# Patient Record
Sex: Female | Born: 1940
Health system: Southern US, Community
[De-identification: ages and names within clinical notes are randomized; demographics above are authoritative.]

## PROBLEM LIST (undated history)

## (undated) DIAGNOSIS — K219 Gastro-esophageal reflux disease without esophagitis: Secondary | ICD-10-CM

## (undated) DIAGNOSIS — E785 Hyperlipidemia, unspecified: Secondary | ICD-10-CM

## (undated) DIAGNOSIS — K223 Perforation of esophagus: Secondary | ICD-10-CM

## (undated) DIAGNOSIS — K760 Fatty (change of) liver, not elsewhere classified: Secondary | ICD-10-CM

## (undated) DIAGNOSIS — I10 Essential (primary) hypertension: Secondary | ICD-10-CM

## (undated) DIAGNOSIS — U071 COVID-19: Secondary | ICD-10-CM

## (undated) DIAGNOSIS — T753XXA Motion sickness, initial encounter: Secondary | ICD-10-CM

## (undated) DIAGNOSIS — K635 Polyp of colon: Secondary | ICD-10-CM

## (undated) DIAGNOSIS — T7840XA Allergy, unspecified, initial encounter: Secondary | ICD-10-CM

## (undated) DIAGNOSIS — J45909 Unspecified asthma, uncomplicated: Secondary | ICD-10-CM

## (undated) DIAGNOSIS — M1990 Unspecified osteoarthritis, unspecified site: Secondary | ICD-10-CM

## (undated) HISTORY — PX: TONSILLECTOMY AND ADENOIDECTOMY: SHX28

## (undated) HISTORY — DX: Polyp of colon: K63.5

## (undated) HISTORY — DX: Hyperlipidemia, unspecified: E78.5

## (undated) HISTORY — DX: COVID-19: U07.1

## (undated) HISTORY — DX: Allergy, unspecified, initial encounter: T78.40XA

## (undated) HISTORY — DX: Unspecified asthma, uncomplicated: J45.909

## (undated) HISTORY — DX: Essential (primary) hypertension: I10

## (undated) HISTORY — DX: Gastro-esophageal reflux disease without esophagitis: K21.9

---

## 1976-05-08 HISTORY — PX: ABDOMINAL HYSTERECTOMY: SHX81

## 2002-05-28 LAB — HM PAP SMEAR

## 2004-02-21 ENCOUNTER — Emergency Department: Payer: Self-pay | Admitting: Emergency Medicine

## 2005-01-07 ENCOUNTER — Emergency Department: Payer: Self-pay | Admitting: Emergency Medicine

## 2005-04-03 ENCOUNTER — Ambulatory Visit: Payer: Self-pay | Admitting: General Surgery

## 2005-04-03 LAB — HM COLONOSCOPY

## 2005-07-08 ENCOUNTER — Emergency Department: Payer: Self-pay | Admitting: Emergency Medicine

## 2005-07-08 ENCOUNTER — Other Ambulatory Visit: Payer: Self-pay

## 2005-12-14 ENCOUNTER — Emergency Department: Payer: Self-pay | Admitting: Emergency Medicine

## 2007-06-09 ENCOUNTER — Emergency Department: Payer: Self-pay | Admitting: Emergency Medicine

## 2007-06-22 ENCOUNTER — Emergency Department: Payer: Self-pay | Admitting: Internal Medicine

## 2007-12-10 ENCOUNTER — Ambulatory Visit: Payer: Self-pay | Admitting: Family Medicine

## 2008-08-28 ENCOUNTER — Emergency Department: Payer: Self-pay | Admitting: Emergency Medicine

## 2009-03-25 ENCOUNTER — Ambulatory Visit: Payer: Self-pay | Admitting: Family Medicine

## 2009-04-13 ENCOUNTER — Ambulatory Visit: Payer: Self-pay | Admitting: Family Medicine

## 2010-02-18 ENCOUNTER — Emergency Department: Payer: Self-pay | Admitting: Emergency Medicine

## 2010-03-01 LAB — HM DEXA SCAN: HM Dexa Scan: NORMAL

## 2010-03-27 ENCOUNTER — Emergency Department: Payer: Self-pay | Admitting: Emergency Medicine

## 2010-04-06 ENCOUNTER — Ambulatory Visit: Payer: Self-pay | Admitting: Unknown Physician Specialty

## 2010-05-05 ENCOUNTER — Ambulatory Visit: Payer: Self-pay

## 2010-07-04 ENCOUNTER — Emergency Department: Payer: Self-pay | Admitting: Emergency Medicine

## 2011-02-10 ENCOUNTER — Emergency Department: Payer: Self-pay | Admitting: Emergency Medicine

## 2011-02-25 ENCOUNTER — Emergency Department: Payer: Self-pay | Admitting: Emergency Medicine

## 2011-05-31 ENCOUNTER — Ambulatory Visit: Payer: Self-pay | Admitting: Family Medicine

## 2011-06-07 ENCOUNTER — Ambulatory Visit: Payer: Self-pay | Admitting: Family Medicine

## 2011-07-20 ENCOUNTER — Ambulatory Visit: Payer: Self-pay | Admitting: Unknown Physician Specialty

## 2012-08-06 ENCOUNTER — Ambulatory Visit: Payer: Self-pay | Admitting: Family Medicine

## 2013-12-17 ENCOUNTER — Ambulatory Visit: Payer: Self-pay | Admitting: Family Medicine

## 2013-12-17 LAB — HM MAMMOGRAPHY

## 2014-01-02 LAB — CBC AND DIFFERENTIAL
HEMATOCRIT: 39 % (ref 36–46)
Hemoglobin: 12.8 g/dL (ref 12.0–16.0)
PLATELETS: 101 10*3/uL — AB (ref 150–399)
WBC: 3.8 10*3/mL

## 2014-01-02 LAB — BASIC METABOLIC PANEL
BUN: 11 mg/dL (ref 4–21)
Creatinine: 0.7 mg/dL (ref 0.5–1.1)
Glucose: 158 mg/dL
POTASSIUM: 3.9 mmol/L (ref 3.4–5.3)
Sodium: 141 mmol/L (ref 137–147)

## 2014-01-02 LAB — HEMOGLOBIN A1C: Hgb A1c MFr Bld: 7.1 % — AB (ref 4.0–6.0)

## 2014-01-08 ENCOUNTER — Ambulatory Visit: Payer: Self-pay | Admitting: Family Medicine

## 2014-07-13 DIAGNOSIS — E785 Hyperlipidemia, unspecified: Secondary | ICD-10-CM | POA: Diagnosis not present

## 2014-07-13 DIAGNOSIS — D696 Thrombocytopenia, unspecified: Secondary | ICD-10-CM | POA: Diagnosis not present

## 2014-07-13 DIAGNOSIS — Z23 Encounter for immunization: Secondary | ICD-10-CM | POA: Diagnosis not present

## 2014-07-13 DIAGNOSIS — E119 Type 2 diabetes mellitus without complications: Secondary | ICD-10-CM | POA: Diagnosis not present

## 2014-07-13 DIAGNOSIS — K5781 Diverticulitis of intestine, part unspecified, with perforation and abscess with bleeding: Secondary | ICD-10-CM | POA: Diagnosis not present

## 2014-07-13 DIAGNOSIS — I10 Essential (primary) hypertension: Secondary | ICD-10-CM | POA: Diagnosis not present

## 2014-07-13 LAB — LIPID PANEL
CHOLESTEROL: 188 mg/dL (ref 0–200)
HDL: 38 mg/dL (ref 35–70)
LDL CALC: 127 mg/dL
TRIGLYCERIDES: 115 mg/dL (ref 40–160)

## 2014-07-13 LAB — HM DIABETES FOOT EXAM: HM DIABETIC FOOT EXAM: NORMAL

## 2014-07-13 LAB — BASIC METABOLIC PANEL
BUN: 12 mg/dL (ref 4–21)
Creatinine: 0.6 mg/dL (ref 0.5–1.1)
Glucose: 143 mg/dL
Potassium: 4.3 mmol/L (ref 3.4–5.3)
SODIUM: 140 mmol/L (ref 137–147)

## 2014-07-13 LAB — HEPATIC FUNCTION PANEL
ALT: 26 U/L (ref 7–35)
AST: 27 U/L (ref 13–35)

## 2014-07-13 LAB — CBC AND DIFFERENTIAL
HEMATOCRIT: 40 % (ref 36–46)
HEMOGLOBIN: 13.1 g/dL (ref 12.0–16.0)
PLATELETS: 111 10*3/uL — AB (ref 150–399)
WBC: 4 10*3/mL

## 2014-07-13 LAB — HEMOGLOBIN A1C: Hgb A1c MFr Bld: 7 % — AB (ref 4.0–6.0)

## 2014-09-22 DIAGNOSIS — N390 Urinary tract infection, site not specified: Secondary | ICD-10-CM | POA: Diagnosis not present

## 2014-09-22 DIAGNOSIS — K5781 Diverticulitis of intestine, part unspecified, with perforation and abscess with bleeding: Secondary | ICD-10-CM | POA: Diagnosis not present

## 2014-09-22 DIAGNOSIS — Z23 Encounter for immunization: Secondary | ICD-10-CM | POA: Diagnosis not present

## 2014-09-22 DIAGNOSIS — I10 Essential (primary) hypertension: Secondary | ICD-10-CM | POA: Diagnosis not present

## 2014-10-14 ENCOUNTER — Ambulatory Visit (INDEPENDENT_AMBULATORY_CARE_PROVIDER_SITE_OTHER): Payer: Commercial Managed Care - HMO | Admitting: Family Medicine

## 2014-10-14 ENCOUNTER — Ambulatory Visit
Admission: RE | Admit: 2014-10-14 | Discharge: 2014-10-14 | Disposition: A | Discharge status: Home / Self Care | Payer: Commercial Managed Care - HMO | Source: Ambulatory Visit | Attending: Family Medicine | Admitting: Family Medicine

## 2014-10-14 ENCOUNTER — Encounter: Payer: Self-pay | Admitting: Family Medicine

## 2014-10-14 ENCOUNTER — Telehealth: Payer: Self-pay

## 2014-10-14 VITALS — BP 118/72 | HR 80 | Temp 97.8°F | Resp 20 | Ht 61.75 in | Wt 182.0 lb

## 2014-10-14 DIAGNOSIS — E669 Obesity, unspecified: Secondary | ICD-10-CM | POA: Insufficient documentation

## 2014-10-14 DIAGNOSIS — M549 Dorsalgia, unspecified: Secondary | ICD-10-CM | POA: Insufficient documentation

## 2014-10-14 DIAGNOSIS — M25552 Pain in left hip: Secondary | ICD-10-CM | POA: Insufficient documentation

## 2014-10-14 DIAGNOSIS — J45909 Unspecified asthma, uncomplicated: Secondary | ICD-10-CM | POA: Insufficient documentation

## 2014-10-14 DIAGNOSIS — R740 Nonspecific elevation of levels of transaminase and lactic acid dehydrogenase [LDH]: Secondary | ICD-10-CM

## 2014-10-14 DIAGNOSIS — M25569 Pain in unspecified knee: Secondary | ICD-10-CM | POA: Insufficient documentation

## 2014-10-14 DIAGNOSIS — M5442 Lumbago with sciatica, left side: Secondary | ICD-10-CM

## 2014-10-14 DIAGNOSIS — IMO0002 Reserved for concepts with insufficient information to code with codable children: Secondary | ICD-10-CM | POA: Insufficient documentation

## 2014-10-14 DIAGNOSIS — D219 Benign neoplasm of connective and other soft tissue, unspecified: Secondary | ICD-10-CM | POA: Insufficient documentation

## 2014-10-14 DIAGNOSIS — K7581 Nonalcoholic steatohepatitis (NASH): Secondary | ICD-10-CM | POA: Insufficient documentation

## 2014-10-14 DIAGNOSIS — E785 Hyperlipidemia, unspecified: Secondary | ICD-10-CM | POA: Insufficient documentation

## 2014-10-14 DIAGNOSIS — K2100 Gastro-esophageal reflux disease with esophagitis, without bleeding: Secondary | ICD-10-CM

## 2014-10-14 DIAGNOSIS — G510 Bell's palsy: Secondary | ICD-10-CM | POA: Insufficient documentation

## 2014-10-14 DIAGNOSIS — K8020 Calculus of gallbladder without cholecystitis without obstruction: Secondary | ICD-10-CM | POA: Insufficient documentation

## 2014-10-14 DIAGNOSIS — E118 Type 2 diabetes mellitus with unspecified complications: Secondary | ICD-10-CM | POA: Insufficient documentation

## 2014-10-14 DIAGNOSIS — E559 Vitamin D deficiency, unspecified: Secondary | ICD-10-CM | POA: Insufficient documentation

## 2014-10-14 DIAGNOSIS — E119 Type 2 diabetes mellitus without complications: Secondary | ICD-10-CM

## 2014-10-14 DIAGNOSIS — R161 Splenomegaly, not elsewhere classified: Secondary | ICD-10-CM | POA: Insufficient documentation

## 2014-10-14 DIAGNOSIS — I10 Essential (primary) hypertension: Secondary | ICD-10-CM | POA: Insufficient documentation

## 2014-10-14 DIAGNOSIS — J309 Allergic rhinitis, unspecified: Secondary | ICD-10-CM | POA: Insufficient documentation

## 2014-10-14 MED ORDER — TRAMADOL HCL 50 MG PO TABS: 50.0000 mg | ORAL_TABLET | Freq: Four times a day (QID) | ORAL | Status: DC | PRN

## 2014-10-14 MED ORDER — PREDNISONE 10 MG PO TABS: 10.0000 mg | ORAL_TABLET | Freq: Every day | ORAL | Status: DC

## 2014-10-14 MED ORDER — OMEPRAZOLE 20 MG PO CPDR: 20.0000 mg | DELAYED_RELEASE_CAPSULE | Freq: Two times a day (BID) | ORAL | Status: DC

## 2014-10-14 MED ORDER — VITAMIN D (ERGOCALCIFEROL) 1.25 MG (50000 UNIT) PO CAPS: 50000.0000 [IU] | ORAL_CAPSULE | ORAL | Status: DC

## 2014-10-14 NOTE — Telephone Encounter (Signed)
-----   Message from Margarita Rana, MD sent at 10/14/2014 12:10 PM EDT ----- Normal hip Xray. Proceed with plan as discussed. Call if worsens or does not improve. Thanks.

## 2014-10-14 NOTE — Progress Notes (Deleted)
   Subjective:    Patient ID: Jennifer [name], female    DOB: July 30, 1940, 74 y.o.   MRN: [id number]  HPI    Review of Systems     Objective:   Physical Exam        Assessment & Plan:

## 2014-10-14 NOTE — Progress Notes (Signed)
Subjective:    Patient ID: Jennifer [name], female    DOB: 01-06-1941, 74 y.o.   MRN: [id number]  Hip Pain  There was no injury mechanism. The pain is present in the left hip. The quality of the pain is described as aching. The pain is at a severity of 10/10. The pain is moderate. The pain has been constant since onset. Associated symptoms include an inability to bear weight. Pertinent negatives include no loss of motion, loss of sensation, muscle weakness, numbness or tingling. Nothing aggravates the symptoms. She has tried heat and rest for the symptoms. The treatment provided no relief.   Really thinks the head made it worse.  Just hurts so bad.  Did try one of son's Tramadol. Did help.  Vitamin D Deficiency Patient is requesting an increase of her prescription of Vit D 50000 iu, pt reports that she is currently taking 2 capsules every month. Patient reports that she a test done and was told to increase her intake of Vitamin D.    Review of Systems  Constitutional: Negative.   Neurological: Negative for tingling and numbness.  Psychiatric/Behavioral: Negative.    Past Medical History  Diagnosis Date  . Allergy   . Asthma   . Diabetes mellitus without complication   . Hypertension   . Hyperlipidemia   . GERD (gastroesophageal reflux disease)    Past Surgical History  Procedure Laterality Date  . Tonsillectomy and adenoidectomy    . Abdominal hysterectomy  1978    reports that she quit smoking about 27 years ago. Her smoking use included Cigarettes. She has a 3 pack-year smoking history. She has never used smokeless tobacco. She reports that she does not drink alcohol or use illicit drugs. family history includes Alcohol abuse in her father; Cancer in her sister; Dementia in her mother; Lymphoma in her sister and sister. Allergies  Allergen Reactions  . Celecoxib   . Codeine   . Hydrocodone-Acetaminophen   . Nitrofurantoin Monohyd Macro   . Penicillins   . Sulfa Antibiotics    . Ciprofloxacin Rash   Blood pressure 118/72, pulse 80, temperature 97.8 F (36.6 C), resp. rate 20, height 5' 1.75" (1.568 m), weight 182 lb (82.555 kg).    Objective:   Physical Exam  Constitutional: She appears well-developed and well-nourished.  Cardiovascular: Normal rate and regular rhythm.   Pulmonary/Chest: Effort normal and breath sounds normal.  Musculoskeletal: Normal range of motion. She exhibits no tenderness (straight leg raise is negative).       Left hip: She exhibits normal range of motion, normal strength and no tenderness.          Assessment & Plan:   1. Esophagitis, reflux    Worsening. Patient advised to increase Omeprazole 20 mg as below. - omeprazole (PRILOSEC) 20 MG capsule; Take 1 capsule (20 mg total) by mouth 2 (two) times daily.  Dispense: 60 capsule; Refill: 5  2. Avitaminosis D    Not to goal. Patient advised to increase Vitamin D 50000 units as below. - Vitamin D, Ergocalciferol, (DRISDOL) 50000 UNITS CAPS capsule; Take 1 capsule (50,000 Units total) by mouth once a week.  Dispense: 12 capsule; Refill: 3  3. Left-sided low back pain with left-sided sciatica    New problem. Patient advised to start Prednisone 10 mg as below and Tramadol 50 mg as needed. Patient advised to call in pain or symptoms are worsening or not improving.  - DG Left hip with pelvis x-ray - traMADol (ULTRAM)  50 MG tablet; Take 1 tablet (50 mg total) by mouth every 6 (six) hours as needed.  Dispense: 30 tablet; Refill: 1 - prednisone (DELTASONE) 10 MG tablet; Take 1 tablet (10 mg total) by mouth daily with breakfast. 6 tablets for 2 days; 5 tablets for 2 days; 4 tablets for 2 days; 3 tablets for 2 days; 2 tablets for 2 days; 1 tablet for 2 days  Dispense: 42 tablet; Refill: 0  Patient seen and examined by Dr. Jerrell Belfast, and note scribed by Philbert Riser. Dimas, CMA.  I have reviewed the document for accuracy and completeness and I agree with above. Jerrell Belfast, MD

## 2014-10-14 NOTE — Telephone Encounter (Signed)
Pt advised as directed below.    Thanks,   -Mickel Baas

## 2014-10-21 ENCOUNTER — Telehealth: Payer: Self-pay | Admitting: Family Medicine

## 2014-10-21 DIAGNOSIS — M1990 Unspecified osteoarthritis, unspecified site: Secondary | ICD-10-CM

## 2014-10-21 NOTE — Telephone Encounter (Signed)
Pt is requesting referral to see Dr Precious Reel for leg and knee pain.She states she spoke with you about this at last office visit

## 2014-10-23 NOTE — Telephone Encounter (Signed)
Information faxed to Dr Precious Reel.His office will contact pt

## 2014-12-02 DIAGNOSIS — R7989 Other specified abnormal findings of blood chemistry: Secondary | ICD-10-CM | POA: Diagnosis not present

## 2014-12-02 DIAGNOSIS — G8929 Other chronic pain: Secondary | ICD-10-CM | POA: Diagnosis not present

## 2014-12-02 DIAGNOSIS — M25562 Pain in left knee: Secondary | ICD-10-CM | POA: Diagnosis not present

## 2014-12-02 DIAGNOSIS — M1990 Unspecified osteoarthritis, unspecified site: Secondary | ICD-10-CM | POA: Diagnosis not present

## 2014-12-25 ENCOUNTER — Other Ambulatory Visit: Payer: Self-pay | Admitting: Family Medicine

## 2014-12-25 ENCOUNTER — Other Ambulatory Visit: Payer: Self-pay

## 2014-12-25 DIAGNOSIS — E119 Type 2 diabetes mellitus without complications: Secondary | ICD-10-CM

## 2014-12-25 NOTE — Telephone Encounter (Signed)
Pt contacted office for refill request on the following medications:  glucose blood (ACCU-CHEK SMARTVIEW) test strip.  Pt states she is testing 2 times a day and is requesting additional test strips.  Sorrel.  CB#[phone]/MW

## 2014-12-25 NOTE — Telephone Encounter (Signed)
This is a pt of Dr. Sharyon Medicus. Next ov appointment is on 01/18/2015.  Thanks,

## 2014-12-28 MED ORDER — GLUCOSE BLOOD VI STRP: ORAL_STRIP | Status: DC

## 2014-12-28 NOTE — Telephone Encounter (Signed)
Please let patient know that insurance will not pay for two times daily unless on insulin. She will have to pay out of pocket for it. Does she still want it sent that way. Thanks.

## 2014-12-28 NOTE — Telephone Encounter (Signed)
Pt advised she would like to go ahead with the refill.  Thanks,   -Mickel Baas

## 2014-12-28 NOTE — Telephone Encounter (Signed)
Pt called to make sure this was sent to the pharmacy/MW

## 2014-12-29 MED ORDER — ACCU-CHEK NANO SMARTVIEW W/DEVICE KIT: 1.0000 | PACK | Freq: Every day | Status: DC

## 2014-12-31 ENCOUNTER — Ambulatory Visit (INDEPENDENT_AMBULATORY_CARE_PROVIDER_SITE_OTHER): Payer: Commercial Managed Care - HMO | Admitting: Family Medicine

## 2014-12-31 ENCOUNTER — Other Ambulatory Visit: Payer: Self-pay | Admitting: Family Medicine

## 2014-12-31 ENCOUNTER — Encounter: Payer: Self-pay | Admitting: Family Medicine

## 2014-12-31 VITALS — BP 120/66 | HR 80 | Temp 98.3°F | Resp 16 | Ht 62.0 in | Wt 183.0 lb

## 2014-12-31 DIAGNOSIS — N3001 Acute cystitis with hematuria: Secondary | ICD-10-CM | POA: Diagnosis not present

## 2014-12-31 DIAGNOSIS — N3000 Acute cystitis without hematuria: Secondary | ICD-10-CM | POA: Insufficient documentation

## 2014-12-31 DIAGNOSIS — M5442 Lumbago with sciatica, left side: Secondary | ICD-10-CM | POA: Diagnosis not present

## 2014-12-31 DIAGNOSIS — M1990 Unspecified osteoarthritis, unspecified site: Secondary | ICD-10-CM | POA: Diagnosis not present

## 2014-12-31 LAB — POCT URINALYSIS DIPSTICK
BILIRUBIN UA: NEGATIVE
GLUCOSE UA: NEGATIVE
KETONES UA: NEGATIVE
Nitrite, UA: NEGATIVE
Protein, UA: NEGATIVE
Spec Grav, UA: 1.01
Urobilinogen, UA: 0.2
pH, UA: 6

## 2014-12-31 MED ORDER — CIPROFLOXACIN HCL 250 MG PO TABS: 250.0000 mg | ORAL_TABLET | Freq: Two times a day (BID) | ORAL | Status: DC

## 2014-12-31 MED ORDER — TRAMADOL HCL 50 MG PO TABS: 50.0000 mg | ORAL_TABLET | Freq: Four times a day (QID) | ORAL | Status: DC | PRN

## 2014-12-31 MED ORDER — CIPROFLOXACIN HCL 500 MG PO TABS: 500.0000 mg | ORAL_TABLET | Freq: Two times a day (BID) | ORAL | Status: DC

## 2014-12-31 NOTE — Progress Notes (Signed)
Patient ID: Jennifer [name], female   DOB: 1940/12/31, 74 y.o.   MRN: [id number]        Patient: Jennifer [name] Female    DOB: 1940/06/13   74 y.o.   MRN: [id number] Visit Date: 12/31/2014  Today's Provider: Margarita Rana, MD   Chief Complaint  Patient presents with  . Urinary Tract Infection   Subjective:    Urinary Tract Infection  This is a new problem. The current episode started in the past 7 days. The problem occurs every urination. The problem has been unchanged. There has been no fever. Associated symptoms include chills, flank pain (yesterday), frequency and urgency. Pertinent negatives include no discharge, hematuria, nausea or vomiting. She has tried nothing for the symptoms.   Results for orders placed or performed in visit on 12/31/14  POCT urinalysis dipstick  Result Value Ref Range   Color, UA straw    Clarity, UA clear    Glucose, UA neg    Bilirubin, UA neg    Ketones, UA neg    Spec Grav, UA 1.010    Blood, UA non hemo trace    pH, UA 6.0    Protein, UA neg    Urobilinogen, UA 0.2    Nitrite, UA neg    Leukocytes, UA moderate (2+) (A) Negative        Allergies  Allergen Reactions  . Celecoxib   . Codeine   . Hydrocodone-Acetaminophen   . Nitrofurantoin Monohyd Macro   . Penicillins   . Sulfa Antibiotics   . Ciprofloxacin Rash   Previous Medications   ALCOHOL SWABS (B-D SINGLE USE SWABS REGULAR) PADS       BLOOD GLUCOSE CALIBRATION (ACCU-CHEK SMARTVIEW CONTROL) LIQD       BLOOD GLUCOSE MONITORING SUPPL (ACCU-CHEK NANO SMARTVIEW) W/DEVICE KIT    1 strip by Does not apply route daily.   GLUCOSE BLOOD (ACCU-CHEK SMARTVIEW) TEST STRIP    To check blood sugar twice a day.   LANCETS MISC. (ACCU-CHEK FASTCLIX LANCET) KIT       METFORMIN (GLUCOPHAGE) 500 MG TABLET    Take 500 mg by mouth 2 (two) times daily with a meal.    OMEPRAZOLE (PRILOSEC) 20 MG CAPSULE    Take 1 capsule (20 mg total) by mouth 2 (two) times daily.   QUINAPRIL (ACCUPRIL) 10 MG TABLET     Take 10 mg by mouth daily.    TRAMADOL (ULTRAM) 50 MG TABLET    Take 1 tablet (50 mg total) by mouth every 6 (six) hours as needed.   VITAMIN D, ERGOCALCIFEROL, (DRISDOL) 50000 UNITS CAPS CAPSULE    Take 1 capsule (50,000 Units total) by mouth once a week.    Review of Systems  Constitutional: Positive for chills.  Gastrointestinal: Negative for nausea and vomiting.  Genitourinary: Positive for urgency, frequency and flank pain (yesterday). Negative for hematuria.    Social History  Substance Use Topics  . Smoking status: Former Smoker -- 0.50 packs/day for 6 years    Types: Cigarettes    Quit date: 05/08/1987  . Smokeless tobacco: Never Used  . Alcohol Use: No   Objective:   BP 120/66 mmHg  Pulse 80  Temp(Src) 98.3 F (36.8 C) (Oral)  Resp 16  Ht 5' 2"  (1.575 m)  Wt 183 lb (83.008 kg)  BMI 33.46 kg/m2  Physical Exam  Constitutional: She appears well-developed and well-nourished. No distress.  Cardiovascular: Normal rate.   Pulmonary/Chest: Breath sounds normal.  Abdominal: Soft. Bowel sounds  are normal. She exhibits no distension and no mass. There is no rebound, no guarding and no CVA tenderness.      Assessment & Plan:     1. Acute cystitis with hematuria Worsening Will treat. Request Cipro. Says she is no longer allergic to medication. Would like to retry it.  Patient instructed to call back if condition worsens or does not improve.    - POCT urinalysis dipstick - Urine culture  2. Arthritis Condition is worsening. Will start medication for better control.   - traMADol (ULTRAM) 50 MG tablet; Take 1 tablet (50 mg total) by mouth every 6 (six) hours as needed.  Dispense: 30 tablet; Refill: [place], MD  [facility]

## 2015-01-01 LAB — PLEASE NOTE

## 2015-01-01 LAB — URINE CULTURE

## 2015-01-05 ENCOUNTER — Telehealth: Payer: Self-pay

## 2015-01-05 NOTE — Telephone Encounter (Signed)
-----   Message from Margarita Rana, MD sent at 01/01/2015  4:55 PM EDT ----- Berderline for infection. Please see how patient is doing. Thanks.

## 2015-01-05 NOTE — Telephone Encounter (Signed)
Pt advised, she reports that she is feeling better.     Thanks,   -Mickel Baas

## 2015-01-18 ENCOUNTER — Ambulatory Visit (INDEPENDENT_AMBULATORY_CARE_PROVIDER_SITE_OTHER): Payer: Commercial Managed Care - HMO | Admitting: Family Medicine

## 2015-01-18 ENCOUNTER — Other Ambulatory Visit: Payer: Self-pay

## 2015-01-18 ENCOUNTER — Telehealth: Payer: Self-pay | Admitting: Gastroenterology

## 2015-01-18 ENCOUNTER — Encounter: Payer: Self-pay | Admitting: Family Medicine

## 2015-01-18 VITALS — BP 120/60 | HR 72 | Temp 97.8°F | Resp 16 | Ht 62.0 in | Wt 182.0 lb

## 2015-01-18 DIAGNOSIS — I10 Essential (primary) hypertension: Secondary | ICD-10-CM

## 2015-01-18 DIAGNOSIS — E785 Hyperlipidemia, unspecified: Secondary | ICD-10-CM | POA: Diagnosis not present

## 2015-01-18 DIAGNOSIS — Z13820 Encounter for screening for osteoporosis: Secondary | ICD-10-CM | POA: Diagnosis not present

## 2015-01-18 DIAGNOSIS — Z23 Encounter for immunization: Secondary | ICD-10-CM | POA: Diagnosis not present

## 2015-01-18 DIAGNOSIS — E669 Obesity, unspecified: Secondary | ICD-10-CM

## 2015-01-18 DIAGNOSIS — E559 Vitamin D deficiency, unspecified: Secondary | ICD-10-CM | POA: Diagnosis not present

## 2015-01-18 DIAGNOSIS — E119 Type 2 diabetes mellitus without complications: Secondary | ICD-10-CM | POA: Diagnosis not present

## 2015-01-18 DIAGNOSIS — Z0000 Encounter for general adult medical examination without abnormal findings: Secondary | ICD-10-CM

## 2015-01-18 DIAGNOSIS — Z1239 Encounter for other screening for malignant neoplasm of breast: Secondary | ICD-10-CM | POA: Diagnosis not present

## 2015-01-18 DIAGNOSIS — Z1211 Encounter for screening for malignant neoplasm of colon: Secondary | ICD-10-CM | POA: Diagnosis not present

## 2015-01-18 NOTE — Progress Notes (Signed)
Patient ID: Jennifer [name], female   DOB: Feb 07, 1941, 74 y.o.   MRN: [id number]        Patient: Jennifer [name], Female    DOB: 12-29-1940, 74 y.o.   MRN: [id number] Visit Date: 01/18/2015  Today's Provider: Margarita Rana, MD   Chief Complaint  Patient presents with  . Medicare Wellness   Subjective:    Annual wellness visit Jennifer [name] is a 74 y.o. female. She feels well. She reports not exercising but stays very active with daily activities. She reports she is sleeping well.   02/24/10 CPE 05/28/02 Pap-WNL, total hysterectomy 12/17/13 Mammogram-normal 04/03/05 Colonoscopy-WNL 03/01/10 BMD-Normal 03/25/09 EKG  Lab Results  Component Value Date   WBC 4.0 07/13/2014   HGB 13.1 07/13/2014   HCT 40 07/13/2014   PLT 111* 07/13/2014   CHOL 188 07/13/2014   TRIG 115 07/13/2014   HDL 38 07/13/2014   LDLCALC 127 07/13/2014   ALT 26 07/13/2014   AST 27 07/13/2014   NA 140 07/13/2014   K 4.3 07/13/2014   CREATININE 0.6 07/13/2014   BUN 12 07/13/2014   HGBA1C 7.0* 07/13/2014       Review of Systems  Constitutional: Negative.   HENT: Negative.   Eyes: Negative.   Respiratory: Negative.   Cardiovascular: Negative.   Gastrointestinal: Negative.   Endocrine: Negative.   Genitourinary: Negative.   Musculoskeletal: Positive for arthralgias.  Skin: Negative.   Allergic/Immunologic: Negative.   Neurological: Negative.   Hematological: Negative.   Psychiatric/Behavioral: Negative.     Social History   Social History  . Marital Status: Widowed    Spouse Name: N/A  . Number of Children: N/A  . Years of Education: N/A   Occupational History  . Not on file.   Social History Main Topics  . Smoking status: Former Smoker -- 0.50 packs/day for 6 years    Types: Cigarettes    Quit date: 05/08/1987  . Smokeless tobacco: Never Used  . Alcohol Use: No  . Drug Use: No  . Sexual Activity: Not on file   Other Topics Concern  . Not on file   Social History  Narrative    Patient Active Problem List   Diagnosis Date Noted  . Acute cystitis 12/31/2014  . Arthritis 10/21/2014  . Allergic rhinitis 10/14/2014  . Airway hyperreactivity 10/14/2014  . Back ache 10/14/2014  . Bell palsy 10/14/2014  . Controlled diabetes mellitus type II without complication 31/51/7616  . Elevation of level of transaminase or lactic acid dehydrogenase (LDH) 10/14/2014  . Calculus of gallbladder 10/14/2014  . HLD (hyperlipidemia) 10/14/2014  . BP (high blood pressure) 10/14/2014  . Gonalgia 10/14/2014  . Benign neoplasm of soft tissues 10/14/2014  . NASH (nonalcoholic steatohepatitis) 10/14/2014  . Adiposity 10/14/2014  . Esophagitis, reflux 10/14/2014  . Enlargement of spleen 10/14/2014  . Change in blood platelet count 10/14/2014  . Avitaminosis D 10/14/2014    Past Surgical History  Procedure Laterality Date  . Tonsillectomy and adenoidectomy    . Abdominal hysterectomy  1978    Her family history includes Alcohol abuse in her father; Cancer in her sister; Dementia in her mother; Lymphoma in her sister and sister.    Previous Medications   ALCOHOL SWABS (B-D SINGLE USE SWABS REGULAR) PADS       BLOOD GLUCOSE CALIBRATION (ACCU-CHEK SMARTVIEW CONTROL) LIQD       BLOOD GLUCOSE MONITORING SUPPL (ACCU-CHEK NANO SMARTVIEW) W/DEVICE KIT    1 strip by Does not apply route  daily.   GLUCOSE BLOOD (ACCU-CHEK SMARTVIEW) TEST STRIP    To check blood sugar twice a day.   LANCETS MISC. (ACCU-CHEK FASTCLIX LANCET) KIT       METFORMIN (GLUCOPHAGE) 500 MG TABLET    Take 500 mg by mouth 2 (two) times daily with a meal.    OMEPRAZOLE (PRILOSEC) 20 MG CAPSULE    Take 1 capsule (20 mg total) by mouth 2 (two) times daily.   QUINAPRIL (ACCUPRIL) 10 MG TABLET    Take 10 mg by mouth daily.    TRAMADOL (ULTRAM) 50 MG TABLET    Take 1 tablet (50 mg total) by mouth every 6 (six) hours as needed.   VITAMIN D, ERGOCALCIFEROL, (DRISDOL) 50000 UNITS CAPS CAPSULE    Take 1 capsule  (50,000 Units total) by mouth once a week.    Patient Care Team: Margarita Rana, MD as PCP - General (Family Medicine)     Objective:   Vitals: BP 120/60 mmHg  Pulse 72  Temp(Src) 97.8 F (36.6 C) (Oral)  Resp 16  Ht _0  (1.575 m)  Wt 182 lb (82.555 kg)  BMI 33.28 kg/m2  SpO2 99%  Physical Exam  Constitutional: She is oriented to person, place, and time. She appears well-developed and well-nourished.  HENT:  Head: Normocephalic and atraumatic.  Right Ear: Tympanic membrane, external ear and ear canal normal.  Left Ear: Tympanic membrane, external ear and ear canal normal.  Nose: Nose normal.  Mouth/Throat: Uvula is midline, oropharynx is clear and moist and mucous membranes are normal.  Eyes: Conjunctivae, EOM and lids are normal. Pupils are equal, round, and reactive to light.  Neck: Trachea normal and normal range of motion. Neck supple. Carotid bruit is not present. No thyroid mass and no thyromegaly present.  Cardiovascular: Normal rate, regular rhythm and normal heart sounds.   Pulmonary/Chest: Effort normal and breath sounds normal.  Abdominal: Soft. Normal appearance and bowel sounds are normal. There is no hepatosplenomegaly. There is no tenderness.  Genitourinary: No breast swelling, tenderness or discharge.  Musculoskeletal: Normal range of motion.  Lymphadenopathy:    She has no cervical adenopathy.    She has no axillary adenopathy.  Neurological: She is alert and oriented to person, place, and time. She has normal strength. No cranial nerve deficit.  Skin: Skin is warm, dry and intact.  Psychiatric: She has a normal mood and affect. Her speech is normal and behavior is normal. Judgment and thought content normal. Cognition and memory are normal.   Diabetic Foot Exam - Simple   Simple Foot Form  Diabetic Foot exam was performed with the following findings:  Yes 01/18/2015  9:54 AM  Visual Inspection  No deformities, no ulcerations, no other skin breakdown  bilaterally:  Yes  Sensation Testing  Intact to touch and monofilament testing bilaterally:  Yes  Pulse Check  Posterior Tibialis and Dorsalis pulse intact bilaterally:  Yes  Comments      Activities of Daily Living In your present state of health, do you have any difficulty performing the following activities: 01/18/2015  Hearing? N  Vision? N  Difficulty concentrating or making decisions? N  Walking or climbing stairs? N  Dressing or bathing? N  Doing errands, shopping? N    Fall Risk Assessment Fall Risk  01/18/2015 10/14/2014  Falls in the past year? No No     Depression Screen PHQ 2/9 Scores 01/18/2015 10/14/2014  PHQ - 2 Score 0 0    Cognitive Testing - 6-CIT  Correct? Score   What year is it? yes 0 0 or 4  What month is it? yes 0 0 or 3  Memorize:    Pia Mau,  41,  [address]  [place],      What time is it? (within 1 hour) yes 0 0 or 3  Count backwards from 20 yes 0 0, 2, or 4  Name the months of the year yes 0 0, 2, or 4  Repeat name & address above yes 0 0, 2, 4, 6, 8, or 10       TOTAL SCORE  0/28   Interpretation:  Normal  Normal (0-7) Abnormal (8-28)   History  Alcohol Use No        Assessment & Plan:     Annual Wellness Visit  Reviewed patient's Family Medical History Reviewed and updated list of patient's medical providers Assessment of cognitive impairment was done Assessed patient's functional ability Established a written schedule for health screening [place] Completed and Reviewed  Exercise Activities and Dietary recommendations Goals    . Exercise 150 minutes per week (moderate activity)       Immunization History  Administered Date(s) Administered  . Hepatitis A 07/10/2011, 12/25/2011  . Hepatitis B 07/10/2011, 08/11/2011, 12/25/2011  . Influenza, High Dose Seasonal PF 01/18/2015  . Pneumococcal Conjugate-13 07/13/2014  . Pneumococcal Polysaccharide-23 01/27/2009  . Td 04/08/1996  . Tdap 02/24/2010     Health Maintenance  Topic Date Due  . FOOT EXAM  12/12/1949  . OPHTHALMOLOGY EXAM  12/12/1949  . URINE MICROALBUMIN  12/12/1949  . ZOSTAVAX  12/13/1999  . DEXA SCAN  12/12/2004  . HEMOGLOBIN A1C  07/05/2014  . INFLUENZA VACCINE  12/07/2014  . COLONOSCOPY  04/04/2015  . TETANUS/TDAP  02/25/2020  . PNA vac Low Risk Adult  Completed    1. Annual physical exam Stable. Patient advised to continue eating healthy and exercise daily.  2. Controlled diabetes mellitus type II without complication F/U pending lab report. - Hemoglobin A1c - Microalbumin, urine  3. Essential hypertension F/U pending lab report. - Comprehensive metabolic panel  4. Breast cancer screening - MM DIGITAL SCREENING BILATERAL; Future  5. Colon cancer screening - Ambulatory referral to Gastroenterology  6. Obesity Continue to eat healthy and exercise.   7. Need for influenza vaccination - Flu vaccine HIGH DOSE PF  8. Encounter for screening for osteoporosis - DG Bone Density; Future       Patient seen and examined by Dr. Jerrell Belfast, and note scribed by Philbert Riser. Dimas, CMA. I have reviewed the document for accuracy and completeness and I agree with above. Jerrell Belfast, MD   Margarita Rana, MD        ------------------------------------------------------------------------------------------------------------

## 2015-01-18 NOTE — Telephone Encounter (Signed)
Gastroenterology Pre-Procedure Review  Request Date: 02-01-2015 Requesting Physician: Dr.Maloney  PATIENT REVIEW QUESTIONS: The patient responded to the following health history questions as indicated:    1. Are you having any GI issues? no 2. Do you have a personal history of Polyps? no 3. Do you have a family history of Colon Cancer or Polyps? yes (Sister Polyps) 4. Diabetes Mellitus? yes ( ) 5. Joint replacements in the past 12 months?no 6. Major health problems in the past 3 months?no 7. Any artificial heart valves, MVP, or defibrillator?no    MEDICATIONS & ALLERGIES:    Patient reports the following regarding taking any anticoagulation/antiplatelet therapy:   Plavix, Coumadin, Eliquis, Xarelto, Lovenox, Pradaxa, Brilinta, or Effient? no Aspirin? no  Patient confirms/reports the following medications:  Current Outpatient Prescriptions  Medication Sig Dispense Refill   Alcohol Swabs (B-D SINGLE USE SWABS REGULAR) PADS      Blood Glucose Calibration (ACCU-CHEK SMARTVIEW CONTROL) LIQD      Blood Glucose Monitoring Suppl (ACCU-CHEK NANO SMARTVIEW) W/DEVICE KIT 1 strip by Does not apply route daily. 1 kit 6   glucose blood (ACCU-CHEK SMARTVIEW) test strip To check blood sugar twice a day. 200 each 3   Lancets Misc. (ACCU-CHEK FASTCLIX LANCET) KIT      metFORMIN (GLUCOPHAGE) 500 MG tablet Take 500 mg by mouth 2 (two) times daily with a meal.      omeprazole (PRILOSEC) 20 MG capsule Take 1 capsule (20 mg total) by mouth 2 (two) times daily. 60 capsule 5   quinapril (ACCUPRIL) 10 MG tablet Take 10 mg by mouth daily.      traMADol (ULTRAM) 50 MG tablet Take 1 tablet (50 mg total) by mouth every 6 (six) hours as needed. 30 tablet 1   Vitamin D, Ergocalciferol, (DRISDOL) 50000 UNITS CAPS capsule Take 1 capsule (50,000 Units total) by mouth once a week. 12 capsule 3   No current facility-administered medications for this visit.    Patient confirms/reports the following  allergies:  Allergies  Allergen Reactions   Celecoxib    Codeine    Hydrocodone-Acetaminophen    Nitrofurantoin Monohyd Macro    Penicillins    Sulfa Antibiotics     No orders of the defined types were placed in this encounter.    AUTHORIZATION INFORMATION Primary Insurance: 1D#: Group #:  Secondary Insurance: 1D#: Group #:  SCHEDULE INFORMATION: Date:02-01-2015  Time: [id number]

## 2015-01-19 ENCOUNTER — Telehealth: Payer: Self-pay

## 2015-01-19 DIAGNOSIS — M25562 Pain in left knee: Secondary | ICD-10-CM | POA: Diagnosis not present

## 2015-01-19 DIAGNOSIS — G8929 Other chronic pain: Secondary | ICD-10-CM | POA: Diagnosis not present

## 2015-01-19 DIAGNOSIS — M150 Primary generalized (osteo)arthritis: Secondary | ICD-10-CM | POA: Diagnosis not present

## 2015-01-19 LAB — COMPREHENSIVE METABOLIC PANEL
A/G RATIO: 1.8 (ref 1.1–2.5)
ALBUMIN: 4.2 g/dL (ref 3.5–4.8)
ALK PHOS: 63 IU/L (ref 39–117)
ALT: 24 IU/L (ref 0–32)
AST: 27 IU/L (ref 0–40)
BILIRUBIN TOTAL: 0.5 mg/dL (ref 0.0–1.2)
BUN / CREAT RATIO: 18 (ref 11–26)
BUN: 11 mg/dL (ref 8–27)
CHLORIDE: 98 mmol/L (ref 97–108)
CO2: 30 mmol/L — ABNORMAL HIGH (ref 18–29)
CREATININE: 0.62 mg/dL (ref 0.57–1.00)
Calcium: 9.6 mg/dL (ref 8.7–10.3)
GFR calc Af Amer: 102 mL/min/{1.73_m2} (ref >59)
GFR calc non Af Amer: 88 mL/min/{1.73_m2} (ref >59)
GLOBULIN, TOTAL: 2.4 g/dL (ref 1.5–4.5)
Glucose: 130 mg/dL — ABNORMAL HIGH (ref 65–99)
POTASSIUM: 5.2 mmol/L (ref 3.5–5.2)
SODIUM: 142 mmol/L (ref 134–144)
Total Protein: 6.6 g/dL (ref 6.0–8.5)

## 2015-01-19 LAB — HEMOGLOBIN A1C
Est. average glucose Bld gHb Est-mCnc: 151 mg/dL
HEMOGLOBIN A1C: 6.9 % — AB (ref 4.8–5.6)

## 2015-01-19 LAB — MICROALBUMIN, URINE: Microalbumin, Urine: 13.5 ug/mL

## 2015-01-19 NOTE — Telephone Encounter (Signed)
-----   Message from Margarita Rana, MD sent at 01/19/2015  2:00 PM EDT ----- Labs stable. Blood sugar is 6.9. Recommend recheck in 3 months. Thanks.

## 2015-01-19 NOTE — Telephone Encounter (Signed)
Advised pt of lab results. Pt verbally acknowledges understanding. Emily Drozdowski, CMA   

## 2015-01-21 ENCOUNTER — Telehealth: Payer: Self-pay | Admitting: Family Medicine

## 2015-01-21 NOTE — Telephone Encounter (Signed)
Pt wanted to know if we had received her liver test results. Thanks TNP

## 2015-01-22 NOTE — Telephone Encounter (Signed)
Liver enzymes where normal last check. Sorry for not mentioning. Thanks.

## 2015-01-22 NOTE — Telephone Encounter (Signed)
Advised patient as below.  

## 2015-01-25 ENCOUNTER — Encounter: Payer: Self-pay | Admitting: *Deleted

## 2015-01-27 ENCOUNTER — Ambulatory Visit
Admission: RE | Admit: 2015-01-27 | Discharge: 2015-01-27 | Disposition: A | Discharge status: Home / Self Care | Payer: Commercial Managed Care - HMO | Source: Ambulatory Visit | Attending: Family Medicine | Admitting: Family Medicine

## 2015-01-27 ENCOUNTER — Other Ambulatory Visit: Payer: Self-pay | Admitting: Family Medicine

## 2015-01-27 DIAGNOSIS — Z1239 Encounter for other screening for malignant neoplasm of breast: Secondary | ICD-10-CM

## 2015-01-27 DIAGNOSIS — Z780 Asymptomatic menopausal state: Secondary | ICD-10-CM | POA: Diagnosis not present

## 2015-01-27 DIAGNOSIS — Z1231 Encounter for screening mammogram for malignant neoplasm of breast: Secondary | ICD-10-CM | POA: Insufficient documentation

## 2015-01-27 DIAGNOSIS — Z13820 Encounter for screening for osteoporosis: Secondary | ICD-10-CM | POA: Diagnosis not present

## 2015-01-27 DIAGNOSIS — M85862 Other specified disorders of bone density and structure, left lower leg: Secondary | ICD-10-CM | POA: Insufficient documentation

## 2015-01-27 DIAGNOSIS — M85852 Other specified disorders of bone density and structure, left thigh: Secondary | ICD-10-CM | POA: Diagnosis not present

## 2015-01-28 ENCOUNTER — Telehealth: Payer: Self-pay

## 2015-01-28 NOTE — Telephone Encounter (Signed)
-----   Message from Margarita Rana, MD sent at 01/28/2015  1:38 PM EDT ----- Bone thinning, but not osteoporosis. Recheck in 2 years. Thanks.

## 2015-01-28 NOTE — Telephone Encounter (Signed)
Pt advised.   Thanks,   -Laura  

## 2015-01-29 NOTE — Discharge Instructions (Signed)
General Anesthesia, Care After Refer to this sheet in the next few weeks. These instructions provide you with information on caring for yourself after your procedure. Your health care provider may also give you more specific instructions. Your treatment has been planned according to current medical practices, but problems sometimes occur. Call your health care provider if you have any problems or questions after your procedure. WHAT TO EXPECT AFTER THE PROCEDURE After the procedure, it is typical to experience:  Sleepiness.  Nausea and vomiting. HOME CARE INSTRUCTIONS  For the first 24 hours after general anesthesia:  Have a responsible person with you.  Do not drive a car. If you are alone, do not take public transportation.  Do not drink alcohol.  Do not take medicine that has not been prescribed by your health care provider.  Do not sign important papers or make important decisions.  You may resume a normal diet and activities as directed by your health care provider.  Change bandages (dressings) as directed.  If you have questions or problems that seem related to general anesthesia, call the hospital and ask for the anesthetist or anesthesiologist on call. SEEK MEDICAL CARE IF:  You have nausea and vomiting that continue the day after anesthesia.  You develop a rash. SEEK IMMEDIATE MEDICAL CARE IF:   You have difficulty breathing.  You have chest pain.  You have any allergic problems. Document Released: 07/31/2000 Document Revised: 04/29/2013 Document Reviewed: 11/07/2012 [facility] Patient Information 2015 [place], [place]. This information is not intended to replace advice given to you by your health care provider. Make sure you discuss any questions you have with your health care provider.

## 2015-02-01 ENCOUNTER — Encounter
Admission: RE | Disposition: A | Discharge status: Home / Self Care | Payer: Self-pay | Source: Ambulatory Visit | Attending: Gastroenterology

## 2015-02-01 ENCOUNTER — Ambulatory Visit: Payer: Commercial Managed Care - HMO | Admitting: Anesthesiology

## 2015-02-01 ENCOUNTER — Other Ambulatory Visit: Payer: Self-pay | Admitting: Gastroenterology

## 2015-02-01 ENCOUNTER — Ambulatory Visit
Admission: RE | Admit: 2015-02-01 | Discharge: 2015-02-01 | Disposition: A | Discharge status: Home / Self Care | Payer: Commercial Managed Care - HMO | Source: Ambulatory Visit | Attending: Gastroenterology | Admitting: Gastroenterology

## 2015-02-01 DIAGNOSIS — E785 Hyperlipidemia, unspecified: Secondary | ICD-10-CM | POA: Diagnosis not present

## 2015-02-01 DIAGNOSIS — K649 Unspecified hemorrhoids: Secondary | ICD-10-CM | POA: Diagnosis not present

## 2015-02-01 DIAGNOSIS — Z90710 Acquired absence of both cervix and uterus: Secondary | ICD-10-CM | POA: Diagnosis not present

## 2015-02-01 DIAGNOSIS — K760 Fatty (change of) liver, not elsewhere classified: Secondary | ICD-10-CM | POA: Diagnosis not present

## 2015-02-01 DIAGNOSIS — M1990 Unspecified osteoarthritis, unspecified site: Secondary | ICD-10-CM | POA: Insufficient documentation

## 2015-02-01 DIAGNOSIS — Z1211 Encounter for screening for malignant neoplasm of colon: Secondary | ICD-10-CM | POA: Insufficient documentation

## 2015-02-01 DIAGNOSIS — D123 Benign neoplasm of transverse colon: Secondary | ICD-10-CM | POA: Insufficient documentation

## 2015-02-01 DIAGNOSIS — D125 Benign neoplasm of sigmoid colon: Secondary | ICD-10-CM | POA: Diagnosis not present

## 2015-02-01 DIAGNOSIS — Z882 Allergy status to sulfonamides status: Secondary | ICD-10-CM | POA: Insufficient documentation

## 2015-02-01 DIAGNOSIS — I10 Essential (primary) hypertension: Secondary | ICD-10-CM | POA: Diagnosis not present

## 2015-02-01 DIAGNOSIS — K219 Gastro-esophageal reflux disease without esophagitis: Secondary | ICD-10-CM | POA: Insufficient documentation

## 2015-02-01 DIAGNOSIS — Z87891 Personal history of nicotine dependence: Secondary | ICD-10-CM | POA: Diagnosis not present

## 2015-02-01 DIAGNOSIS — Z885 Allergy status to narcotic agent status: Secondary | ICD-10-CM | POA: Insufficient documentation

## 2015-02-01 DIAGNOSIS — E119 Type 2 diabetes mellitus without complications: Secondary | ICD-10-CM | POA: Insufficient documentation

## 2015-02-01 DIAGNOSIS — D122 Benign neoplasm of ascending colon: Secondary | ICD-10-CM | POA: Diagnosis not present

## 2015-02-01 DIAGNOSIS — J45909 Unspecified asthma, uncomplicated: Secondary | ICD-10-CM | POA: Diagnosis not present

## 2015-02-01 DIAGNOSIS — K641 Second degree hemorrhoids: Secondary | ICD-10-CM | POA: Insufficient documentation

## 2015-02-01 DIAGNOSIS — Z888 Allergy status to other drugs, medicaments and biological substances status: Secondary | ICD-10-CM | POA: Insufficient documentation

## 2015-02-01 DIAGNOSIS — Z880 Allergy status to penicillin: Secondary | ICD-10-CM | POA: Insufficient documentation

## 2015-02-01 DIAGNOSIS — K635 Polyp of colon: Secondary | ICD-10-CM | POA: Diagnosis not present

## 2015-02-01 HISTORY — DX: Unspecified osteoarthritis, unspecified site: M19.90

## 2015-02-01 HISTORY — PX: COLONOSCOPY WITH PROPOFOL: SHX5780

## 2015-02-01 HISTORY — DX: Fatty (change of) liver, not elsewhere classified: K76.0

## 2015-02-01 HISTORY — DX: Motion sickness, initial encounter: T75.3XXA

## 2015-02-01 HISTORY — PX: POLYPECTOMY: SHX5525

## 2015-02-01 LAB — GLUCOSE, CAPILLARY
GLUCOSE-CAPILLARY: 146 mg/dL — AB (ref 65–99)
Glucose-Capillary: 156 mg/dL — ABNORMAL HIGH (ref 65–99)

## 2015-02-01 SURGERY — COLONOSCOPY WITH PROPOFOL
Anesthesia: Monitor Anesthesia Care | Wound class: Contaminated

## 2015-02-01 MED ORDER — PROPOFOL 10 MG/ML IV BOLUS
INTRAVENOUS | Status: DC | PRN
  Administered 2015-02-01 (×2): 20 mg via INTRAVENOUS
  Administered 2015-02-01: 10 mg via INTRAVENOUS
  Administered 2015-02-01 (×4): 20 mg via INTRAVENOUS
  Administered 2015-02-01: 40 mg via INTRAVENOUS
  Administered 2015-02-01: 10 mg via INTRAVENOUS
  Administered 2015-02-01: 20 mg via INTRAVENOUS

## 2015-02-01 MED ORDER — LIDOCAINE HCL (CARDIAC) 20 MG/ML IV SOLN
INTRAVENOUS | Status: DC | PRN
  Administered 2015-02-01: 50 mg via INTRAVENOUS

## 2015-02-01 MED ORDER — LACTATED RINGERS IV SOLN
INTRAVENOUS | Status: DC
  Administered 2015-02-01: 08:00:00 via INTRAVENOUS

## 2015-02-01 MED ORDER — SIMETHICONE 40 MG/0.6ML PO SUSP
ORAL | Status: DC | PRN
  Administered 2015-02-01: 08:00:00

## 2015-02-01 SURGICAL SUPPLY — 28 items
CANISTER SUCT 1200ML W/VALVE (MISCELLANEOUS) ×4 IMPLANT
FCP ESCP3.2XJMB 240X2.8X (MISCELLANEOUS)
FORCEPS BIOP RAD 4 LRG CAP 4 (CUTTING FORCEPS) IMPLANT
FORCEPS BIOP RJ4 240 W/NDL (MISCELLANEOUS)
FORCEPS ESCP3.2XJMB 240X2.8X (MISCELLANEOUS) IMPLANT
GOWN CVR UNV OPN BCK APRN NK (MISCELLANEOUS) ×4 IMPLANT
GOWN ISOL THUMB LOOP REG UNIV (MISCELLANEOUS) ×4
HEMOCLIP INSTINCT (CLIP) ×12 IMPLANT
INJECTOR VARIJECT VIN23 (MISCELLANEOUS) IMPLANT
KIT CO2 TUBING (TUBING) IMPLANT
KIT DEFENDO VALVE AND CONN (KITS) IMPLANT
KIT ENDO PROCEDURE OLY (KITS) ×4 IMPLANT
LIGATOR MULTIBAND 6SHOOTER MBL (MISCELLANEOUS) IMPLANT
MARKER SPOT ENDO TATTOO 5ML (MISCELLANEOUS) IMPLANT
PAD GROUND ADULT SPLIT (MISCELLANEOUS) IMPLANT
SNARE SHORT THROW 13M SML OVAL (MISCELLANEOUS) ×4 IMPLANT
SNARE SHORT THROW 30M LRG OVAL (MISCELLANEOUS) IMPLANT
SPOT EX ENDOSCOPIC TATTOO (MISCELLANEOUS)
SUCTION POLY TRAP 4CHAMBER (MISCELLANEOUS) IMPLANT
TRAP SUCTION POLY (MISCELLANEOUS) ×4 IMPLANT
TUBING CONN 6MMX3.1M (TUBING)
TUBING SUCTION CONN 0.25 STRL (TUBING) IMPLANT
UNDERPAD 30X60 958B10 (PK) (MISCELLANEOUS) IMPLANT
VALVE BIOPSY ENDO (VALVE) IMPLANT
VARIJECT INJECTOR VIN23 (MISCELLANEOUS)
WATER AUXILLARY (MISCELLANEOUS) IMPLANT
WATER STERILE IRR 250ML POUR (IV SOLUTION) ×4 IMPLANT
WATER STERILE IRR 500ML POUR (IV SOLUTION) IMPLANT

## 2015-02-01 NOTE — Anesthesia Procedure Notes (Signed)
Procedure Name: MAC Performed by: AMYOT, MICHAEL Pre-anesthesia Checklist: Patient identified, Emergency Drugs available, Suction available, Timeout performed and Patient being monitored Patient Re-evaluated:Patient Re-evaluated prior to inductionOxygen Delivery Method: Nasal cannula Placement Confirmation: positive ETCO2       

## 2015-02-01 NOTE — Anesthesia Postprocedure Evaluation (Signed)
  Anesthesia Post-op Note  Patient: Jennifer [name]  Procedure(s) Performed: Procedure(s) with comments: COLONOSCOPY WITH PROPOFOL (N/A) - Diabetic - oral meds POLYPECTOMY  Anesthesia type:MAC  Patient location: PACU  Post pain: Pain level controlled  Post assessment: Post-op Vital signs reviewed, Patient's Cardiovascular Status Stable, Respiratory Function Stable, Patent Airway and No signs of Nausea or vomiting  Post vital signs: Reviewed and stable  Last Vitals:  Filed Vitals:   02/01/15 0826  BP: 144/77  Pulse:   Temp: 36.5 C  Resp: 21    Level of consciousness: awake, alert  and patient cooperative  Complications: No apparent anesthesia complications

## 2015-02-01 NOTE — Op Note (Signed)
[facility] Patient Name: Jennifer [name] Procedure Date: 02/01/2015 7:54 AM MRN: [id number] Account #: [id number] Date of Birth: 1940-10-05 Admit Type: Outpatient Age: 74 Room: [facility] OR ROOM 01 Gender: Female Note Status: Finalized Procedure:         Colonoscopy Indications:       Screening for colorectal malignant neoplasm Providers:         Lucilla Lame, MD Referring MD:      Jerrell Belfast, MD (Referring MD) Medicines:         Propofol per Anesthesia Complications:     No immediate complications. Procedure:         Pre-Anesthesia Assessment:                    - Prior to the procedure, a History and Physical was                     performed, and patient medications and allergies were                     reviewed. The patient's tolerance of previous anesthesia                     was also reviewed. The risks and benefits of the procedure                     and the sedation options and risks were discussed with the                     patient. All questions were answered, and informed consent                     was obtained. Prior Anticoagulants: The patient has taken                     no previous anticoagulant or antiplatelet agents. ASA                     Grade Assessment: III - A patient with severe systemic                     disease. After reviewing the risks and benefits, the                     patient was deemed in satisfactory condition to undergo                     the procedure.                    After obtaining informed consent, the colonoscope was                     passed under direct vision. Throughout the procedure, the                     patient's blood pressure, pulse, and oxygen saturations                     were monitored continuously. The was introduced through                     the anus and advanced to the the cecum, identified by  appendiceal orifice and ileocecal valve. The colonoscopy                  was performed without difficulty. The patient tolerated                     the procedure well. The quality of the bowel preparation                     was excellent. Findings:      The perianal and digital rectal examinations were normal.      A 8 mm polyp was found in the ascending colon. The polyp was       semi-sessile. The polyp was removed with a cold snare. Resection and       retrieval were complete. Two hemostatic clips were successfully placed       (MRI compatible). Bleeding had stopped at the end of the procedure.      A 4 mm polyp was found in the transverse colon. The polyp was sessile.       The polyp was removed with a cold biopsy forceps. Resection and       retrieval were complete.      A 7 mm polyp was found in the sigmoid colon. The polyp was sessile. The       polyp was removed with a cold snare. Resection and retrieval were       complete. One hemostatic clip was successfully placed (MRI compatible).       Bleeding had stopped at the end of the procedure.      Non-bleeding internal hemorrhoids were found during retroflexion. The       hemorrhoids were Grade II (internal hemorrhoids that prolapse but reduce       spontaneously). Impression:        - One 8 mm polyp in the ascending colon. Resected and                     retrieved. Clips were placed.                    - One 4 mm polyp in the transverse colon. Resected and                     retrieved.                    - One 7 mm polyp in the sigmoid colon. Resected and                     retrieved. Clip was placed.                    - Non-bleeding internal hemorrhoids. Recommendation:    - Await pathology results. Procedure Code(s): --- Professional ---                    [phone], Colonoscopy, flexible; with removal of tumor(s),                     polyp(s), or other lesion(s) by snare technique                    45380, 59, Colonoscopy, flexible; with biopsy, single or                      multiple Diagnosis Code(s): ---  Professional ---                    Z12.11, Encounter for screening for malignant neoplasm of                     colon                    D12.2, Benign neoplasm of ascending colon                    D12.3, Benign neoplasm of transverse colon                    D12.5, Benign neoplasm of sigmoid colon CPT copyright 2014 [employer]. All rights reserved. The codes documented in this report are preliminary and upon coder review may  be revised to meet current compliance requirements. Lucilla Lame, MD 02/01/2015 8:23:47 AM This report has been signed electronically. Number of Addenda: 0 Note Initiated On: 02/01/2015 7:54 AM Total Procedure Duration: 0 hours 17 minutes 55 seconds       [facility]

## 2015-02-01 NOTE — H&P (Signed)
[facility]  [address], [place], [place] [address] Phone: [phone] Fax : [phone]  Primary Care Physician:  Margarita Rana, MD Primary Gastroenterologist:  Dr. Allen Norris  Pre-Procedure History & Physical: HPI:  Jennifer [name] is a 74 y.o. female is here for a screening colonoscopy.   Past Medical History  Diagnosis Date  . Allergy   . Asthma   . Diabetes mellitus without complication   . Hypertension   . Hyperlipidemia   . GERD (gastroesophageal reflux disease)   . Fatty liver   . Arthritis   . Motion sickness     boats    Past Surgical History  Procedure Laterality Date  . Tonsillectomy and adenoidectomy    . Abdominal hysterectomy  1978    Prior to Admission medications   Medication Sig Start Date End Date Taking? Authorizing Provider  Alcohol Swabs (B-D SINGLE USE SWABS REGULAR) PADS  07/24/14  Yes Historical Provider, MD  Blood Glucose Calibration (ACCU-CHEK SMARTVIEW CONTROL) LIQD  07/24/14  Yes Historical Provider, MD  Blood Glucose Monitoring Suppl (ACCU-CHEK NANO SMARTVIEW) W/DEVICE KIT 1 strip by Does not apply route daily. 12/29/14  Yes Margarita Rana, MD  glucose blood (ACCU-CHEK SMARTVIEW) test strip To check blood sugar twice a day. 12/28/14  Yes Margarita Rana, MD  Lancets Misc. (ACCU-CHEK FASTCLIX LANCET) KIT  09/28/14  Yes Historical Provider, MD  metFORMIN (GLUCOPHAGE) 500 MG tablet Take 500 mg by mouth 2 (two) times daily with a meal.  07/20/14  Yes Historical Provider, MD  MILK THISTLE PO Take by mouth.   Yes Historical Provider, MD  omeprazole (PRILOSEC) 20 MG capsule Take 1 capsule (20 mg total) by mouth 2 (two) times daily. 10/14/14  Yes Margarita Rana, MD  quinapril (ACCUPRIL) 10 MG tablet Take 10 mg by mouth daily.  07/20/14  Yes Historical Provider, MD  TURMERIC CURCUMIN PO Take by mouth.   Yes Historical Provider, MD  Vitamin D, Ergocalciferol, (DRISDOL) 50000 UNITS CAPS capsule Take 1 capsule (50,000 Units total) by mouth once a  week. 10/14/14  Yes Margarita Rana, MD  traMADol (ULTRAM) 50 MG tablet Take 1 tablet (50 mg total) by mouth every 6 (six) hours as needed. Patient not taking: Reported on 02/01/2015 12/31/14   Margarita Rana, MD    Allergies as of 01/18/2015 - Review Complete 01/18/2015  Allergen Reaction Noted  . Celecoxib  10/14/2014  . Codeine  10/14/2014  . Hydrocodone-acetaminophen  10/14/2014  . Nitrofurantoin monohyd macro  10/14/2014  . Penicillins  10/14/2014  . Sulfa antibiotics  10/14/2014    Family History  Problem Relation Age of Onset  . Dementia Mother   . Alcohol abuse Father   . Cancer Sister     lung  . Lymphoma Sister   . Lymphoma Sister     Social History   Social History  . Marital Status: Widowed    Spouse Name: N/A  . Number of Children: N/A  . Years of Education: N/A   Occupational History  . Not on file.   Social History Main Topics  . Smoking status: Former Smoker -- 0.50 packs/day for 6 years    Types: Cigarettes    Quit date: 05/08/1987  . Smokeless tobacco: Never Used  . Alcohol Use: No  . Drug Use: No  . Sexual Activity: Not on file   Other Topics Concern  . Not on file   Social History Narrative    Review of Systems: See HPI, otherwise negative ROS  Physical  Exam: BP 134/77 mmHg  Pulse 76  Temp(Src) 97.3 F (36.3 C) (Temporal)  Resp 17  Ht 5' 1"  (1.549 m)  Wt 169 lb (76.658 kg)  BMI 31.95 kg/m2  SpO2 95% General:   Alert,  pleasant and cooperative in NAD Head:  Normocephalic and atraumatic. Neck:  Supple; no masses or thyromegaly. Lungs:  Clear throughout to auscultation.    Heart:  Regular rate and rhythm. Abdomen:  Soft, nontender and nondistended. Normal bowel sounds, without guarding, and without rebound.   Neurologic:  Alert and  oriented x4;  grossly normal neurologically.  Impression/Plan: Jennifer [name] is now here to undergo a screening colonoscopy.  Risks, benefits, and alternatives regarding colonoscopy have been reviewed  with the patient.  Questions have been answered.  All parties agreeable.

## 2015-02-01 NOTE — Anesthesia Preprocedure Evaluation (Signed)
Anesthesia Evaluation  Patient identified by MRN, date of birth, ID band  Airway Mallampati: II  TM Distance: >3 FB Neck ROM: Full    Dental   Pulmonary asthma , former smoker,           Cardiovascular hypertension,      Neuro/Psych    GI/Hepatic   Endo/Other  diabetes, Well Controlled, Type 2  Renal/GU      Musculoskeletal   Abdominal   Peds  Hematology   Anesthesia Other Findings   Reproductive/Obstetrics                             Anesthesia Physical Anesthesia Plan  ASA: III  Anesthesia Plan: MAC   Post-op Pain Management:    Induction: Intravenous  Airway Management Planned:   Additional Equipment:   Intra-op Plan:   Post-operative Plan:   Informed Consent: I have reviewed the patients History and Physical, chart, labs and discussed the procedure including the risks, benefits and alternatives for the proposed anesthesia with the patient or authorized representative who has indicated his/her understanding and acceptance.     Plan Discussed with: CRNA  Anesthesia Plan Comments:         Anesthesia Quick Evaluation

## 2015-02-01 NOTE — Transfer of Care (Signed)
Immediate Anesthesia Transfer of Care Note  Patient: Jennifer [name]  Procedure(s) Performed: Procedure(s) with comments: COLONOSCOPY WITH PROPOFOL (N/A) - Diabetic - oral meds POLYPECTOMY  Patient Location: PACU  Anesthesia Type: MAC  Level of Consciousness: awake, alert  and patient cooperative  Airway and Oxygen Therapy: Patient Spontanous Breathing and Patient connected to supplemental oxygen  Post-op Assessment: Post-op Vital signs reviewed, Patient's Cardiovascular Status Stable, Respiratory Function Stable, Patent Airway and No signs of Nausea or vomiting  Post-op Vital Signs: Reviewed and stable  Complications: No apparent anesthesia complications

## 2015-02-11 ENCOUNTER — Telehealth: Payer: Self-pay | Admitting: Gastroenterology

## 2015-02-11 DIAGNOSIS — G8929 Other chronic pain: Secondary | ICD-10-CM | POA: Diagnosis not present

## 2015-02-11 DIAGNOSIS — M25562 Pain in left knee: Secondary | ICD-10-CM | POA: Diagnosis not present

## 2015-02-11 DIAGNOSIS — M150 Primary generalized (osteo)arthritis: Secondary | ICD-10-CM | POA: Diagnosis not present

## 2015-02-11 NOTE — Telephone Encounter (Signed)
Please review pathology in Media.

## 2015-02-11 NOTE — Telephone Encounter (Signed)
Patient calling for results from her colonoscopy, Did not see path in chart

## 2015-02-15 NOTE — Telephone Encounter (Signed)
The patients path is not in the chart. Only the pathology order is in the chart.

## 2015-02-15 NOTE — Telephone Encounter (Signed)
Available now.

## 2015-02-16 NOTE — Telephone Encounter (Signed)
Let the patient know her pathology showed adenomas. These are precancerous but have been removed. A repeat colonoscopy is not recommended after the age of 38. If she has any problems or is health and doing well in 3 years then she can consider a repeat colonoscopy.

## 2015-02-16 NOTE — Telephone Encounter (Signed)
Pt notified of results. Added to recall list.

## 2015-02-25 ENCOUNTER — Ambulatory Visit (INDEPENDENT_AMBULATORY_CARE_PROVIDER_SITE_OTHER): Payer: Commercial Managed Care - HMO | Admitting: Family Medicine

## 2015-02-25 ENCOUNTER — Encounter: Payer: Self-pay | Admitting: Family Medicine

## 2015-02-25 VITALS — BP 122/66 | HR 80 | Temp 99.5°F | Resp 16 | Wt 181.0 lb

## 2015-02-25 DIAGNOSIS — J4532 Mild persistent asthma with status asthmaticus: Secondary | ICD-10-CM

## 2015-02-25 DIAGNOSIS — R05 Cough: Secondary | ICD-10-CM

## 2015-02-25 DIAGNOSIS — J069 Acute upper respiratory infection, unspecified: Secondary | ICD-10-CM

## 2015-02-25 DIAGNOSIS — J209 Acute bronchitis, unspecified: Secondary | ICD-10-CM | POA: Diagnosis not present

## 2015-02-25 DIAGNOSIS — R059 Cough, unspecified: Secondary | ICD-10-CM

## 2015-02-25 MED ORDER — HYDROCOD POLST-CPM POLST ER 10-8 MG/5ML PO SUER
5.0000 mL | Freq: Two times a day (BID) | ORAL | Status: DC | PRN
Start: 2015-02-25 — End: 2015-05-12

## 2015-02-25 MED ORDER — PREDNISONE 10 MG PO TABS: ORAL_TABLET | ORAL | Status: DC

## 2015-02-25 MED ORDER — AZITHROMYCIN 250 MG PO TABS
ORAL_TABLET | ORAL | Status: DC
Start: 2015-02-25 — End: 2015-05-12

## 2015-02-25 NOTE — Progress Notes (Signed)
Patient ID: Jennifer [name], female   DOB: 12/22/1940, 74 y.o.   MRN: [id number]         Patient: Jennifer [name] Female    DOB: 12/14/1940   74 y.o.   MRN: [id number] Visit Date: 02/25/2015  Today's Provider: Margarita Rana, MD   Chief Complaint  Patient presents with  . Cough  . URI   Subjective:    Cough This is a new problem. The current episode started in the past 7 days. The problem has been gradually worsening. The cough is non-productive. Associated symptoms include chills (did not check tremperature. ), headaches, postnasal drip, shortness of breath and wheezing. Pertinent negatives include no ear pain, eye redness, fever, rash, rhinorrhea or sore throat. She has tried nothing for the symptoms.  URI  This is a new problem. The current episode started in the past 7 days. The problem has been gradually worsening. Associated symptoms include coughing, headaches, nausea and wheezing. Pertinent negatives include no abdominal pain, congestion, diarrhea, ear pain, neck pain, plugged ear sensation, rash, rhinorrhea, sinus pain, sneezing, sore throat, swollen glands or vomiting. She has tried nothing for the symptoms.       Allergies  Allergen Reactions  . Celecoxib Other (See Comments)    Hearing loss while taking  . Codeine     Pt denies  . Hydrocodone-Acetaminophen     Pt denies  . Nitrofurantoin Monohyd Macro     "Sugar got high"  . Penicillins   . Sulfa Antibiotics    Previous Medications   ALCOHOL SWABS (B-D SINGLE USE SWABS REGULAR) PADS       BLOOD GLUCOSE CALIBRATION (ACCU-CHEK SMARTVIEW CONTROL) LIQD       BLOOD GLUCOSE MONITORING SUPPL (ACCU-CHEK NANO SMARTVIEW) W/DEVICE KIT    1 strip by Does not apply route daily.   GLUCOSE BLOOD (ACCU-CHEK SMARTVIEW) TEST STRIP    To check blood sugar twice a day.   LANCETS MISC. (ACCU-CHEK FASTCLIX LANCET) KIT       METFORMIN (GLUCOPHAGE) 500 MG TABLET    Take 500 mg by mouth 2 (two) times daily with a meal.    MILK THISTLE PO     Take by mouth.   OMEPRAZOLE (PRILOSEC) 20 MG CAPSULE    Take 1 capsule (20 mg total) by mouth 2 (two) times daily.   QUINAPRIL (ACCUPRIL) 10 MG TABLET    Take 10 mg by mouth daily.    TRAMADOL (ULTRAM) 50 MG TABLET    Take 1 tablet (50 mg total) by mouth every 6 (six) hours as needed.   TURMERIC CURCUMIN PO    Take by mouth.   VITAMIN D, ERGOCALCIFEROL, (DRISDOL) 50000 UNITS CAPS CAPSULE    Take 1 capsule (50,000 Units total) by mouth once a week.    Review of Systems  Constitutional: Positive for chills (did not check tremperature. ), diaphoresis and fatigue. Negative for fever, activity change, appetite change and unexpected weight change.  HENT: Positive for postnasal drip. Negative for congestion, dental problem, drooling, ear discharge, ear pain, facial swelling, hearing loss, mouth sores, nosebleeds, rhinorrhea, sinus pressure, sneezing, sore throat, tinnitus and trouble swallowing.   Eyes: Positive for discharge. Negative for photophobia, pain, redness, itching and visual disturbance.  Respiratory: Positive for cough, chest tightness, shortness of breath and wheezing. Negative for apnea, choking and stridor.   Cardiovascular: Negative.   Gastrointestinal: Positive for nausea. Negative for vomiting, abdominal pain, diarrhea, constipation, blood in stool, abdominal distention, anal bleeding and rectal pain.  Musculoskeletal: Negative for neck pain.  Skin: Negative for rash.  Neurological: Positive for headaches. Negative for dizziness and light-headedness.    Social History  Substance Use Topics  . Smoking status: Former Smoker -- 0.50 packs/day for 6 years    Types: Cigarettes    Quit date: 05/08/1987  . Smokeless tobacco: Never Used  . Alcohol Use: No   Objective:   BP 122/66 mmHg  Pulse 80  Temp(Src) 99.5 F (37.5 C) (Oral)  Resp 16  Wt 181 lb (82.101 kg)  SpO2 93%  Physical Exam  Constitutional: She is oriented to person, place, and time. She appears well-developed  and well-nourished.  HENT:  Head: Normocephalic and atraumatic.  Right Ear: External ear normal.  Left Ear: External ear normal.  Mouth/Throat: Oropharynx is clear and moist.  Eyes: Conjunctivae and EOM are normal. Pupils are equal, round, and reactive to light.  Neck: Normal range of motion. Neck supple.  Cardiovascular: Normal rate and regular rhythm.   Pulmonary/Chest: Effort normal. Wheezes: Really more coarse breath sounds.   Neurological: She is alert and oriented to person, place, and time.  Psychiatric: She has a normal mood and affect. Her behavior is normal. Judgment and thought content normal.      Assessment & Plan:     1. Cough Will treat with cough syrup per patient request. Does not want the cheaper, shorter acting version.   - chlorpheniramine-HYDROcodone (TUSSIONEX PENNKINETIC ER) 10-8 MG/5ML SUER; Take 5 mLs by mouth every 12 (twelve) hours as needed for cough.  Dispense: 115 mL; Refill: 0  2. Upper respiratory infection Will treat for bronchitis.  - azithromycin (ZITHROMAX) 250 MG tablet; 2 today and then one a day for 4 more days.  Dispense: 6 tablet; Refill: 0  3. Airway hyperreactivity, mild persistent, with status asthmaticus Will treat with prednisone. Call if worsens or does not improved.  - predniSONE (DELTASONE) 10 MG tablet; 6 po for 2 days and then 5 po for 2 days and then 4 po for 2 days and 3 po for 2 days and then 2 po for 2 days and then 1 po for 2 days.  Dispense: 42 tablet; Refill: 0  4. Acute bronchitis, unspecified organism Treated as above.  Call if worsens, or does not improve, especially fever.      Margarita Rana, MD  [facility]

## 2015-03-02 ENCOUNTER — Encounter: Payer: Self-pay | Admitting: Gastroenterology

## 2015-03-29 DIAGNOSIS — E113293 Type 2 diabetes mellitus with mild nonproliferative diabetic retinopathy without macular edema, bilateral: Secondary | ICD-10-CM | POA: Diagnosis not present

## 2015-03-29 LAB — HM DIABETES EYE EXAM

## 2015-04-08 ENCOUNTER — Other Ambulatory Visit: Payer: Self-pay | Admitting: Family Medicine

## 2015-04-08 DIAGNOSIS — E559 Vitamin D deficiency, unspecified: Secondary | ICD-10-CM

## 2015-04-08 NOTE — Telephone Encounter (Signed)
Pt contacted office for refill request on the following medications:  Vitamin D, Ergocalciferol, (DRISDOL) 50000 UNITS CAPS capsule.  [place]  CB#[phone]/MW

## 2015-04-09 MED ORDER — VITAMIN D (ERGOCALCIFEROL) 1.25 MG (50000 UNIT) PO CAPS: 50000.0000 [IU] | ORAL_CAPSULE | ORAL | Status: DC

## 2015-05-12 ENCOUNTER — Encounter: Payer: Self-pay | Admitting: Family Medicine

## 2015-05-12 ENCOUNTER — Ambulatory Visit (INDEPENDENT_AMBULATORY_CARE_PROVIDER_SITE_OTHER): Payer: Commercial Managed Care - HMO | Admitting: Family Medicine

## 2015-05-12 VITALS — BP 122/62 | HR 80 | Temp 98.4°F | Resp 16 | Wt 185.0 lb

## 2015-05-12 DIAGNOSIS — J209 Acute bronchitis, unspecified: Secondary | ICD-10-CM | POA: Diagnosis not present

## 2015-05-12 DIAGNOSIS — E559 Vitamin D deficiency, unspecified: Secondary | ICD-10-CM

## 2015-05-12 MED ORDER — PREDNISONE 10 MG PO TABS: ORAL_TABLET | ORAL | Status: DC

## 2015-05-12 MED ORDER — HYDROCOD POLST-CPM POLST ER 10-8 MG/5ML PO SUER: 5.0000 mL | Freq: Two times a day (BID) | ORAL | Status: DC | PRN

## 2015-05-12 MED ORDER — AZITHROMYCIN 250 MG PO TABS: ORAL_TABLET | ORAL | Status: DC

## 2015-05-12 MED ORDER — VITAMIN D (ERGOCALCIFEROL) 1.25 MG (50000 UNIT) PO CAPS: 50000.0000 [IU] | ORAL_CAPSULE | ORAL | Status: DC

## 2015-05-12 NOTE — Progress Notes (Signed)
Subjective:    Patient ID: Jennifer [name], female    DOB: 1941/05/04, 75 y.o.   MRN: [id number]  URI  This is a new problem. The current episode started in the past 7 days (x 5 days). The problem has been gradually worsening. Maximum temperature: pt has felt feverish, but has no temperature recorded. Associated symptoms include chest pain, congestion, coughing (dry), diarrhea, rhinorrhea, a sore throat and wheezing. Pertinent negatives include no abdominal pain, dysuria, ear pain, headaches, joint pain, joint swelling, nausea, plugged ear sensation, sinus pain, sneezing, swollen glands or vomiting. She has tried nothing for the symptoms.      Review of Systems  HENT: Positive for congestion, rhinorrhea and sore throat. Negative for ear pain and sneezing.   Respiratory: Positive for cough (dry) and wheezing.   Cardiovascular: Positive for chest pain.  Gastrointestinal: Positive for diarrhea. Negative for nausea, vomiting and abdominal pain.  Genitourinary: Negative for dysuria.  Musculoskeletal: Negative for joint pain.  Neurological: Negative for headaches.   BP 122/62 mmHg  Pulse 80  Temp(Src) 98.4 F (36.9 C) (Oral)  Resp 16  Wt 185 lb (83.915 kg)  SpO2 95%   Patient Active Problem List   Diagnosis Date Noted  . Bronchitis, acute 02/25/2015  . Special screening for malignant neoplasms, colon   . Benign neoplasm of ascending colon   . Benign neoplasm of transverse colon   . Benign neoplasm of sigmoid colon   . Acute cystitis 12/31/2014  . Arthritis 10/21/2014  . Allergic rhinitis 10/14/2014  . Airway hyperreactivity 10/14/2014  . Back ache 10/14/2014  . Bell palsy 10/14/2014  . Controlled diabetes mellitus type II without complication ([place]) 81/27/5170  . Elevation of level of transaminase or lactic acid dehydrogenase (LDH) 10/14/2014  . Calculus of gallbladder 10/14/2014  . HLD (hyperlipidemia) 10/14/2014  . BP (high blood pressure) 10/14/2014  . Gonalgia 10/14/2014    . Benign neoplasm of soft tissues 10/14/2014  . NASH (nonalcoholic steatohepatitis) 10/14/2014  . Adiposity 10/14/2014  . Esophagitis, reflux 10/14/2014  . Enlargement of spleen 10/14/2014  . Change in blood platelet count 10/14/2014  . Avitaminosis D 10/14/2014   Past Medical History  Diagnosis Date  . Allergy   . Asthma   . Diabetes mellitus without complication ([place])   . Hypertension   . Hyperlipidemia   . GERD (gastroesophageal reflux disease)   . Fatty liver   . Arthritis   . Motion sickness     boats  . Mild nonproliferative diabetic retinopathy of both eyes [facility])    Current Outpatient Prescriptions on File Prior to Visit  Medication Sig  . Alcohol Swabs (B-D SINGLE USE SWABS REGULAR) PADS   . Blood Glucose Calibration (ACCU-CHEK SMARTVIEW CONTROL) LIQD   . Blood Glucose Monitoring Suppl (ACCU-CHEK NANO SMARTVIEW) W/DEVICE KIT 1 strip by Does not apply route daily.  Marland Kitchen glucose blood (ACCU-CHEK SMARTVIEW) test strip To check blood sugar twice a day.  . Lancets Misc. (ACCU-CHEK FASTCLIX LANCET) KIT   . metFORMIN (GLUCOPHAGE) 500 MG tablet Take 500 mg by mouth 2 (two) times daily with a meal.   . MILK THISTLE PO Take by mouth.  Marland Kitchen omeprazole (PRILOSEC) 20 MG capsule Take 1 capsule (20 mg total) by mouth 2 (two) times daily.  . quinapril (ACCUPRIL) 10 MG tablet Take 10 mg by mouth daily.   . TURMERIC CURCUMIN PO Take by mouth.  . Vitamin D, Ergocalciferol, (DRISDOL) 50000 UNITS CAPS capsule Take 1 capsule (50,000 Units total) by mouth  once a week. (Patient not taking: Reported on 05/12/2015)   No current facility-administered medications on file prior to visit.   Allergies  Allergen Reactions  . Celecoxib Other (See Comments)    Hearing loss while taking  . Codeine     Pt denies  . Hydrocodone-Acetaminophen     Pt denies  . Nitrofurantoin Monohyd Macro     "Sugar got high"  . Penicillins   . Sulfa Antibiotics    Past Surgical History  Procedure Laterality Date  .  Tonsillectomy and adenoidectomy    . Abdominal hysterectomy  1978  . Colonoscopy with propofol N/A 02/01/2015    Procedure: COLONOSCOPY WITH PROPOFOL;  Surgeon: Lucilla Lame, MD;  Location: [facility];  Service: Endoscopy;  Laterality: N/A;  Diabetic - oral meds  . Polypectomy  02/01/2015    Procedure: POLYPECTOMY;  Surgeon: Lucilla Lame, MD;  Location: [place];  Service: Endoscopy;;   Social History   Social History  . Marital Status: Widowed    Spouse Name: N/A  . Number of Children: N/A  . Years of Education: N/A   Occupational History  . Not on file.   Social History Main Topics  . Smoking status: Former Smoker -- 0.50 packs/day for 6 years    Types: Cigarettes    Quit date: 05/08/1987  . Smokeless tobacco: Never Used  . Alcohol Use: No  . Drug Use: No  . Sexual Activity: Not on file   Other Topics Concern  . Not on file   Social History Narrative   Family History  Problem Relation Age of Onset  . Dementia Mother   . Alcohol abuse Father   . Cancer Sister     lung  . Lymphoma Sister   . Lymphoma Sister      .result     Objective:   Physical Exam  Constitutional: She appears well-developed and well-nourished.  HENT:  Mouth/Throat: No oropharyngeal exudate.  Eyes: Conjunctivae are normal.  Neck: Neck supple.  Cardiovascular: Normal rate and regular rhythm.   Pulmonary/Chest:  Course rhonchi   Lymphadenopathy:    She has no cervical adenopathy.  Psychiatric: She has a normal mood and affect. Her behavior is normal.   BP 122/62 mmHg  Pulse 80  Temp(Src) 98.4 F (36.9 C) (Oral)  Resp 16  Wt 185 lb (83.915 kg)  SpO2 95%     Assessment & Plan:  1. Acute bronchitis, unspecified organism New problem. Will start medication below. Patient instructed to call back if condition worsens or does not improve.    - azithromycin (ZITHROMAX) 250 MG tablet; 2 today and then one a day for 4 more days.  Dispense: 6 tablet; Refill: 0 - predniSONE  (DELTASONE) 10 MG tablet; 6 po for 1 day and then 5 po for 1 day and then 4 po for 1 day and 3 po for 1 day and then 2 po for 1 day and then 1 po for 1 day.  Dispense: 21 tablet; Refill: 0 - chlorpheniramine-HYDROcodone (TUSSIONEX PENNKINETIC ER) 10-8 MG/5ML SUER; Take 5 mLs by mouth every 12 (twelve) hours as needed for cough.  Dispense: 140 mL; Refill: 0  2. Avitaminosis D Will send new rx. Still low on 50, 000 weekly, so will continue.  - Vitamin D, Ergocalciferol, (DRISDOL) 50000 units CAPS capsule; Take 1 capsule (50,000 Units total) by mouth once a week.  Dispense: 12 capsule; Refill: 3   Patient was seen and examined by Jerrell Belfast, MD, and  note scribed by Renaldo Fiddler, CMA. I have reviewed the document for accuracy and completeness and I agree with above. Jerrell Belfast, MD  Margarita Rana, MD

## 2015-05-14 ENCOUNTER — Telehealth: Payer: Self-pay | Admitting: Family Medicine

## 2015-05-14 MED ORDER — CIPROFLOXACIN HCL 250 MG PO TABS: 250.0000 mg | ORAL_TABLET | Freq: Two times a day (BID) | ORAL | Status: DC

## 2015-05-14 NOTE — Telephone Encounter (Signed)
Sent in rx.  Please notify patient.  Thanks.

## 2015-05-14 NOTE — Telephone Encounter (Signed)
Patient advised as below. sd

## 2015-05-14 NOTE — Telephone Encounter (Signed)
Pt stated that when she was here for an OV on 05/12/15 she discussed that she thought she might have a UTI. Pt stated that now she is having burning while voiding and the burning is so painful that she can barely urinate due to the pain. Pt stated that she has no way to get to the office today and would like something sent in to [facility]. Pt stated that the last time she had a UTI the medication seemed to work well but couldn't remember the name. It looks like she was her 12/31/14 and was started on ciprofloxacin (CIPRO) 500 MG tablet. Please advise. Thanks TNP

## 2015-06-07 ENCOUNTER — Other Ambulatory Visit: Payer: Self-pay

## 2015-06-07 DIAGNOSIS — E119 Type 2 diabetes mellitus without complications: Secondary | ICD-10-CM

## 2015-06-07 DIAGNOSIS — I10 Essential (primary) hypertension: Secondary | ICD-10-CM

## 2015-06-07 DIAGNOSIS — K2100 Gastro-esophageal reflux disease with esophagitis, without bleeding: Secondary | ICD-10-CM

## 2015-06-07 DIAGNOSIS — E559 Vitamin D deficiency, unspecified: Secondary | ICD-10-CM

## 2015-06-07 MED ORDER — OMEPRAZOLE 20 MG PO CPDR: 20.0000 mg | DELAYED_RELEASE_CAPSULE | Freq: Two times a day (BID) | ORAL | Status: DC

## 2015-06-07 MED ORDER — METFORMIN HCL 500 MG PO TABS: 500.0000 mg | ORAL_TABLET | Freq: Two times a day (BID) | ORAL | Status: DC

## 2015-06-07 MED ORDER — QUINAPRIL HCL 10 MG PO TABS: 10.0000 mg | ORAL_TABLET | Freq: Every day | ORAL | Status: DC

## 2015-06-07 MED ORDER — CIPROFLOXACIN HCL 250 MG PO TABS: 250.0000 mg | ORAL_TABLET | Freq: Two times a day (BID) | ORAL | Status: DC

## 2015-06-07 MED ORDER — VITAMIN D (ERGOCALCIFEROL) 1.25 MG (50000 UNIT) PO CAPS: 50000.0000 [IU] | ORAL_CAPSULE | ORAL | Status: DC

## 2015-06-07 MED ORDER — GLUCOSE BLOOD VI STRP: ORAL_STRIP | Status: DC

## 2015-06-07 NOTE — Telephone Encounter (Signed)
Patient is requesting medication Refill on her Metformin 500 mg, Quinapril 10 mg, Omeprazole 20 mg, vitamin D per patient she usually gets a three month supply. Accu-Check. Patient asked if this can be send through [facility].Patient said if not sure to call her back. Number [phone]  Patient also is requesting another round of Cipro. To be called into [facility].  Thanks,  -Joseline

## 2015-06-08 ENCOUNTER — Other Ambulatory Visit: Payer: Self-pay | Admitting: Family Medicine

## 2015-06-08 MED ORDER — CIPROFLOXACIN HCL 250 MG PO TABS: 250.0000 mg | ORAL_TABLET | Freq: Two times a day (BID) | ORAL | Status: DC

## 2015-06-08 NOTE — Telephone Encounter (Signed)
Sent medication into local pharmacy. Informed pt. Renaldo Fiddler, CMA

## 2015-06-08 NOTE — Telephone Encounter (Signed)
Pt's Rx for ciprofloxacin (CIPRO) 250 MG tablet was sent to the mail order pharmacy and pt would like it sent to [facility]. Thanks TNP

## 2015-06-09 ENCOUNTER — Other Ambulatory Visit: Payer: Self-pay | Admitting: Family Medicine

## 2015-06-09 DIAGNOSIS — K2100 Gastro-esophageal reflux disease with esophagitis, without bleeding: Secondary | ICD-10-CM

## 2015-06-14 ENCOUNTER — Telehealth: Payer: Self-pay | Admitting: Family Medicine

## 2015-06-14 MED ORDER — CIPROFLOXACIN HCL 250 MG PO TABS: 250.0000 mg | ORAL_TABLET | Freq: Two times a day (BID) | ORAL | Status: DC

## 2015-06-14 NOTE — Telephone Encounter (Signed)
Pt contacted office for refill request on the following medications:  ciprofloxacin (CIPRO) 250 MG tablet.  [place].  CB#[phone]/MW

## 2015-07-28 ENCOUNTER — Ambulatory Visit: Payer: Commercial Managed Care - HMO | Admitting: Family Medicine

## 2015-08-17 DIAGNOSIS — M150 Primary generalized (osteo)arthritis: Secondary | ICD-10-CM | POA: Diagnosis not present

## 2015-08-17 DIAGNOSIS — M25562 Pain in left knee: Secondary | ICD-10-CM | POA: Diagnosis not present

## 2015-08-18 ENCOUNTER — Other Ambulatory Visit: Payer: Self-pay | Admitting: Rheumatology

## 2015-08-18 DIAGNOSIS — M25562 Pain in left knee: Secondary | ICD-10-CM

## 2015-08-24 ENCOUNTER — Ambulatory Visit: Payer: Commercial Managed Care - HMO | Admitting: Family Medicine

## 2015-08-25 ENCOUNTER — Ambulatory Visit (INDEPENDENT_AMBULATORY_CARE_PROVIDER_SITE_OTHER): Payer: Commercial Managed Care - HMO | Admitting: Family Medicine

## 2015-08-25 ENCOUNTER — Encounter: Payer: Self-pay | Admitting: Family Medicine

## 2015-08-25 VITALS — BP 124/68 | HR 76 | Temp 98.4°F | Resp 16 | Wt 182.0 lb

## 2015-08-25 DIAGNOSIS — E119 Type 2 diabetes mellitus without complications: Secondary | ICD-10-CM | POA: Diagnosis not present

## 2015-08-25 DIAGNOSIS — E785 Hyperlipidemia, unspecified: Secondary | ICD-10-CM | POA: Diagnosis not present

## 2015-08-25 DIAGNOSIS — K7581 Nonalcoholic steatohepatitis (NASH): Secondary | ICD-10-CM

## 2015-08-25 DIAGNOSIS — I10 Essential (primary) hypertension: Secondary | ICD-10-CM

## 2015-08-25 LAB — POCT GLYCOSYLATED HEMOGLOBIN (HGB A1C)
Est. average glucose Bld gHb Est-mCnc: 151
HEMOGLOBIN A1C: 6.9
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
## 2015-08-25 NOTE — Addendum Note (Signed)
Addended by: Jerrell Belfast on: 08/25/2015 10:42 AM   Modules accepted: Miquel Dunn

## 2015-08-26 LAB — COMPREHENSIVE METABOLIC PANEL
A/G RATIO: 1.6 (ref 1.2–2.2)
ALK PHOS: 74 IU/L (ref 39–117)
ALT: 31 IU/L (ref 0–32)
AST: 41 IU/L — AB (ref 0–40)
Albumin: 4.2 g/dL (ref 3.5–4.8)
BILIRUBIN TOTAL: 0.7 mg/dL (ref 0.0–1.2)
BUN/Creatinine Ratio: 16 (ref 12–28)
BUN: 10 mg/dL (ref 8–27)
CHLORIDE: 98 mmol/L (ref 96–106)
CO2: 28 mmol/L (ref 18–29)
Calcium: 9.7 mg/dL (ref 8.7–10.3)
Creatinine, Ser: 0.61 mg/dL (ref 0.57–1.00)
GFR calc non Af Amer: 89 mL/min/{1.73_m2} (ref >59)
GFR, EST AFRICAN AMERICAN: 103 mL/min/{1.73_m2} (ref >59)
Globulin, Total: 2.6 g/dL (ref 1.5–4.5)
Glucose: 133 mg/dL — ABNORMAL HIGH (ref 65–99)
POTASSIUM: 5.2 mmol/L (ref 3.5–5.2)
Sodium: 142 mmol/L (ref 134–144)
TOTAL PROTEIN: 6.8 g/dL (ref 6.0–8.5)

## 2015-08-26 LAB — CBC WITH DIFFERENTIAL/PLATELET
BASOS ABS: 0 10*3/uL (ref 0.0–0.2)
Basos: 0 %
EOS (ABSOLUTE): 0 10*3/uL (ref 0.0–0.4)
Eos: 1 %
Hematocrit: 37.3 % (ref 34.0–46.6)
Hemoglobin: 12.3 g/dL (ref 11.1–15.9)
IMMATURE GRANS (ABS): 0 10*3/uL (ref 0.0–0.1)
Immature Granulocytes: 0 %
LYMPHS: 29 %
Lymphocytes Absolute: 1 10*3/uL (ref 0.7–3.1)
MCH: 28.1 pg (ref 26.6–33.0)
MCHC: 33 g/dL (ref 31.5–35.7)
MCV: 85 fL (ref 79–97)
Monocytes Absolute: 0.4 10*3/uL (ref 0.1–0.9)
Monocytes: 10 %
NEUTROS ABS: 2.1 10*3/uL (ref 1.4–7.0)
Neutrophils: 60 %
PLATELETS: 85 10*3/uL — AB (ref 150–379)
RBC: 4.38 x10E6/uL (ref 3.77–5.28)
RDW: 14.5 % (ref 12.3–15.4)
WBC: 3.5 10*3/uL (ref 3.4–10.8)

## 2015-08-26 LAB — LIPID PANEL
Chol/HDL Ratio: 3.7 ratio units (ref 0.0–4.4)
Cholesterol, Total: 217 mg/dL — ABNORMAL HIGH (ref 100–199)
HDL: 59 mg/dL (ref >39)
LDL Calculated: 139 mg/dL — ABNORMAL HIGH (ref 0–99)
Triglycerides: 96 mg/dL (ref 0–149)
VLDL Cholesterol Cal: 19 mg/dL (ref 5–40)

## 2015-08-26 LAB — TSH: TSH: 1.11 u[IU]/mL (ref 0.450–4.500)

## 2015-08-27 ENCOUNTER — Telehealth: Payer: Self-pay

## 2015-08-27 DIAGNOSIS — D696 Thrombocytopenia, unspecified: Secondary | ICD-10-CM

## 2015-08-27 NOTE — Telephone Encounter (Signed)
LMTCB 08/27/2015  Thanks,   -Mickel Baas

## 2015-08-27 NOTE — Telephone Encounter (Signed)
-----   Message from Margarita Rana, MD sent at 08/26/2015  4:58 PM EDT ----- Platelets lower than previous. Recheck in one week for stability. Very important to recheck. Also cholesterol slightly higher than previous. Please see if patient wants to consider a low dose statin drug. Thanks.

## 2015-08-27 NOTE — Telephone Encounter (Signed)
Pt informed. Lab slip printed. She does not want to start a statin at this time, she knows that her diet is causing it to be elevated.

## 2015-09-03 DIAGNOSIS — D696 Thrombocytopenia, unspecified: Secondary | ICD-10-CM | POA: Diagnosis not present

## 2015-09-04 LAB — CBC WITH DIFFERENTIAL/PLATELET
BASOS ABS: 0 10*3/uL (ref 0.0–0.2)
BASOS: 0 %
EOS (ABSOLUTE): 0 10*3/uL (ref 0.0–0.4)
Eos: 1 %
Hematocrit: 37.9 % (ref 34.0–46.6)
Hemoglobin: 12.3 g/dL (ref 11.1–15.9)
IMMATURE GRANS (ABS): 0 10*3/uL (ref 0.0–0.1)
IMMATURE GRANULOCYTES: 0 %
Lymphocytes Absolute: 1.2 10*3/uL (ref 0.7–3.1)
Lymphs: 32 %
MCH: 26.8 pg (ref 26.6–33.0)
MCHC: 32.5 g/dL (ref 31.5–35.7)
MCV: 83 fL (ref 79–97)
MONOS ABS: 0.2 10*3/uL (ref 0.1–0.9)
Monocytes: 6 %
NEUTROS ABS: 2.2 10*3/uL (ref 1.4–7.0)
Neutrophils: 61 %
PLATELETS: 82 10*3/uL — AB (ref 150–379)
RBC: 4.59 x10E6/uL (ref 3.77–5.28)
RDW: 14.9 % (ref 12.3–15.4)
WBC: 3.6 10*3/uL (ref 3.4–10.8)

## 2015-09-06 ENCOUNTER — Telehealth: Payer: Self-pay

## 2015-09-06 DIAGNOSIS — D696 Thrombocytopenia, unspecified: Secondary | ICD-10-CM

## 2015-09-06 NOTE — Telephone Encounter (Signed)
Left message to call back  

## 2015-09-06 NOTE — Telephone Encounter (Signed)
Please refer to hematology for thrombocytopenia. Can be an autoimmune process or related to the liver. Hard to say. Not low enough to cause bleeding, but enough it should be addressed. Thanks.

## 2015-09-06 NOTE — Telephone Encounter (Signed)
Advised patient as below. Patient reports that she has never seen a hematologist before. She is interested in seeing one. Patient wants to know what causes platelets to be low? She reports that she has changed her diet (eating more fiber and beans) because she read that this would help.

## 2015-09-06 NOTE — Telephone Encounter (Signed)
Wrong box-aa

## 2015-09-06 NOTE — Telephone Encounter (Signed)
-----   Message from Margarita Rana, MD sent at 09/04/2015  1:40 PM EDT ----- Platelets low, lower than previous, please see if she has see hematology for this in the past. Not a new problem. Thanks.

## 2015-09-06 NOTE — Telephone Encounter (Signed)
Referral entered.  Pt advised as directed below.   Thanks,   -Mickel Baas

## 2015-09-14 ENCOUNTER — Ambulatory Visit
Admission: RE | Admit: 2015-09-14 | Discharge: 2015-09-14 | Disposition: A | Discharge status: Home / Self Care | Payer: Commercial Managed Care - HMO | Source: Ambulatory Visit | Attending: Rheumatology | Admitting: Rheumatology

## 2015-09-14 DIAGNOSIS — S83272A Complex tear of lateral meniscus, current injury, left knee, initial encounter: Secondary | ICD-10-CM | POA: Insufficient documentation

## 2015-09-14 DIAGNOSIS — M25562 Pain in left knee: Secondary | ICD-10-CM | POA: Diagnosis not present

## 2015-09-14 DIAGNOSIS — X58XXXA Exposure to other specified factors, initial encounter: Secondary | ICD-10-CM | POA: Diagnosis not present

## 2015-09-14 DIAGNOSIS — S83242A Other tear of medial meniscus, current injury, left knee, initial encounter: Secondary | ICD-10-CM | POA: Diagnosis not present

## 2015-09-17 ENCOUNTER — Inpatient Hospital Stay: Payer: Commercial Managed Care - HMO | Admitting: Internal Medicine

## 2015-09-21 ENCOUNTER — Inpatient Hospital Stay: Payer: Commercial Managed Care - HMO

## 2015-09-21 ENCOUNTER — Inpatient Hospital Stay: Payer: Commercial Managed Care - HMO | Attending: Internal Medicine | Admitting: Internal Medicine

## 2015-09-21 VITALS — BP 155/81 | HR 76 | Temp 98.7°F | Resp 18 | Wt 181.4 lb

## 2015-09-21 DIAGNOSIS — D696 Thrombocytopenia, unspecified: Secondary | ICD-10-CM | POA: Insufficient documentation

## 2015-09-21 DIAGNOSIS — J45909 Unspecified asthma, uncomplicated: Secondary | ICD-10-CM | POA: Insufficient documentation

## 2015-09-21 DIAGNOSIS — R161 Splenomegaly, not elsewhere classified: Secondary | ICD-10-CM | POA: Insufficient documentation

## 2015-09-21 DIAGNOSIS — K760 Fatty (change of) liver, not elsewhere classified: Secondary | ICD-10-CM | POA: Diagnosis not present

## 2015-09-21 DIAGNOSIS — Z7984 Long term (current) use of oral hypoglycemic drugs: Secondary | ICD-10-CM | POA: Diagnosis not present

## 2015-09-21 DIAGNOSIS — Z87891 Personal history of nicotine dependence: Secondary | ICD-10-CM | POA: Insufficient documentation

## 2015-09-21 DIAGNOSIS — M1990 Unspecified osteoarthritis, unspecified site: Secondary | ICD-10-CM | POA: Insufficient documentation

## 2015-09-21 DIAGNOSIS — Z79899 Other long term (current) drug therapy: Secondary | ICD-10-CM | POA: Diagnosis not present

## 2015-09-21 DIAGNOSIS — K7460 Unspecified cirrhosis of liver: Secondary | ICD-10-CM | POA: Diagnosis not present

## 2015-09-21 DIAGNOSIS — I10 Essential (primary) hypertension: Secondary | ICD-10-CM | POA: Diagnosis not present

## 2015-09-21 DIAGNOSIS — E785 Hyperlipidemia, unspecified: Secondary | ICD-10-CM | POA: Diagnosis not present

## 2015-09-21 DIAGNOSIS — K219 Gastro-esophageal reflux disease without esophagitis: Secondary | ICD-10-CM | POA: Diagnosis not present

## 2015-09-21 DIAGNOSIS — E113293 Type 2 diabetes mellitus with mild nonproliferative diabetic retinopathy without macular edema, bilateral: Secondary | ICD-10-CM | POA: Insufficient documentation

## 2015-09-21 LAB — CBC WITH DIFFERENTIAL/PLATELET
Basophils Absolute: 0 10*3/uL (ref 0–0.1)
EOS ABS: 0 10*3/uL (ref 0–0.7)
HCT: 38.1 % (ref 35.0–47.0)
HEMOGLOBIN: 12.6 g/dL (ref 12.0–16.0)
Lymphocytes Relative: 29 %
Lymphs Abs: 0.9 10*3/uL — ABNORMAL LOW (ref 1.0–3.6)
MCH: 27.7 pg (ref 26.0–34.0)
MCHC: 33.1 g/dL (ref 32.0–36.0)
MCV: 83.6 fL (ref 80.0–100.0)
Monocytes Absolute: 0.2 10*3/uL (ref 0.2–0.9)
Neutro Abs: 1.9 10*3/uL (ref 1.4–6.5)
PLATELETS: 72 10*3/uL — AB (ref 150–440)
RBC: 4.56 MIL/uL (ref 3.80–5.20)
RDW: 14.8 % — AB (ref 11.5–14.5)
WBC: 3.2 10*3/uL — ABNORMAL LOW (ref 3.6–11.0)

## 2015-09-21 LAB — SEDIMENTATION RATE: SED RATE: 11 mm/h (ref 0–30)

## 2015-09-21 NOTE — Progress Notes (Signed)
[facility] CONSULT NOTE  Patient Care Team: Neyah Maloney, MD as PCP - General (Family Medicine)  CHIEF COMPLAINTS/PURPOSE OF CONSULTATION:   # THROMBOCYTOPENIA- [2015-100s-80s; 2017] N-Hb/WBC;  ? Clumping vs Hypersplenism  #  Cirrhosis/ splenomegaly [US- 2016; stable since 2013]  HISTORY OF PRESENTING ILLNESS:  Jennifer [name] 75 y.o.  female Caucasian patient history of compensated cirrhosis/splenomegaly [based on imaging] has been referred to us for further evaluation of thrombocytopenia.  Patient has a known history of thrombocytopenia- however denies any frequent nosebleeds or gum bleeding or blood in stools black red stools. Patient does notice easy bruising. She is not on aspirin.  Patient denies any alcohol abuse. She does admit to taking milk thistle/and some other over-the-counter med; which seemed to have helped her lose weight/help with her liver/ overall well-being.  ROS: Patient complains of being "cold" all the time. She did lose 30-40 pounds intentionally. Denies any unusual swelling in the legs. Denies any skin rash. A complete 10 point review of system is done which is negative except mentioned above in history of present illness  MEDICAL HISTORY:  Past Medical History  Diagnosis Date  . Allergy   . Asthma   . Diabetes mellitus without complication (HCC)   . Hypertension   . Hyperlipidemia   . GERD (gastroesophageal reflux disease)   . Fatty liver   . Arthritis   . Motion sickness     boats  . Mild nonproliferative diabetic retinopathy of both eyes (HCC)     SURGICAL HISTORY: Past Surgical History  Procedure Laterality Date  . Tonsillectomy and adenoidectomy    . Abdominal hysterectomy  1978  . Colonoscopy with propofol N/A 02/01/2015    Procedure: COLONOSCOPY WITH PROPOFOL;  Surgeon: Darren Wohl, MD;  Location: [facility];  Service: Endoscopy;  Laterality: N/A;  Diabetic - oral meds  . Polypectomy  02/01/2015    Procedure:  POLYPECTOMY;  Surgeon: Darren Wohl, MD;  Location: [facility];  Service: Endoscopy;;    SOCIAL HISTORY: Social History   Social History  . Marital Status: Widowed    Spouse Name: N/A  . Number of Children: N/A  . Years of Education: N/A   Occupational History  . Not on file.   Social History Main Topics  . Smoking status: Former Smoker -- 0.50 packs/day for 6 years    Types: Cigarettes    Quit date: 05/08/1987  . Smokeless tobacco: Never Used  . Alcohol Use: No  . Drug Use: No  . Sexual Activity: Not on file   Other Topics Concern  . Not on file   Social History Narrative    FAMILY HISTORY: Family History  Problem Relation Age of Onset  . Dementia Mother   . Alcohol abuse Father   . Cancer Sister     lung  . Lymphoma Sister   . Lymphoma Sister     ALLERGIES:  is allergic to celecoxib; codeine; hydrocodone-acetaminophen; nitrofurantoin monohyd macro; penicillins; and sulfa antibiotics.  MEDICATIONS:  Current Outpatient Prescriptions  Medication Sig Dispense Refill  . Alcohol Swabs (B-D SINGLE USE SWABS REGULAR) PADS     . Blood Glucose Calibration (ACCU-CHEK SMARTVIEW CONTROL) LIQD     . Blood Glucose Monitoring Suppl (ACCU-CHEK NANO SMARTVIEW) W/DEVICE KIT 1 strip by Does not apply route daily. 1 kit 6  . glucose blood (ACCU-CHEK SMARTVIEW) test strip To check blood sugar twice a day.  DX E11.9 200 each 1  . Lancets Misc. (ACCU-CHEK FASTCLIX LANCET) KIT     .   metFORMIN (GLUCOPHAGE) 500 MG tablet Take 1 tablet (500 mg total) by mouth 2 (two) times daily with a meal. 180 tablet 1  . MILK THISTLE PO Take by mouth.    . omeprazole (PRILOSEC) 20 MG capsule TAKE 1 CAPSULE EVERY DAY 90 capsule 1  . quinapril (ACCUPRIL) 10 MG tablet Take 1 tablet (10 mg total) by mouth daily. 90 tablet 1  . RESTASIS 0.05 % ophthalmic emulsion     . traMADol (ULTRAM) 50 MG tablet     . TURMERIC CURCUMIN PO Take by mouth.    . Vitamin D, Ergocalciferol, (DRISDOL) 50000 units  CAPS capsule Take 1 capsule (50,000 Units total) by mouth once a week. 12 capsule 1   No current facility-administered medications for this visit.      .  PHYSICAL EXAMINATION:   Filed Vitals:   09/21/15 1340  BP: 155/81  Pulse: 76  Temp: 98.7 F (37.1 C)  Resp: 18   Filed Weights   09/21/15 1340  Weight: 181 lb 7 oz (82.3 kg)    GENERAL: Well-nourished well-developed; Alert, no distress and comfortable.  Alone.  EYES: no pallor or icterus OROPHARYNX: no thrush or ulceration; good dentition  NECK: supple, no masses felt LYMPH:  no palpable lymphadenopathy in the cervical, axillary or inguinal regions LUNGS: clear to auscultation and  No wheeze or crackles HEART/CVS: regular rate & rhythm and no murmurs; No lower extremity edema ABDOMEN: abdomen soft, non-tender and normal bowel sounds; positive for splenomegaly.  Musculoskeletal:no cyanosis of digits and no clubbing  PSYCH: alert & oriented x 3 with fluent speech NEURO: no focal motor/sensory deficits SKIN:  no rashes or significant lesions  LABORATORY DATA:  I have reviewed the data as listed Lab Results  Component Value Date   WBC 3.6 09/03/2015   HGB 13.1 07/13/2014   HCT 37.9 09/03/2015   MCV 83 09/03/2015   PLT 82* 09/03/2015    Recent Labs  01/18/15 1053 08/25/15 1057  NA 142 142  K 5.2 5.2  CL 98 98  CO2 30* 28  GLUCOSE 130* 133*  BUN 11 10  CREATININE 0.62 0.61  CALCIUM 9.6 9.7  GFRNONAA 88 89  GFRAA 102 103  PROT 6.6 6.8  ALBUMIN 4.2 4.2  AST 27 41*  ALT 24 31  ALKPHOS 63 74  BILITOT 0.5 0.7    RADIOGRAPHIC STUDIES: I have personally reviewed the radiological images as listed and agreed with the findings in the report. Mr Knee Left  Wo Contrast  09/14/2015  CLINICAL DATA:  No surgery, no recent injury, no swelling. EXAM: MRI OF THE LEFT KNEE WITHOUT CONTRAST TECHNIQUE: Multiplanar, multisequence MR imaging of the knee was performed. No intravenous contrast was administered. COMPARISON:   None. FINDINGS: MENISCI Medial meniscus:  Intact. Lateral meniscus: Degeneration of the body and posterior horn of the lateral meniscus with a mild complex tear of the posterior horn of the lateral meniscus extending towards the body. LIGAMENTS Cruciates:  Intact ACL and PCL. Collaterals: Medial collateral ligament is intact. Lateral collateral ligament complex is intact. CARTILAGE Patellofemoral:  No chondral defect. Medial: Mild chondral thinning of the medial femorotibial compartment. Lateral: High-grade partial-thickness cartilage loss with areas of full-thickness cartilage loss involving the lateral tibial plateau with mild subchondral reactive marrow edema. High-grade partial-thickness cartilage loss of the lateral femoral condyle. Joint: No joint effusion. Normal Hoffa's fat. No plical thickening. Popliteal Fossa:  Intact popliteus tendon.  Tiny Baker cyst. Extensor Mechanism:  Intact. Bones: No focal marrow signal   abnormality. No fracture or dislocation. IMPRESSION: 1. Degeneration of the body and posterior horn of the lateral meniscus with a mild complex tear of the posterior horn of the lateral meniscus extending towards the body. 2. High-grade partial-thickness cartilage loss with areas of full-thickness cartilage loss involving the lateral tibial plateau with mild subchondral reactive marrow edema. High-grade partial-thickness cartilage loss of the lateral femoral condyle. Electronically Signed   By: Kathreen Devoid   On: 09/14/2015 11:54    ASSESSMENT & PLAN:   # CHRONIC THROMBOCYTOPENIA- Mild to Moderate- likely secondary to hypersplenism/cirrhosis ? Worsened secondary to clumping. Repeat CBC in citrated tube. We'll also review the peripheral smear.   # Cirrhosis/splenomegaly-she appears compensated. defer to GI/PCP.  # Also asked the patient to bring the name of the herbal medication that she takes at home at the next visit.  All questions were answered. The patient knows to call the clinic  with any problems, questions or concerns. We'll call the patient with the lab results-as the patient was very anxious. She'll follow-up with me in 1 week to review the results in person.  Thank you Dr. Venia Minks for allowing me to participate in the care of your pleasant patient. Please do not hesitate to contact me with questions or concerns in the interim.    Cammie Sickle, MD 09/21/2015 2:11 PM

## 2015-09-21 NOTE — Progress Notes (Signed)
Patient here today as new evaluation regarding thrombocytopenia.  Referred by Dr. Venia Minks.

## 2015-09-22 ENCOUNTER — Telehealth: Payer: Self-pay | Admitting: *Deleted

## 2015-09-22 NOTE — Telephone Encounter (Signed)
   Ref Range 1d ago    WBC 3.6 - 11.0 K/uL 3.2 (L)   RBC 3.80 - 5.20 MIL/uL 4.56   Hemoglobin 12.0 - 16.0 g/dL 12.6   HCT 35.0 - 47.0 % 38.1   MCV 80.0 - 100.0 fL 83.6   MCH 26.0 - 34.0 pg 27.7   MCHC 32.0 - 36.0 g/dL 33.1   RDW 11.5 - 14.5 % 14.8 (H)   Platelets 150 - 440 K/uL 72 (L)   Neutrophils Relative % % 61%   Neutro Abs 1.4 - 6.5 K/uL 1.9   Lymphocytes Relative % 29%   Lymphs Abs 1.0 - 3.6 K/uL 0.9 (L)   Monocytes Relative % 8%   Monocytes Absolute 0.2 - 0.9 K/uL 0.2   Eosinophils Relative % 1%   Eosinophils Absolute 0 - 0.7 K/uL 0.0   Basophils Relative % 1%   Basophils Absolute 0 - 0.1 K/uL 0.0   Resulting Agency [facility]       Specimen Collected: 09/21/15 2:20 PM   Last Resulted: 09/21/15 3:26 PM                        Other Results from 09/21/2015         Sedimentation rate  Status: Finalresult Visible to patient:  Not Released Nextappt: 09/29/2015 at 10:15 AM in Oncology Cammie Sickle, MD) Dx:  Thrombocytopenia (HCC)          Ref Range 1d ago    Sed Rate 0 - 30 mm/hr 11

## 2015-09-22 NOTE — Telephone Encounter (Signed)
I Spoke to pt re: labs. She has been on turmeric/herbal medication. She will see me next week for follow up.  FYI-

## 2015-09-28 ENCOUNTER — Ambulatory Visit: Payer: Commercial Managed Care - HMO | Admitting: Internal Medicine

## 2015-09-29 ENCOUNTER — Inpatient Hospital Stay (HOSPITAL_BASED_OUTPATIENT_CLINIC_OR_DEPARTMENT_OTHER): Payer: Commercial Managed Care - HMO | Admitting: Internal Medicine

## 2015-09-29 ENCOUNTER — Other Ambulatory Visit: Payer: Self-pay | Admitting: *Deleted

## 2015-09-29 VITALS — BP 142/79 | HR 72 | Temp 98.6°F | Wt 180.7 lb

## 2015-09-29 DIAGNOSIS — R161 Splenomegaly, not elsewhere classified: Secondary | ICD-10-CM | POA: Diagnosis not present

## 2015-09-29 DIAGNOSIS — Z79899 Other long term (current) drug therapy: Secondary | ICD-10-CM | POA: Diagnosis not present

## 2015-09-29 DIAGNOSIS — K760 Fatty (change of) liver, not elsewhere classified: Secondary | ICD-10-CM | POA: Diagnosis not present

## 2015-09-29 DIAGNOSIS — M1990 Unspecified osteoarthritis, unspecified site: Secondary | ICD-10-CM | POA: Diagnosis not present

## 2015-09-29 DIAGNOSIS — K219 Gastro-esophageal reflux disease without esophagitis: Secondary | ICD-10-CM | POA: Diagnosis not present

## 2015-09-29 DIAGNOSIS — K7460 Unspecified cirrhosis of liver: Secondary | ICD-10-CM

## 2015-09-29 DIAGNOSIS — D696 Thrombocytopenia, unspecified: Secondary | ICD-10-CM | POA: Diagnosis not present

## 2015-09-29 DIAGNOSIS — E113293 Type 2 diabetes mellitus with mild nonproliferative diabetic retinopathy without macular edema, bilateral: Secondary | ICD-10-CM | POA: Diagnosis not present

## 2015-09-29 DIAGNOSIS — I10 Essential (primary) hypertension: Secondary | ICD-10-CM | POA: Diagnosis not present

## 2015-09-29 NOTE — Progress Notes (Signed)
Patient ambulates without assistance, brought to exam room 5.  Patient denies pain or discomfort, BP 142/79, vitals documented.  Medication record updated, information provided by patient.

## 2015-09-29 NOTE — Progress Notes (Signed)
[place] NOTE  Patient Care Team: Margarita Rana, MD as PCP - General (Family Medicine)  CHIEF COMPLAINTS/PURPOSE OF CONSULTATION:   # THROMBOCYTOPENIA- [2015-100s-80s; 2017] N-Hb/WBC;  Hypersplenism/splenomegaly; no clumping.   #  Cirrhosis/ splenomegaly [US- 2016; stable since 2013]  HISTORY OF PRESENTING ILLNESS:  Jennifer [name] 75 y.o.  female Caucasian patient history of compensated cirrhosis/splenomegaly [based on imaging] has been referred to [place] for further evaluation of thrombocytopenia.  Denies any bleeding gums. Denies any bleeding nose. No blood in stools. No unusual shortness of breath or cough.  ROS: Denies any unusual swelling in the legs. Denies any skin rash. A complete 10 point review of system is done which is negative except mentioned above in history of present illness  MEDICAL HISTORY:  Past Medical History  Diagnosis Date  . Allergy   . Asthma   . Diabetes mellitus without complication ([place])   . Hypertension   . Hyperlipidemia   . GERD (gastroesophageal reflux disease)   . Fatty liver   . Arthritis   . Motion sickness     boats  . Mild nonproliferative diabetic retinopathy of both eyes ([place])     SURGICAL HISTORY: Past Surgical History  Procedure Laterality Date  . Tonsillectomy and adenoidectomy    . Abdominal hysterectomy  1978  . Colonoscopy with propofol N/A 02/01/2015    Procedure: COLONOSCOPY WITH PROPOFOL;  Surgeon: Lucilla Lame, MD;  Location: [facility];  Service: Endoscopy;  Laterality: N/A;  Diabetic - oral meds  . Polypectomy  02/01/2015    Procedure: POLYPECTOMY;  Surgeon: Lucilla Lame, MD;  Location: [facility];  Service: Endoscopy;;    SOCIAL HISTORY: Social History   Social History  . Marital Status: Widowed    Spouse Name: N/A  . Number of Children: N/A  . Years of Education: N/A   Occupational History  . Not on file.   Social History Main Topics  . Smoking status: Former Smoker --  0.50 packs/day for 6 years    Types: Cigarettes    Quit date: 05/08/1987  . Smokeless tobacco: Never Used  . Alcohol Use: No  . Drug Use: No  . Sexual Activity: Not on file   Other Topics Concern  . Not on file   Social History Narrative    FAMILY HISTORY: Family History  Problem Relation Age of Onset  . Dementia Mother   . Alcohol abuse Father   . Cancer Sister     lung  . Lymphoma Sister   . Lymphoma Sister     ALLERGIES:  is allergic to celecoxib; codeine; hydrocodone-acetaminophen; nitrofurantoin monohyd macro; penicillins; and sulfa antibiotics.  MEDICATIONS:  Current Outpatient Prescriptions  Medication Sig Dispense Refill  . Alcohol Swabs (B-D SINGLE USE SWABS REGULAR) PADS     . Blood Glucose Calibration (ACCU-CHEK SMARTVIEW CONTROL) LIQD     . Blood Glucose Monitoring Suppl (ACCU-CHEK NANO SMARTVIEW) W/DEVICE KIT 1 strip by Does not apply route daily. 1 kit 6  . glucose blood (ACCU-CHEK SMARTVIEW) test strip To check blood sugar twice a day.  DX E11.9 200 each 1  . Lancets Misc. (ACCU-CHEK FASTCLIX LANCET) KIT     . metFORMIN (GLUCOPHAGE) 500 MG tablet Take 1 tablet (500 mg total) by mouth 2 (two) times daily with a meal. 180 tablet 1  . MILK THISTLE PO Take by mouth.    Marland Kitchen omeprazole (PRILOSEC) 20 MG capsule TAKE 1 CAPSULE EVERY DAY 90 capsule 1  . quinapril (ACCUPRIL)  10 MG tablet Take 1 tablet (10 mg total) by mouth daily. 90 tablet 1  . RESTASIS 0.05 % ophthalmic emulsion     . traMADol (ULTRAM) 50 MG tablet     . TURMERIC CURCUMIN PO Take by mouth.    . Vitamin D, Ergocalciferol, (DRISDOL) 50000 units CAPS capsule Take 1 capsule (50,000 Units total) by mouth once a week. 12 capsule 1   No current facility-administered medications for this visit.      Marland Kitchen  PHYSICAL EXAMINATION:   Filed Vitals:   09/29/15 1024  BP: 142/79  Pulse: 72  Temp: 98.6 F (37 C)   Filed Weights   09/29/15 1024  Weight: 180 lb 10.7 oz (81.95 kg)    GENERAL:  Well-nourished well-developed; Alert, no distress and comfortable.  Alone.  EYES: no pallor or icterus OROPHARYNX: no thrush or ulceration; good dentition  NECK: supple, no masses felt LYMPH:  no palpable lymphadenopathy in the cervical, axillary or inguinal regions LUNGS: clear to auscultation and  No wheeze or crackles HEART/CVS: regular rate & rhythm and no murmurs; No lower extremity edema ABDOMEN: abdomen soft, non-tender and normal bowel sounds; positive for splenomegaly.  Musculoskeletal:no cyanosis of digits and no clubbing  PSYCH: alert & oriented x 3 with fluent speech NEURO: no focal motor/sensory deficits SKIN:  no rashes or significant lesions  LABORATORY DATA:  I have reviewed the data as listed Lab Results  Component Value Date   WBC 3.2* 09/21/2015   HGB 12.6 09/21/2015   HCT 38.1 09/21/2015   MCV 83.6 09/21/2015   PLT 72* 09/21/2015    Recent Labs  01/18/15 1053 08/25/15 1057  NA 142 142  K 5.2 5.2  CL 98 98  CO2 30* 28  GLUCOSE 130* 133*  BUN 11 10  CREATININE 0.62 0.61  CALCIUM 9.6 9.7  GFRNONAA 88 89  GFRAA 102 103  PROT 6.6 6.8  ALBUMIN 4.2 4.2  AST 27 41*  ALT 24 31  ALKPHOS 63 74  BILITOT 0.5 0.7    RADIOGRAPHIC STUDIES: I have personally reviewed the radiological images as listed and agreed with the findings in the report. Mr Knee Left  Wo Contrast  09/14/2015  CLINICAL DATA:  No surgery, no recent injury, no swelling. EXAM: MRI OF THE LEFT KNEE WITHOUT CONTRAST TECHNIQUE: Multiplanar, multisequence MR imaging of the knee was performed. No intravenous contrast was administered. COMPARISON:  None. FINDINGS: MENISCI Medial meniscus:  Intact. Lateral meniscus: Degeneration of the body and posterior horn of the lateral meniscus with a mild complex tear of the posterior horn of the lateral meniscus extending towards the body. LIGAMENTS Cruciates:  Intact ACL and PCL. Collaterals: Medial collateral ligament is intact. Lateral collateral ligament  complex is intact. CARTILAGE Patellofemoral:  No chondral defect. Medial: Mild chondral thinning of the medial femorotibial compartment. Lateral: High-grade partial-thickness cartilage loss with areas of full-thickness cartilage loss involving the lateral tibial plateau with mild subchondral reactive marrow edema. High-grade partial-thickness cartilage loss of the lateral femoral condyle. Joint: No joint effusion. Normal Hoffa's fat. No plical thickening. Popliteal Fossa:  Intact popliteus tendon.  Tiny Baker cyst. Extensor Mechanism:  Intact. Bones: No focal marrow signal abnormality. No fracture or dislocation. IMPRESSION: 1. Degeneration of the body and posterior horn of the lateral meniscus with a mild complex tear of the posterior horn of the lateral meniscus extending towards the body. 2. High-grade partial-thickness cartilage loss with areas of full-thickness cartilage loss involving the lateral tibial plateau with mild subchondral reactive  marrow edema. High-grade partial-thickness cartilage loss of the lateral femoral condyle. Electronically Signed   By: Kathreen Devoid   On: 09/14/2015 11:54    ASSESSMENT & PLAN:   # CHRONIC THROMBOCYTOPENIA- Mild to Moderate- likely secondary to hypersplenism/cirrhosis. Peripheral smear showed no evidence of clumping. Platelet count 72. Again discussed with the patient that if this continues to drop- I would recommend a bone marrow biopsy. Discussed the procedure; we'll hold off the procedure at this time.  # Left knee pain- awaiting orthopedic evaluation. I think a platelet count of 72 is okay for intra-articular injections. However if she plans to have any major surgeries I would recommend further workup/improving the platelet count.  # Cirrhosis/splenomegaly-she appears compensated. defer to GI/PCP. Also check hepatitis B/ hep C at next visit.   # Reviewed patient's herbal medication- turmeric; milk thistle is okay. Should not cause thrombocytopenia. Patient  takes this for liver disease.  # Patient will follow-up with me in approximately 3 months with labs. She was given a copy of her labs.  # 15 minutes face-to-face with the patient discussing the above plan of care; more than 50% of time spent on natural history; counseling and coordination.      Cammie Sickle, MD 09/29/2015 10:36 AM

## 2015-09-30 DIAGNOSIS — M1712 Unilateral primary osteoarthritis, left knee: Secondary | ICD-10-CM | POA: Diagnosis not present

## 2015-10-12 ENCOUNTER — Other Ambulatory Visit: Payer: Self-pay | Admitting: Family Medicine

## 2015-10-12 DIAGNOSIS — I10 Essential (primary) hypertension: Secondary | ICD-10-CM

## 2015-10-12 DIAGNOSIS — E119 Type 2 diabetes mellitus without complications: Secondary | ICD-10-CM

## 2015-10-12 DIAGNOSIS — K2100 Gastro-esophageal reflux disease with esophagitis, without bleeding: Secondary | ICD-10-CM

## 2015-10-26 ENCOUNTER — Other Ambulatory Visit: Payer: Self-pay | Admitting: Family Medicine

## 2015-10-26 DIAGNOSIS — E119 Type 2 diabetes mellitus without complications: Secondary | ICD-10-CM

## 2015-11-01 ENCOUNTER — Ambulatory Visit (INDEPENDENT_AMBULATORY_CARE_PROVIDER_SITE_OTHER): Payer: Commercial Managed Care - HMO | Admitting: Physician Assistant

## 2015-11-01 ENCOUNTER — Encounter: Payer: Self-pay | Admitting: Physician Assistant

## 2015-11-01 VITALS — BP 140/78 | HR 72 | Temp 98.2°F | Resp 16 | Wt 184.6 lb

## 2015-11-01 DIAGNOSIS — L551 Sunburn of second degree: Secondary | ICD-10-CM

## 2015-11-01 NOTE — Patient Instructions (Signed)
*  Aloe with Lidocaine*   Sunburn Sunburn is damage to the skin caused by overexposure to ultraviolet (UV) rays. People with light skin or a fair complexion may be more susceptible to sunburn. Repeated sun exposure causes early skin aging such as wrinkles and sun spots. It also increases the risk of skin cancer. CAUSES A sunburn is caused by getting too much UV radiation from the sun. SYMPTOMS  Red or pink skin.  Soreness and swelling.  Pain.  Blisters.  Peeling skin.  Headache, fever, and fatigue if sunburn covers a large area. TREATMENT  Your caregiver may tell you to take certain medicines to lessen inflammation.  Your caregiver may have you use hydrocortisone cream or spray to help with itching and inflammation.  Your caregiver may prescribe an antibiotic cream to use on blisters. HOME CARE INSTRUCTIONS   Avoid further exposure to the sun.  Cool baths and cool compresses may be helpful if used several times per day. Do not apply ice, since this may result in more damage to the skin.  Only take over-the-counter or prescription medicines for pain, discomfort, or fever as directed by your caregiver.  Use aloe or other over-the-counter sunburn creams or gels on your skin. Do not apply these creams or gels on blisters.  Drink enough fluids to keep your urine clear or pale yellow.  Do not break blisters. If blisters break, your caregiver may recommend an antibiotic cream to apply to the affected area. PREVENTION   Try to avoid the sun between 10:00 a.m. and 4:00 p.m. when it is the strongest.  Apply sunscreen at least 30 minutes before exposure to the sun.  Always wear protective hats, clothing, and sunglasses with UV protection.  Avoid medicines, herbs, and foods that increase your sensitivity to sunlight.  Avoid tanning beds. SEEK IMMEDIATE MEDICAL CARE IF:   You have a fever.  Your pain is uncontrolled with medicine.  You start to vomit or have diarrhea.  You  feel faint or develop a headache with confusion.  You develop severe blistering.  You have a pus-like (purulent) discharge coming from the blisters.  Your burn becomes more painful and swollen. MAKE SURE YOU:  Understand these instructions.  Will watch your condition.  Will get help right away if you are not doing well or get worse.   This information is not intended to replace advice given to you by your health care provider. Make sure you discuss any questions you have with your health care provider.   Document Released: 02/01/2005 Document Revised: 08/19/2012 Document Reviewed: 10/26/2014 Elsevier Interactive Patient Education [employer].

## 2015-11-01 NOTE — Addendum Note (Signed)
Addended by: Mar Daring on: 11/01/2015 03:34 PM   Modules accepted: Level of Service

## 2015-11-01 NOTE — Progress Notes (Signed)
Patient: Jennifer [name] Female    DOB: 10-24-1940   74 y.o.   MRN: [id number] Visit Date: 11/01/2015  Today's Provider: Mar Daring, PA-C   Chief Complaint  Patient presents with  . Sunburn   Subjective:    HPI Patient is here with c/o of sunburn. Hurts for anything to touch her chest. She applied aloe vera and it helps when ever is on it. She went to the beach Thursday and came back last night. She get burnt on Friday and Saturday. Chest is worst, then back and shoulder, some on the chin. She is starting to develop blisters on her chest.  She does have chills, but denies fevers, nausea, disorientation/confusion, vomiting, decreased urination.  She does have NASH cirrhosis and splenomegaly with thrombocytopenia (most recent platelet count was 72).     Allergies  Allergen Reactions  . Celecoxib Other (See Comments)    Hearing loss while taking  . Codeine     Pt denies  . Hydrocodone-Acetaminophen     Pt denies  . Nitrofurantoin Monohyd Macro     "Sugar got high"  . Penicillins   . Sulfa Antibiotics    Current Meds  Medication Sig  . Alcohol Swabs (B-D SINGLE USE SWABS REGULAR) PADS   . Blood Glucose Calibration (ACCU-CHEK SMARTVIEW CONTROL) LIQD   . Blood Glucose Monitoring Suppl (ACCU-CHEK NANO SMARTVIEW) W/DEVICE KIT 1 strip by Does not apply route daily.  Marland Kitchen glucose blood (ACCU-CHEK SMARTVIEW) test strip Check blood sugar twice a day.  DX: E11.9  . Lancets Misc. (ACCU-CHEK FASTCLIX LANCET) KIT   . metFORMIN (GLUCOPHAGE) 500 MG tablet TAKE 1 TABLET TWICE DAILY WITH A MEAL  . MILK THISTLE PO Take by mouth.  Marland Kitchen omeprazole (PRILOSEC) 20 MG capsule TAKE 1 CAPSULE TWICE DAILY  . quinapril (ACCUPRIL) 10 MG tablet TAKE 1 TABLET EVERY DAY  . RESTASIS 0.05 % ophthalmic emulsion   . traMADol (ULTRAM) 50 MG tablet   . TURMERIC CURCUMIN PO Take by mouth.  . Vitamin D, Ergocalciferol, (DRISDOL) 50000 units CAPS capsule Take 1 capsule (50,000 Units total) by mouth once  a week.    Review of Systems  Constitutional: Positive for chills. Negative for fever and fatigue.  Respiratory: Negative for chest tightness and shortness of breath.   Cardiovascular: Positive for leg swelling (she reports that her ankles are swollen "due to the sitting for a long time"). Negative for chest pain and palpitations.  Gastrointestinal: Negative for nausea and abdominal pain.  Skin: Positive for color change (sunburn).    Social History  Substance Use Topics  . Smoking status: Former Smoker -- 0.50 packs/day for 6 years    Types: Cigarettes    Quit date: 05/08/1987  . Smokeless tobacco: Never Used  . Alcohol Use: No   Objective:   BP 140/78 mmHg  Pulse 72  Temp(Src) 98.2 F (36.8 C) (Oral)  Resp 16  Wt 184 lb 9.6 oz (83.734 kg)  Physical Exam  Constitutional: She appears well-developed and well-nourished. No distress.  Cardiovascular: Normal rate, regular rhythm and normal heart sounds.  Exam reveals no gallop and no friction rub.   No murmur heard. Pulmonary/Chest: Effort normal and breath sounds normal. No respiratory distress. She has no wheezes. She has no rales.  Skin: She is not diaphoretic.     Vitals reviewed.     Assessment & Plan:     1. Sunburn of second degree Due to other co-morbidities we were unable  to give her a Toradol injection due to its anti-platelet effects. Advised to avoid sun, try to stay in cool areas, cold compresses, aloe vera with lidocaine as needed, and cool showers. She is to call if symptoms worsen.       Mar Daring, PA-C  [facility]

## 2015-12-02 ENCOUNTER — Ambulatory Visit (INDEPENDENT_AMBULATORY_CARE_PROVIDER_SITE_OTHER): Payer: Commercial Managed Care - HMO | Admitting: Family Medicine

## 2015-12-02 ENCOUNTER — Encounter: Payer: Self-pay | Admitting: Family Medicine

## 2015-12-02 VITALS — BP 120/88 | HR 84 | Temp 98.6°F | Resp 16 | Wt 182.0 lb

## 2015-12-02 DIAGNOSIS — J4532 Mild persistent asthma with status asthmaticus: Secondary | ICD-10-CM | POA: Diagnosis not present

## 2015-12-02 DIAGNOSIS — J40 Bronchitis, not specified as acute or chronic: Secondary | ICD-10-CM | POA: Diagnosis not present

## 2015-12-02 MED ORDER — ALBUTEROL SULFATE HFA 108 (90 BASE) MCG/ACT IN AERS: 2.0000 | INHALATION_SPRAY | Freq: Four times a day (QID) | RESPIRATORY_TRACT | 2 refills | Status: DC | PRN

## 2015-12-02 MED ORDER — DM-GUAIFENESIN ER 30-600 MG PO TB12: 1.0000 | ORAL_TABLET | Freq: Two times a day (BID) | ORAL | 0 refills | Status: DC

## 2015-12-02 MED ORDER — AZITHROMYCIN 250 MG PO TABS
ORAL_TABLET | ORAL | 0 refills | Status: DC
Start: 2015-12-02 — End: 2015-12-02

## 2015-12-02 MED ORDER — AZITHROMYCIN 250 MG PO TABS: ORAL_TABLET | ORAL | 0 refills | Status: DC

## 2015-12-02 NOTE — Progress Notes (Signed)
Patient: Jennifer [name] Female    DOB: [birth year]   74 y.o.   MRN: [id number] Visit Date: 12/02/2015  Today's Provider: Vernie Murders, PA   Chief Complaint  Patient presents with  . Cough   Subjective:    Cough  This is a new problem. The current episode started in the past 7 days (4 days). The problem has been gradually improving. The problem occurs every few minutes. Associated symptoms include chest pain, chills, a fever, headaches, postnasal drip, sweats and wheezing. Pertinent negatives include no ear pain, heartburn, hemoptysis, myalgias, nasal congestion, rash, rhinorrhea, sore throat, shortness of breath or weight loss. The symptoms are aggravated by lying down. Treatments tried: gargled mouth rince for dry throat. The treatment provided mild relief.   Past Medical History:  Diagnosis Date  . Allergy   . Arthritis   . Asthma   . Diabetes mellitus without complication ([place])   . Fatty liver   . GERD (gastroesophageal reflux disease)   . Hyperlipidemia   . Hypertension   . Mild nonproliferative diabetic retinopathy of both eyes ([place])   . Motion sickness    boats   Past Surgical History:  Procedure Laterality Date  . ABDOMINAL HYSTERECTOMY  1978  . COLONOSCOPY WITH PROPOFOL N/A 02/01/2015   Procedure: COLONOSCOPY WITH PROPOFOL;  Surgeon: Lucilla Lame, MD;  Location: [facility];  Service: Endoscopy;  Laterality: N/A;  Diabetic - oral meds  . POLYPECTOMY  02/01/2015   Procedure: POLYPECTOMY;  Surgeon: Lucilla Lame, MD;  Location: [facility];  Service: Endoscopy;;  . TONSILLECTOMY AND ADENOIDECTOMY     Family History  Problem Relation Age of Onset  . Dementia Mother   . Alcohol abuse Father   . Cancer Sister     lung  . Lymphoma Sister   . Lymphoma Sister      Allergies  Allergen Reactions  . Celecoxib Other (See Comments)    Hearing loss while taking  . Codeine     Pt denies  . Hydrocodone-Acetaminophen     Pt denies  . Nitrofurantoin  Monohyd Macro     "Sugar got high"  . Penicillins   . Sulfa Antibiotics    Current Meds  Medication Sig  . Alcohol Swabs (B-D SINGLE USE SWABS REGULAR) PADS   . Blood Glucose Calibration (ACCU-CHEK SMARTVIEW CONTROL) LIQD   . Blood Glucose Monitoring Suppl (ACCU-CHEK NANO SMARTVIEW) W/DEVICE KIT 1 strip by Does not apply route daily.  Marland Kitchen glucose blood (ACCU-CHEK SMARTVIEW) test strip Check blood sugar twice a day.  DX: E11.9  . Lancets Misc. (ACCU-CHEK FASTCLIX LANCET) KIT   . metFORMIN (GLUCOPHAGE) 500 MG tablet TAKE 1 TABLET TWICE DAILY WITH A MEAL  . MILK THISTLE PO Take by mouth.  Marland Kitchen omeprazole (PRILOSEC) 20 MG capsule TAKE 1 CAPSULE TWICE DAILY  . quinapril (ACCUPRIL) 10 MG tablet TAKE 1 TABLET EVERY DAY  . RESTASIS 0.05 % ophthalmic emulsion   . traMADol (ULTRAM) 50 MG tablet   . TURMERIC CURCUMIN PO Take by mouth.  . Vitamin D, Ergocalciferol, (DRISDOL) 50000 units CAPS capsule Take 1 capsule (50,000 Units total) by mouth once a week.    Review of Systems  Constitutional: Positive for chills and fever. Negative for appetite change, fatigue and weight loss.  HENT: Positive for congestion and postnasal drip. Negative for ear pain, rhinorrhea and sore throat.   Respiratory: Positive for cough and wheezing. Negative for hemoptysis, chest tightness and shortness of breath.  Cardiovascular: Positive for chest pain. Negative for palpitations.  Gastrointestinal: Negative for abdominal pain, heartburn, nausea and vomiting.  Musculoskeletal: Negative for myalgias.  Skin: Negative for rash.  Neurological: Positive for headaches. Negative for dizziness and weakness.    Social History  Substance Use Topics  . Smoking status: Former Smoker    Packs/day: 0.50    Years: 6.00    Types: Cigarettes    Quit date: 05/08/1987  . Smokeless tobacco: Never Used  . Alcohol use No   Objective:   BP 120/88 (BP Location: Left Arm, Patient Position: Sitting, Cuff Size: Normal)   Pulse 84   Temp  98.6 F (37 C) (Oral)   Resp 16   Wt 182 lb (82.6 kg)   SpO2 90%   BMI 34.39 kg/m   Physical Exam  Constitutional: She is oriented to person, place, and time. She appears well-developed and well-nourished.  HENT:  Head: Normocephalic.  Right Ear: External ear normal.  Left Ear: External ear normal.  Slightly cobblestone appearance to posterior pharynx without exudates. Slightly reddened nasal membranes. Very tender left maxillary sinus.  Eyes: Conjunctivae are normal.  Slight redness nasal corner of left lower eyelid.  Neck: Neck supple.  Cardiovascular: Normal rate and regular rhythm.   Pulmonary/Chest: Effort normal and breath sounds normal.  Abdominal: Soft. Bowel sounds are normal.  Lymphadenopathy:    She has no cervical adenopathy.  Neurological: She is alert and oriented to person, place, and time.  Psychiatric: She has a normal mood and affect. Her behavior is normal.      Assessment & Plan:     1. Bronchitis Onset over the past couple days with cough and some wheeze. States she had a fever of 102 once at the onset. No significant sputum production but having tenderness in the left maxillary sinus. Will treat with Mucinex-DM and Z-pak. Increase fluid intake and recheck if no better in 5-7 days. - azithromycin (ZITHROMAX) 250 MG tablet; Take 2 tablets by mouth today then one daily for 4 days.  Dispense: 6 tablet; Refill: 0 - dextromethorphan-guaiFENesin (MUCINEX DM) 30-600 MG 12hr tablet; Take 1 tablet by mouth 2 (two) times daily.  Dispense: 20 tablet; Refill: 0  2. Airway hyperreactivity, mild persistent, with status asthmaticus Wheezing yesterday and soreness in abdominal muscles with hacking cough. Use Mucinex-DM and add Albuterol-HFA inhaler. Recheck prn. - albuterol (PROVENTIL HFA;VENTOLIN HFA) 108 (90 Base) MCG/ACT inhaler; Inhale 2 puffs into the lungs every 6 (six) hours as needed for wheezing or shortness of breath.  Dispense: 1 Inhaler; Refill: [place], PA  [facility]

## 2015-12-02 NOTE — Addendum Note (Signed)
Addended by: Julieta Bellini on: 12/02/2015 10:03 AM   Modules accepted: Orders

## 2015-12-02 NOTE — Patient Instructions (Signed)

## 2015-12-03 ENCOUNTER — Telehealth: Payer: Self-pay | Admitting: *Deleted

## 2015-12-03 NOTE — Telephone Encounter (Signed)
Patient wants rx called into [facility].

## 2015-12-03 NOTE — Telephone Encounter (Signed)
Patient is requesting a different cough syrup then the one she was prescribed at ov yesterday. Patient stated that she wants the chlorpheniramine-HYDROcodone [facility]) 10-8 MG/5ML SURE. She has taken this cough syrup in the past and it works best for her. Please advise?

## 2015-12-03 NOTE — Telephone Encounter (Signed)
Please review. Thanks!  

## 2015-12-05 ENCOUNTER — Other Ambulatory Visit: Payer: Self-pay | Admitting: Family Medicine

## 2015-12-05 DIAGNOSIS — E119 Type 2 diabetes mellitus without complications: Secondary | ICD-10-CM

## 2015-12-05 MED ORDER — ACCU-CHEK FASTCLIX LANCET KIT: PACK | 3 refills | Status: DC

## 2015-12-05 MED ORDER — GLUCOSE BLOOD VI STRP: ORAL_STRIP | 3 refills | Status: DC

## 2015-12-06 ENCOUNTER — Other Ambulatory Visit: Payer: Self-pay | Admitting: Family Medicine

## 2015-12-06 ENCOUNTER — Encounter: Payer: Self-pay | Admitting: *Deleted

## 2015-12-06 DIAGNOSIS — R059 Cough, unspecified: Secondary | ICD-10-CM

## 2015-12-06 DIAGNOSIS — R05 Cough: Secondary | ICD-10-CM

## 2015-12-06 MED ORDER — HYDROCOD POLST-CPM POLST ER 10-8 MG/5ML PO SUER: 5.0000 mL | Freq: Two times a day (BID) | ORAL | 0 refills | Status: DC | PRN

## 2015-12-06 NOTE — Telephone Encounter (Signed)
Patient was notified. Patient stated that she is no longer allergic to hydrocodone and codeine. She had a reaction years ago but has since taken cough syrup with codeine and had no reaction.

## 2015-12-06 NOTE — Telephone Encounter (Signed)
Will send order for Tussionex to the [facility]. The azithromycin given was enough to continue a therapeutic blood level for a full 10 days. If still having symptoms in 4-5 days, should recheck in the office.

## 2015-12-06 NOTE — Telephone Encounter (Signed)
Be sure patient does not have an actual allergy to codeine and hydrocodone because that is what is in the Tussionex cough syrup. If not allergic remove these from her drug allergy list. Will put order in chart for phone in if this is not a true allergy.

## 2015-12-06 NOTE — Progress Notes (Signed)
Rx called in to pharmacy. 

## 2015-12-06 NOTE — Telephone Encounter (Signed)
Patient stated that she finished azithromycin this morning. Patient stated that azithromycin helped a lot however she is still coughing. Patient would like an rx for the cough syrup and also requested another rx for azithromycin.

## 2015-12-07 NOTE — Telephone Encounter (Signed)
Pt states per [facility] the Rx for chlorpheniramine-HYDROcodone [facility]) 10-8 MG/5ML SURE will need to be a written Rx.  CB#[phone]/MW

## 2015-12-07 NOTE — Telephone Encounter (Signed)
Please review. Thanks!  

## 2015-12-29 ENCOUNTER — Inpatient Hospital Stay: Payer: Commercial Managed Care - HMO | Attending: Internal Medicine | Admitting: Internal Medicine

## 2015-12-29 ENCOUNTER — Encounter: Payer: Self-pay | Admitting: Internal Medicine

## 2015-12-29 ENCOUNTER — Inpatient Hospital Stay: Payer: Commercial Managed Care - HMO

## 2015-12-29 VITALS — BP 136/78 | HR 81 | Temp 98.4°F | Wt 183.0 lb

## 2015-12-29 DIAGNOSIS — D731 Hypersplenism: Secondary | ICD-10-CM | POA: Diagnosis not present

## 2015-12-29 DIAGNOSIS — Z7984 Long term (current) use of oral hypoglycemic drugs: Secondary | ICD-10-CM | POA: Insufficient documentation

## 2015-12-29 DIAGNOSIS — R161 Splenomegaly, not elsewhere classified: Secondary | ICD-10-CM | POA: Insufficient documentation

## 2015-12-29 DIAGNOSIS — J45909 Unspecified asthma, uncomplicated: Secondary | ICD-10-CM | POA: Insufficient documentation

## 2015-12-29 DIAGNOSIS — Z87891 Personal history of nicotine dependence: Secondary | ICD-10-CM | POA: Diagnosis not present

## 2015-12-29 DIAGNOSIS — I10 Essential (primary) hypertension: Secondary | ICD-10-CM | POA: Insufficient documentation

## 2015-12-29 DIAGNOSIS — E785 Hyperlipidemia, unspecified: Secondary | ICD-10-CM | POA: Diagnosis not present

## 2015-12-29 DIAGNOSIS — M25562 Pain in left knee: Secondary | ICD-10-CM | POA: Insufficient documentation

## 2015-12-29 DIAGNOSIS — D696 Thrombocytopenia, unspecified: Secondary | ICD-10-CM

## 2015-12-29 DIAGNOSIS — K7460 Unspecified cirrhosis of liver: Secondary | ICD-10-CM | POA: Diagnosis not present

## 2015-12-29 DIAGNOSIS — K219 Gastro-esophageal reflux disease without esophagitis: Secondary | ICD-10-CM | POA: Insufficient documentation

## 2015-12-29 DIAGNOSIS — M1990 Unspecified osteoarthritis, unspecified site: Secondary | ICD-10-CM | POA: Insufficient documentation

## 2015-12-29 DIAGNOSIS — Z79899 Other long term (current) drug therapy: Secondary | ICD-10-CM | POA: Diagnosis not present

## 2015-12-29 DIAGNOSIS — E119 Type 2 diabetes mellitus without complications: Secondary | ICD-10-CM | POA: Insufficient documentation

## 2015-12-29 LAB — COMPREHENSIVE METABOLIC PANEL
ALT: 23 U/L (ref 14–54)
ANION GAP: 6 (ref 5–15)
AST: 34 U/L (ref 15–41)
Albumin: 4 g/dL (ref 3.5–5.0)
Alkaline Phosphatase: 52 U/L (ref 38–126)
BILIRUBIN TOTAL: 1.2 mg/dL (ref 0.3–1.2)
BUN: 17 mg/dL (ref 6–20)
CO2: 28 mmol/L (ref 22–32)
Calcium: 9.4 mg/dL (ref 8.9–10.3)
Chloride: 101 mmol/L (ref 101–111)
Creatinine, Ser: 0.61 mg/dL (ref 0.44–1.00)
GFR calc Af Amer: >60 mL/min (ref >60)
Glucose, Bld: 139 mg/dL — ABNORMAL HIGH (ref 65–99)
POTASSIUM: 3.8 mmol/L (ref 3.5–5.1)
Sodium: 135 mmol/L (ref 135–145)
TOTAL PROTEIN: 6.9 g/dL (ref 6.5–8.1)

## 2015-12-29 LAB — CBC WITH DIFFERENTIAL/PLATELET
Basophils Absolute: 0 10*3/uL (ref 0–0.1)
Basophils Relative: 0 %
Eosinophils Absolute: 0.1 10*3/uL (ref 0–0.7)
Eosinophils Relative: 1 %
HEMATOCRIT: 35.1 % (ref 35.0–47.0)
Hemoglobin: 11.7 g/dL — ABNORMAL LOW (ref 12.0–16.0)
Lymphs Abs: 1.2 10*3/uL (ref 1.0–3.6)
MCH: 27.8 pg (ref 26.0–34.0)
MCHC: 33.3 g/dL (ref 32.0–36.0)
MCV: 83.4 fL (ref 80.0–100.0)
MONO ABS: 0.3 10*3/uL (ref 0.2–0.9)
NEUTROS ABS: 2.8 10*3/uL (ref 1.4–6.5)
Neutrophils Relative %: 63 %
Platelets: 82 10*3/uL — ABNORMAL LOW (ref 150–440)
RBC: 4.21 MIL/uL (ref 3.80–5.20)
RDW: 15.4 % — AB (ref 11.5–14.5)
WBC: 4.4 10*3/uL (ref 3.6–11.0)

## 2015-12-29 NOTE — Progress Notes (Signed)
[facility] NOTE  Patient Care Team: Margo Common, PA as PCP - General (Family Medicine)  CHIEF COMPLAINTS/PURPOSE OF CONSULTATION:   # THROMBOCYTOPENIA- [2015-100s-80s; 2017] N-Hb/WBC;  Hypersplenism/splenomegaly; no clumping.   #  Cirrhosis/ splenomegaly [US- 2016; stable since 2013]  HISTORY OF PRESENTING ILLNESS:  Jennifer [name] 75 y.o.  female Caucasian patient history of compensated cirrhosis/splenomegaly [based on imaging] - is here for follow-up of her thrombocytopenia.  Continues to deny any bleeding.  Denies any bleeding gums. Denies any bleeding nose. No blood in stools. No unusual shortness of breath or cough. No swelling in the legs.  Noted to have a mole on the left hip/been there for 2 years not changing.   ROS: Denies any unusual swelling in the legs. Denies any skin rash. A complete 10 point review of system is done which is negative except mentioned above in history of present illness  MEDICAL HISTORY:  Past Medical History:  Diagnosis Date  . Allergy   . Arthritis   . Asthma   . Diabetes mellitus without complication ([place])   . Fatty liver   . GERD (gastroesophageal reflux disease)   . Hyperlipidemia   . Hypertension   . Mild nonproliferative diabetic retinopathy of both eyes ([place])   . Motion sickness    boats    SURGICAL HISTORY: Past Surgical History:  Procedure Laterality Date  . ABDOMINAL HYSTERECTOMY  1978  . COLONOSCOPY WITH PROPOFOL N/A 02/01/2015   Procedure: COLONOSCOPY WITH PROPOFOL;  Surgeon: Lucilla Lame, MD;  Location: [facility];  Service: Endoscopy;  Laterality: N/A;  Diabetic - oral meds  . POLYPECTOMY  02/01/2015   Procedure: POLYPECTOMY;  Surgeon: Lucilla Lame, MD;  Location: [facility];  Service: Endoscopy;;  . TONSILLECTOMY AND ADENOIDECTOMY      SOCIAL HISTORY: Social History   Social History  . Marital status: Widowed    Spouse name: N/A  . Number of children: N/A  . Years of  education: N/A   Occupational History  . Not on file.   Social History Main Topics  . Smoking status: Former Smoker    Packs/day: 0.50    Years: 6.00    Types: Cigarettes    Quit date: 05/08/1987  . Smokeless tobacco: Never Used  . Alcohol use No  . Drug use: No  . Sexual activity: Not on file   Other Topics Concern  . Not on file   Social History Narrative  . No narrative on file    FAMILY HISTORY: Family History  Problem Relation Age of Onset  . Dementia Mother   . Alcohol abuse Father   . Cancer Sister     lung  . Lymphoma Sister   . Lymphoma Sister     ALLERGIES:  is allergic to celecoxib; codeine; hydrocodone-acetaminophen; nitrofurantoin monohyd macro; penicillins; sulfa antibiotics; and ciprofloxacin.  MEDICATIONS:  Current Outpatient Prescriptions  Medication Sig Dispense Refill  . albuterol (PROVENTIL HFA;VENTOLIN HFA) 108 (90 Base) MCG/ACT inhaler Inhale 2 puffs into the lungs every 6 (six) hours as needed for wheezing or shortness of breath. 1 Inhaler 2  . Alcohol Swabs (B-D SINGLE USE SWABS REGULAR) PADS     . azithromycin (ZITHROMAX) 250 MG tablet Take 2 tablets by mouth today then one daily for 4 days. 6 tablet 0  . bismuth subsalicylate (PEPTO BISMOL) 262 MG/15ML suspension     . Blood Glucose Calibration (ACCU-CHEK SMARTVIEW CONTROL) LIQD     . Blood Glucose Monitoring Suppl (ACCU-CHEK  NANO SMARTVIEW) W/DEVICE KIT 1 strip by Does not apply route daily. 1 kit 6  . chlorpheniramine-HYDROcodone (TUSSIONEX PENNKINETIC ER) 10-8 MG/5ML SUER Take 5 mLs by mouth every 12 (twelve) hours as needed for cough. 115 mL 0  . glucose blood (ACCU-CHEK SMARTVIEW) test strip Check fasting blood sugar once a day.  DX: E11.9 100 each 3  . Lancets Misc. (ACCU-CHEK FASTCLIX LANCET) KIT Test fasting blood sugar daily. 100 kit 3  . metFORMIN (GLUCOPHAGE) 500 MG tablet TAKE 1 TABLET TWICE DAILY WITH A MEAL 180 tablet 1  . MILK THISTLE PO Take by mouth.    Marland Kitchen omeprazole  (PRILOSEC) 20 MG capsule TAKE 1 CAPSULE TWICE DAILY 180 capsule 1  . quinapril (ACCUPRIL) 10 MG tablet TAKE 1 TABLET EVERY DAY 90 tablet 1  . RESTASIS 0.05 % ophthalmic emulsion     . TURMERIC CURCUMIN PO Take by mouth.    . Vitamin D, Ergocalciferol, (DRISDOL) 50000 units CAPS capsule Take 1 capsule (50,000 Units total) by mouth once a week. 12 capsule 1   No current facility-administered medications for this visit.       Marland Kitchen  PHYSICAL EXAMINATION:   Vitals:   12/29/15 1127  BP: 136/78  Pulse: 81  Temp: 98.4 F (36.9 C)   Filed Weights   12/29/15 1127  Weight: 182 lb 15.7 oz (83 kg)    GENERAL: Well-nourished well-developed; Alert, no distress and comfortable.  Alone.  EYES: no pallor or icterus OROPHARYNX: no thrush or ulceration; good dentition  NECK: supple, no masses felt LYMPH:  no palpable lymphadenopathy in the cervical, axillary or inguinal regions LUNGS: clear to auscultation and  No wheeze or crackles HEART/CVS: regular rate & rhythm and no murmurs; No lower extremity edema ABDOMEN: abdomen soft, non-tender and normal bowel sounds; positive for splenomegaly.  Musculoskeletal:no cyanosis of digits and no clubbing  PSYCH: alert & oriented x 3 with fluent speech NEURO: no focal motor/sensory deficits SKIN:  no rashes or significant lesions  LABORATORY DATA:  I have reviewed the data as listed Lab Results  Component Value Date   WBC 4.4 12/29/2015   HGB 11.7 (L) 12/29/2015   HCT 35.1 12/29/2015   MCV 83.4 12/29/2015   PLT 82 (L) 12/29/2015    Recent Labs  01/18/15 1053 08/25/15 1057 12/29/15 1007  NA 142 142 135  K 5.2 5.2 3.8  CL 98 98 101  CO2 30* 28 28  GLUCOSE 130* 133* 139*  BUN 11 10 17   CREATININE 0.62 0.61 0.61  CALCIUM 9.6 9.7 9.4  GFRNONAA 88 89 >60  GFRAA 102 103 >60  PROT 6.6 6.8 6.9  ALBUMIN 4.2 4.2 4.0  AST 27 41* 34  ALT 24 31 23   ALKPHOS 63 74 52  BILITOT 0.5 0.7 1.2    RADIOGRAPHIC STUDIES: I have personally reviewed the  radiological images as listed and agreed with the findings in the report. No results found.  ASSESSMENT & PLAN:   # CHRONIC THROMBOCYTOPENIA- Mild to Moderate- likely secondary to hypersplenism/cirrhosis. Peripheral smear showed no evidence of clumping. Platelet count 72. Again discussed with the patient that if this continues to drop- I would recommend a bone marrow biopsy. Discussed the procedure; we'll hold off the procedure at this time.  # Left knee pain- awaiting orthopedic evaluation. I think a platelet count of 72 is okay for intra-articular injections. However if she plans to have any major surgeries I would recommend further workup/improving the platelet count.  # Cirrhosis/splenomegaly-she appears compensated.  defer to GI/PCP. Also check hepatitis B/ hep C at next visit.   # Reviewed patient's herbal medication- turmeric; milk thistle is okay. Should not cause thrombocytopenia. Patient takes this for liver disease.  # Patient will follow-up with me in approximately 3 months with labs. She was given a copy of her labs.  # 15 minutes face-to-face with the patient discussing the above plan of care; more than 50% of time spent on natural history; counseling and coordination.      Cammie Sickle, MD 12/29/2015 12:55 PM    [facility] NOTE  Patient Care Team: Margo Common, PA as PCP - General (Family Medicine)  CHIEF COMPLAINTS/PURPOSE OF CONSULTATION:   # THROMBOCYTOPENIA- [2015-100s-80s; 2017] N-Hb/WBC;  Hypersplenism/splenomegaly; no clumping.   #  Cirrhosis/ splenomegaly [US- 2016; stable since 2013]  HISTORY OF PRESENTING ILLNESS:  Jennifer [name] 75 y.o.  female Caucasian patient history of compensated cirrhosis/splenomegaly [based on imaging] has been referred to [place] for further evaluation of thrombocytopenia.  Denies any bleeding gums. Denies any bleeding nose. No blood in stools. No unusual shortness of breath or cough.  ROS:  Denies any unusual swelling in the legs. Denies any skin rash. A complete 10 point review of system is done which is negative except mentioned above in history of present illness  MEDICAL HISTORY:  Past Medical History:  Diagnosis Date  . Allergy   . Arthritis   . Asthma   . Diabetes mellitus without complication ([place])   . Fatty liver   . GERD (gastroesophageal reflux disease)   . Hyperlipidemia   . Hypertension   . Mild nonproliferative diabetic retinopathy of both eyes ([place])   . Motion sickness    boats    SURGICAL HISTORY: Past Surgical History:  Procedure Laterality Date  . ABDOMINAL HYSTERECTOMY  1978  . COLONOSCOPY WITH PROPOFOL N/A 02/01/2015   Procedure: COLONOSCOPY WITH PROPOFOL;  Surgeon: Lucilla Lame, MD;  Location: [facility];  Service: Endoscopy;  Laterality: N/A;  Diabetic - oral meds  . POLYPECTOMY  02/01/2015   Procedure: POLYPECTOMY;  Surgeon: Lucilla Lame, MD;  Location: [facility];  Service: Endoscopy;;  . TONSILLECTOMY AND ADENOIDECTOMY      SOCIAL HISTORY: Social History   Social History  . Marital status: Widowed    Spouse name: N/A  . Number of children: N/A  . Years of education: N/A   Occupational History  . Not on file.   Social History Main Topics  . Smoking status: Former Smoker    Packs/day: 0.50    Years: 6.00    Types: Cigarettes    Quit date: 05/08/1987  . Smokeless tobacco: Never Used  . Alcohol use No  . Drug use: No  . Sexual activity: Not on file   Other Topics Concern  . Not on file   Social History Narrative  . No narrative on file    FAMILY HISTORY: Family History  Problem Relation Age of Onset  . Dementia Mother   . Alcohol abuse Father   . Cancer Sister     lung  . Lymphoma Sister   . Lymphoma Sister     ALLERGIES:  is allergic to celecoxib; codeine; hydrocodone-acetaminophen; nitrofurantoin monohyd macro; penicillins; sulfa antibiotics; and ciprofloxacin.  MEDICATIONS:  Current Outpatient  Prescriptions  Medication Sig Dispense Refill  . albuterol (PROVENTIL HFA;VENTOLIN HFA) 108 (90 Base) MCG/ACT inhaler Inhale 2 puffs into the lungs every 6 (six) hours as needed for wheezing or shortness  of breath. 1 Inhaler 2  . Alcohol Swabs (B-D SINGLE USE SWABS REGULAR) PADS     . azithromycin (ZITHROMAX) 250 MG tablet Take 2 tablets by mouth today then one daily for 4 days. 6 tablet 0  . bismuth subsalicylate (PEPTO BISMOL) 262 MG/15ML suspension     . Blood Glucose Calibration (ACCU-CHEK SMARTVIEW CONTROL) LIQD     . Blood Glucose Monitoring Suppl (ACCU-CHEK NANO SMARTVIEW) W/DEVICE KIT 1 strip by Does not apply route daily. 1 kit 6  . chlorpheniramine-HYDROcodone (TUSSIONEX PENNKINETIC ER) 10-8 MG/5ML SUER Take 5 mLs by mouth every 12 (twelve) hours as needed for cough. 115 mL 0  . glucose blood (ACCU-CHEK SMARTVIEW) test strip Check fasting blood sugar once a day.  DX: E11.9 100 each 3  . Lancets Misc. (ACCU-CHEK FASTCLIX LANCET) KIT Test fasting blood sugar daily. 100 kit 3  . metFORMIN (GLUCOPHAGE) 500 MG tablet TAKE 1 TABLET TWICE DAILY WITH A MEAL 180 tablet 1  . MILK THISTLE PO Take by mouth.    Marland Kitchen omeprazole (PRILOSEC) 20 MG capsule TAKE 1 CAPSULE TWICE DAILY 180 capsule 1  . quinapril (ACCUPRIL) 10 MG tablet TAKE 1 TABLET EVERY DAY 90 tablet 1  . RESTASIS 0.05 % ophthalmic emulsion     . TURMERIC CURCUMIN PO Take by mouth.    . Vitamin D, Ergocalciferol, (DRISDOL) 50000 units CAPS capsule Take 1 capsule (50,000 Units total) by mouth once a week. 12 capsule 1   No current facility-administered medications for this visit.       Marland Kitchen  PHYSICAL EXAMINATION:   Vitals:   12/29/15 1127  BP: 136/78  Pulse: 81  Temp: 98.4 F (36.9 C)   Filed Weights   12/29/15 1127  Weight: 182 lb 15.7 oz (83 kg)    GENERAL: Well-nourished well-developed; Alert, no distress and comfortable.  Alone.  EYES: no pallor or icterus OROPHARYNX: no thrush or ulceration; good dentition  NECK:  supple, no masses felt LYMPH:  no palpable lymphadenopathy in the cervical, axillary or inguinal regions LUNGS: clear to auscultation and  No wheeze or crackles HEART/CVS: regular rate & rhythm and no murmurs; No lower extremity edema ABDOMEN: abdomen soft, non-tender and normal bowel sounds; positive for splenomegaly.  Musculoskeletal:no cyanosis of digits and no clubbing  PSYCH: alert & oriented x 3 with fluent speech NEURO: no focal motor/sensory deficits SKIN:  no rashes; About 2 cm raised more on the left hip- not changing.   LABORATORY DATA:  I have reviewed the data as listed Lab Results  Component Value Date   WBC 4.4 12/29/2015   HGB 11.7 (L) 12/29/2015   HCT 35.1 12/29/2015   MCV 83.4 12/29/2015   PLT 82 (L) 12/29/2015    Recent Labs  01/18/15 1053 08/25/15 1057 12/29/15 1007  NA 142 142 135  K 5.2 5.2 3.8  CL 98 98 101  CO2 30* 28 28  GLUCOSE 130* 133* 139*  BUN 11 10 17   CREATININE 0.62 0.61 0.61  CALCIUM 9.6 9.7 9.4  GFRNONAA 88 89 >60  GFRAA 102 103 >60  PROT 6.6 6.8 6.9  ALBUMIN 4.2 4.2 4.0  AST 27 41* 34  ALT 24 31 23   ALKPHOS 63 74 52  BILITOT 0.5 0.7 1.2    RADIOGRAPHIC STUDIES: I have personally reviewed the radiological images as listed and agreed with the findings in the report. No results found.  ASSESSMENT & PLAN:  Thrombocytopenia ([place]) # Thrombocytopenia sec to spleen/cirrhosis. Stable - 89. Not any worse.  Asymptomatic. Awaiting hepatitis workup from today.  # Cirrhosis/no decompensation.   # Mole on the right hip- not changing. Discussed the potential concerning symptoms- recommend evaluation with dermatology if changing.  # [phone]/cell- call re: hep work up.   # follow up 6 months/labs        Cammie Sickle, MD 12/29/2015 12:55 PM

## 2015-12-29 NOTE — Assessment & Plan Note (Addendum)
#   Thrombocytopenia sec to spleen/cirrhosis. Stable - 89. Not any worse. Asymptomatic. Awaiting hepatitis workup from today.  # Cirrhosis/no decompensation.   # Mole on the right hip- not changing. Discussed the potential concerning symptoms- recommend evaluation with dermatology if changing.  # [phone]/cell- call re: hep work up.   # follow up 6 months/labs

## 2015-12-30 LAB — HEPATITIS B SURFACE ANTIGEN: HEP B S AG: NEGATIVE

## 2015-12-30 LAB — HEPATITIS C ANTIBODY

## 2015-12-31 ENCOUNTER — Telehealth: Payer: Self-pay | Admitting: Family Medicine

## 2015-12-31 NOTE — Telephone Encounter (Signed)
Pt would like to get a handicap sticker form completed. Pt stated that she has pain walking because of her torn ligament in knee and she needs surgery but can't have surgery due to low blood platelets. Pt was advised that Simona Huh is out of the office this week and will return Monday 01/03/16. Pt was also advised this could require and OV. Please advise. Thanks TNP

## 2016-01-03 ENCOUNTER — Telehealth: Payer: Self-pay | Admitting: *Deleted

## 2016-01-03 NOTE — Telephone Encounter (Signed)
Called patient to inform her that hepatitis work up was negative.  Verbalized understanding.

## 2016-01-03 NOTE — Telephone Encounter (Signed)
-----   Message from Cammie Sickle, MD sent at 12/31/2015  6:56 PM EDT ----- Please inform patient with hepatitis workup was negative.

## 2016-01-03 NOTE — Telephone Encounter (Signed)
Have forms at the office and need appointment to document need as we complete form.

## 2016-01-04 NOTE — Telephone Encounter (Signed)
Appt scheduled to have forms filled out.

## 2016-01-06 ENCOUNTER — Ambulatory Visit (INDEPENDENT_AMBULATORY_CARE_PROVIDER_SITE_OTHER): Payer: Commercial Managed Care - HMO | Admitting: Family Medicine

## 2016-01-06 ENCOUNTER — Encounter: Payer: Self-pay | Admitting: Family Medicine

## 2016-01-06 VITALS — BP 118/82 | HR 74 | Temp 98.1°F | Resp 16 | Wt 187.2 lb

## 2016-01-06 DIAGNOSIS — M1712 Unilateral primary osteoarthritis, left knee: Secondary | ICD-10-CM | POA: Diagnosis not present

## 2016-01-06 DIAGNOSIS — D485 Neoplasm of uncertain behavior of skin: Secondary | ICD-10-CM

## 2016-01-06 DIAGNOSIS — D696 Thrombocytopenia, unspecified: Secondary | ICD-10-CM

## 2016-01-06 NOTE — Progress Notes (Signed)
Patient: Jennifer [name] Female    DOB: 1940-09-24   75 y.o.   MRN: [id number] Visit Date: 01/06/2016  Today's Provider: Vernie Murders, PA   No chief complaint on file.  Subjective:    HPI Patient is requesting a handicap form be completed. Patient states that she has pain walking because of her torn ligament in knee, and she needs surgery.  She said she can't have surgery due to low blood platelets. Patient's diagnosis is osteoarthritis of left knee.  Referral: Patient is requesting a dermatology referral for a skin problem in scalp and back of leg.     Past Medical History:  Diagnosis Date  . Allergy   . Arthritis   . Asthma   . Diabetes mellitus without complication ([place])   . Fatty liver   . GERD (gastroesophageal reflux disease)   . Hyperlipidemia   . Hypertension   . Mild nonproliferative diabetic retinopathy of both eyes ([place])   . Motion sickness    boats   Past Surgical History:  Procedure Laterality Date  . ABDOMINAL HYSTERECTOMY  1978  . COLONOSCOPY WITH PROPOFOL N/A 02/01/2015   Procedure: COLONOSCOPY WITH PROPOFOL;  Surgeon: Lucilla Lame, MD;  Location: [facility];  Service: Endoscopy;  Laterality: N/A;  Diabetic - oral meds  . POLYPECTOMY  02/01/2015   Procedure: POLYPECTOMY;  Surgeon: Lucilla Lame, MD;  Location: [facility];  Service: Endoscopy;;  . TONSILLECTOMY AND ADENOIDECTOMY     Family History  Problem Relation Age of Onset  . Dementia Mother   . Alcohol abuse Father   . Cancer Sister     lung  . Lymphoma Sister   . Lymphoma Sister    Allergies  Allergen Reactions  . Celecoxib Other (See Comments)    Hearing loss while taking  . Codeine Other (See Comments)    Pt denies  . Hydrocodone-Acetaminophen Other (See Comments)    Pt denies  . Nitrofurantoin Monohyd Macro     "Sugar got high"  . Penicillins   . Sulfa Antibiotics   . Ciprofloxacin Rash   Previous Medications   ALBUTEROL (PROVENTIL HFA;VENTOLIN HFA) 108 (90 BASE)  MCG/ACT INHALER    Inhale 2 puffs into the lungs every 6 (six) hours as needed for wheezing or shortness of breath.   ALCOHOL SWABS (B-D SINGLE USE SWABS REGULAR) PADS       AZITHROMYCIN (ZITHROMAX) 250 MG TABLET    Take 2 tablets by mouth today then one daily for 4 days.   BISMUTH SUBSALICYLATE (PEPTO BISMOL) 262 MG/15ML SUSPENSION       BLOOD GLUCOSE CALIBRATION (ACCU-CHEK SMARTVIEW CONTROL) LIQD       BLOOD GLUCOSE MONITORING SUPPL (ACCU-CHEK NANO SMARTVIEW) W/DEVICE KIT    1 strip by Does not apply route daily.   CHLORPHENIRAMINE-HYDROCODONE (TUSSIONEX PENNKINETIC ER) 10-8 MG/5ML SUER    Take 5 mLs by mouth every 12 (twelve) hours as needed for cough.   GLUCOSE BLOOD (ACCU-CHEK SMARTVIEW) TEST STRIP    Check fasting blood sugar once a day.  DX: E11.9   LANCETS MISC. (ACCU-CHEK FASTCLIX LANCET) KIT    Test fasting blood sugar daily.   METFORMIN (GLUCOPHAGE) 500 MG TABLET    TAKE 1 TABLET TWICE DAILY WITH A MEAL   MILK THISTLE PO    Take by mouth.   OMEPRAZOLE (PRILOSEC) 20 MG CAPSULE    TAKE 1 CAPSULE TWICE DAILY   QUINAPRIL (ACCUPRIL) 10 MG TABLET    TAKE 1 TABLET EVERY DAY  RESTASIS 0.05 % OPHTHALMIC EMULSION       TURMERIC CURCUMIN PO    Take by mouth.   VITAMIN D, ERGOCALCIFEROL, (DRISDOL) 50000 UNITS CAPS CAPSULE    Take 1 capsule (50,000 Units total) by mouth once a week.    Review of Systems  Constitutional: Negative.   Respiratory: Negative.   Musculoskeletal: Positive for gait problem.    Social History  Substance Use Topics  . Smoking status: Former Smoker    Packs/day: 0.50    Years: 6.00    Types: Cigarettes    Quit date: 05/08/1987  . Smokeless tobacco: Never Used  . Alcohol use No   Objective:   BP 118/82 (BP Location: Right Arm, Patient Position: Sitting, Cuff Size: Large)   Pulse 74   Temp 98.1 F (36.7 C) (Oral)   Resp 16   Wt 187 lb 3.2 oz (84.9 kg)   BMI 35.37 kg/m   Physical Exam  Constitutional: She is oriented to person, place, and time. She  appears well-developed and well-nourished. No distress.  HENT:  Head: Normocephalic and atraumatic.  Right Ear: Hearing normal.  Left Ear: Hearing normal.  Nose: Nose normal.  Eyes: Conjunctivae and lids are normal. Right eye exhibits no discharge. Left eye exhibits no discharge. No scleral icterus.  Neck: Neck supple.  Cardiovascular: Normal rate and regular rhythm.   Pulmonary/Chest: Effort normal and breath sounds normal. No respiratory distress.  Abdominal: Bowel sounds are normal.  Musculoskeletal: Normal range of motion. She exhibits tenderness.  Tender left lateral knee with loose lateral ligament and crepitus. No effusion.  Neurological: She is alert and oriented to person, place, and time.  Skin: Skin is intact. No lesion and no rash noted.  Large 2.0 - 2.5 cm lesions that are flesh colored on right parietal scalp and left lateral calf. Also, a brown 1.5 - 1.75 cm waxy lesion on left upper posterior thigh/buttock.  Psychiatric: She has a normal mood and affect. Her speech is normal and behavior is normal. Thought content normal.      Assessment & Plan:  1. Primary osteoarthritis of left knee History of chronic left knee pain secondary to degenerative arthritis of the lateral compartment and tear of the lateral meniscus per Dr. Leanor Kail (orthopedist) from MRI  09-30-15. Has had arthritis pain and left knee giving away for 4-5 years. Rheumatologist (Dr. Cristi Loron) has given cortisone injections with short term relief. Hematologist warned against surgical intervention due to thrombocytopenia. Will get handicap placard to help with shortening ambulation going to doctor's appointments or shopping.  2. Thrombocytopenia ([place]) Followed by Dr. Rogue Bussing (hematologist).  3. Neoplasm of uncertain behavior of scalp Lesion on scalp has been present for a couple years but is getting larger. Also, seeing similar lesions on the left leg. Schedule dermatology referral for possible  cryotherapy. - Ambulatory referral to Dermatology

## 2016-01-12 DIAGNOSIS — L820 Inflamed seborrheic keratosis: Secondary | ICD-10-CM | POA: Diagnosis not present

## 2016-02-14 DIAGNOSIS — M1712 Unilateral primary osteoarthritis, left knee: Secondary | ICD-10-CM | POA: Diagnosis not present

## 2016-02-19 ENCOUNTER — Ambulatory Visit (INDEPENDENT_AMBULATORY_CARE_PROVIDER_SITE_OTHER): Payer: Commercial Managed Care - HMO

## 2016-02-19 DIAGNOSIS — Z23 Encounter for immunization: Secondary | ICD-10-CM | POA: Diagnosis not present

## 2016-02-21 ENCOUNTER — Other Ambulatory Visit: Payer: Self-pay | Admitting: Family Medicine

## 2016-02-21 DIAGNOSIS — Z1231 Encounter for screening mammogram for malignant neoplasm of breast: Secondary | ICD-10-CM

## 2016-02-23 ENCOUNTER — Other Ambulatory Visit: Payer: Self-pay | Admitting: Family Medicine

## 2016-02-23 DIAGNOSIS — I10 Essential (primary) hypertension: Secondary | ICD-10-CM

## 2016-02-23 DIAGNOSIS — E119 Type 2 diabetes mellitus without complications: Secondary | ICD-10-CM

## 2016-02-23 DIAGNOSIS — K2100 Gastro-esophageal reflux disease with esophagitis, without bleeding: Secondary | ICD-10-CM

## 2016-03-13 ENCOUNTER — Other Ambulatory Visit: Payer: Self-pay | Admitting: Family Medicine

## 2016-03-13 DIAGNOSIS — E119 Type 2 diabetes mellitus without complications: Secondary | ICD-10-CM

## 2016-03-17 ENCOUNTER — Ambulatory Visit (INDEPENDENT_AMBULATORY_CARE_PROVIDER_SITE_OTHER): Payer: Commercial Managed Care - HMO | Admitting: Family Medicine

## 2016-03-17 ENCOUNTER — Encounter: Payer: Self-pay | Admitting: Family Medicine

## 2016-03-17 VITALS — BP 124/74 | HR 82 | Temp 98.3°F | Resp 18 | Wt 188.0 lb

## 2016-03-17 DIAGNOSIS — J069 Acute upper respiratory infection, unspecified: Secondary | ICD-10-CM | POA: Insufficient documentation

## 2016-03-17 DIAGNOSIS — J45902 Unspecified asthma with status asthmaticus: Secondary | ICD-10-CM | POA: Diagnosis not present

## 2016-03-17 HISTORY — DX: Acute upper respiratory infection, unspecified: J06.9

## 2016-03-17 MED ORDER — DM-GUAIFENESIN ER 30-600 MG PO TB12: 1.0000 | ORAL_TABLET | Freq: Two times a day (BID) | ORAL | 0 refills | Status: DC

## 2016-03-17 MED ORDER — AZITHROMYCIN 250 MG PO TABS: ORAL_TABLET | ORAL | 0 refills | Status: DC

## 2016-03-17 NOTE — Progress Notes (Signed)
Patient: Jennifer [name] Female    DOB: 1941-02-17   75 y.o.   MRN: [id number] Visit Date: 03/17/2016  Today's Provider: Vernie Murders, PA   Chief Complaint  Patient presents with  . Cough   Subjective:    HPI   Cough- Pt reports that she has had this cough for about 2 weeks. She does not have any sinus congestion. She denies any other cold symptoms. But she does have chest tightness, shortness of breath and a deep cough. She tried Mucinex but it gave her a headache so she stopped taking it. Had fever up to 100.2 -  100.3 at onset.     Past Medical History:  Diagnosis Date  . Allergy   . Arthritis   . Asthma   . Diabetes mellitus without complication ([place])   . Fatty liver   . GERD (gastroesophageal reflux disease)   . Hyperlipidemia   . Hypertension   . Mild nonproliferative diabetic retinopathy of both eyes ([place])   . Motion sickness    boats   Past Surgical History:  Procedure Laterality Date  . ABDOMINAL HYSTERECTOMY  1978  . COLONOSCOPY WITH PROPOFOL N/A 02/01/2015   Procedure: COLONOSCOPY WITH PROPOFOL;  Surgeon: Lucilla Lame, MD;  Location: [facility];  Service: Endoscopy;  Laterality: N/A;  Diabetic - oral meds  . POLYPECTOMY  02/01/2015   Procedure: POLYPECTOMY;  Surgeon: Lucilla Lame, MD;  Location: [facility];  Service: Endoscopy;;  . TONSILLECTOMY AND ADENOIDECTOMY     Family History  Problem Relation Age of Onset  . Dementia Mother   . Alcohol abuse Father   . Cancer Sister     lung  . Lymphoma Sister   . Lymphoma Sister     Allergies  Allergen Reactions  . Celecoxib Other (See Comments)    Hearing loss while taking  . Codeine Other (See Comments)    Pt denies  . Hydrocodone-Acetaminophen Other (See Comments)    Pt denies  . Nitrofurantoin Monohyd Macro     "Sugar got high"  . Penicillins   . Sulfa Antibiotics   . Ciprofloxacin Rash    Current Outpatient Prescriptions:  .  ACCU-CHEK SMARTVIEW test strip, CHECK BLOOD  SUGAR TWICE DAILY, Disp: 200 each, Rfl: 1 .  albuterol (PROVENTIL HFA;VENTOLIN HFA) 108 (90 Base) MCG/ACT inhaler, Inhale 2 puffs into the lungs every 6 (six) hours as needed for wheezing or shortness of breath., Disp: 1 Inhaler, Rfl: 2 .  Alcohol Swabs (B-D SINGLE USE SWABS REGULAR) PADS, , Disp: , Rfl:  .  Blood Glucose Calibration (ACCU-CHEK SMARTVIEW CONTROL) LIQD, , Disp: , Rfl:  .  Blood Glucose Monitoring Suppl (ACCU-CHEK NANO SMARTVIEW) W/DEVICE KIT, 1 strip by Does not apply route daily., Disp: 1 kit, Rfl: 6 .  Lancets Misc. (ACCU-CHEK FASTCLIX LANCET) KIT, Test fasting blood sugar daily., Disp: 100 kit, Rfl: 3 .  metFORMIN (GLUCOPHAGE) 500 MG tablet, TAKE 1 TABLET TWICE DAILY WITH A MEAL, Disp: 180 tablet, Rfl: 1 .  MILK THISTLE PO, Take by mouth., Disp: , Rfl:  .  omeprazole (PRILOSEC) 20 MG capsule, TAKE 1 CAPSULE TWICE DAILY, Disp: 180 capsule, Rfl: 1 .  quinapril (ACCUPRIL) 10 MG tablet, TAKE 1 TABLET EVERY DAY, Disp: 90 tablet, Rfl: 1 .  RESTASIS 0.05 % ophthalmic emulsion, , Disp: , Rfl:  .  TURMERIC CURCUMIN PO, Take by mouth., Disp: , Rfl:  .  Vitamin D, Ergocalciferol, (DRISDOL) 50000 units CAPS capsule, Take 1  capsule (50,000 Units total) by mouth once a week., Disp: 12 capsule, Rfl: 1  Review of Systems  Constitutional: Positive for chills.  HENT: Positive for congestion.   Eyes: Negative.   Respiratory: Positive for cough, chest tightness and shortness of breath.   Cardiovascular: Negative.   Gastrointestinal: Negative.   Endocrine: Negative.   Genitourinary: Negative.   Musculoskeletal: Negative.   Skin: Negative.   Allergic/Immunologic: Negative.   Neurological: Negative.   Hematological: Negative.   Psychiatric/Behavioral: Negative.     Social History  Substance Use Topics  . Smoking status: Former Smoker    Packs/day: 0.50    Years: 6.00    Types: Cigarettes    Quit date: 05/08/1987  . Smokeless tobacco: Never Used  . Alcohol use No   Objective:    BP 124/74 (BP Location: Right Arm, Patient Position: Sitting, Cuff Size: Normal)   Pulse 82   Temp 98.3 F (36.8 C) (Oral)   Resp 18   Wt 188 lb (85.3 kg)   SpO2 95%   BMI 35.52 kg/m   Physical Exam  Constitutional: She is oriented to person, place, and time. She appears well-developed and well-nourished. No distress.  HENT:  Head: Normocephalic and atraumatic.  Right Ear: Hearing and external ear normal.  Left Ear: Hearing and external ear normal.  Nose: Nose normal.  Mouth/Throat: Oropharynx is clear and moist.  Fluid line behind left TM. No redness or drainage.   Eyes: Conjunctivae and lids are normal. Right eye exhibits no discharge. Left eye exhibits no discharge. No scleral icterus.  Neck: Normal range of motion. Neck supple.  Cardiovascular: Normal rate and regular rhythm.   Pulmonary/Chest: Effort normal and breath sounds normal. No respiratory distress.  Slightly coarse breath sounds without wheeze today.  Abdominal: Soft. Bowel sounds are normal.  Musculoskeletal: Normal range of motion.  Neurological: She is alert and oriented to person, place, and time.  Skin: Skin is intact. No lesion and no rash noted.  Psychiatric: She has a normal mood and affect. Her speech is normal and behavior is normal. Thought content normal.      Assessment & Plan:     1. Upper respiratory tract infection, unspecified type Onset over the past 2 weeks with some fever initially. Coarse breath sounds with fluid behind the left TM. Will treat with Z-pak and Mucinex-DM. Recheck if no better in 7-10 days. Will check CBC with diff with prolonged cough and fever. - azithromycin (ZITHROMAX) 250 MG tablet; Take 2 tablets by mouth today then one daily.  Dispense: 6 tablet; Refill: 0 - dextromethorphan-guaiFENesin (MUCINEX DM) 30-600 MG 12hr tablet; Take 1 tablet by mouth 2 (two) times daily.  Dispense: 20 tablet; Refill: 0  2. Mild asthma with status asthmaticus, unspecified whether persistent Some  chest tightness with cough. Will treat with Mucinex-DM and Albuterol inhaler prn. Increase fluid intake and recheck prn. May need CXR if no better soon. - dextromethorphan-guaiFENesin (MUCINEX DM) 30-600 MG 12hr tablet; Take 1 tablet by mouth 2 (two) times daily.  Dispense: 20 tablet; Refill: [place], [place]  [facility]

## 2016-03-18 LAB — CBC WITH DIFFERENTIAL/PLATELET
BASOS: 0 %
Basophils Absolute: 0 10*3/uL (ref 0.0–0.2)
EOS (ABSOLUTE): 0.1 10*3/uL (ref 0.0–0.4)
EOS: 2 %
HEMATOCRIT: 29.1 % — AB (ref 34.0–46.6)
HEMOGLOBIN: 9 g/dL — AB (ref 11.1–15.9)
IMMATURE GRANS (ABS): 0 10*3/uL (ref 0.0–0.1)
IMMATURE GRANULOCYTES: 0 %
LYMPHS: 30 %
Lymphocytes Absolute: 1 10*3/uL (ref 0.7–3.1)
MCH: 23.5 pg — ABNORMAL LOW (ref 26.6–33.0)
MCHC: 30.9 g/dL — AB (ref 31.5–35.7)
MCV: 76 fL — ABNORMAL LOW (ref 79–97)
MONOS ABS: 0.3 10*3/uL (ref 0.1–0.9)
Monocytes: 9 %
NEUTROS PCT: 59 %
Neutrophils Absolute: 1.9 10*3/uL (ref 1.4–7.0)
Platelets: 85 10*3/uL — CL (ref 150–379)
RBC: 3.83 x10E6/uL (ref 3.77–5.28)
RDW: 16.3 % — AB (ref 12.3–15.4)
WBC: 3.2 10*3/uL — AB (ref 3.4–10.8)

## 2016-03-20 ENCOUNTER — Telehealth: Payer: Self-pay

## 2016-03-20 NOTE — Telephone Encounter (Signed)
-----  Message from Margo Common, [place] sent at 03/20/2016  7:56 AM EST ----- CBC shows microcytic hypochromic anemia with low platelets. Low platelets not as bad as in the past but Hgb and Hct lowest ever. Recommend checking stool with OC-Light kit for any blood loss and asking lab to run total iron and IBC. Probable reason for difficulty getting over infections. Pending results, may need to follow up with hematologist (Dr. Rogue Bussing).

## 2016-03-20 NOTE — Telephone Encounter (Signed)
Spoke with patient on the phone and advised, she request that you order new lab slip for additional labs. Patient states that she had labs drawn at [facility]. OC-Light kit will be left up front for patients grandson to pick up. KW

## 2016-03-21 ENCOUNTER — Ambulatory Visit
Admission: RE | Admit: 2016-03-21 | Discharge: 2016-03-21 | Disposition: A | Discharge status: Home / Self Care | Payer: Commercial Managed Care - HMO | Source: Ambulatory Visit | Attending: Family Medicine | Admitting: Family Medicine

## 2016-03-21 DIAGNOSIS — Z1231 Encounter for screening mammogram for malignant neoplasm of breast: Secondary | ICD-10-CM

## 2016-03-21 LAB — IFOBT (OCCULT BLOOD): IMMUNOLOGICAL FECAL OCCULT BLOOD TEST: NEGATIVE

## 2016-03-22 ENCOUNTER — Other Ambulatory Visit: Payer: Self-pay | Admitting: *Deleted

## 2016-03-22 DIAGNOSIS — Z1211 Encounter for screening for malignant neoplasm of colon: Secondary | ICD-10-CM

## 2016-03-24 ENCOUNTER — Telehealth: Payer: Self-pay

## 2016-03-24 NOTE — Telephone Encounter (Signed)
Attempted to contact patient. No answer nor voicemail.

## 2016-03-24 NOTE — Telephone Encounter (Signed)
Patient advised.

## 2016-03-24 NOTE — Telephone Encounter (Signed)
-----   Message from Margo Common, [place] sent at 03/24/2016  2:04 PM EST ----- Negative test for blood in stools. Need Slow Iron qd with multivitamin and recheck with hematologist Dr. Rogue Bussing because hemoglobin below 10.

## 2016-03-28 DIAGNOSIS — E113293 Type 2 diabetes mellitus with mild nonproliferative diabetic retinopathy without macular edema, bilateral: Secondary | ICD-10-CM | POA: Diagnosis not present

## 2016-03-28 DIAGNOSIS — H04129 Dry eye syndrome of unspecified lacrimal gland: Secondary | ICD-10-CM | POA: Diagnosis not present

## 2016-03-28 LAB — HM DIABETES EYE EXAM

## 2016-05-23 ENCOUNTER — Encounter: Payer: Self-pay | Admitting: Family Medicine

## 2016-05-23 ENCOUNTER — Ambulatory Visit (INDEPENDENT_AMBULATORY_CARE_PROVIDER_SITE_OTHER): Payer: Medicare HMO | Admitting: Family Medicine

## 2016-05-23 VITALS — BP 128/76 | HR 75 | Temp 98.1°F | Resp 16 | Wt 185.4 lb

## 2016-05-23 DIAGNOSIS — R609 Edema, unspecified: Secondary | ICD-10-CM

## 2016-05-23 DIAGNOSIS — R600 Localized edema: Secondary | ICD-10-CM

## 2016-05-23 NOTE — Progress Notes (Signed)
 Patient: Jennifer [name] Female    DOB: 09/13/1940   76 y.o.   MRN: [id number] Visit Date: 05/23/2016  Today's Provider:  , PA   Chief Complaint  Patient presents with  . Leg Swelling   Subjective:    HPI Leg Swelling:    Patient presents with bilateral leg swelling.Onset: 2-3 weeks ago. There was no injury mechanism. The swelling is present in the left leg and right leg. The patient is experiencing no pain. The swelling has been fluctuating since onset. She states swelling is worse at night, but has improved since onset.   Patient Active Problem List   Diagnosis Date Noted  . Upper respiratory infection 03/17/2016  . Thrombocytopenia (HCC) 12/29/2015  . Primary osteoarthritis of left knee 09/30/2015  . Special screening for malignant neoplasms, colon   . Benign neoplasm of ascending colon   . Benign neoplasm of transverse colon   . Benign neoplasm of sigmoid colon   . Arthritis 10/21/2014  . Allergic rhinitis 10/14/2014  . Airway hyperreactivity 10/14/2014  . Back ache 10/14/2014  . Bell palsy 10/14/2014  . Controlled diabetes mellitus type II without complication (HCC) 10/14/2014  . Elevation of level of transaminase or lactic acid dehydrogenase (LDH) 10/14/2014  . Calculus of gallbladder 10/14/2014  . HLD (hyperlipidemia) 10/14/2014  . BP (high blood pressure) 10/14/2014  . Gonalgia 10/14/2014  . Benign neoplasm of soft tissues 10/14/2014  . NASH (nonalcoholic steatohepatitis) 10/14/2014  . Adiposity 10/14/2014  . Esophagitis, reflux 10/14/2014  . Enlargement of spleen 10/14/2014  . Change in blood platelet count 10/14/2014  . Avitaminosis D 10/14/2014   Past Surgical History:  Procedure Laterality Date  . ABDOMINAL HYSTERECTOMY  1978  . COLONOSCOPY WITH PROPOFOL N/A 02/01/2015   Procedure: COLONOSCOPY WITH PROPOFOL;  Surgeon: Darren Wohl, MD;  Location: [facility];  Service: Endoscopy;  Laterality: N/A;  Diabetic - oral meds  . POLYPECTOMY   02/01/2015   Procedure: POLYPECTOMY;  Surgeon: Darren Wohl, MD;  Location: [facility];  Service: Endoscopy;;  . TONSILLECTOMY AND ADENOIDECTOMY     Family History  Problem Relation Age of Onset  . Dementia Mother   . Alcohol abuse Father   . Cancer Sister     lung  . Lymphoma Sister   . Lymphoma Sister    Allergies  Allergen Reactions  . Celecoxib Other (See Comments)    Hearing loss while taking  . Codeine Other (See Comments)    Pt denies  . Hydrocodone-Acetaminophen Other (See Comments)    Pt denies  . Nitrofurantoin Monohyd Macro     "Sugar got high"  . Penicillins   . Sulfa Antibiotics   . Ciprofloxacin Rash     Previous Medications   ACCU-CHEK SMARTVIEW TEST STRIP    CHECK BLOOD SUGAR TWICE DAILY   ALBUTEROL (PROVENTIL HFA;VENTOLIN HFA) 108 (90 BASE) MCG/ACT INHALER    Inhale 2 puffs into the lungs every 6 (six) hours as needed for wheezing or shortness of breath.   ALCOHOL SWABS (B-D SINGLE USE SWABS REGULAR) PADS       BLOOD GLUCOSE CALIBRATION (ACCU-CHEK SMARTVIEW CONTROL) LIQD       BLOOD GLUCOSE MONITORING SUPPL (ACCU-CHEK NANO SMARTVIEW) W/DEVICE KIT    1 strip by Does not apply route daily.   LANCETS MISC. (ACCU-CHEK FASTCLIX LANCET) KIT    Test fasting blood sugar daily.   METFORMIN (GLUCOPHAGE) 500 MG TABLET    TAKE 1 TABLET TWICE DAILY WITH A MEAL   MILK   THISTLE PO    Take by mouth.   OMEPRAZOLE (PRILOSEC) 20 MG CAPSULE    TAKE 1 CAPSULE TWICE DAILY   QUINAPRIL (ACCUPRIL) 10 MG TABLET    TAKE 1 TABLET EVERY DAY   RESTASIS 0.05 % OPHTHALMIC EMULSION       TURMERIC CURCUMIN PO    Take by mouth.   VITAMIN D, ERGOCALCIFEROL, (DRISDOL) 50000 UNITS CAPS CAPSULE    Take 1 capsule (50,000 Units total) by mouth once a week.    Review of Systems  Constitutional: Negative.   Respiratory: Negative.   Cardiovascular: Positive for leg swelling.    Social History  Substance Use Topics  . Smoking status: Former Smoker    Packs/day: 0.50    Years: 6.00     Types: Cigarettes    Quit date: 05/08/1987  . Smokeless tobacco: Never Used  . Alcohol use No   Objective:   BP 128/76 (BP Location: Right Arm, Patient Position: Sitting, Cuff Size: Normal)   Pulse 75   Temp 98.1 F (36.7 C) (Oral)   Resp 16   Wt 185 lb 6.4 oz (84.1 kg)   SpO2 96%   BMI 35.03 kg/m  Wt Readings from Last 3 Encounters:  05/23/16 185 lb 6.4 oz (84.1 kg)  03/17/16 188 lb (85.3 kg)  01/06/16 187 lb 3.2 oz (84.9 kg)    Physical Exam  Constitutional: She is oriented to person, place, and time. She appears well-developed and well-nourished. No distress.  HENT:  Head: Normocephalic and atraumatic.  Right Ear: Hearing normal.  Left Ear: Hearing normal.  Nose: Nose normal.  Eyes: Conjunctivae and lids are normal. Right eye exhibits no discharge. Left eye exhibits no discharge. No scleral icterus.  Neck: Neck supple. No thyromegaly present.  Cardiovascular: Normal rate and regular rhythm.   Pulmonary/Chest: Effort normal and breath sounds normal. No respiratory distress.  Abdominal: Soft. Bowel sounds are normal.  Musculoskeletal: She exhibits no edema.  Lymphadenopathy:    She has no cervical adenopathy.  Neurological: She is alert and oriented to person, place, and time.  Skin: Skin is intact. No lesion and no rash noted.  Psychiatric: She has a normal mood and affect. Her speech is normal and behavior is normal. Thought content normal.      Assessment & Plan:     1. Mild peripheral edema Intermittent puffiness by the end of the day. Gone with elevation and by morning with elevation in the bed. Suspect some venous insufficiency. Limit salt intake, exercise and wear support hose prn. No chest pains or dyspnea. Recheck prn.

## 2016-06-27 ENCOUNTER — Other Ambulatory Visit: Payer: Self-pay | Admitting: *Deleted

## 2016-06-27 DIAGNOSIS — D696 Thrombocytopenia, unspecified: Secondary | ICD-10-CM

## 2016-06-30 ENCOUNTER — Inpatient Hospital Stay: Payer: Medicare HMO

## 2016-06-30 ENCOUNTER — Inpatient Hospital Stay: Payer: Medicare HMO | Attending: Internal Medicine | Admitting: Internal Medicine

## 2016-06-30 DIAGNOSIS — I10 Essential (primary) hypertension: Secondary | ICD-10-CM | POA: Insufficient documentation

## 2016-06-30 DIAGNOSIS — D696 Thrombocytopenia, unspecified: Secondary | ICD-10-CM

## 2016-06-30 DIAGNOSIS — D2272 Melanocytic nevi of left lower limb, including hip: Secondary | ICD-10-CM

## 2016-06-30 DIAGNOSIS — K219 Gastro-esophageal reflux disease without esophagitis: Secondary | ICD-10-CM | POA: Diagnosis not present

## 2016-06-30 DIAGNOSIS — R161 Splenomegaly, not elsewhere classified: Secondary | ICD-10-CM

## 2016-06-30 DIAGNOSIS — J45909 Unspecified asthma, uncomplicated: Secondary | ICD-10-CM | POA: Insufficient documentation

## 2016-06-30 DIAGNOSIS — D6959 Other secondary thrombocytopenia: Secondary | ICD-10-CM | POA: Diagnosis not present

## 2016-06-30 DIAGNOSIS — Z87891 Personal history of nicotine dependence: Secondary | ICD-10-CM | POA: Insufficient documentation

## 2016-06-30 DIAGNOSIS — K7460 Unspecified cirrhosis of liver: Secondary | ICD-10-CM | POA: Diagnosis not present

## 2016-06-30 DIAGNOSIS — Z79899 Other long term (current) drug therapy: Secondary | ICD-10-CM

## 2016-06-30 DIAGNOSIS — E785 Hyperlipidemia, unspecified: Secondary | ICD-10-CM | POA: Diagnosis not present

## 2016-06-30 LAB — COMPREHENSIVE METABOLIC PANEL
ALT: 27 U/L (ref 14–54)
AST: 47 U/L — AB (ref 15–41)
Albumin: 3.7 g/dL (ref 3.5–5.0)
Alkaline Phosphatase: 75 U/L (ref 38–126)
Anion gap: 7 (ref 5–15)
BUN: 12 mg/dL (ref 6–20)
CHLORIDE: 101 mmol/L (ref 101–111)
CO2: 28 mmol/L (ref 22–32)
CREATININE: 0.54 mg/dL (ref 0.44–1.00)
Calcium: 9.1 mg/dL (ref 8.9–10.3)
GFR calc non Af Amer: >60 mL/min (ref >60)
GLUCOSE: 148 mg/dL — AB (ref 65–99)
Potassium: 3.8 mmol/L (ref 3.5–5.1)
SODIUM: 136 mmol/L (ref 135–145)
Total Bilirubin: 1 mg/dL (ref 0.3–1.2)
Total Protein: 6.9 g/dL (ref 6.5–8.1)

## 2016-06-30 LAB — CBC WITH DIFFERENTIAL/PLATELET
BASOS PCT: 0 %
Basophils Absolute: 0 10*3/uL (ref 0–0.1)
EOS ABS: 0 10*3/uL (ref 0–0.7)
EOS PCT: 1 %
HCT: 34 % — ABNORMAL LOW (ref 35.0–47.0)
HEMOGLOBIN: 11.3 g/dL — AB (ref 12.0–16.0)
LYMPHS ABS: 0.8 10*3/uL — AB (ref 1.0–3.6)
Lymphocytes Relative: 34 %
MCH: 26.8 pg (ref 26.0–34.0)
MCHC: 33.3 g/dL (ref 32.0–36.0)
MCV: 80.7 fL (ref 80.0–100.0)
MONO ABS: 0.2 10*3/uL (ref 0.2–0.9)
MONOS PCT: 8 %
NEUTROS PCT: 57 %
Neutro Abs: 1.4 10*3/uL (ref 1.4–6.5)
Platelets: 71 10*3/uL — ABNORMAL LOW (ref 150–440)
RBC: 4.22 MIL/uL (ref 3.80–5.20)
RDW: 17.5 % — AB (ref 11.5–14.5)
WBC: 2.4 10*3/uL — ABNORMAL LOW (ref 3.6–11.0)

## 2016-06-30 NOTE — Progress Notes (Signed)
[name] NOTE  Patient Care Team: Margo Common, PA as PCP - General (Family Medicine)  CHIEF COMPLAINTS/PURPOSE OF CONSULTATION:   # THROMBOCYTOPENIA- [2015-100s-80s; 2017] N-Hb/WBC;  Hypersplenism/splenomegaly; no clumping.   #  Cirrhosis/ splenomegaly [US- 2016; stable since 2013]  HISTORY OF PRESENTING ILLNESS:  Jennifer [name] 76 y.o.  female patient with history of compensated cirrhosis/splenomegaly [based on imaging] - is here for follow-up of her thrombocytopenia.  Continues to deny any bleeding.  Denies any bleeding gums. Denies any bleeding nose. No blood in stools. No blood in urine. No unusual shortness of breath or cough. No swelling in the legs.  Noted to have a mole on the left hip/been there for 2 years, not changing.   ROS: Denies any unusual swelling in the legs. Denies any skin rash or itching. A complete 10 point review of system is done which is negative except mentioned above in history of present illness  MEDICAL HISTORY:  Past Medical History:  Diagnosis Date  . Allergy   . Arthritis   . Asthma   . Diabetes mellitus without complication ([place])   . Fatty liver   . GERD (gastroesophageal reflux disease)   . Hyperlipidemia   . Hypertension   . Mild nonproliferative diabetic retinopathy of both eyes ([place])   . Motion sickness    boats    SURGICAL HISTORY: Past Surgical History:  Procedure Laterality Date  . ABDOMINAL HYSTERECTOMY  1978  . COLONOSCOPY WITH PROPOFOL N/A 02/01/2015   Procedure: COLONOSCOPY WITH PROPOFOL;  Surgeon: Lucilla Lame, MD;  Location: [facility];  Service: Endoscopy;  Laterality: N/A;  Diabetic - oral meds  . POLYPECTOMY  02/01/2015   Procedure: POLYPECTOMY;  Surgeon: Lucilla Lame, MD;  Location: [facility];  Service: Endoscopy;;  . TONSILLECTOMY AND ADENOIDECTOMY      SOCIAL HISTORY: Social History   Social History  . Marital status: Widowed    Spouse name: N/A  . Number of  children: N/A  . Years of education: N/A   Occupational History  . Not on file.   Social History Main Topics  . Smoking status: Former Smoker    Packs/day: 0.50    Years: 6.00    Types: Cigarettes    Quit date: 05/08/1987  . Smokeless tobacco: Never Used  . Alcohol use No  . Drug use: No  . Sexual activity: Not on file   Other Topics Concern  . Not on file   Social History Narrative  . No narrative on file    FAMILY HISTORY: Family History  Problem Relation Age of Onset  . Dementia Mother   . Alcohol abuse Father   . Cancer Sister     lung  . Lymphoma Sister   . Lymphoma Sister     ALLERGIES:  is allergic to celecoxib; codeine; hydrocodone-acetaminophen; nitrofurantoin monohyd macro; penicillins; sulfa antibiotics; and ciprofloxacin.  MEDICATIONS:  Current Outpatient Prescriptions  Medication Sig Dispense Refill  . ACCU-CHEK SMARTVIEW test strip CHECK BLOOD SUGAR TWICE DAILY 200 each 1  . albuterol (PROVENTIL HFA;VENTOLIN HFA) 108 (90 Base) MCG/ACT inhaler Inhale 2 puffs into the lungs every 6 (six) hours as needed for wheezing or shortness of breath. 1 Inhaler 2  . Alcohol Swabs (B-D SINGLE USE SWABS REGULAR) PADS     . Blood Glucose Calibration (ACCU-CHEK SMARTVIEW CONTROL) LIQD     . Blood Glucose Monitoring Suppl (ACCU-CHEK NANO SMARTVIEW) W/DEVICE KIT 1 strip by Does not apply route daily. 1 kit  6  . IRON PO Take 45 mg by mouth.    . Lancets Misc. (ACCU-CHEK FASTCLIX LANCET) KIT Test fasting blood sugar daily. 100 kit 3  . metFORMIN (GLUCOPHAGE) 500 MG tablet TAKE 1 TABLET TWICE DAILY WITH A MEAL 180 tablet 1  . MILK THISTLE PO Take by mouth.    Marland Kitchen omeprazole (PRILOSEC) 20 MG capsule TAKE 1 CAPSULE TWICE DAILY 180 capsule 1  . Potassium 99 MG TABS Take by mouth.    . quinapril (ACCUPRIL) 10 MG tablet TAKE 1 TABLET EVERY DAY 90 tablet 1  . RESTASIS 0.05 % ophthalmic emulsion     . TURMERIC CURCUMIN PO Take by mouth.    . Vitamin D, Ergocalciferol, (DRISDOL)  50000 units CAPS capsule Take 1 capsule (50,000 Units total) by mouth once a week. 12 capsule 1   No current facility-administered medications for this visit.     PHYSICAL EXAMINATION:  Vitals:   06/30/16 1047  BP: 138/72  Pulse: 71  Temp: 97.5 F (36.4 C)   Filed Weights   06/30/16 1047  Weight: 185 lb 8 oz (84.1 kg)    GENERAL: Well-nourished well-developed; Alert, no distress and comfortable.  Alone.  EYES: no pallor or icterus OROPHARYNX: no thrush or ulceration; good dentition  NECK: supple, no masses felt LYMPH:  no palpable lymphadenopathy in the cervical, axillary or inguinal regions LUNGS: clear to auscultation and  No wheeze or crackles HEART/CVS: regular rate & rhythm and no murmurs; No lower extremity edema ABDOMEN: abdomen soft, non-tender and normal bowel sounds; positive for splenomegaly.  Musculoskeletal:no cyanosis of digits and no clubbing  PSYCH: alert & oriented x 3 with fluent speech NEURO: no focal motor/sensory deficits SKIN:  no rashes; About 2 cm raised more on the left hip- not changing.   LABORATORY DATA:  I have reviewed the data as listed Lab Results  Component Value Date   WBC 2.4 (L) 06/30/2016   HGB 11.3 (L) 06/30/2016   HCT 34.0 (L) 06/30/2016   MCV 80.7 06/30/2016   PLT 71 (L) 06/30/2016    Recent Labs  08/25/15 1057 12/29/15 1007 06/30/16 1006  NA 142 135 136  K 5.2 3.8 3.8  CL 98 101 101  CO2 _0 GLUCOSE 133* 139* 148*  BUN _1 CREATININE 0.61 0.61 0.54  CALCIUM 9.7 9.4 9.1  GFRNONAA 89 >60 >60  GFRAA 103 >60 >60  PROT 6.8 6.9 6.9  ALBUMIN 4.2 4.0 3.7  AST 41* 34 47*  ALT _2 ALKPHOS 74 52 75  BILITOT 0.7 1.2 1.0    RADIOGRAPHIC STUDIES: I have personally reviewed the radiological images as listed and agreed with the findings in the report. No results found.  ASSESSMENT & PLAN:  Thrombocytopenia ([place]) # Thrombocytopenia secondary to spleen/cirrhosis. Decreasing, relatively stable  - 70.  Asymptomatic. Last abdominal ultrasound 01/08/14. Consider bone marrow biopsy if platelets decrease further, previously deferred.  # Cirrhosis/no decompensation. Hep B/Hep C negative on 12/29/15.  # Mole on the left hip- not changing. Discussed the potential concerning symptoms- recommend evaluation with dermatology if changing.  # Abd u/s next week; follow up 6 months/labs  Lucendia Herrlich, NP 06/30/2016 12:08 PM

## 2016-06-30 NOTE — Assessment & Plan Note (Addendum)
#  Thrombocytopenia secondary to spleen/cirrhosis. Decreasing, relatively stable  - 70. Asymptomatic. Last abdominal ultrasound 01/08/14. Consider bone marrow biopsy if platelets decrease further, previously deferred.  # Cirrhosis/no decompensation. Hep B/Hep C negative on 12/29/15.  # Mole on the left hip- not changing. Discussed the potential concerning symptoms- recommend evaluation with dermatology if changing.  # Abd u/s next week; follow up 6 months/labs

## 2016-07-03 ENCOUNTER — Other Ambulatory Visit: Payer: Self-pay

## 2016-07-03 DIAGNOSIS — K2100 Gastro-esophageal reflux disease with esophagitis, without bleeding: Secondary | ICD-10-CM

## 2016-07-03 DIAGNOSIS — E119 Type 2 diabetes mellitus without complications: Secondary | ICD-10-CM

## 2016-07-03 DIAGNOSIS — I10 Essential (primary) hypertension: Secondary | ICD-10-CM

## 2016-07-03 MED ORDER — METFORMIN HCL 500 MG PO TABS: ORAL_TABLET | ORAL | 1 refills | Status: DC

## 2016-07-03 MED ORDER — OMEPRAZOLE 20 MG PO CPDR: 20.0000 mg | DELAYED_RELEASE_CAPSULE | Freq: Two times a day (BID) | ORAL | 1 refills | Status: DC

## 2016-07-03 MED ORDER — QUINAPRIL HCL 10 MG PO TABS: 10.0000 mg | ORAL_TABLET | Freq: Every day | ORAL | 1 refills | Status: DC

## 2016-07-03 NOTE — Telephone Encounter (Signed)
Will refill medications, but she is due for follow up appointment to check BP and diabetes.

## 2016-07-05 ENCOUNTER — Telehealth: Payer: Self-pay | Admitting: Internal Medicine

## 2016-07-05 ENCOUNTER — Ambulatory Visit
Admission: RE | Admit: 2016-07-05 | Discharge: 2016-07-05 | Disposition: A | Discharge status: Home / Self Care | Payer: Medicare HMO | Source: Ambulatory Visit | Attending: Oncology | Admitting: Oncology

## 2016-07-05 DIAGNOSIS — D696 Thrombocytopenia, unspecified: Secondary | ICD-10-CM | POA: Diagnosis not present

## 2016-07-05 DIAGNOSIS — R161 Splenomegaly, not elsewhere classified: Secondary | ICD-10-CM | POA: Diagnosis not present

## 2016-07-05 DIAGNOSIS — K7460 Unspecified cirrhosis of liver: Secondary | ICD-10-CM | POA: Diagnosis not present

## 2016-07-05 DIAGNOSIS — K8020 Calculus of gallbladder without cholecystitis without obstruction: Secondary | ICD-10-CM | POA: Insufficient documentation

## 2016-07-05 NOTE — Telephone Encounter (Signed)
Please inform pt that she has gall stones; but we recommend monitoring unless she has worsening abdominal pain/naueas or vomitting- would then refer to surgeon to have the gallbladder taken out. Please document.

## 2016-07-05 NOTE — Telephone Encounter (Signed)
Spoke with pt 1430. Patient informed. She states that she has a known h/o gallstones and is not having any symptoms.

## 2016-07-11 ENCOUNTER — Ambulatory Visit (INDEPENDENT_AMBULATORY_CARE_PROVIDER_SITE_OTHER): Payer: Medicare HMO | Admitting: Family Medicine

## 2016-07-11 ENCOUNTER — Encounter: Payer: Self-pay | Admitting: Family Medicine

## 2016-07-11 VITALS — BP 128/66 | HR 73 | Temp 98.4°F | Wt 185.4 lb

## 2016-07-11 DIAGNOSIS — D696 Thrombocytopenia, unspecified: Secondary | ICD-10-CM

## 2016-07-11 DIAGNOSIS — R5383 Other fatigue: Secondary | ICD-10-CM

## 2016-07-11 DIAGNOSIS — R1011 Right upper quadrant pain: Secondary | ICD-10-CM

## 2016-07-11 NOTE — Patient Instructions (Signed)

## 2016-07-11 NOTE — Progress Notes (Signed)
Patient: Jennifer [name] Female    DOB: 1941/04/26   76 y.o.   MRN: [id number] Visit Date: 07/11/2016  Today's Provider: Vernie Murders, PA   Chief Complaint  Patient presents with  . Follow-up   Subjective:    HPI Patient is here to discuss abdominal ultrasound results from 07/05/2016. Patient states she was advised by oncologist that she has gallstones. She would also like to discuss possible B 12 injections. She is currently not taking any iron supplements.   Past Medical History:  Diagnosis Date  . Allergy   . Arthritis   . Asthma   . Diabetes mellitus without complication ([place])   . Fatty liver   . GERD (gastroesophageal reflux disease)   . Hyperlipidemia   . Hypertension   . Mild nonproliferative diabetic retinopathy of both eyes ([place])   . Motion sickness    boats   Patient Active Problem List   Diagnosis Date Noted  . Upper respiratory infection 03/17/2016  . Thrombocytopenia ([place]) 12/29/2015  . Primary osteoarthritis of left knee 09/30/2015  . Special screening for malignant neoplasms, colon   . Benign neoplasm of ascending colon   . Benign neoplasm of transverse colon   . Benign neoplasm of sigmoid colon   . Arthritis 10/21/2014  . Allergic rhinitis 10/14/2014  . Airway hyperreactivity 10/14/2014  . Back ache 10/14/2014  . Bell palsy 10/14/2014  . Controlled diabetes mellitus type II without complication ([place]) 29/47/6546  . Elevation of level of transaminase or lactic acid dehydrogenase (LDH) 10/14/2014  . Calculus of gallbladder 10/14/2014  . HLD (hyperlipidemia) 10/14/2014  . BP (high blood pressure) 10/14/2014  . Gonalgia 10/14/2014  . Benign neoplasm of soft tissues 10/14/2014  . NASH (nonalcoholic steatohepatitis) 10/14/2014  . Adiposity 10/14/2014  . Esophagitis, reflux 10/14/2014  . Enlargement of spleen 10/14/2014  . Change in blood platelet count 10/14/2014  . Avitaminosis D 10/14/2014   Past Surgical History:  Procedure Laterality Date  .  ABDOMINAL HYSTERECTOMY  1978  . COLONOSCOPY WITH PROPOFOL N/A 02/01/2015   Procedure: COLONOSCOPY WITH PROPOFOL;  Surgeon: Lucilla Lame, MD;  Location: [facility];  Service: Endoscopy;  Laterality: N/A;  Diabetic - oral meds  . POLYPECTOMY  02/01/2015   Procedure: POLYPECTOMY;  Surgeon: Lucilla Lame, MD;  Location: [facility];  Service: Endoscopy;;  . TONSILLECTOMY AND ADENOIDECTOMY     Family History  Problem Relation Age of Onset  . Dementia Mother   . Alcohol abuse Father   . Cancer Sister     lung  . Lymphoma Sister   . Lymphoma Sister    Allergies  Allergen Reactions  . Celecoxib Other (See Comments)    Hearing loss while taking  . Codeine Other (See Comments)    Pt denies  . Hydrocodone-Acetaminophen Other (See Comments)    Pt denies  . Nitrofurantoin Monohyd Macro     "Sugar got high"  . Penicillins   . Sulfa Antibiotics   . Ciprofloxacin Rash     Previous Medications   ACCU-CHEK SMARTVIEW TEST STRIP    CHECK BLOOD SUGAR TWICE DAILY   ALBUTEROL (PROVENTIL HFA;VENTOLIN HFA) 108 (90 BASE) MCG/ACT INHALER    Inhale 2 puffs into the lungs every 6 (six) hours as needed for wheezing or shortness of breath.   ALCOHOL SWABS (B-D SINGLE USE SWABS REGULAR) PADS       BLOOD GLUCOSE CALIBRATION (ACCU-CHEK SMARTVIEW CONTROL) LIQD       BLOOD GLUCOSE MONITORING SUPPL (ACCU-CHEK NANO SMARTVIEW)  W/DEVICE KIT    1 strip by Does not apply route daily.   IRON PO    Take 45 mg by mouth.   LANCETS MISC. (ACCU-CHEK FASTCLIX LANCET) KIT    Test fasting blood sugar daily.   METFORMIN (GLUCOPHAGE) 500 MG TABLET    TAKE 1 TABLET TWICE DAILY WITH A MEAL   MILK THISTLE PO    Take by mouth.   OMEPRAZOLE (PRILOSEC) 20 MG CAPSULE    Take 1 capsule (20 mg total) by mouth 2 (two) times daily.   POTASSIUM 99 MG TABS    Take by mouth.   QUINAPRIL (ACCUPRIL) 10 MG TABLET    Take 1 tablet (10 mg total) by mouth daily.   RESTASIS 0.05 % OPHTHALMIC EMULSION       TURMERIC CURCUMIN PO     Take by mouth.   VITAMIN D, ERGOCALCIFEROL, (DRISDOL) 50000 UNITS CAPS CAPSULE    Take 1 capsule (50,000 Units total) by mouth once a week.    Review of Systems  Constitutional: Negative.   Respiratory: Negative.   Cardiovascular: Negative.     Social History  Substance Use Topics  . Smoking status: Former Smoker    Packs/day: 0.50    Years: 6.00    Types: Cigarettes    Quit date: 05/08/1987  . Smokeless tobacco: Never Used  . Alcohol use No   Objective:   BP 128/66 (BP Location: Right Arm, Patient Position: Sitting, Cuff Size: Normal)   Pulse 73   Temp 98.4 F (36.9 C) (Oral)   Wt 185 lb 6.4 oz (84.1 kg)   SpO2 97%   BMI 35.03 kg/m   Physical Exam  Constitutional: She is oriented to person, place, and time. She appears well-developed and well-nourished. No distress.  HENT:  Head: Normocephalic and atraumatic.  Right Ear: Hearing normal.  Left Ear: Hearing normal.  Nose: Nose normal.  Eyes: Conjunctivae and lids are normal. Right eye exhibits no discharge. Left eye exhibits no discharge. No scleral icterus.  Cardiovascular: Normal rate and regular rhythm.   Pulmonary/Chest: Effort normal and breath sounds normal. No respiratory distress.  Abdominal: Soft. Bowel sounds are normal. There is tenderness.  RUQ tenderness to palpation.  Neurological: She is alert and oriented to person, place, and time.  Skin: Skin is intact. No lesion and no rash noted.  Psychiatric: She has a normal mood and affect. Her speech is normal and behavior is normal. Thought content normal.      Assessment & Plan:     1. RUQ abdominal pain Ultrasound of abdomen by hematologist (Dr. Rogue Bussing) showed multiple stone in gallbladder still present and positive Murphy's sign. Still tender today with increase in discomfort with fatty/fried foods and eructations. Recommend evaluation by surgeon. With significant thrombocytopenia, may not be a candidate for cholecystectomy. - CBC with  Differential/Platelet - Ambulatory referral to General Surgery  2. Thrombocytopenia ([place]) Followed by Dr. Rogue Bussing with platelets down to 71,000 and improvement in hemoglobin and hematocrit for comparison to labs on 03-17-16. Continue follow up with hematologist as planned. Wants to postpone any bone marrow tests. - CBC with Differential/Platelet  3. Other fatigue No longer taking iron supplement. Still feeling tired and some hepatocellular disease. States hematologist recommended a B12 injection but did not give it to her. CBC indices don't show any macrocytosis but RDW elevated and hgb/hct still slight low. Will check ferritin and B12 level. - Ferritin - B12

## 2016-07-12 LAB — CBC WITH DIFFERENTIAL/PLATELET
Basophils Absolute: 0 10*3/uL (ref 0.0–0.2)
Basos: 0 %
EOS (ABSOLUTE): 0 10*3/uL (ref 0.0–0.4)
EOS: 1 %
HEMOGLOBIN: 11.7 g/dL (ref 11.1–15.9)
Hematocrit: 36.4 % (ref 34.0–46.6)
IMMATURE GRANS (ABS): 0 10*3/uL (ref 0.0–0.1)
Immature Granulocytes: 0 %
LYMPHS ABS: 1.1 10*3/uL (ref 0.7–3.1)
Lymphs: 32 %
MCH: 26.7 pg (ref 26.6–33.0)
MCHC: 32.1 g/dL (ref 31.5–35.7)
MCV: 83 fL (ref 79–97)
Monocytes Absolute: 0.3 10*3/uL (ref 0.1–0.9)
Monocytes: 9 %
NEUTROS ABS: 1.9 10*3/uL (ref 1.4–7.0)
Neutrophils: 58 %
Platelets: 74 10*3/uL — CL (ref 150–379)
RBC: 4.39 x10E6/uL (ref 3.77–5.28)
RDW: 16.9 % — ABNORMAL HIGH (ref 12.3–15.4)
WBC: 3.3 10*3/uL — ABNORMAL LOW (ref 3.4–10.8)

## 2016-07-12 LAB — FERRITIN: FERRITIN: 22 ng/mL (ref 15–150)

## 2016-07-12 LAB — VITAMIN B12: Vitamin B-12: 713 pg/mL (ref 232–1245)

## 2016-07-13 ENCOUNTER — Other Ambulatory Visit: Payer: Self-pay | Admitting: Family Medicine

## 2016-07-13 ENCOUNTER — Telehealth: Payer: Self-pay | Admitting: Family Medicine

## 2016-07-13 NOTE — Telephone Encounter (Signed)
Please c/b pt with results from labs. Thnx, kb

## 2016-07-13 NOTE — Telephone Encounter (Signed)
Advised patient of results.  

## 2016-07-19 ENCOUNTER — Encounter: Payer: Self-pay | Admitting: General Surgery

## 2016-07-19 ENCOUNTER — Ambulatory Visit (INDEPENDENT_AMBULATORY_CARE_PROVIDER_SITE_OTHER): Payer: Medicare HMO | Admitting: General Surgery

## 2016-07-19 VITALS — BP 132/72 | HR 82 | Resp 16 | Ht 61.0 in | Wt 185.0 lb

## 2016-07-19 DIAGNOSIS — K8020 Calculus of gallbladder without cholecystitis without obstruction: Secondary | ICD-10-CM

## 2016-07-19 LAB — SPECIMEN STATUS REPORT

## 2016-07-19 LAB — METHYLMALONIC ACID, SERUM: Methylmalonic Acid: 112 nmol/L (ref 0–378)

## 2016-07-19 NOTE — Patient Instructions (Signed)
The patient is aware to call back for any questions or concerns.  

## 2016-07-19 NOTE — Progress Notes (Signed)
Patient ID: Jennifer [name], female   DOB: 1941/04/21, 76 y.o.   MRN: [id number]  Chief Complaint  Patient presents with  . Other    HPI Jennifer [name] is a 76 y.o. female. Here today for evaluation of abdominal pain referred by Vernie Murders PA. She has low platelets and is followed at the [facility] She is on slow iron. She has an enlarged spleen and non alcoholic fatty liver.  The patient has been totally asymptomatic in regards to abdominal pain until her recent follow-up ultrasound to assess her liver. She reports that she experienced discomfort while the ultrasound technologist was examining her, and at evening she ate fried fish and had pain. Prior to this she had had no postprandial abdominal discomfort. Since that event she is been symptom-free. She had an abdominal ultrasound 07-05-16, showing gallstones. Blood glucose this morning 139. Husband was Joani Cosma that passed in 2014. He was seen here for repair of an umbilical hernia in 6283.  The patient has been followed for thrombocytopenia for at least several years. This is attributed to cirrhosis and hypersplenism. Cirrhosis is based on nonalcoholic events.    HPI  Past Medical History:  Diagnosis Date  . Allergy   . Arthritis   . Asthma   . Colon polyp   . Diabetes mellitus without complication ([facility]) 6629  . Fatty liver   . GERD (gastroesophageal reflux disease)   . Hyperlipidemia   . Hypertension   . Mild nonproliferative diabetic retinopathy of both eyes ([place])   . Motion sickness    boats    Past Surgical History:  Procedure Laterality Date  . ABDOMINAL HYSTERECTOMY  1978  . COLONOSCOPY WITH PROPOFOL N/A 02/01/2015   Procedure: COLONOSCOPY WITH PROPOFOL;  Surgeon: Lucilla Lame, MD;  Location: [facility];  Service: Endoscopy;  Laterality: N/A;  Diabetic - oral meds  . POLYPECTOMY  02/01/2015   Procedure: POLYPECTOMY;  Surgeon: Lucilla Lame, MD;  Location: [facility];  Service: Endoscopy;;   . TONSILLECTOMY AND ADENOIDECTOMY      Family History  Problem Relation Age of Onset  . Dementia Mother   . Alcohol abuse Father   . Cancer Sister     lung  . Lymphoma Sister   . Lymphoma Sister     Social History Social History  Substance Use Topics  . Smoking status: Former Smoker    Packs/day: 0.50    Years: 6.00    Types: Cigarettes    Quit date: 05/08/1987  . Smokeless tobacco: Never Used  . Alcohol use No    Allergies  Allergen Reactions  . Celecoxib Other (See Comments)    Hearing loss while taking  . Codeine Other (See Comments)    Pt denies  . Hydrocodone-Acetaminophen Other (See Comments)    Pt denies  . Nitrofurantoin Monohyd Macro     "Sugar got high"  . Penicillins   . Sulfa Antibiotics   . Ciprofloxacin Rash    Current Outpatient Prescriptions  Medication Sig Dispense Refill  . ACCU-CHEK SMARTVIEW test strip CHECK BLOOD SUGAR TWICE DAILY 200 each 1  . albuterol (PROVENTIL HFA;VENTOLIN HFA) 108 (90 Base) MCG/ACT inhaler Inhale 2 puffs into the lungs every 6 (six) hours as needed for wheezing or shortness of breath. 1 Inhaler 2  . Alcohol Swabs (B-D SINGLE USE SWABS REGULAR) PADS     . Blood Glucose Calibration (ACCU-CHEK SMARTVIEW CONTROL) LIQD     . Blood Glucose Monitoring Suppl (ACCU-CHEK NANO SMARTVIEW) W/DEVICE  KIT 1 strip by Does not apply route daily. 1 kit 6  . IRON PO Take 45 mg by mouth.    . Lancets Misc. (ACCU-CHEK FASTCLIX LANCET) KIT Test fasting blood sugar daily. 100 kit 3  . metFORMIN (GLUCOPHAGE) 500 MG tablet TAKE 1 TABLET TWICE DAILY WITH A MEAL 180 tablet 1  . MILK THISTLE PO Take by mouth.    Marland Kitchen omeprazole (PRILOSEC) 20 MG capsule Take 1 capsule (20 mg total) by mouth 2 (two) times daily. 180 capsule 1  . Potassium 99 MG TABS Take by mouth.    . quinapril (ACCUPRIL) 10 MG tablet Take 1 tablet (10 mg total) by mouth daily. 90 tablet 1  . RESTASIS 0.05 % ophthalmic emulsion     . TURMERIC CURCUMIN PO Take by mouth.    . Vitamin  D, Ergocalciferol, (DRISDOL) 50000 units CAPS capsule Take 1 capsule (50,000 Units total) by mouth once a week. 12 capsule 1   No current facility-administered medications for this visit.     Review of Systems Review of Systems  Constitutional: Negative.   Respiratory: Negative.   Cardiovascular: Negative.   Gastrointestinal: Positive for abdominal pain and nausea. Negative for constipation, diarrhea and vomiting.    Blood pressure 132/72, pulse 82, resp. rate 16, height 5' 1"  (1.549 m), weight 185 lb (83.9 kg).  Physical Exam Physical Exam  Constitutional: She is oriented to person, place, and time. She appears well-developed and well-nourished.  Eyes: Conjunctivae are normal. No scleral icterus.  Neck: Neck supple.  Cardiovascular: Normal rate, regular rhythm and normal heart sounds.   Pulmonary/Chest: Effort normal and breath sounds normal.  Abdominal: Soft. Normal appearance and bowel sounds are normal. There is no hepatomegaly. There is no tenderness.  Lymphadenopathy:    She has no cervical adenopathy.    She has no axillary adenopathy.  Neurological: She is alert and oriented to person, place, and time.  Skin: Skin is warm and dry.  Psychiatric: Her behavior is normal.    Data Reviewed Ultrasound dated 07/05/2016 completed for follow-up of hepatic cirrhosis and splenomegaly showed small echogenic foci in the gallbladder neck with cholelithiasis. Mild gallbladder wall thickening to 4 mm. No pericholecystic fluid. Positive sonographic Murphy sign. Common bile duct 5 mm. Liver contour unchanged. Persistent splenomegaly   Ultrasound examination of 01/08/2014 showed multiple gallstones, the largest measuring 16 mm. No gallbladder wall thickening. Normal common bile duct. Hepatic cirrhosis as identified on prior examination. Patent portal vein. Like enlargement. No interval change from 2013 exam.  CT scan of the abdomen and pelvis for renal stone dated 02/25/2011 showed  calcified gallstones. Nodular contour to the liver suggestive of cirrhosis.  CBC dated 07/11/2016 showed a white blood cell count 3300 with normal differential. Unchanged over the last 4 months. Lately count of 74,000, also unchanged. Hemoglobin 11.7. MCV 83.  06/30/2016 comprehensive metabolic panel was notable for mild elevation of the serum SGOT at 47, liver function studies otherwise normal. Unchanged total bilirubin. Normal electrolytes. Modest elevation of the serum glucose.  Assessment    Long-standing cholelithiasis based on identification of calcified stones in 2012. Questionably symptomatic.  Thrombocytopenia secondary to splenomegaly. No clinical bleeding.    Plan    The patient questioned whether she could have her spleen removed to improve her platelet count. This was discouraged. At this time, I don't recommend elective cholecystectomy as I believe she is essentially asymptomatic except for the discomfort the night of her recent ultrasound were she experienced discomfort during the  exam itself. Careful questioning does not show any clear dietary component to her intermittent abdominal discomfort.    Patient to return as needed. The patient is aware to call back for any questions or concerns.  This information has been scribed by Gaspar Cola CMA.  Robert Bellow 07/20/2016, 7:28 AM

## 2016-07-20 ENCOUNTER — Telehealth: Payer: Self-pay

## 2016-07-20 NOTE — Telephone Encounter (Signed)
Advised pt of lab results. Pt verbally acknowledges understanding. Chen Saadeh Drozdowski, CMA   

## 2016-07-20 NOTE — Telephone Encounter (Signed)
-----   Message from [place], [place] sent at 07/20/2016 10:07 AM EDT ----- Methylmalonic acid level is normal. No sign of B12 deficiency with blood tests normal. May take a B-complex vitamin daily to keep this from dropping. Discuss further at CPE on 07-27-16.

## 2016-07-21 ENCOUNTER — Ambulatory Visit: Payer: Medicare HMO | Admitting: Family Medicine

## 2016-07-21 ENCOUNTER — Ambulatory Visit: Payer: Medicare HMO

## 2016-07-27 ENCOUNTER — Encounter: Payer: Self-pay | Admitting: Family Medicine

## 2016-07-27 ENCOUNTER — Ambulatory Visit (INDEPENDENT_AMBULATORY_CARE_PROVIDER_SITE_OTHER): Payer: Medicare HMO

## 2016-07-27 ENCOUNTER — Ambulatory Visit (INDEPENDENT_AMBULATORY_CARE_PROVIDER_SITE_OTHER): Payer: Medicare HMO | Admitting: Family Medicine

## 2016-07-27 VITALS — BP 132/72 | HR 76 | Temp 98.2°F | Ht 61.0 in | Wt 185.0 lb

## 2016-07-27 DIAGNOSIS — M1712 Unilateral primary osteoarthritis, left knee: Secondary | ICD-10-CM

## 2016-07-27 DIAGNOSIS — K2100 Gastro-esophageal reflux disease with esophagitis, without bleeding: Secondary | ICD-10-CM

## 2016-07-27 DIAGNOSIS — E119 Type 2 diabetes mellitus without complications: Secondary | ICD-10-CM

## 2016-07-27 DIAGNOSIS — E7800 Pure hypercholesterolemia, unspecified: Secondary | ICD-10-CM

## 2016-07-27 DIAGNOSIS — Z0000 Encounter for general adult medical examination without abnormal findings: Secondary | ICD-10-CM

## 2016-07-27 DIAGNOSIS — I10 Essential (primary) hypertension: Secondary | ICD-10-CM

## 2016-07-27 LAB — POCT GLYCOSYLATED HEMOGLOBIN (HGB A1C)
Est. average glucose Bld gHb Est-mCnc: 157
Hemoglobin A1C: 7.1

## 2016-07-27 LAB — POCT UA - MICROALBUMIN: Microalbumin Ur, POC: 20 mg/L

## 2016-07-27 NOTE — Patient Instructions (Signed)
Preventive Care for Adults  A healthy lifestyle and preventive care can promote health and wellness. Preventive health guidelines for adults include the following key practices.  . A routine yearly physical is a good way to check with your health care provider about your health and preventive screening. It is a chance to share any concerns and updates on your health and to receive a thorough exam.  . Visit your dentist for a routine exam and preventive care every 6 months. Brush your teeth twice a day and floss once a day. Good oral hygiene prevents tooth decay and gum disease.  . The frequency of eye exams is based on your age, health, family medical history, use  of contact lenses, and other factors. Follow your health care provider's ecommendations for frequency of eye exams.  . Eat a healthy diet. Foods like vegetables, fruits, whole grains, low-fat dairy products, and lean protein foods contain the nutrients you need without too many calories. Decrease your intake of foods high in solid fats, added sugars, and salt. Eat the right amount of calories for you. Get information about a proper diet from your health care provider, if necessary.  . Regular physical exercise is one of the most important things you can do for your health. Most adults should get at least 150 minutes of moderate-intensity exercise (any activity that increases your heart rate and causes you to sweat) each week. In addition, most adults need muscle-strengthening exercises on 2 or more days a week.  Silver Sneakers may be a benefit available to you. To determine eligibility, you may visit the website: www.silversneakers.com or contact program at 712-555-7504 Mon-Fri between 8AM-8PM.   . Maintain a healthy weight. The body mass index (BMI) is a screening tool to identify possible weight problems. It provides an estimate of body fat based on height and weight. Your health care provider can find your BMI and can help you  achieve or maintain a healthy weight.   For adults 20 years and older: ? A BMI below 18.5 is considered underweight. ? A BMI of 18.5 to 24.9 is normal. ? A BMI of 25 to 29.9 is considered overweight. ? A BMI of 30 and above is considered obese.   . Maintain normal blood lipids and cholesterol levels by exercising and minimizing your intake of saturated fat. Eat a balanced diet with plenty of fruit and vegetables. Blood tests for lipids and cholesterol should begin at age 70 and be repeated every 5 years. If your lipid or cholesterol levels are high, you are over 50, or you are at high risk for heart disease, you may need your cholesterol levels checked more frequently. Ongoing high lipid and cholesterol levels should be treated with medicines if diet and exercise are not working.  . If you smoke, find out from your health care provider how to quit. If you do not use tobacco, please do not start.  . If you choose to drink alcohol, please do not consume more than 2 drinks per day. One drink is considered to be 12 ounces (355 mL) of beer, 5 ounces (148 mL) of wine, or 1.5 ounces (44 mL) of liquor.  . If you are 92-62 years old, ask your health care provider if you should take aspirin to prevent strokes.  . Use sunscreen. Apply sunscreen liberally and repeatedly throughout the day. You should seek shade when your shadow is shorter than you. Protect yourself by wearing long sleeves, pants, a wide-brimmed hat, and sunglasses year  round, whenever you are outdoors.  . Once a month, do a whole body skin exam, using a mirror to look at the skin on your back. Tell your health care provider of new moles, moles that have irregular borders, moles that are larger than a pencil eraser, or moles that have changed in shape or color.

## 2016-07-27 NOTE — Patient Instructions (Signed)
Health Maintenance, Female Adopting a healthy lifestyle and getting preventive care can go a long way to promote health and wellness. Talk with your health care provider about what schedule of regular examinations is right for you. This is a good chance for you to check in with your provider about disease prevention and staying healthy. In between checkups, there are plenty of things you can do on your own. Experts have done a lot of research about which lifestyle changes and preventive measures are most likely to keep you healthy. Ask your health care provider for more information. Weight and diet Eat a healthy diet  Be sure to include plenty of vegetables, fruits, low-fat dairy products, and lean protein.  Do not eat a lot of foods high in solid fats, added sugars, or salt.  Get regular exercise. This is one of the most important things you can do for your health.  Most adults should exercise for at least 150 minutes each week. The exercise should increase your heart rate and make you sweat (moderate-intensity exercise).  Most adults should also do strengthening exercises at least twice a week. This is in addition to the moderate-intensity exercise. Maintain a healthy weight  Body mass index (BMI) is a measurement that can be used to identify possible weight problems. It estimates body fat based on height and weight. Your health care provider can help determine your BMI and help you achieve or maintain a healthy weight.  For females 76 years of age and older:  A BMI below 18.5 is considered underweight.  A BMI of 18.5 to 24.9 is normal.  A BMI of 25 to 29.9 is considered overweight.  A BMI of 30 and above is considered obese. Watch levels of cholesterol and blood lipids  You should start having your blood tested for lipids and cholesterol at 77 years of age, then have this test every 5 years.  You may need to have your cholesterol levels checked more often if:  Your lipid or  cholesterol levels are high.  You are older than 77 years of age.  You are at high risk for heart disease. Cancer screening Lung Cancer  Lung cancer screening is recommended for adults 64-42 years old who are at high risk for lung cancer because of a history of smoking.  A yearly low-dose CT scan of the lungs is recommended for people who:  Currently smoke.  Have quit within the past 15 years.  Have at least a 30-pack-year history of smoking. A pack year is smoking an average of one pack of cigarettes a day for 1 year.  Yearly screening should continue until it has been 15 years since you quit.  Yearly screening should stop if you develop a health problem that would prevent you from having lung cancer treatment. Breast Cancer  Practice breast self-awareness. This means understanding how your breasts normally appear and feel.  It also means doing regular breast self-exams. Let your health care provider know about any changes, no matter how small.  If you are in your 20s or 30s, you should have a clinical breast exam (CBE) by a health care provider every 1-3 years as part of a regular health exam.  If you are 34 or older, have a CBE every year. Also consider having a breast X-ray (mammogram) every year.  If you have a family history of breast cancer, talk to your health care provider about genetic screening.  If you are at high risk for breast cancer, talk  to your health care provider about having an MRI and a mammogram every year.  Breast cancer gene (BRCA) assessment is recommended for women who have family members with BRCA-related cancers. BRCA-related cancers include:  Breast.  Ovarian.  Tubal.  Peritoneal cancers.  Results of the assessment will determine the need for genetic counseling and BRCA1 and BRCA2 testing. Cervical Cancer  Your health care provider may recommend that you be screened regularly for cancer of the pelvic organs (ovaries, uterus, and vagina).  This screening involves a pelvic examination, including checking for microscopic changes to the surface of your cervix (Pap test). You may be encouraged to have this screening done every 3 years, beginning at age 24.  For women ages 66-65, health care providers may recommend pelvic exams and Pap testing every 3 years, or they may recommend the Pap and pelvic exam, combined with testing for human papilloma virus (HPV), every 5 years. Some types of HPV increase your risk of cervical cancer. Testing for HPV may also be done on women of any age with unclear Pap test results.  Other health care providers may not recommend any screening for nonpregnant women who are considered low risk for pelvic cancer and who do not have symptoms. Ask your health care provider if a screening pelvic exam is right for you.  If you have had past treatment for cervical cancer or a condition that could lead to cancer, you need Pap tests and screening for cancer for at least 20 years after your treatment. If Pap tests have been discontinued, your risk factors (such as having a new sexual partner) need to be reassessed to determine if screening should resume. Some women have medical problems that increase the chance of getting cervical cancer. In these cases, your health care provider may recommend more frequent screening and Pap tests. Colorectal Cancer  This type of cancer can be detected and often prevented.  Routine colorectal cancer screening usually begins at 77 years of age and continues through 77 years of age.  Your health care provider may recommend screening at an earlier age if you have risk factors for colon cancer.  Your health care provider may also recommend using home test kits to check for hidden blood in the stool.  A small camera at the end of a tube can be used to examine your colon directly (sigmoidoscopy or colonoscopy). This is done to check for the earliest forms of colorectal cancer.  Routine  screening usually begins at age 41.  Direct examination of the colon should be repeated every 5-10 years through 77 years of age. However, you may need to be screened more often if early forms of precancerous polyps or small growths are found. Skin Cancer  Check your skin from head to toe regularly.  Tell your health care provider about any new moles or changes in moles, especially if there is a change in a mole's shape or color.  Also tell your health care provider if you have a mole that is larger than the size of a pencil eraser.  Always use sunscreen. Apply sunscreen liberally and repeatedly throughout the day.  Protect yourself by wearing long sleeves, pants, a wide-brimmed hat, and sunglasses whenever you are outside. Heart disease, diabetes, and high blood pressure  High blood pressure causes heart disease and increases the risk of stroke. High blood pressure is more likely to develop in:  People who have blood pressure in the high end of the normal range (130-139/85-89 mm Hg).  People who are overweight or obese.  People who are African American.  If you are 59-24 years of age, have your blood pressure checked every 3-5 years. If you are 34 years of age or older, have your blood pressure checked every year. You should have your blood pressure measured twice-once when you are at a hospital or clinic, and once when you are not at a hospital or clinic. Record the average of the two measurements. To check your blood pressure when you are not at a hospital or clinic, you can use:  An automated blood pressure machine at a pharmacy.  A home blood pressure monitor.  If you are between 29 years and 60 years old, ask your health care provider if you should take aspirin to prevent strokes.  Have regular diabetes screenings. This involves taking a blood sample to check your fasting blood sugar level.  If you are at a normal weight and have a low risk for diabetes, have this test once  every three years after 77 years of age.  If you are overweight and have a high risk for diabetes, consider being tested at a younger age or more often. Preventing infection Hepatitis B  If you have a higher risk for hepatitis B, you should be screened for this virus. You are considered at high risk for hepatitis B if:  You were born in a country where hepatitis B is common. Ask your health care provider which countries are considered high risk.  Your parents were born in a high-risk country, and you have not been immunized against hepatitis B (hepatitis B vaccine).  You have HIV or AIDS.  You use needles to inject street drugs.  You live with someone who has hepatitis B.  You have had sex with someone who has hepatitis B.  You get hemodialysis treatment.  You take certain medicines for conditions, including cancer, organ transplantation, and autoimmune conditions. Hepatitis C  Blood testing is recommended for:  Everyone born from [birth year] through 1966.  Anyone with known risk factors for hepatitis C. Sexually transmitted infections (STIs)  You should be screened for sexually transmitted infections (STIs) including gonorrhea and chlamydia if:  You are sexually active and are younger than 77 years of age.  You are older than 77 years of age and your health care provider tells you that you are at risk for this type of infection.  Your sexual activity has changed since you were last screened and you are at an increased risk for chlamydia or gonorrhea. Ask your health care provider if you are at risk.  If you do not have HIV, but are at risk, it may be recommended that you take a prescription medicine daily to prevent HIV infection. This is called pre-exposure prophylaxis (PrEP). You are considered at risk if:  You are sexually active and do not regularly use condoms or know the HIV status of your partner(s).  You take drugs by injection.  You are sexually active with a partner  who has HIV. Talk with your health care provider about whether you are at high risk of being infected with HIV. If you choose to begin PrEP, you should first be tested for HIV. You should then be tested every 3 months for as long as you are taking PrEP. Pregnancy  If you are premenopausal and you may become pregnant, ask your health care provider about preconception counseling.  If you may become pregnant, take 400 to 800 micrograms (mcg) of folic acid  every day.  If you want to prevent pregnancy, talk to your health care provider about birth control (contraception). Osteoporosis and menopause  Osteoporosis is a disease in which the bones lose minerals and strength with aging. This can result in serious bone fractures. Your risk for osteoporosis can be identified using a bone density scan.  If you are 4 years of age or older, or if you are at risk for osteoporosis and fractures, ask your health care provider if you should be screened.  Ask your health care provider whether you should take a calcium or vitamin D supplement to lower your risk for osteoporosis.  Menopause may have certain physical symptoms and risks.  Hormone replacement therapy may reduce some of these symptoms and risks. Talk to your health care provider about whether hormone replacement therapy is right for you. Follow these instructions at home:  Schedule regular health, dental, and eye exams.  Stay current with your immunizations.  Do not use any tobacco products including cigarettes, chewing tobacco, or electronic cigarettes.  If you are pregnant, do not drink alcohol.  If you are breastfeeding, limit how much and how often you drink alcohol.  Limit alcohol intake to no more than 1 drink per day for nonpregnant women. One drink equals 12 ounces of beer, 5 ounces of wine, or 1 ounces of hard liquor.  Do not use street drugs.  Do not share needles.  Ask your health care provider for help if you need support  or information about quitting drugs.  Tell your health care provider if you often feel depressed.  Tell your health care provider if you have ever been abused or do not feel safe at home. This information is not intended to replace advice given to you by your health care provider. Make sure you discuss any questions you have with your health care provider. Document Released: 11/07/2010 Document Revised: 09/30/2015 Document Reviewed: 01/26/2015 Elsevier Interactive Patient Education  2017 [employer].

## 2016-07-27 NOTE — Progress Notes (Signed)
Patient: Jennifer [name], Female    DOB: 03-28-1941, 76 y.o.   MRN: [id number] Visit Date: 07/27/2016  Today's Provider: Vernie Murders, PA   Chief Complaint  Patient presents with  . Annual Exam   Subjective:    Annual physical exam Jennifer [name] is a 76 y.o. female who presents today for health maintenance and complete physical. She feels well. She reports never exercising. She reports she is sleeping fairly well.  -----------------------------------------------------------------   Review of Systems  Constitutional: Negative for chills, fatigue and fever.  HENT: Negative for congestion, ear pain, rhinorrhea, sneezing and sore throat.   Eyes: Negative.  Negative for pain and redness.  Respiratory: Negative for cough, shortness of breath and wheezing.   Cardiovascular: Negative for chest pain and leg swelling.  Gastrointestinal: Negative for abdominal pain, blood in stool, constipation, diarrhea and nausea.  Endocrine: Negative for polydipsia and polyphagia.  Genitourinary: Negative.  Negative for dysuria, flank pain, hematuria, pelvic pain, vaginal bleeding and vaginal discharge.  Musculoskeletal: Negative for arthralgias, back pain, gait problem and joint swelling.  Skin: Negative for rash.  Neurological: Negative.  Negative for dizziness, tremors, seizures, weakness, light-headedness, numbness and headaches.  Hematological: Negative for adenopathy.  Psychiatric/Behavioral: Negative.  Negative for behavioral problems, confusion and dysphoric mood. The patient is not nervous/anxious and is not hyperactive.     Social History      She  reports that she quit smoking about 29 years ago. Her smoking use included Cigarettes. She has a 3.00 pack-year smoking history. She has never used smokeless tobacco. She reports that she does not drink alcohol or use drugs.       Social History   Social History  . Marital status: Widowed    Spouse name: N/A  . Number of children:  N/A  . Years of education: N/A   Social History Main Topics  . Smoking status: Former Smoker    Packs/day: 0.50    Years: 6.00    Types: Cigarettes    Quit date: 05/08/1987  . Smokeless tobacco: Never Used  . Alcohol use No  . Drug use: No  . Sexual activity: Not on file   Other Topics Concern  . Not on file   Social History Narrative  . No narrative on file    Past Medical History:  Diagnosis Date  . Allergy   . Arthritis   . Asthma   . Colon polyp   . Diabetes mellitus without complication ([place]) 1660  . Fatty liver   . GERD (gastroesophageal reflux disease)   . Hyperlipidemia   . Hypertension   . Mild nonproliferative diabetic retinopathy of both eyes ([place])   . Motion sickness    boats     Patient Active Problem List   Diagnosis Date Noted  . Upper respiratory infection 03/17/2016  . Thrombocytopenia ([place]) 12/29/2015  . Primary osteoarthritis of left knee 09/30/2015  . Special screening for malignant neoplasms, colon   . Benign neoplasm of ascending colon   . Benign neoplasm of transverse colon   . Benign neoplasm of sigmoid colon   . Arthritis 10/21/2014  . Allergic rhinitis 10/14/2014  . Airway hyperreactivity 10/14/2014  . Back ache 10/14/2014  . Bell palsy 10/14/2014  . Controlled diabetes mellitus type II without complication ([place]) 60/08/5995  . Elevation of level of transaminase or lactic acid dehydrogenase (LDH) 10/14/2014  . Calculus of gallbladder 10/14/2014  . HLD (hyperlipidemia) 10/14/2014  . BP (high blood pressure)  10/14/2014  . Gonalgia 10/14/2014  . Benign neoplasm of soft tissues 10/14/2014  . NASH (nonalcoholic steatohepatitis) 10/14/2014  . Adiposity 10/14/2014  . Esophagitis, reflux 10/14/2014  . Enlargement of spleen 10/14/2014  . Change in blood platelet count 10/14/2014  . Avitaminosis D 10/14/2014    Past Surgical History:  Procedure Laterality Date  . ABDOMINAL HYSTERECTOMY  1978  . COLONOSCOPY WITH PROPOFOL N/A  02/01/2015   Procedure: COLONOSCOPY WITH PROPOFOL;  Surgeon: Lucilla Lame, MD;  Location: [facility];  Service: Endoscopy;  Laterality: N/A;  Diabetic - oral meds  . POLYPECTOMY  02/01/2015   Procedure: POLYPECTOMY;  Surgeon: Lucilla Lame, MD;  Location: [facility];  Service: Endoscopy;;  . TONSILLECTOMY AND ADENOIDECTOMY      Family History        Family Status  Relation Status  . Mother Deceased at age 71   in her sleep  . Father Deceased at age 63   was beaten  . Sister Alive  . Sister Alive  . Brother Deceased at age 25   hx MI  . Brother Deceased at age 47   melanoma  . Brother Deceased at age 42   cancer        Her family history includes Alcohol abuse in her father; Cancer in her sister; Dementia in her mother; Lymphoma in her sister and sister.     Allergies  Allergen Reactions  . Celecoxib Other (See Comments)    Hearing loss while taking  . Codeine Other (See Comments)    Pt denies  . Hydrocodone-Acetaminophen Other (See Comments)    Pt denies  . Nitrofurantoin Monohyd Macro     "Sugar got high"  . Penicillins   . Sulfa Antibiotics   . Ciprofloxacin Rash     Current Outpatient Prescriptions:  .  ACCU-CHEK SMARTVIEW test strip, CHECK BLOOD SUGAR TWICE DAILY, Disp: 200 each, Rfl: 1 .  albuterol (PROVENTIL HFA;VENTOLIN HFA) 108 (90 Base) MCG/ACT inhaler, Inhale 2 puffs into the lungs every 6 (six) hours as needed for wheezing or shortness of breath., Disp: 1 Inhaler, Rfl: 2 .  Alcohol Swabs (B-D SINGLE USE SWABS REGULAR) PADS, , Disp: , Rfl:  .  Blood Glucose Calibration (ACCU-CHEK SMARTVIEW CONTROL) LIQD, , Disp: , Rfl:  .  Blood Glucose Monitoring Suppl (ACCU-CHEK NANO SMARTVIEW) W/DEVICE KIT, 1 strip by Does not apply route daily., Disp: 1 kit, Rfl: 6 .  IRON PO, Take 45 mg by mouth., Disp: , Rfl:  .  Lancets Misc. (ACCU-CHEK FASTCLIX LANCET) KIT, Test fasting blood sugar daily., Disp: 100 kit, Rfl: 3 .  metFORMIN (GLUCOPHAGE) 500 MG tablet,  TAKE 1 TABLET TWICE DAILY WITH A MEAL, Disp: 180 tablet, Rfl: 1 .  MILK THISTLE PO, Take by mouth., Disp: , Rfl:  .  omeprazole (PRILOSEC) 20 MG capsule, Take 1 capsule (20 mg total) by mouth 2 (two) times daily., Disp: 180 capsule, Rfl: 1 .  Potassium 99 MG TABS, Take by mouth as needed. , Disp: , Rfl:  .  quinapril (ACCUPRIL) 10 MG tablet, Take 1 tablet (10 mg total) by mouth daily., Disp: 90 tablet, Rfl: 1 .  RESTASIS 0.05 % ophthalmic emulsion, , Disp: , Rfl:  .  TURMERIC CURCUMIN PO, Take by mouth., Disp: , Rfl:  .  Vitamin D, Ergocalciferol, (DRISDOL) 50000 units CAPS capsule, Take 1 capsule (50,000 Units total) by mouth once a week., Disp: 12 capsule, Rfl: 1   Patient Care Team: Margo Common, PA as  PCP - General (Family Medicine) Margo Common, PA (Family Medicine) Robert Bellow, MD (General Surgery)      Objective:   Vitals: Vital Signs - Last Recorded  Most recent update: 07/27/2016 9:40 AM by Fabio Neighbors, LPN  BP  355/73 (BP Location: Right Arm)     Pulse  76     Temp  98.2 F (36.8 C) (Oral)     Ht  5' 1"  (1.549 m)     Wt  185 lb (83.9 kg)      BMI  34.96 kg/m     Physical Exam  Constitutional: She is oriented to person, place, and time. She appears well-developed and well-nourished. No distress.  HENT:  Head: Normocephalic and atraumatic.  Right Ear: Hearing normal.  Left Ear: Hearing normal.  Nose: Nose normal.  Eyes: Conjunctivae and lids are normal. Right eye exhibits no discharge. Left eye exhibits no discharge. No scleral icterus.  Pulmonary/Chest: Effort normal. No respiratory distress.  Musculoskeletal: Normal range of motion.  Some stiffness to flex spine. Slight crepitus in the left knee. No pain or swelling. Good pulses through out.  Neurological: She is alert and oriented to person, place, and time.  Skin: Skin is intact. No lesion and no rash noted.  Psychiatric: She has a normal mood and affect. Her speech is normal and  behavior is normal. Thought content normal.   Depression Screen PHQ 2/9 Scores 07/11/2016 01/18/2015 10/14/2014  PHQ - 2 Score 0 0 0  PHQ- 9 Score 1 - -    Assessment & Plan:     Routine Health Maintenance and Physical Exam  Exercise Activities and Dietary recommendations Goals    . Exercise 150 minutes per week (moderate activity)       Immunization History  Administered Date(s) Administered  . Hepatitis A 07/10/2011, 12/25/2011  . Hepatitis B 07/10/2011, 08/11/2011, 12/25/2011  . Influenza Split 02/24/2010, 03/27/2012  . Influenza, High Dose Seasonal PF 01/18/2015, 02/19/2016  . Pneumococcal Conjugate-13 07/13/2014  . Pneumococcal Polysaccharide-23 01/27/2009  . Td 04/08/1996  . Tdap 02/24/2010    Health Maintenance  Topic Date Due  . FOOT EXAM  01/18/2016  . HEMOGLOBIN A1C  02/24/2016  . OPHTHALMOLOGY EXAM  03/28/2017  . COLONOSCOPY  01/31/2018  . TETANUS/TDAP  02/25/2020  . INFLUENZA VACCINE  Completed  . DEXA SCAN  Completed  . PNA vac Low Risk Adult  Completed     Discussed health benefits of physical activity, and encouraged her to engage in regular exercise appropriate for her age and condition.    -------------------------------------------------------------------- 1. Controlled type 2 diabetes mellitus without complication, without long-term current use of insulin (HCC) FBS was 121 this morning and Hgb A1C is 7.1 today. Tolerating Metformin 500 gm BID with low fat diabetic diet. Encouraged to walk 30 minutes 3 times a week for exercise. Microalbumin 20 today. Last foot exam 08-25-15 and ophthalmology exam 03-28-16. Will continue present regimen and follow up pending lab reports. - POCT HgB A1C - POCT UA - Microalbumin  2. Essential hypertension Well controlled on the Accupril 10 mg qd. No chest pains or dyspnea. No peripheral edema.   Lab Results  Component Value Date   CREATININE 0.54 06/30/2016   CREATININE 0.61 12/29/2015   CREATININE 0.61 08/25/2015      3. Esophagitis, reflux No hematemesis, melena or hematochezia. Had evaluation by surgeon (Dr. Bary Castilla) for gallstones. No plans for surgery yet. He gave her dietary instructions and to follow up with him prn. Dyspepsia  well controlled with Omeprazole 20 mg BID.  4. Primary osteoarthritis of left knee Had Synvisc injection by Dr. Jefm Bryant 01-21-16 and feels the knee is much improved. Able to climb stairs and walk with very little discomfort now.  5. Pure hypercholesterolemia Continue low fat diet and recheck lipid panel. May need to consider statin with hyperlipidemia and history of DM. Lab Results  Component Value Date   CHOL 217 (H) 08/25/2015   HDL 59 08/25/2015   LDLCALC 139 (H) 08/25/2015   TRIG 96 08/25/2015   CHOLHDL 3.7 08/25/2015    - Lipid panel     Vernie Murders, [facility]

## 2016-07-27 NOTE — Progress Notes (Signed)
Subjective:   Jennifer [name] is a 76 y.o. female who presents for Medicare Annual (Subsequent) preventive examination.  Review of Systems:  N/A  Cardiac Risk Factors include: advanced age (>46mn, >>26women);diabetes mellitus;dyslipidemia;hypertension;obesity (BMI >30kg/m2)     Objective:     Vitals: BP 132/72 (BP Location: Right Arm)   Pulse 76   Temp 98.2 F (36.8 C) (Oral)   Ht _0  (1.549 m)   Wt 185 lb (83.9 kg)   BMI 34.96 kg/m   Body mass index is 34.96 kg/m.   Tobacco History  Smoking Status  . Former Smoker  . Packs/day: 0.50  . Years: 6.00  . Types: Cigarettes  . Quit date: 05/08/1987  Smokeless Tobacco  . Never Used     Counseling given: Not Answered   Past Medical History:  Diagnosis Date  . Allergy   . Arthritis   . Asthma   . Colon polyp   . [place] [id number] . Fatty liver   . GERD (gastroesophageal reflux disease)   . Hyperlipidemia   . Hypertension   . Mild nonproliferative diabetic retinopathy of both eyes ([place]   . Motion sickness    boats   Past Surgical History:  Procedure Laterality Date  . ABDOMINAL HYSTERECTOMY  1978  . COLONOSCOPY WITH PROPOFOL N/A 02/01/2015   Procedure: COLONOSCOPY WITH PROPOFOL;  Surgeon: DLucilla Lame MD;  [facility]  Service: Endoscopy;  Laterality: N/A;  Diabetic - oral meds  . POLYPECTOMY  02/01/2015   Procedure: POLYPECTOMY;  Surgeon: DLucilla Lame MD;  [facility]  Service: Endoscopy;;  . TONSILLECTOMY AND ADENOIDECTOMY     Family History  Problem Relation Age of Onset  . Dementia Mother   . Alcohol abuse Father   . Cancer Sister     lung  . Lymphoma Sister   . Lymphoma Sister    History  Sexual Activity  . Sexual activity: Not on file    Outpatient Encounter Prescriptions as of 07/27/2016  Medication Sig  . ACCU-CHEK SMARTVIEW test strip CHECK BLOOD SUGAR TWICE DAILY  . albuterol (PROVENTIL HFA;VENTOLIN HFA) 108 (90  Base) MCG/ACT inhaler Inhale 2 puffs into the lungs every 6 (six) hours as needed for wheezing or shortness of breath.  . Alcohol Swabs (B-D SINGLE USE SWABS REGULAR) PADS   . Blood Glucose Calibration (ACCU-CHEK SMARTVIEW CONTROL) LIQD   . Blood Glucose Monitoring Suppl (ACCU-CHEK NANO SMARTVIEW) W/DEVICE KIT 1 strip by Does not apply route daily.  . IRON PO Take 45 mg by mouth.  . Lancets Misc. (ACCU-CHEK FASTCLIX LANCET) KIT Test fasting blood sugar daily.  . metFORMIN (GLUCOPHAGE) 500 MG tablet TAKE 1 TABLET TWICE DAILY WITH A MEAL  . MILK THISTLE PO Take by mouth.  .Marland Kitchenomeprazole (PRILOSEC) 20 MG capsule Take 1 capsule (20 mg total) by mouth 2 (two) times daily.  . Potassium 99 MG TABS Take by mouth as needed.   . quinapril (ACCUPRIL) 10 MG tablet Take 1 tablet (10 mg total) by mouth daily.  . RESTASIS 0.05 % ophthalmic emulsion   . TURMERIC CURCUMIN PO Take by mouth.  . Vitamin D, Ergocalciferol, (DRISDOL) 50000 units CAPS capsule Take 1 capsule (50,000 Units total) by mouth once a week.   No facility-administered encounter medications on file as of 07/27/2016.     Activities of Daily Living In your present state of health, do you have any difficulty performing the following activities: 07/27/2016  Hearing? N  Vision? N  Difficulty concentrating or making decisions? N  Walking or climbing stairs? N  Dressing or bathing? N  Doing errands, shopping? N  Preparing Food and eating ? N  Using the Toilet? N  In the past six months, have you accidently leaked urine? N  Do you have problems with loss of bowel control? N  Managing your Medications? N  Managing your Finances? N  Housekeeping or managing your Housekeeping? N  Some recent data might be hidden    Patient Care Team: Margo Common, PA as PCP - General (Family Medicine) Robert Bellow, MD (General Surgery) D Orion Modest, OD as Consulting Physician (Optometry) Cammie Sickle, MD as Consulting Physician (Internal  Medicine)    Assessment:    Exercise Activities and Dietary recommendations Current Exercise Habits: The patient does not participate in regular exercise at present, Exercise limited by: None identified  Goals    . Exercise           Recommend walking 3 days a week for at least 30 minutes.     . Exercise 150 minutes per week (moderate activity)      Fall Risk Fall Risk  07/27/2016 07/11/2016 01/18/2015 10/14/2014  Falls in the past year? No No No No   Depression Screen PHQ 2/9 Scores 07/27/2016 07/11/2016 01/18/2015 10/14/2014  PHQ - 2 Score 0 0 0 0  PHQ- 9 Score 0 1 - -     Cognitive Function     6CIT Screen 07/27/2016  What Year? 0 points  What month? 0 points  What time? 0 points  Count back from 20 0 points  Months in reverse 0 points  Repeat phrase 4 points  Total Score 4    Immunization History  Administered Date(s) Administered  . Hepatitis A 07/10/2011, 12/25/2011  . Hepatitis B 07/10/2011, 08/11/2011, 12/25/2011  . Influenza Split 02/24/2010, 03/27/2012  . Influenza, High Dose Seasonal PF 01/18/2015, 02/19/2016  . Pneumococcal Conjugate-13 07/13/2014  . Pneumococcal Polysaccharide-23 01/27/2009  . Td 04/08/1996  . Tdap 02/24/2010   Screening Tests Health Maintenance  Topic Date Due  . HEMOGLOBIN A1C  02/24/2016  . FOOT EXAM  08/22/2016  . OPHTHALMOLOGY EXAM  03/28/2017  . COLONOSCOPY  01/31/2018  . TETANUS/TDAP  02/25/2020  . INFLUENZA VACCINE  Completed  . DEXA SCAN  Completed  . PNA vac Low Risk Adult  Completed      Plan:  I have personally reviewed and addressed the Medicare Annual Wellness questionnaire and have noted the following in the patient's chart:  A. Medical and social history B. Use of alcohol, tobacco or illicit drugs  C. Current medications and supplements D. Functional ability and status E.  Nutritional status F.  Physical activity G. Advance directives H. List of other physicians I.  Hospitalizations, surgeries, and ER visits in  previous 12 months J.  [place] such as hearing and vision if needed, cognitive and depression L. Referrals and appointments - none  In addition, I have reviewed and discussed with patient certain preventive protocols, quality metrics, and best practice recommendations. A written personalized care plan for preventive services as well as general preventive health recommendations were provided to patient.  See attached scanned questionnaire for additional information.   Signed,  Fabio Neighbors, LPN Nurse Health Advisor   MD Recommendations: Pt needs Hgb A1c done today.   Reviewed the Health Advisor's note and was available for consultation. Agree with documentation and plan. Reviewed with patient today.  Subjective:   Jennifer [name] is a 76 y.o. female who presents for Medicare Annual (Subsequent) preventive examination.  Review of Systems:   Cardiac Risk Factors include: advanced age (>50mn, >>55women);diabetes mellitus;dyslipidemia;hypertension;obesity (BMI >30kg/m2)     Objective:     Vitals: BP 132/72 (BP Location: Right Arm)   Pulse 76   Temp 98.2 F (36.8 C) (Oral)   Ht _0  (1.549 m)   Wt 185 lb (83.9 kg)   BMI 34.96 kg/m   Body mass index is 34.96 kg/m.   Tobacco History  Smoking Status  . Former Smoker  . Packs/day: 0.50  . Years: 6.00  . Types: Cigarettes  . Quit date: 05/08/1987  Smokeless Tobacco  . Never Used     Counseling given: Not Answered   Past Medical History:  Diagnosis Date  . Allergy   . Arthritis   . Asthma   . Colon polyp   . Diabetes [place] complication [place] [id number] . Fatty liver   . GERD (gastroesophageal reflux disease)   . Hyperlipidemia   . Hypertension   . Mild nonproliferative diabetic retinopathy of both eyes ([place]   . Motion sickness    boats   Past Surgical History:  Procedure Laterality Date  . ABDOMINAL HYSTERECTOMY  1978  . COLONOSCOPY WITH PROPOFOL N/A 02/01/2015    Procedure: COLONOSCOPY WITH PROPOFOL;  Surgeon: DLucilla Lame MD;  [facility]  Service: Endoscopy;  Laterality: N/A;  Diabetic - oral meds  . POLYPECTOMY  02/01/2015   Procedure: POLYPECTOMY;  Surgeon: DLucilla Lame MD;  [facility]  Service: Endoscopy;;  . TONSILLECTOMY AND ADENOIDECTOMY     Family History  Problem Relation Age of Onset  . Dementia Mother   . Alcohol abuse Father   . Cancer Sister     lung  . Lymphoma Sister   . Lymphoma Sister    History  Sexual Activity  . Sexual activity: Not on file    Outpatient Encounter Prescriptions as of 07/27/2016  Medication Sig  . ACCU-CHEK SMARTVIEW test strip CHECK BLOOD SUGAR TWICE DAILY  . albuterol (PROVENTIL HFA;VENTOLIN HFA) 108 (90 Base) MCG/ACT inhaler Inhale 2 puffs into the lungs every 6 (six) hours as needed for wheezing or shortness of breath.  . Alcohol Swabs (B-D SINGLE USE SWABS REGULAR) PADS   . Blood Glucose Calibration (ACCU-CHEK SMARTVIEW CONTROL) LIQD   . Blood Glucose Monitoring Suppl (ACCU-CHEK NANO SMARTVIEW) W/DEVICE KIT 1 strip by Does not apply route daily.  . IRON PO Take 45 mg by mouth.  . Lancets Misc. (ACCU-CHEK FASTCLIX LANCET) KIT Test fasting blood sugar daily.  . metFORMIN (GLUCOPHAGE) 500 MG tablet TAKE 1 TABLET TWICE DAILY WITH A MEAL  . MILK THISTLE PO Take by mouth.  .Marland Kitchenomeprazole (PRILOSEC) 20 MG capsule Take 1 capsule (20 mg total) by mouth 2 (two) times daily.  . Potassium 99 MG TABS Take by mouth as needed.   . quinapril (ACCUPRIL) 10 MG tablet Take 1 tablet (10 mg total) by mouth daily.  . RESTASIS 0.05 % ophthalmic emulsion   . TURMERIC CURCUMIN PO Take by mouth.  . Vitamin D, Ergocalciferol, (DRISDOL) 50000 units CAPS capsule Take 1 capsule (50,000 Units total) by mouth once a week.   No facility-administered encounter medications on file as of 07/27/2016.     Activities of Daily Living In your present state of health, do you have any difficulty  performing the following activities: 07/27/2016  Hearing? N  Vision? N  Difficulty concentrating or making decisions? N  Walking or climbing stairs? N  Dressing or bathing? N  Doing errands, shopping? N  Preparing Food and eating ? N  Using the Toilet? N  In the past six months, have you accidently leaked urine? N  Do you have problems with loss of bowel control? N  Managing your Medications? N  Managing your Finances? N  Housekeeping or managing your Housekeeping? N  Some recent data might be hidden    Patient Care Team: Margo Common, PA as PCP - General (Family Medicine) Robert Bellow, MD (General Surgery) D Orion Modest, OD as Consulting Physician (Optometry) Cammie Sickle, MD as Consulting Physician (Internal Medicine)    Assessment:     Exercise Activities and Dietary recommendations Current Exercise Habits: The patient does not participate in regular exercise at present, Exercise limited by: None identified  Goals    . Exercise           Recommend walking 3 days a week for at least 30 minutes.     . Exercise 150 minutes per week (moderate activity)      Fall Risk Fall Risk  07/27/2016 07/11/2016 01/18/2015 10/14/2014  Falls in the past year? No No No No   Depression Screen PHQ 2/9 Scores 07/27/2016 07/11/2016 01/18/2015 10/14/2014  PHQ - 2 Score 0 0 0 0  PHQ- 9 Score 0 1 - -     Cognitive Function     6CIT Screen 07/27/2016  What Year? 0 points  What month? 0 points  What time? 0 points  Count back from 20 0 points  Months in reverse 0 points  Repeat phrase 4 points  Total Score 4    Immunization History  Administered Date(s) Administered  . Hepatitis A 07/10/2011, 12/25/2011  . Hepatitis B 07/10/2011, 08/11/2011, 12/25/2011  . Influenza Split 02/24/2010, 03/27/2012  . Influenza, High Dose Seasonal PF 01/18/2015, 02/19/2016  . Pneumococcal Conjugate-13 07/13/2014  . Pneumococcal Polysaccharide-23 01/27/2009  . Td 04/08/1996  . Tdap  02/24/2010   Screening Tests Health Maintenance  Topic Date Due  . HEMOGLOBIN A1C  02/24/2016  . FOOT EXAM  08/22/2016  . OPHTHALMOLOGY EXAM  03/28/2017  . COLONOSCOPY  01/31/2018  . TETANUS/TDAP  02/25/2020  . INFLUENZA VACCINE  Completed  . DEXA SCAN  Completed  . PNA vac Low Risk Adult  Completed      Plan:  I have personally reviewed and addressed the Medicare Annual Wellness questionnaire and have noted the following in the patient's chart:  A. Medical and social history B. Use of alcohol, tobacco or illicit drugs  C. Current medications and supplements D. Functional ability and status E.  Nutritional status F.  Physical activity G. Advance directives H. List of other physicians I.  Hospitalizations, surgeries, and ER visits in previous 12 months J.  [place] such as hearing and vision if needed, cognitive and depression L. Referrals and appointments - none  In addition, I have reviewed and discussed with patient certain preventive protocols, quality metrics, and best practice recommendations. A written personalized care plan for preventive services as well as general preventive health recommendations were provided to patient.  See attached scanned questionnaire for additional information.   Signed,  Fabio Neighbors, LPN Nurse Health Advisor   MD Recommendations: Pt needs Hgb A1c done today.

## 2016-07-28 ENCOUNTER — Telehealth: Payer: Self-pay

## 2016-07-28 LAB — LIPID PANEL
CHOL/HDL RATIO: 4.1 ratio (ref 0.0–4.4)
Cholesterol, Total: 205 mg/dL — ABNORMAL HIGH (ref 100–199)
HDL: 50 mg/dL (ref >39)
LDL Calculated: 136 mg/dL — ABNORMAL HIGH (ref 0–99)
Triglycerides: 95 mg/dL (ref 0–149)
VLDL CHOLESTEROL CAL: 19 mg/dL (ref 5–40)

## 2016-07-28 NOTE — Telephone Encounter (Signed)
Pt advised.   She states she is not going to start Simvastatin right now.  She will work on Lifestyle changes.  Thanks,   -Mickel Baas

## 2016-07-28 NOTE — Telephone Encounter (Signed)
-----   Message from Margo Common, [place] sent at 07/28/2016  8:47 AM EDT ----- Cholesterol a little improved. With history of diabetes and still having elevation of LDL, recommend starting Simvastatin 20 mg qd #30 & 3 RF. Recheck cholesterol progress in 3 months.

## 2016-08-02 ENCOUNTER — Ambulatory Visit: Payer: Medicare HMO

## 2016-09-08 ENCOUNTER — Encounter: Payer: Self-pay | Admitting: Family Medicine

## 2016-09-08 ENCOUNTER — Ambulatory Visit (INDEPENDENT_AMBULATORY_CARE_PROVIDER_SITE_OTHER): Payer: Medicare HMO | Admitting: Family Medicine

## 2016-09-08 VITALS — BP 112/68 | HR 78 | Temp 98.1°F | Wt 189.2 lb

## 2016-09-08 DIAGNOSIS — M1712 Unilateral primary osteoarthritis, left knee: Secondary | ICD-10-CM | POA: Diagnosis not present

## 2016-09-08 MED ORDER — TRAMADOL HCL 50 MG PO TABS: 50.0000 mg | ORAL_TABLET | Freq: Three times a day (TID) | ORAL | 0 refills | Status: DC | PRN

## 2016-09-08 NOTE — Progress Notes (Signed)
Patient: Jennifer [name] Female    DOB: [birth year]   76 y.o.   MRN: [id number] Visit Date: 09/08/2016  Today's Provider: Vernie Murders, PA   Chief Complaint  Patient presents with  . Knee Pain   Subjective:    Knee Pain   Incident onset: recurrent. This episode started 4-5 days ago. The pain is present in the left knee. The quality of the pain is described as aching. The pain has been constant since onset. Associated symptoms comments: Inability to bend knee at night . The symptoms are aggravated by weight bearing. Treatments tried: Last injection given by Dr. Jefm Bryant in November.   Patient Active Problem List   Diagnosis Date Noted  . Upper respiratory infection 03/17/2016  . Thrombocytopenia ([place]) 12/29/2015  . Primary osteoarthritis of left knee 09/30/2015  . Special screening for malignant neoplasms, colon   . Benign neoplasm of ascending colon   . Benign neoplasm of transverse colon   . Benign neoplasm of sigmoid colon   . Arthritis 10/21/2014  . Allergic rhinitis 10/14/2014  . Airway hyperreactivity 10/14/2014  . Back ache 10/14/2014  . Bell palsy 10/14/2014  . Controlled diabetes mellitus type II without complication ([place]) 16/60/6301  . Elevation of level of transaminase or lactic acid dehydrogenase (LDH) 10/14/2014  . Calculus of gallbladder 10/14/2014  . HLD (hyperlipidemia) 10/14/2014  . BP (high blood pressure) 10/14/2014  . Gonalgia 10/14/2014  . Benign neoplasm of soft tissues 10/14/2014  . NASH (nonalcoholic steatohepatitis) 10/14/2014  . Adiposity 10/14/2014  . Esophagitis, reflux 10/14/2014  . Enlargement of spleen 10/14/2014  . Change in blood platelet count 10/14/2014  . Avitaminosis D 10/14/2014   Past Surgical History:  Procedure Laterality Date  . ABDOMINAL HYSTERECTOMY  1978  . COLONOSCOPY WITH PROPOFOL N/A 02/01/2015   Procedure: COLONOSCOPY WITH PROPOFOL;  Surgeon: Lucilla Lame, MD;  Location: [facility];  Service: Endoscopy;   Laterality: N/A;  Diabetic - oral meds  . POLYPECTOMY  02/01/2015   Procedure: POLYPECTOMY;  Surgeon: Lucilla Lame, MD;  Location: [facility];  Service: Endoscopy;;  . TONSILLECTOMY AND ADENOIDECTOMY     Family History  Problem Relation Age of Onset  . Dementia Mother   . Alcohol abuse Father   . Cancer Sister     lung  . Lymphoma Sister   . Lymphoma Sister    Allergies  Allergen Reactions  . Celecoxib Other (See Comments)    Hearing loss while taking  . Codeine Other (See Comments)    Pt denies  . Hydrocodone-Acetaminophen Other (See Comments)    Pt denies  . Nitrofurantoin Monohyd Macro     "Sugar got high"  . Penicillins   . Sulfa Antibiotics   . Ciprofloxacin Rash     Previous Medications   ACCU-CHEK SMARTVIEW TEST STRIP    CHECK BLOOD SUGAR TWICE DAILY   ALBUTEROL (PROVENTIL HFA;VENTOLIN HFA) 108 (90 BASE) MCG/ACT INHALER    Inhale 2 puffs into the lungs every 6 (six) hours as needed for wheezing or shortness of breath.   ALCOHOL SWABS (B-D SINGLE USE SWABS REGULAR) PADS       BLOOD GLUCOSE CALIBRATION (ACCU-CHEK SMARTVIEW CONTROL) LIQD       BLOOD GLUCOSE MONITORING SUPPL (ACCU-CHEK NANO SMARTVIEW) W/DEVICE KIT    1 strip by Does not apply route daily.   IRON PO    Take 45 mg by mouth.   LANCETS MISC. (ACCU-CHEK FASTCLIX LANCET) KIT    Test fasting blood sugar daily.  METFORMIN (GLUCOPHAGE) 500 MG TABLET    TAKE 1 TABLET TWICE DAILY WITH A MEAL   MILK THISTLE PO    Take by mouth.   OMEPRAZOLE (PRILOSEC) 20 MG CAPSULE    Take 1 capsule (20 mg total) by mouth 2 (two) times daily.   POTASSIUM 99 MG TABS    Take by mouth as needed.    QUINAPRIL (ACCUPRIL) 10 MG TABLET    Take 1 tablet (10 mg total) by mouth daily.   RESTASIS 0.05 % OPHTHALMIC EMULSION       TURMERIC CURCUMIN PO    Take by mouth.   VITAMIN D, ERGOCALCIFEROL, (DRISDOL) 50000 UNITS CAPS CAPSULE    Take 1 capsule (50,000 Units total) by mouth once a week.    Review of Systems  Constitutional:  Negative.   Respiratory: Negative.   Cardiovascular: Negative.   Musculoskeletal:       Knee pain     Social History  Substance Use Topics  . Smoking status: Former Smoker    Packs/day: 0.50    Years: 6.00    Types: Cigarettes    Quit date: 05/08/1987  . Smokeless tobacco: Never Used  . Alcohol use No   Objective:   BP 112/68 (BP Location: Right Arm, Patient Position: Sitting, Cuff Size: Normal)   Pulse 78   Temp 98.1 F (36.7 C) (Oral)   Wt 189 lb 3.2 oz (85.8 kg)   SpO2 94%   BMI 35.75 kg/m   Physical Exam  Constitutional: She is oriented to person, place, and time. She appears well-developed and well-nourished. No distress.  HENT:  Head: Normocephalic and atraumatic.  Right Ear: Hearing normal.  Left Ear: Hearing normal.  Nose: Nose normal.  Eyes: Conjunctivae and lids are normal. Right eye exhibits no discharge. Left eye exhibits no discharge. No scleral icterus.  Pulmonary/Chest: Effort normal. No respiratory distress.  Musculoskeletal: Normal range of motion. She exhibits tenderness.  Severe pain in the left lateral joint line. Unable to flex much due to severe pain. Mild puffiness down to the left ankle. Good pulses throughout.  Neurological: She is alert and oriented to person, place, and time.  Skin: Skin is intact. No lesion and no rash noted.  Psychiatric: She has a normal mood and affect. Her speech is normal and behavior is normal. Thought content normal.      Assessment & Plan:      1. Primary osteoarthritis of left knee Continues to have pain in the left lateral knee. Unable to flex very far without sharper pains. Worsening over the past week without known injury recently. Had a Synvisc-One injection 02-14-16 by Dr. Jefm Bryant (orthopedist) for severe osteoarthritis seen on MRI (high grade cartilage loss tibia and femoral condyle laterally). Will give analgesic and may use Aspercreme with Lidocaine prn. Recommend she call the orthopedist to get the next  Synvisc-One injection. Prefers to postpone ANY surgical intervention. Recheck prn. - traMADol (ULTRAM) 50 MG tablet; Take 1 tablet (50 mg total) by mouth every 8 (eight) hours as needed.  Dispense: 40 tablet; Refill: 0

## 2016-09-19 DIAGNOSIS — E113293 Type 2 diabetes mellitus with mild nonproliferative diabetic retinopathy without macular edema, bilateral: Secondary | ICD-10-CM | POA: Diagnosis not present

## 2016-09-19 DIAGNOSIS — H04129 Dry eye syndrome of unspecified lacrimal gland: Secondary | ICD-10-CM | POA: Diagnosis not present

## 2016-09-19 DIAGNOSIS — H2513 Age-related nuclear cataract, bilateral: Secondary | ICD-10-CM | POA: Diagnosis not present

## 2016-09-20 DIAGNOSIS — M1712 Unilateral primary osteoarthritis, left knee: Secondary | ICD-10-CM | POA: Diagnosis not present

## 2016-09-20 DIAGNOSIS — E119 Type 2 diabetes mellitus without complications: Secondary | ICD-10-CM | POA: Diagnosis not present

## 2016-09-20 DIAGNOSIS — E669 Obesity, unspecified: Secondary | ICD-10-CM | POA: Diagnosis not present

## 2016-09-28 DIAGNOSIS — M1712 Unilateral primary osteoarthritis, left knee: Secondary | ICD-10-CM | POA: Diagnosis not present

## 2016-09-28 DIAGNOSIS — E119 Type 2 diabetes mellitus without complications: Secondary | ICD-10-CM | POA: Diagnosis not present

## 2016-09-28 DIAGNOSIS — E669 Obesity, unspecified: Secondary | ICD-10-CM | POA: Diagnosis not present

## 2016-09-28 DIAGNOSIS — M25561 Pain in right knee: Secondary | ICD-10-CM | POA: Diagnosis not present

## 2016-09-28 DIAGNOSIS — G5601 Carpal tunnel syndrome, right upper limb: Secondary | ICD-10-CM | POA: Diagnosis not present

## 2016-09-28 DIAGNOSIS — G8929 Other chronic pain: Secondary | ICD-10-CM | POA: Diagnosis not present

## 2016-10-16 ENCOUNTER — Encounter: Payer: Self-pay | Admitting: Family Medicine

## 2016-10-16 ENCOUNTER — Ambulatory Visit (INDEPENDENT_AMBULATORY_CARE_PROVIDER_SITE_OTHER): Payer: Medicare HMO | Admitting: Family Medicine

## 2016-10-16 VITALS — BP 142/74 | HR 72 | Temp 97.5°F | Resp 16 | Wt 174.0 lb

## 2016-10-16 DIAGNOSIS — M791 Myalgia, unspecified site: Secondary | ICD-10-CM

## 2016-10-16 DIAGNOSIS — E119 Type 2 diabetes mellitus without complications: Secondary | ICD-10-CM | POA: Diagnosis not present

## 2016-10-16 DIAGNOSIS — R351 Nocturia: Secondary | ICD-10-CM

## 2016-10-16 DIAGNOSIS — M25562 Pain in left knee: Secondary | ICD-10-CM | POA: Diagnosis not present

## 2016-10-16 DIAGNOSIS — E876 Hypokalemia: Secondary | ICD-10-CM

## 2016-10-16 DIAGNOSIS — M25561 Pain in right knee: Secondary | ICD-10-CM

## 2016-10-16 DIAGNOSIS — R631 Polydipsia: Secondary | ICD-10-CM | POA: Diagnosis not present

## 2016-10-16 LAB — POCT URINALYSIS DIPSTICK
Bilirubin, UA: NEGATIVE
Glucose, UA: NEGATIVE
Ketones, UA: NEGATIVE
Nitrite, UA: NEGATIVE
PH UA: 6 (ref 5.0–8.0)
PROTEIN UA: NEGATIVE
RBC UA: NEGATIVE
SPEC GRAV UA: 1.02 (ref 1.010–1.025)
UROBILINOGEN UA: NEGATIVE U/dL — AB

## 2016-10-16 NOTE — Progress Notes (Signed)
Jennifer [name]  MRN: [id number] DOB: 04-Dec-1940  Subjective:  HPI   The patient is a 76 year old female who presents for evaluation of cramps.  She states has had some occasional leg cramps in the past and was taking K+ daily for that .  She is now having increased leg cramps as well as cramps in her hands.  She has increased her potassium to BID.  She also complains of increased thirst and increased urination with nocturia 2-3 times per night.  No other urinary symptoms. She is diabetic and states she has been checking her glucose and it has been running 70's to 180's.   Patient Active Problem List   Diagnosis Date Noted  . Upper respiratory infection 03/17/2016  . Thrombocytopenia ([place]) 12/29/2015  . Primary osteoarthritis of left knee 09/30/2015  . Special screening for malignant neoplasms, colon   . Benign neoplasm of ascending colon   . Benign neoplasm of transverse colon   . Benign neoplasm of sigmoid colon   . Arthritis 10/21/2014  . Allergic rhinitis 10/14/2014  . Airway hyperreactivity 10/14/2014  . Back ache 10/14/2014  . Bell palsy 10/14/2014  . Controlled diabetes mellitus type II without complication ([place]) 33/00/7622  . Elevation of level of transaminase or lactic acid dehydrogenase (LDH) 10/14/2014  . Calculus of gallbladder 10/14/2014  . HLD (hyperlipidemia) 10/14/2014  . BP (high blood pressure) 10/14/2014  . Gonalgia 10/14/2014  . Benign neoplasm of soft tissues 10/14/2014  . NASH (nonalcoholic steatohepatitis) 10/14/2014  . Adiposity 10/14/2014  . Esophagitis, reflux 10/14/2014  . Enlargement of spleen 10/14/2014  . Change in blood platelet count 10/14/2014  . Avitaminosis D 10/14/2014    Past Medical History:  Diagnosis Date  . Allergy   . Arthritis   . Asthma   . Colon polyp   . Diabetes mellitus without complication ([place]) 6333  . Fatty liver   . GERD (gastroesophageal reflux disease)   . Hyperlipidemia   . Hypertension   . Mild nonproliferative  diabetic retinopathy of both eyes ([place])   . Motion sickness    boats    Social History   Social History  . Marital status: Widowed    Spouse name: N/A  . Number of children: N/A  . Years of education: N/A   Occupational History  . Not on file.   Social History Main Topics  . Smoking status: Former Smoker    Packs/day: 0.50    Years: 6.00    Types: Cigarettes    Quit date: 05/08/1987  . Smokeless tobacco: Never Used  . Alcohol use No  . Drug use: No  . Sexual activity: Not on file   Other Topics Concern  . Not on file   Social History Narrative  . No narrative on file    Outpatient Encounter Prescriptions as of 10/16/2016  Medication Sig Note  . ACCU-CHEK SMARTVIEW test strip CHECK BLOOD SUGAR TWICE DAILY   . albuterol (PROVENTIL HFA;VENTOLIN HFA) 108 (90 Base) MCG/ACT inhaler Inhale 2 puffs into the lungs every 6 (six) hours as needed for wheezing or shortness of breath.   . Alcohol Swabs (B-D SINGLE USE SWABS REGULAR) PADS  10/14/2014: Received from: External Pharmacy  . Blood Glucose Calibration (ACCU-CHEK SMARTVIEW CONTROL) LIQD  10/14/2014: Received from: External Pharmacy  . Blood Glucose Monitoring Suppl (ACCU-CHEK NANO SMARTVIEW) W/DEVICE KIT 1 strip by Does not apply route daily.   . IRON PO Take 45 mg by mouth.   . Lancets Misc. (ACCU-CHEK  FASTCLIX LANCET) KIT Test fasting blood sugar daily.   . metFORMIN (GLUCOPHAGE) 500 MG tablet TAKE 1 TABLET TWICE DAILY WITH A MEAL   . MILK THISTLE PO Take by mouth.   Marland Kitchen omeprazole (PRILOSEC) 20 MG capsule Take 1 capsule (20 mg total) by mouth 2 (two) times daily.   . Potassium 99 MG TABS Take by mouth as needed.    . quinapril (ACCUPRIL) 10 MG tablet Take 1 tablet (10 mg total) by mouth daily.   . traMADol (ULTRAM) 50 MG tablet Take 1 tablet (50 mg total) by mouth every 8 (eight) hours as needed.   . TURMERIC CURCUMIN PO Take by mouth.   . Vitamin D, Ergocalciferol, (DRISDOL) 50000 units CAPS capsule Take 1 capsule (50,000  Units total) by mouth once a week.   . [DISCONTINUED] RESTASIS 0.05 % ophthalmic emulsion  05/12/2015: Received from: External Pharmacy   No facility-administered encounter medications on file as of 10/16/2016.    Family History  Problem Relation Age of Onset  . Dementia Mother   . Alcohol abuse Father   . Cancer Sister        lung  . Lymphoma Sister   . Lymphoma Sister    Allergies  Allergen Reactions  . Celecoxib Other (See Comments)    Hearing loss while taking  . Codeine Other (See Comments)    Pt denies  . Hydrocodone-Acetaminophen Other (See Comments)    Pt denies  . Nitrofurantoin Other (See Comments)  . Nitrofurantoin Monohyd Macro     "Sugar got high"  . Penicillins   . Sulfa Antibiotics Other (See Comments)  . Ciprofloxacin Rash    Review of Systems  Constitutional: Negative for chills and fever.  Genitourinary: Positive for frequency. Negative for dysuria, flank pain and hematuria.  Musculoskeletal: Positive for myalgias.  Endo/Heme/Allergies: Positive for polydipsia.    Objective:  BP (!) 142/74 (BP Location: Right Arm, Patient Position: Sitting, Cuff Size: Normal)   Pulse 72   Temp 97.5 F (36.4 C) (Oral)   Resp 16   Wt 174 lb (78.9 kg)   BMI 32.88 kg/m   Physical Exam  Constitutional: She is oriented to person, place, and time and well-developed, well-nourished, and in no distress.  HENT:  Head: Normocephalic.  Eyes: Conjunctivae are normal.  Neck: Neck supple.  Cardiovascular: Normal rate and regular rhythm.   Pulmonary/Chest: Effort normal and breath sounds normal.  Abdominal: Soft. Bowel sounds are normal.  Musculoskeletal:  Soreness in both calves. No swelling or joint redness. No significant joint stiffness. Slight puffiness in the right lateral knee at site of Flexogenix injection by Dr. Jefm Bryant 09-28-16.  Neurological: She is alert and oriented to person, place, and time.  Psychiatric: Memory, affect and judgment normal.    Assessment and  Plan :   1. Myalgia Recent Flexogenix injection by Dr. Jefm Bryant (rheumatologist) on 09-28-16. Having muscle pains and joint discomfort the past 2-3 weeks. Will check labs for signs of electrolyte imbalance and inflammation with rhabdomyolysis. Increase fluid intake and recheck pending lab reports. - Comprehensive metabolic panel - CK - Sedimentation rate  2. Arthralgia of both knees History of osteoarthritis followed by Dr. Jefm Bryant (rheumatologist). - Comprehensive metabolic panel - CK - Sedimentation rate  3. Nocturia Urinary frequency with some increase in thirst over the past 2-3 weeks. States sugar have been in appropriate ranges at home. Urinalysis showed positive leukocytes. Denies burning or stinging with urination. Will get urine C&S and CMP. May need antibiotic. - Comprehensive metabolic  panel - Urine Culture - POCT urinalysis dipstick  4. Hypokalemia K+ was down to 3.3 on 07-11-16. Will recheck CMP for continued hypokalemia despite use of potassium supplement (OTC BID) with hot weather recently. Increase fluid intake and follow up pending reports. - Comprehensive metabolic panel  5. Controlled type 2 diabetes mellitus without complication, without long-term current use of insulin (HCC) Some polydipsia, urinary frequency and nocturia the past few days. States FBS in the 120-130 range at home. Continue Metformin 500 mg BID, diet and exercise regimen. Recheck CMP. - Comprehensive metabolic panel

## 2016-10-17 LAB — COMPREHENSIVE METABOLIC PANEL
A/G RATIO: 1.4 (ref 1.2–2.2)
ALT: 45 IU/L — AB (ref 0–32)
AST: 53 IU/L — ABNORMAL HIGH (ref 0–40)
Albumin: 4.1 g/dL (ref 3.5–4.8)
Alkaline Phosphatase: 88 IU/L (ref 39–117)
BUN/Creatinine Ratio: 17 (ref 12–28)
BUN: 12 mg/dL (ref 8–27)
Bilirubin Total: 1 mg/dL (ref 0.0–1.2)
CALCIUM: 9.8 mg/dL (ref 8.7–10.3)
CO2: 28 mmol/L (ref 20–29)
Chloride: 96 mmol/L (ref 96–106)
Creatinine, Ser: 0.71 mg/dL (ref 0.57–1.00)
GFR, EST AFRICAN AMERICAN: 96 mL/min/{1.73_m2} (ref >59)
GFR, EST NON AFRICAN AMERICAN: 83 mL/min/{1.73_m2} (ref >59)
Globulin, Total: 3 g/dL (ref 1.5–4.5)
Glucose: 115 mg/dL — ABNORMAL HIGH (ref 65–99)
POTASSIUM: 4.4 mmol/L (ref 3.5–5.2)
Sodium: 138 mmol/L (ref 134–144)
TOTAL PROTEIN: 7.1 g/dL (ref 6.0–8.5)

## 2016-10-17 LAB — CK: Total CK: 75 U/L (ref 24–173)

## 2016-10-17 LAB — SEDIMENTATION RATE: Sed Rate: 4 mm/hr (ref 0–40)

## 2016-10-17 NOTE — Progress Notes (Signed)
Advised  ED 

## 2016-10-18 LAB — URINE CULTURE

## 2016-11-10 ENCOUNTER — Telehealth: Payer: Self-pay

## 2016-11-10 ENCOUNTER — Other Ambulatory Visit: Payer: Self-pay

## 2016-11-10 DIAGNOSIS — E559 Vitamin D deficiency, unspecified: Secondary | ICD-10-CM

## 2016-11-10 DIAGNOSIS — I10 Essential (primary) hypertension: Secondary | ICD-10-CM

## 2016-11-10 DIAGNOSIS — K2100 Gastro-esophageal reflux disease with esophagitis, without bleeding: Secondary | ICD-10-CM

## 2016-11-10 DIAGNOSIS — E119 Type 2 diabetes mellitus without complications: Secondary | ICD-10-CM

## 2016-11-10 MED ORDER — OMEPRAZOLE 20 MG PO CPDR: 20.0000 mg | DELAYED_RELEASE_CAPSULE | Freq: Two times a day (BID) | ORAL | 1 refills | Status: DC

## 2016-11-10 MED ORDER — GLUCOSE BLOOD VI STRP: ORAL_STRIP | 1 refills | Status: DC

## 2016-11-10 MED ORDER — QUINAPRIL HCL 10 MG PO TABS: 10.0000 mg | ORAL_TABLET | Freq: Every day | ORAL | 1 refills | Status: DC

## 2016-11-10 MED ORDER — VITAMIN D (ERGOCALCIFEROL) 1.25 MG (50000 UNIT) PO CAPS: 50000.0000 [IU] | ORAL_CAPSULE | ORAL | 1 refills | Status: DC

## 2016-11-10 NOTE — Telephone Encounter (Signed)
Pt advised and appointment made-aa

## 2016-11-10 NOTE — Telephone Encounter (Signed)
Patient called and states that about 3 weeks ago her sugar reading was around 75 to 90 so she stopped taking Metformin at that time and has been checking her sugar and reading has been 106-117 at the highest so far. No hypoglycemic episodes. Patient is eating better/healthier and feels good. She wanted to let you know-aa

## 2016-11-10 NOTE — Telephone Encounter (Signed)
Patient needs refills on medications pulled down in meds and order, she has follow up appointment with you in September but will not have enough till then. Please review, [facility] [address]-aa

## 2016-11-10 NOTE — Telephone Encounter (Signed)
OK, but should check Hgb A1C with labs in September/October.

## 2016-12-04 ENCOUNTER — Other Ambulatory Visit: Payer: Self-pay | Admitting: Family Medicine

## 2016-12-04 DIAGNOSIS — K2100 Gastro-esophageal reflux disease with esophagitis, without bleeding: Secondary | ICD-10-CM

## 2016-12-04 DIAGNOSIS — E119 Type 2 diabetes mellitus without complications: Secondary | ICD-10-CM

## 2016-12-04 DIAGNOSIS — I10 Essential (primary) hypertension: Secondary | ICD-10-CM

## 2016-12-22 ENCOUNTER — Other Ambulatory Visit: Payer: Self-pay | Admitting: Family Medicine

## 2016-12-22 DIAGNOSIS — E559 Vitamin D deficiency, unspecified: Secondary | ICD-10-CM

## 2016-12-22 NOTE — Telephone Encounter (Addendum)
[facility] faxed a refill request on the following medications:  Vitamin D, Ergocalciferol, (DRISDOL) 50000 units CAPS  capsule  Accu-Chek Aviva Plus Meter     Accu-Chek Aviva Plus test strips      Accu-Chek Aviva softclix lancets       BD single use swab  [facility] mail order/MW

## 2016-12-24 NOTE — Telephone Encounter (Signed)
Chart shows the Vitamin-D was refilled for 3 months with one refill and [facility] sent confirmation of receipt on 11-10-16 (which should last 6 months). Refilled supplies for the Accu-Chek Nano on 12-04-16 for 6 months. If the insurance is changing the glucometer they want her to use, send in authorization for these with the same instructions for BID sugar checks.

## 2016-12-25 ENCOUNTER — Ambulatory Visit: Payer: Medicare HMO

## 2016-12-26 NOTE — Telephone Encounter (Signed)
I called [facility] and pharmacist states they did not recieve prescription on 11/10/2016. Prescription for Vitamin D verbally called into the pharmacy. I also authorized glucometer, strips, lancets and swabs.

## 2016-12-26 NOTE — Telephone Encounter (Signed)
Please review. This was sent to me personally in response but I am not on triage today. Thank you-aa

## 2016-12-27 ENCOUNTER — Inpatient Hospital Stay: Payer: Medicare HMO

## 2016-12-27 ENCOUNTER — Inpatient Hospital Stay: Payer: Medicare HMO | Admitting: Internal Medicine

## 2016-12-29 ENCOUNTER — Other Ambulatory Visit: Payer: Self-pay

## 2016-12-29 MED ORDER — GLUCOSE BLOOD VI STRP: ORAL_STRIP | 0 refills | Status: DC

## 2017-01-02 ENCOUNTER — Other Ambulatory Visit: Payer: Self-pay | Admitting: Family Medicine

## 2017-01-02 DIAGNOSIS — E559 Vitamin D deficiency, unspecified: Secondary | ICD-10-CM

## 2017-01-02 MED ORDER — GLUCOSE BLOOD VI STRP: ORAL_STRIP | 3 refills | Status: DC

## 2017-01-02 MED ORDER — ACCU-CHEK AVIVA DEVI: 0 refills | Status: DC

## 2017-01-02 MED ORDER — VITAMIN D (ERGOCALCIFEROL) 1.25 MG (50000 UNIT) PO CAPS: 50000.0000 [IU] | ORAL_CAPSULE | ORAL | 1 refills | Status: DC

## 2017-01-02 NOTE — Telephone Encounter (Addendum)
[facility] pharmacy faxed a request on the following medications. Thanks CC  Vitamin D, Ergocalciferol, (DRISDOL) 50000 units CAPS capsule   glucose blood (ACCU-CHEK AVIVA) test strip   Accu-Chek aviva plus Meter  Lancets Misc. (ACCU-CHEK FASTCLIX LANCET) KIT   Alcohol Swabs (B-D SINGLE USE SWABS REGULAR) PADS

## 2017-01-16 ENCOUNTER — Telehealth: Payer: Self-pay

## 2017-01-16 ENCOUNTER — Ambulatory Visit
Admission: RE | Admit: 2017-01-16 | Discharge: 2017-01-16 | Disposition: A | Discharge status: Home / Self Care | Payer: Medicare HMO | Source: Ambulatory Visit | Attending: Family Medicine | Admitting: Family Medicine

## 2017-01-16 ENCOUNTER — Encounter: Payer: Self-pay | Admitting: Family Medicine

## 2017-01-16 ENCOUNTER — Ambulatory Visit (INDEPENDENT_AMBULATORY_CARE_PROVIDER_SITE_OTHER): Payer: Medicare HMO | Admitting: Family Medicine

## 2017-01-16 VITALS — BP 112/68 | HR 76 | Temp 98.1°F | Wt 190.2 lb

## 2017-01-16 DIAGNOSIS — M25562 Pain in left knee: Secondary | ICD-10-CM | POA: Diagnosis not present

## 2017-01-16 DIAGNOSIS — M5442 Lumbago with sciatica, left side: Secondary | ICD-10-CM

## 2017-01-16 DIAGNOSIS — M47896 Other spondylosis, lumbar region: Secondary | ICD-10-CM | POA: Diagnosis not present

## 2017-01-16 DIAGNOSIS — M5136 Other intervertebral disc degeneration, lumbar region: Secondary | ICD-10-CM | POA: Diagnosis not present

## 2017-01-16 DIAGNOSIS — R350 Frequency of micturition: Secondary | ICD-10-CM

## 2017-01-16 DIAGNOSIS — I10 Essential (primary) hypertension: Secondary | ICD-10-CM | POA: Diagnosis not present

## 2017-01-16 DIAGNOSIS — E119 Type 2 diabetes mellitus without complications: Secondary | ICD-10-CM | POA: Diagnosis not present

## 2017-01-16 DIAGNOSIS — M545 Low back pain: Secondary | ICD-10-CM | POA: Diagnosis not present

## 2017-01-16 LAB — POCT GLYCOSYLATED HEMOGLOBIN (HGB A1C): Hemoglobin A1C: 6.6

## 2017-01-16 LAB — POCT URINALYSIS DIPSTICK
BILIRUBIN UA: NEGATIVE
GLUCOSE UA: NEGATIVE
KETONES UA: NEGATIVE
NITRITE UA: NEGATIVE
Protein, UA: NEGATIVE
Spec Grav, UA: 1.025 (ref 1.010–1.025)
Urobilinogen, UA: 0.2 E.U./dL
pH, UA: 6.5 (ref 5.0–8.0)

## 2017-01-16 MED ORDER — IBUPROFEN 600 MG PO TABS: 600.0000 mg | ORAL_TABLET | Freq: Three times a day (TID) | ORAL | 0 refills | Status: DC | PRN

## 2017-01-16 MED ORDER — CIPROFLOXACIN HCL 250 MG PO TABS: 250.0000 mg | ORAL_TABLET | Freq: Two times a day (BID) | ORAL | 0 refills | Status: DC

## 2017-01-16 NOTE — Telephone Encounter (Signed)
-----   Message from Margo Common, [place] sent at 01/16/2017  3:25 PM EDT ----- No bony abnormality in the left knee. The back x-rays show arthritis and degenerative disc disease at the L4-5 area. Also, shows a calcified gallstone that she saw Dr. Bary Castilla about in March 2018.

## 2017-01-16 NOTE — Telephone Encounter (Signed)
LMTCB

## 2017-01-16 NOTE — Progress Notes (Signed)
Patient: Jennifer [name] Female    DOB: February 06, 1941   76 y.o.   MRN: [id number] Visit Date: 01/16/2017  Today's Provider: Vernie Murders, PA   Chief Complaint  Patient presents with  . Hypertension  . Hyperlipidemia  . Diabetes  . Follow-up   Subjective:    Back Pain  This is a new problem. Episode onset: 8 days ago. The problem occurs constantly. The problem has been gradually worsening since onset. The pain is present in the lumbar spine. The pain radiates to the left thigh and left knee. The symptoms are aggravated by lying down. Associated symptoms include leg pain. (Edema) Risk factors include obesity. Treatments tried: OTC cream.    Diabetes Mellitus Type II, Follow-up:   Lab Results  Component Value Date   HGBA1C 7.1 07/27/2016   HGBA1C 6.9 08/25/2015   HGBA1C 6.9 (H) 01/18/2015   Last seen for diabetes 6 months ago.  Management since then includes continue medications. She reports good compliance with treatment. She is not having side effects.  Current symptoms include leg swelling. Home blood sugar records: fasting range: low 100's  Episodes of hypoglycemia? no   Current Insulin Regimen: none Most Recent Eye Exam: 03/28/16 Weight trend: stable Current diet: in general, a "healthy" diet   Current exercise: no regular exercise  ------------------------------------------------------------------------   Hypertension, follow-up:  BP Readings from Last 3 Encounters:  01/16/17 112/68  10/16/16 (!) 142/74  09/08/16 112/68    She was last seen for hypertension 6 months ago.  BP at that visit was 132/72. Management since that visit includes continue medications.She reports good compliance with treatment. She is not having side effects.  She is not exercising. She is adherent to low salt diet.   Outside blood pressures are being checked. She is experiencing lower extremity edema.  Patient denies chest pain, chest pressure/discomfort, irregular heart beat and  palpitations.   Cardiovascular risk factors include advanced age (older than 3 for men, 20 for women), diabetes mellitus, dyslipidemia, hypertension and obesity (BMI >= 30 kg/m2).    ------------------------------------------------------------------------    Lipid/Cholesterol, Follow-up:   Last seen for this 6 months ago.  Management since that visit includes recommended starting statin medication. Patient preferred to work on lifestyle changes first.  Last Lipid Panel:    Component Value Date/Time   CHOL 205 (H) 07/27/2016 1121   TRIG 95 07/27/2016 1121   HDL 50 07/27/2016 1121   CHOLHDL 4.1 07/27/2016 1121   LDLCALC 136 (H) 07/27/2016 1121    She reports fair compliance with treatment. She is not having side effects.   Wt Readings from Last 3 Encounters:  01/16/17 190 lb 3.2 oz (86.3 kg)  10/16/16 174 lb (78.9 kg)  09/08/16 189 lb 3.2 oz (85.8 kg)    ------------------------------------------------------------------------ Past Medical History:  Diagnosis Date  . Allergy   . Arthritis   . Asthma   . Colon polyp   . Diabetes mellitus without complication ([place]) 0300  . Fatty liver   . GERD (gastroesophageal reflux disease)   . Hyperlipidemia   . Hypertension   . Mild nonproliferative diabetic retinopathy of both eyes ([place])   . Motion sickness    boats   Past Surgical History:  Procedure Laterality Date  . ABDOMINAL HYSTERECTOMY  1978  . COLONOSCOPY WITH PROPOFOL N/A 02/01/2015   Procedure: COLONOSCOPY WITH PROPOFOL;  Surgeon: Lucilla Lame, MD;  Location: [facility];  Service: Endoscopy;  Laterality: N/A;  Diabetic - oral meds  . POLYPECTOMY  02/01/2015   Procedure: POLYPECTOMY;  Surgeon: Lucilla Lame, MD;  Location: [facility];  Service: Endoscopy;;  . TONSILLECTOMY AND ADENOIDECTOMY     Family History  Problem Relation Age of Onset  . Dementia Mother   . Alcohol abuse Father   . Cancer Sister        lung  . Lymphoma Sister   . Lymphoma  Sister    Allergies  Allergen Reactions  . Celecoxib Other (See Comments)    Hearing loss while taking  . Codeine Other (See Comments)    Pt denies  . Hydrocodone-Acetaminophen Other (See Comments)    Pt denies  . Nitrofurantoin Other (See Comments)  . Nitrofurantoin Monohyd Macro     "Sugar got high"  . Penicillins   . Sulfa Antibiotics Other (See Comments)  . Ciprofloxacin Rash    Patient denies reaction. Past documentation indicates insignificant rash.     Previous Medications   ALBUTEROL (PROVENTIL HFA;VENTOLIN HFA) 108 (90 BASE) MCG/ACT INHALER    Inhale 2 puffs into the lungs every 6 (six) hours as needed for wheezing or shortness of breath.   ALCOHOL SWABS (B-D SINGLE USE SWABS REGULAR) PADS       BLOOD GLUCOSE CALIBRATION (ACCU-CHEK SMARTVIEW CONTROL) LIQD       BLOOD GLUCOSE MONITORING SUPPL (ACCU-CHEK AVIVA) DEVICE    Use as instructed   BLOOD GLUCOSE MONITORING SUPPL (ACCU-CHEK NANO SMARTVIEW) W/DEVICE KIT    1 strip by Does not apply route daily.   GLUCOSE BLOOD (ACCU-CHEK AVIVA) TEST STRIP    Check sugar twice daily DX E 11.9   IRON PO    Take 45 mg by mouth.   LANCETS MISC. (ACCU-CHEK FASTCLIX LANCET) KIT    Test fasting blood sugar daily.   METFORMIN (GLUCOPHAGE) 500 MG TABLET    TAKE 1 TABLET TWICE DAILY WITH A MEAL   MILK THISTLE PO    Take by mouth.   OMEPRAZOLE (PRILOSEC) 20 MG CAPSULE    TAKE 1 CAPSULE TWICE DAILY   POTASSIUM 99 MG TABS    Take by mouth as needed.    QUINAPRIL (ACCUPRIL) 10 MG TABLET    TAKE 1 TABLET EVERY DAY   TRAMADOL (ULTRAM) 50 MG TABLET    Take 1 tablet (50 mg total) by mouth every 8 (eight) hours as needed.   TURMERIC CURCUMIN PO    Take by mouth.   VITAMIN D, ERGOCALCIFEROL, (DRISDOL) 50000 UNITS CAPS CAPSULE    Take 1 capsule (50,000 Units total) by mouth once a week.    Review of Systems  Constitutional: Negative.   Respiratory: Negative.   Cardiovascular: Positive for leg swelling.  Endocrine: Negative.   Musculoskeletal:  Positive for back pain.    Social History  Substance Use Topics  . Smoking status: Former Smoker    Packs/day: 0.50    Years: 6.00    Types: Cigarettes    Quit date: 05/08/1987  . Smokeless tobacco: Never Used  . Alcohol use No   Objective:   BP 112/68 (BP Location: Right Arm, Patient Position: Sitting, Cuff Size: Large)   Pulse 76   Temp 98.1 F (36.7 C) (Oral)   Wt 190 lb 3.2 oz (86.3 kg)   SpO2 96%   BMI 35.94 kg/m   Physical Exam  Constitutional: She is oriented to person, place, and time. She appears well-developed and well-nourished. No distress.  HENT:  Head: Normocephalic and atraumatic.  Right Ear: Hearing normal.  Left Ear: Hearing normal.  Nose:  Nose normal.  Eyes: Conjunctivae and lids are normal. Right eye exhibits no discharge. Left eye exhibits no discharge. No scleral icterus.  Neck: Neck supple.  Cardiovascular: Normal rate and regular rhythm.   Pulmonary/Chest: Effort normal and breath sounds normal. No respiratory distress.  Abdominal: Soft. Bowel sounds are normal.  Musculoskeletal:  Pain in left lateral knee to test ligament strength. No crepitus or locking. Aching pain with some radiation from lower back to the posterior thigh with twisting movements. Negative Homan's sign and SLR's to 90 degrees without pain.  Neurological: She is alert and oriented to person, place, and time. She has normal reflexes.  Skin: Skin is intact. No lesion and no rash noted.  Psychiatric: She has a normal mood and affect. Her speech is normal and behavior is normal. Thought content normal.      Assessment & Plan:     1. Controlled type 2 diabetes mellitus without complication, without long-term current use of insulin (HCC) Feeling well and tolerating Metformin 500 mg BID. FBS was 124 this morning and Hgb A1C today was 6.6%. Foot exam showed decreased sensation over both heels but no lesions or ingrown nails. Will continue present dosage and diabetic diet. Recheck in 3  months. - POCT HgB A1C  2. Urinary frequency Recent increase in urine frequency without dysuria. Urinalysis showed showed many WBC's on microscopic exam with a few RBC's. Will get C&S and start Cipro (patient requested this antibiotic because it work very well in the past - states she has not had any significant reactions of allergy). Increase fluid intake and may use AZO-Standard prn discomfort. Recheck pending culture report. - POCT Urinalysis Dipstick - Urine Culture - ciprofloxacin (CIPRO) 250 MG tablet; Take 1 tablet (250 mg total) by mouth 2 (two) times daily.  Dispense: 6 tablet; Refill: 0  3. Acute left-sided low back pain with left-sided sciatica Started 8 days ago after staying with a friend for 12 days and helping him in his recuperation from hip replacement. States she was doing a great deal of manual labor cleaning his house, changing his bandage and giving him his medication. Pain is sharp with certain movements and radiates down the left posterior thigh to the knee. Will get x-ray evaluation and given Ibuprofen for pain/inflammation. States she has taken this medication in the past without the side effect/reaction she had with Celebrex. Recheck pending x-ray reports. - DG Lumbar Spine Complete - ibuprofen (ADVIL,MOTRIN) 600 MG tablet; Take 1 tablet (600 mg total) by mouth every 8 (eight) hours as needed.  Dispense: 30 tablet; Refill: 0  4. Acute pain of left knee Tenderness along the lateral left knee to test collateral ligament. Started with the low back pain 8 days ago. Will get x-ray evaluation and may use Ibuprofen for inflammation. Recheck pending reports. - DG Knee Complete 4 Views Left - ibuprofen (ADVIL,MOTRIN) 600 MG tablet; Take 1 tablet (600 mg total) by mouth every 8 (eight) hours as needed.  Dispense: 30 tablet; Refill: 0  5. Essential hypertension Well controlled on the Accupril 10 mg qd. Continue present dosage and limit sodium/salt intake.

## 2017-01-17 ENCOUNTER — Inpatient Hospital Stay: Payer: Medicare HMO | Attending: Internal Medicine

## 2017-01-17 ENCOUNTER — Inpatient Hospital Stay (HOSPITAL_BASED_OUTPATIENT_CLINIC_OR_DEPARTMENT_OTHER): Payer: Medicare HMO | Admitting: Internal Medicine

## 2017-01-17 VITALS — BP 157/78 | HR 78 | Temp 97.8°F | Resp 20 | Ht 61.0 in | Wt 190.0 lb

## 2017-01-17 DIAGNOSIS — R161 Splenomegaly, not elsewhere classified: Secondary | ICD-10-CM

## 2017-01-17 DIAGNOSIS — K219 Gastro-esophageal reflux disease without esophagitis: Secondary | ICD-10-CM | POA: Diagnosis not present

## 2017-01-17 DIAGNOSIS — E785 Hyperlipidemia, unspecified: Secondary | ICD-10-CM | POA: Insufficient documentation

## 2017-01-17 DIAGNOSIS — M545 Low back pain: Secondary | ICD-10-CM | POA: Diagnosis not present

## 2017-01-17 DIAGNOSIS — Z7984 Long term (current) use of oral hypoglycemic drugs: Secondary | ICD-10-CM | POA: Insufficient documentation

## 2017-01-17 DIAGNOSIS — D696 Thrombocytopenia, unspecified: Secondary | ICD-10-CM | POA: Diagnosis not present

## 2017-01-17 DIAGNOSIS — K760 Fatty (change of) liver, not elsewhere classified: Secondary | ICD-10-CM | POA: Diagnosis not present

## 2017-01-17 DIAGNOSIS — I10 Essential (primary) hypertension: Secondary | ICD-10-CM | POA: Insufficient documentation

## 2017-01-17 DIAGNOSIS — K7460 Unspecified cirrhosis of liver: Secondary | ICD-10-CM | POA: Diagnosis not present

## 2017-01-17 DIAGNOSIS — J45909 Unspecified asthma, uncomplicated: Secondary | ICD-10-CM | POA: Insufficient documentation

## 2017-01-17 DIAGNOSIS — K8020 Calculus of gallbladder without cholecystitis without obstruction: Secondary | ICD-10-CM | POA: Diagnosis not present

## 2017-01-17 DIAGNOSIS — E119 Type 2 diabetes mellitus without complications: Secondary | ICD-10-CM | POA: Insufficient documentation

## 2017-01-17 DIAGNOSIS — Z79899 Other long term (current) drug therapy: Secondary | ICD-10-CM

## 2017-01-17 DIAGNOSIS — Z87891 Personal history of nicotine dependence: Secondary | ICD-10-CM | POA: Diagnosis not present

## 2017-01-17 LAB — URINE CULTURE
MICRO NUMBER: 80999585
SPECIMEN QUALITY: ADEQUATE

## 2017-01-17 LAB — COMPREHENSIVE METABOLIC PANEL
ALT: 24 U/L (ref 14–54)
ANION GAP: 8 (ref 5–15)
AST: 37 U/L (ref 15–41)
Albumin: 3.4 g/dL — ABNORMAL LOW (ref 3.5–5.0)
Alkaline Phosphatase: 61 U/L (ref 38–126)
BUN: 9 mg/dL (ref 6–20)
CO2: 28 mmol/L (ref 22–32)
Calcium: 9.2 mg/dL (ref 8.9–10.3)
Chloride: 100 mmol/L — ABNORMAL LOW (ref 101–111)
Creatinine, Ser: 0.6 mg/dL (ref 0.44–1.00)
GLUCOSE: 150 mg/dL — AB (ref 65–99)
POTASSIUM: 3.8 mmol/L (ref 3.5–5.1)
Sodium: 136 mmol/L (ref 135–145)
TOTAL PROTEIN: 6.5 g/dL (ref 6.5–8.1)
Total Bilirubin: 1.5 mg/dL — ABNORMAL HIGH (ref 0.3–1.2)

## 2017-01-17 LAB — CBC WITH DIFFERENTIAL/PLATELET
BASOS PCT: 1 %
Basophils Absolute: 0 10*3/uL (ref 0.0–0.1)
Eosinophils Absolute: 0 10*3/uL (ref 0.0–0.7)
Eosinophils Relative: 2 %
HEMATOCRIT: 34.8 % — AB (ref 35.0–47.0)
Hemoglobin: 12.1 g/dL (ref 12.0–16.0)
Lymphocytes Relative: 27 %
Lymphs Abs: 0.7 10*3/uL (ref 0.7–4.0)
MCH: 30.5 pg (ref 26.0–34.0)
MCHC: 34.6 g/dL (ref 32.0–36.0)
MCV: 87.9 fL (ref 80.0–100.0)
MONO ABS: 0.2 10*3/uL (ref 0.1–1.0)
MONOS PCT: 9 %
NEUTROS ABS: 1.5 10*3/uL — AB (ref 1.7–7.7)
Neutrophils Relative %: 62 %
Platelets: 70 10*3/uL — ABNORMAL LOW (ref 150–440)
RBC: 3.96 MIL/uL (ref 3.80–5.20)
RDW: 13.9 % (ref 11.5–14.5)
WBC: 2.4 10*3/uL — ABNORMAL LOW (ref 3.6–11.0)

## 2017-01-17 NOTE — Telephone Encounter (Signed)
Patient advised. She wanted to know any recommendations?  She is also requesting a RX be sent (pt. Requested [facility], but advised patient the RX may have to be sent to a medical supply company) so she can get Depends covered by insurance for nocturia and incontinence. She states she is having to repeatedly change her bed linens at night due to urine soiling linens.Please advise.

## 2017-01-17 NOTE — Telephone Encounter (Signed)
Pt returned call  Please try to call before 11 am  Thanks teri

## 2017-01-17 NOTE — Progress Notes (Signed)
[facility] NOTE  Patient Care Team: Chrismon, Vickki Muff, PA as PCP - General (Family Medicine) Bary Castilla, Forest Gleason, MD (General Surgery) Anell Barr, OD as Consulting Physician (Optometry) Cammie Sickle, MD as Consulting Physician (Internal Medicine)  CHIEF COMPLAINTS/PURPOSE OF CONSULTATION:   # THROMBOCYTOPENIA- [2015-100s-80s; 2017] N-Hb/WBC;  Hypersplenism/splenomegaly; no clumping.   #  Cirrhosis/ splenomegaly [US- 2016; stable since 2013]; Hep B/Hep C negative on 12/29/15.  HISTORY OF PRESENTING ILLNESS:  Jennifer [name] 76 y.o.  female Caucasian patient history of compensated cirrhosis/splenomegaly [based on imaging] is here for follow-up.  Denies any bleeding gums. Denies any bleeding nose. No blood in stools. No unusual shortness of breath or cough.  ROS: Denies any unusual swelling in the legs. Denies any skin rash. A complete 10 point review of system is done which is negative except mentioned above in history of present illness  MEDICAL HISTORY:  Past Medical History:  Diagnosis Date  . Allergy   . Arthritis   . Asthma   . Colon polyp   . Diabetes mellitus without complication ([place]) 5277  . Fatty liver   . GERD (gastroesophageal reflux disease)   . Hyperlipidemia   . Hypertension   . Mild nonproliferative diabetic retinopathy of both eyes ([place])   . Motion sickness    boats    SURGICAL HISTORY: Past Surgical History:  Procedure Laterality Date  . ABDOMINAL HYSTERECTOMY  1978  . COLONOSCOPY WITH PROPOFOL N/A 02/01/2015   Procedure: COLONOSCOPY WITH PROPOFOL;  Surgeon: Lucilla Lame, MD;  Location: [facility];  Service: Endoscopy;  Laterality: N/A;  Diabetic - oral meds  . POLYPECTOMY  02/01/2015   Procedure: POLYPECTOMY;  Surgeon: Lucilla Lame, MD;  Location: [facility];  Service: Endoscopy;;  . TONSILLECTOMY AND ADENOIDECTOMY      SOCIAL HISTORY: Social History   Social History  . Marital status: Widowed     Spouse name: N/A  . Number of children: N/A  . Years of education: N/A   Occupational History  . Not on file.   Social History Main Topics  . Smoking status: Former Smoker    Packs/day: 0.50    Years: 6.00    Types: Cigarettes    Quit date: 05/08/1987  . Smokeless tobacco: Never Used  . Alcohol use No  . Drug use: No  . Sexual activity: Not on file   Other Topics Concern  . Not on file   Social History Narrative  . No narrative on file    FAMILY HISTORY: Family History  Problem Relation Age of Onset  . Dementia Mother   . Alcohol abuse Father   . Cancer Sister        lung  . Lymphoma Sister   . Lymphoma Sister     ALLERGIES:  is allergic to celecoxib; codeine; hydrocodone-acetaminophen; nitrofurantoin; nitrofurantoin monohyd macro; penicillins; and sulfa antibiotics.  MEDICATIONS:  Current Outpatient Prescriptions  Medication Sig Dispense Refill  . albuterol (PROVENTIL HFA;VENTOLIN HFA) 108 (90 Base) MCG/ACT inhaler Inhale 2 puffs into the lungs every 6 (six) hours as needed for wheezing or shortness of breath. 1 Inhaler 2  . Alcohol Swabs (B-D SINGLE USE SWABS REGULAR) PADS     . Blood Glucose Calibration (ACCU-CHEK SMARTVIEW CONTROL) LIQD     . Blood Glucose Monitoring Suppl (ACCU-CHEK AVIVA) device Use as instructed 1 each 0  . Blood Glucose Monitoring Suppl (ACCU-CHEK NANO SMARTVIEW) W/DEVICE KIT 1 strip by Does not apply route daily. 1  kit 6  . glucose blood (ACCU-CHEK AVIVA) test strip Check sugar twice daily DX E 11.9 100 each 3  . ibuprofen (ADVIL,MOTRIN) 600 MG tablet Take 1 tablet (600 mg total) by mouth every 8 (eight) hours as needed. 30 tablet 0  . IRON PO Take 45 mg by mouth.    . Lancets Misc. (ACCU-CHEK FASTCLIX LANCET) KIT Test fasting blood sugar daily. 100 kit 3  . metFORMIN (GLUCOPHAGE) 500 MG tablet TAKE 1 TABLET TWICE DAILY WITH A MEAL 180 tablet 1  . MILK THISTLE PO Take by mouth.    Marland Kitchen omeprazole (PRILOSEC) 20 MG capsule TAKE 1 CAPSULE  TWICE DAILY 180 capsule 1  . Potassium 99 MG TABS Take by mouth as needed.     . quinapril (ACCUPRIL) 10 MG tablet TAKE 1 TABLET EVERY DAY 90 tablet 1  . TURMERIC CURCUMIN PO Take by mouth.    . Vitamin D, Ergocalciferol, (DRISDOL) 50000 units CAPS capsule Take 1 capsule (50,000 Units total) by mouth once a week. 12 capsule 1  . doxycycline (VIBRA-TABS) 100 MG tablet Take 1 tablet (100 mg total) by mouth 2 (two) times daily. 14 tablet 0   No current facility-administered medications for this visit.       Marland Kitchen  PHYSICAL EXAMINATION:   Vitals:   01/17/17 1130  BP: (!) 157/78  Pulse: 78  Resp: 20  Temp: 97.8 F (36.6 C)   Filed Weights   01/17/17 1138  Weight: 190 lb (86.2 kg)    GENERAL: Well-nourished well-developed; Alert, no distress and comfortable.  Alone.  EYES: no pallor or icterus OROPHARYNX: no thrush or ulceration; good dentition  NECK: supple, no masses felt LYMPH:  no palpable lymphadenopathy in the cervical, axillary or inguinal regions LUNGS: clear to auscultation and  No wheeze or crackles HEART/CVS: regular rate & rhythm and no murmurs; No lower extremity edema ABDOMEN: abdomen soft, non-tender and normal bowel sounds; positive for splenomegaly.  Musculoskeletal:no cyanosis of digits and no clubbing  PSYCH: alert & oriented x 3 with fluent speech NEURO: no focal motor/sensory deficits SKIN:  no rashes; About 2 cm raised more on the left hip- not changing.   LABORATORY DATA:  I have reviewed the data as listed Lab Results  Component Value Date   WBC 2.4 (L) 01/17/2017   HGB 12.1 01/17/2017   HCT 34.8 (L) 01/17/2017   MCV 87.9 01/17/2017   PLT 70 (L) 01/17/2017    Recent Labs  06/30/16 1006 10/16/16 1242 01/17/17 1101  NA 136 138 136  K 3.8 4.4 3.8  CL 101 96 100*  CO2 _0 GLUCOSE 148* 115* 150*  BUN _1 CREATININE 0.54 0.71 0.60  CALCIUM 9.1 9.8 9.2  GFRNONAA >60 83 >60  GFRAA >60 96 >60  PROT 6.9 7.1 6.5  ALBUMIN 3.7 4.1 3.4*   AST 47* 53* 37  ALT 27 45* 24  ALKPHOS 75 88 61  BILITOT 1.0 1.0 1.5*    RADIOGRAPHIC STUDIES: I have personally reviewed the radiological images as listed and agreed with the findings in the report. Dg Lumbar Spine Complete  Result Date: 01/16/2017 CLINICAL DATA:  76 year old with acute onset of low back pain radiating into the left lower extremity that began 8 days ago. No known injuries. EXAM: LUMBAR SPINE - COMPLETE 4+ VIEW COMPARISON:  None. FINDINGS: Five non-rib-bearing lumbar vertebrae with anatomic posterior alignment. Slight straightening of the usual lordosis. Slight lumbar levoscoliosis. No fractures. Moderate to severe disc space  narrowing with associated endplate hypertrophic changes at L4-5. Mild disc space narrowing at L2-3 and L5-S1. Remaining disc spaces well-preserved. Mild facet degenerative changes at L4-5. No pars defects. Sacroiliac joints intact. Calcified approximate 1.3 cm gallstone in the right upper quadrant of the abdomen. IMPRESSION: 1. No acute osseous abnormality. 2. Multilevel degenerative disc disease and spondylosis, worst at L4-5. 3. Facet degenerative changes at L4-5. 4. Incidental finding of cholelithiasis. Electronically Signed   By: Evangeline Dakin M.D.   On: 01/16/2017 14:12   Dg Knee Complete 4 Views Left  Result Date: 01/16/2017 CLINICAL DATA:  Acute left knee pain EXAM: LEFT KNEE - COMPLETE 4+ VIEW COMPARISON:  MRI 09/14/2015 FINDINGS: No evidence of fracture, dislocation, or joint effusion. No evidence of arthropathy or other focal bone abnormality. Soft tissues are unremarkable. IMPRESSION: Negative. Electronically Signed   By: Franchot Gallo M.D.   On: 01/16/2017 14:06    ASSESSMENT & PLAN:  Thrombocytopenia ([place]) # Thrombocytopenia secondary to ? Mild spleen/cirrhosis. Decreasing/ relatively stable  - 70. Asymptomatic. Last abdominal ultrasound feb 2018.  # Discussed that if platelets continue to drop- bone marrow could potentially be  recommended for further evaluation.  # Cirrhosis/no decompensation. Stable.   # follow up 6 months/labs        Cammie Sickle, MD 02/02/2017 9:41 PM

## 2017-01-17 NOTE — Assessment & Plan Note (Addendum)
#   Thrombocytopenia secondary to ? Mild spleen/cirrhosis. Decreasing/ relatively stable  - 70. Asymptomatic. Last abdominal ultrasound feb 2018.  # Discussed that if platelets continue to drop- bone marrow could potentially be recommended for further evaluation.  # Cirrhosis/no decompensation. Stable.   # follow up 6 months/labs

## 2017-01-18 ENCOUNTER — Telehealth: Payer: Self-pay | Admitting: Family Medicine

## 2017-01-18 MED ORDER — DOXYCYCLINE HYCLATE 100 MG PO TABS: 100.0000 mg | ORAL_TABLET | Freq: Two times a day (BID) | ORAL | 0 refills | Status: DC

## 2017-01-18 NOTE — Telephone Encounter (Signed)
Need to use the Ibuprofen 600 mg QID as prescribed on 01-16-17 and apply moist heat or ice pack to the back. May have copy of back x-ray report. If back not improving in 5-7 days with rest and this treatment, may need referral to a spine specialist. Should see Dr. Bary Castilla about the gallstone to hear options for treatment. No significant growth on urine culture and no sign of infection on CBC. May have Doxycycline 100 mg BID #14 for any gallbladder and minor urinary tract infection.

## 2017-01-18 NOTE — Telephone Encounter (Signed)
Prescription written for pick up at the front desk.

## 2017-01-18 NOTE — Telephone Encounter (Signed)
Patient advised.

## 2017-01-18 NOTE — Telephone Encounter (Signed)
Pt would like a copy of her x reports.  She would like to pick it up when it is printed.  Pt would like to speak with some one about still not feeling well  Pt' call back is [phone]  Thanks teri

## 2017-01-18 NOTE — Telephone Encounter (Signed)
Patient advised. X Ray report left at front desk for pick up. RX sent to [facility].

## 2017-01-30 ENCOUNTER — Encounter: Payer: Self-pay | Admitting: General Surgery

## 2017-01-30 ENCOUNTER — Ambulatory Visit (INDEPENDENT_AMBULATORY_CARE_PROVIDER_SITE_OTHER): Payer: Medicare HMO | Admitting: General Surgery

## 2017-01-30 VITALS — BP 148/88 | HR 92 | Resp 14 | Ht 61.0 in | Wt 192.0 lb

## 2017-01-30 DIAGNOSIS — M545 Low back pain: Secondary | ICD-10-CM

## 2017-01-30 NOTE — Patient Instructions (Addendum)
The patient is aware to call back for any questions or concerns. The patient is aware to use a heating pad as needed for comfort.

## 2017-01-30 NOTE — Progress Notes (Signed)
Patient ID: Jennifer [name], female   DOB: [birth year], 76 y.o.   MRN: [id number]  Chief Complaint  Patient presents with  . Follow-up    HPI Jennifer [name] is a 76 y.o. female.  Here today for follow up gallstones referred by Vernie Murders PA. She states that 2 weeks ago she was helping her significant other, Izell [place], put on his compression hose and she felt like she hurt her lower back. She also had a kidney infection and was given Cipro and doxycycline to help with that.  She has tried ibuprofen alternating with heat and ice for the pain. She states Dennis Chrismon did xray's and thought it might be her gallstones. The pain comes and goes. Denies abdominal pain.  HPI  Past Medical History:  Diagnosis Date  . Allergy   . Arthritis   . Asthma   . Colon polyp   . Diabetes mellitus without complication ([place]) 0240  . Fatty liver   . GERD (gastroesophageal reflux disease)   . Hyperlipidemia   . Hypertension   . Mild nonproliferative diabetic retinopathy of both eyes ([place])   . Motion sickness    boats    Past Surgical History:  Procedure Laterality Date  . ABDOMINAL HYSTERECTOMY  1978  . COLONOSCOPY WITH PROPOFOL N/A 02/01/2015   Procedure: COLONOSCOPY WITH PROPOFOL;  Surgeon: Lucilla Lame, MD;  Location: [facility];  Service: Endoscopy;  Laterality: N/A;  Diabetic - oral meds  . POLYPECTOMY  02/01/2015   Procedure: POLYPECTOMY;  Surgeon: Lucilla Lame, MD;  Location: [facility];  Service: Endoscopy;;  . TONSILLECTOMY AND ADENOIDECTOMY      Family History  Problem Relation Age of Onset  . Dementia Mother   . Alcohol abuse Father   . Cancer Sister        lung  . Lymphoma Sister   . Lymphoma Sister     Social History Social History  Substance Use Topics  . Smoking status: Former Smoker    Packs/day: 0.50    Years: 6.00    Types: Cigarettes    Quit date: 05/08/1987  . Smokeless tobacco: Never Used  . Alcohol use No    Allergies  Allergen Reactions  .  Celecoxib Other (See Comments)    Hearing loss while taking  . Codeine Other (See Comments)    Pt denies  . Hydrocodone-Acetaminophen Other (See Comments)    Pt denies  . Nitrofurantoin Other (See Comments)  . Nitrofurantoin Monohyd Macro     "Sugar got high"  . Penicillins   . Sulfa Antibiotics Other (See Comments)    Current Outpatient Prescriptions  Medication Sig Dispense Refill  . albuterol (PROVENTIL HFA;VENTOLIN HFA) 108 (90 Base) MCG/ACT inhaler Inhale 2 puffs into the lungs every 6 (six) hours as needed for wheezing or shortness of breath. 1 Inhaler 2  . Alcohol Swabs (B-D SINGLE USE SWABS REGULAR) PADS     . Blood Glucose Calibration (ACCU-CHEK SMARTVIEW CONTROL) LIQD     . Blood Glucose Monitoring Suppl (ACCU-CHEK AVIVA) device Use as instructed 1 each 0  . Blood Glucose Monitoring Suppl (ACCU-CHEK NANO SMARTVIEW) W/DEVICE KIT 1 strip by Does not apply route daily. 1 kit 6  . doxycycline (VIBRA-TABS) 100 MG tablet Take 1 tablet (100 mg total) by mouth 2 (two) times daily. 14 tablet 0  . glucose blood (ACCU-CHEK AVIVA) test strip Check sugar twice daily DX E 11.9 100 each 3  . ibuprofen (ADVIL,MOTRIN) 600 MG tablet Take 1 tablet (600  mg total) by mouth every 8 (eight) hours as needed. 30 tablet 0  . IRON PO Take 45 mg by mouth.    . Lancets Misc. (ACCU-CHEK FASTCLIX LANCET) KIT Test fasting blood sugar daily. 100 kit 3  . metFORMIN (GLUCOPHAGE) 500 MG tablet TAKE 1 TABLET TWICE DAILY WITH A MEAL 180 tablet 1  . MILK THISTLE PO Take by mouth.    Marland Kitchen omeprazole (PRILOSEC) 20 MG capsule TAKE 1 CAPSULE TWICE DAILY 180 capsule 1  . Potassium 99 MG TABS Take by mouth as needed.     . quinapril (ACCUPRIL) 10 MG tablet TAKE 1 TABLET EVERY DAY 90 tablet 1  . TURMERIC CURCUMIN PO Take by mouth.    . Vitamin D, Ergocalciferol, (DRISDOL) 50000 units CAPS capsule Take 1 capsule (50,000 Units total) by mouth once a week. 12 capsule 1   No current facility-administered medications for this  visit.     Review of Systems Review of Systems  Constitutional: Negative.   Respiratory: Negative.   Cardiovascular: Negative.   Gastrointestinal: Negative for abdominal pain, nausea and vomiting.    Blood pressure (!) 148/88, pulse 92, resp. rate 14, height 5' 1" (1.549 m), weight 192 lb (87.1 kg).  Physical Exam Physical Exam  Constitutional: She is oriented to person, place, and time. She appears well-developed and well-nourished.  HENT:  Mouth/Throat: Oropharynx is clear and moist.  Eyes: Conjunctivae are normal. No scleral icterus.  Neck: Neck supple.  Cardiovascular: Normal rate, regular rhythm and normal heart sounds.   Pulmonary/Chest: Effort normal and breath sounds normal.  Abdominal: She exhibits distension.  Lymphadenopathy:    She has no cervical adenopathy.  Neurological: She is alert and oriented to person, place, and time.  Skin: Skin is warm and dry.  1.5 cm burn patch at T12 right side  Psychiatric: Her behavior is normal.    Data Reviewed Prior abdominal ultrasound showing long-standing cholelithiasis.  Assessment    Low back pain secondary to musculoskeletal source.    Plan    No clear symptoms related to the patient's biliary tract. Increased surgical risk based on her cirrhosis. Observation alone or to this time.  Care with the use of a heating pad encouraged.     The patient is aware to use a heating pad as needed for comfort. Follow up as needed.  HPI, Physical Exam, Assessment and Plan have been scribed under the direction and in the presence of Robert Bellow, MD. Karie Fetch, RN  I have completed the exam and reviewed the above documentation for accuracy and completeness.  I agree with the above.  Haematologist has been used and any errors in dictation or transcription are unintentional.  Hervey Ard, M.D., F.A.C.S.  Robert Bellow 01/31/2017, 8:25 PM

## 2017-01-31 DIAGNOSIS — M545 Low back pain, unspecified: Secondary | ICD-10-CM | POA: Insufficient documentation

## 2017-02-05 ENCOUNTER — Ambulatory Visit (INDEPENDENT_AMBULATORY_CARE_PROVIDER_SITE_OTHER): Payer: Medicare HMO | Admitting: Family Medicine

## 2017-02-05 ENCOUNTER — Encounter: Payer: Self-pay | Admitting: Family Medicine

## 2017-02-05 DIAGNOSIS — M545 Low back pain, unspecified: Secondary | ICD-10-CM

## 2017-02-05 DIAGNOSIS — Z23 Encounter for immunization: Secondary | ICD-10-CM | POA: Diagnosis not present

## 2017-02-05 MED ORDER — PREDNISONE 10 MG PO TABS: ORAL_TABLET | ORAL | 0 refills | Status: DC

## 2017-02-05 MED ORDER — CYCLOBENZAPRINE HCL 5 MG PO TABS: 5.0000 mg | ORAL_TABLET | Freq: Three times a day (TID) | ORAL | 1 refills | Status: DC | PRN

## 2017-02-05 NOTE — Progress Notes (Signed)
Patient: Jennifer [name] Female    DOB: 1940/07/30   76 y.o.   MRN: [id number] Visit Date: 02/05/2017  Today's Provider: Vernie Murders, PA   Chief Complaint  Patient presents with  . Back Pain  . Follow-up   Subjective:    Back Pain  The current episode started 1 to 4 weeks ago. The problem occurs constantly. The pain is present in the lumbar spine. The quality of the pain is described as aching. The pain does not radiate. The symptoms are aggravated by lying down. Treatments tried: heat,Ice,Cipro, Doxycycline, X-Rays and followed up with Dr. Terri Piedra for possible gallstones causing the back pain.  Back x-rays showed multilevel degenerative disc disease and facet degenerative changes at L4-5 on 01-16-17.  Past Medical History:  Diagnosis Date  . Allergy   . Arthritis   . Asthma   . Colon polyp   . Diabetes mellitus without complication ([place]) 9326  . Fatty liver   . GERD (gastroesophageal reflux disease)   . Hyperlipidemia   . Hypertension   . Mild nonproliferative diabetic retinopathy of both eyes ([place])   . Motion sickness    boats   Past Surgical History:  Procedure Laterality Date  . ABDOMINAL HYSTERECTOMY  1978  . COLONOSCOPY WITH PROPOFOL N/A 02/01/2015   Procedure: COLONOSCOPY WITH PROPOFOL;  Surgeon: Lucilla Lame, MD;  Location: [facility];  Service: Endoscopy;  Laterality: N/A;  Diabetic - oral meds  . POLYPECTOMY  02/01/2015   Procedure: POLYPECTOMY;  Surgeon: Lucilla Lame, MD;  Location: [facility];  Service: Endoscopy;;  . TONSILLECTOMY AND ADENOIDECTOMY     Family History  Problem Relation Age of Onset  . Dementia Mother   . Alcohol abuse Father   . Cancer Sister        lung  . Lymphoma Sister   . Lymphoma Sister    Allergies  Allergen Reactions  . Celecoxib Other (See Comments)    Hearing loss while taking  . Codeine Other (See Comments)    Pt denies  . Hydrocodone-Acetaminophen Other (See Comments)    Pt denies  . Nitrofurantoin  Other (See Comments)  . Nitrofurantoin Monohyd Macro     "Sugar got high"  . Penicillins   . Sulfa Antibiotics Other (See Comments)     Previous Medications   ALBUTEROL (PROVENTIL HFA;VENTOLIN HFA) 108 (90 BASE) MCG/ACT INHALER    Inhale 2 puffs into the lungs every 6 (six) hours as needed for wheezing or shortness of breath.   ALCOHOL SWABS (B-D SINGLE USE SWABS REGULAR) PADS       BLOOD GLUCOSE CALIBRATION (ACCU-CHEK SMARTVIEW CONTROL) LIQD       BLOOD GLUCOSE MONITORING SUPPL (ACCU-CHEK AVIVA) DEVICE    Use as instructed   BLOOD GLUCOSE MONITORING SUPPL (ACCU-CHEK NANO SMARTVIEW) W/DEVICE KIT    1 strip by Does not apply route daily.   GLUCOSE BLOOD (ACCU-CHEK AVIVA) TEST STRIP    Check sugar twice daily DX E 11.9   IBUPROFEN (ADVIL,MOTRIN) 600 MG TABLET    Take 1 tablet (600 mg total) by mouth every 8 (eight) hours as needed.   IRON PO    Take 45 mg by mouth.   LANCETS MISC. (ACCU-CHEK FASTCLIX LANCET) KIT    Test fasting blood sugar daily.   METFORMIN (GLUCOPHAGE) 500 MG TABLET    TAKE 1 TABLET TWICE DAILY WITH A MEAL   MILK THISTLE PO    Take by mouth.   OMEPRAZOLE (PRILOSEC) 20 MG CAPSULE  TAKE 1 CAPSULE TWICE DAILY   POTASSIUM 99 MG TABS    Take by mouth as needed.    QUINAPRIL (ACCUPRIL) 10 MG TABLET    TAKE 1 TABLET EVERY DAY   TURMERIC CURCUMIN PO    Take by mouth.   VITAMIN D, ERGOCALCIFEROL, (DRISDOL) 50000 UNITS CAPS CAPSULE    Take 1 capsule (50,000 Units total) by mouth once a week.    Review of Systems  Constitutional: Negative.   Respiratory: Negative.   Cardiovascular: Negative.   Musculoskeletal: Positive for back pain.    Social History  Substance Use Topics  . Smoking status: Former Smoker    Packs/day: 0.50    Years: 6.00    Types: Cigarettes    Quit date: 05/08/1987  . Smokeless tobacco: Never Used  . Alcohol use No   Objective:   BP 128/72 (BP Location: Left Arm, Patient Position: Sitting, Cuff Size: Normal)   Pulse 77   Temp 98.2 F (36.8 C)  (Oral)   Wt 189 lb (85.7 kg)   SpO2 97%   BMI 35.71 kg/m  Wt Readings from Last 3 Encounters:  02/05/17 189 lb (85.7 kg)  01/30/17 192 lb (87.1 kg)  01/17/17 190 lb (86.2 kg)    Physical Exam  Constitutional: She is oriented to person, place, and time. She appears well-developed and well-nourished. No distress.  HENT:  Head: Normocephalic and atraumatic.  Right Ear: Hearing normal.  Left Ear: Hearing normal.  Nose: Nose normal.  Eyes: Conjunctivae and lids are normal. Right eye exhibits no discharge. Left eye exhibits no discharge. No scleral icterus.  Pulmonary/Chest: Effort normal. No respiratory distress.  Musculoskeletal: Normal range of motion.  Neurological: She is alert and oriented to person, place, and time.  Skin: Skin is intact. No lesion and no rash noted.  Psychiatric: She has a normal mood and affect. Her speech is normal and behavior is normal. Thought content normal.      Assessment & Plan:      1. Acute right-sided low back pain without sciatica Persistent right low back pain over the past 3-4 weeks. X-rays showed degenerative disc disease and facet degenerative changes at L4-5 on 01-16-17. No radiation of pain. Spasms occur with certain movements. Has only slight help from a heating pad. May need physical therapy and/or spine specialist referral. Recheck progress in 2 weeks. - predniSONE (DELTASONE) 10 MG tablet; Taper dose every two days by mouth (6,6,5,5,4,4,3,3,2,2,1,1)  Dispense: 42 tablet; Refill: 0 - cyclobenzaprine (FLEXERIL) 5 MG tablet; Take 1 tablet (5 mg total) by mouth 3 (three) times daily as needed for muscle spasms.  Dispense: 30 tablet; Refill: 1  2. Need for influenza vaccination - Flu vaccine HIGH DOSE PF

## 2017-02-07 ENCOUNTER — Other Ambulatory Visit: Payer: Self-pay | Admitting: Family Medicine

## 2017-02-07 DIAGNOSIS — Z1231 Encounter for screening mammogram for malignant neoplasm of breast: Secondary | ICD-10-CM

## 2017-02-09 MED ORDER — BACLOFEN 5 MG PO TABS: 5.0000 mg | ORAL_TABLET | Freq: Three times a day (TID) | ORAL | 0 refills | Status: DC | PRN

## 2017-02-09 NOTE — Progress Notes (Signed)
[employer] insurance does not cover Cyclobenzaprine for this age group. Will switch to Baclofen for muscle spasms. Recheck prn.

## 2017-02-16 ENCOUNTER — Other Ambulatory Visit: Payer: Self-pay | Admitting: Family Medicine

## 2017-02-16 DIAGNOSIS — M545 Low back pain, unspecified: Secondary | ICD-10-CM

## 2017-03-13 ENCOUNTER — Other Ambulatory Visit: Payer: Self-pay | Admitting: Family Medicine

## 2017-03-13 DIAGNOSIS — E119 Type 2 diabetes mellitus without complications: Secondary | ICD-10-CM

## 2017-03-23 ENCOUNTER — Ambulatory Visit
Admission: RE | Admit: 2017-03-23 | Discharge: 2017-03-23 | Disposition: A | Discharge status: Home / Self Care | Payer: Medicare HMO | Source: Ambulatory Visit | Attending: Family Medicine | Admitting: Family Medicine

## 2017-03-23 DIAGNOSIS — Z1231 Encounter for screening mammogram for malignant neoplasm of breast: Secondary | ICD-10-CM | POA: Diagnosis not present

## 2017-03-27 DIAGNOSIS — H401130 Primary open-angle glaucoma, bilateral, stage unspecified: Secondary | ICD-10-CM | POA: Diagnosis not present

## 2017-03-27 DIAGNOSIS — E113293 Type 2 diabetes mellitus with mild nonproliferative diabetic retinopathy without macular edema, bilateral: Secondary | ICD-10-CM | POA: Diagnosis not present

## 2017-03-27 DIAGNOSIS — H2513 Age-related nuclear cataract, bilateral: Secondary | ICD-10-CM | POA: Diagnosis not present

## 2017-03-27 DIAGNOSIS — H04129 Dry eye syndrome of unspecified lacrimal gland: Secondary | ICD-10-CM | POA: Diagnosis not present

## 2017-03-27 LAB — HM DIABETES EYE EXAM

## 2017-03-30 ENCOUNTER — Encounter: Payer: Self-pay | Admitting: Family Medicine

## 2017-03-30 ENCOUNTER — Ambulatory Visit: Payer: Medicare HMO | Admitting: Family Medicine

## 2017-03-30 VITALS — BP 124/72 | HR 78 | Temp 97.9°F | Resp 16 | Wt 192.2 lb

## 2017-03-30 DIAGNOSIS — J069 Acute upper respiratory infection, unspecified: Secondary | ICD-10-CM

## 2017-03-30 MED ORDER — ALBUTEROL SULFATE HFA 108 (90 BASE) MCG/ACT IN AERS: 2.0000 | INHALATION_SPRAY | Freq: Four times a day (QID) | RESPIRATORY_TRACT | 0 refills | Status: DC | PRN

## 2017-03-30 MED ORDER — BENZONATATE 200 MG PO CAPS: 200.0000 mg | ORAL_CAPSULE | Freq: Two times a day (BID) | ORAL | 0 refills | Status: DC | PRN

## 2017-03-30 MED ORDER — AZITHROMYCIN 250 MG PO TABS: ORAL_TABLET | ORAL | 0 refills | Status: DC

## 2017-03-30 NOTE — Progress Notes (Signed)
Patient: Jennifer [name] Female    DOB: [birth year]   76 y.o.   MRN: [id number] Visit Date: 03/30/2017  Today's Provider: Vernie Murders, PA   Chief Complaint  Patient presents with  . Cough  . congestion   Subjective:    URI   This is a new problem. Episode onset: Friday. The problem has been unchanged. There has been no fever. Associated symptoms include congestion, coughing (worse at night) and wheezing. Treatments tried: Mucinex D, Robitussin. The treatment provided no relief.   Past Medical History:  Diagnosis Date  . Allergy   . Arthritis   . Asthma   . Colon polyp   . Diabetes mellitus without complication ([place]) 2836  . Fatty liver   . GERD (gastroesophageal reflux disease)   . Hyperlipidemia   . Hypertension   . Mild nonproliferative diabetic retinopathy of both eyes ([place])   . Motion sickness    boats   Past Surgical History:  Procedure Laterality Date  . ABDOMINAL HYSTERECTOMY  1978  . COLONOSCOPY WITH PROPOFOL N/A 02/01/2015   Procedure: COLONOSCOPY WITH PROPOFOL;  Surgeon: Lucilla Lame, MD;  Location: [facility];  Service: Endoscopy;  Laterality: N/A;  Diabetic - oral meds  . POLYPECTOMY  02/01/2015   Procedure: POLYPECTOMY;  Surgeon: Lucilla Lame, MD;  Location: [place] [facility];  Service: Endoscopy;;  . TONSILLECTOMY AND ADENOIDECTOMY     Family History  Problem Relation Age of Onset  . Dementia Mother   . Alcohol abuse Father   . Cancer Sister        lung  . Lymphoma Sister   . Lymphoma Sister    Allergies  Allergen Reactions  . Celecoxib Other (See Comments)    Hearing loss while taking  . Codeine Other (See Comments)    Pt denies  . Hydrocodone-Acetaminophen Other (See Comments)    Pt denies  . Nitrofurantoin Other (See Comments)  . Nitrofurantoin Monohyd Macro     "Sugar got high"  . Penicillins   . Sulfa Antibiotics Other (See Comments)    Current Outpatient Medications:  .  albuterol (PROVENTIL HFA;VENTOLIN HFA) 108 (90  Base) MCG/ACT inhaler, Inhale 2 puffs into the lungs every 6 (six) hours as needed for wheezing or shortness of breath., Disp: 1 Inhaler, Rfl: 2 .  Alcohol Swabs (B-D SINGLE USE SWABS REGULAR) PADS, , Disp: , Rfl:  .  Baclofen 5 MG TABS, Take 5 mg by mouth 3 (three) times daily as needed. Three times daily for muscle spasms, Disp: 30 tablet, Rfl: 0 .  Blood Glucose Calibration (ACCU-CHEK SMARTVIEW CONTROL) LIQD, , Disp: , Rfl:  .  Blood Glucose Monitoring Suppl (ACCU-CHEK AVIVA PLUS) w/Device KIT, Test fasting blood sugar once a day., Disp: 1 kit, Rfl: 0 .  Blood Glucose Monitoring Suppl (ACCU-CHEK AVIVA) device, Use as instructed, Disp: 1 each, Rfl: 0 .  glucose blood (ACCU-CHEK AVIVA) test strip, Check sugar twice daily DX E 11.9, Disp: 100 each, Rfl: 3 .  IRON PO, Take 45 mg by mouth., Disp: , Rfl:  .  Lancets Misc. (ACCU-CHEK FASTCLIX LANCET) KIT, Test fasting blood sugar daily., Disp: 100 kit, Rfl: 3 .  metFORMIN (GLUCOPHAGE) 500 MG tablet, TAKE 1 TABLET TWICE DAILY WITH A MEAL, Disp: 180 tablet, Rfl: 1 .  MILK THISTLE PO, Take by mouth., Disp: , Rfl:  .  omeprazole (PRILOSEC) 20 MG capsule, TAKE 1 CAPSULE TWICE DAILY, Disp: 180 capsule, Rfl: 1 .  Potassium 99 MG TABS, Take  by mouth as needed. , Disp: , Rfl:  .  predniSONE (DELTASONE) 10 MG tablet, Taper dose every two days by mouth (6,6,5,5,4,4,3,3,2,2,1,1), Disp: 42 tablet, Rfl: 0 .  quinapril (ACCUPRIL) 10 MG tablet, TAKE 1 TABLET EVERY DAY, Disp: 90 tablet, Rfl: 1 .  TURMERIC CURCUMIN PO, Take by mouth., Disp: , Rfl:  .  Vitamin D, Ergocalciferol, (DRISDOL) 50000 units CAPS capsule, Take 1 capsule (50,000 Units total) by mouth once a week., Disp: 12 capsule, Rfl: 1 .  ibuprofen (ADVIL,MOTRIN) 600 MG tablet, Take 1 tablet (600 mg total) by mouth every 8 (eight) hours as needed. (Patient not taking: Reported on 02/05/2017), Disp: 30 tablet, Rfl: 0  Review of Systems  Constitutional: Negative.   HENT: Positive for congestion.     Respiratory: Positive for cough (worse at night) and wheezing.   Cardiovascular: Negative.    Social History   Tobacco Use  . Smoking status: Former Smoker    Packs/day: 0.50    Years: 6.00    Pack years: 3.00    Types: Cigarettes    Last attempt to quit: 05/08/1987    Years since quitting: 29.9  . Smokeless tobacco: Never Used  Substance Use Topics  . Alcohol use: No   Objective:   BP 124/72 (BP Location: Right Arm, Patient Position: Sitting, Cuff Size: Normal)   Pulse 78   Temp 97.9 F (36.6 C) (Oral)   Resp 16   Wt 192 lb 3.2 oz (87.2 kg)   SpO2 97%   BMI 36.32 kg/m   Physical Exam  Constitutional: She is oriented to person, place, and time. She appears well-developed and well-nourished. No distress.  HENT:  Head: Normocephalic and atraumatic.  Right Ear: Hearing and external ear normal.  Left Ear: Hearing and external ear normal.  Nose: Nose normal.  Reddened bilateral tonsillar pillars without exudates. Tender across ethmoid region bilaterally.  Eyes: Conjunctivae and lids are normal. Right eye exhibits no discharge. Left eye exhibits no discharge. No scleral icterus.  Neck: Neck supple.  Pulmonary/Chest: Effort normal and breath sounds normal. No respiratory distress.  Musculoskeletal: Normal range of motion.  Lymphadenopathy:    She has no cervical adenopathy.  Neurological: She is alert and oriented to person, place, and time.  Skin: Skin is intact. No lesion and no rash noted.  Psychiatric: She has a normal mood and affect. Her speech is normal and behavior is normal. Thought content normal.      Assessment & Plan:     1. URI with cough and congestion Onset over the past week with cough and occasional sputum production. Early bronchitis suspected. Cough and some wheezing at night. Will treat with Benzonatate, Proventil and Z-pak. Increase fluid intake and recheck if no better in 5-7 days. - azithromycin (ZITHROMAX) 250 MG tablet; Take two tablets by mouth  today then one daily for 4 days.  Dispense: 6 tablet; Refill: 0 - albuterol (PROVENTIL HFA;VENTOLIN HFA) 108 (90 Base) MCG/ACT inhaler; Inhale 2 puffs into the lungs every 6 (six) hours as needed for wheezing or shortness of breath.  Dispense: 1 Inhaler; Refill: 0 - benzonatate (TESSALON) 200 MG capsule; Take 1 capsule (200 mg total) by mouth 2 (two) times daily as needed for cough.  Dispense: 20 capsule; Refill: [place], [place]  [facility]

## 2017-04-09 ENCOUNTER — Other Ambulatory Visit: Payer: Self-pay | Admitting: Family Medicine

## 2017-04-09 DIAGNOSIS — J069 Acute upper respiratory infection, unspecified: Secondary | ICD-10-CM

## 2017-04-09 MED ORDER — BENZONATATE 200 MG PO CAPS: 200.0000 mg | ORAL_CAPSULE | Freq: Two times a day (BID) | ORAL | 0 refills | Status: DC | PRN

## 2017-04-09 NOTE — Telephone Encounter (Signed)
Pt contacted office for refill request on the following medications:  benzonatate (TESSALON) 200 MG capsule  [facility] [address]  CB#[id number]/MW

## 2017-04-23 ENCOUNTER — Other Ambulatory Visit: Payer: Self-pay | Admitting: Family Medicine

## 2017-04-23 DIAGNOSIS — K2100 Gastro-esophageal reflux disease with esophagitis, without bleeding: Secondary | ICD-10-CM

## 2017-04-23 DIAGNOSIS — I10 Essential (primary) hypertension: Secondary | ICD-10-CM

## 2017-04-23 DIAGNOSIS — E119 Type 2 diabetes mellitus without complications: Secondary | ICD-10-CM

## 2017-05-09 ENCOUNTER — Other Ambulatory Visit: Payer: Self-pay | Admitting: Family Medicine

## 2017-05-09 DIAGNOSIS — E559 Vitamin D deficiency, unspecified: Secondary | ICD-10-CM

## 2017-05-11 ENCOUNTER — Encounter: Payer: Self-pay | Admitting: Family Medicine

## 2017-05-11 ENCOUNTER — Ambulatory Visit (INDEPENDENT_AMBULATORY_CARE_PROVIDER_SITE_OTHER): Payer: Medicare HMO | Admitting: Family Medicine

## 2017-05-11 VITALS — BP 116/70 | HR 80 | Temp 98.5°F | Resp 15 | Wt 190.2 lb

## 2017-05-11 DIAGNOSIS — R140 Abdominal distension (gaseous): Secondary | ICD-10-CM

## 2017-05-11 DIAGNOSIS — R600 Localized edema: Secondary | ICD-10-CM | POA: Diagnosis not present

## 2017-05-11 MED ORDER — FUROSEMIDE 20 MG PO TABS: 20.0000 mg | ORAL_TABLET | Freq: Every day | ORAL | 0 refills | Status: DC

## 2017-05-11 NOTE — Progress Notes (Signed)
Subjective:     Patient ID: Jennifer [name], female   DOB: 1940-06-08, 77 y.o.   MRN: [id number] Chief Complaint  Patient presents with  . Bloated    Patient comes in ofifce today with concerns of abdominal distension for 3 weeks.  . Joint Swelling    Patient reports swelling in her ankles for over 3 weeks.    HPI States swelling of her belly and ankles started about the same time. Denies s.o.b.or constipation ("metformin gives me diarrhea") Reports she has not been exercising regularly. Weight decreased 2# since o.v.of 03/30/18. Hx of NASH/cirrhosis.  Review of Systems     Objective:   Physical Exam  Constitutional: She appears well-developed and well-nourished. No distress.  Cardiovascular: Normal rate and regular rhythm.  Pulmonary/Chest: Breath sounds normal. She has no wheezes.  Abdominal: Soft. She exhibits distension. There is tenderness (mild in RUQ). There is no guarding.  Musculoskeletal: She exhibits edema (1+ at ankles).       Assessment:    1. Abdominal bloating - Comprehensive metabolic panel  2.  Pedal edema - B Nat Peptide - furosemide (LASIX) 20 MG tablet; Take 1 tablet (20 mg total) by mouth daily.  Dispense: 14 tablet; Refill: 0    Plan:   Further f/u pending lab work. Start potassium while on fluid pill.

## 2017-05-11 NOTE — Patient Instructions (Signed)
We will call you with the lab results. Start potassium daily while on the fluid pill.

## 2017-05-12 LAB — COMPREHENSIVE METABOLIC PANEL
A/G RATIO: 1.2 (ref 1.2–2.2)
ALBUMIN: 3.6 g/dL (ref 3.5–4.8)
ALT: 13 IU/L (ref 0–32)
AST: 30 IU/L (ref 0–40)
Alkaline Phosphatase: 70 IU/L (ref 39–117)
BILIRUBIN TOTAL: 0.9 mg/dL (ref 0.0–1.2)
BUN / CREAT RATIO: 17 (ref 12–28)
BUN: 10 mg/dL (ref 8–27)
CHLORIDE: 100 mmol/L (ref 96–106)
CO2: 26 mmol/L (ref 20–29)
Calcium: 9.3 mg/dL (ref 8.7–10.3)
Creatinine, Ser: 0.58 mg/dL (ref 0.57–1.00)
GFR calc Af Amer: 103 mL/min/{1.73_m2} (ref >59)
GFR calc non Af Amer: 89 mL/min/{1.73_m2} (ref >59)
GLUCOSE: 118 mg/dL — AB (ref 65–99)
Globulin, Total: 3.1 g/dL (ref 1.5–4.5)
POTASSIUM: 4.2 mmol/L (ref 3.5–5.2)
Sodium: 140 mmol/L (ref 134–144)
Total Protein: 6.7 g/dL (ref 6.0–8.5)

## 2017-05-12 LAB — BRAIN NATRIURETIC PEPTIDE: BNP: 16.3 pg/mL (ref 0.0–100.0)

## 2017-05-14 ENCOUNTER — Telehealth: Payer: Self-pay

## 2017-05-14 ENCOUNTER — Other Ambulatory Visit: Payer: Self-pay | Admitting: Family Medicine

## 2017-05-14 DIAGNOSIS — E119 Type 2 diabetes mellitus without complications: Secondary | ICD-10-CM

## 2017-05-14 NOTE — Telephone Encounter (Signed)
-----   Message from Carmon Ginsberg, [place] sent at 05/14/2017  7:36 AM EST ----- Kidney function is good and no evidence of heart failure. Do f/u with Simona Huh if fluid pills not helping your symptoms.

## 2017-05-14 NOTE — Telephone Encounter (Signed)
Patient advised.KW 

## 2017-05-21 ENCOUNTER — Other Ambulatory Visit: Payer: Self-pay | Admitting: Family Medicine

## 2017-05-21 ENCOUNTER — Telehealth: Payer: Self-pay | Admitting: Family Medicine

## 2017-05-21 DIAGNOSIS — R600 Localized edema: Secondary | ICD-10-CM

## 2017-05-21 MED ORDER — FUROSEMIDE 20 MG PO TABS: 20.0000 mg | ORAL_TABLET | Freq: Every day | ORAL | 0 refills | Status: DC

## 2017-05-21 NOTE — Telephone Encounter (Signed)
Will refill the Furosemide and should schedule follow up appointment to assess progress. Need to be sure potassium level is normal, also.

## 2017-05-21 NOTE — Telephone Encounter (Signed)
Please review. KW 

## 2017-05-21 NOTE — Telephone Encounter (Signed)
See below message. KW

## 2017-05-21 NOTE — Telephone Encounter (Signed)
Pt contacted office for refill request on the following medications:  furosemide (LASIX) 20 MG tablet  [facility] [address]  Pt stated that she saw Mikki Santee for OV on 05/11/17 and has been taking furosemide (LASIX) 20 MG tablet for swelling in both legs. Pt stated that the swelling has improved but not gone and request to continue the medication. Please advise. Thanks TNP

## 2017-05-21 NOTE — Telephone Encounter (Signed)
See refill request.

## 2017-05-21 NOTE — Telephone Encounter (Signed)
Patient advised. Follow up scheduled for 05/25/17

## 2017-05-25 ENCOUNTER — Encounter: Payer: Self-pay | Admitting: Family Medicine

## 2017-05-25 ENCOUNTER — Ambulatory Visit (INDEPENDENT_AMBULATORY_CARE_PROVIDER_SITE_OTHER): Payer: Medicare HMO | Admitting: Family Medicine

## 2017-05-25 VITALS — BP 110/68 | HR 80 | Temp 98.0°F | Wt 185.4 lb

## 2017-05-25 DIAGNOSIS — R609 Edema, unspecified: Secondary | ICD-10-CM

## 2017-05-25 NOTE — Progress Notes (Signed)
Patient: Jennifer [name] Female    DOB: August 01, 1940   77 y.o.   MRN: [id number] Visit Date: 05/25/2017  Today's Provider: Vernie Murders, PA    Chief Complaint  Patient presents with  . Leg Swelling  . Follow-up   Subjective:    HPI Leg Edema: Patient presents today for a follow up. Last OV was on 05/11/17. Patient seen Mikki Santee. She was given a RX for Lasix 20 mg. Patient contacted office on 05/21/16 to request a refill on Lasix. Refill was sent to pharmacy. Patient was advised to follow up to recheck edema, and recheck labs to be sure Potassium levels are stable. Patient states the leg swelling has significantly improved with the Lasix. She takes the medication daily.  Past Medical History:  Diagnosis Date  . Allergy   . Arthritis   . Asthma   . Colon polyp   . Diabetes mellitus without complication ([place]) 4270  . Fatty liver   . GERD (gastroesophageal reflux disease)   . Hyperlipidemia   . Hypertension   . Mild nonproliferative diabetic retinopathy of both eyes ([place])   . Motion sickness    boats   Past Surgical History:  Procedure Laterality Date  . ABDOMINAL HYSTERECTOMY  1978  . COLONOSCOPY WITH PROPOFOL N/A 02/01/2015   Procedure: COLONOSCOPY WITH PROPOFOL;  Surgeon: Lucilla Lame, MD;  Location: [place];  Service: Endoscopy;  Laterality: N/A;  Diabetic - oral meds  . POLYPECTOMY  02/01/2015   Procedure: POLYPECTOMY;  Surgeon: Lucilla Lame, MD;  Location: [place];  Service: Endoscopy;;  . TONSILLECTOMY AND ADENOIDECTOMY     Family History  Problem Relation Age of Onset  . Dementia Mother   . Alcohol abuse Father   . Cancer Sister        lung  . Lymphoma Sister   . Lymphoma Sister    Allergies  Allergen Reactions  . Celecoxib Other (See Comments)    Hearing loss while taking  . Codeine Other (See Comments)    Pt denies  . Hydrocodone-Acetaminophen Other (See Comments)    Pt denies  . Nitrofurantoin Other (See Comments)  . Nitrofurantoin  Monohyd Macro     "Sugar got high"  . Penicillins   . Sulfa Antibiotics Other (See Comments)    Current Outpatient Medications:  .  albuterol (PROVENTIL HFA;VENTOLIN HFA) 108 (90 Base) MCG/ACT inhaler, Inhale 2 puffs into the lungs every 6 (six) hours as needed for wheezing or shortness of breath., Disp: 1 Inhaler, Rfl: 0 .  Alcohol Swabs (B-D SINGLE USE SWABS REGULAR) PADS, , Disp: , Rfl:  .  Baclofen 5 MG TABS, Take 5 mg by mouth 3 (three) times daily as needed. Three times daily for muscle spasms, Disp: 30 tablet, Rfl: 0 .  Blood Glucose Calibration (ACCU-CHEK SMARTVIEW CONTROL) LIQD, , Disp: , Rfl:  .  Blood Glucose Monitoring Suppl (ACCU-CHEK AVIVA PLUS) w/Device KIT, TEST FASTING BLOOD SUGAR ONCE A DAY., Disp: 1 kit, Rfl: 0 .  Blood Glucose Monitoring Suppl (ACCU-CHEK AVIVA) device, Use as instructed, Disp: 1 each, Rfl: 0 .  furosemide (LASIX) 20 MG tablet, Take 1 tablet (20 mg total) by mouth daily., Disp: 30 tablet, Rfl: 0 .  glucose blood (ACCU-CHEK AVIVA) test strip, Check sugar twice daily DX E 11.9, Disp: 100 each, Rfl: 3 .  ibuprofen (ADVIL,MOTRIN) 600 MG tablet, Take 1 tablet (600 mg total) by mouth every 8 (eight) hours as needed., Disp: 30 tablet, Rfl: 0 .  IRON  PO, Take 45 mg by mouth., Disp: , Rfl:  .  Lancets Misc. (ACCU-CHEK FASTCLIX LANCET) KIT, Test fasting blood sugar daily., Disp: 100 kit, Rfl: 3 .  metFORMIN (GLUCOPHAGE) 500 MG tablet, TAKE 1 TABLET TWICE DAILY WITH MEALS, Disp: 180 tablet, Rfl: 1 .  MILK THISTLE PO, Take by mouth., Disp: , Rfl:  .  omeprazole (PRILOSEC) 20 MG capsule, TAKE 1 CAPSULE TWICE DAILY, Disp: 180 capsule, Rfl: 1 .  Potassium 99 MG TABS, Take by mouth as needed. , Disp: , Rfl:  .  quinapril (ACCUPRIL) 10 MG tablet, TAKE 1 TABLET EVERY DAY, Disp: 90 tablet, Rfl: 1 .  TURMERIC CURCUMIN PO, Take by mouth., Disp: , Rfl:  .  Vitamin D, Ergocalciferol, (DRISDOL) 50000 units CAPS capsule, TAKE 1 CAPSULE EVERY WEEK, Disp: 12 capsule, Rfl: 1  Review  of Systems  Constitutional: Negative.   Respiratory: Negative.   Cardiovascular: Positive for leg swelling.    Social History   Tobacco Use  . Smoking status: Former Smoker    Packs/day: 0.50    Years: 6.00    Pack years: 3.00    Types: Cigarettes    Last attempt to quit: 05/08/1987    Years since quitting: 30.0  . Smokeless tobacco: Never Used  Substance Use Topics  . Alcohol use: No   Objective:   BP 110/68 (BP Location: Right Arm, Patient Position: Sitting, Cuff Size: Normal)   Pulse 80   Temp 98 F (36.7 C) (Oral)   Wt 185 lb 6.4 oz (84.1 kg)   SpO2 97%   BMI 35.03 kg/m  Wt Readings from Last 3 Encounters:  05/25/17 185 lb 6.4 oz (84.1 kg)  05/11/17 190 lb 3.2 oz (86.3 kg)  03/30/17 192 lb 3.2 oz (87.2 kg)    Physical Exam  Constitutional: She is oriented to person, place, and time. She appears well-developed and well-nourished. No distress.  HENT:  Head: Normocephalic and atraumatic.  Right Ear: Hearing normal.  Left Ear: Hearing normal.  Nose: Nose normal.  Eyes: Conjunctivae and lids are normal. Right eye exhibits no discharge. Left eye exhibits no discharge. No scleral icterus.  Pulmonary/Chest: Effort normal. No respiratory distress.  Musculoskeletal: She exhibits edema.  1+ pitting both lower legs above ankles. Good pulses bilaterally. No pain or redness.  Neurological: She is alert and oriented to person, place, and time.  Skin: Skin is intact. No lesion and no rash noted.  Psychiatric: She has a normal mood and affect. Her speech is normal and behavior is normal. Thought content normal.      Assessment & Plan:     1. Peripheral edema Feeling well without dyspnea or palpitations. Good response to use of Lasix prn. No recent muscle cramps or weakness. Has lost 5 lbs since starting the Lasix for edema. Will recheck potassium level and encouraged to use of Lasix prn with elevation of legs and support hose. Recheck pending lab report. - Potassium        Vernie Murders, PA  [facility]

## 2017-05-26 LAB — POTASSIUM: POTASSIUM: 3.7 mmol/L (ref 3.5–5.2)

## 2017-05-29 ENCOUNTER — Encounter: Payer: Self-pay | Admitting: Family Medicine

## 2017-07-12 ENCOUNTER — Telehealth: Payer: Self-pay

## 2017-07-12 ENCOUNTER — Ambulatory Visit: Admit: 2017-07-12 | Payer: Medicare HMO | Admitting: Gastroenterology

## 2017-07-12 ENCOUNTER — Inpatient Hospital Stay: Payer: Medicare HMO | Admitting: Anesthesiology

## 2017-07-12 ENCOUNTER — Ambulatory Visit: Payer: Medicare HMO | Admitting: Family Medicine

## 2017-07-12 ENCOUNTER — Inpatient Hospital Stay
Admission: EM | Admit: 2017-07-12 | Discharge: 2017-07-15 | DRG: 368 | Disposition: A | Discharge status: Home / Self Care | Payer: Medicare HMO | Attending: Internal Medicine | Admitting: Internal Medicine

## 2017-07-12 ENCOUNTER — Other Ambulatory Visit: Payer: Self-pay

## 2017-07-12 ENCOUNTER — Inpatient Hospital Stay: Payer: Medicare HMO

## 2017-07-12 ENCOUNTER — Encounter
Admission: EM | Disposition: A | Discharge status: Home / Self Care | Payer: Self-pay | Source: Home / Self Care | Attending: Internal Medicine

## 2017-07-12 DIAGNOSIS — Z87891 Personal history of nicotine dependence: Secondary | ICD-10-CM

## 2017-07-12 DIAGNOSIS — K7469 Other cirrhosis of liver: Secondary | ICD-10-CM

## 2017-07-12 DIAGNOSIS — I10 Essential (primary) hypertension: Secondary | ICD-10-CM | POA: Diagnosis present

## 2017-07-12 DIAGNOSIS — R791 Abnormal coagulation profile: Secondary | ICD-10-CM | POA: Diagnosis present

## 2017-07-12 DIAGNOSIS — K766 Portal hypertension: Secondary | ICD-10-CM | POA: Diagnosis not present

## 2017-07-12 DIAGNOSIS — J449 Chronic obstructive pulmonary disease, unspecified: Secondary | ICD-10-CM | POA: Diagnosis present

## 2017-07-12 DIAGNOSIS — Z86010 Personal history of colonic polyps: Secondary | ICD-10-CM

## 2017-07-12 DIAGNOSIS — D61818 Other pancytopenia: Secondary | ICD-10-CM | POA: Diagnosis present

## 2017-07-12 DIAGNOSIS — R140 Abdominal distension (gaseous): Secondary | ICD-10-CM

## 2017-07-12 DIAGNOSIS — I8510 Secondary esophageal varices without bleeding: Secondary | ICD-10-CM | POA: Diagnosis not present

## 2017-07-12 DIAGNOSIS — Z888 Allergy status to other drugs, medicaments and biological substances status: Secondary | ICD-10-CM

## 2017-07-12 DIAGNOSIS — E785 Hyperlipidemia, unspecified: Secondary | ICD-10-CM | POA: Diagnosis present

## 2017-07-12 DIAGNOSIS — Z880 Allergy status to penicillin: Secondary | ICD-10-CM

## 2017-07-12 DIAGNOSIS — J9601 Acute respiratory failure with hypoxia: Secondary | ICD-10-CM | POA: Diagnosis present

## 2017-07-12 DIAGNOSIS — D649 Anemia, unspecified: Secondary | ICD-10-CM

## 2017-07-12 DIAGNOSIS — Z79899 Other long term (current) drug therapy: Secondary | ICD-10-CM

## 2017-07-12 DIAGNOSIS — K920 Hematemesis: Secondary | ICD-10-CM | POA: Diagnosis not present

## 2017-07-12 DIAGNOSIS — I8501 Esophageal varices with bleeding: Principal | ICD-10-CM | POA: Diagnosis present

## 2017-07-12 DIAGNOSIS — Z881 Allergy status to other antibiotic agents status: Secondary | ICD-10-CM

## 2017-07-12 DIAGNOSIS — J45909 Unspecified asthma, uncomplicated: Secondary | ICD-10-CM | POA: Diagnosis not present

## 2017-07-12 DIAGNOSIS — I8511 Secondary esophageal varices with bleeding: Secondary | ICD-10-CM | POA: Diagnosis not present

## 2017-07-12 DIAGNOSIS — D509 Iron deficiency anemia, unspecified: Secondary | ICD-10-CM | POA: Diagnosis not present

## 2017-07-12 DIAGNOSIS — E8770 Fluid overload, unspecified: Secondary | ICD-10-CM | POA: Diagnosis present

## 2017-07-12 DIAGNOSIS — R600 Localized edema: Secondary | ICD-10-CM

## 2017-07-12 DIAGNOSIS — E118 Type 2 diabetes mellitus with unspecified complications: Secondary | ICD-10-CM

## 2017-07-12 DIAGNOSIS — Z978 Presence of other specified devices: Secondary | ICD-10-CM | POA: Diagnosis not present

## 2017-07-12 DIAGNOSIS — R161 Splenomegaly, not elsewhere classified: Secondary | ICD-10-CM | POA: Diagnosis present

## 2017-07-12 DIAGNOSIS — K219 Gastro-esophageal reflux disease without esophagitis: Secondary | ICD-10-CM | POA: Diagnosis present

## 2017-07-12 DIAGNOSIS — Z90710 Acquired absence of both cervix and uterus: Secondary | ICD-10-CM

## 2017-07-12 DIAGNOSIS — K649 Unspecified hemorrhoids: Secondary | ICD-10-CM

## 2017-07-12 DIAGNOSIS — K922 Gastrointestinal hemorrhage, unspecified: Secondary | ICD-10-CM

## 2017-07-12 DIAGNOSIS — E113293 Type 2 diabetes mellitus with mild nonproliferative diabetic retinopathy without macular edema, bilateral: Secondary | ICD-10-CM | POA: Diagnosis not present

## 2017-07-12 DIAGNOSIS — I8500 Esophageal varices without bleeding: Secondary | ICD-10-CM | POA: Diagnosis not present

## 2017-07-12 DIAGNOSIS — J309 Allergic rhinitis, unspecified: Secondary | ICD-10-CM | POA: Diagnosis present

## 2017-07-12 DIAGNOSIS — K3189 Other diseases of stomach and duodenum: Secondary | ICD-10-CM

## 2017-07-12 DIAGNOSIS — R188 Other ascites: Secondary | ICD-10-CM | POA: Diagnosis present

## 2017-07-12 DIAGNOSIS — D62 Acute posthemorrhagic anemia: Secondary | ICD-10-CM | POA: Diagnosis present

## 2017-07-12 DIAGNOSIS — K625 Hemorrhage of anus and rectum: Secondary | ICD-10-CM | POA: Diagnosis present

## 2017-07-12 DIAGNOSIS — E119 Type 2 diabetes mellitus without complications: Secondary | ICD-10-CM | POA: Diagnosis not present

## 2017-07-12 DIAGNOSIS — K7581 Nonalcoholic steatohepatitis (NASH): Secondary | ICD-10-CM

## 2017-07-12 DIAGNOSIS — K7460 Unspecified cirrhosis of liver: Secondary | ICD-10-CM

## 2017-07-12 DIAGNOSIS — J9600 Acute respiratory failure, unspecified whether with hypoxia or hypercapnia: Secondary | ICD-10-CM | POA: Diagnosis not present

## 2017-07-12 DIAGNOSIS — R918 Other nonspecific abnormal finding of lung field: Secondary | ICD-10-CM | POA: Diagnosis not present

## 2017-07-12 DIAGNOSIS — K644 Residual hemorrhoidal skin tags: Secondary | ICD-10-CM | POA: Diagnosis not present

## 2017-07-12 DIAGNOSIS — Z7984 Long term (current) use of oral hypoglycemic drugs: Secondary | ICD-10-CM

## 2017-07-12 DIAGNOSIS — Z885 Allergy status to narcotic agent status: Secondary | ICD-10-CM

## 2017-07-12 HISTORY — PX: ESOPHAGOGASTRODUODENOSCOPY (EGD) WITH PROPOFOL: SHX5813

## 2017-07-12 HISTORY — DX: Gastrointestinal hemorrhage, unspecified: K92.2

## 2017-07-12 LAB — ABO/RH: ABO/RH(D): O POS

## 2017-07-12 LAB — CBC
HCT: 26.5 % — ABNORMAL LOW (ref 35.0–47.0)
HCT: 23.1 % — ABNORMAL LOW (ref 35.0–47.0)
HCT: 20.3 % — ABNORMAL LOW (ref 35.0–47.0)
HEMOGLOBIN: 8.9 g/dL — AB (ref 12.0–16.0)
HEMOGLOBIN: 7.5 g/dL — AB (ref 12.0–16.0)
Hemoglobin: 6.8 g/dL — ABNORMAL LOW (ref 12.0–16.0)
MCH: 29.4 pg (ref 26.0–34.0)
MCH: 29 pg (ref 26.0–34.0)
MCH: 29.5 pg (ref 26.0–34.0)
MCHC: 33.6 g/dL (ref 32.0–36.0)
MCHC: 32.6 g/dL (ref 32.0–36.0)
MCHC: 33.7 g/dL (ref 32.0–36.0)
MCV: 87.6 fL (ref 80.0–100.0)
MCV: 88.9 fL (ref 80.0–100.0)
MCV: 87.6 fL (ref 80.0–100.0)
Platelets: 146 10*3/uL — ABNORMAL LOW (ref 150–440)
Platelets: 89 10*3/uL — ABNORMAL LOW (ref 150–440)
Platelets: 72 K/uL — ABNORMAL LOW (ref 150–440)
RBC: 3.02 MIL/uL — AB (ref 3.80–5.20)
RBC: 2.6 MIL/uL — ABNORMAL LOW (ref 3.80–5.20)
RBC: 2.32 MIL/uL — ABNORMAL LOW (ref 3.80–5.20)
RDW: 14.4 % (ref 11.5–14.5)
RDW: 14.7 % — ABNORMAL HIGH (ref 11.5–14.5)
RDW: 14.6 % — ABNORMAL HIGH (ref 11.5–14.5)
WBC: 6.3 10*3/uL (ref 3.6–11.0)
WBC: 3.9 10*3/uL (ref 3.6–11.0)
WBC: 2 K/uL — ABNORMAL LOW (ref 3.6–11.0)

## 2017-07-12 LAB — COMPREHENSIVE METABOLIC PANEL
ALBUMIN: 2.9 g/dL — AB (ref 3.5–5.0)
ALK PHOS: 65 U/L (ref 38–126)
ALT: 15 U/L (ref 14–54)
ANION GAP: 9 (ref 5–15)
AST: 32 U/L (ref 15–41)
BUN: 21 mg/dL — ABNORMAL HIGH (ref 6–20)
CALCIUM: 8.3 mg/dL — AB (ref 8.9–10.3)
CHLORIDE: 102 mmol/L (ref 101–111)
CO2: 24 mmol/L (ref 22–32)
CREATININE: 0.65 mg/dL (ref 0.44–1.00)
GFR calc non Af Amer: >60 mL/min (ref >60)
GLUCOSE: 157 mg/dL — AB (ref 65–99)
Potassium: 3.7 mmol/L (ref 3.5–5.1)
SODIUM: 135 mmol/L (ref 135–145)
Total Bilirubin: 1.4 mg/dL — ABNORMAL HIGH (ref 0.3–1.2)
Total Protein: 6.2 g/dL — ABNORMAL LOW (ref 6.5–8.1)

## 2017-07-12 LAB — MRSA PCR SCREENING: MRSA by PCR: NEGATIVE

## 2017-07-12 LAB — TRIGLYCERIDES
Triglycerides: 106 mg/dL (ref <150)
Triglycerides: 109 mg/dL

## 2017-07-12 LAB — PROTIME-INR
INR: 1.32
PROTHROMBIN TIME: 16.3 s — AB (ref 11.4–15.2)

## 2017-07-12 LAB — PREPARE RBC (CROSSMATCH)

## 2017-07-12 LAB — GLUCOSE, CAPILLARY: GLUCOSE-CAPILLARY: 88 mg/dL (ref 65–99)

## 2017-07-12 SURGERY — ESOPHAGOGASTRODUODENOSCOPY (EGD) WITH PROPOFOL
Anesthesia: General

## 2017-07-12 SURGERY — EGD (ESOPHAGOGASTRODUODENOSCOPY)
Anesthesia: General

## 2017-07-12 MED ORDER — PROPOFOL 1000 MG/100ML IV EMUL
0.0000 ug/kg/min | INTRAVENOUS | Status: DC
  Administered 2017-07-12 (×2): 30 ug/kg/min via INTRAVENOUS
  Administered 2017-07-13 (×2): 40 ug/kg/min via INTRAVENOUS
  Filled 2017-07-12 (×3): qty 100

## 2017-07-12 MED ORDER — LIDOCAINE HCL (CARDIAC) 20 MG/ML IV SOLN
INTRAVENOUS | Status: DC | PRN
  Administered 2017-07-12: 60 mg via INTRAVENOUS

## 2017-07-12 MED ORDER — SODIUM CHLORIDE 0.9 % IV SOLN
8.0000 mg/h | INTRAVENOUS | Status: DC
  Filled 2017-07-12: qty 80

## 2017-07-12 MED ORDER — SUCCINYLCHOLINE CHLORIDE 20 MG/ML IJ SOLN
INTRAMUSCULAR | Status: DC | PRN
  Administered 2017-07-12: 100 mg via INTRAVENOUS

## 2017-07-12 MED ORDER — PROPOFOL 10 MG/ML IV BOLUS
INTRAVENOUS | Status: DC | PRN
  Administered 2017-07-12: 160 mg via INTRAVENOUS

## 2017-07-12 MED ORDER — PHENYLEPHRINE HCL 10 MG/ML IJ SOLN
INTRAMUSCULAR | Status: DC | PRN
  Administered 2017-07-12 (×2): 100 ug via INTRAVENOUS
  Administered 2017-07-12 (×4): 200 ug via INTRAVENOUS
  Administered 2017-07-12: 150 ug via INTRAVENOUS
  Administered 2017-07-12: 100 ug via INTRAVENOUS

## 2017-07-12 MED ORDER — DEXAMETHASONE SODIUM PHOSPHATE 10 MG/ML IJ SOLN
INTRAMUSCULAR | Status: AC
  Filled 2017-07-12: qty 1

## 2017-07-12 MED ORDER — ORAL CARE MOUTH RINSE
15.0000 mL | Freq: Four times a day (QID) | OROMUCOSAL | Status: DC
  Administered 2017-07-13 (×3): 15 mL via OROMUCOSAL

## 2017-07-12 MED ORDER — LACTATED RINGERS IV SOLN
INTRAVENOUS | Status: DC
  Administered 2017-07-12 – 2017-07-14 (×5): via INTRAVENOUS

## 2017-07-12 MED ORDER — ONDANSETRON HCL 4 MG/2ML IJ SOLN
INTRAMUSCULAR | Status: AC
  Filled 2017-07-12: qty 2

## 2017-07-12 MED ORDER — MIDAZOLAM HCL 2 MG/2ML IJ SOLN
INTRAMUSCULAR | Status: AC
  Filled 2017-07-12: qty 2

## 2017-07-12 MED ORDER — FENTANYL CITRATE (PF) 100 MCG/2ML IJ SOLN
25.0000 ug | INTRAMUSCULAR | Status: DC | PRN
  Filled 2017-07-12: qty 2

## 2017-07-12 MED ORDER — PANTOPRAZOLE SODIUM 40 MG IV SOLR
40.0000 mg | Freq: Once | INTRAVENOUS | Status: AC
  Administered 2017-07-12: 40 mg via INTRAVENOUS
  Filled 2017-07-12: qty 40

## 2017-07-12 MED ORDER — ALBUTEROL SULFATE (2.5 MG/3ML) 0.083% IN NEBU: 2.5000 mg | INHALATION_SOLUTION | Freq: Four times a day (QID) | RESPIRATORY_TRACT | Status: DC | PRN

## 2017-07-12 MED ORDER — PROPOFOL 500 MG/50ML IV EMUL
INTRAVENOUS | Status: AC
  Filled 2017-07-12: qty 50

## 2017-07-12 MED ORDER — PROPOFOL 1000 MG/100ML IV EMUL: 5.0000 ug/kg/min | INTRAVENOUS | Status: DC

## 2017-07-12 MED ORDER — ONDANSETRON HCL 4 MG PO TABS: 4.0000 mg | ORAL_TABLET | Freq: Four times a day (QID) | ORAL | Status: DC | PRN

## 2017-07-12 MED ORDER — PANTOPRAZOLE SODIUM 40 MG IV SOLR: 40.0000 mg | INTRAVENOUS | Status: DC

## 2017-07-12 MED ORDER — LIDOCAINE HCL (PF) 2 % IJ SOLN
INTRAMUSCULAR | Status: AC
  Filled 2017-07-12: qty 10

## 2017-07-12 MED ORDER — SUCCINYLCHOLINE CHLORIDE 20 MG/ML IJ SOLN
INTRAMUSCULAR | Status: AC
  Filled 2017-07-12: qty 1

## 2017-07-12 MED ORDER — ALBUTEROL SULFATE HFA 108 (90 BASE) MCG/ACT IN AERS: 2.0000 | INHALATION_SPRAY | Freq: Four times a day (QID) | RESPIRATORY_TRACT | Status: DC | PRN

## 2017-07-12 MED ORDER — FUROSEMIDE 10 MG/ML IJ SOLN
20.0000 mg | Freq: Once | INTRAMUSCULAR | Status: AC
  Administered 2017-07-13: 20 mg via INTRAVENOUS
  Filled 2017-07-12: qty 2

## 2017-07-12 MED ORDER — ONDANSETRON HCL 4 MG/2ML IJ SOLN: 4.0000 mg | Freq: Four times a day (QID) | INTRAMUSCULAR | Status: DC | PRN

## 2017-07-12 MED ORDER — SODIUM CHLORIDE 0.9 % IV SOLN
Freq: Once | INTRAVENOUS | Status: AC
  Administered 2017-07-13: 01:00:00 via INTRAVENOUS

## 2017-07-12 MED ORDER — CEFTRIAXONE SODIUM 1 G IJ SOLR
1.0000 g | INTRAMUSCULAR | Status: DC
  Filled 2017-07-12: qty 10

## 2017-07-12 MED ORDER — FENTANYL CITRATE (PF) 100 MCG/2ML IJ SOLN: 50.0000 ug | INTRAMUSCULAR | Status: DC | PRN

## 2017-07-12 MED ORDER — ONDANSETRON HCL 4 MG/2ML IJ SOLN: 4.0000 mg | Freq: Once | INTRAMUSCULAR | Status: DC | PRN

## 2017-07-12 MED ORDER — PROPOFOL 500 MG/50ML IV EMUL
INTRAVENOUS | Status: DC | PRN
  Administered 2017-07-12: 50 ug/kg/min via INTRAVENOUS

## 2017-07-12 MED ORDER — PANTOPRAZOLE SODIUM 40 MG IV SOLR
40.0000 mg | Freq: Two times a day (BID) | INTRAVENOUS | Status: DC
  Administered 2017-07-12 – 2017-07-15 (×6): 40 mg via INTRAVENOUS
  Filled 2017-07-12 (×6): qty 40

## 2017-07-12 MED ORDER — MIDAZOLAM HCL 5 MG/5ML IJ SOLN
INTRAMUSCULAR | Status: DC | PRN
  Administered 2017-07-12 (×2): 2 mg via INTRAVENOUS

## 2017-07-12 MED ORDER — FENTANYL CITRATE (PF) 100 MCG/2ML IJ SOLN
50.0000 ug | INTRAMUSCULAR | Status: DC | PRN
  Administered 2017-07-12: 25 ug via INTRAVENOUS

## 2017-07-12 MED ORDER — SODIUM CHLORIDE 0.9 % IV SOLN: 10.0000 mL/h | Freq: Once | INTRAVENOUS | Status: DC

## 2017-07-12 MED ORDER — ROCURONIUM BROMIDE 50 MG/5ML IV SOLN
INTRAVENOUS | Status: AC
  Filled 2017-07-12: qty 1

## 2017-07-12 MED ORDER — SODIUM CHLORIDE 0.9 % IV SOLN
INTRAVENOUS | Status: DC
  Administered 2017-07-12: 15:00:00 via INTRAVENOUS

## 2017-07-12 MED ORDER — ONDANSETRON HCL 4 MG/2ML IJ SOLN
INTRAMUSCULAR | Status: DC | PRN
  Administered 2017-07-12: 4 mg via INTRAVENOUS

## 2017-07-12 MED ORDER — OCTREOTIDE LOAD VIA INFUSION
50.0000 ug | Freq: Once | INTRAVENOUS | Status: AC
  Administered 2017-07-12: 50 ug via INTRAVENOUS
  Filled 2017-07-12: qty 25

## 2017-07-12 MED ORDER — SODIUM CHLORIDE 0.9 % IV BOLUS (SEPSIS)
1000.0000 mL | Freq: Once | INTRAVENOUS | Status: AC
  Administered 2017-07-12: 1000 mL via INTRAVENOUS

## 2017-07-12 MED ORDER — PROPOFOL 1000 MG/100ML IV EMUL
INTRAVENOUS | Status: AC
  Administered 2017-07-12: 30 ug/kg/min via INTRAVENOUS
  Filled 2017-07-12: qty 100

## 2017-07-12 MED ORDER — FENTANYL CITRATE (PF) 100 MCG/2ML IJ SOLN
INTRAMUSCULAR | Status: DC | PRN
  Administered 2017-07-12: 50 ug via INTRAVENOUS
  Administered 2017-07-12 (×2): 25 ug via INTRAVENOUS

## 2017-07-12 MED ORDER — FENTANYL CITRATE (PF) 100 MCG/2ML IJ SOLN
INTRAMUSCULAR | Status: AC
  Filled 2017-07-12: qty 2

## 2017-07-12 MED ORDER — CHLORHEXIDINE GLUCONATE 0.12% ORAL RINSE (MEDLINE KIT)
15.0000 mL | Freq: Two times a day (BID) | OROMUCOSAL | Status: DC
  Administered 2017-07-12 – 2017-07-13 (×2): 15 mL via OROMUCOSAL

## 2017-07-12 MED ORDER — SODIUM CHLORIDE 0.9 % IV SOLN
1.0000 g | Freq: Once | INTRAVENOUS | Status: AC
  Administered 2017-07-12: 1 g via INTRAVENOUS
  Filled 2017-07-12: qty 10

## 2017-07-12 MED ORDER — EPINEPHRINE PF 1 MG/10ML IJ SOSY
PREFILLED_SYRINGE | INTRAMUSCULAR | Status: AC
  Filled 2017-07-12: qty 10

## 2017-07-12 MED ORDER — INSULIN ASPART 100 UNIT/ML ~~LOC~~ SOLN: 0.0000 [IU] | Freq: Three times a day (TID) | SUBCUTANEOUS | Status: DC

## 2017-07-12 MED ORDER — ROCURONIUM BROMIDE 100 MG/10ML IV SOLN
INTRAVENOUS | Status: DC | PRN
  Administered 2017-07-12: 30 mg via INTRAVENOUS
  Administered 2017-07-12: 20 mg via INTRAVENOUS

## 2017-07-12 MED ORDER — OCTREOTIDE ACETATE 500 MCG/ML IJ SOLN
50.0000 ug/h | INTRAMUSCULAR | Status: DC
  Administered 2017-07-12 – 2017-07-14 (×5): 50 ug/h via INTRAVENOUS
  Filled 2017-07-12 (×12): qty 1

## 2017-07-12 MED ORDER — DEXAMETHASONE SODIUM PHOSPHATE 10 MG/ML IJ SOLN
INTRAMUSCULAR | Status: DC | PRN
  Administered 2017-07-12: 5 mg via INTRAVENOUS

## 2017-07-12 NOTE — ED Notes (Addendum)
Dr. Marius Ditch at bedside

## 2017-07-12 NOTE — Transfer of Care (Signed)
Immediate Anesthesia Transfer of Care Note  Patient: Jennifer [name]  Procedure(s) Performed: ESOPHAGOGASTRODUODENOSCOPY (EGD) WITH PROPOFOL (N/A )  Patient Location: ICU  Anesthesia Type:General  Level of Consciousness: Patient remains intubated per anesthesia plan  Airway & Oxygen Therapy: Patient remains intubated per anesthesia plan  Post-op Assessment: Report given to RN and Post -op Vital signs reviewed and stable  Post vital signs: Reviewed  Last Vitals:  Vitals:   07/12/17 1519 07/12/17 1800  BP: 112/62 (!) 110/55  Pulse: 95 89  Resp: 18 15  Temp: 37.1 C   SpO2: 99% 99%    Last Pain:  Vitals:   07/12/17 1519  TempSrc: Tympanic  PainSc:          Complications: No apparent anesthesia complications

## 2017-07-12 NOTE — ED Notes (Signed)
Pt to endo at this time.

## 2017-07-12 NOTE — Progress Notes (Signed)
Notified Maggie NP of HgB results. New orders placed.

## 2017-07-12 NOTE — H&P (Signed)
[facility] NAME: Oanh Devivo    MR#:  [id number]  DATE OF BIRTH:  1940/07/19  DATE OF ADMISSION:  07/12/2017  PRIMARY CARE PHYSICIAN: Chrismon, Vickki Muff, PA   REQUESTING/REFERRING PHYSICIAN: Joni Fears  Chief complaint: Hematemesis   Chief Complaint  Patient presents with  . Hematemesis  . Blood In Stools    HISTORY OF PRESENT ILLNESS:  Miaisabella Bacorn  is a 77 y.o. female with a known history of fatty liver, diabetes mellitus type 2 came in because of hematemesis and also rectal bleeding.  Patient told me that started to have hematemesis early this morning when the after that she noted rectal bleeding when she went to the bathroom.  Patient denies any chest pain or shortness of breath does not have any dizziness.  She told me that she has history of fatty liver and taking herbal supplements with turmeric and also milk thistle.  Never had EGD.  Now she has ascites.  Hemoglobin 8.9.  Last hemoglobin was in September 2018 it was a 12.1 INR today is 1.3.  PAST MEDICAL HISTORY:   Past Medical History:  Diagnosis Date  . Allergy   . Arthritis   . Asthma   . Colon polyp   . Diabetes mellitus without complication ([place]) 4158  . Fatty liver   . GERD (gastroesophageal reflux disease)   . Hyperlipidemia   . Hypertension   . Mild nonproliferative diabetic retinopathy of both eyes ([place])   . Motion sickness    boats    PAST SURGICAL HISTOIRY:   Past Surgical History:  Procedure Laterality Date  . ABDOMINAL HYSTERECTOMY  1978  . COLONOSCOPY WITH PROPOFOL N/A 02/01/2015   Procedure: COLONOSCOPY WITH PROPOFOL;  Surgeon: Lucilla Lame, MD;  Location: [facility];  Service: Endoscopy;  Laterality: N/A;  Diabetic - oral meds  . POLYPECTOMY  02/01/2015   Procedure: POLYPECTOMY;  Surgeon: Lucilla Lame, MD;  Location: [facility];  Service: Endoscopy;;  . TONSILLECTOMY AND ADENOIDECTOMY      SOCIAL HISTORY:   Social  History   Tobacco Use  . Smoking status: Former Smoker    Packs/day: 0.50    Years: 6.00    Pack years: 3.00    Types: Cigarettes    Last attempt to quit: 05/08/1987    Years since quitting: 30.2  . Smokeless tobacco: Never Used  Substance Use Topics  . Alcohol use: No    FAMILY HISTORY:   Family History  Problem Relation Age of Onset  . Dementia Mother   . Alcohol abuse Father   . Cancer Sister        lung  . Lymphoma Sister   . Lymphoma Sister     DRUG ALLERGIES:   Allergies  Allergen Reactions  . Celecoxib Other (See Comments)    Hearing loss while taking  . Codeine Other (See Comments)    Pt denies  . Hydrocodone-Acetaminophen Other (See Comments)    Pt denies  . Nitrofurantoin Other (See Comments)  . Nitrofurantoin Monohyd Macro     "Sugar got high"  . Penicillins     Has patient had a PCN reaction causing immediate rash, facial/tongue/throat swelling, SOB or lightheadedness with hypotension: Unknown Has patient had a PCN reaction causing severe rash involving mucus membranes or skin necrosis: Unknown Has patient had a PCN reaction that required hospitalization: Unknown Has patient had a PCN reaction occurring within the last 10 years: Unknown If all of  the above answers are "NO", then may proceed with Cephalosporin use.  . Sulfa Antibiotics Other (See Comments)    REVIEW OF SYSTEMS:  CONSTITUTIONAL: No fever, fatigue or weakness.  EYES: No blurred or double vision.  EARS, NOSE, AND THROAT: No tinnitus or ear pain.  RESPIRATORY: No cough, shortness of breath, wheezing or hemoptysis.  CARDIOVASCULAR: No chest pain, patient says that she sleeps with 2 pillows.  Denies orthopnea does have pedal edema.  Patient denies shortness of breath GASTROINTESTINAL: No nausea, patient had hematemesis, rectal bleeding today morning.  No abdominal pain.  GENITOURINARY: No dysuria, hematuria.  ENDOCRINE: No polyuria, nocturia,  HEMATOLOGY: No anemia, easy bruising or  bleeding SKIN: No rash or lesion. MUSCULOSKELETAL: No joint pain or arthritis.   NEUROLOGIC: No tingling, numbness, weakness.  PSYCHIATRY: No anxiety or depression.   MEDICATIONS AT HOME:   Prior to Admission medications   Medication Sig Start Date End Date Taking? Authorizing Provider  furosemide (LASIX) 20 MG tablet Take 1 tablet (20 mg total) by mouth daily. 05/21/17  Yes Chrismon, Vickki Muff, PA  IRON PO Take 45 mg by mouth.   Yes [provider]  metFORMIN (GLUCOPHAGE) 500 MG tablet TAKE 1 TABLET TWICE DAILY WITH MEALS Patient taking differently: TAKE 1 TABLET  DAILY WITH MEALS 04/23/17  Yes Chrismon, Vickki Muff, PA  omeprazole (PRILOSEC) 20 MG capsule TAKE 1 CAPSULE TWICE DAILY 04/23/17  Yes Chrismon, Vickki Muff, PA  Potassium 99 MG TABS Take by mouth as needed.    Yes [provider]  quinapril (ACCUPRIL) 10 MG tablet TAKE 1 TABLET EVERY DAY 04/23/17  Yes Chrismon, Vickki Muff, PA  TURMERIC CURCUMIN PO Take by mouth.   Yes [provider]  Vitamin D, Ergocalciferol, (DRISDOL) 50000 units CAPS capsule TAKE 1 CAPSULE EVERY WEEK 05/10/17  Yes Chrismon, Vickki Muff, PA  albuterol (PROVENTIL HFA;VENTOLIN HFA) 108 (90 Base) MCG/ACT inhaler Inhale 2 puffs into the lungs every 6 (six) hours as needed for wheezing or shortness of breath. 03/30/17   Chrismon, Vickki Muff, PA  Baclofen 5 MG TABS Take 5 mg by mouth 3 (three) times daily as needed. Three times daily for muscle spasms Patient not taking: Reported on 07/12/2017 02/09/17   Chrismon, Vickki Muff, PA  Blood Glucose Monitoring Suppl (ACCU-CHEK AVIVA PLUS) w/Device KIT TEST FASTING BLOOD SUGAR ONCE A DAY. 05/14/17   Chrismon, Vickki Muff, PA  Blood Glucose Monitoring Suppl (ACCU-CHEK AVIVA) device Use as instructed 01/02/17 01/02/18  Chrismon, Vickki Muff, PA  glucose blood (ACCU-CHEK AVIVA) test strip Check sugar twice daily DX E 11.9 01/02/17   Chrismon, Dennis E, PA  ibuprofen (ADVIL,MOTRIN) 600 MG tablet Take 1 tablet (600 mg total) by mouth  every 8 (eight) hours as needed. 01/16/17   Chrismon, Vickki Muff, PA  Lancets Misc. (ACCU-CHEK FASTCLIX LANCET) KIT Test fasting blood sugar daily. 12/05/15   Chrismon, Vickki Muff, PA      VITAL SIGNS:  Blood pressure 109/64, pulse (!) 112, temperature 99.1 F (37.3 C), temperature source Oral, resp. rate 18, height _0  (1.575 m), weight 84.4 kg (186 lb), SpO2 97 %.  PHYSICAL EXAMINATION:  GENERAL:  77 y.o.-year-old patient lying in the bed with no acute distress.  EYES: Pupils equal, round, reactive to light and accommodation. No scleral icterus. Extraocular muscles intact.  HEENT: Head atraumatic, normocephalic. Oropharynx and nasopharynx clear.  NECK:  Supple, no jugular venous distention. No thyroid enlargement, no tenderness.  LUNGS: Normal breath sounds bilaterally, no wheezing, rales,rhonchi or crepitation.  No use of accessory muscles of respiration.  CARDIOVASCULAR: S1, S2 normal. No murmurs, rubs, or gallops.  ABDOMEN: Distended with, ascites has fluid thrill eXTREMITIES: slight pedal edema present NEUROLOGIC: Cranial nerves II through XII are intact. Muscle strength 5/5 in all extremities. Sensation intact. Gait not checked.  PSYCHIATRIC: The patient is alert and oriented x 3.  SKIN: No obvious rash, lesion, or ulcer.   LABORATORY PANEL:   CBC Recent Labs  Lab 07/12/17 1145  WBC 6.3  HGB 8.9*  HCT 26.5*  PLT 146*   ------------------------------------------------------------------------------------------------------------------  Chemistries  Recent Labs  Lab 07/12/17 1145  NA 135  K 3.7  CL 102  CO2 24  GLUCOSE 157*  BUN 21*  CREATININE 0.65  CALCIUM 8.3*  AST 32  ALT 15  ALKPHOS 65  BILITOT 1.4*   ------------------------------------------------------------------------------------------------------------------  Cardiac Enzymes No results for input(s): TROPONINI in the last 168  hours. ------------------------------------------------------------------------------------------------------------------  RADIOLOGY:  No results found.  EKG:   Orders placed or performed during the hospital encounter of 07/12/17  . EKG 12-Lead  . EKG 12-Lead    IMPRESSION AND PLAN:   This 77 year old female patient with history of liver cirrhosis with [name] comes in with hematemesis, rectal bleed, concerning for rapid upper GI bleed. #1 upper GI bleed: Patient never had EGD.  Also not aware of cirrhosis.  Patient had ultrasound of abdomen last year which showed cirrhosis, splenomegaly.  Talk with Dr. Marius Ditch, patient will be admitted to hospital, start on IV Protonix, octreotide drip, ordered sono-guided paracentesis, continue empiric Rocephin, continue n.p.o. for possible EGD later today, monitor hemoglobins. 2.  Diabetes mellitus type 2: N.p.o., continue sliding scale with coverage.  Hold oral diabetics. 3.  COPD without any wheezing.  Continue albuterol inhalers. 4.  Anemia, thrombocytopenia, elevated INR all are consistent with liver disease. #5 essential hypertension: Continue to monitor closely, hold Lasix because of GI bleed at this time.  All the records are reviewed and case discussed with ED provider. Management plans discussed with the patient, family and they are in agreement.  CODE STATUS: full  TOTAL TIME TAKING CARE OF THIS PATIENT:  45(CriticalCAre)   Epifanio Lesches M.D on 07/12/2017 at 1:54 PM  Between 7am to 6pm - Pager - [phone]  After 6pm go to [email or web address] - password [email or web address]  Office  [phone]  CC: Primary care physician; Margo Common, PA  Note: This dictation was prepared with Dragon dictation along with smaller phrase technology. Any transcriptional errors that result from this process are unintentional.

## 2017-07-12 NOTE — Progress Notes (Addendum)
Noted to have esophageal varices, patient had to be intubated for the procedure, 1 of the esophageal versus got ruptured that had to be placed on 4 bands, Dr.Vanga recommends overnight ICU admission, intubation, continue ICU monitoring overnight, continue octreotide drip, can change the Protonix drip to Protonix 40 every 12.   transferOrders placed for ICU, spoke with Dr. Rosita Fire.

## 2017-07-12 NOTE — Anesthesia Postprocedure Evaluation (Deleted)
Anesthesia Post Note  Patient: Jennifer [name]  Procedure(s) Performed: ESOPHAGOGASTRODUODENOSCOPY (EGD) WITH PROPOFOL (N/A )  Patient location during evaluation: ICU Anesthesia Type: General Level of consciousness: sedated Pain management: pain level controlled Vital Signs Assessment: post-procedure vital signs reviewed and stable Respiratory status: patient on ventilator - see flowsheet for VS Cardiovascular status: stable Postop Assessment: no apparent nausea or vomiting Anesthetic complications: no     Last Vitals:  Vitals:   07/12/17 1939 07/12/17 1945  BP:  106/63  Pulse:  79  Resp:  14  Temp:    SpO2: 100% 100%    Last Pain:  Vitals:   07/12/17 1800  TempSrc: Axillary  PainSc:                  KEPHART,WILLIAM K

## 2017-07-12 NOTE — Anesthesia Post-op Follow-up Note (Signed)
Anesthesia QCDR form completed.        

## 2017-07-12 NOTE — Consult Note (Signed)
PULMONARY / CRITICAL CARE MEDICINE   Name: Jennifer [name] MRN: [id number] DOB: 11/25/1940    ADMISSION DATE:  07/12/2017 CONSULTATION DATE:  03/07  PT PROFILE: 77 y.o. F presented to [facility] ED on day of admission with hematemesis and rectal bleeding.  Went from ED to endoscopy for EGD.  Was found to have esophageal varices.  Required intubation for procedure.  Left intubated post procedure because of high risk of rebleeding.   HISTORY OF PRESENT ILLNESS:   Level 5 caveat  PAST MEDICAL HISTORY :  She  has a past medical history of Allergy, Arthritis, Asthma, Colon polyp, Diabetes mellitus without complication ([place]) (9476), Fatty liver, GERD (gastroesophageal reflux disease), Hyperlipidemia, Hypertension, Mild nonproliferative diabetic retinopathy of both eyes ([place]), and Motion sickness.  PAST SURGICAL HISTORY: She  has a past surgical history that includes Tonsillectomy and adenoidectomy; Abdominal hysterectomy (1978); Colonoscopy with propofol (N/A, 02/01/2015); and polypectomy (02/01/2015).  Allergies  Allergen Reactions  . Celecoxib Other (See Comments)    Hearing loss while taking  . Codeine Other (See Comments)    Pt denies  . Hydrocodone-Acetaminophen Other (See Comments)    Pt denies  . Nitrofurantoin Other (See Comments)  . Nitrofurantoin Monohyd Macro     "Sugar got high"  . Penicillins     Has patient had a PCN reaction causing immediate rash, facial/tongue/throat swelling, SOB or lightheadedness with hypotension: Unknown Has patient had a PCN reaction causing severe rash involving mucus membranes or skin necrosis: Unknown Has patient had a PCN reaction that required hospitalization: Unknown Has patient had a PCN reaction occurring within the last 10 years: Unknown If all of the above answers are "NO", then may proceed with Cephalosporin use.  . Sulfa Antibiotics Other (See Comments)    No current facility-administered medications on file prior to encounter.    Current  Outpatient Medications on File Prior to Encounter  Medication Sig  . furosemide (LASIX) 20 MG tablet Take 1 tablet (20 mg total) by mouth daily.  . IRON PO Take 45 mg by mouth.  . metFORMIN (GLUCOPHAGE) 500 MG tablet TAKE 1 TABLET TWICE DAILY WITH MEALS (Patient taking differently: TAKE 1 TABLET  DAILY WITH MEALS)  . omeprazole (PRILOSEC) 20 MG capsule TAKE 1 CAPSULE TWICE DAILY  . Potassium 99 MG TABS Take by mouth as needed.   . quinapril (ACCUPRIL) 10 MG tablet TAKE 1 TABLET EVERY DAY  . TURMERIC CURCUMIN PO Take by mouth.  . Vitamin D, Ergocalciferol, (DRISDOL) 50000 units CAPS capsule TAKE 1 CAPSULE EVERY WEEK  . albuterol (PROVENTIL HFA;VENTOLIN HFA) 108 (90 Base) MCG/ACT inhaler Inhale 2 puffs into the lungs every 6 (six) hours as needed for wheezing or shortness of breath.  . Baclofen 5 MG TABS Take 5 mg by mouth 3 (three) times daily as needed. Three times daily for muscle spasms (Patient not taking: Reported on 07/12/2017)  . Blood Glucose Monitoring Suppl (ACCU-CHEK AVIVA PLUS) w/Device KIT TEST FASTING BLOOD SUGAR ONCE A DAY.  Marland Kitchen Blood Glucose Monitoring Suppl (ACCU-CHEK AVIVA) device Use as instructed  . glucose blood (ACCU-CHEK AVIVA) test strip Check sugar twice daily DX E 11.9  . ibuprofen (ADVIL,MOTRIN) 600 MG tablet Take 1 tablet (600 mg total) by mouth every 8 (eight) hours as needed.  . Lancets Misc. (ACCU-CHEK FASTCLIX LANCET) KIT Test fasting blood sugar daily.    FAMILY HISTORY:  Her indicated that her mother is deceased. She indicated that her father is deceased. She indicated that both of her sisters are  alive. She indicated that all of her three brothers are deceased.   SOCIAL HISTORY: She  reports that she quit smoking about 30 years ago. Her smoking use included cigarettes. She has a 3.00 pack-year smoking history. she has never used smokeless tobacco. She reports that she does not drink alcohol or use drugs.  REVIEW OF SYSTEMS:   Level 5 caveat  SUBJECTIVE:     VITAL SIGNS: BP (!) 110/55   Pulse 89   Temp (!) 97.2 F (36.2 C) (Axillary)   Resp 15   Ht 5' 2" (1.575 m)   Wt 84.4 kg (186 lb)   SpO2 99%   BMI 34.02 kg/m   HEMODYNAMICS:    VENTILATOR SETTINGS: Vent Mode: PRVC FiO2 (%):  [35 %] 35 % Set Rate:  [14 bmp] 14 bmp Vt Set:  [500 mL] 500 mL PEEP:  [5 cmH20] 5 cmH20  INTAKE / OUTPUT: No intake/output data recorded.  PHYSICAL EXAMINATION: General: Intubated, sedated Neuro: Cranial nerves intact, withdraws all extremities, DTRs symmetric HEENT: NCAT, sclerae white Cardiovascular: Regular, no M Lungs: Clear to auscultation and percussion anteriorly Abdomen: Obese, soft, bowel sounds present Extremities: Warm, diminished pulses, no edema Skin: no lesions noted  LABS:  BMET Recent Labs  Lab 07/12/17 1145  NA 135  K 3.7  CL 102  CO2 24  BUN 21*  CREATININE 0.65  GLUCOSE 157*    Electrolytes Recent Labs  Lab 07/12/17 1145  CALCIUM 8.3*    CBC Recent Labs  Lab 07/12/17 1145  WBC 6.3  HGB 8.9*  HCT 26.5*  PLT 146*    Coag's Recent Labs  Lab 07/12/17 1145  INR 1.32    Sepsis Markers No results for input(s): LATICACIDVEN, PROCALCITON, O2SATVEN in the last 168 hours.  ABG No results for input(s): PHART, PCO2ART, PO2ART in the last 168 hours.  Liver Enzymes Recent Labs  Lab 07/12/17 1145  AST 32  ALT 15  ALKPHOS 65  BILITOT 1.4*  ALBUMIN 2.9*    Cardiac Enzymes No results for input(s): TROPONINI, PROBNP in the last 168 hours.  Glucose Recent Labs  Lab 07/12/17 1523  GLUCAP 88    CXR: Pending   ASSESSMENT / PLAN: 1) acute respiratory failure -intubated for airway protection during EGD.  Left intubated due to high risk of rebleeding 2) acute esophageal variceal bleed  3) acute blood loss anemia 4) type 2 diabetes  Vent settings established Vent bundle implemented WUA/SBT in AM - likely extubation if no rebleeding Monitor BMET intermittently Monitor I/Os Correct  electrolytes as indicated  IVFs adjusted DVT px: SCDs Monitor CBC intermittently Transfuse per usual guidelines - keep Hgb > 8.0 until UGIB is fully resolved SSI adjusted Ceftriaxone for SBP prophylaxis in setting of variceal bleed   Merton Border, MD PCCM service Mobile [phone] Pager [phone] 07/12/2017 6:35 PM

## 2017-07-12 NOTE — Anesthesia Preprocedure Evaluation (Signed)
Anesthesia Evaluation  Patient identified by MRN, date of birth, ID band Patient awake    Reviewed: Allergy & Precautions, NPO status , Patient's Chart, lab work & pertinent test results  History of Anesthesia Complications Negative for: history of anesthetic complications  Airway Mallampati: II       Dental   Pulmonary asthma , neg sleep apnea, neg COPD, former smoker,           Cardiovascular hypertension, Pt. on medications (-) Past MI and (-) CHF (-) dysrhythmias (-) Valvular Problems/Murmurs     Neuro/Psych neg Seizures    GI/Hepatic GERD  ,(+) Cirrhosis       , Hepatitis -  Endo/Other  diabetes, Type 2, Oral Hypoglycemic Agents  Renal/GU      Musculoskeletal   Abdominal   Peds  Hematology   Anesthesia Other Findings   Reproductive/Obstetrics                             Anesthesia Physical Anesthesia Plan  ASA: III  Anesthesia Plan: General   Post-op Pain Management:    Induction: Intravenous  PONV Risk Score and Plan: 3 and Ondansetron, Dexamethasone and Midazolam  Airway Management Planned: Oral ETT  Additional Equipment:   Intra-op Plan:   Post-operative Plan:   Informed Consent: I have reviewed the patients History and Physical, chart, labs and discussed the procedure including the risks, benefits and alternatives for the proposed anesthesia with the patient or authorized representative who has indicated his/her understanding and acceptance.     Plan Discussed with:   Anesthesia Plan Comments:         Anesthesia Quick Evaluation

## 2017-07-12 NOTE — Anesthesia Procedure Notes (Signed)
Procedure Name: Intubation Date/Time: 07/12/2017 3:53 PM Performed by: Hedda Slade, CRNA Pre-anesthesia Checklist: Patient identified, Patient being monitored, Timeout performed, Emergency Drugs available and Suction available Patient Re-evaluated:Patient Re-evaluated prior to induction Oxygen Delivery Method: Circle system utilized Preoxygenation: Pre-oxygenation with 100% oxygen Induction Type: IV induction, Rapid sequence and Cricoid Pressure applied Laryngoscope Size: Mac and 3 Grade View: Grade I Tube type: Oral Tube size: 7.0 mm Number of attempts: 1 Airway Equipment and Method: Stylet Placement Confirmation: ETT inserted through vocal cords under direct vision,  positive ETCO2 and breath sounds checked- equal and bilateral Secured at: 21 cm Tube secured with: Tape Dental Injury: Teeth and Oropharynx as per pre-operative assessment

## 2017-07-12 NOTE — ED Provider Notes (Signed)
[facility] Emergency Department Provider Note  ____________________________________________  Time seen: Approximately 12:20 PM  I have reviewed the triage vital signs and the nursing notes.   HISTORY  Chief Complaint Hematemesis and Blood In Stools    HPI Jennifer [name] is a 77 y.o. female who complains of hematemesis that started this morning as well as rectal bleeding since last night. She reports that 2 weeks ago she had rectal bleeding for a full day it then seemed to resolve on its own.  In the interim, she has been feeling okay, but since last night she started bleeding again. Denies dizziness or syncope. No chest pain or shortness of breath. No cough. No fever.  Does not take blood thinners, NSAIDs, steroids. Denies any history of GI bleeding.  She does have a history of [name] and cirrhosis. She feels that her belly has become increasingly distended lately   Past Medical History:  Diagnosis Date  . Allergy   . Arthritis   . Asthma   . Colon polyp   . Diabetes mellitus without complication ([place]) 0981  . Fatty liver   . GERD (gastroesophageal reflux disease)   . Hyperlipidemia   . Hypertension   . Mild nonproliferative diabetic retinopathy of both eyes ([place])   . Motion sickness    boats     Patient Active Problem List   Diagnosis Date Noted  . Acute right-sided low back pain 01/31/2017  . Carpal tunnel syndrome on right 09/28/2016  . Obesity (BMI 30.0-34.9) 09/20/2016  . Upper respiratory infection 03/17/2016  . Thrombocytopenia ([place]) 12/29/2015  . Primary osteoarthritis of left knee 09/30/2015  . Special screening for malignant neoplasms, colon   . Benign neoplasm of ascending colon   . Benign neoplasm of transverse colon   . Benign neoplasm of sigmoid colon   . Arthritis 10/21/2014  . Allergic rhinitis 10/14/2014  . Airway hyperreactivity 10/14/2014  . Back ache 10/14/2014  . Bell palsy 10/14/2014  . Controlled diabetes mellitus  type II without complication ([place]) 19/14/7829  . Elevation of level of transaminase or lactic acid dehydrogenase (LDH) 10/14/2014  . Calculus of gallbladder 10/14/2014  . HLD (hyperlipidemia) 10/14/2014  . BP (high blood pressure) 10/14/2014  . Gonalgia 10/14/2014  . Benign neoplasm of soft tissues 10/14/2014  . NASH (nonalcoholic steatohepatitis) 10/14/2014  . Adiposity 10/14/2014  . Esophagitis, reflux 10/14/2014  . Enlargement of spleen 10/14/2014  . Change in blood platelet count 10/14/2014  . Avitaminosis D 10/14/2014     Past Surgical History:  Procedure Laterality Date  . ABDOMINAL HYSTERECTOMY  1978  . COLONOSCOPY WITH PROPOFOL N/A 02/01/2015   Procedure: COLONOSCOPY WITH PROPOFOL;  Surgeon: Lucilla Lame, MD;  Location: [place];  Service: Endoscopy;  Laterality: N/A;  Diabetic - oral meds  . POLYPECTOMY  02/01/2015   Procedure: POLYPECTOMY;  Surgeon: Lucilla Lame, MD;  Location: [place];  Service: Endoscopy;;  . TONSILLECTOMY AND ADENOIDECTOMY       Prior to Admission medications   Medication Sig Start Date End Date Taking? Authorizing Provider  albuterol (PROVENTIL HFA;VENTOLIN HFA) 108 (90 Base) MCG/ACT inhaler Inhale 2 puffs into the lungs every 6 (six) hours as needed for wheezing or shortness of breath. 03/30/17   Chrismon, Vickki Muff, PA  Alcohol Swabs (B-D SINGLE USE SWABS REGULAR) PADS  07/24/14   [provider]  Baclofen 5 MG TABS Take 5 mg by mouth 3 (three) times daily as needed. Three times daily for muscle spasms 02/09/17  Chrismon, Vickki Muff, PA  Blood Glucose Calibration (ACCU-CHEK SMARTVIEW CONTROL) LIQD  07/24/14   [provider]  Blood Glucose Monitoring Suppl (ACCU-CHEK AVIVA PLUS) w/Device KIT TEST FASTING BLOOD SUGAR ONCE A DAY. 05/14/17   Chrismon, Vickki Muff, PA  Blood Glucose Monitoring Suppl (ACCU-CHEK AVIVA) device Use as instructed 01/02/17 01/02/18  Chrismon, Vickki Muff, PA  furosemide (LASIX) 20 MG tablet Take 1 tablet  (20 mg total) by mouth daily. 05/21/17   Chrismon, Vickki Muff, PA  glucose blood (ACCU-CHEK AVIVA) test strip Check sugar twice daily DX E 11.9 01/02/17   Chrismon, Simona Huh E, PA  ibuprofen (ADVIL,MOTRIN) 600 MG tablet Take 1 tablet (600 mg total) by mouth every 8 (eight) hours as needed. 01/16/17   Chrismon, Vickki Muff, PA  IRON PO Take 45 mg by mouth.    [provider]  Lancets Misc. (ACCU-CHEK FASTCLIX LANCET) KIT Test fasting blood sugar daily. 12/05/15   Chrismon, Vickki Muff, PA  metFORMIN (GLUCOPHAGE) 500 MG tablet TAKE 1 TABLET TWICE DAILY WITH MEALS 04/23/17   Chrismon, Simona Huh E, PA  MILK THISTLE PO Take by mouth.    [provider]  omeprazole (PRILOSEC) 20 MG capsule TAKE 1 CAPSULE TWICE DAILY 04/23/17   Chrismon, Vickki Muff, PA  Potassium 99 MG TABS Take by mouth as needed.     [provider]  quinapril (ACCUPRIL) 10 MG tablet TAKE 1 TABLET EVERY DAY 04/23/17   Chrismon, Vickki Muff, PA  TURMERIC CURCUMIN PO Take by mouth.    [provider]  Vitamin D, Ergocalciferol, (DRISDOL) 50000 units CAPS capsule TAKE 1 CAPSULE EVERY WEEK 05/10/17   Chrismon, Vickki Muff, PA     Allergies Celecoxib; Codeine; Hydrocodone-acetaminophen; Nitrofurantoin; Nitrofurantoin monohyd macro; Penicillins; and Sulfa antibiotics   Family History  Problem Relation Age of Onset  . Dementia Mother   . Alcohol abuse Father   . Cancer Sister        lung  . Lymphoma Sister   . Lymphoma Sister     Social History Social History   Tobacco Use  . Smoking status: Former Smoker    Packs/day: 0.50    Years: 6.00    Pack years: 3.00    Types: Cigarettes    Last attempt to quit: 05/08/1987    Years since quitting: 30.2  . Smokeless tobacco: Never Used  Substance Use Topics  . Alcohol use: No  . Drug use: No    Review of Systems  Constitutional:   No fever or chills.  ENT:   No sore throat. No rhinorrhea. Cardiovascular:   No chest pain or syncope. Respiratory:   No dyspnea or  cough. Gastrointestinal:   Negative for abdominal pain, positive hematemesis  Musculoskeletal:   Negative for focal pain or swelling All other systems reviewed and are negative except as documented above in ROS and HPI.  ____________________________________________   PHYSICAL EXAM:  VITAL SIGNS: ED Triage Vitals  Enc Vitals Group     BP 07/12/17 1157 130/77     Pulse Rate 07/12/17 1157 (!) 115     Resp 07/12/17 1157 20     Temp 07/12/17 1157 99.1 F (37.3 C)     Temp Source 07/12/17 1157 Oral     SpO2 07/12/17 1157 98 %     Weight 07/12/17 1143 186 lb (84.4 kg)     Height 07/12/17 1143 5' 2" (1.575 m)     Head Circumference --      Peak Flow --  Pain Score 07/12/17 1143 0     Pain Loc --      Pain Edu? --      Excl. in [place]? --     Vital signs reviewed, nursing assessments reviewed.   Constitutional:   Alert and oriented. Not in distress. Eyes:   No scleral icterus.  EOMI. No nystagmus. No conjunctival pallor. PERRL. ENT   Head:   Normocephalic and atraumatic.   Nose:   No congestion/rhinnorhea.    Mouth/Throat:   MMM, no pharyngeal erythema. No peritonsillar mass.    Neck:   No meningismus. Full ROM. Hematological/Lymphatic/Immunilogical:   No cervical lymphadenopathy. Cardiovascular:   Tachycardia heart rate 120. Symmetric bilateral radial and DP pulses.  No murmurs.  Respiratory:   Normal respiratory effort without tachypnea/retractions. Breath sounds are clear and equal bilaterally. No wheezes/rales/rhonchi. Gastrointestinal:   Soft and nontender. Significantly distended. There is no CVA tenderness.  No rebound, rigidity, or guarding. Rectal exam performed with nurse Levada Dy at bedside. External hemorrhoids. There is fresh red blood in the rectum. Strongly Hemoccult positive. Genitourinary:   deferred Musculoskeletal:   Normal range of motion in all extremities. No joint effusions.  No lower extremity tenderness.  No edema. Neurologic:   Normal speech  and language.  Motor grossly intact. No acute focal neurologic deficits are appreciated.  Skin:    Skin is warm, dry and intact. No rash noted.  No petechiae, purpura, or bullae.  ____________________________________________    LABS (pertinent positives/negatives) (all labs ordered are listed, but only abnormal results are displayed) Labs Reviewed  CBC - Abnormal; Notable for the following components:      Result Value   RBC 3.02 (*)    Hemoglobin 8.9 (*)    HCT 26.5 (*)    Platelets 146 (*)    All other components within normal limits  COMPREHENSIVE METABOLIC PANEL  PROTIME-INR  POC OCCULT BLOOD, ED  TYPE AND SCREEN   ____________________________________________   EKG  Interpreted by me Sinus tachycardia rate 114, normal axis, prolonged QTC of 584 ms. Normal QRS ST segments and T waves.  ____________________________________________    RADIOLOGY  No results found.  ____________________________________________   PROCEDURES .Critical Care Performed by: Carrie Mew, MD Authorized by: Carrie Mew, MD   Critical care provider statement:    Critical care time (minutes):  35   Critical care time was exclusive of:  Separately billable procedures and treating other patients   Critical care was necessary to treat or prevent imminent or life-threatening deterioration of the following conditions:  Circulatory failure and shock   Critical care was time spent personally by me on the following activities:  Development of treatment plan with patient or surrogate, discussions with consultants, evaluation of patient's response to treatment, examination of patient, obtaining history from patient or surrogate, ordering and performing treatments and interventions, ordering and review of laboratory studies, ordering and review of radiographic studies, pulse oximetry, re-evaluation of patient's condition and review of old  charts    ____________________________________________    CLINICAL IMPRESSION / [place] / ED COURSE  Pertinent labs & imaging results that were available during my care of the patient were reviewed by me and considered in my medical decision making (see chart for details).     Clinical Course as of Jul 13 1218  Thu Jul 12, 2017  1210 Patient presents with hematemesis as well as rectal bleeding. No blood thinners prednisone or NSAID use. Does have a history of [name] and obvious ascites.  Tachycardic on arrival, indicative of stage II shock. Start PPI, octreotide, IV fluid for volume resuscitation, ceftriaxone. We'll need to admit for GI evaluation and hemodynamic monitoring.  [PS]    Clinical Course User Index [PS] Carrie Mew, MD     ----------------------------------------- 12:33 PM on 07/12/2017 -----------------------------------------  Hemoglobin 8.9, down from a baseline of 12 recently. With her tachycardia and acute upper and lower GI bleeding, concerned that she has hemorrhagic shock and worsening hemodynamic dysfunction. I'll order red blood cell transfusion for hemodynamic support.  ____________________________________________   FINAL CLINICAL IMPRESSION(S) / ED DIAGNOSES    Final diagnoses:  Acute upper GI bleed  Hemorrhoids, unspecified hemorrhoid type  Acute anemia     ED Discharge Orders    None      Portions of this note were generated with dragon dictation software. Dictation errors may occur despite best attempts at proofreading.    Carrie Mew, MD 07/12/17 1233

## 2017-07-12 NOTE — Telephone Encounter (Signed)
I saw pt was scheduled at 11:00 today for vomiting blood last night and rectal bleeding 2 weeks ago.I called pt to triage this. She denies abdominal pain or fever. States she had a feeling of needing to burp last night, and could not make herself belch. She drank a Coke, and went to sleep. She woke up around 1:00 am this morning vomiting "great big blood clots", and she was pale. She states she also had ractal bleeding 2 weeks ago, and passed "the great big blood clots". Advised pt to go ER. She agrees.

## 2017-07-12 NOTE — ED Triage Notes (Signed)
Pt states that 2 weeks ago she started with blood clots from her rectum (she did not seek medical attention) - this morning the pt reports that she started vomiting with large blood clots noted and then later this morning started with blood clots from rectum - denies pain

## 2017-07-12 NOTE — Consult Note (Signed)
Cephas Darby, MD [address]  [place]  [place], [place] [address]  Main: [phone]  Fax: [phone] Pager: [phone]   Consultation  Referring Provider:     No ref. provider found Primary Care Physician:  Margo Common, PA Primary Gastroenterologist:  Dr. Dr. Allen Norris         Reason for Consultation:     Hematemesis, cirrhosis  Date of Admission:  07/12/2017 Date of Consultation:  07/12/2017         HPI:   Jennifer [name] is a 77 y.o. female history of cirrhosis probably secondary to NASH, presents with one-day history of hematemesis, and 22month h/o abdominal distention, swelling of legs. She is closely followed by hematology for chronic thrombocytopenia. She presented to ER today as she had an episode of hematemesis last night, rectal bleeding 2 weeks ago. He had another episode of hematemesis around 1 AM this morning, reports that she vomited blood with clots. Her hemoglobin in the ER is 8.9. It was normal in September 2018 at 12.1. INR 1.32, , platelets 146. Her LFTs are mildly abnormal. Her last ultrasound in 06/2016 revealed cirrhosis, splenomegaly, no liver lesions. Hep B surface antigen is negative, HCV antibody negative in 2017, ferritin 22 in 07/2016. She does not have a regular gi f/u for cirrhosis, and not on diuretics. She denies drinking ETOH  NSAIDs: none  Antiplts/Anticoagulants/Anti thrombotics: none  GI Procedures:  Never had an EGD Colonoscopy 04/03/2005 normal Colonoscopy 02/01/2015 - One 8 mm polyp in the ascending colon. Resected and retrieved. Clips were placed. - One 4 mm polyp in the transverse colon. Resected and retrieved. - One 7 mm polyp in the sigmoid colon. Resected and retrieved. Clip was placed. - Non-bleeding internal hemorrhoids. 1. Colon, polyp(s), ascending colon polyp - SESSILE SERRATED POLYP. - NO DYSPLASIA OR MALIGNANCY. 2. Colon, polyp(s), transverse colon polyp - TUBULAR ADENOMA. - NO HIGH GRADE DYSPLASIA OR  MALIGNANCY. 3. Colon, polyp(s), sigmoid colon polyp - TUBULAR ADENOMA. - NO HIGH GRADE DYSPLASIA OR MALIGNANCY.  Past Medical History:  Diagnosis Date  . Allergy   . Arthritis   . Asthma   . Colon polyp   . Diabetes mellitus without complication ([place] [address] . Fatty liver   . GERD (gastroesophageal reflux disease)   . Hyperlipidemia   . Hypertension   . Mild nonproliferative diabetic retinopathy of both eyes ([place]   . Motion sickness    boats    Past Surgical History:  Procedure Laterality Date  . ABDOMINAL HYSTERECTOMY  1978  . COLONOSCOPY WITH PROPOFOL N/A 02/01/2015   Procedure: COLONOSCOPY WITH PROPOFOL;  Surgeon: DLucilla Lame MD;  Location[place]  Service: Endoscopy;  Laterality: N/A;  Diabetic - oral meds  . POLYPECTOMY  02/01/2015   Procedure: POLYPECTOMY;  Surgeon: DLucilla Lame MD;  [place] [facility]  Service: Endoscopy;;  . TONSILLECTOMY AND ADENOIDECTOMY      Prior to Admission medications   Medication Sig Start Date End Date Taking? Authorizing Provider  albuterol (PROVENTIL HFA;VENTOLIN HFA) 108 (90 Base) MCG/ACT inhaler Inhale 2 puffs into the lungs every 6 (six) hours as needed for wheezing or shortness of breath. 03/30/17   Chrismon, DVickki Muff PA  Alcohol Swabs (B-D SINGLE USE SWABS REGULAR) PADS  07/24/14   [provider]  Baclofen 5 MG TABS Take 5 mg by mouth 3 (three) times daily as needed. Three times daily for muscle spasms 02/09/17   Chrismon, DVickki Muff [place]  Blood Glucose Calibration (ACCU-CHEK SMARTVIEW CONTROL) LIQD  07/24/14   [provider]  Blood Glucose Monitoring Suppl (ACCU-CHEK AVIVA PLUS) w/Device KIT TEST FASTING BLOOD SUGAR ONCE A DAY. 05/14/17   Chrismon, Vickki Muff, PA  Blood Glucose Monitoring Suppl (ACCU-CHEK AVIVA) device Use as instructed 01/02/17 01/02/18  Chrismon, Vickki Muff, PA  furosemide (LASIX) 20 MG tablet Take 1 tablet (20 mg total) by mouth daily. 05/21/17   Chrismon, Vickki Muff, PA  glucose blood  (ACCU-CHEK AVIVA) test strip Check sugar twice daily DX E 11.9 01/02/17   Chrismon, Simona Huh E, PA  ibuprofen (ADVIL,MOTRIN) 600 MG tablet Take 1 tablet (600 mg total) by mouth every 8 (eight) hours as needed. 01/16/17   Chrismon, Vickki Muff, PA  IRON PO Take 45 mg by mouth.    [provider]  Lancets Misc. (ACCU-CHEK FASTCLIX LANCET) KIT Test fasting blood sugar daily. 12/05/15   Chrismon, Vickki Muff, PA  metFORMIN (GLUCOPHAGE) 500 MG tablet TAKE 1 TABLET TWICE DAILY WITH MEALS 04/23/17   Chrismon, Simona Huh E, PA  MILK THISTLE PO Take by mouth.    [provider]  omeprazole (PRILOSEC) 20 MG capsule TAKE 1 CAPSULE TWICE DAILY 04/23/17   Chrismon, Vickki Muff, PA  Potassium 99 MG TABS Take by mouth as needed.     [provider]  quinapril (ACCUPRIL) 10 MG tablet TAKE 1 TABLET EVERY DAY 04/23/17   Chrismon, Vickki Muff, PA  TURMERIC CURCUMIN PO Take by mouth.    [provider]  Vitamin D, Ergocalciferol, (DRISDOL) 50000 units CAPS capsule TAKE 1 CAPSULE EVERY WEEK 05/10/17   Chrismon, Vickki Muff, PA    Family History  Problem Relation Age of Onset  . Dementia Mother   . Alcohol abuse Father   . Cancer Sister        lung  . Lymphoma Sister   . Lymphoma Sister      Social History   Tobacco Use  . Smoking status: Former Smoker    Packs/day: 0.50    Years: 6.00    Pack years: 3.00    Types: Cigarettes    Last attempt to quit: 05/08/1987    Years since quitting: 30.2  . Smokeless tobacco: Never Used  Substance Use Topics  . Alcohol use: No  . Drug use: No    Allergies as of 07/12/2017 - Review Complete 07/12/2017  Allergen Reaction Noted  . Celecoxib Other (See Comments) 10/14/2014  . Codeine Other (See Comments) 10/14/2014  . Hydrocodone-acetaminophen Other (See Comments) 10/14/2014  . Nitrofurantoin Other (See Comments) 11/05/2014  . Nitrofurantoin monohyd macro  10/14/2014  . Penicillins  10/14/2014  . Sulfa antibiotics Other (See Comments) 10/14/2014     Review of Systems:    All systems reviewed and negative except where noted in HPI.   Physical Exam:  Vital signs in last 24 hours: Temp:  [99.1 F (37.3 C)] 99.1 F (37.3 C) (03/07 1157) Pulse Rate:  [112-115] 112 (03/07 1230) Resp:  [18-20] 18 (03/07 1230) BP: (109-130)/(64-77) 109/64 (03/07 1230) SpO2:  [97 %-98 %] 97 % (03/07 1230) Weight:  [186 lb (84.4 kg)] 186 lb (84.4 kg) (03/07 1143)   General:   Pleasant, cooperative in NAD Head:  Normocephalic and atraumatic. Eyes:   No icterus.   Conjunctiva pale. PERRLA. Ears:  Normal auditory acuity. Neck:  Supple; no masses or thyroidomegaly Lungs: Respirations even and unlabored. Lungs clear to auscultation bilaterally.   No wheezes, crackles, or rhonchi.  Heart:  Regular rate and  rhythm;  Without murmur, clicks, rubs or gallops Abdomen:  Soft, grossly distended, nontender. Normal bowel sounds. No appreciable masses or hepatomegaly.  No rebound or guarding.  Rectal:  Not performed. Msk:  Symmetrical without gross deformities.  Strength normal  Extremities:  Without edema, cyanosis or clubbing. Neurologic:  Alert and oriented x3;  grossly normal neurologically. Skin:  Intact without significant lesions or rashes. Cervical Nodes:  No significant cervical adenopathy. Psych:  Alert and cooperative. Normal affect.  LAB RESULTS: CBC Latest Ref Rng & Units 07/12/2017 01/17/2017 07/11/2016  WBC 3.6 - 11.0 K/uL 6.3 2.4(L) 3.3(L)  Hemoglobin 12.0 - 16.0 g/dL 8.9(L) 12.1 11.7  Hematocrit 35.0 - 47.0 % 26.5(L) 34.8(L) 36.4  Platelets 150 - 440 K/uL 146(L) 70(L) 74(LL)    BMET BMP Latest Ref Rng & Units 07/12/2017 05/25/2017 05/11/2017  Glucose 65 - 99 mg/dL 157(H) - 118(H)  BUN 6 - 20 mg/dL 21(H) - 10  Creatinine 0.44 - 1.00 mg/dL 0.65 - 0.58  BUN/Creat Ratio 12 - 28 - - 17  Sodium 135 - 145 mmol/L 135 - 140  Potassium 3.5 - 5.1 mmol/L 3.7 3.7 4.2  Chloride 101 - 111 mmol/L 102 - 100  CO2 22 - 32 mmol/L 24 - 26  Calcium 8.9 - 10.3 mg/dL  8.3(L) - 9.3    LFT Hepatic Function Latest Ref Rng & Units 07/12/2017 05/11/2017 01/17/2017  Total Protein 6.5 - 8.1 g/dL 6.2(L) 6.7 6.5  Albumin 3.5 - 5.0 g/dL 2.9(L) 3.6 3.4(L)  AST 15 - 41 U/L 32 30 37  ALT 14 - 54 U/L 15 13 24   Alk Phosphatase 38 - 126 U/L 65 70 61  Total Bilirubin 0.3 - 1.2 mg/dL 1.4(H) 0.9 1.5(H)     STUDIES: No results found.    Impression / Plan:   Jennifer [name] is a 77 y.o. female with known history of cirrhosis, portal hypertension manifested as splenomegaly and thrombocytopenia presents with 1 day history of hematemesis and rectal bleeding resulting in anemia. Differentials include variceal bleed or peptic ulcer disease or erosive esophagitis or Mallory-Weiss tear  Upper GI bleed: - Octreotide and pantoprazole drips - Ceftriaxone for SBP prophylaxis - Nothing by mouth - Closely monitor CBC, goal hemoglobin above 7, platelets above 50, INR less than 1.5 - EGD today  Decompensated cirrhosis: Probably secondary to fatty liver Hepatitis B surface antigen negative, HCV antibody negative Portal hypertension Volume overload: With ascites and swelling of legs Recommend ultrasound abdomen with Dopplers to assess need for therapeutic paracentesis and send fluid for analysis to rule out SBP Will need to start diuretics Anemia/thromboytopenia/coagulopathy: low iron Variceal screening: EGD as above HCC screening: Check ultrasound, AFP PSE: None  I have discussed alternative options, risks & benefits,  which include, but are not limited to, bleeding, infection, perforation,respiratory complication & drug reaction.  The patient agrees with this plan & written consent will be obtained.    Thank you for involving me in the care of this patient.      LOS: 0 days   Sherri Sear, MD  07/12/2017, 2:29 PM   Note: This dictation was prepared with Dragon dictation along with smaller phrase technology. Any transcriptional errors that result from this process are  unintentional.

## 2017-07-12 NOTE — Op Note (Signed)
[facility] Gastroenterology Patient Name: Jennifer [name] Procedure Date: 07/12/2017 3:45 PM MRN: [id number] Account #: [id number] Date of Birth: 08-28-1940 Admit Type: Outpatient Age: 77 Room: [facility] ENDO ROOM 1 Gender: Female Note Status: Finalized Procedure:            Upper GI endoscopy Indications:          Acute post hemorrhagic anemia, Hematemesis Providers:            Lin Landsman MD, MD Referring MD:         Vickki Muff. Chrismon, MD (Referring MD) Medicines:            General Anesthesia Complications:        No immediate complications. Estimated blood loss:                        Minimal. Procedure:            Pre-Anesthesia Assessment:                       - Prior to the procedure, a History and Physical was                        performed, and patient medications and allergies were                        reviewed. The patient is competent. The risks and                        benefits of the procedure and the sedation options and                        risks were discussed with the patient. All questions                        were answered and informed consent was obtained.                        Patient identification and proposed procedure were                        verified by the physician, the nurse, the                        anesthesiologist, the anesthetist and the technician in                        the pre-procedure area in the procedure room in the                        endoscopy suite. Mental Status Examination: alert and                        oriented. Airway Examination: normal oropharyngeal                        airway and neck mobility. Respiratory Examination:                        clear to auscultation. CV Examination: normal.  Prophylactic Antibiotics: The patient does not require                        prophylactic antibiotics. Prior Anticoagulants: The                        patient has taken no  previous anticoagulant or                        antiplatelet agents. ASA Grade Assessment: III - A                        patient with severe systemic disease. After reviewing                        the risks and benefits, the patient was deemed in                        satisfactory condition to undergo the procedure. The                        anesthesia plan was to use monitored anesthesia care                        (MAC). Immediately prior to administration of                        medications, the patient was re-assessed for adequacy                        to receive sedatives. The heart rate, respiratory rate,                        oxygen saturations, blood pressure, adequacy of                        pulmonary ventilation, and response to care were                        monitored throughout the procedure. The physical status                        of the patient was re-assessed after the procedure.                       After obtaining informed consent, the endoscope was                        passed under direct vision. Throughout the procedure,                        the patient's blood pressure, pulse, and oxygen                        saturations were monitored continuously. The Endoscope                        was introduced through the mouth, and advanced to the  second part of duodenum. The upper GI endoscopy was                        accomplished without difficulty. The patient tolerated                        the procedure well. Findings:      The duodenal bulb and second portion of the duodenum were normal.      Severe portal hypertensive gastropathy was found in the entire examined       stomach.      Four columns of large (> 5 mm) varices were found in the middle third of       the esophagus and in the lower third of the esophagus, 38 cm from the       incisors. Stigmata of recent bleeding were evident and red wale signs       were present.  One varix started bleeding during procedure. One band was       immediately deployed over the bleeding varix and bleeding stopped. Three       more bands were successfully placed but with incomplete eradication of       varices. Bleeding had stopped at the end of the procedure. Estimated       blood loss was minimal. Impression:           - Normal duodenal bulb and second portion of the                        duodenum.                       - Portal hypertensive gastropathy.                       - Bleeding large (> 5 mm) esophageal varices.                        Incompletely eradicated. Banded.                       - No specimens collected. Recommendation:       - Return patient to ICU for ongoing care.                       - Keep pt intubated overnight                       - Stat CBC                       - NPO.                       - Octreotide drip                       - Protonix BID                       - Continue present medications. Procedure Code(s):    --- Professional ---                       [phone], Esophagogastroduodenoscopy, flexible, transoral;  with band ligation of esophageal/gastric varices Diagnosis Code(s):    --- Professional ---                       K76.6, Portal hypertension                       K31.89, Other diseases of stomach and duodenum                       I85.01, Esophageal varices with bleeding                       D62, Acute posthemorrhagic anemia                       K92.0, Hematemesis CPT copyright 2016 American Medical Association. All rights reserved. The codes documented in this report are preliminary and upon coder review may  be revised to meet current compliance requirements. Dr. Ulyess Mort Lin Landsman MD, MD 07/12/2017 4:24:49 PM This report has been signed electronically. Number of Addenda: 0 Note Initiated On: 07/12/2017 3:45 PM      [facility])

## 2017-07-12 NOTE — ED Notes (Signed)
Dr. Joni Fears at bedside

## 2017-07-12 NOTE — ED Notes (Signed)
Spoke with Dr. Marius Ditch who states that she is going to take patient for EGD and to hold off on transfusing blood at this time and will recheck HGB after EGD.  Called lab and informed Lavella Lemons that we are holding off on blood transfusion at this time per Dr. Marius Ditch.

## 2017-07-13 ENCOUNTER — Inpatient Hospital Stay: Payer: Medicare HMO

## 2017-07-13 ENCOUNTER — Encounter: Payer: Self-pay | Admitting: Gastroenterology

## 2017-07-13 ENCOUNTER — Other Ambulatory Visit: Payer: Self-pay

## 2017-07-13 LAB — BASIC METABOLIC PANEL
Anion gap: 8 (ref 5–15)
BUN: 23 mg/dL — AB (ref 6–20)
CALCIUM: 7.8 mg/dL — AB (ref 8.9–10.3)
CO2: 22 mmol/L (ref 22–32)
Chloride: 107 mmol/L (ref 101–111)
Creatinine, Ser: 0.72 mg/dL (ref 0.44–1.00)
GFR calc Af Amer: >60 mL/min (ref >60)
GFR calc non Af Amer: >60 mL/min (ref >60)
GLUCOSE: 183 mg/dL — AB (ref 65–99)
Potassium: 4.2 mmol/L (ref 3.5–5.1)
Sodium: 137 mmol/L (ref 135–145)

## 2017-07-13 LAB — PATHOLOGIST SMEAR REVIEW

## 2017-07-13 LAB — CBC
HCT: 25.4 % — ABNORMAL LOW (ref 35.0–47.0)
HEMATOCRIT: 28.2 % — AB (ref 35.0–47.0)
Hemoglobin: 8.8 g/dL — ABNORMAL LOW (ref 12.0–16.0)
Hemoglobin: 9.7 g/dL — ABNORMAL LOW (ref 12.0–16.0)
MCH: 29.8 pg (ref 26.0–34.0)
MCH: 29.6 pg (ref 26.0–34.0)
MCHC: 34.7 g/dL (ref 32.0–36.0)
MCHC: 34.4 g/dL (ref 32.0–36.0)
MCV: 85.8 fL (ref 80.0–100.0)
MCV: 86.1 fL (ref 80.0–100.0)
PLATELETS: 76 10*3/uL — AB (ref 150–440)
PLATELETS: 95 10*3/uL — AB (ref 150–440)
RBC: 2.96 MIL/uL — AB (ref 3.80–5.20)
RBC: 3.27 MIL/uL — ABNORMAL LOW (ref 3.80–5.20)
RDW: 13.9 % (ref 11.5–14.5)
RDW: 14 % (ref 11.5–14.5)
WBC: 2.1 10*3/uL — AB (ref 3.6–11.0)
WBC: 5.8 10*3/uL (ref 3.6–11.0)

## 2017-07-13 LAB — GLUCOSE, CAPILLARY
GLUCOSE-CAPILLARY: 117 mg/dL — AB (ref 65–99)
Glucose-Capillary: 123 mg/dL — ABNORMAL HIGH (ref 65–99)

## 2017-07-13 LAB — FERRITIN: Ferritin: 19 ng/mL (ref 11–307)

## 2017-07-13 LAB — LACTATE DEHYDROGENASE, PLEURAL OR PERITONEAL FLUID: LD FL: 37 U/L — AB (ref 3–23)

## 2017-07-13 LAB — ALBUMIN, PLEURAL OR PERITONEAL FLUID: Albumin, Fluid: <1 g/dL

## 2017-07-13 LAB — MAGNESIUM: Magnesium: 1.5 mg/dL — ABNORMAL LOW (ref 1.7–2.4)

## 2017-07-13 LAB — BODY FLUID CELL COUNT WITH DIFFERENTIAL
Eos, Fluid: 0 %
LYMPHS FL: 23 %
Monocyte-Macrophage-Serous Fluid: 73 %
Neutrophil Count, Fluid: 4 %
WBC FLUID: 47 uL

## 2017-07-13 LAB — PROTIME-INR
INR: 1.41
PROTHROMBIN TIME: 17.1 s — AB (ref 11.4–15.2)

## 2017-07-13 LAB — FOLATE: Folate: 9.5 ng/mL (ref >5.9)

## 2017-07-13 LAB — PHOSPHORUS: Phosphorus: 3.2 mg/dL (ref 2.5–4.6)

## 2017-07-13 LAB — VITAMIN B12: VITAMIN B 12: 410 pg/mL (ref 180–914)

## 2017-07-13 MED ORDER — FUROSEMIDE 20 MG PO TABS
20.0000 mg | ORAL_TABLET | Freq: Every day | ORAL | Status: DC
  Administered 2017-07-13 – 2017-07-14 (×2): 20 mg via ORAL
  Filled 2017-07-13 (×2): qty 1

## 2017-07-13 MED ORDER — ALBUMIN HUMAN 25 % IV SOLN: 12.5000 g | Freq: Once | INTRAVENOUS | Status: DC

## 2017-07-13 MED ORDER — SODIUM CHLORIDE 0.9 % IV SOLN
510.0000 mg | Freq: Once | INTRAVENOUS | Status: AC
  Administered 2017-07-13: 510 mg via INTRAVENOUS
  Filled 2017-07-13: qty 17

## 2017-07-13 MED ORDER — SPIRONOLACTONE 25 MG PO TABS
50.0000 mg | ORAL_TABLET | Freq: Every day | ORAL | Status: DC
  Administered 2017-07-13 – 2017-07-14 (×2): 50 mg via ORAL
  Filled 2017-07-13 (×2): qty 2

## 2017-07-13 MED ORDER — INSULIN ASPART 100 UNIT/ML ~~LOC~~ SOLN
0.0000 [IU] | Freq: Three times a day (TID) | SUBCUTANEOUS | Status: DC
  Administered 2017-07-13: 1 [IU] via SUBCUTANEOUS
  Filled 2017-07-13 (×2): qty 1

## 2017-07-13 MED ORDER — ALBUMIN HUMAN 25 % IV SOLN
50.0000 g | Freq: Once | INTRAVENOUS | Status: AC
  Administered 2017-07-13: 50 g via INTRAVENOUS
  Filled 2017-07-13: qty 200

## 2017-07-13 MED ORDER — MAGNESIUM SULFATE 4 GM/100ML IV SOLN
4.0000 g | Freq: Once | INTRAVENOUS | Status: AC
  Administered 2017-07-13: 4 g via INTRAVENOUS
  Filled 2017-07-13: qty 100

## 2017-07-13 MED ORDER — ORAL CARE MOUTH RINSE
15.0000 mL | Freq: Two times a day (BID) | OROMUCOSAL | Status: DC
  Administered 2017-07-13 – 2017-07-14 (×3): 15 mL via OROMUCOSAL

## 2017-07-13 MED ORDER — CEFTRIAXONE SODIUM 1 G IJ SOLR
1.0000 g | INTRAMUSCULAR | Status: DC
  Administered 2017-07-13 – 2017-07-14 (×2): 1 g via INTRAVENOUS
  Filled 2017-07-13 (×3): qty 10

## 2017-07-13 NOTE — Progress Notes (Signed)
[facility] [place] NAME: Jennifer [name]    MR#:  [id number]  DATE OF BIRTH:  [birth year]  SUBJECTIVE:   Patient intubated and sedated on ventilator  REVIEW OF SYSTEMS:    Intubated   Tolerating Diet: npo      DRUG ALLERGIES:   Allergies  Allergen Reactions  . Celecoxib Other (See Comments)    Hearing loss while taking  . Codeine Other (See Comments)    Pt denies  . Hydrocodone-Acetaminophen Other (See Comments)    Pt denies  . Nitrofurantoin Other (See Comments)  . Nitrofurantoin Monohyd Macro     "Sugar got high"  . Penicillins     Has patient had a PCN reaction causing immediate rash, facial/tongue/throat swelling, SOB or lightheadedness with hypotension: Unknown Has patient had a PCN reaction causing severe rash involving mucus membranes or skin necrosis: Unknown Has patient had a PCN reaction that required hospitalization: Unknown Has patient had a PCN reaction occurring within the last 10 years: Unknown If all of the above answers are "NO", then may proceed with Cephalosporin use.  . Sulfa Antibiotics Other (See Comments)    VITALS:  Blood pressure (!) 90/53, pulse 69, temperature 97.6 F (36.4 C), temperature source Oral, resp. rate 14, height 5' 2"  (1.575 m), weight 89.8 kg (197 lb 15.6 oz), SpO2 95 %.  PHYSICAL EXAMINATION:  Constitutional: Appears ill appearing. No distress. HENT: Normocephalic. Debated and sedated on the Eyes: Conjunctivae and EOM are normal. PERRLA, no scleral icterus.  Neck: Normal ROM. Neck supple. No JVD. No tracheal deviation. CVS: RRR, S1/S2 +, no murmurs, no gallops, no carotid bruit.  Pulmonary: Effort and breath sounds normal, no stridor, rhonchi, wheezes, rales.  Abdominal: Distended abdomen  Musculoskeletal: 1+ edema Neuro: Intubated and sedated Skin: Skin is warm and dry. No rash noted. Psychiatric: Normal mood and affect.      LABORATORY PANEL:   CBC Recent Labs  Lab  07/13/17 0720  WBC 2.1*  HGB 8.8*  HCT 25.4*  PLT 76*   ------------------------------------------------------------------------------------------------------------------  Chemistries  Recent Labs  Lab 07/12/17 1145 07/13/17 0020  NA 135 137  K 3.7 4.2  CL 102 107  CO2 24 22  GLUCOSE 157* 183*  BUN 21* 23*  CREATININE 0.65 0.72  CALCIUM 8.3* 7.8*  MG  --  1.5*  AST 32  --   ALT 15  --   ALKPHOS 65  --   BILITOT 1.4*  --    ------------------------------------------------------------------------------------------------------------------  Cardiac Enzymes No results for input(s): TROPONINI in the last 168 hours. ------------------------------------------------------------------------------------------------------------------  RADIOLOGY:  Dg Chest 1 View  Result Date: 07/12/2017 CLINICAL DATA:  Status post intubation. EXAM: CHEST 1 VIEW COMPARISON:  Chest radiograph 07/04/2010. FINDINGS: ET tube terminates in the mid trachea. Monitoring leads overlie the patient. Low lung volumes. Bibasilar heterogeneous opacities. Stable cardiac and mediastinal contours. IMPRESSION: ET tube terminates in the mid trachea. Low lung volumes with basilar opacities favored to represent atelectasis. Infection not excluded. Electronically Signed   By: Lovey Newcomer M.D.   On: 07/12/2017 19:08     ASSESSMENT AND PLAN:   77 year old female with history of Jennifer [name] who presents with hematemesis and rectal bleed.  1. Upper GI bleed: Patient underwent EGD Patient has severe portal hypertensive gastropathy and for large varices. Stigmata of recent bleeding was evident 1 varices started bleeding during procedure. All 4 varices have been banded Continue octreotide and PPI  2. Acute blood loss anemia with chronic  anemia due to acute bleeding esophageal varices Continue Rocephin  3.NASH with ascites: Paracentesis is being performed May benefit from nadolol once blood pressure has improved   4. Acute  hypoxic respiratory failure: Patient required intubation for EGD. Patient left intubated postprocedure because of high risk of bleeding Management as per intensivist   5. Diabetes: Add sliding scale   CODE STATUS: full  TOTAL TIME TAKING CARE OF THIS PATIENT: 28 minutes.   Critically ill   POSSIBLE D/C ??, DEPENDING ON CLINICAL CONDITION.   Jennifer [name] M.D on 07/13/2017 at 11:08 AM  Between 7am to 6pm - Pager - [phone] After 6pm go to [email or web address] - password [email or web address] [facility]  Office  [phone]  CC: Primary care physician; Margo Common, PA  Note: This dictation was prepared with Dragon dictation along with smaller phrase technology. Any transcriptional errors that result from this process are unintentional.

## 2017-07-13 NOTE — Progress Notes (Addendum)
Initial Nutrition Assessment  DOCUMENTATION CODES:   Obesity unspecified  INTERVENTION:   It tube feeds initiated recommend Vital HP at 85m/hr with Prostat 337mvia tube QID  Propofol at 2098mr- provides 528kcal/day   Regimen with propofol will provide 1288kcal/day, 92g/day protein, 300m9mee water   NUTRITION DIAGNOSIS:   Inadequate oral intake related to inability to eat(pt sedated and ventillated ) as evidenced by NPO status.  GOAL:   Provide needs based on ASPEN/SCCM guidelines  MONITOR:   Diet advancement, Vent status, Labs, Weight trends, I & O's, Skin  REASON FOR ASSESSMENT:   Ventilator    ASSESSMENT:   76 y63. female history of cirrhosis probably secondary to NASH, thrombocytopenia, presents with one-day history of hematemesis, and 3mon19month abdominal distention, swelling of legs.   Pt sedated and ventilated. No family at bedside. Pt s/p EGD 3/7; found to have 4 large varices which were banded. Pt with low Mg; this is being supplemented. Pt to have possible paracentesis today. Possible breathing trial and extubation today. On sliding scale insulin. Per chart, wt stable pta.   Medications reviewed and include: insulin, protonix, ceftriaxone, LRS @100ml /hr, Mg sulfate, octreotide, propofol  Labs reviewed: BUN 23(H), Ca 7.8(L), P 3.2 wnl, Mg 1.5(L) Wbc- 2.1(L), Hgb 8.8(L), Hct 25.4(L) cbgs- 157, 183 x 24 hrs  Patient is currently intubated on ventilator support MV: 6.1 L/min Temp (24hrs), Avg:97.5 F (36.4 C), Min:96.7 F (35.9 C), Max:98.7 F (37.1 C)  Propofol: 20 ml/hr  MAP-   NUTRITION - FOCUSED PHYSICAL EXAM:    Most Recent Value  Orbital Region  No depletion  Upper Arm Region  No depletion  Thoracic and Lumbar Region  No depletion  Buccal Region  No depletion  Temple Region  No depletion  Clavicle Bone Region  No depletion  Clavicle and Acromion Bone Region  No depletion  Scapular Bone Region  No depletion  Dorsal Hand  No depletion   Patellar Region  No depletion  Anterior Thigh Region  No depletion  Posterior Calf Region  No depletion  Edema (RD Assessment)  None  Hair  Reviewed  Eyes  Unable to assess  Mouth  Reviewed  Skin  Reviewed  Nails  Reviewed       Diet Order:  No diet orders on file  EDUCATION NEEDS:   Not appropriate for education at this time  Skin:  Reviewed RN Assessment  Last BM:  PTA  Height:   Ht Readings from Last 1 Encounters:  07/12/17 5' 2"  (1.575 m)    Weight:   Wt Readings from Last 1 Encounters:  07/13/17 197 lb 15.6 oz (89.8 kg)    Ideal Body Weight:  50 kg  BMI:  Body mass index is 36.21 kg/m.  Estimated Nutritional Needs:   Kcal:  1000-1250kcal/day   Protein:  100-110g/day   Fluid:  >1.5L/day or per MD  CaseyKoleen DistanceRD, LDN Pager #- [phone] Hours Pager: [phone]

## 2017-07-13 NOTE — Progress Notes (Signed)
[facility] Progess Note    SYNOPSIS   77 y.o. F presented to [facility] ED on day of admission with hematemesis and rectal bleeding.  Went from ED to endoscopy for EGD.  Was found to have esophageal varices.  Required intubation for procedure.  Left intubated post procedure because of high risk of rebleeding.  ASSESSMENT/PLAN   Bleeding esophageal varices. Status post EGD and banding. Empirically on Rocephin. H&H is stable.  On mechanical ventilation. Patient was left intubated secondary to upper GI bleed with increased risk of rebleeding. Has had no further episode of bleeding overnight. Will place on spontaneous awakening and breathing trial and wean sedation and extubate if tolerated.  Cirrhosis with ascites. Evaluation for paracentesis  Pancytopenia. Most likely secondary to cirrhosis  Diabetes mellitus. On sliding-scale coverage  Critical care time 35 minutes   VENTILATOR SETTINGS: Vent Mode: PRVC FiO2 (%):  [28 %-35 %] 28 % Set Rate:  [14 bmp] 14 bmp Vt Set:  [500 mL] 500 mL PEEP:  [5 cmH20] 5 cmH20 Pressure Support:  [5 cmH20] 5 cmH20 Plateau Pressure:  [18 cmH20-23 cmH20] 18 cmH20  INTAKE / OUTPUT:  Intake/Output Summary (Last 24 hours) at 07/13/2017 1109 Last data filed at 07/13/2017 0811 Gross per 24 hour  Intake 3475.44 ml  Output 1050 ml  Net 2425.44 ml     Name: Jennifer [name] MRN: [id number] DOB: [birth year]    ADMISSION DATE:  07/12/2017   SUBJECTIVE:   Pt currently on the ventilator, can not provide history or review of systems. Pending paracentesis prior to spontaneous awakening and breathing trial  VITAL SIGNS: Temp:  [96.7 F (35.9 C)-99.1 F (37.3 C)] 97.6 F (36.4 C) (03/08 0811) Pulse Rate:  [64-116] 69 (03/08 1000) Resp:  [13-22] 14 (03/08 1000) BP: (86-130)/(50-77) 90/53 (03/08 1000) SpO2:  [89 %-100 %] 95 % (03/08 1000) FiO2 (%):  [28 %-35 %] 28 % (03/08 0854) Weight:  [84.4 kg (186 lb)-89.8 kg (197 lb 15.6 oz)] 89.8  kg (197 lb 15.6 oz) (03/08 0342)  PHYSICAL EXAMINATION: Physical Examination:   VS: BP (!) 90/53   Pulse 69   Temp 97.6 F (36.4 C) (Oral)   Resp 14   Ht 5' 2"  (1.575 m)   Wt 89.8 kg (197 lb 15.6 oz)   SpO2 95%   BMI 36.21 kg/m   General Appearance: No distress  Neuro:without focal findings, presently sedated limited exam HEENT: PERRLA, EOM intact.orally intubated, no stridor, no accessory muscle utilization Pulmonary: normal breath sounds   Cardiovascular Normal S1,S2.  No m/r/g.   Abdomen: Benign, Soft,  Skin:   warm, no rashes, no ecchymosis  Extremities: normal, no cyanosis, clubbing.    LABORATORY PANEL:   CBC Recent Labs  Lab 07/13/17 0720  WBC 2.1*  HGB 8.8*  HCT 25.4*  PLT 76*    Chemistries  Recent Labs  Lab 07/12/17 1145 07/13/17 0020  NA 135 137  K 3.7 4.2  CL 102 107  CO2 24 22  GLUCOSE 157* 183*  BUN 21* 23*  CREATININE 0.65 0.72  CALCIUM 8.3* 7.8*  MG  --  1.5*  PHOS  --  3.2  AST 32  --   ALT 15  --   ALKPHOS 65  --   BILITOT 1.4*  --     Recent Labs  Lab 07/12/17 1523  GLUCAP 88   No results for input(s): PHART, PCO2ART, PO2ART in the last 168 hours. Recent Labs  Lab 07/12/17 1145  AST 32  ALT 15  ALKPHOS 65  BILITOT 1.4*  ALBUMIN 2.9*    Cardiac Enzymes No results for input(s): TROPONINI in the last 168 hours.  RADIOLOGY:  Dg Chest 1 View  Result Date: 07/12/2017 CLINICAL DATA:  Status post intubation. EXAM: CHEST 1 VIEW COMPARISON:  Chest radiograph 07/04/2010. FINDINGS: ET tube terminates in the mid trachea. Monitoring leads overlie the patient. Low lung volumes. Bibasilar heterogeneous opacities. Stable cardiac and mediastinal contours. IMPRESSION: ET tube terminates in the mid trachea. Low lung volumes with basilar opacities favored to represent atelectasis. Infection not excluded. Electronically Signed   By: Lovey Newcomer M.D.   On: 07/12/2017 19:08     Hermelinda Dellen, DO 07/13/2017

## 2017-07-13 NOTE — Progress Notes (Signed)
Jennifer Darby, MD [address]  [place]  [place], [place] [address]  Main: [phone]  Fax: [phone] Pager: [phone]   Subjective: Underwent urgent EGD yesterday secondary to hematemesis, found to have large esophageal varices, underwent ligation. Patient remain intubated overnight due to variceal hemorrhage. She is extubated this morning, doing well. She denies any further episodes of hematemesis or melena or rectal bleeding. She tolerated diet well. Her daughter is bedside. She also underwent paracentesis today and 6.2 L of clear yellow fluid was removed. She feels significantly better. She denies nausea, vomiting, abdominal pain, fever, chills.   Objective: Vital signs in last 24 hours: Vitals:   07/13/17 1400 07/13/17 1500 07/13/17 1556 07/13/17 1600  BP: 133/71 133/70  128/66  Pulse: 77 77 77 77  Resp: 19 (!) _0 Temp:   97.7 F (36.5 C)   TempSrc:   Oral   SpO2: 100% 99% 96% 97%  Weight:      Height:       Weight change:   Intake/Output Summary (Last 24 hours) at 07/13/2017 1632 Last data filed at 07/13/2017 1130 Gross per 24 hour  Intake 3475.44 ml  Output 1225 ml  Net 2250.44 ml     Exam: Heart:: Regular rate and rhythm or S1S2 present Lungs: clear to auscultation Abdomen: soft, nontender, normal bowel sounds   Lab Results: CBC Latest Ref Rng & Units 07/13/2017 07/12/2017 07/12/2017  WBC 3.6 - 11.0 K/uL 2.1(L) 2.0(L) 3.9  Hemoglobin 12.0 - 16.0 g/dL 8.8(L) 6.8(L) 7.5(L)  Hematocrit 35.0 - 47.0 % 25.4(L) 20.3(L) 23.1(L)  Platelets 150 - 440 K/uL 76(L) 72(L) 89(L)   Hepatic Function Latest Ref Rng & Units 07/12/2017 05/11/2017 01/17/2017  Total Protein 6.5 - 8.1 g/dL 6.2(L) 6.7 6.5  Albumin 3.5 - 5.0 g/dL 2.9(L) 3.6 3.4(L)  AST 15 - 41 U/L 32 30 37  ALT 14 - 54 U/L _1 Alk Phosphatase 38 - 126 U/L 65 70 61  Total Bilirubin 0.3 - 1.2 mg/dL 1.4(H) 0.9 1.5(H)   BMP Latest Ref Rng & Units 07/13/2017 07/12/2017 05/25/2017  Glucose 65 - 99  mg/dL 183(H) 157(H) -  BUN 6 - 20 mg/dL 23(H) 21(H) -  Creatinine 0.44 - 1.00 mg/dL 0.72 0.65 -  BUN/Creat Ratio 12 - 28 - - -  Sodium 135 - 145 mmol/L 137 135 -  Potassium 3.5 - 5.1 mmol/L 4.2 3.7 3.7  Chloride 101 - 111 mmol/L 107 102 -  CO2 22 - 32 mmol/L 22 24 -  Calcium 8.9 - 10.3 mg/dL 7.8(L) 8.3(L) -   Micro Results: Recent Results (from the past 240 hour(s))  MRSA PCR Screening     Status: None   Collection Time: 07/12/17  6:12 PM  Result Value Ref Range Status   MRSA by PCR NEGATIVE NEGATIVE Final    Comment:        The GeneXpert MRSA Assay (FDA approved for NASAL specimens only), is one component of a comprehensive MRSA colonization surveillance program. It is not intended to diagnose MRSA infection nor to guide or monitor treatment for MRSA infections. Performed at [facility], [address], [place], [place] [address]    Studies/Results: Dg Chest 1 View  Result Date: 07/12/2017 CLINICAL DATA:  Status post intubation. EXAM: CHEST 1 VIEW COMPARISON:  Chest radiograph 07/04/2010. FINDINGS: ET tube terminates in the mid trachea. Monitoring leads overlie the patient. Low lung volumes. Bibasilar heterogeneous opacities. Stable cardiac and mediastinal contours.  IMPRESSION: ET tube terminates in the mid trachea. Low lung volumes with basilar opacities favored to represent atelectasis. Infection not excluded. Electronically Signed   By: Lovey Newcomer M.D.   On: 07/12/2017 19:08   US Paracentesis  Result Date: 07/13/2017 INDICATION: 77 year old female with a history of upper GI bleeding and cirrhosis. Ascites. EXAM: ULTRASOUND GUIDED  PARACENTESIS MEDICATIONS: None. COMPLICATIONS: None PROCEDURE: Informed written consent was obtained from the patient's family after a discussion of the risks, benefits and alternatives to treatment. A timeout was performed prior to the initiation of the procedure. Initial ultrasound scanning demonstrates a large amount of ascites within  the right lower abdominal quadrant. The right lower abdomen was prepped and draped in the usual sterile fashion. 1% lidocaine with epinephrine was used for local anesthesia. Following this, a 8 Fr Safe-T-Centesis catheter was introduced. An ultrasound image was saved for documentation purposes. The paracentesis was performed. The catheter was removed and a dressing was applied. The patient tolerated the procedure well without immediate post procedural complication. FINDINGS: A total of approximately 6.5 L of thin yellow fluid was removed. Samples were sent to the laboratory as requested by the clinical team. IMPRESSION: Status post bedside ultrasound guided paracentesis. Electronically Signed   By: Corrie Mckusick D.O.   On: 07/13/2017 12:23   Medications: I have reviewed the patient's current medications. Scheduled Meds: . chlorhexidine gluconate (MEDLINE KIT)  15 mL Mouth Rinse BID  . insulin aspart  0-9 Units Subcutaneous TID WC  . mouth rinse  15 mL Mouth Rinse BID  . pantoprazole (PROTONIX) IV  40 mg Intravenous Q12H   Continuous Infusions: . sodium chloride    . cefTRIAXone (ROCEPHIN)  IV    . lactated ringers 100 mL/hr at 07/13/17 1622  . octreotide  (SANDOSTATIN)    IV infusion 50 mcg/hr (07/13/17 1242)   PRN Meds:.albuterol, fentaNYL (SUBLIMAZE) injection, fentaNYL (SUBLIMAZE) injection, [DISCONTINUED] ondansetron **OR** ondansetron (ZOFRAN) IV   Assessment: Active Problems:   Acute upper GI bleed   Other cirrhosis of liver (HCC)  Jennifer [name] is a 77 y.o. female with known history of cirrhosis, portal hypertension manifested as splenomegaly and thrombocytopenia presents with decompensated cirrhosis with ascites and 1 day history of hematemesis secondary to variceal bleed s/p EGD on 07/12/2017 bleeding esophageal varix, EVL x 4. Patient is currently extubated and there is no evidence of active GI bleed  Plan: Upper GI bleed secondary to esophageal varices: s/p EVL x 4 - Continue  octreotide drip for 72 hours total  - Protonix 40 mg twice daily for 2 weeks for post-banding ulcer bleeding prophylaxis  - Advance diet as tolerated  - Recommend to start nadolol or propranolol at the time of discharge for secondary variceal prophylaxis  - Check CBC  - Recommend EGD in 4 weeks for surveillance of varices and banding   Decompensated cirrhosis: Hepatitis B surface antigen negative, HCV antibody negative Portal hypertension Volume overload: With ascites and swelling of legs, status post repeated paracentesis. There is no evidence of SBP. SAAG > 1.1. Recommend to start Lasix 20 mg, spironolactone 50 mg daily, monitor electrolytes and adjust dosage accordingly based on the volume status Recommend ultrasound abdomen with Dopplers Anemia/thromboytopenia/coagulopathy: low iron, recommend parenteral iron Esophageal varices: Status post EVL, repeat EGD in 4 weeks HCC screening:  ultrasound liver and Doppler, check AFP PSE: None  Anticipate discharge home on 07/15/2017 if patient continues to remain stable with no active signs of GI bleed. Follow-up with [facility] in one to  2 weeks after discharge   LOS: 1 day   Tanga Gloor 07/13/2017, 4:32 PM

## 2017-07-13 NOTE — Progress Notes (Signed)
Pharmacy Electrolyte Monitoring Consult:  Pharmacy consulted to assist in monitoring and replacing electrolytes in this 77 y.o. female admitted on 07/12/2017 with Hematemesis and Blood In Stools   Labs:  Sodium (mmol/L)  Date Value  07/13/2017 137  05/11/2017 140   Potassium (mmol/L)  Date Value  07/13/2017 4.2   Magnesium (mg/dL)  Date Value  07/13/2017 1.5 (L)   Phosphorus (mg/dL)  Date Value  07/13/2017 3.2   Calcium (mg/dL)  Date Value  07/13/2017 7.8 (L)   Albumin (g/dL)  Date Value  07/12/2017 2.9 (L)  05/11/2017 3.6    Plan: Will replace magnesium 1.5 with MgSulfate 4g IV once.  Will continue to monitor and replace electrolytes as necessary   Candelaria Stagers, PharmD  Pharmacy Resident  07/13/2017 11:24 AM

## 2017-07-13 NOTE — Procedures (Signed)
Interventional Radiology Procedure Note  Procedure: US guided paracentesis  Complications: None Recommendations:  - follow up labs - Do not submerge for 7 days - Routine care   Signed,  Dulcy Fanny. Earleen Newport, DO

## 2017-07-13 NOTE — Progress Notes (Signed)
Pt extubated- patient alert and oriented.  NSR on monitor- on 3 liters nasal cannula vitals stable.

## 2017-07-13 NOTE — Anesthesia Postprocedure Evaluation (Signed)
Anesthesia Post Note  Patient: Jennifer [name]  Procedure(s) Performed: ESOPHAGOGASTRODUODENOSCOPY (EGD) WITH PROPOFOL (N/A )  Patient location during evaluation: ICU Anesthesia Type: General Level of consciousness: patient remains intubated per anesthesia plan Pain management: pain level controlled Vital Signs Assessment: post-procedure vital signs reviewed and stable Respiratory status: patient on ventilator - see flowsheet for VS Cardiovascular status: stable Anesthetic complications: no     Last Vitals:  Vitals:   07/13/17 0600 07/13/17 0636  BP: (!) 92/53 (!) 90/52  Pulse: 67 64  Resp: 14 14  Temp: (!) 36.2 C   SpO2: 95% 95%    Last Pain:  Vitals:   07/13/17 0600  TempSrc: Axillary  PainSc:                  Jennifer [name]

## 2017-07-14 ENCOUNTER — Inpatient Hospital Stay: Payer: Medicare HMO

## 2017-07-14 LAB — CBC
HEMATOCRIT: 28.9 % — AB (ref 35.0–47.0)
Hemoglobin: 9.6 g/dL — ABNORMAL LOW (ref 12.0–16.0)
MCH: 29.2 pg (ref 26.0–34.0)
MCHC: 33.3 g/dL (ref 32.0–36.0)
MCV: 87.9 fL (ref 80.0–100.0)
Platelets: 81 10*3/uL — ABNORMAL LOW (ref 150–440)
RBC: 3.29 MIL/uL — AB (ref 3.80–5.20)
RDW: 15 % — ABNORMAL HIGH (ref 11.5–14.5)
WBC: 3.8 10*3/uL (ref 3.6–11.0)

## 2017-07-14 LAB — GLUCOSE, CAPILLARY
GLUCOSE-CAPILLARY: 112 mg/dL — AB (ref 65–99)
GLUCOSE-CAPILLARY: 127 mg/dL — AB (ref 65–99)
GLUCOSE-CAPILLARY: 125 mg/dL — AB (ref 65–99)
Glucose-Capillary: 110 mg/dL — ABNORMAL HIGH (ref 65–99)

## 2017-07-14 LAB — HEMOGLOBIN AND HEMATOCRIT, BLOOD
HCT: 29.4 % — ABNORMAL LOW (ref 35.0–47.0)
Hemoglobin: 10 g/dL — ABNORMAL LOW (ref 12.0–16.0)

## 2017-07-14 MED ORDER — NADOLOL 20 MG PO TABS: 20.0000 mg | ORAL_TABLET | Freq: Every day | ORAL | Status: DC

## 2017-07-14 MED ORDER — NADOLOL 20 MG PO TABS
20.0000 mg | ORAL_TABLET | Freq: Every day | ORAL | Status: DC
  Administered 2017-07-14: 20 mg via ORAL
  Filled 2017-07-14 (×2): qty 1

## 2017-07-14 NOTE — Progress Notes (Addendum)
[facility] Inpatient Progress Note  Subjective: Patient seen for follow-up of esophageal variceal bleed. Doing well, alert and oriented. No pain or recurrent melena/hemetemesis. At cream potatoes today.   Objective: Vital signs in last 24 hours: Temp:  [97.5 F (36.4 C)-98.8 F (37.1 C)] 97.5 F (36.4 C) (03/09 0845) Pulse Rate:  [70-85] 70 (03/09 0845) Resp:  [16-26] 18 (03/09 0341) BP: (104-133)/(52-70) 104/55 (03/09 0845) SpO2:  [91 %-100 %] 100 % (03/09 0845) Weight:  [83.1 kg (183 lb 3.2 oz)] 83.1 kg (183 lb 3.2 oz) (03/09 0149) Blood pressure (!) 104/55, pulse 70, temperature (!) 97.5 F (36.4 C), temperature source Oral, resp. rate 18, height _0  (1.575 m), weight 83.1 kg (183 lb 3.2 oz), SpO2 100 %.    Intake/Output from previous day: 03/08 0701 - 03/09 0700 In: 110.1 [I.V.:10.1; IV Piggyback:100] Out: 850 [Urine:850]  Intake/Output this shift: Total I/O In: 120 [P.O.:120] Out: 250 [Urine:250]   General appearance:  Alert, NAD Resp:  CTA Cardio:  RRR nl S1, S2 GI:  Soft, BS+. No mass. Extremities:  Trace edema bilaterally.    Lab Results: Results for orders placed or performed during the hospital encounter of 07/12/17 (from the past 24 hour(s))  Glucose, capillary     Status: Abnormal   Collection Time: 07/13/17  4:30 PM  Result Value Ref Range   Glucose-Capillary 117 (H) 65 - 99 mg/dL  CBC     Status: Abnormal   Collection Time: 07/13/17  8:25 PM  Result Value Ref Range   WBC 5.8 3.6 - 11.0 K/uL   RBC 3.27 (L) 3.80 - 5.20 MIL/uL   Hemoglobin 9.7 (L) 12.0 - 16.0 g/dL   HCT 28.2 (L) 35.0 - 47.0 %   MCV 86.1 80.0 - 100.0 fL   MCH 29.6 26.0 - 34.0 pg   MCHC 34.4 32.0 - 36.0 g/dL   RDW 14.0 11.5 - 14.5 %   Platelets 95 (L) 150 - 440 K/uL  Ferritin     Status: None   Collection Time: 07/13/17  8:25 PM  Result Value Ref Range   Ferritin 19 11 - 307 ng/mL  Folate     Status: None   Collection Time: 07/13/17  8:25 PM  Result Value  Ref Range   Folate 9.5 >5.9 ng/mL  Hemoglobin and hematocrit, blood     Status: Abnormal   Collection Time: 07/14/17  1:27 AM  Result Value Ref Range   Hemoglobin 10.0 (L) 12.0 - 16.0 g/dL   HCT 29.4 (L) 35.0 - 47.0 %  CBC     Status: Abnormal   Collection Time: 07/14/17  6:18 AM  Result Value Ref Range   WBC 3.8 3.6 - 11.0 K/uL   RBC 3.29 (L) 3.80 - 5.20 MIL/uL   Hemoglobin 9.6 (L) 12.0 - 16.0 g/dL   HCT 28.9 (L) 35.0 - 47.0 %   MCV 87.9 80.0 - 100.0 fL   MCH 29.2 26.0 - 34.0 pg   MCHC 33.3 32.0 - 36.0 g/dL   RDW 15.0 (H) 11.5 - 14.5 %   Platelets 81 (L) 150 - 440 K/uL  Glucose, capillary     Status: Abnormal   Collection Time: 07/14/17  8:01 AM  Result Value Ref Range   Glucose-Capillary 110 (H) 65 - 99 mg/dL   Comment 1 Notify RN   Glucose, capillary     Status: Abnormal   Collection Time: 07/14/17 11:49 AM  Result Value Ref Range   Glucose-Capillary 112 (H)  65 - 99 mg/dL   Comment 1 Notify RN      Recent Labs    07/13/17 0720 07/13/17 2025 07/14/17 0127 07/14/17 0618  WBC 2.1* 5.8  --  3.8  HGB 8.8* 9.7* 10.0* 9.6*  HCT 25.4* 28.2* 29.4* 28.9*  PLT 76* 95*  --  81*   BMET Recent Labs    07/12/17 1145 07/13/17 0020  NA 135 137  K 3.7 4.2  CL 102 107  CO2 24 22  GLUCOSE 157* 183*  BUN 21* 23*  CREATININE 0.65 0.72  CALCIUM 8.3* 7.8*   LFT Recent Labs    07/12/17 1145  PROT 6.2*  ALBUMIN 2.9*  AST 32  ALT 15  ALKPHOS 65  BILITOT 1.4*   PT/INR Recent Labs    07/12/17 1145 07/13/17 0020  LABPROT 16.3* 17.1*  INR 1.32 1.41   Hepatitis Panel No results for input(s): HEPBSAG, HCVAB, HEPAIGM, HEPBIGM in the last 72 hours. C-Diff No results for input(s): CDIFFTOX in the last 72 hours. No results for input(s): CDIFFPCR in the last 72 hours.   Studies/Results: Dg Chest 1 View  Result Date: 07/12/2017 CLINICAL DATA:  Status post intubation. EXAM: CHEST 1 VIEW COMPARISON:  Chest radiograph 07/04/2010. FINDINGS: ET tube terminates in the mid  trachea. Monitoring leads overlie the patient. Low lung volumes. Bibasilar heterogeneous opacities. Stable cardiac and mediastinal contours. IMPRESSION: ET tube terminates in the mid trachea. Low lung volumes with basilar opacities favored to represent atelectasis. Infection not excluded. Electronically Signed   By: Lovey Newcomer M.D.   On: 07/12/2017 19:08   US Paracentesis  Result Date: 07/13/2017 INDICATION: 77 year old female with a history of upper GI bleeding and cirrhosis. Ascites. EXAM: ULTRASOUND GUIDED  PARACENTESIS MEDICATIONS: None. COMPLICATIONS: None PROCEDURE: Informed written consent was obtained from the patient's family after a discussion of the risks, benefits and alternatives to treatment. A timeout was performed prior to the initiation of the procedure. Initial ultrasound scanning demonstrates a large amount of ascites within the right lower abdominal quadrant. The right lower abdomen was prepped and draped in the usual sterile fashion. 1% lidocaine with epinephrine was used for local anesthesia. Following this, a 8 Fr Safe-T-Centesis catheter was introduced. An ultrasound image was saved for documentation purposes. The paracentesis was performed. The catheter was removed and a dressing was applied. The patient tolerated the procedure well without immediate post procedural complication. FINDINGS: A total of approximately 6.5 L of thin yellow fluid was removed. Samples were sent to the laboratory as requested by the clinical team. IMPRESSION: Status post bedside ultrasound guided paracentesis. Electronically Signed   By: Corrie Mckusick D.O.   On: 07/13/2017 12:23   US Liver Doppler  Result Date: 07/14/2017 CLINICAL DATA:  Ascites, cirrhosis, history of upper GI bleed EXAM: DUPLEX ULTRASOUND OF LIVER TECHNIQUE: Color and duplex Doppler ultrasound was performed to evaluate the hepatic in-flow and out-flow vessels. COMPARISON:  None. FINDINGS: Portal Vein shows antegrade flow on color Doppler  just beyond the level of the portosplenic confluence, and is patent into the liver although Doppler shows concurrent antegrade and retrograde flow signals. No evidence of occlusion or thrombus. On grayscale, no evidence of cavernous transformation of the portal vein. There is hepatopetal flow in right and left branches. Main:  11-16 cm/sec Right:  4 cm/sec Left:  10 cm/sec Hepatic Vein Velocities (all hepatopetal) Right:  23 cm/sec Middle:  19 cm/sec Left:    43 cm/sec Intrahepatic IVC patent, 14 cm/sec. Hepatic Artery Velocity:  55 cm/sec Splenic Vein Velocity: 20 cm/sec. No evidence of occlusion or thrombus. Spleen 17 x 7 x 15 cm (volume = 900 cm^3). Varices: Not visualized Ascites: Small, perihepatic IMPRESSION: 1. Patent inflow and outflow vessels of the liver without occlusion or thrombus. 2. Small amount of perihepatic ascites. 3. Splenomegaly Electronically Signed   By: Lucrezia Europe M.D.   On: 07/14/2017 08:52    Scheduled Inpatient Medications:   . chlorhexidine gluconate (MEDLINE KIT)  15 mL Mouth Rinse BID  . furosemide  20 mg Oral Daily  . insulin aspart  0-9 Units Subcutaneous TID WC  . mouth rinse  15 mL Mouth Rinse BID  . nadolol  20 mg Oral Daily  . pantoprazole (PROTONIX) IV  40 mg Intravenous Q12H  . spironolactone  50 mg Oral Daily    Continuous Inpatient Infusions:   . cefTRIAXone (ROCEPHIN)  IV Stopped (07/13/17 1802)  . lactated ringers 100 mL/hr at 07/14/17 1427  . octreotide  (SANDOSTATIN)    IV infusion 50 mcg/hr (07/14/17 0931)    PRN Inpatient Medications:  albuterol, fentaNYL (SUBLIMAZE) injection, fentaNYL (SUBLIMAZE) injection, [DISCONTINUED] ondansetron **OR** ondansetron (ZOFRAN) IV  Miscellaneous: Abdominal ultrasound with doppler flow - No evidence of hepatic or portal vein obstruction. Small amount of perihepatic ascites was noted.  Assessment:  1. NASH cirrhosis - with end stage symptoms of variceal bleeding. No evidence of PVT on Ultrasound. Followed and  treated by Dr. Marius Ditch.  2. Esophageal variceal bleed s/p EGD with EVL x 2.  3. Ascites  Plan:  1. Continue current treatment.  2. Agree with discharge home tomorrow if otherwise stable.  3. F/U with Dr. Marius Ditch as advised 1-2 weeks.   Ok to stop octreotide just before discharge. Call back if I can help.   Hollyanne Schloesser K. Alice Reichert, M.D. 07/14/2017, 2:35 PM

## 2017-07-14 NOTE — Progress Notes (Signed)
[facility] [place] NAME: Jennifer [name]    MR#:  [id number]  DATE OF BIRTH:  1941-04-07  SUBJECTIVE:  CHIEF COMPLAINT:   Chief Complaint  Patient presents with  . Hematemesis  . Blood In Stools   - doing well, no complaints. No further GI bleed -Hemoglobin is stable  REVIEW OF SYSTEMS:  Review of Systems  Constitutional: Positive for malaise/fatigue. Negative for chills and fever.  HENT: Negative for congestion, ear discharge, hearing loss and nosebleeds.   Eyes: Negative for blurred vision and double vision.  Respiratory: Negative for cough, shortness of breath and wheezing.   Cardiovascular: Negative for chest pain, palpitations and leg swelling.  Gastrointestinal: Negative for abdominal pain, constipation, diarrhea, nausea and vomiting.  Genitourinary: Negative for dysuria and urgency.  Musculoskeletal: Negative for myalgias.  Neurological: Negative for dizziness, speech change, focal weakness, seizures and headaches.  Psychiatric/Behavioral: Negative for depression.    DRUG ALLERGIES:   Allergies  Allergen Reactions  . Celecoxib Other (See Comments)    Hearing loss while taking  . Codeine Other (See Comments)    Pt denies  . Hydrocodone-Acetaminophen Other (See Comments)    Pt denies  . Nitrofurantoin Other (See Comments)  . Nitrofurantoin Monohyd Macro     "Sugar got high"  . Penicillins     Has patient had a PCN reaction causing immediate rash, facial/tongue/throat swelling, SOB or lightheadedness with hypotension: Unknown Has patient had a PCN reaction causing severe rash involving mucus membranes or skin necrosis: Unknown Has patient had a PCN reaction that required hospitalization: Unknown Has patient had a PCN reaction occurring within the last 10 years: Unknown If all of the above answers are "NO", then may proceed with Cephalosporin use.  . Sulfa Antibiotics Other (See Comments)    VITALS:  Blood pressure  (!) 104/55, pulse 70, temperature (!) 97.5 F (36.4 C), temperature source Oral, resp. rate 18, height 5' 2"  (1.575 m), weight 83.1 kg (183 lb 3.2 oz), SpO2 100 %.  PHYSICAL EXAMINATION:  Physical Exam  GENERAL:  77 y.o.-year-old patient lying in the bed with no acute distress.  EYES: Pupils equal, round, reactive to light and accommodation. No scleral icterus. Extraocular muscles intact.  HEENT: Head atraumatic, normocephalic. Oropharynx and nasopharynx clear.  NECK:  Supple, no jugular venous distention. No thyroid enlargement, no tenderness.  LUNGS: Normal breath sounds bilaterally, no wheezing, rales,rhonchi or crepitation. No use of accessory muscles of respiration.  CARDIOVASCULAR: S1, S2 normal. No rubs, or gallops. 2/6 systolic murmur is present ABDOMEN: Soft, nontender, nondistended. Bowel sounds present. No organomegaly or mass.  EXTREMITIES: No pedal edema, cyanosis, or clubbing.  NEUROLOGIC: Cranial nerves II through XII are intact. Muscle strength 5/5 in all extremities. Sensation intact. Gait not checked.  PSYCHIATRIC: The patient is alert and oriented x 3.  SKIN: No obvious rash, lesion, or ulcer.    LABORATORY PANEL:   CBC Recent Labs  Lab 07/14/17 0618  WBC 3.8  HGB 9.6*  HCT 28.9*  PLT 81*   ------------------------------------------------------------------------------------------------------------------  Chemistries  Recent Labs  Lab 07/12/17 1145 07/13/17 0020  NA 135 137  K 3.7 4.2  CL 102 107  CO2 24 22  GLUCOSE 157* 183*  BUN 21* 23*  CREATININE 0.65 0.72  CALCIUM 8.3* 7.8*  MG  --  1.5*  AST 32  --   ALT 15  --   ALKPHOS 65  --   BILITOT 1.4*  --    ------------------------------------------------------------------------------------------------------------------  Cardiac Enzymes No results for input(s): TROPONINI in the last 168  hours. ------------------------------------------------------------------------------------------------------------------  RADIOLOGY:  Dg Chest 1 View  Result Date: 07/12/2017 CLINICAL DATA:  Status post intubation. EXAM: CHEST 1 VIEW COMPARISON:  Chest radiograph 07/04/2010. FINDINGS: ET tube terminates in the mid trachea. Monitoring leads overlie the patient. Low lung volumes. Bibasilar heterogeneous opacities. Stable cardiac and mediastinal contours. IMPRESSION: ET tube terminates in the mid trachea. Low lung volumes with basilar opacities favored to represent atelectasis. Infection not excluded. Electronically Signed   By: Lovey Newcomer M.D.   On: 07/12/2017 19:08   US Paracentesis  Result Date: 07/13/2017 INDICATION: 77 year old female with a history of upper GI bleeding and cirrhosis. Ascites. EXAM: ULTRASOUND GUIDED  PARACENTESIS MEDICATIONS: None. COMPLICATIONS: None PROCEDURE: Informed written consent was obtained from the patient's family after a discussion of the risks, benefits and alternatives to treatment. A timeout was performed prior to the initiation of the procedure. Initial ultrasound scanning demonstrates a large amount of ascites within the right lower abdominal quadrant. The right lower abdomen was prepped and draped in the usual sterile fashion. 1% lidocaine with epinephrine was used for local anesthesia. Following this, a 8 Fr Safe-T-Centesis catheter was introduced. An ultrasound image was saved for documentation purposes. The paracentesis was performed. The catheter was removed and a dressing was applied. The patient tolerated the procedure well without immediate post procedural complication. FINDINGS: A total of approximately 6.5 L of thin yellow fluid was removed. Samples were sent to the laboratory as requested by the clinical team. IMPRESSION: Status post bedside ultrasound guided paracentesis. Electronically Signed   By: Corrie Mckusick D.O.   On: 07/13/2017 12:23   US Liver  Doppler  Result Date: 07/14/2017 CLINICAL DATA:  Ascites, cirrhosis, history of upper GI bleed EXAM: DUPLEX ULTRASOUND OF LIVER TECHNIQUE: Color and duplex Doppler ultrasound was performed to evaluate the hepatic in-flow and out-flow vessels. COMPARISON:  None. FINDINGS: Portal Vein shows antegrade flow on color Doppler just beyond the level of the portosplenic confluence, and is patent into the liver although Doppler shows concurrent antegrade and retrograde flow signals. No evidence of occlusion or thrombus. On grayscale, no evidence of cavernous transformation of the portal vein. There is hepatopetal flow in right and left branches. Main:  11-16 cm/sec Right:  4 cm/sec Left:  10 cm/sec Hepatic Vein Velocities (all hepatopetal) Right:  23 cm/sec Middle:  19 cm/sec Left:    43 cm/sec Intrahepatic IVC patent, 14 cm/sec. Hepatic Artery Velocity:  55 cm/sec Splenic Vein Velocity: 20 cm/sec. No evidence of occlusion or thrombus. Spleen 17 x 7 x 15 cm (volume = 900 cm^3). Varices: Not visualized Ascites: Small, perihepatic IMPRESSION: 1. Patent inflow and outflow vessels of the liver without occlusion or thrombus. 2. Small amount of perihepatic ascites. 3. Splenomegaly Electronically Signed   By: Lucrezia Europe M.D.   On: 07/14/2017 08:52    EKG:   Orders placed or performed during the hospital encounter of 07/12/17  . EKG 12-Lead  . EKG 12-Lead    ASSESSMENT AND PLAN:   77 year old female with past medical history significant for liver cirrhosis secondary to [facility], diabetes, hypertension presents to hospital secondary to worsening abdominal distention and lower extremity edema and also noted to have hematemesis  1. GI bleed-with known history of varices from cirrhosis. -Appreciate GI consult. On octreotide drip and also Protonix twice a day -Status post EGD and for esophageal varices were banded. -Hemoglobin slight drop noted but stable. -Follow up hemoglobin in a.m. -  Tolerating regular diet well. -On  Rocephin due to ascites and GI bleed  2. Liver cirrhosis-decompensated, [name] -Presented with ascites and status post 6 L abdominal fluid drained -Started on oral Lasix, Aldactone. Also nadolol due to esophageal varices -Liver ultrasound done this morning to rule out portal vein thrombosis. -GI follow up as outpatient recommended  3. Iron deficiency anemia-received 1 dose of IV iron in the hospital. Discharge on oral iron supplements  4. Hypertension-blood pressure well controlled in the hospital. Hold quinapril  5. DVT prophylaxis-Ted's and SCDs. Encourage ambulation   All the records are reviewed and case discussed with Care Management/Social Workerr. Management plans discussed with the patient, family and they are in agreement.  CODE STATUS: Full code  TOTAL TIME TAKING CARE OF THIS PATIENT: 37 minutes.   POSSIBLE D/C IN 1-2 DAYS, DEPENDING ON CLINICAL CONDITION.   Gladstone Lighter M.D on 07/14/2017 at 9:38 AM  Between 7am to 6pm - Pager - [phone]  After 6pm go to [email or web address] - password [email or web address] [facility]  Office  [phone]  CC: Primary care physician; Chrismon, Vickki Muff, PA

## 2017-07-14 NOTE — Plan of Care (Signed)
Patient is not having active bleeding at this time.

## 2017-07-15 LAB — BPAM RBC
BLOOD PRODUCT EXPIRATION DATE: 201903282359
BLOOD PRODUCT EXPIRATION DATE: 201903282359
Blood Product Expiration Date: 201903282359
Blood Product Expiration Date: 201903282359
ISSUE DATE / TIME: 201903080033
ISSUE DATE / TIME: 201903080329
UNIT TYPE AND RH: 5100
Unit Type and Rh: 5100
Unit Type and Rh: 5100
Unit Type and Rh: 5100

## 2017-07-15 LAB — CBC
HEMATOCRIT: 28.9 % — AB (ref 35.0–47.0)
Hemoglobin: 9.7 g/dL — ABNORMAL LOW (ref 12.0–16.0)
MCH: 29.6 pg (ref 26.0–34.0)
MCHC: 33.7 g/dL (ref 32.0–36.0)
MCV: 87.7 fL (ref 80.0–100.0)
PLATELETS: 87 10*3/uL — AB (ref 150–440)
RBC: 3.29 MIL/uL — ABNORMAL LOW (ref 3.80–5.20)
RDW: 14.8 % — AB (ref 11.5–14.5)
WBC: 3.3 10*3/uL — ABNORMAL LOW (ref 3.6–11.0)

## 2017-07-15 LAB — BASIC METABOLIC PANEL
Anion gap: 5 (ref 5–15)
BUN: 20 mg/dL (ref 6–20)
CO2: 28 mmol/L (ref 22–32)
CREATININE: 0.73 mg/dL (ref 0.44–1.00)
Calcium: 7.8 mg/dL — ABNORMAL LOW (ref 8.9–10.3)
Chloride: 108 mmol/L (ref 101–111)
Glucose, Bld: 104 mg/dL — ABNORMAL HIGH (ref 65–99)
Potassium: 3.7 mmol/L (ref 3.5–5.1)
Sodium: 141 mmol/L (ref 135–145)

## 2017-07-15 LAB — TYPE AND SCREEN
ABO/RH(D): O POS
ANTIBODY SCREEN: NEGATIVE
UNIT DIVISION: 0
UNIT DIVISION: 0
UNIT DIVISION: 0
Unit division: 0

## 2017-07-15 LAB — GLUCOSE, CAPILLARY
GLUCOSE-CAPILLARY: 87 mg/dL (ref 65–99)
Glucose-Capillary: 96 mg/dL (ref 65–99)

## 2017-07-15 LAB — AFP TUMOR MARKER: AFP, SERUM, TUMOR MARKER: 2.4 ng/mL (ref 0.0–8.3)

## 2017-07-15 LAB — MAGNESIUM: Magnesium: 1.8 mg/dL (ref 1.7–2.4)

## 2017-07-15 LAB — PREPARE RBC (CROSSMATCH)

## 2017-07-15 MED ORDER — SPIRONOLACTONE 50 MG PO TABS: 50.0000 mg | ORAL_TABLET | Freq: Every day | ORAL | 2 refills | Status: DC

## 2017-07-15 MED ORDER — FUROSEMIDE 20 MG PO TABS: 20.0000 mg | ORAL_TABLET | Freq: Every day | ORAL | 2 refills | Status: DC

## 2017-07-15 MED ORDER — NADOLOL 20 MG PO TABS: 20.0000 mg | ORAL_TABLET | Freq: Every day | ORAL | 2 refills | Status: DC

## 2017-07-15 MED ORDER — PANTOPRAZOLE SODIUM 40 MG PO TBEC: 40.0000 mg | DELAYED_RELEASE_TABLET | Freq: Two times a day (BID) | ORAL | 2 refills | Status: DC

## 2017-07-15 NOTE — Progress Notes (Signed)
Pharmacy Electrolyte Monitoring Consult:  Pharmacy consulted to assist in monitoring and replacing electrolytes in this 77 y.o. female admitted on 07/12/2017 with Hematemesis and Blood In Stools   Labs:  Sodium (mmol/L)  Date Value  07/15/2017 141  05/11/2017 140   Potassium (mmol/L)  Date Value  07/15/2017 3.7   Magnesium (mg/dL)  Date Value  07/15/2017 1.8   Phosphorus (mg/dL)  Date Value  07/13/2017 3.2   Calcium (mg/dL)  Date Value  07/15/2017 7.8 (L)   Albumin (g/dL)  Date Value  07/12/2017 2.9 (L)  05/11/2017 3.6    Plan: K 3.7, Mag 1.8. Patient on lasix 34m po daily. F/u labs in am.  KChinita GreenlandPharmD Clinical Pharmacist 07/15/2017

## 2017-07-15 NOTE — Progress Notes (Signed)
Pt discharged home today with prescriptions, follow-up appointment information, and instructions. VSS, no concerns/complaints.

## 2017-07-15 NOTE — Plan of Care (Signed)
  Clinical Measurements: Diagnostic test results will improve 07/15/2017 1101 - Adequate for Discharge by Alen Blew, RN   Clinical Measurements: Ability to maintain clinical measurements within normal limits will improve 07/15/2017 1101 - Adequate for Discharge by Alen Blew, RN

## 2017-07-15 NOTE — Discharge Summary (Signed)
[facility] [place] NAME: Jennifer [name]    MR#:  [id number]  DATE OF BIRTH:  July 20, 1940  DATE OF ADMISSION:  07/12/2017   ADMITTING PHYSICIAN: Epifanio Lesches, MD  DATE OF DISCHARGE: 07/15/17  PRIMARY CARE PHYSICIAN: Chrismon, Vickki Muff, PA   ADMISSION DIAGNOSIS:   Acute upper GI bleed [K92.2] Hemorrhoids, unspecified hemorrhoid type [K64.9] Acute anemia [D64.9]  DISCHARGE DIAGNOSIS:   Active Problems:   Acute upper GI bleed   Other cirrhosis of liver ([place])   SECONDARY DIAGNOSIS:   Past Medical History:  Diagnosis Date  . Allergy   . Arthritis   . Asthma   . Colon polyp   . Diabetes mellitus without complication ([place]) 0932  . Fatty liver   . GERD (gastroesophageal reflux disease)   . Hyperlipidemia   . Hypertension   . Mild nonproliferative diabetic retinopathy of both eyes ([place])   . Motion sickness    boats    HOSPITAL COURSE:   77 year old female with past medical history significant for liver cirrhosis secondary to [facility], diabetes, hypertension presents to hospital secondary to worsening abdominal distention and lower extremity edema and also noted to have hematemesis  1. GI bleed-with known history of varices from cirrhosis. -Appreciate GI consult. Was on octreotide drip and also Protonix IV- discharged on oral Protonix twice a day -Status post EGD and four esophageal varices were banded. -Hemoglobin slight drop noted but stable at 9.7 -Tolerating regular diet well. -Received Rocephin due to ascites and GI bleed while in the hospital.  2. Liver cirrhosis-decompensated, NASH -Presented with ascites and status post 6 L abdominal fluid drained -Started on oral Lasix, Aldactone. Also nadolol due to esophageal varices -Liver ultrasound negative for any portal vein thrombosis -GI follow up as outpatient recommended  3. Iron deficiency anemia-received 1 dose of IV iron in the hospital. Discharge on oral iron  supplements  4. Hypertension-blood pressure well controlled in the hospital. Actually on lower side. Discontinue quinapril- will be on aldactone, nadolol and lasix as BP tolerates  Ambulating independently. will be discharged home today  DISCHARGE CONDITIONS:   Guarded  CONSULTS OBTAINED:   Treatment Team:  Lin Landsman, MD  DRUG ALLERGIES:   Allergies  Allergen Reactions  . Celecoxib Other (See Comments)    Hearing loss while taking  . Codeine Other (See Comments)    Pt denies  . Hydrocodone-Acetaminophen Other (See Comments)    Pt denies  . Nitrofurantoin Other (See Comments)  . Nitrofurantoin Monohyd Macro     "Sugar got high"  . Penicillins     Has patient had a PCN reaction causing immediate rash, facial/tongue/throat swelling, SOB or lightheadedness with hypotension: Unknown Has patient had a PCN reaction causing severe rash involving mucus membranes or skin necrosis: Unknown Has patient had a PCN reaction that required hospitalization: Unknown Has patient had a PCN reaction occurring within the last 10 years: Unknown If all of the above answers are "NO", then may proceed with Cephalosporin use.  . Sulfa Antibiotics Other (See Comments)   DISCHARGE MEDICATIONS:   Allergies as of 07/15/2017      Reactions   Celecoxib Other (See Comments)   Hearing loss while taking   Codeine Other (See Comments)   Pt denies   Hydrocodone-acetaminophen Other (See Comments)   Pt denies   Nitrofurantoin Other (See Comments)   Nitrofurantoin Monohyd Macro    "Sugar got high"   Penicillins    Has patient had  a PCN reaction causing immediate rash, facial/tongue/throat swelling, SOB or lightheadedness with hypotension: Unknown Has patient had a PCN reaction causing severe rash involving mucus membranes or skin necrosis: Unknown Has patient had a PCN reaction that required hospitalization: Unknown Has patient had a PCN reaction occurring within the last 10 years: Unknown If  all of the above answers are "NO", then may proceed with Cephalosporin use.   Sulfa Antibiotics Other (See Comments)      Medication List    STOP taking these medications   Baclofen 5 MG Tabs   ibuprofen 600 MG tablet Commonly known as:  ADVIL,MOTRIN   omeprazole 20 MG capsule Commonly known as:  PRILOSEC   Potassium 99 MG Tabs   quinapril 10 MG tablet Commonly known as:  ACCUPRIL     TAKE these medications   ACCU-CHEK AVIVA device Use as instructed   ACCU-CHEK AVIVA PLUS w/Device Kit TEST FASTING BLOOD SUGAR ONCE A DAY.   ACCU-CHEK FASTCLIX LANCET Kit Test fasting blood sugar daily.   albuterol 108 (90 Base) MCG/ACT inhaler Commonly known as:  PROVENTIL HFA;VENTOLIN HFA Inhale 2 puffs into the lungs every 6 (six) hours as needed for wheezing or shortness of breath.   furosemide 20 MG tablet Commonly known as:  LASIX Take 1 tablet (20 mg total) by mouth daily.   glucose blood test strip Commonly known as:  ACCU-CHEK AVIVA Check sugar twice daily DX E 11.9   IRON PO Take 45 mg by mouth.   metFORMIN 500 MG tablet Commonly known as:  GLUCOPHAGE TAKE 1 TABLET TWICE DAILY WITH MEALS What changed:    how much to take  how to take this  when to take this   nadolol 20 MG tablet Commonly known as:  CORGARD Take 1 tablet (20 mg total) by mouth daily. Hold if BP is low SBP<90 or feeling dizzy   pantoprazole 40 MG tablet Commonly known as:  PROTONIX Take 1 tablet (40 mg total) by mouth 2 (two) times daily.   spironolactone 50 MG tablet Commonly known as:  ALDACTONE Take 1 tablet (50 mg total) by mouth daily. Start taking on:  07/16/2017   TURMERIC CURCUMIN PO Take by mouth.   Vitamin D (Ergocalciferol) 50000 units Caps capsule Commonly known as:  DRISDOL TAKE 1 CAPSULE EVERY WEEK        DISCHARGE INSTRUCTIONS:   1. PCP follow-up in 1-2 weeks 2. GI follow up in 3-4 weeks  DIET:   Cardiac diet  ACTIVITY:   Activity as tolerated  OXYGEN:     Home Oxygen: No.  Oxygen Delivery: room air  DISCHARGE LOCATION:   home   If you experience worsening of your admission symptoms, develop shortness of breath, life threatening emergency, suicidal or homicidal thoughts you must seek medical attention immediately by calling 911 or calling your MD immediately  if symptoms less severe.  You Must read complete instructions/literature along with all the possible adverse reactions/side effects for all the Medicines you take and that have been prescribed to you. Take any new Medicines after you have completely understood and accpet all the possible adverse reactions/side effects.   Please note  You were cared for by a hospitalist during your hospital stay. If you have any questions about your discharge medications or the care you received while you were in the hospital after you are discharged, you can call the unit and asked to speak with the hospitalist on call if the hospitalist that took care of you is  not available. Once you are discharged, your primary care physician will handle any further medical issues. Please note that NO REFILLS for any discharge medications will be authorized once you are discharged, as it is imperative that you return to your primary care physician (or establish a relationship with a primary care physician if you do not have one) for your aftercare needs so that they can reassess your need for medications and monitor your lab values.    On the day of Discharge:  VITAL SIGNS:   Blood pressure (!) 90/54, pulse (!) 50, temperature 97.9 F (36.6 C), resp. rate 14, height _0  (1.575 m), weight 82.6 kg (182 lb), SpO2 95 %.  PHYSICAL EXAMINATION:   GENERAL:  77 y.o.-year-old patient lying in the bed with no acute distress.  EYES: Pupils equal, round, reactive to light and accommodation. No scleral icterus. Extraocular muscles intact.  HEENT: Head atraumatic, normocephalic. Oropharynx and nasopharynx clear.  NECK:   Supple, no jugular venous distention. No thyroid enlargement, no tenderness.  LUNGS: Normal breath sounds bilaterally, no wheezing, rales,rhonchi or crepitation. No use of accessory muscles of respiration.  CARDIOVASCULAR: S1, S2 normal. No rubs, or gallops. 2/6 systolic murmur is present ABDOMEN: Soft, nontender, nondistended. Bowel sounds present. No organomegaly or mass.  EXTREMITIES: No pedal edema, cyanosis, or clubbing.  NEUROLOGIC: Cranial nerves II through XII are intact. Muscle strength 5/5 in all extremities. Sensation intact. Gait not checked.  PSYCHIATRIC: The patient is alert and oriented x 3.  SKIN: No obvious rash, lesion, or ulcer.    DATA REVIEW:   CBC Recent Labs  Lab 07/15/17 0421  WBC 3.3*  HGB 9.7*  HCT 28.9*  PLT 87*    Chemistries  Recent Labs  Lab 07/12/17 1145  07/15/17 0421  NA 135   < > 141  K 3.7   < > 3.7  CL 102   < > 108  CO2 24   < > 28  GLUCOSE 157*   < > 104*  BUN 21*   < > 20  CREATININE 0.65   < > 0.73  CALCIUM 8.3*   < > 7.8*  MG  --    < > 1.8  AST 32  --   --   ALT 15  --   --   ALKPHOS 65  --   --   BILITOT 1.4*  --   --    < > = values in this interval not displayed.     Microbiology Results  Results for orders placed or performed during the hospital encounter of 07/12/17  MRSA PCR Screening     Status: None   Collection Time: 07/12/17  6:12 PM  Result Value Ref Range Status   MRSA by PCR NEGATIVE NEGATIVE Final    Comment:        The GeneXpert MRSA Assay (FDA approved for NASAL specimens only), is one component of a comprehensive MRSA colonization surveillance program. It is not intended to diagnose MRSA infection nor to guide or monitor treatment for MRSA infections. Performed at [facility], [address] [place], [place] [address]   Body fluid culture     Status: None (Preliminary result)   Collection Time: 07/13/17  9:53 AM  Result Value Ref Range Status   Specimen Description   Final     ABDOMEN FLUID Performed at [facility], [address], [place], [place] [address]    Special Requests   Final    ABDOMEN  FLUID Performed at [facility], [place], [place] [address]    Gram Stain   Final    CYTOSPIN SMEAR WBC PRESENT,BOTH PMN AND MONONUCLEAR NO ORGANISMS SEEN    Culture   Final    NO GROWTH 1 DAY Performed at [facility], [place] [address] [place], [place] [address]    Report Status PENDING  Incomplete    RADIOLOGY:  No results found.   Management plans discussed with the patient, family and they are in agreement.  CODE STATUS:     Code Status Orders  (From admission, onward)        Start     Ordered   07/12/17 1326  Full code  Continuous     07/12/17 1333    Code Status History    Date Active Date Inactive Code Status Order ID Comments User Context   This patient has a current code status but no historical code status.      TOTAL TIME TAKING CARE OF THIS PATIENT: 38 minutes.    Gladstone Lighter M.D on 07/15/2017 at 9:36 AM  Between 7am to 6pm - Pager - [phone]  After 6pm go to [email or web address] - Proofreader  [facility]  Office  [phone]  CC: Primary care physician; Margo Common, PA   Note: This dictation was prepared with Dragon dictation along with smaller phrase technology. Any transcriptional errors that result from this process are unintentional.

## 2017-07-16 ENCOUNTER — Telehealth: Payer: Self-pay

## 2017-07-16 ENCOUNTER — Other Ambulatory Visit: Payer: Self-pay | Admitting: Family Medicine

## 2017-07-16 DIAGNOSIS — E119 Type 2 diabetes mellitus without complications: Secondary | ICD-10-CM

## 2017-07-16 LAB — BODY FLUID CULTURE: CULTURE: NO GROWTH

## 2017-07-16 LAB — GLUCOSE, CAPILLARY: Glucose-Capillary: 131 mg/dL — ABNORMAL HIGH (ref 65–99)

## 2017-07-16 NOTE — Telephone Encounter (Signed)
Transition Care Management Follow-Up Telephone Call   Date discharged and where: [facility] on 07/15/17.  How have you been since you were released from the hospital?  Doing ok, slept good last night. Stomach hurts (above naval) after eating but goes away over time. Denies throat pain, fever or n/v/d. Hasnt took metformin since last Wednesday. BS reading this AM was 86. Pt is only eating soft foods or drinking liquids.   Any patient concerns? None.  Items Reviewed:   Meds: verified  Allergies: verified  Dietary Changes Reviewed: low sodium heart healthy  Functional Questionnaire:  Independent-I Dependent-D  ADLs:   Dressing- I    Eating- I   Maintaining continence- I   Transferring- I   Transportation- I   Meal Prep- I   Managing Meds- I  Confirmed importance and Date/Time of follow-up visits scheduled: 07/19/17 @ 11:00 AM   Confirmed with patient if condition worsens to call PCP or go to the Emergency Dept. Patient was given office number and encouraged to call back with questions or concerns: YES

## 2017-07-16 NOTE — Telephone Encounter (Signed)
Acknowledged.

## 2017-07-18 ENCOUNTER — Other Ambulatory Visit: Payer: Self-pay

## 2017-07-18 ENCOUNTER — Inpatient Hospital Stay: Payer: Medicare HMO | Attending: Internal Medicine

## 2017-07-18 ENCOUNTER — Inpatient Hospital Stay (HOSPITAL_BASED_OUTPATIENT_CLINIC_OR_DEPARTMENT_OTHER): Payer: Medicare HMO | Admitting: Internal Medicine

## 2017-07-18 VITALS — BP 113/70 | HR 54 | Temp 97.9°F | Resp 24 | Ht 62.0 in | Wt 187.8 lb

## 2017-07-18 DIAGNOSIS — D696 Thrombocytopenia, unspecified: Secondary | ICD-10-CM

## 2017-07-18 DIAGNOSIS — R188 Other ascites: Secondary | ICD-10-CM | POA: Insufficient documentation

## 2017-07-18 DIAGNOSIS — K7460 Unspecified cirrhosis of liver: Secondary | ICD-10-CM

## 2017-07-18 LAB — COMPREHENSIVE METABOLIC PANEL
ALBUMIN: 2.9 g/dL — AB (ref 3.5–5.0)
ALT: 16 U/L (ref 14–54)
AST: 34 U/L (ref 15–41)
Alkaline Phosphatase: 64 U/L (ref 38–126)
Anion gap: 8 (ref 5–15)
BUN: 12 mg/dL (ref 6–20)
CHLORIDE: 102 mmol/L (ref 101–111)
CO2: 28 mmol/L (ref 22–32)
Calcium: 8.3 mg/dL — ABNORMAL LOW (ref 8.9–10.3)
Creatinine, Ser: 0.68 mg/dL (ref 0.44–1.00)
GFR calc Af Amer: >60 mL/min (ref >60)
GFR calc non Af Amer: >60 mL/min (ref >60)
GLUCOSE: 116 mg/dL — AB (ref 65–99)
POTASSIUM: 3.8 mmol/L (ref 3.5–5.1)
Sodium: 138 mmol/L (ref 135–145)
Total Bilirubin: 1 mg/dL (ref 0.3–1.2)
Total Protein: 5.7 g/dL — ABNORMAL LOW (ref 6.5–8.1)

## 2017-07-18 LAB — CBC WITH DIFFERENTIAL/PLATELET
Basophils Absolute: 0.1 10*3/uL (ref 0–0.1)
Basophils Relative: 1 %
EOS PCT: 2 %
Eosinophils Absolute: 0.1 10*3/uL (ref 0–0.7)
HCT: 33.1 % — ABNORMAL LOW (ref 35.0–47.0)
Hemoglobin: 11.2 g/dL — ABNORMAL LOW (ref 12.0–16.0)
LYMPHS PCT: 28 %
Lymphs Abs: 1.3 10*3/uL (ref 1.0–3.6)
MCH: 30.4 pg (ref 26.0–34.0)
MCHC: 33.8 g/dL (ref 32.0–36.0)
MCV: 90 fL (ref 80.0–100.0)
MONO ABS: 0.3 10*3/uL (ref 0.2–0.9)
Monocytes Relative: 8 %
Neutro Abs: 2.9 10*3/uL (ref 1.4–6.5)
Neutrophils Relative %: 61 %
PLATELETS: 115 10*3/uL — AB (ref 150–440)
RBC: 3.68 MIL/uL — AB (ref 3.80–5.20)
RDW: 14.7 % — ABNORMAL HIGH (ref 11.5–14.5)
WBC: 4.7 10*3/uL (ref 3.6–11.0)

## 2017-07-18 NOTE — Progress Notes (Signed)
[place] NOTE  Patient Care Team: Chrismon, Vickki Muff, PA as PCP - General (Family Medicine) Bary Castilla, Forest Gleason, MD (General Surgery) Anell Barr, OD as Consulting Physician (Optometry) Cammie Sickle, MD as Consulting Physician (Internal Medicine)  CHIEF COMPLAINTS/PURPOSE OF CONSULTATION:   # THROMBOCYTOPENIA- [2015-100s-80s; 2017] N-Hb/WBC;  Hypersplenism/splenomegaly; no clumping.   #  Cirrhosis/ splenomegaly [US- 2016; stable since 2013]; Hep B/Hep C negative on 12/29/15; DECOMPENSATION of CIRRHOSIS [feb 2019; Dr.Vanga]  HISTORY OF PRESENTING ILLNESS:  Jennifer [name] 77 y.o.  female Caucasian patient history of compensated cirrhosis/splenomegaly [based on imaging] is here for follow-up.  Unfortunately patient had a complicated hospital course when she presented with a GI bleed/variceal bleed-decompensation of cirrhosis.. Patient was also briefly intubated;  patient was transfused blood; and also had EGD with banding/ligation.  Patient had ascitic fluid drained.   Patient currently admits to worsening abdominal distention; poor p.o. intake and early satiety.  She also complains of leg swelling bilaterally.  She unfortunately stopped taking her spironolactone.   She currently denies any blood in stools or black colored stools.  Patient does complain of intermittent constipation.  She is not on stool softener.  ROS: Denies any unusual swelling in the legs. Denies any skin rash. A complete 10 point review of system is done which is negative except mentioned above in history of present illness  MEDICAL HISTORY:  Past Medical History:  Diagnosis Date  . Allergy   . Arthritis   . Asthma   . Colon polyp   . Diabetes mellitus without complication ([place]) 0768  . Fatty liver   . GERD (gastroesophageal reflux disease)   . Hyperlipidemia   . Hypertension   . Mild nonproliferative diabetic retinopathy of both eyes ([place])   . Motion sickness    boats     SURGICAL HISTORY: Past Surgical History:  Procedure Laterality Date  . ABDOMINAL HYSTERECTOMY  1978  . COLONOSCOPY WITH PROPOFOL N/A 02/01/2015   Procedure: COLONOSCOPY WITH PROPOFOL;  Surgeon: Lucilla Lame, MD;  Location: [facility];  Service: Endoscopy;  Laterality: N/A;  Diabetic - oral meds  . ESOPHAGOGASTRODUODENOSCOPY (EGD) WITH PROPOFOL N/A 07/12/2017   Procedure: ESOPHAGOGASTRODUODENOSCOPY (EGD) WITH PROPOFOL;  Surgeon: Lin Landsman, MD;  Location: [facility];  Service: Gastroenterology;  Laterality: N/A;  . POLYPECTOMY  02/01/2015   Procedure: POLYPECTOMY;  Surgeon: Lucilla Lame, MD;  Location: [facility];  Service: Endoscopy;;  . TONSILLECTOMY AND ADENOIDECTOMY      SOCIAL HISTORY: Social History   Socioeconomic History  . Marital status: Widowed    Spouse name: Not on file  . Number of children: Not on file  . Years of education: Not on file  . Highest education level: Not on file  Social Needs  . Financial resource strain: Not on file  . Food insecurity - worry: Not on file  . Food insecurity - inability: Not on file  . Transportation needs - medical: Not on file  . Transportation needs - non-medical: Not on file  Occupational History  . Not on file  Tobacco Use  . Smoking status: Former Smoker    Packs/day: 0.50    Years: 6.00    Pack years: 3.00    Types: Cigarettes    Last attempt to quit: 05/08/1987    Years since quitting: 30.2  . Smokeless tobacco: Never Used  Substance and Sexual Activity  . Alcohol use: No  . Drug use: No  . Sexual activity: Not  on file  Other Topics Concern  . Not on file  Social History Narrative  . Not on file    FAMILY HISTORY: Family History  Problem Relation Age of Onset  . Dementia Mother   . Alcohol abuse Father   . Cancer Sister        lung  . Lymphoma Sister   . Lymphoma Sister     ALLERGIES:  is allergic to celecoxib; codeine; hydrocodone-acetaminophen; nitrofurantoin;  nitrofurantoin monohyd macro; penicillins; and sulfa antibiotics.  MEDICATIONS:  Current Outpatient Medications  Medication Sig Dispense Refill  . albuterol (PROVENTIL HFA;VENTOLIN HFA) 108 (90 Base) MCG/ACT inhaler Inhale 2 puffs into the lungs every 6 (six) hours as needed for wheezing or shortness of breath. 1 Inhaler 0  . Blood Glucose Monitoring Suppl (ACCU-CHEK AVIVA PLUS) w/Device KIT TEST FASTING BLOOD SUGAR ONE TIME DAILY 1 kit 0  . Blood Glucose Monitoring Suppl (ACCU-CHEK AVIVA) device Use as instructed 1 each 0  . glucose blood (ACCU-CHEK AVIVA) test strip Check sugar twice daily DX E 11.9 100 each 3  . IRON PO Take 45 mg by mouth.    . Lancets Misc. (ACCU-CHEK FASTCLIX LANCET) KIT Test fasting blood sugar daily. 100 kit 3  . nadolol (CORGARD) 20 MG tablet Take 1 tablet (20 mg total) by mouth daily. Hold if BP is low SBP<90 or feeling dizzy 30 tablet 2  . pantoprazole (PROTONIX) 40 MG tablet Take 1 tablet (40 mg total) by mouth 2 (two) times daily. 60 tablet 2  . spironolactone (ALDACTONE) 50 MG tablet Take 1 tablet (50 mg total) by mouth daily. 30 tablet 2  . Vitamin D, Ergocalciferol, (DRISDOL) 50000 units CAPS capsule TAKE 1 CAPSULE EVERY WEEK 12 capsule 1  . metFORMIN (GLUCOPHAGE) 500 MG tablet TAKE 1 TABLET TWICE DAILY WITH MEALS (Patient not taking: Reported on 07/16/2017) 180 tablet 1  . TURMERIC CURCUMIN PO Take by mouth.     No current facility-administered medications for this visit.       Marland Kitchen  PHYSICAL EXAMINATION:   Vitals:   07/18/17 1145  BP: 113/70  Pulse: (!) 54  Resp: (!) 24  Temp: 97.9 F (36.6 C)   Filed Weights   07/18/17 1153  Weight: 187 lb 12.8 oz (85.2 kg)    GENERAL: Well-nourished well-developed; Alert, no distress and comfortable.  Accompanied by her daughter. EYES: no pallor or icterus OROPHARYNX: no thrush or ulceration; good dentition  NECK: supple, no masses felt LYMPH:  no palpable lymphadenopathy in the cervical, axillary or  inguinal regions LUNGS: clear to auscultation and  No wheeze or crackles HEART/CVS: regular rate & rhythm and no murmurs; 3+ bilateral lower extremity edema ABDOMEN: abdomen soft, non-tender and normal bowel sounds; positive for splenomegaly.  Musculoskeletal:no cyanosis of digits and no clubbing  PSYCH: alert & oriented x 3 with fluent speech NEURO: no focal motor/sensory deficits SKIN:  no rashes;   LABORATORY DATA:  I have reviewed the data as listed Lab Results  Component Value Date   WBC 4.7 07/18/2017   HGB 11.2 (L) 07/18/2017   HCT 33.1 (L) 07/18/2017   MCV 90.0 07/18/2017   PLT 115 (L) 07/18/2017   Recent Labs    05/11/17 1516  07/12/17 1145 07/13/17 0020 07/15/17 0421 07/18/17 1107  NA 140  --  135 137 141 138  K 4.2   < > 3.7 4.2 3.7 3.8  CL 100  --  102 107 108 102  CO2 26  --  24 22  28 28  GLUCOSE 118*  --  157* 183* 104* 116*  BUN 10  --  21* 23* 20 12  CREATININE 0.58  --  0.65 0.72 0.73 0.68  CALCIUM 9.3  --  8.3* 7.8* 7.8* 8.3*  GFRNONAA 89  --  >60 >60 >60 >60  GFRAA 103  --  >60 >60 >60 >60  PROT 6.7  --  6.2*  --   --  5.7*  ALBUMIN 3.6  --  2.9*  --   --  2.9*  AST 30  --  32  --   --  34  ALT 13  --  15  --   --  16  ALKPHOS 70  --  65  --   --  64  BILITOT 0.9  --  1.4*  --   --  1.0   < > = values in this interval not displayed.    RADIOGRAPHIC STUDIES: I have personally reviewed the radiological images as listed and agreed with the findings in the report. Dg Chest 1 View  Result Date: 07/12/2017 CLINICAL DATA:  Status post intubation. EXAM: CHEST 1 VIEW COMPARISON:  Chest radiograph 07/04/2010. FINDINGS: ET tube terminates in the mid trachea. Monitoring leads overlie the patient. Low lung volumes. Bibasilar heterogeneous opacities. Stable cardiac and mediastinal contours. IMPRESSION: ET tube terminates in the mid trachea. Low lung volumes with basilar opacities favored to represent atelectasis. Infection not excluded. Electronically Signed   By:  Lovey Newcomer M.D.   On: 07/12/2017 19:08   US Paracentesis  Result Date: 07/13/2017 INDICATION: 77 year old female with a history of upper GI bleeding and cirrhosis. Ascites. EXAM: ULTRASOUND GUIDED  PARACENTESIS MEDICATIONS: None. COMPLICATIONS: None PROCEDURE: Informed written consent was obtained from the patient's family after a discussion of the risks, benefits and alternatives to treatment. A timeout was performed prior to the initiation of the procedure. Initial ultrasound scanning demonstrates a large amount of ascites within the right lower abdominal quadrant. The right lower abdomen was prepped and draped in the usual sterile fashion. 1% lidocaine with epinephrine was used for local anesthesia. Following this, a 8 Fr Safe-T-Centesis catheter was introduced. An ultrasound image was saved for documentation purposes. The paracentesis was performed. The catheter was removed and a dressing was applied. The patient tolerated the procedure well without immediate post procedural complication. FINDINGS: A total of approximately 6.5 L of thin yellow fluid was removed. Samples were sent to the laboratory as requested by the clinical team. IMPRESSION: Status post bedside ultrasound guided paracentesis. Electronically Signed   By: Corrie Mckusick D.O.   On: 07/13/2017 12:23   US Liver Doppler  Result Date: 07/14/2017 CLINICAL DATA:  Ascites, cirrhosis, history of upper GI bleed EXAM: DUPLEX ULTRASOUND OF LIVER TECHNIQUE: Color and duplex Doppler ultrasound was performed to evaluate the hepatic in-flow and out-flow vessels. COMPARISON:  None. FINDINGS: Portal Vein shows antegrade flow on color Doppler just beyond the level of the portosplenic confluence, and is patent into the liver although Doppler shows concurrent antegrade and retrograde flow signals. No evidence of occlusion or thrombus. On grayscale, no evidence of cavernous transformation of the portal vein. There is hepatopetal flow in right and left branches.  Main:  11-16 cm/sec Right:  4 cm/sec Left:  10 cm/sec Hepatic Vein Velocities (all hepatopetal) Right:  23 cm/sec Middle:  19 cm/sec Left:    43 cm/sec Intrahepatic IVC patent, 14 cm/sec. Hepatic Artery Velocity:  55 cm/sec Splenic Vein Velocity: 20 cm/sec. No evidence of  occlusion or thrombus. Spleen 17 x 7 x 15 cm (volume = 900 cm^3). Varices: Not visualized Ascites: Small, perihepatic IMPRESSION: 1. Patent inflow and outflow vessels of the liver without occlusion or thrombus. 2. Small amount of perihepatic ascites. 3. Splenomegaly Electronically Signed   By: Lucrezia Europe M.D.   On: 07/14/2017 08:52    ASSESSMENT & PLAN:  Thrombocytopenia ([place]) # Thrombocytopenia secondary to hypersplenism from splenomegaly.  Platelets today 115.    #Recent GI bleed/variceal bleed decompensated cirrhosis-status post banding.  Hemoglobin stable at 11.  # Cirrhosis/recent decompensation s/p paracentesis-question reason for decompensation.  Ultrasound of the liver did not show any concerns for malignancy.  Given symptomatic ascites-I would recommend paracentesis.  I encouraged the patient to go back on nadolol/spironolactone.  Recommend keep up appointment with Dr. Marolyn Haller for further recommendations and monitoring of cirrhosis.  # constipation- miralax /Dulcolax.   # follow up 6 months/labs-CBC CMP PT PTT AFP.   Cc; Vanga/ Crismon.         Cammie Sickle, MD 07/18/2017 1:32 PM

## 2017-07-18 NOTE — Assessment & Plan Note (Addendum)
#   Thrombocytopenia secondary to hypersplenism from splenomegaly.  Platelets today 115.    #Recent GI bleed/variceal bleed decompensated cirrhosis-status post banding.  Hemoglobin stable at 11.  # Cirrhosis/recent decompensation s/p paracentesis-question reason for decompensation.  Ultrasound of the liver did not show any concerns for malignancy.  Given symptomatic ascites-I would recommend paracentesis.  I encouraged the patient to go back on nadolol/spironolactone.  Recommend keep up appointment with Dr. Marolyn Haller for further recommendations and monitoring of cirrhosis.  # constipation- miralax /Dulcolax.   # follow up 6 months/labs-CBC CMP PT PTT AFP.   Cc; Vanga/ Crismon.

## 2017-07-19 ENCOUNTER — Other Ambulatory Visit: Payer: Self-pay | Admitting: Internal Medicine

## 2017-07-19 ENCOUNTER — Encounter: Payer: Self-pay | Admitting: Family Medicine

## 2017-07-19 ENCOUNTER — Ambulatory Visit (INDEPENDENT_AMBULATORY_CARE_PROVIDER_SITE_OTHER): Payer: Medicare HMO | Admitting: Family Medicine

## 2017-07-19 ENCOUNTER — Ambulatory Visit
Admission: RE | Admit: 2017-07-19 | Discharge: 2017-07-19 | Disposition: A | Discharge status: Home / Self Care | Payer: Medicare HMO | Source: Ambulatory Visit | Attending: Internal Medicine | Admitting: Internal Medicine

## 2017-07-19 ENCOUNTER — Other Ambulatory Visit: Payer: Self-pay

## 2017-07-19 VITALS — BP 94/60 | HR 55 | Temp 98.0°F | Resp 16

## 2017-07-19 DIAGNOSIS — K7460 Unspecified cirrhosis of liver: Secondary | ICD-10-CM

## 2017-07-19 DIAGNOSIS — D696 Thrombocytopenia, unspecified: Secondary | ICD-10-CM

## 2017-07-19 DIAGNOSIS — I8511 Secondary esophageal varices with bleeding: Secondary | ICD-10-CM | POA: Diagnosis not present

## 2017-07-19 DIAGNOSIS — R188 Other ascites: Secondary | ICD-10-CM

## 2017-07-19 NOTE — Patient Outreach (Signed)
[facility]) Care Management  07/19/2017  Jennifer [name] 1940-10-15 [id number]     EMMI- General Discharge RED ON EMMI ALERT Day # 1 Date: 07/18/17 Red Alert Reason: "Scheduled follow up appt? No   Unfilled [prescriptions? Yes"   Outreach attempt # 1 to patient. Spoke with patient who voices she is doing fairly well. Reviewed and addressed red alerts with patient. She goes for PCP appt today at 11 am. She voices that she saw cancer MD on yesterday. She has not made GI appt but voices she will call today. Her sister is taking her to all appts. Patient states that her dtr is helping her out as well as she has supportive church family who are assisting her. She states that she has picked up all her meds. She voices that several of her meds were stopped. She reports she is only taking about four meds currently. She denies any issues affording and/or managing meds. She is checking BP daily and states BP this morning was 99/49 and HR 54. Patient states that her BP normally runs in the low 100's. She went ahead and took her BP med. RN CM discussed with patient the need to know BP parameters along with when and when not to take med. She will discuss this with MD today. She voices she is asymptomatic. RN CM advised patient to recheck BP in 30 mins. She voiced understanding. No further RN CM needs or concerns at this time. Advised patient that they would get one more automated EMMI-GENERAL post discharge calls to assess how they are doing following recent hospitalization and will receive a call from a nurse if any of their responses were abnormal. Patient voiced understanding and was appreciative of f/u call.       Plan: RN CM will notify [facility] administrative assistant of case status.   Enzo Montgomery, RN,BSN,CCM [place] [facility] Telephonic Care Management Coordinator Direct Phone: [phone] Toll Free: [phone] Fax: [phone]

## 2017-07-19 NOTE — Progress Notes (Signed)
Patient: Jennifer [name] Female    DOB: [birth year]   77 y.o.   MRN: [id number] Visit Date: 07/19/2017  Today's Provider: Vernie Murders, PA   Chief Complaint  Patient presents with  . Hospitalization Follow-up   Subjective:    HPI   Follow up Hospitalization  Patient was admitted to [facility] on 07/12/2017 and discharged on 07/15/2017. She was treated for acute upper GI bleed. Treatment for this included; labs, GI consult, was on octreotide drip, IV Protonix. Patient was advised to stopped this medications: baclofen, ibuprofen, omeprazole, potassium, and quinapril. Started these medications:iron supplement for anemia. Advised to follow-up with GI and pcp. Telephone follow up was done on 07/16/2017 She reports good compliance with treatment. She reports this condition is Improved.  ------------------------------------------------------- Patient states she feels better since her hospital visit.  Past Medical History:  Diagnosis Date  . Allergy   . Arthritis   . Asthma   . Colon polyp   . Diabetes mellitus without complication ([place]) 7867  . Fatty liver   . GERD (gastroesophageal reflux disease)   . Hyperlipidemia   . Hypertension   . Mild nonproliferative diabetic retinopathy of both eyes ([place])   . Motion sickness    boats   Past Surgical History:  Procedure Laterality Date  . ABDOMINAL HYSTERECTOMY  1978  . COLONOSCOPY WITH PROPOFOL N/A 02/01/2015   Procedure: COLONOSCOPY WITH PROPOFOL;  Surgeon: Lucilla Lame, MD;  Location: [place];  Service: Endoscopy;  Laterality: N/A;  Diabetic - oral meds  . ESOPHAGOGASTRODUODENOSCOPY (EGD) WITH PROPOFOL N/A 07/12/2017   Procedure: ESOPHAGOGASTRODUODENOSCOPY (EGD) WITH PROPOFOL;  Surgeon: Lin Landsman, MD;  Location: [place];  Service: Gastroenterology;  Laterality: N/A;  . POLYPECTOMY  02/01/2015   Procedure: POLYPECTOMY;  Surgeon: Lucilla Lame, MD;  Location: [place];  Service: Endoscopy;;  .  TONSILLECTOMY AND ADENOIDECTOMY     Family History  Problem Relation Age of Onset  . Dementia Mother   . Alcohol abuse Father   . Cancer Sister        lung  . Lymphoma Sister   . Lymphoma Sister    Allergies  Allergen Reactions  . Celecoxib Other (See Comments)    Hearing loss while taking  . Codeine Other (See Comments)    Pt denies  . Hydrocodone-Acetaminophen Other (See Comments)    Pt denies  . Nitrofurantoin Other (See Comments)  . Nitrofurantoin Monohyd Macro     "Sugar got high"  . Penicillins     Has patient had a PCN reaction causing immediate rash, facial/tongue/throat swelling, SOB or lightheadedness with hypotension: Unknown Has patient had a PCN reaction causing severe rash involving mucus membranes or skin necrosis: Unknown Has patient had a PCN reaction that required hospitalization: Unknown Has patient had a PCN reaction occurring within the last 10 years: Unknown If all of the above answers are "NO", then may proceed with Cephalosporin use.  . Sulfa Antibiotics Other (See Comments)    Current Outpatient Medications:  .  albuterol (PROVENTIL HFA;VENTOLIN HFA) 108 (90 Base) MCG/ACT inhaler, Inhale 2 puffs into the lungs every 6 (six) hours as needed for wheezing or shortness of breath., Disp: 1 Inhaler, Rfl: 0 .  Blood Glucose Monitoring Suppl (ACCU-CHEK AVIVA PLUS) w/Device KIT, TEST FASTING BLOOD SUGAR ONE TIME DAILY, Disp: 1 kit, Rfl: 0 .  Blood Glucose Monitoring Suppl (ACCU-CHEK AVIVA) device, Use as instructed, Disp: 1 each, Rfl: 0 .  glucose blood (ACCU-CHEK AVIVA) test  strip, Check sugar twice daily DX E 11.9, Disp: 100 each, Rfl: 3 .  IRON PO, Take 45 mg by mouth., Disp: , Rfl:  .  Lancets Misc. (ACCU-CHEK FASTCLIX LANCET) KIT, Test fasting blood sugar daily., Disp: 100 kit, Rfl: 3 .  metFORMIN (GLUCOPHAGE) 500 MG tablet, TAKE 1 TABLET TWICE DAILY WITH MEALS, Disp: 180 tablet, Rfl: 1 .  nadolol (CORGARD) 20 MG tablet, Take 1 tablet (20 mg total) by mouth  daily. Hold if BP is low SBP<90 or feeling dizzy, Disp: 30 tablet, Rfl: 2 .  pantoprazole (PROTONIX) 40 MG tablet, Take 1 tablet (40 mg total) by mouth 2 (two) times daily., Disp: 60 tablet, Rfl: 2 .  spironolactone (ALDACTONE) 50 MG tablet, Take 1 tablet (50 mg total) by mouth daily., Disp: 30 tablet, Rfl: 2 .  TURMERIC CURCUMIN PO, Take by mouth., Disp: , Rfl:  .  Vitamin D, Ergocalciferol, (DRISDOL) 50000 units CAPS capsule, TAKE 1 CAPSULE EVERY WEEK, Disp: 12 capsule, Rfl: 1  Review of Systems  Constitutional: Negative for appetite change, chills, fatigue and fever.  Respiratory: Negative for chest tightness and shortness of breath.   Cardiovascular: Negative for chest pain and palpitations.  Gastrointestinal: Negative for abdominal pain, nausea and vomiting.  Neurological: Negative for dizziness and weakness.   Social History   Tobacco Use  . Smoking status: Former Smoker    Packs/day: 0.50    Years: 6.00    Pack years: 3.00    Types: Cigarettes    Last attempt to quit: 05/08/1987    Years since quitting: 30.2  . Smokeless tobacco: Never Used  Substance Use Topics  . Alcohol use: No   Objective:   BP 94/60 (BP Location: Right Arm, Patient Position: Sitting, Cuff Size: Normal)   Pulse (!) 55   Temp 98 F (36.7 C) (Oral)   Resp 16   SpO2 97%  Vitals:   07/19/17 1111  BP: 94/60  Pulse: (!) 55  Resp: 16  Temp: 98 F (36.7 C)  TempSrc: Oral  SpO2: 97%   Physical Exam  Constitutional: She is oriented to person, place, and time. She appears well-developed and well-nourished. No distress.  HENT:  Head: Normocephalic and atraumatic.  Right Ear: Hearing normal.  Left Ear: Hearing normal.  Nose: Nose normal.  Eyes: Conjunctivae and lids are normal. Right eye exhibits no discharge. Left eye exhibits no discharge. No scleral icterus.  Neck: Neck supple.  Cardiovascular: Normal rate.  Pulmonary/Chest: Effort normal and breath sounds normal. No respiratory distress.    Abdominal: Soft. Bowel sounds are normal. She exhibits distension. There is tenderness.  Tense abdomen with ascitic wave palpable. A 2.5 x 5.0 cm pink area in the RLQ that is weeping clear fluid (suspect this was the paracentesis site).  Musculoskeletal: Normal range of motion.  Pitting edema of lower extremities 2+.  Neurological: She is alert and oriented to person, place, and time.  Skin: Skin is intact. No lesion and no rash noted.  Psychiatric: She has a normal mood and affect. Her speech is normal and behavior is normal. Thought content normal.      Assessment & Plan:     1. Cirrhosis of liver with ascites, unspecified hepatic cirrhosis type [facility]) Hospitalized 07-12-17 to 07-15-17 due to hematemesis and hematochezia suddenly at home. Found to have esophageal varices bleeding from liver cirrhosis (had a previous history of fatty liver). Ultrasound guided paracentesis on 07-13-17 removed 6.5 L of fluid. States she is feeling heaviness and tenderness across  an increasing abdominal girth. Presently on Spironolactone to help control edema. To be followed by Dr. Marius Ditch (gastroenterologist).  2. Secondary esophageal varices with bleeding ([facility]) Found varices from cirrhosis on EGD. Four varices were banded and transfused when hgb was 8.9. At discharge on 07-15-17 hgb was 9.7 and no further hematemesis occurred. Scheduled for follow up with GI in few days and will ask them to get follow up labs as they will be checking on ascites, also.       Vernie Murders, PA  [facility]

## 2017-08-03 ENCOUNTER — Ambulatory Visit: Payer: Self-pay

## 2017-08-07 ENCOUNTER — Ambulatory Visit: Payer: Medicare HMO | Admitting: Gastroenterology

## 2017-08-07 ENCOUNTER — Other Ambulatory Visit: Payer: Self-pay

## 2017-08-07 ENCOUNTER — Encounter: Payer: Self-pay | Admitting: Gastroenterology

## 2017-08-07 VITALS — BP 118/72 | HR 64 | Temp 97.4°F | Ht 61.0 in | Wt 177.8 lb

## 2017-08-07 DIAGNOSIS — K7581 Nonalcoholic steatohepatitis (NASH): Secondary | ICD-10-CM

## 2017-08-07 DIAGNOSIS — I8511 Secondary esophageal varices with bleeding: Secondary | ICD-10-CM | POA: Diagnosis not present

## 2017-08-07 DIAGNOSIS — R140 Abdominal distension (gaseous): Secondary | ICD-10-CM

## 2017-08-07 DIAGNOSIS — R188 Other ascites: Secondary | ICD-10-CM | POA: Diagnosis not present

## 2017-08-07 DIAGNOSIS — I8500 Esophageal varices without bleeding: Secondary | ICD-10-CM

## 2017-08-07 DIAGNOSIS — K7460 Unspecified cirrhosis of liver: Secondary | ICD-10-CM | POA: Diagnosis not present

## 2017-08-07 MED ORDER — FUROSEMIDE 20 MG PO TABS: 20.0000 mg | ORAL_TABLET | Freq: Every day | ORAL | 0 refills | Status: DC

## 2017-08-07 NOTE — Patient Instructions (Signed)

## 2017-08-07 NOTE — Progress Notes (Signed)
No Text

## 2017-08-07 NOTE — Progress Notes (Signed)
Jennifer Darby, MD [address]  [place]  [place], [place] [address]  Main: [phone]  Fax: [phone]    Gastroenterology Consultation  Referring Provider:     Margo Common, PA Primary Care Physician:  Margo Common, PA Primary Gastroenterologist:  Dr. Cephas [name] Reason for Consultation:   Decompensated  Cirrhosis        HPI:   Jennifer [name] is a 77 y.o. female referred by Dr. Margo Common, PA  for consultation & management of decompensated cirrhosis. Patient has history of metabolic syndrome. Patient was recently admitted to the hospital about 2 weeks ago with variceal bleed,and massive ascites, underwent EGD and ligation of esophageal varices. She had therapeutic paracentesis, fluid analysis consistent with portal hypertension and there as no evidence of SBP. She was temporarily intubated in the setting of massive upper GI bleed. She was discharged home on spironolactone, Lasix, nadolol and pantoprazole. She is known to have cirrhosis since 2013. She had CBC, CMP after discharge which are stable. She is here for hospital follow-up today. She continues to have abdominal distention, reports that swelling of legs has significantly improved, otherwise feeling well. She reports having loose nonbloody stools which is chronic. She denies hematemesis, melena, rectal bleeding, abdominal pain, nausea, vomiting. She recently had an ultrasound which revealed only trace ascites. Hep B surface antigen is negative, HCV antibody negative in 2017, ferritin 22 in 07/2016. She does not have a regular gi f/u for cirrhosis, and not on diuretics. She denies drinking ETOH. She reports that she stopped taking metformin and has been checking her blood glucose levels at home 4 times daily and numbers have been below 120  NSAIDs: none  Antiplts/Anticoagulants/Anti thrombotics: none  GI Procedures:  EGD 07/12/2017 - Normal duodenal bulb and second portion of the duodenum. -  Portal hypertensive gastropathy. - Bleeding large (> 5 mm) esophageal varices. Incompletely eradicated. Banded. - No specimens collected.  Colonoscopy 04/03/2005 normal Colonoscopy 02/01/2015 - One 8 mm polyp in the ascending colon. Resected and retrieved. Clips were placed. - One 4 mm polyp in the transverse colon. Resected and retrieved. - One 7 mm polyp in the sigmoid colon. Resected and retrieved. Clip was placed. - Non-bleeding internal hemorrhoids. 1. Colon, polyp(s), ascending colon polyp - SESSILE SERRATED POLYP. - NO DYSPLASIA OR MALIGNANCY. 2. Colon, polyp(s), transverse colon polyp - TUBULAR ADENOMA. - NO HIGH GRADE DYSPLASIA OR MALIGNANCY. 3. Colon, polyp(s), sigmoid colon polyp - TUBULAR ADENOMA. - NO HIGH GRADE DYSPLASIA OR MALIGNANCY.   Past Medical History:  Diagnosis Date  . Allergy   . Arthritis   . Asthma   . Colon polyp   . Diabetes mellitus without complication ([place]) 3832  . Fatty liver   . GERD (gastroesophageal reflux disease)   . Hyperlipidemia   . Hypertension   . Mild nonproliferative diabetic retinopathy of both eyes ([place])   . Motion sickness    boats    Past Surgical History:  Procedure Laterality Date  . ABDOMINAL HYSTERECTOMY  1978  . COLONOSCOPY WITH PROPOFOL N/A 02/01/2015   Procedure: COLONOSCOPY WITH PROPOFOL;  Surgeon: Lucilla Lame, MD;  Location: [facility];  Service: Endoscopy;  Laterality: N/A;  Diabetic - oral meds  . ESOPHAGOGASTRODUODENOSCOPY (EGD) WITH PROPOFOL N/A 07/12/2017   Procedure: ESOPHAGOGASTRODUODENOSCOPY (EGD) WITH PROPOFOL;  Surgeon: Lin Landsman, MD;  Location: [facility];  Service: Gastroenterology;  Laterality: N/A;  . POLYPECTOMY  02/01/2015   Procedure: POLYPECTOMY;  Surgeon: Lucilla Lame, MD;  Location: [facility];  Service: Endoscopy;;  . TONSILLECTOMY AND ADENOIDECTOMY       Current Outpatient Medications:  .  albuterol (PROVENTIL HFA;VENTOLIN HFA) 108 (90 Base) MCG/ACT inhaler,  Inhale 2 puffs into the lungs every 6 (six) hours as needed for wheezing or shortness of breath., Disp: 1 Inhaler, Rfl: 0 .  Blood Glucose Monitoring Suppl (ACCU-CHEK AVIVA PLUS) w/Device KIT, TEST FASTING BLOOD SUGAR ONE TIME DAILY, Disp: 1 kit, Rfl: 0 .  Blood Glucose Monitoring Suppl (ACCU-CHEK AVIVA) device, Use as instructed, Disp: 1 each, Rfl: 0 .  glucose blood (ACCU-CHEK AVIVA) test strip, Check sugar twice daily DX E 11.9, Disp: 100 each, Rfl: 3 .  IRON PO, Take 45 mg by mouth., Disp: , Rfl:  .  Lancets Misc. (ACCU-CHEK FASTCLIX LANCET) KIT, Test fasting blood sugar daily., Disp: 100 kit, Rfl: 3 .  nadolol (CORGARD) 20 MG tablet, Take 1 tablet (20 mg total) by mouth daily. Hold if BP is low SBP<90 or feeling dizzy, Disp: 30 tablet, Rfl: 2 .  pantoprazole (PROTONIX) 40 MG tablet, Take 1 tablet (40 mg total) by mouth 2 (two) times daily., Disp: 60 tablet, Rfl: 2 .  spironolactone (ALDACTONE) 50 MG tablet, Take 1 tablet (50 mg total) by mouth daily., Disp: 30 tablet, Rfl: 2 .  TURMERIC CURCUMIN PO, Take by mouth., Disp: , Rfl:  .  Vitamin D, Ergocalciferol, (DRISDOL) 50000 units CAPS capsule, TAKE 1 CAPSULE EVERY WEEK, Disp: 12 capsule, Rfl: 1 .  furosemide (LASIX) 20 MG tablet, Take 1 tablet (20 mg total) by mouth daily., Disp: 90 tablet, Rfl: 0 .  metFORMIN (GLUCOPHAGE) 500 MG tablet, TAKE 1 TABLET TWICE DAILY WITH MEALS (Patient not taking: Reported on 08/07/2017), Disp: 180 tablet, Rfl: 1   Family History  Problem Relation Age of Onset  . Dementia Mother   . Alcohol abuse Father   . Cancer Sister        lung  . Lymphoma Sister   . Lymphoma Sister      Social History   Tobacco Use  . Smoking status: Former Smoker    Packs/day: 0.50    Years: 6.00    Pack years: 3.00    Types: Cigarettes    Last attempt to quit: 05/08/1987    Years since quitting: 30.2  . Smokeless tobacco: Never Used  Substance Use Topics  . Alcohol use: No  . Drug use: No    Allergies as of 08/07/2017  - Review Complete 08/07/2017  Allergen Reaction Noted  . Codeine Other (See Comments) 10/14/2014  . Hydrocodone-acetaminophen Other (See Comments) 10/14/2014  . Nitrofurantoin Other (See Comments) 11/05/2014  . Nitrofurantoin monohyd macro  10/14/2014  . Penicillins  10/14/2014  . Sulfa antibiotics Other (See Comments) 10/14/2014    Review of Systems:    All systems reviewed and negative except where noted in HPI.   Physical Exam:  BP 118/72   Pulse 64   Temp (!) 97.4 F (36.3 C) (Oral)   Ht 5' 1"  (1.549 m)   Wt 177 lb 12.8 oz (80.6 kg)   BMI 33.60 kg/m  No LMP recorded. Patient has had a hysterectomy.  General:   Alert,  Well-developed, well-nourished, pleasant and cooperative in NAD Head:  Normocephalic and atraumatic. Eyes:  Sclera clear, no icterus.   Conjunctiva pink. Ears:  Normal auditory acuity. Nose:  No deformity, discharge, or lesions. Mouth:  No deformity or lesions,oropharynx pink & moist. Neck:  Supple; no masses or thyromegaly. Lungs:  Respirations even and unlabored.  Clear throughout to auscultation.   No wheezes, crackles, or rhonchi. No acute distress. Heart:  Regular rate and rhythm; no murmurs, clicks, rubs, or gallops. Abdomen:  Normal bowel sounds. Soft, non-tender and grossly distended, tympanic to percussion, medium-sized ventral hernia, no hepatosplenomegaly.  No guarding or rebound tenderness.   Rectal: Not performed Msk:  Symmetrical without gross deformities. Good, equal movement & strength bilaterally. Pulses:  Normal pulses noted. Extremities:  No clubbing, 2+ edema.  No cyanosis. Neurologic:  Alert and oriented x3;  grossly normal neurologically. Skin:  Intact without significant lesions or rashes. No jaundice. Psych:  Alert and cooperative. Normal mood and affect.  Imaging Studies: reviewed  Assessment and Plan:   LAKIMA DONA is a 77 y.o. Caucasian female with metabolic syndrome, diabetes, hypertension, hyperlipidemia seen in  consultation for decompensated cirrhosis with ascites and variceal bleed.  Decompensated cirrhosis: probably secondary to NASH in the setting of metabolic syndrome Hepatitis B and C negative. Normal ferritin levels Portal hypertension: manifested as ascites, thrombocytopenia, esophageal varices Volume overload: status post therapeutic paracentesis on 07/13/2017. Repeat ultrasound revealed minimal ascites on 07/19/2017. She is on spironolactone 51m, I have started her on Lasix 20 mg daily and educated her about low-sodium diet, education material provided Esophageal varices: status post banding on 07/12/2017. Repeat EGD for variceal surveillance and banding in 1-2 weeks. Continue nadolol Anemia/thrombocytopenia/coagulopathy: mild and stable PSE: None HCC surveillance: normal AFP, recent ultrasound with no liver lesions HRS: None She needs to follow up with PCP about further management of diabetes as she stopped metformin since last hospitalization. Will send a note to her primary care provider  Abdominal distention: Recommend CT abdomen with contrast for further evaluation  History of tubular adenomas in:, Last colonoscopy in 2016 Recommend repeat colonoscopy when she is more stable, in next 3 months  Follow up in 4 weeks   RCephas Darby MD

## 2017-08-09 ENCOUNTER — Telehealth: Payer: Self-pay

## 2017-08-09 ENCOUNTER — Ambulatory Visit
Admission: RE | Admit: 2017-08-09 | Discharge: 2017-08-09 | Disposition: A | Discharge status: Home / Self Care | Payer: Medicare HMO | Source: Ambulatory Visit | Attending: Gastroenterology | Admitting: Gastroenterology

## 2017-08-09 DIAGNOSIS — K8020 Calculus of gallbladder without cholecystitis without obstruction: Secondary | ICD-10-CM | POA: Diagnosis not present

## 2017-08-09 DIAGNOSIS — R188 Other ascites: Secondary | ICD-10-CM | POA: Diagnosis not present

## 2017-08-09 DIAGNOSIS — R161 Splenomegaly, not elsewhere classified: Secondary | ICD-10-CM | POA: Diagnosis not present

## 2017-08-09 DIAGNOSIS — R1110 Vomiting, unspecified: Secondary | ICD-10-CM | POA: Diagnosis not present

## 2017-08-09 DIAGNOSIS — R918 Other nonspecific abnormal finding of lung field: Secondary | ICD-10-CM | POA: Diagnosis not present

## 2017-08-09 DIAGNOSIS — K7689 Other specified diseases of liver: Secondary | ICD-10-CM | POA: Insufficient documentation

## 2017-08-09 DIAGNOSIS — R140 Abdominal distension (gaseous): Secondary | ICD-10-CM | POA: Insufficient documentation

## 2017-08-09 MED ORDER — IOPAMIDOL (ISOVUE-300) INJECTION 61%
50.0000 mL | Freq: Once | INTRAVENOUS | Status: AC | PRN
  Administered 2017-08-09: 100 mL via INTRAVENOUS

## 2017-08-09 NOTE — Telephone Encounter (Signed)
-----   Message from Lin Landsman, MD sent at 08/09/2017  4:01 PM EDT ----- Jennifer [name]  She has large volume ascites. Recommend to increase Lasix to 40 mg and spironolactone 100 mg daily. Recheck BMP and magnesium in 1 week. Clinic follow up in 2 weeks  Thanks Rohini Vanga

## 2017-08-09 NOTE — Telephone Encounter (Signed)
Patient has been asked to increase Lasix to 40 mg daily from 20 mg Spironolactone increase from 50 to 100 recheck BMP and MG in one week follow up with Dr. Marius Ditch in 2 weeks.  Thanks [employer]

## 2017-08-10 ENCOUNTER — Telehealth: Payer: Self-pay | Admitting: Gastroenterology

## 2017-08-10 NOTE — Progress Notes (Signed)
Have prescriptions been adjusted? Also, would you please advise patient we should recheck CT of lungs in 6 months to evaluate stability of ground glass lesion in her lungs?

## 2017-08-10 NOTE — Telephone Encounter (Signed)
Patient called and stated that Dr. Marius Ditch told her to double up on some of her medication but [facility] [address] her it was like taking a new medication and they need a new script. She will need this TODAY as Sunday she will be out of medication. Please call patient today

## 2017-08-10 NOTE — Telephone Encounter (Signed)
New rxs sent to pharmacy.  Thanks [employer]

## 2017-08-14 ENCOUNTER — Ambulatory Visit: Payer: Self-pay

## 2017-08-14 ENCOUNTER — Telehealth: Payer: Self-pay

## 2017-08-14 NOTE — Telephone Encounter (Signed)
Called pt to see about r/s missed AWV that was scheduled for today at 2. Pt states she completely forgot and not to r/s yet bc she has to go back to the hospital Thursday for an EGD. Advised pt I would wait an CB next week if we haven't heard back from her. Pt does plan to r/s AWV prior to f/u apt with Simona Huh on 08/30/17. -MM

## 2017-08-16 ENCOUNTER — Encounter
Admission: RE | Disposition: A | Discharge status: Home / Self Care | Payer: Self-pay | Source: Ambulatory Visit | Attending: Gastroenterology

## 2017-08-16 ENCOUNTER — Ambulatory Visit
Admission: RE | Admit: 2017-08-16 | Discharge: 2017-08-16 | Disposition: A | Discharge status: Home / Self Care | Payer: Medicare HMO | Source: Ambulatory Visit | Attending: Gastroenterology | Admitting: Gastroenterology

## 2017-08-16 ENCOUNTER — Encounter: Payer: Self-pay | Admitting: Anesthesiology

## 2017-08-16 ENCOUNTER — Ambulatory Visit: Payer: Medicare HMO | Admitting: Anesthesiology

## 2017-08-16 DIAGNOSIS — Z880 Allergy status to penicillin: Secondary | ICD-10-CM | POA: Insufficient documentation

## 2017-08-16 DIAGNOSIS — I8500 Esophageal varices without bleeding: Secondary | ICD-10-CM

## 2017-08-16 DIAGNOSIS — K219 Gastro-esophageal reflux disease without esophagitis: Secondary | ICD-10-CM | POA: Diagnosis not present

## 2017-08-16 DIAGNOSIS — I8501 Esophageal varices with bleeding: Secondary | ICD-10-CM | POA: Insufficient documentation

## 2017-08-16 DIAGNOSIS — F1099 Alcohol use, unspecified with unspecified alcohol-induced disorder: Secondary | ICD-10-CM | POA: Diagnosis not present

## 2017-08-16 DIAGNOSIS — Z882 Allergy status to sulfonamides status: Secondary | ICD-10-CM | POA: Diagnosis not present

## 2017-08-16 DIAGNOSIS — M1990 Unspecified osteoarthritis, unspecified site: Secondary | ICD-10-CM | POA: Diagnosis not present

## 2017-08-16 DIAGNOSIS — Z885 Allergy status to narcotic agent status: Secondary | ICD-10-CM | POA: Insufficient documentation

## 2017-08-16 DIAGNOSIS — Z801 Family history of malignant neoplasm of trachea, bronchus and lung: Secondary | ICD-10-CM | POA: Insufficient documentation

## 2017-08-16 DIAGNOSIS — E785 Hyperlipidemia, unspecified: Secondary | ICD-10-CM | POA: Diagnosis not present

## 2017-08-16 DIAGNOSIS — K760 Fatty (change of) liver, not elsewhere classified: Secondary | ICD-10-CM | POA: Diagnosis not present

## 2017-08-16 DIAGNOSIS — Z807 Family history of other malignant neoplasms of lymphoid, hematopoietic and related tissues: Secondary | ICD-10-CM | POA: Insufficient documentation

## 2017-08-16 DIAGNOSIS — Z7984 Long term (current) use of oral hypoglycemic drugs: Secondary | ICD-10-CM | POA: Diagnosis not present

## 2017-08-16 DIAGNOSIS — Z86010 Personal history of colonic polyps: Secondary | ICD-10-CM | POA: Insufficient documentation

## 2017-08-16 DIAGNOSIS — I10 Essential (primary) hypertension: Secondary | ICD-10-CM | POA: Insufficient documentation

## 2017-08-16 DIAGNOSIS — Z7951 Long term (current) use of inhaled steroids: Secondary | ICD-10-CM | POA: Insufficient documentation

## 2017-08-16 DIAGNOSIS — Z888 Allergy status to other drugs, medicaments and biological substances status: Secondary | ICD-10-CM | POA: Diagnosis not present

## 2017-08-16 DIAGNOSIS — Z87891 Personal history of nicotine dependence: Secondary | ICD-10-CM | POA: Insufficient documentation

## 2017-08-16 DIAGNOSIS — G709 Myoneural disorder, unspecified: Secondary | ICD-10-CM | POA: Insufficient documentation

## 2017-08-16 DIAGNOSIS — Z820 Family history of epilepsy and other diseases of the nervous system: Secondary | ICD-10-CM | POA: Insufficient documentation

## 2017-08-16 DIAGNOSIS — K3189 Other diseases of stomach and duodenum: Secondary | ICD-10-CM | POA: Insufficient documentation

## 2017-08-16 DIAGNOSIS — Z79899 Other long term (current) drug therapy: Secondary | ICD-10-CM | POA: Insufficient documentation

## 2017-08-16 DIAGNOSIS — Z90710 Acquired absence of both cervix and uterus: Secondary | ICD-10-CM | POA: Insufficient documentation

## 2017-08-16 DIAGNOSIS — Z811 Family history of alcohol abuse and dependence: Secondary | ICD-10-CM | POA: Insufficient documentation

## 2017-08-16 DIAGNOSIS — J45909 Unspecified asthma, uncomplicated: Secondary | ICD-10-CM | POA: Diagnosis not present

## 2017-08-16 DIAGNOSIS — I8510 Secondary esophageal varices without bleeding: Secondary | ICD-10-CM | POA: Diagnosis not present

## 2017-08-16 DIAGNOSIS — E119 Type 2 diabetes mellitus without complications: Secondary | ICD-10-CM | POA: Diagnosis not present

## 2017-08-16 DIAGNOSIS — K766 Portal hypertension: Secondary | ICD-10-CM | POA: Diagnosis not present

## 2017-08-16 HISTORY — PX: ESOPHAGOGASTRODUODENOSCOPY (EGD) WITH PROPOFOL: SHX5813

## 2017-08-16 LAB — BASIC METABOLIC PANEL
ANION GAP: 6 (ref 5–15)
BUN: 11 mg/dL (ref 6–20)
CHLORIDE: 100 mmol/L — AB (ref 101–111)
CO2: 30 mmol/L (ref 22–32)
CREATININE: 0.78 mg/dL (ref 0.44–1.00)
Calcium: 9.1 mg/dL (ref 8.9–10.3)
GFR calc non Af Amer: >60 mL/min (ref >60)
Glucose, Bld: 119 mg/dL — ABNORMAL HIGH (ref 65–99)
POTASSIUM: 3.9 mmol/L (ref 3.5–5.1)
Sodium: 136 mmol/L (ref 135–145)

## 2017-08-16 LAB — MAGNESIUM: Magnesium: 1.5 mg/dL — ABNORMAL LOW (ref 1.7–2.4)

## 2017-08-16 LAB — GLUCOSE, CAPILLARY: Glucose-Capillary: 104 mg/dL — ABNORMAL HIGH (ref 65–99)

## 2017-08-16 SURGERY — ESOPHAGOGASTRODUODENOSCOPY (EGD) WITH PROPOFOL
Anesthesia: General

## 2017-08-16 MED ORDER — SODIUM CHLORIDE 0.9 % IV SOLN
INTRAVENOUS | Status: DC
  Administered 2017-08-16: 10:00:00 via INTRAVENOUS

## 2017-08-16 MED ORDER — LIDOCAINE HCL (CARDIAC) 20 MG/ML IV SOLN
INTRAVENOUS | Status: DC | PRN
  Administered 2017-08-16: 50 mg via INTRAVENOUS

## 2017-08-16 MED ORDER — PROPOFOL 500 MG/50ML IV EMUL
INTRAVENOUS | Status: AC
  Filled 2017-08-16: qty 50

## 2017-08-16 MED ORDER — PROPOFOL 10 MG/ML IV BOLUS
INTRAVENOUS | Status: DC | PRN
  Administered 2017-08-16: 50 mg via INTRAVENOUS
  Administered 2017-08-16: 20 mg via INTRAVENOUS
  Administered 2017-08-16: 30 mg via INTRAVENOUS

## 2017-08-16 MED ORDER — LIDOCAINE HCL (PF) 2 % IJ SOLN
INTRAMUSCULAR | Status: AC
  Filled 2017-08-16: qty 10

## 2017-08-16 MED ORDER — PROPOFOL 500 MG/50ML IV EMUL
INTRAVENOUS | Status: DC | PRN
  Administered 2017-08-16: 175 ug/kg/min via INTRAVENOUS

## 2017-08-16 NOTE — Anesthesia Postprocedure Evaluation (Signed)
Anesthesia Post Note  Patient: CHANNELLE BOTTGER  Procedure(s) Performed: ESOPHAGOGASTRODUODENOSCOPY (EGD) WITH PROPOFOL (N/A )  Patient location during evaluation: Endoscopy Anesthesia Type: General Level of consciousness: awake and alert Pain management: pain level controlled Vital Signs Assessment: post-procedure vital signs reviewed and stable Respiratory status: spontaneous breathing, nonlabored ventilation, respiratory function stable and patient connected to nasal cannula oxygen Cardiovascular status: blood pressure returned to baseline and stable Postop Assessment: no apparent nausea or vomiting Anesthetic complications: no     Last Vitals:  Vitals:   08/16/17 1117 08/16/17 1136  BP: (!) 127/55 127/60  Pulse: 72   Resp: (!) 31   Temp: 36.4 C   SpO2: 95% 96%    Last Pain:  Vitals:   08/16/17 1136  TempSrc:   PainSc: 0-No pain                 Crescencio Jozwiak S

## 2017-08-16 NOTE — Anesthesia Procedure Notes (Signed)
Date/Time: 08/16/2017 10:47 AM Performed by: Johnna Acosta, CRNA Pre-anesthesia Checklist: Patient identified, Emergency Drugs available, Suction available, Patient being monitored and Timeout performed Patient Re-evaluated:Patient Re-evaluated prior to induction Oxygen Delivery Method: Nasal cannula Preoxygenation: Pre-oxygenation with 100% oxygen

## 2017-08-16 NOTE — Anesthesia Post-op Follow-up Note (Signed)
Anesthesia QCDR form completed.        

## 2017-08-16 NOTE — Transfer of Care (Signed)
Immediate Anesthesia Transfer of Care Note  Patient: Jennifer [name]  Procedure(s) Performed: ESOPHAGOGASTRODUODENOSCOPY (EGD) WITH PROPOFOL (N/A )  Patient Location: PACU  Anesthesia Type:General  Level of Consciousness: sedated  Airway & Oxygen Therapy: Patient Spontanous Breathing and Patient connected to nasal cannula oxygen  Post-op Assessment: Report given to RN and Post -op Vital signs reviewed and stable  Post vital signs: Reviewed and stable  Last Vitals:  Vitals Value Taken Time  BP 127/55 08/16/2017 11:17 AM  Temp 36.4 C 08/16/2017 11:17 AM  Pulse 72 08/16/2017 11:17 AM  Resp 31 08/16/2017 11:17 AM  SpO2 95 % 08/16/2017 11:17 AM    Last Pain:  Vitals:   08/16/17 1117  TempSrc: Tympanic  PainSc:          Complications: No apparent anesthesia complications

## 2017-08-16 NOTE — Telephone Encounter (Signed)
Spoke with pt who states that she had a procedure this AM and is resting right now. Asked if I could CB next week to r/s missed AWV. Pt states that would be fine. Pt needs to be scheduled before 08/30/17. Note made.  -MM

## 2017-08-16 NOTE — Anesthesia Preprocedure Evaluation (Signed)
Anesthesia Evaluation  Patient identified by MRN, date of birth, ID band Patient awake    Reviewed: Allergy & Precautions, NPO status , Patient's Chart, lab work & pertinent test results, reviewed documented beta blocker date and time   Airway Mallampati: II  TM Distance: >3 FB     Dental  (+) Chipped   Pulmonary asthma , former smoker,           Cardiovascular hypertension, Pt. on medications and Pt. on home beta blockers      Neuro/Psych  Neuromuscular disease    GI/Hepatic GERD  ,(+) Hepatitis -  Endo/Other  diabetes, Type 2  Renal/GU      Musculoskeletal  (+) Arthritis ,   Abdominal   Peds  Hematology   Anesthesia Other Findings   Reproductive/Obstetrics                             Anesthesia Physical Anesthesia Plan  ASA: III  Anesthesia Plan: General   Post-op Pain Management:    Induction: Intravenous  PONV Risk Score and Plan:   Airway Management Planned:   Additional Equipment:   Intra-op Plan:   Post-operative Plan:   Informed Consent: I have reviewed the patients History and Physical, chart, labs and discussed the procedure including the risks, benefits and alternatives for the proposed anesthesia with the patient or authorized representative who has indicated his/her understanding and acceptance.     Plan Discussed with: CRNA  Anesthesia Plan Comments:         Anesthesia Quick Evaluation

## 2017-08-16 NOTE — Op Note (Signed)
[facility] Gastroenterology Patient Name: Jennifer [name] Procedure Date: 08/16/2017 10:45 AM MRN: [id number] Account #: [id number] Date of Birth: [birth year] Admit Type: Outpatient Age: 77 Room: [facility] ENDO ROOM 2 Gender: Female Note Status: Finalized Procedure:            Upper GI endoscopy Indications:          For therapy of esophageal varices Providers:            Lin Landsman MD, MD Referring MD:         Vickki Muff. Chrismon, MD (Referring MD) Medicines:            Monitored Anesthesia Care Complications:        No immediate complications. Estimated blood loss: None. Procedure:            Pre-Anesthesia Assessment:                       - Prior to the procedure, a History and Physical was                        performed, and patient medications and allergies were                        reviewed. The patient is competent. The risks and                        benefits of the procedure and the sedation options and                        risks were discussed with the patient. All questions                        were answered and informed consent was obtained.                        Patient identification and proposed procedure were                        verified by the physician, the nurse, the                        anesthesiologist, the anesthetist and the technician in                        the pre-procedure area in the procedure room in the                        endoscopy suite. Mental Status Examination: alert and                        oriented. Airway Examination: normal oropharyngeal                        airway and neck mobility. Respiratory Examination:                        clear to auscultation. CV Examination: normal.                        Prophylactic Antibiotics: The patient does not require  prophylactic antibiotics. Prior Anticoagulants: The                        patient has taken no previous anticoagulant or                   antiplatelet agents. ASA Grade Assessment: III - A                        patient with severe systemic disease. After reviewing                        the risks and benefits, the patient was deemed in                        satisfactory condition to undergo the procedure. The                        anesthesia plan was to use monitored anesthesia care                        (MAC). Immediately prior to administration of                        medications, the patient was re-assessed for adequacy                        to receive sedatives. The heart rate, respiratory rate,                        oxygen saturations, blood pressure, adequacy of                        pulmonary ventilation, and response to care were                        monitored throughout the procedure. The physical status                        of the patient was re-assessed after the procedure.                       After obtaining informed consent, the endoscope was                        passed under direct vision. Throughout the procedure,                        the patient's blood pressure, pulse, and oxygen                        saturations were monitored continuously. The Endoscope                        was introduced through the mouth, and advanced to the                        second part of duodenum. The upper GI endoscopy was                        accomplished  without difficulty. The patient tolerated                        the procedure well. Findings:      The duodenal bulb and second portion of the duodenum were normal.      Severe portal hypertensive gastropathy was found in the entire examined       stomach.      Four columns of non-bleeding large (> 5 mm) varices were found in the       middle third of the esophagus and in the lower third of the esophagus,       38 cm from the incisors. Stigmata of recent bleeding were evident and       red wale signs were present. Evidence of partial  eradication was       visible. Four bands were successfully placed with incomplete eradication       of varices. There was no bleeding during, and at the end, of the       procedure. Impression:           - Normal duodenal bulb and second portion of the                        duodenum.                       - Portal hypertensive gastropathy.                       - Recently bleeding large (> 5 mm) esophageal varices.                        Incompletely eradicated. Banded.                       - No specimens collected. Recommendation:       - Discharge patient to home.                       - Resume previous diet today.                       - Continue present medications.                       - Return to my office as previously scheduled.                       - Repeat upper endoscopy in 4 weeks for endoscopic band                        ligation.                       - Protonix 26m daily for 2weeks Procedure Code(s):    --- Professional ---                       [id number] Esophagogastroduodenoscopy, flexible, transoral;                        with band ligation of esophageal/gastric varices Diagnosis Code(s):    --- Professional ---  K76.6, Portal hypertension                       K31.89, Other diseases of stomach and duodenum                       I85.01, Esophageal varices with bleeding CPT copyright 2017 American Medical Association. All rights reserved. The codes documented in this report are preliminary and upon coder review may  be revised to meet current compliance requirements. Dr. Ulyess Mort Lin Landsman MD, MD 08/16/2017 11:13:06 AM This report has been signed electronically. Number of Addenda: 0 Note Initiated On: 08/16/2017 10:45 AM      [facility]

## 2017-08-16 NOTE — H&P (Signed)
Cephas Darby, MD [address]  [place]  [place], [place] [address]  Main: [phone]  Fax: [phone] Pager: [phone]  Primary Care Physician:  Margo Common, PA Primary Gastroenterologist:  Dr. Cephas Darby  Pre-Procedure History & Physical: HPI:  Jennifer [name] is a 77 y.o. female is here for an endoscopy.   Past Medical History:  Diagnosis Date  . Allergy   . Arthritis   . Asthma   . Colon polyp   . Fatty liver   . GERD (gastroesophageal reflux disease)   . Hyperlipidemia   . Hypertension   . Motion sickness    boats    Past Surgical History:  Procedure Laterality Date  . ABDOMINAL HYSTERECTOMY  1978  . COLONOSCOPY WITH PROPOFOL N/A 02/01/2015   Procedure: COLONOSCOPY WITH PROPOFOL;  Surgeon: Lucilla Lame, MD;  Location: [facility];  Service: Endoscopy;  Laterality: N/A;  Diabetic - oral meds  . ESOPHAGOGASTRODUODENOSCOPY (EGD) WITH PROPOFOL N/A 07/12/2017   Procedure: ESOPHAGOGASTRODUODENOSCOPY (EGD) WITH PROPOFOL;  Surgeon: Lin Landsman, MD;  Location: [facility];  Service: Gastroenterology;  Laterality: N/A;  . POLYPECTOMY  02/01/2015   Procedure: POLYPECTOMY;  Surgeon: Lucilla Lame, MD;  Location: [facility];  Service: Endoscopy;;  . TONSILLECTOMY AND ADENOIDECTOMY      Prior to Admission medications   Medication Sig Start Date End Date Taking? Authorizing Provider  albuterol (PROVENTIL HFA;VENTOLIN HFA) 108 (90 Base) MCG/ACT inhaler Inhale 2 puffs into the lungs every 6 (six) hours as needed for wheezing or shortness of breath. 03/30/17  Yes Chrismon, Vickki Muff, PA  furosemide (LASIX) 20 MG tablet Take 1 tablet (20 mg total) by mouth daily. Patient taking differently: Take 20 mg by mouth 2 (two) times daily.  08/07/17 11/05/17 Yes Vanga, Tally Due, MD  glucose blood (ACCU-CHEK AVIVA) test strip Check sugar twice daily DX E 11.9 01/02/17  Yes Chrismon, Vickki Muff, PA  metFORMIN (GLUCOPHAGE) 500 MG tablet TAKE 1 TABLET  TWICE DAILY WITH MEALS 04/23/17  Yes Chrismon, Vickki Muff, PA  nadolol (CORGARD) 20 MG tablet Take 1 tablet (20 mg total) by mouth daily. Hold if BP is low SBP<90 or feeling dizzy 07/15/17  Yes Gladstone Lighter, MD  pantoprazole (PROTONIX) 40 MG tablet Take 1 tablet (40 mg total) by mouth 2 (two) times daily. 07/15/17 10/13/17 Yes Gladstone Lighter, MD  spironolactone (ALDACTONE) 50 MG tablet Take 1 tablet (50 mg total) by mouth daily. 07/16/17  Yes Gladstone Lighter, MD  Vitamin D, Ergocalciferol, (DRISDOL) 50000 units CAPS capsule TAKE 1 CAPSULE EVERY WEEK 05/10/17  Yes Chrismon, Vickki Muff, PA  Blood Glucose Monitoring Suppl (ACCU-CHEK AVIVA PLUS) w/Device KIT TEST FASTING BLOOD SUGAR ONE TIME DAILY 07/16/17   Chrismon, Vickki Muff, PA  Blood Glucose Monitoring Suppl (ACCU-CHEK AVIVA) device Use as instructed 01/02/17 01/02/18  Chrismon, Vickki Muff, PA  IRON PO Take 45 mg by mouth.    [provider]  Lancets Misc. (ACCU-CHEK FASTCLIX LANCET) KIT Test fasting blood sugar daily. 12/05/15   Chrismon, Vickki Muff, PA  TURMERIC CURCUMIN PO Take by mouth.    [provider]    Allergies as of 08/07/2017 - Review Complete 08/07/2017  Allergen Reaction Noted  . Codeine Other (See Comments) 10/14/2014  . Hydrocodone-acetaminophen Other (See Comments) 10/14/2014  . Nitrofurantoin Other (See Comments) 11/05/2014  . Nitrofurantoin monohyd macro  10/14/2014  . Penicillins  10/14/2014  . Sulfa antibiotics Other (See Comments) 10/14/2014    Family History  Problem Relation  Age of Onset  . Dementia Mother   . Alcohol abuse Father   . Cancer Sister        lung  . Lymphoma Sister   . Lymphoma Sister     Social History   Socioeconomic History  . Marital status: Widowed    Spouse name: Not on file  . Number of children: Not on file  . Years of education: Not on file  . Highest education level: Not on file  Occupational History  . Not on file  Social Needs  . Financial resource strain: Not  on file  . Food insecurity:    Worry: Not on file    Inability: Not on file  . Transportation needs:    Medical: Not on file    Non-medical: Not on file  Tobacco Use  . Smoking status: Former Smoker    Packs/day: 0.50    Years: 6.00    Pack years: 3.00    Types: Cigarettes    Last attempt to quit: 05/08/1987    Years since quitting: 30.2  . Smokeless tobacco: Never Used  Substance and Sexual Activity  . Alcohol use: No  . Drug use: No  . Sexual activity: Not on file  Lifestyle  . Physical activity:    Days per week: Not on file    Minutes per session: Not on file  . Stress: Not on file  Relationships  . Social connections:    Talks on phone: Not on file    Gets together: Not on file    Attends religious service: Not on file    Active member of club or organization: Not on file    Attends meetings of clubs or organizations: Not on file    Relationship status: Not on file  . Intimate partner violence:    Fear of current or ex partner: Not on file    Emotionally abused: Not on file    Physically abused: Not on file    Forced sexual activity: Not on file  Other Topics Concern  . Not on file  Social History Narrative  . Not on file    Review of Systems: See HPI, otherwise negative ROS  Physical Exam: BP 122/71   Pulse (!) 59   Temp (!) 96.8 F (36 C) (Tympanic)   Resp 16   Ht 5' 1"  (1.549 m)   Wt 165 lb (74.8 kg)   SpO2 99%   BMI 31.18 kg/m  General:   Alert,  pleasant and cooperative in NAD Head:  Normocephalic and atraumatic. Neck:  Supple; no masses or thyromegaly. Lungs:  Clear throughout to auscultation.    Heart:  Regular rate and rhythm. Abdomen:  Soft, nontender and nondistended. Normal bowel sounds, without guarding, and without rebound.   Neurologic:  Alert and  oriented x4;  grossly normal neurologically.  Impression/Plan: Jennifer [name] is here for an endoscopy to be performed for variceal ligation  Risks, benefits, limitations, and  alternatives regarding  endoscopy have been reviewed with the patient.  Questions have been answered.  All parties agreeable.   Sherri Sear, MD  08/16/2017, 11:14 AM

## 2017-08-21 NOTE — Telephone Encounter (Signed)
Scheduled AWV for 08/23/17 @ 10 AM. -MM

## 2017-08-23 ENCOUNTER — Ambulatory Visit (INDEPENDENT_AMBULATORY_CARE_PROVIDER_SITE_OTHER): Payer: Medicare HMO

## 2017-08-23 VITALS — BP 104/60 | HR 68 | Temp 98.2°F | Ht 61.0 in | Wt 158.8 lb

## 2017-08-23 DIAGNOSIS — Z0000 Encounter for general adult medical examination without abnormal findings: Secondary | ICD-10-CM

## 2017-08-23 NOTE — Patient Instructions (Addendum)
Jennifer [name] , Thank you for taking time to come for your Medicare Wellness Visit. I appreciate your ongoing commitment to your health goals. Please review the following plan we discussed and let me know if I can assist you in the future.   Screening recommendations/referrals: Colonoscopy: Up to date Mammogram: Up to date Bone Density: Up to date Recommended yearly ophthalmology/optometry visit for glaucoma screening and checkup Recommended yearly dental visit for hygiene and checkup  Vaccinations: Influenza vaccine: Up to date Pneumococcal vaccine: Up to date Tdap vaccine: Up to date Shingles vaccine: Pt declines today.     Advanced directives: Advance directive discussed with you today. Even though you declined this today please call our office should you change your mind and we can give you the proper paperwork for you to fill out.  Conditions/risks identified: Recommend to start walking 3 days a week for 30 minutes.   Next appointment: 08/30/17 @ 8:40 AM with Jennifer [name].   Preventive Care 5 Years and Older, Female Preventive care refers to lifestyle choices and visits with your health care provider that can promote health and wellness. What does preventive care include?  A yearly physical exam. This is also called an annual well check.  Dental exams once or twice a year.  Routine eye exams. Ask your health care provider how often you should have your eyes checked.  Personal lifestyle choices, including:  Daily care of your teeth and gums.  Regular physical activity.  Eating a healthy diet.  Avoiding tobacco and drug use.  Limiting alcohol use.  Practicing safe sex.  Taking low-dose aspirin every day.  Taking vitamin and mineral supplements as recommended by your health care provider. What happens during an annual well check? The services and screenings done by your health care provider during your annual well check will depend on your age, overall health,  lifestyle risk factors, and family history of disease. Counseling  Your health care provider may ask you questions about your:  Alcohol use.  Tobacco use.  Drug use.  Emotional well-being.  Home and relationship well-being.  Sexual activity.  Eating habits.  History of falls.  Memory and ability to understand (cognition).  Work and work Statistician.  Reproductive health. Screening  You may have the following tests or measurements:  Height, weight, and BMI.  Blood pressure.  Lipid and cholesterol levels. These may be checked every 5 years, or more frequently if you are over 60 years old.  Skin check.  Lung cancer screening. You may have this screening every year starting at age 36 if you have a 30-pack-year history of smoking and currently smoke or have quit within the past 15 years.  Fecal occult blood test (FOBT) of the stool. You may have this test every year starting at age 16.  Flexible sigmoidoscopy or colonoscopy. You may have a sigmoidoscopy every 5 years or a colonoscopy every 10 years starting at age 65.  Hepatitis C blood test.  Hepatitis B blood test.  Sexually transmitted disease (STD) testing.  Diabetes screening. This is done by checking your blood sugar (glucose) after you have not eaten for a while (fasting). You may have this done every 1-3 years.  Bone density scan. This is done to screen for osteoporosis. You may have this done starting at age 54.  Mammogram. This may be done every 1-2 years. Talk to your health care provider about how often you should have regular mammograms. Talk with your health care provider about your test results,  treatment options, and if necessary, the need for more tests. Vaccines  Your health care provider may recommend certain vaccines, such as:  Influenza vaccine. This is recommended every year.  Tetanus, diphtheria, and acellular pertussis (Tdap, Td) vaccine. You may need a Td booster every 10 years.  Zoster  vaccine. You may need this after age 69.  Pneumococcal 13-valent conjugate (PCV13) vaccine. One dose is recommended after age 74.  Pneumococcal polysaccharide (PPSV23) vaccine. One dose is recommended after age 62. Talk to your health care provider about which screenings and vaccines you need and how often you need them. This information is not intended to replace advice given to you by your health care provider. Make sure you discuss any questions you have with your health care provider. Document Released: 05/21/2015 Document Revised: 01/12/2016 Document Reviewed: 02/23/2015 Elsevier Interactive Patient Education  2017 [place] Prevention in the Home Falls can cause injuries. They can happen to people of all ages. There are many things you can do to make your home safe and to help prevent falls. What can I do on the outside of my home?  Regularly fix the edges of walkways and driveways and fix any cracks.  Remove anything that might make you trip as you walk through a door, such as a raised step or threshold.  Trim any bushes or trees on the path to your home.  Use bright outdoor lighting.  Clear any walking paths of anything that might make someone trip, such as rocks or tools.  Regularly check to see if handrails are loose or broken. Make sure that both sides of any steps have handrails.  Any raised decks and porches should have guardrails on the edges.  Have any leaves, snow, or ice cleared regularly.  Use sand or salt on walking paths during winter.  Clean up any spills in your garage right away. This includes oil or grease spills. What can I do in the bathroom?  Use night lights.  Install grab bars by the toilet and in the tub and shower. Do not use towel bars as grab bars.  Use non-skid mats or decals in the tub or shower.  If you need to sit down in the shower, use a plastic, non-slip stool.  Keep the floor dry. Clean up any water that spills on the  floor as soon as it happens.  Remove soap buildup in the tub or shower regularly.  Attach bath mats securely with double-sided non-slip rug tape.  Do not have throw rugs and other things on the floor that can make you trip. What can I do in the bedroom?  Use night lights.  Make sure that you have a light by your bed that is easy to reach.  Do not use any sheets or blankets that are too big for your bed. They should not hang down onto the floor.  Have a firm chair that has side arms. You can use this for support while you get dressed.  Do not have throw rugs and other things on the floor that can make you trip. What can I do in the kitchen?  Clean up any spills right away.  Avoid walking on wet floors.  Keep items that you use a lot in easy-to-reach places.  If you need to reach something above you, use a strong step stool that has a grab bar.  Keep electrical cords out of the way.  Do not use floor polish or wax that makes floors  slippery. If you must use wax, use non-skid floor wax.  Do not have throw rugs and other things on the floor that can make you trip. What can I do with my stairs?  Do not leave any items on the stairs.  Make sure that there are handrails on both sides of the stairs and use them. Fix handrails that are broken or loose. Make sure that handrails are as long as the stairways.  Check any carpeting to make sure that it is firmly attached to the stairs. Fix any carpet that is loose or worn.  Avoid having throw rugs at the top or bottom of the stairs. If you do have throw rugs, attach them to the floor with carpet tape.  Make sure that you have a light switch at the top of the stairs and the bottom of the stairs. If you do not have them, ask someone to add them for you. What else can I do to help prevent falls?  Wear shoes that:  Do not have high heels.  Have rubber bottoms.  Are comfortable and fit you well.  Are closed at the toe. Do not wear  sandals.  If you use a stepladder:  Make sure that it is fully opened. Do not climb a closed stepladder.  Make sure that both sides of the stepladder are locked into place.  Ask someone to hold it for you, if possible.  Clearly mark and make sure that you can see:  Any grab bars or handrails.  First and last steps.  Where the edge of each step is.  Use tools that help you move around (mobility aids) if they are needed. These include:  Canes.  Walkers.  Scooters.  Crutches.  Turn on the lights when you go into a dark area. Replace any light bulbs as soon as they burn out.  Set up your furniture so you have a clear path. Avoid moving your furniture around.  If any of your floors are uneven, fix them.  If there are any pets around you, be aware of where they are.  Review your medicines with your doctor. Some medicines can make you feel dizzy. This can increase your chance of falling. Ask your doctor what other things that you can do to help prevent falls. This information is not intended to replace advice given to you by your health care provider. Make sure you discuss any questions you have with your health care provider. Document Released: 02/18/2009 Document Revised: 09/30/2015 Document Reviewed: 05/29/2014 Elsevier Interactive Patient Education  2017 [employer].

## 2017-08-23 NOTE — Progress Notes (Addendum)
Subjective:   Jennifer [name] is a 77 y.o. female who presents for Medicare Annual (Subsequent) preventive examination.  Review of Systems:  N/A  Cardiac Risk Factors include: advanced age (>18mn, >>83women);diabetes mellitus;dyslipidemia;hypertension     Objective:     Vitals: BP 104/60 (BP Location: Left Arm)   Pulse 68   Temp 98.2 F (36.8 C) (Oral)   Ht 5' 1"  (1.549 m)   Wt 158 lb 12.8 oz (72 kg)   BMI 30.00 kg/m   Body mass index is 30 kg/m.  Advanced Directives 08/23/2017 08/16/2017 07/18/2017 07/12/2017 07/12/2017 01/17/2017 07/27/2016  Does Patient Have a Medical Advance Directive? No No No No No No No  Type of Advance Directive - - - - - - -  Does patient want to make changes to medical advance directive? - - - - - - Yes (ED - Information included in AVS)  Would patient like information on creating a medical advance directive? No - Patient declined No - Patient declined No - Patient declined No - Patient declined No - Patient declined No - Patient declined -    Tobacco Social History   Tobacco Use  Smoking Status Former Smoker  . Packs/day: 0.50  . Years: 6.00  . Pack years: 3.00  . Types: Cigarettes  . Last attempt to quit: 05/08/1987  . Years since quitting: 30.3  Smokeless Tobacco Never Used     Counseling given: Not Answered   Clinical Intake:  Pre-visit preparation completed: Yes  Pain : No/denies pain Pain Score: 0-No pain     Nutritional Status: BMI > 30  Obese Nutritional Risks: Nausea/ vomitting/ diarrhea(n/v recently due to enlarged esophagus ) Diabetes: Yes(type 2) CBG done?: No Did pt. bring in CBG monitor from home?: No  How often do you need to have someone help you when you read instructions, pamphlets, or other written materials from your doctor or pharmacy?: 1 - Never  Interpreter Needed?: No  Information [facility] :: [facility]  Past Medical History:  Diagnosis Date  . Allergy   . Arthritis   . Asthma   . Colon polyp     . Fatty liver   . GERD (gastroesophageal reflux disease)   . Hyperlipidemia   . Hypertension   . Motion sickness    boats   Past Surgical History:  Procedure Laterality Date  . ABDOMINAL HYSTERECTOMY  1978  . COLONOSCOPY WITH PROPOFOL N/A 02/01/2015   Procedure: COLONOSCOPY WITH PROPOFOL;  Surgeon: DLucilla Lame MD;  [facility]  Service: Endoscopy;  Laterality: N/A;  Diabetic - oral meds  . ESOPHAGOGASTRODUODENOSCOPY (EGD) WITH PROPOFOL N/A 07/12/2017   Procedure: ESOPHAGOGASTRODUODENOSCOPY (EGD) WITH PROPOFOL;  Surgeon: VLin Landsman MD;  [facility]  Service: Gastroenterology;  Laterality: N/A;  . ESOPHAGOGASTRODUODENOSCOPY (EGD) WITH PROPOFOL N/A 08/16/2017   Procedure: ESOPHAGOGASTRODUODENOSCOPY (EGD) WITH PROPOFOL;  Surgeon: VLin Landsman MD;  [facility]  Service: Gastroenterology;  Laterality: N/A;  . POLYPECTOMY  02/01/2015   Procedure: POLYPECTOMY;  Surgeon: DLucilla Lame MD;  [facility] [facility]  Service: Endoscopy;;  . TONSILLECTOMY AND ADENOIDECTOMY     Family History  Problem Relation Age of Onset  . Dementia Mother   . Alcohol abuse Father   . Cancer Sister        lung  . Lymphoma Sister   . Lymphoma Sister    Social History   Socioeconomic History  . Marital status: Widowed    Spouse name: Not on  file  . Number of children: 2  . Years of education: Not on file  . Highest education level: Not on file  Occupational History  . Occupation: retired  Scientific laboratory technician  . Financial resource strain: Not hard at all  . Food insecurity:    Worry: Never true    Inability: Never true  . Transportation needs:    Medical: No    Non-medical: No  Tobacco Use  . Smoking status: Former Smoker    Packs/day: 0.50    Years: 6.00    Pack years: 3.00    Types: Cigarettes    Last attempt to quit: 05/08/1987    Years since quitting: 30.3  . Smokeless tobacco: Never Used  Substance and Sexual Activity  .  Alcohol use: No  . Drug use: No  . Sexual activity: Not on file  Lifestyle  . Physical activity:    Days per week: Not on file    Minutes per session: Not on file  . Stress: Not at all  Relationships  . Social connections:    Talks on phone: Not on file    Gets together: Not on file    Attends religious service: Not on file    Active member of club or organization: Not on file    Attends meetings of clubs or organizations: Not on file    Relationship status: Not on file  Other Topics Concern  . Not on file  Social History Narrative  . Not on file    Outpatient Encounter Medications as of 08/23/2017  Medication Sig  . albuterol (PROVENTIL HFA;VENTOLIN HFA) 108 (90 Base) MCG/ACT inhaler Inhale 2 puffs into the lungs every 6 (six) hours as needed for wheezing or shortness of breath.  . Blood Glucose Monitoring Suppl (ACCU-CHEK AVIVA PLUS) w/Device KIT TEST FASTING BLOOD SUGAR ONE TIME DAILY  . Blood Glucose Monitoring Suppl (ACCU-CHEK AVIVA) device Use as instructed  . furosemide (LASIX) 20 MG tablet Take 1 tablet (20 mg total) by mouth daily.  Marland Kitchen glucose blood (ACCU-CHEK AVIVA) test strip Check sugar twice daily DX E 11.9  . IRON PO Take 45 mg by mouth.  . Lancets Misc. (ACCU-CHEK FASTCLIX LANCET) KIT Test fasting blood sugar daily.  . magnesium oxide (MAG-OX) 400 MG tablet Take 400 mg by mouth 2 (two) times daily.  . nadolol (CORGARD) 20 MG tablet Take 1 tablet (20 mg total) by mouth daily. Hold if BP is low SBP<90 or feeling dizzy  . pantoprazole (PROTONIX) 40 MG tablet Take 1 tablet (40 mg total) by mouth 2 (two) times daily.  Marland Kitchen spironolactone (ALDACTONE) 50 MG tablet Take 1 tablet (50 mg total) by mouth daily. (Patient taking differently: Take 100 mg by mouth daily. )  . Vitamin D, Ergocalciferol, (DRISDOL) 50000 units CAPS capsule TAKE 1 CAPSULE EVERY WEEK  . metFORMIN (GLUCOPHAGE) 500 MG tablet TAKE 1 TABLET TWICE DAILY WITH MEALS (Patient not taking: Reported on 08/23/2017)  .  TURMERIC CURCUMIN PO Take by mouth.   No facility-administered encounter medications on file as of 08/23/2017.     Activities of Daily Living In your present state of health, do you have any difficulty performing the following activities: 08/23/2017 07/13/2017  Hearing? N N  Vision? N N  Difficulty concentrating or making decisions? N N  Walking or climbing stairs? N N  Dressing or bathing? N N  Doing errands, shopping? N N  Preparing Food and eating ? N -  Using the Toilet? N -  In  the past six months, have you accidently leaked urine? N -  Do you have problems with loss of bowel control? N -  Managing your Medications? N -  Managing your Finances? N -  Housekeeping or managing your Housekeeping? N -  Some recent data might be hidden    Patient Care Team: Chrismon, Vickki Muff, PA as PCP - General (Family Medicine) Byrnett, Forest Gleason, MD (General Surgery) Anell Barr, OD as Consulting Physician (Optometry) Cammie Sickle, MD as Consulting Physician (Internal Medicine) Lin Landsman, MD as Consulting Physician (Gastroenterology)    Assessment:   This is a routine wellness examination for [place].  Exercise Activities and Dietary recommendations Current Exercise Habits: The patient does not participate in regular exercise at present(walks dog daily)  Goals    . Exercise 150 minutes per week (moderate activity)    . Exercise 3x per week (30 min per time)     Recommend to start walking 3 days a week for 30 minutes.        Fall Risk Fall Risk  08/23/2017 07/27/2016 07/11/2016 01/18/2015 10/14/2014  Falls in the past year? No No No No No   Is the patient's home free of loose throw rugs in walkways, pet beds, electrical cords, etc?   yes      Grab bars in the bathroom? yes      Handrails on the stairs?   no      Adequate lighting?   yes  Timed Get Up and Go performed: N/A  Depression Screen PHQ 2/9 Scores 08/23/2017 08/23/2017 07/27/2016 07/11/2016  PHQ - 2 Score 0 0 0  0  PHQ- 9 Score 4 - 0 1     Cognitive Function: Pt declined screening today.      6CIT Screen 07/27/2016  What Year? 0 points  What month? 0 points  What time? 0 points  Count back from 20 0 points  Months in reverse 0 points  Repeat phrase 4 points  Total Score 4    Immunization History  Administered Date(s) Administered  . Hepatitis A 07/10/2011, 12/25/2011  . Hepatitis B 07/10/2011, 08/11/2011, 12/25/2011  . Influenza Split 02/24/2010, 03/27/2012  . Influenza, High Dose Seasonal PF 01/18/2015, 02/19/2016, 02/05/2017  . Pneumococcal Conjugate-13 07/13/2014  . Pneumococcal Polysaccharide-23 01/27/2009  . Td 04/08/1996  . Tdap 02/24/2010    Qualifies for Shingles Vaccine? Due for Shingles vaccine. Declined my offer to administer today. Education has been provided regarding the importance of this vaccine. Pt has been advised to call her insurance company to determine her out of pocket expense. Advised she may also receive this vaccine at her local pharmacy or Health Dept. Verbalized acceptance and understanding.  Screening Tests Health Maintenance  Topic Date Due  . HEMOGLOBIN A1C  07/16/2017  . URINE MICROALBUMIN  07/27/2017  . INFLUENZA VACCINE  12/06/2017  . FOOT EXAM  01/16/2018  . COLONOSCOPY  01/31/2018  . OPHTHALMOLOGY EXAM  03/27/2018  . TETANUS/TDAP  02/25/2020  . DEXA SCAN  Completed  . PNA vac Low Risk Adult  Completed    Cancer Screenings: Lung: Low Dose CT Chest recommended if Age 109-80 years, 30 pack-year currently smoking OR have quit w/in 15years. Patient does not qualify. Breast:  Up to date on Mammogram? Yes   Up to date of Bone Density/Dexa? Yes Colorectal: Up to date  Additional Screenings:  Hepatitis C Screening: N/A     Plan:  I have personally reviewed and addressed the Medicare Annual Wellness questionnaire  and have noted the following in the patient's chart:  A. Medical and social history B. Use of alcohol, tobacco or illicit drugs   C. Current medications and supplements D. Functional ability and status E.  Nutritional status F.  Physical activity G. Advance directives H. List of other physicians I.  Hospitalizations, surgeries, and ER visits in previous 12 months J.  [place] such as hearing and vision if needed, cognitive and depression L. Referrals and appointments - none  In addition, I have reviewed and discussed with patient certain preventive protocols, quality metrics, and best practice recommendations. A written personalized care plan for preventive services as well as general preventive health recommendations were provided to patient.  See attached scanned questionnaire for additional information.   Signed,  Fabio Neighbors, LPN Nurse Health Advisor   Nurse Recommendations: Pt needs her Hgb A1c checked at next OV.  Reviewed documentation and recommendations of Nurse Health Advisor and agree with plan. Was available for consultation during screening.

## 2017-08-30 ENCOUNTER — Encounter: Payer: Self-pay | Admitting: Family Medicine

## 2017-08-30 ENCOUNTER — Ambulatory Visit (INDEPENDENT_AMBULATORY_CARE_PROVIDER_SITE_OTHER): Payer: Medicare HMO | Admitting: Family Medicine

## 2017-08-30 VITALS — BP 92/58 | HR 55 | Temp 97.6°F | Wt 156.2 lb

## 2017-08-30 DIAGNOSIS — I10 Essential (primary) hypertension: Secondary | ICD-10-CM

## 2017-08-30 DIAGNOSIS — Z8719 Personal history of other diseases of the digestive system: Secondary | ICD-10-CM | POA: Diagnosis not present

## 2017-08-30 DIAGNOSIS — K7460 Unspecified cirrhosis of liver: Secondary | ICD-10-CM | POA: Diagnosis not present

## 2017-08-30 DIAGNOSIS — E559 Vitamin D deficiency, unspecified: Secondary | ICD-10-CM

## 2017-08-30 DIAGNOSIS — D696 Thrombocytopenia, unspecified: Secondary | ICD-10-CM | POA: Diagnosis not present

## 2017-08-30 DIAGNOSIS — E119 Type 2 diabetes mellitus without complications: Secondary | ICD-10-CM | POA: Diagnosis not present

## 2017-08-30 DIAGNOSIS — R188 Other ascites: Secondary | ICD-10-CM

## 2017-08-30 DIAGNOSIS — E7800 Pure hypercholesterolemia, unspecified: Secondary | ICD-10-CM | POA: Diagnosis not present

## 2017-08-30 LAB — POCT UA - MICROALBUMIN: MICROALBUMIN (UR) POC: 20 mg/L

## 2017-08-30 LAB — POCT GLYCOSYLATED HEMOGLOBIN (HGB A1C): HEMOGLOBIN A1C: 6

## 2017-08-30 NOTE — Progress Notes (Signed)
Patient: Jennifer [name], Female    DOB: Jul 17, 1940, 77 y.o.   MRN: [id number] Visit Date: 08/30/2017  Today's Provider: Vernie Murders, PA   Chief Complaint  Patient presents with  . Annual Exam   Subjective:     Complete Physical Jennifer [name] is a 77 y.o. female. She feels well. She reports no regular exercise, but she walks her 3 dogs daily. She reports she is sleeping well.  ----------------------------------------------------------- Patient had Annual Wellness Exam on 08/23/17  Review of Systems  Constitutional: Negative.   HENT: Negative.   Eyes: Negative.   Respiratory: Negative.   Cardiovascular: Negative.   Gastrointestinal: Negative.   Endocrine: Negative.   Genitourinary: Negative.   Musculoskeletal: Negative.   Skin: Negative.   Allergic/Immunologic: Negative.   Neurological: Negative.   Hematological: Negative.   Psychiatric/Behavioral: Negative.     Social History   Socioeconomic History  . Marital status: Widowed    Spouse name: Not on file  . Number of children: 2  . Years of education: Not on file  . Highest education level: Not on file  Occupational History  . Occupation: retired  Scientific laboratory technician  . Financial resource strain: Not hard at all  . Food insecurity:    Worry: Never true    Inability: Never true  . Transportation needs:    Medical: No    Non-medical: No  Tobacco Use  . Smoking status: Former Smoker    Packs/day: 0.50    Years: 6.00    Pack years: 3.00    Types: Cigarettes    Last attempt to quit: 05/08/1987    Years since quitting: 30.3  . Smokeless tobacco: Never Used  Substance and Sexual Activity  . Alcohol use: No  . Drug use: No  . Sexual activity: Not on file  Lifestyle  . Physical activity:    Days per week: Not on file    Minutes per session: Not on file  . Stress: Not at all  Relationships  . Social connections:    Talks on phone: Not on file    Gets together: Not on file    Attends religious  service: Not on file    Active member of club or organization: Not on file    Attends meetings of clubs or organizations: Not on file    Relationship status: Not on file  . Intimate partner violence:    Fear of current or ex partner: Not on file    Emotionally abused: Not on file    Physically abused: Not on file    Forced sexual activity: Not on file  Other Topics Concern  . Not on file  Social History Narrative  . Not on file    Past Medical History:  Diagnosis Date  . Allergy   . Arthritis   . Asthma   . Colon polyp   . Fatty liver   . GERD (gastroesophageal reflux disease)   . Hyperlipidemia   . Hypertension   . Motion sickness    boats     Patient Active Problem List   Diagnosis Date Noted  . Acute upper GI bleed 07/12/2017  . Other cirrhosis of liver ([place])   . Acute right-sided low back pain 01/31/2017  . Carpal tunnel syndrome on right 09/28/2016  . Obesity (BMI 30.0-34.9) 09/20/2016  . Upper respiratory infection 03/17/2016  . Thrombocytopenia ([place]) 12/29/2015  . Primary osteoarthritis of left knee 09/30/2015  . Special screening for malignant neoplasms,  colon   . Benign neoplasm of ascending colon   . Benign neoplasm of transverse colon   . Benign neoplasm of sigmoid colon   . Arthritis 10/21/2014  . Allergic rhinitis 10/14/2014  . Airway hyperreactivity 10/14/2014  . Back ache 10/14/2014  . Bell palsy 10/14/2014  . Controlled diabetes mellitus type II without complication ([place]) 24/58/0998  . Elevation of level of transaminase or lactic acid dehydrogenase (LDH) 10/14/2014  . Calculus of gallbladder 10/14/2014  . HLD (hyperlipidemia) 10/14/2014  . BP (high blood pressure) 10/14/2014  . Gonalgia 10/14/2014  . Benign neoplasm of soft tissues 10/14/2014  . NASH (nonalcoholic steatohepatitis) 10/14/2014  . Adiposity 10/14/2014  . Esophagitis, reflux 10/14/2014  . Enlargement of spleen 10/14/2014  . Change in blood platelet count 10/14/2014  .  Avitaminosis D 10/14/2014    Past Surgical History:  Procedure Laterality Date  . ABDOMINAL HYSTERECTOMY  1978  . COLONOSCOPY WITH PROPOFOL N/A 02/01/2015   Procedure: COLONOSCOPY WITH PROPOFOL;  Surgeon: Lucilla Lame, MD;  Location: [facility];  Service: Endoscopy;  Laterality: N/A;  Diabetic - oral meds  . ESOPHAGOGASTRODUODENOSCOPY (EGD) WITH PROPOFOL N/A 07/12/2017   Procedure: ESOPHAGOGASTRODUODENOSCOPY (EGD) WITH PROPOFOL;  Surgeon: Lin Landsman, MD;  Location: [place];  Service: Gastroenterology;  Laterality: N/A;  . ESOPHAGOGASTRODUODENOSCOPY (EGD) WITH PROPOFOL N/A 08/16/2017   Procedure: ESOPHAGOGASTRODUODENOSCOPY (EGD) WITH PROPOFOL;  Surgeon: Lin Landsman, MD;  [facility];  Service: Gastroenterology;  Laterality: N/A;  . POLYPECTOMY  02/01/2015   Procedure: POLYPECTOMY;  Surgeon: Lucilla Lame, MD;  Location: [place];  Service: Endoscopy;;  . TONSILLECTOMY AND ADENOIDECTOMY      Her family history includes Alcohol abuse in her father; Cancer in her sister; Dementia in her mother; Lymphoma in her sister and sister.      Current Outpatient Medications:  .  albuterol (PROVENTIL HFA;VENTOLIN HFA) 108 (90 Base) MCG/ACT inhaler, Inhale 2 puffs into the lungs every 6 (six) hours as needed for wheezing or shortness of breath., Disp: 1 Inhaler, Rfl: 0 .  Blood Glucose Monitoring Suppl (ACCU-CHEK AVIVA PLUS) w/Device KIT, TEST FASTING BLOOD SUGAR ONE TIME DAILY, Disp: 1 kit, Rfl: 0 .  Blood Glucose Monitoring Suppl (ACCU-CHEK AVIVA) device, Use as instructed, Disp: 1 each, Rfl: 0 .  furosemide (LASIX) 40 MG tablet, Take 1 tablet (40 mg total) by mouth daily., Disp: 90 tablet, Rfl: 0 .  glucose blood (ACCU-CHEK AVIVA) test strip, Check sugar twice daily DX E 11.9, Disp: 100 each, Rfl: 3 .  IRON PO, Take 45 mg by mouth., Disp: , Rfl:  .  Lancets Misc. (ACCU-CHEK FASTCLIX LANCET) KIT, Test fasting blood sugar daily., Disp: 100 kit, Rfl: 3 .   magnesium oxide (MAG-OX) 400 MG tablet, Take 400 mg by mouth 2 (two) times daily., Disp: , Rfl:  .  nadolol (CORGARD) 20 MG tablet, Take 1 tablet (20 mg total) by mouth daily. Hold if BP is low SBP<90 or feeling dizzy, Disp: 30 tablet, Rfl: 2 .  pantoprazole (PROTONIX) 40 MG tablet, Take 1 tablet (40 mg total) by mouth 2 (two) times daily., Disp: 60 tablet, Rfl: 2 .  spironolactone (ALDACTONE) 100 MG tablet, Take 1 tablet (100 mg total) by mouth daily., Disp: 30 tablet, Rfl: 2  .  Vitamin D, Ergocalciferol, (DRISDOL) 50000 units CAPS capsule, TAKE 1 CAPSULE EVERY WEEK, Disp: 12 capsule, Rfl: 1  Patient Care Team: Chrismon, Vickki Muff, PA as PCP - General (Family Medicine) Bary Castilla, Forest Gleason, MD (General Surgery) Ellin Mayhew,  [place] Lanius, OD as Consulting Physician (Optometry) Cammie Sickle, MD as Consulting Physician (Internal Medicine) Lin Landsman, MD as Consulting Physician (Gastroenterology)     Objective:   Vitals: BP (!) 92/58 (BP Location: Right Arm, Patient Position: Sitting, Cuff Size: Normal)   Pulse (!) 55   Temp 97.6 F (36.4 C) (Oral)   Wt 156 lb 3.2 oz (70.9 kg)   SpO2 99%   BMI 29.51 kg/m   Physical Exam  Constitutional: She is oriented to person, place, and time. She appears well-developed and well-nourished.  HENT:  Head: Normocephalic and atraumatic.  Right Ear: External ear normal.  Left Ear: External ear normal.  Nose: Nose normal.  Mouth/Throat: Oropharynx is clear and moist.  Eyes: Pupils are equal, round, and reactive to light. Conjunctivae and EOM are normal. Right eye exhibits no discharge.  Neck: Normal range of motion. Neck supple. No tracheal deviation present. No thyromegaly present.  Cardiovascular: Normal rate, regular rhythm, normal heart sounds and intact distal pulses.  No murmur heard. Pulmonary/Chest: Effort normal and breath sounds normal. No respiratory distress. She has no wheezes. She has no rales. She exhibits no tenderness.    Abdominal: Soft. She exhibits no distension and no mass. There is no tenderness. There is no rebound and no guarding.  Musculoskeletal: Normal range of motion. She exhibits no edema or tenderness.  Lymphadenopathy:    She has no cervical adenopathy.  Neurological: She is alert and oriented to person, place, and time. She has normal reflexes. She displays normal reflexes. No cranial nerve deficit. She exhibits normal muscle tone. Coordination normal.  Skin: Skin is warm and dry. No rash noted. No erythema. There is pallor.  Psychiatric: She has a normal mood and affect. Her behavior is normal. Judgment and thought content normal.   Diabetic Foot Form - Detailed   Diabetic Foot Exam - detailed Diabetic Foot exam was performed with the following findings:  Yes 08/30/2017 10:11 AM  Visual Foot Exam completed.:  Yes  Can the patient see the bottom of their feet?:  Yes Are the shoes appropriate in style and fit?:  Yes Is there swelling or and abnormal foot shape?:  No Is there a claw toe deformity?:  No Is there elevated skin temparature?:  No Is there foot or ankle muscle weakness?:  No Normal Range of Motion:  Yes Pulse Foot Exam completed.:  Yes  Right posterior Tibialias:  Present Left posterior Tibialias:  Present  Right Dorsalis Pedis:  Present Left Dorsalis Pedis:  Present  Sensory Foot Exam Completed.:  Yes Semmes-Weinstein Monofilament Test R Site 1-Great Toe:  Pos L Site 1-Great Toe:  Pos        Activities of Daily Living In your present state of health, do you have any difficulty performing the following activities: 08/23/2017 07/13/2017  Hearing? N N  Vision? N N  Difficulty concentrating or making decisions? N N  Walking or climbing stairs? N N  Dressing or bathing? N N  Doing errands, shopping? N N  Preparing Food and eating ? N -  Using the Toilet? N -  In the past six months, have you accidently leaked urine? N -  Do you have problems with loss of bowel control? N -   Managing your Medications? N -  Managing your Finances? N -  Housekeeping or managing your Housekeeping? N -  Some recent data might be hidden    Fall Risk Assessment Fall Risk  08/23/2017 07/27/2016 07/11/2016 01/18/2015 10/14/2014  Falls in the past year? _0      Depression Screen PHQ 2/9 Scores 08/23/2017 08/23/2017 07/27/2016 07/11/2016  PHQ - 2 Score 0 0 0 0  PHQ- 9 Score 4 - 0 1     Assessment & Plan:    Annual Physical Reviewed patient's Family Medical History Reviewed and updated list of patient's medical providers Assessment of cognitive impairment was done Assessed patient's functional ability Established a written schedule for health screening [place] Completed and Reviewed  Exercise Activities and Dietary recommendations Goals    . Exercise 150 minutes per week (moderate activity)    . Exercise 3x per week (30 min per time)     Recommend to start walking 3 days a week for 30 minutes.        Immunization History  Administered Date(s) Administered  . Hepatitis A 07/10/2011, 12/25/2011  . Hepatitis B 07/10/2011, 08/11/2011, 12/25/2011  . Influenza Split 02/24/2010, 03/27/2012  . Influenza, High Dose Seasonal PF 01/18/2015, 02/19/2016, 02/05/2017  . Pneumococcal Conjugate-13 07/13/2014  . Pneumococcal Polysaccharide-23 01/27/2009  . Td 04/08/1996  . Tdap 02/24/2010    Health Maintenance  Topic Date Due  . HEMOGLOBIN A1C  07/16/2017  . URINE MICROALBUMIN  07/27/2017  . INFLUENZA VACCINE  12/06/2017  . FOOT EXAM  01/16/2018  . COLONOSCOPY  01/31/2018  . OPHTHALMOLOGY EXAM  03/27/2018  . TETANUS/TDAP  02/25/2020  . DEXA SCAN  Completed  . PNA vac Low Risk Adult  Completed     Discussed health benefits of physical activity, and encouraged her to engage in regular exercise appropriate for her age and condition.    ------------------------------------------------------------------------------------------------------------ 1.  Controlled type 2 diabetes mellitus without complication, without long-term current use of insulin (HCC) No polyuria, polydipsia or vision changes. No longer taking the Metformin. Hgb A1C 6.0% today with normal detailed foot exam. Will get urine microalbumen and encouraged to get annual eye exam. Recheck labs and check FBS daily. Recent FBS in the 120-130 range at home. - POCT UA - Microalbumin - POCT HgB A1C - CBC with Differential/Platelet - Comprehensive metabolic panel - Lipid panel  2. Thrombocytopenia ([place]) Followed by Dr. Rogue Bussing (hematologist). No recent bleeds after treatment of esophageal variceal bleed 07-12-17 with transfusion. - CBC with Differential/Platelet  3. Pure hypercholesterolemia Recheck labs. - Comprehensive metabolic panel - TSH  4. Avitaminosis D Taking 50,000 IU once a week. Recheck CBC and Vitamin-D levels. - CBC with Differential/Platelet - VITAMIN D 25 Hydroxy (Vit-D Deficiency, Fractures)  5. Essential hypertension Low BP today. Hold Corgard as previously advised when systolic BP down to 90. Check labs for electrolyte imbalance with use of two diuretics for cirrhosis with ascites. - CBC with Differential/Platelet - TSH  6. Cirrhosis of liver with ascites, unspecified hepatic cirrhosis type (HCC) Feeling better with ascites better controlled secondary to cirrhosis secondary to NASH with portal hypertension and history of variceal bleeding. Recheck labs and continue follow up with gastroenterologist (Dr Marius Ditch). Using Spironolactone 100 mg qd and Lasix 40 mg qd. BP low today, will hold Corgard 20 mg qd and recheck BP. - CBC with Differential/Platelet - Comprehensive metabolic panel - VITAMIN D 25 Hydroxy (Vit-D Deficiency, Fractures)  7. History of esophageal varices with bleeding Has had banding by Dr. Marius Ditch on 07-12-17 and 08-16-17. No recent hematemesis or melena. Recheck labs. - CBC with Differential/Platelet    Vernie Murders, PA  [facility]

## 2017-08-31 ENCOUNTER — Telehealth: Payer: Self-pay

## 2017-08-31 LAB — CBC WITH DIFFERENTIAL/PLATELET
Basophils Absolute: 0 10*3/uL (ref 0.0–0.2)
Basos: 0 %
EOS (ABSOLUTE): 0.1 10*3/uL (ref 0.0–0.4)
Eos: 2 %
Hematocrit: 37.2 % (ref 34.0–46.6)
Hemoglobin: 12.3 g/dL (ref 11.1–15.9)
Immature Grans (Abs): 0 10*3/uL (ref 0.0–0.1)
Immature Granulocytes: 0 %
LYMPHS ABS: 1 10*3/uL (ref 0.7–3.1)
Lymphs: 30 %
MCH: 30.2 pg (ref 26.6–33.0)
MCHC: 33.1 g/dL (ref 31.5–35.7)
MCV: 91 fL (ref 79–97)
MONOS ABS: 0.3 10*3/uL (ref 0.1–0.9)
Monocytes: 8 %
NEUTROS ABS: 2 10*3/uL (ref 1.4–7.0)
Neutrophils: 60 %
PLATELETS: 90 10*3/uL — AB (ref 150–379)
RBC: 4.07 x10E6/uL (ref 3.77–5.28)
RDW: 15.4 % (ref 12.3–15.4)
WBC: 3.3 10*3/uL — ABNORMAL LOW (ref 3.4–10.8)

## 2017-08-31 LAB — TSH: TSH: 2.03 u[IU]/mL (ref 0.450–4.500)

## 2017-08-31 LAB — COMPREHENSIVE METABOLIC PANEL
A/G RATIO: 1.2 (ref 1.2–2.2)
ALK PHOS: 70 IU/L (ref 39–117)
ALT: 15 IU/L (ref 0–32)
AST: 42 IU/L — ABNORMAL HIGH (ref 0–40)
Albumin: 3.7 g/dL (ref 3.5–4.8)
BILIRUBIN TOTAL: 0.8 mg/dL (ref 0.0–1.2)
BUN/Creatinine Ratio: 20 (ref 12–28)
BUN: 18 mg/dL (ref 8–27)
CHLORIDE: 97 mmol/L (ref 96–106)
CO2: 27 mmol/L (ref 20–29)
Calcium: 9.7 mg/dL (ref 8.7–10.3)
Creatinine, Ser: 0.89 mg/dL (ref 0.57–1.00)
GFR calc Af Amer: 72 mL/min/{1.73_m2} (ref >59)
GFR calc non Af Amer: 63 mL/min/{1.73_m2} (ref >59)
Globulin, Total: 3.1 g/dL (ref 1.5–4.5)
Glucose: 117 mg/dL — ABNORMAL HIGH (ref 65–99)
POTASSIUM: 4.2 mmol/L (ref 3.5–5.2)
Sodium: 138 mmol/L (ref 134–144)
TOTAL PROTEIN: 6.8 g/dL (ref 6.0–8.5)

## 2017-08-31 LAB — LIPID PANEL
Chol/HDL Ratio: 4.2 ratio (ref 0.0–4.4)
Cholesterol, Total: 195 mg/dL (ref 100–199)
HDL: 46 mg/dL (ref >39)
LDL Calculated: 132 mg/dL — ABNORMAL HIGH (ref 0–99)
Triglycerides: 85 mg/dL (ref 0–149)
VLDL Cholesterol Cal: 17 mg/dL (ref 5–40)

## 2017-08-31 LAB — VITAMIN D 25 HYDROXY (VIT D DEFICIENCY, FRACTURES): VIT D 25 HYDROXY: 28.3 ng/mL — AB (ref 30.0–100.0)

## 2017-08-31 NOTE — Telephone Encounter (Signed)
Patient advised. Patient declined starting Simvastatin at this time. Lab results printed at the front desk. She states she will pick up a copy next week to take with her to Dr. Verlin Grills office. Patient states she will call back to schedule a follow up appointment.

## 2017-08-31 NOTE — Telephone Encounter (Signed)
-----   Message from Margo Common, [place] sent at 08/31/2017 10:15 AM EDT ----- No sign of anemia. Platelets a little low but actual count may be a little higher due to some clumping of platelets. No problems with potassium level or glucose. Liver enzymes in good shape. Vitamin-D level only slightly low - go to Vitamin-D 2000 IU BID (OTC). LDL cholesterol is still above the goal of <100. Need Simvastatin 20 mg qd #30 & 3 RF. Be sure Dr. Marius Ditch (gastroenterologist) and Dr. Rogue Bussing (hematologist) sees these results. Recheck levels in 3 months.

## 2017-09-11 ENCOUNTER — Ambulatory Visit: Payer: Medicare HMO | Admitting: Gastroenterology

## 2017-09-11 ENCOUNTER — Other Ambulatory Visit: Payer: Self-pay

## 2017-09-11 VITALS — BP 118/74 | HR 54 | Ht 61.0 in | Wt 152.0 lb

## 2017-09-11 DIAGNOSIS — K7460 Unspecified cirrhosis of liver: Secondary | ICD-10-CM | POA: Diagnosis not present

## 2017-09-11 DIAGNOSIS — I8510 Secondary esophageal varices without bleeding: Secondary | ICD-10-CM

## 2017-09-11 DIAGNOSIS — K7581 Nonalcoholic steatohepatitis (NASH): Secondary | ICD-10-CM | POA: Diagnosis not present

## 2017-09-11 NOTE — Progress Notes (Signed)
Cephas Darby, MD [address]  [place]  [place], [place] [address]  Main: [phone]  Fax: [phone]    Gastroenterology Consultation  Referring Provider:     Margo Common, PA Primary Care Physician:  Margo Common, PA Primary Gastroenterologist:  Dr. Cephas Darby Reason for Consultation:   Decompensated NASH Cirrhosis        HPI:   Jennifer [name] is a 77 y.o. female referred by Dr. Margo Common, PA  for consultation & management of decompensated cirrhosis. Patient has history of metabolic syndrome. Patient was recently admitted to the hospital about 2 weeks ago with variceal bleed,and massive ascites, underwent EGD and ligation of esophageal varices. She had therapeutic paracentesis, fluid analysis consistent with portal hypertension and there as no evidence of SBP. She was temporarily intubated in the setting of massive upper GI bleed. She was discharged home on spironolactone, Lasix, nadolol and pantoprazole. She is known to have cirrhosis since 2013. She had CBC, CMP after discharge which are stable. She is here for hospital follow-up today. She continues to have abdominal distention, reports that swelling of legs has significantly improved, otherwise feeling well. She reports having loose nonbloody stools which is chronic. She denies hematemesis, melena, rectal bleeding, abdominal pain, nausea, vomiting. She recently had an ultrasound which revealed only trace ascites. Hep B surface antigen is negative, HCV antibody negative in 2017, ferritin 22 in 07/2016. She does not have a regular gi f/u for cirrhosis, and not on diuretics. She denies drinking ETOH. She reports that she stopped taking metformin and has been checking her blood glucose levels at home 4 times daily and numbers have been below 120  Follow-up visit 09/11/2017 Patient lost about 25 pounds since last visit after aggressive diuresis. I increased her Lasix to 40 mg and spironolactone 100 mg.  Patient also has pain restricting her sodium intake in her diet. She reports feeling well. Her abdominal distention and swelling of legs have completely resolved. She reports feeling slightly dizzy when she gets up in the morning. She is on nadolol for secondary prophylaxis. She denies any complaints today. She reports that her energy levels are better. She is taking oral iron. She denies black stools or rectal bleeding, hematemesis. Her most recent labs about 2 weeks ago revealed normal hemoglobin, mild thrombocytopenia. Her LFTs are almost normal, hypoalbuminemia resolved. She has preserved kidney function and electrolytes are normal   NSAIDs: none  Antiplts/Anticoagulants/Anti thrombotics: none  GI Procedures:  EGD 07/12/2017 - Normal duodenal bulb and second portion of the duodenum. - Portal hypertensive gastropathy. - Bleeding large (> 5 mm) esophageal varices. Incompletely eradicated. Banded. - No specimens collected.  Colonoscopy 04/03/2005 normal Colonoscopy 02/01/2015 - One 8 mm polyp in the ascending colon. Resected and retrieved. Clips were placed. - One 4 mm polyp in the transverse colon. Resected and retrieved. - One 7 mm polyp in the sigmoid colon. Resected and retrieved. Clip was placed. - Non-bleeding internal hemorrhoids. 1. Colon, polyp(s), ascending colon polyp - SESSILE SERRATED POLYP. - NO DYSPLASIA OR MALIGNANCY. 2. Colon, polyp(s), transverse colon polyp - TUBULAR ADENOMA. - NO HIGH GRADE DYSPLASIA OR MALIGNANCY. 3. Colon, polyp(s), sigmoid colon polyp - TUBULAR ADENOMA. - NO HIGH GRADE DYSPLASIA OR MALIGNANCY.   Past Medical History:  Diagnosis Date  . Allergy   . Arthritis   . Asthma   . Colon polyp   . Fatty liver   . GERD (gastroesophageal reflux disease)   . Hyperlipidemia   .  Hypertension   . Motion sickness    boats    Past Surgical History:  Procedure Laterality Date  . ABDOMINAL HYSTERECTOMY  1978  . COLONOSCOPY WITH PROPOFOL N/A  02/01/2015   Procedure: COLONOSCOPY WITH PROPOFOL;  Surgeon: Lucilla Lame, MD;  Location: [facility];  Service: Endoscopy;  Laterality: N/A;  Diabetic - oral meds  . ESOPHAGOGASTRODUODENOSCOPY (EGD) WITH PROPOFOL N/A 07/12/2017   Procedure: ESOPHAGOGASTRODUODENOSCOPY (EGD) WITH PROPOFOL;  Surgeon: Lin Landsman, MD;  Location: [facility];  Service: Gastroenterology;  Laterality: N/A;  . ESOPHAGOGASTRODUODENOSCOPY (EGD) WITH PROPOFOL N/A 08/16/2017   Procedure: ESOPHAGOGASTRODUODENOSCOPY (EGD) WITH PROPOFOL;  Surgeon: Lin Landsman, MD;  Location[facility] ENDOSCOPY;  Service: Gastroenterology;  Laterality: N/A;  . POLYPECTOMY  02/01/2015   Procedure: POLYPECTOMY;  Surgeon: Lucilla Lame, MD;  Location: [facility];  Service: Endoscopy;;  . TONSILLECTOMY AND ADENOIDECTOMY       Current Outpatient Medications:  .  albuterol (PROVENTIL HFA;VENTOLIN HFA) 108 (90 Base) MCG/ACT inhaler, Inhale 2 puffs into the lungs every 6 (six) hours as needed for wheezing or shortness of breath., Disp: 1 Inhaler, Rfl: 0 .  Blood Glucose Monitoring Suppl (ACCU-CHEK AVIVA PLUS) w/Device KIT, TEST FASTING BLOOD SUGAR ONE TIME DAILY, Disp: 1 kit, Rfl: 0 .  Blood Glucose Monitoring Suppl (ACCU-CHEK AVIVA) device, Use as instructed, Disp: 1 each, Rfl: 0 .  furosemide (LASIX) 20 MG tablet, Take 1 tablet (20 mg total) by mouth daily., Disp: 90 tablet, Rfl: 0 .  glucose blood (ACCU-CHEK AVIVA) test strip, Check sugar twice daily DX E 11.9, Disp: 100 each, Rfl: 3 .  IRON PO, Take 45 mg by mouth., Disp: , Rfl:  .  Lancets Misc. (ACCU-CHEK FASTCLIX LANCET) KIT, Test fasting blood sugar daily., Disp: 100 kit, Rfl: 3 .  magnesium oxide (MAG-OX) 400 MG tablet, Take 400 mg by mouth 2 (two) times daily., Disp: , Rfl:  .  nadolol (CORGARD) 20 MG tablet, Take 1 tablet (20 mg total) by mouth daily. Hold if BP is low SBP<90 or feeling dizzy, Disp: 30 tablet, Rfl: 2 .  pantoprazole (PROTONIX) 40 MG tablet, Take 1  tablet (40 mg total) by mouth 2 (two) times daily., Disp: 60 tablet, Rfl: 2 .  spironolactone (ALDACTONE) 50 MG tablet, Take 1 tablet (50 mg total) by mouth daily., Disp: 30 tablet, Rfl: 2 .  Vitamin D, Ergocalciferol, (DRISDOL) 50000 units CAPS capsule, TAKE 1 CAPSULE EVERY WEEK, Disp: 12 capsule, Rfl: 1   Family History  Problem Relation Age of Onset  . Dementia Mother   . Alcohol abuse Father   . Cancer Sister        lung  . Lymphoma Sister   . Lymphoma Sister      Social History   Tobacco Use  . Smoking status: Former Smoker    Packs/day: 0.50    Years: 6.00    Pack years: 3.00    Types: Cigarettes    Last attempt to quit: 05/08/1987    Years since quitting: 30.3  . Smokeless tobacco: Never Used  Substance Use Topics  . Alcohol use: No  . Drug use: No    Allergies as of 09/11/2017 - Review Complete 09/11/2017  Allergen Reaction Noted  . Codeine Other (See Comments) 10/14/2014  . Hydrocodone-acetaminophen Other (See Comments) 10/14/2014  . Nitrofurantoin Other (See Comments) 11/05/2014  . Nitrofurantoin monohyd macro  10/14/2014  . Penicillins  10/14/2014  . Sulfa antibiotics Other (See Comments) 10/14/2014    Review of Systems:  All systems reviewed and negative except where noted in HPI.   Physical Exam:  BP 118/74   Pulse (!) 54   Ht 5' 1" (1.549 m)   Wt 152 lb (68.9 kg)   BMI 28.72 kg/m  No LMP recorded. Patient has had a hysterectomy.  General:   Alert,  Well-developed, well-nourished, pleasant and cooperative in NAD Head:  Normocephalic and atraumatic. Eyes:  Sclera clear, no icterus.   Conjunctiva pink. Ears:  Normal auditory acuity. Nose:  No deformity, discharge, or lesions. Mouth:  No deformity or lesions,oropharynx pink & moist. Neck:  Supple; no masses or thyromegaly. Lungs:  Respirations even and unlabored.  Clear throughout to auscultation.   No wheezes, crackles, or rhonchi. No acute distress. Heart:  Regular rate and rhythm; no  murmurs, clicks, rubs, or gallops. Abdomen:  Normal bowel sounds. Soft, non-tender and non distended, no hepatosplenomegaly.  No guarding or rebound tenderness.   Rectal: Not performed Msk:  Symmetrical without gross deformities. Good, equal movement & strength bilaterally. Pulses:  Normal pulses noted. Extremities:  No clubbing, no edema.  No cyanosis. Neurologic:  Alert and oriented x3;  grossly normal neurologically. Skin:  Intact without significant lesions or rashes. No jaundice. Psych:  Alert and cooperative. Normal mood and affect.  Imaging Studies: reviewed  Assessment and Plan:   Jennifer [name] is a 77 y.o. Caucasian female with metabolic syndrome, diabetes, hypertension, hyperlipidemia seen in consultation for decompensated cirrhosis with ascites and variceal bleed.  Decompensated cirrhosis: probably secondary to NASH in the setting of metabolic syndrome Hepatitis B and C negative, received hepatitis B vaccine in 2013. Normal ferritin levels Portal hypertension: manifested as ascites, thrombocytopenia, esophageal varices Volume overload: currently euvolemic, status post therapeutic paracentesis on 07/13/2017. She developed large volume ascites and responded very well to diuretics. Decrease spironolactone to 52m, Lasix to 20 mg daily, continue low-sodium diet. She may not need diuretics eventually. Esophageal varices: status post banding on 07/12/2017. Repeat EGD in 08/2017, ligation performed. Schedule EGD for surveillance and banding in 1-2 weeks. Continue nadolol Anemia/thrombocytopenia/coagulopathy: mild and stable PSE: None HCC surveillance: normal AFP, recent ultrasound and CT with no liver lesions HRS: None  History of tubular adenomas: Last colonoscopy in 2016 Recommend repeat colonoscopy when she is more stable, in next 3 months  Follow up in 2 months   RCephas Darby MD

## 2017-09-11 NOTE — Patient Instructions (Addendum)
1. Decrease lasix to 18m and spironolactone to 541mdaily 2. Continue nadolol 3. EGD for variceal surveillance and ligation 4. Follow up in [id number] call our office to speak with my nurse MicDriscilla [phone] business hours from 8am to 4pm if you have any questions/concerns. During after hours, you will be redirected to on call GI physician. For any emergency please call 911 or go the nearest emergency room.    RohCephas DarbyD [id number]ui[address]ovedaleurCocoC [id number]: [phone]: [phone]

## 2017-09-14 ENCOUNTER — Other Ambulatory Visit: Payer: Self-pay | Admitting: Family Medicine

## 2017-09-14 DIAGNOSIS — E559 Vitamin D deficiency, unspecified: Secondary | ICD-10-CM

## 2017-09-17 ENCOUNTER — Other Ambulatory Visit: Payer: Self-pay | Admitting: Family Medicine

## 2017-09-17 DIAGNOSIS — E119 Type 2 diabetes mellitus without complications: Secondary | ICD-10-CM

## 2017-09-19 ENCOUNTER — Other Ambulatory Visit: Payer: Self-pay | Admitting: Family Medicine

## 2017-09-20 ENCOUNTER — Encounter
Admission: RE | Disposition: A | Discharge status: Home / Self Care | Payer: Self-pay | Source: Ambulatory Visit | Attending: Gastroenterology

## 2017-09-20 ENCOUNTER — Ambulatory Visit: Payer: Medicare HMO | Admitting: Anesthesiology

## 2017-09-20 ENCOUNTER — Encounter: Payer: Self-pay | Admitting: *Deleted

## 2017-09-20 ENCOUNTER — Ambulatory Visit
Admission: RE | Admit: 2017-09-20 | Discharge: 2017-09-20 | Disposition: A | Discharge status: Home / Self Care | Payer: Medicare HMO | Source: Ambulatory Visit | Attending: Gastroenterology | Admitting: Gastroenterology

## 2017-09-20 DIAGNOSIS — Z820 Family history of epilepsy and other diseases of the nervous system: Secondary | ICD-10-CM | POA: Insufficient documentation

## 2017-09-20 DIAGNOSIS — Z880 Allergy status to penicillin: Secondary | ICD-10-CM | POA: Diagnosis not present

## 2017-09-20 DIAGNOSIS — I8500 Esophageal varices without bleeding: Secondary | ICD-10-CM

## 2017-09-20 DIAGNOSIS — M1990 Unspecified osteoarthritis, unspecified site: Secondary | ICD-10-CM | POA: Insufficient documentation

## 2017-09-20 DIAGNOSIS — Z79899 Other long term (current) drug therapy: Secondary | ICD-10-CM | POA: Insufficient documentation

## 2017-09-20 DIAGNOSIS — Z807 Family history of other malignant neoplasms of lymphoid, hematopoietic and related tissues: Secondary | ICD-10-CM | POA: Insufficient documentation

## 2017-09-20 DIAGNOSIS — Z7951 Long term (current) use of inhaled steroids: Secondary | ICD-10-CM | POA: Insufficient documentation

## 2017-09-20 DIAGNOSIS — J45909 Unspecified asthma, uncomplicated: Secondary | ICD-10-CM | POA: Insufficient documentation

## 2017-09-20 DIAGNOSIS — Z811 Family history of alcohol abuse and dependence: Secondary | ICD-10-CM | POA: Insufficient documentation

## 2017-09-20 DIAGNOSIS — Z882 Allergy status to sulfonamides status: Secondary | ICD-10-CM | POA: Insufficient documentation

## 2017-09-20 DIAGNOSIS — K766 Portal hypertension: Secondary | ICD-10-CM | POA: Diagnosis not present

## 2017-09-20 DIAGNOSIS — I8501 Esophageal varices with bleeding: Secondary | ICD-10-CM | POA: Diagnosis not present

## 2017-09-20 DIAGNOSIS — K219 Gastro-esophageal reflux disease without esophagitis: Secondary | ICD-10-CM | POA: Insufficient documentation

## 2017-09-20 DIAGNOSIS — K3189 Other diseases of stomach and duodenum: Secondary | ICD-10-CM | POA: Diagnosis not present

## 2017-09-20 DIAGNOSIS — Z90710 Acquired absence of both cervix and uterus: Secondary | ICD-10-CM | POA: Diagnosis not present

## 2017-09-20 DIAGNOSIS — I10 Essential (primary) hypertension: Secondary | ICD-10-CM | POA: Diagnosis not present

## 2017-09-20 DIAGNOSIS — Z885 Allergy status to narcotic agent status: Secondary | ICD-10-CM | POA: Diagnosis not present

## 2017-09-20 DIAGNOSIS — Z86010 Personal history of colonic polyps: Secondary | ICD-10-CM | POA: Insufficient documentation

## 2017-09-20 DIAGNOSIS — E785 Hyperlipidemia, unspecified: Secondary | ICD-10-CM | POA: Diagnosis not present

## 2017-09-20 DIAGNOSIS — I8510 Secondary esophageal varices without bleeding: Secondary | ICD-10-CM | POA: Diagnosis not present

## 2017-09-20 DIAGNOSIS — Z888 Allergy status to other drugs, medicaments and biological substances status: Secondary | ICD-10-CM | POA: Diagnosis not present

## 2017-09-20 DIAGNOSIS — Z87891 Personal history of nicotine dependence: Secondary | ICD-10-CM | POA: Diagnosis not present

## 2017-09-20 DIAGNOSIS — K760 Fatty (change of) liver, not elsewhere classified: Secondary | ICD-10-CM | POA: Diagnosis not present

## 2017-09-20 DIAGNOSIS — E119 Type 2 diabetes mellitus without complications: Secondary | ICD-10-CM | POA: Diagnosis not present

## 2017-09-20 DIAGNOSIS — Z801 Family history of malignant neoplasm of trachea, bronchus and lung: Secondary | ICD-10-CM | POA: Diagnosis not present

## 2017-09-20 HISTORY — PX: ESOPHAGOGASTRODUODENOSCOPY (EGD) WITH PROPOFOL: SHX5813

## 2017-09-20 LAB — GLUCOSE, CAPILLARY: Glucose-Capillary: 128 mg/dL — ABNORMAL HIGH (ref 65–99)

## 2017-09-20 SURGERY — ESOPHAGOGASTRODUODENOSCOPY (EGD) WITH PROPOFOL
Anesthesia: General

## 2017-09-20 MED ORDER — LIDOCAINE HCL (PF) 2 % IJ SOLN
INTRAMUSCULAR | Status: AC
  Filled 2017-09-20: qty 10

## 2017-09-20 MED ORDER — IPRATROPIUM-ALBUTEROL 0.5-2.5 (3) MG/3ML IN SOLN
RESPIRATORY_TRACT | Status: AC
  Administered 2017-09-20: 3 mL via RESPIRATORY_TRACT
  Filled 2017-09-20: qty 3

## 2017-09-20 MED ORDER — PROPOFOL 500 MG/50ML IV EMUL
INTRAVENOUS | Status: DC | PRN
  Administered 2017-09-20: 200 ug/kg/min via INTRAVENOUS

## 2017-09-20 MED ORDER — LIDOCAINE HCL (CARDIAC) PF 100 MG/5ML IV SOSY
PREFILLED_SYRINGE | INTRAVENOUS | Status: DC | PRN
  Administered 2017-09-20: 100 mg via INTRAVENOUS

## 2017-09-20 MED ORDER — PROPOFOL 500 MG/50ML IV EMUL
INTRAVENOUS | Status: AC
  Filled 2017-09-20: qty 50

## 2017-09-20 MED ORDER — SODIUM CHLORIDE 0.9 % IV SOLN
INTRAVENOUS | Status: DC
  Administered 2017-09-20: 09:00:00 via INTRAVENOUS

## 2017-09-20 MED ORDER — PROPOFOL 10 MG/ML IV BOLUS
INTRAVENOUS | Status: DC | PRN
  Administered 2017-09-20: 70 mg via INTRAVENOUS

## 2017-09-20 MED ORDER — IPRATROPIUM-ALBUTEROL 0.5-2.5 (3) MG/3ML IN SOLN
3.0000 mL | Freq: Once | RESPIRATORY_TRACT | Status: AC
  Administered 2017-09-20: 3 mL via RESPIRATORY_TRACT

## 2017-09-20 NOTE — Op Note (Signed)
[facility] Gastroenterology Patient Name: Jennifer [name] Procedure Date: 09/20/2017 10:01 AM MRN: [id number] Account #: [id number] Date of Birth: 1941-02-25 Admit Type: Outpatient Age: 77 Room: [facility] ENDO ROOM 4 Gender: Female Note Status: Finalized Procedure:            Upper GI endoscopy Indications:          For therapy of esophageal varices Providers:            Lin Landsman MD, MD Referring MD:         Vickki Muff. Chrismon, MD (Referring MD) Medicines:            Monitored Anesthesia Care Complications:        No immediate complications. Estimated blood loss: None. Procedure:            Pre-Anesthesia Assessment:                       - Prior to the procedure, a History and Physical was                        performed, and patient medications and allergies were                        reviewed. The patient is competent. The risks and                        benefits of the procedure and the sedation options and                        risks were discussed with the patient. All questions                        were answered and informed consent was obtained.                        Patient identification and proposed procedure were                        verified by the physician, the nurse, the                        anesthesiologist, the anesthetist and the technician in                        the pre-procedure area in the procedure room in the                        endoscopy suite. Mental Status Examination: alert and                        oriented. Airway Examination: normal oropharyngeal                        airway and neck mobility. Respiratory Examination:                        clear to auscultation. CV Examination: normal.                        Prophylactic Antibiotics: The patient does not require  prophylactic antibiotics. Prior Anticoagulants: The                        patient has taken no previous anticoagulant or                   antiplatelet agents. ASA Grade Assessment: III - A                        patient with severe systemic disease. After reviewing                        the risks and benefits, the patient was deemed in                        satisfactory condition to undergo the procedure. The                        anesthesia plan was to use monitored anesthesia care                        (MAC). Immediately prior to administration of                        medications, the patient was re-assessed for adequacy                        to receive sedatives. The heart rate, respiratory rate,                        oxygen saturations, blood pressure, adequacy of                        pulmonary ventilation, and response to care were                        monitored throughout the procedure. The physical status                        of the patient was re-assessed after the procedure.                       After obtaining informed consent, the endoscope was                        passed under direct vision. Throughout the procedure,                        the patient's blood pressure, pulse, and oxygen                        saturations were monitored continuously. The Endoscope                        was introduced through the mouth, and advanced to the                        second part of duodenum. The upper GI endoscopy was                        accomplished  without difficulty. The patient tolerated                        the procedure well. Findings:      The duodenal bulb and second portion of the duodenum were normal.      Severe portal hypertensive gastropathy was found in the entire examined       stomach.      Four columns of non-bleeding large (> 5 mm) varices were found in the       proximal esophagus and in the mid esophagus, 39 cm from the incisors.       Stigmata of recent bleeding were evident and red wale signs were       present. Scarring from prior treatment was visible.  Evidence of partial       eradication was visible. The varices appeared smaller than they were at       prior exam. Three bands were successfully placed with incomplete       eradication of varices. There was no bleeding during, and at the end, of       the procedure. Impression:           - Normal duodenal bulb and second portion of the                        duodenum.                       - Portal hypertensive gastropathy.                       - Recently bleeding large (> 5 mm) esophageal varices.                        Incompletely eradicated. Banded.                       - No specimens collected. Recommendation:       - Discharge patient to home.                       - Low sodium diet.                       - Continue present medications.                       - Continue nadolol                       - Repeat upper endoscopy in 6 weeks for retreatment.                       - Return to my office as previously scheduled. Procedure Code(s):    --- Professional ---                       [phone], Esophagogastroduodenoscopy, flexible, transoral;                        with band ligation of esophageal/gastric varices Diagnosis Code(s):    --- Professional ---                       K76.6, Portal hypertension  K31.89, Other diseases of stomach and duodenum                       I85.01, Esophageal varices with bleeding CPT copyright 2017 American Medical Association. All rights reserved. The codes documented in this report are preliminary and upon coder review may  be revised to meet current compliance requirements. Dr. Ulyess Mort Lin Landsman MD, MD 09/20/2017 10:34:46 AM This report has been signed electronically. Number of Addenda: 0 Note Initiated On: 09/20/2017 10:01 AM      [facility]

## 2017-09-20 NOTE — Transfer of Care (Signed)
Immediate Anesthesia Transfer of Care Note  Patient: Jennifer [name]  Procedure(s) Performed: ESOPHAGOGASTRODUODENOSCOPY (EGD) WITH PROPOFOL (N/A )  Patient Location: PACU and Endoscopy Unit  Anesthesia Type:General  Level of Consciousness: drowsy  Airway & Oxygen Therapy: Patient Spontanous Breathing  Post-op Assessment: Report given to RN  Post vital signs: stable  Last Vitals:  Vitals Value Taken Time  BP 123/63 09/20/2017 10:34 AM  Temp    Pulse 65 09/20/2017 10:34 AM  Resp 13 09/20/2017 10:34 AM  SpO2 95 % 09/20/2017 10:34 AM  Vitals shown include unvalidated device data.  Last Pain:  Vitals:   09/20/17 0902  TempSrc: Tympanic  PainSc: 0-No pain         Complications: No apparent anesthesia complications

## 2017-09-20 NOTE — Anesthesia Postprocedure Evaluation (Signed)
Anesthesia Post Note  Patient: Jennifer [name]  Procedure(s) Performed: ESOPHAGOGASTRODUODENOSCOPY (EGD) WITH PROPOFOL (N/A )  Patient location during evaluation: Endoscopy Anesthesia Type: General Level of consciousness: awake and alert Pain management: pain level controlled Vital Signs Assessment: post-procedure vital signs reviewed and stable Respiratory status: spontaneous breathing, nonlabored ventilation, respiratory function stable and patient connected to nasal cannula oxygen Cardiovascular status: blood pressure returned to baseline and stable Postop Assessment: no apparent nausea or vomiting Anesthetic complications: no     Last Vitals:  Vitals:   09/20/17 0902 09/20/17 1033  BP: (!) 107/52 123/63  Pulse: (!) 54 65  Resp: 16 12  Temp: (!) 36.1 C (!) 36 C  SpO2: 97% 94%    Last Pain:  Vitals:   09/20/17 1033  TempSrc: Tympanic  PainSc:                  Precious Haws Cortlan Dolin

## 2017-09-20 NOTE — H&P (Signed)
Cephas Darby, MD [address]  [place]  [place], [place] [address]  Main: [phone]  Fax: [phone] Pager: [phone]  Primary Care Physician:  Margo Common, PA Primary Gastroenterologist:  Dr. Cephas Darby  Pre-Procedure History & Physical: HPI:  Jennifer [name] is a 77 y.o. female is here for an endoscopy.   Past Medical History:  Diagnosis Date  . Allergy   . Arthritis   . Asthma   . Colon polyp   . Fatty liver   . GERD (gastroesophageal reflux disease)   . Hyperlipidemia   . Hypertension   . Motion sickness    boats    Past Surgical History:  Procedure Laterality Date  . ABDOMINAL HYSTERECTOMY  1978  . COLONOSCOPY WITH PROPOFOL N/A 02/01/2015   Procedure: COLONOSCOPY WITH PROPOFOL;  Surgeon: Lucilla Lame, MD;  Location: [facility];  Service: Endoscopy;  Laterality: N/A;  Diabetic - oral meds  . ESOPHAGOGASTRODUODENOSCOPY (EGD) WITH PROPOFOL N/A 07/12/2017   Procedure: ESOPHAGOGASTRODUODENOSCOPY (EGD) WITH PROPOFOL;  Surgeon: Lin Landsman, MD;  Location: [facility];  Service: Gastroenterology;  Laterality: N/A;  . ESOPHAGOGASTRODUODENOSCOPY (EGD) WITH PROPOFOL N/A 08/16/2017   Procedure: ESOPHAGOGASTRODUODENOSCOPY (EGD) WITH PROPOFOL;  Surgeon: Lin Landsman, MD;  Location: [facility] ENDOSCOPY;  Service: Gastroenterology;  Laterality: N/A;  . POLYPECTOMY  02/01/2015   Procedure: POLYPECTOMY;  Surgeon: Lucilla Lame, MD;  Location: [facility];  Service: Endoscopy;;  . TONSILLECTOMY AND ADENOIDECTOMY      Prior to Admission medications   Medication Sig Start Date End Date Taking? Authorizing Provider  ACCU-CHEK SOFTCLIX LANCETS lancets TEST FASTING BLOOD SUGAR ONE TIME DAILY 09/18/17  Yes Chrismon, Vickki Muff, PA  albuterol (PROVENTIL HFA;VENTOLIN HFA) 108 (90 Base) MCG/ACT inhaler Inhale 2 puffs into the lungs every 6 (six) hours as needed for wheezing or shortness of breath. 03/30/17  Yes Chrismon, Vickki Muff, PA  Blood  Glucose Monitoring Suppl (ACCU-CHEK AVIVA PLUS) w/Device KIT TEST FASTING BLOOD SUGAR ONE TIME DAILY 07/16/17  Yes Chrismon, Vickki Muff, PA  Blood Glucose Monitoring Suppl (ACCU-CHEK AVIVA) device Use as instructed 01/02/17 01/02/18 Yes Chrismon, Vickki Muff, PA  furosemide (LASIX) 20 MG tablet Take 1 tablet (20 mg total) by mouth daily. 08/07/17 11/05/17 Yes Valeta Paz, Tally Due, MD  glucose blood (ACCU-CHEK AVIVA) test strip Check sugar twice daily DX E 11.9 01/02/17  Yes Chrismon, Dennis E, PA  IRON PO Take 45 mg by mouth.   Yes [provider]  Lancets Misc. (ACCU-CHEK FASTCLIX LANCET) KIT Test fasting blood sugar daily. 12/05/15  Yes Chrismon, Vickki Muff, PA  magnesium oxide (MAG-OX) 400 MG tablet Take 400 mg by mouth 2 (two) times daily.   Yes [provider]  nadolol (CORGARD) 20 MG tablet Take 1 tablet (20 mg total) by mouth daily. Hold if BP is low SBP<90 or feeling dizzy 07/15/17  Yes Gladstone Lighter, MD  pantoprazole (PROTONIX) 40 MG tablet Take 1 tablet (40 mg total) by mouth 2 (two) times daily. 07/15/17 10/13/17 Yes Gladstone Lighter, MD  spironolactone (ALDACTONE) 50 MG tablet Take 1 tablet (50 mg total) by mouth daily. 07/16/17  Yes Gladstone Lighter, MD  Vitamin D, Ergocalciferol, (DRISDOL) 50000 units CAPS capsule TAKE 1 CAPSULE EVERY WEEK (NEED TO SCHEDULE FOLLOW UP LABS TO CHECK VITAMIN D LEVEL) 09/14/17  Yes Chrismon, Vickki Muff, PA    Allergies as of 09/11/2017 - Review Complete 09/11/2017  Allergen Reaction Noted  . Codeine Other (See Comments) 10/14/2014  . Hydrocodone-acetaminophen Other (See  Comments) 10/14/2014  . Nitrofurantoin Other (See Comments) 11/05/2014  . Nitrofurantoin monohyd macro  10/14/2014  . Penicillins  10/14/2014  . Sulfa antibiotics Other (See Comments) 10/14/2014    Family History  Problem Relation Age of Onset  . Dementia Mother   . Alcohol abuse Father   . Cancer Sister        lung  . Lymphoma Sister   . Lymphoma Sister     Social History    Socioeconomic History  . Marital status: Widowed    Spouse name: Not on file  . Number of children: 2  . Years of education: Not on file  . Highest education level: Not on file  Occupational History  . Occupation: retired  Scientific laboratory technician  . Financial resource strain: Not hard at all  . Food insecurity:    Worry: Never true    Inability: Never true  . Transportation needs:    Medical: No    Non-medical: No  Tobacco Use  . Smoking status: Former Smoker    Packs/day: 0.50    Years: 6.00    Pack years: 3.00    Types: Cigarettes    Last attempt to quit: 05/08/1987    Years since quitting: 30.3  . Smokeless tobacco: Never Used  Substance and Sexual Activity  . Alcohol use: Never    Frequency: Never  . Drug use: Never  . Sexual activity: Not on file  Lifestyle  . Physical activity:    Days per week: Not on file    Minutes per session: Not on file  . Stress: Not at all  Relationships  . Social connections:    Talks on phone: Not on file    Gets together: Not on file    Attends religious service: Not on file    Active member of club or organization: Not on file    Attends meetings of clubs or organizations: Not on file    Relationship status: Not on file  . Intimate partner violence:    Fear of current or ex partner: Not on file    Emotionally abused: Not on file    Physically abused: Not on file    Forced sexual activity: Not on file  Other Topics Concern  . Not on file  Social History Narrative  . Not on file    Review of Systems: See HPI, otherwise negative ROS  Physical Exam: BP (!) 107/52   Pulse (!) 54   Temp (!) 96.9 F (36.1 C) (Tympanic)   Resp 16   Ht _0  (1.549 m)   Wt 153 lb (69.4 kg)   SpO2 97%   BMI 28.91 kg/m  General:   Alert,  pleasant and cooperative in NAD Head:  Normocephalic and atraumatic. Neck:  Supple; no masses or thyromegaly. Lungs:  Clear throughout to auscultation.    Heart:  Regular rate and rhythm. Abdomen:  Soft,  nontender and nondistended. Normal bowel sounds, without guarding, and without rebound.   Neurologic:  Alert and  oriented x4;  grossly normal neurologically.  Impression/Plan: Jennifer [name] is here for an endoscopy to be performed for variceal surveillance  Risks, benefits, limitations, and alternatives regarding  endoscopy have been reviewed with the patient.  Questions have been answered.  All parties agreeable.   Sherri Sear, MD  09/20/2017, 9:26 AM

## 2017-09-20 NOTE — Anesthesia Preprocedure Evaluation (Signed)
Anesthesia Evaluation  Patient identified by MRN, date of birth, ID band Patient awake    Reviewed: Allergy & Precautions, H&P , NPO status , Patient's Chart, lab work & pertinent test results  History of Anesthesia Complications Negative for: history of anesthetic complications  Airway Mallampati: III  TM Distance: <3 FB Neck ROM: limited    Dental  (+) Chipped, Poor Dentition   Pulmonary neg shortness of breath, asthma , former smoker,           Cardiovascular Exercise Tolerance: Good hypertension, (-) angina(-) Past MI and (-) DOE      Neuro/Psych  Neuromuscular disease negative psych ROS   GI/Hepatic GERD  Controlled and Medicated,(+) Hepatitis -  Endo/Other  diabetes, Type 2  Renal/GU negative Renal ROS  negative genitourinary   Musculoskeletal  (+) Arthritis ,   Abdominal   Peds  Hematology negative hematology ROS (+)   Anesthesia Other Findings Past Medical History: No date: Allergy No date: Arthritis No date: Asthma No date: Colon polyp No date: Fatty liver No date: GERD (gastroesophageal reflux disease) No date: Hyperlipidemia No date: Hypertension No date: Motion sickness     Comment:  boats  Past Surgical History: 1978: ABDOMINAL HYSTERECTOMY 02/01/2015: COLONOSCOPY WITH PROPOFOL; N/A     Comment:  Procedure: COLONOSCOPY WITH PROPOFOL;  Surgeon: Lucilla Lame, MD;  Location: [facility];  Service:               Endoscopy;  Laterality: N/A;  Diabetic - oral meds 07/12/2017: ESOPHAGOGASTRODUODENOSCOPY (EGD) WITH PROPOFOL; N/A     Comment:  Procedure: ESOPHAGOGASTRODUODENOSCOPY (EGD) WITH               PROPOFOL;  Surgeon: Lin Landsman, MD;  Location:               [facility] ENDOSCOPY;  Service: Gastroenterology;  Laterality:               N/A; 08/16/2017: ESOPHAGOGASTRODUODENOSCOPY (EGD) WITH PROPOFOL; N/A     Comment:  Procedure: ESOPHAGOGASTRODUODENOSCOPY (EGD) WITH         PROPOFOL;  Surgeon: Lin Landsman, MD;  Location:               [facility] ENDOSCOPY;  Service: Gastroenterology;  Laterality:               N/A; 02/01/2015: POLYPECTOMY     Comment:  Procedure: POLYPECTOMY;  Surgeon: Lucilla Lame, MD;                Location: [place];  Service: Endoscopy;; No date: TONSILLECTOMY AND ADENOIDECTOMY  BMI    Body Mass Index:  28.91 kg/m      Reproductive/Obstetrics negative OB ROS                             Anesthesia Physical Anesthesia Plan  ASA: III  Anesthesia Plan: General   Post-op Pain Management:    Induction: Intravenous  PONV Risk Score and Plan: Propofol infusion and TIVA  Airway Management Planned: Natural Airway and Nasal Cannula  Additional Equipment:   Intra-op Plan:   Post-operative Plan:   Informed Consent: I have reviewed the patients History and Physical, chart, labs and discussed the procedure including the risks, benefits and alternatives for the proposed anesthesia with the patient or authorized representative who has indicated his/her understanding and acceptance.  Dental Advisory Given  Plan Discussed with: Anesthesiologist, CRNA and Surgeon  Anesthesia Plan Comments: (Patient consented for risks of anesthesia including but not limited to:  - adverse reactions to medications - risk of intubation if required - damage to teeth, lips or other oral mucosa - sore throat or hoarseness - Damage to heart, brain, lungs or loss of life  Patient voiced understanding.)        Anesthesia Quick Evaluation

## 2017-09-20 NOTE — Anesthesia Post-op Follow-up Note (Signed)
Anesthesia QCDR form completed.        

## 2017-09-24 ENCOUNTER — Encounter: Payer: Self-pay | Admitting: Gastroenterology

## 2017-09-27 ENCOUNTER — Other Ambulatory Visit: Payer: Self-pay

## 2017-09-27 DIAGNOSIS — I8501 Esophageal varices with bleeding: Secondary | ICD-10-CM

## 2017-10-08 ENCOUNTER — Other Ambulatory Visit: Payer: Self-pay | Admitting: Gastroenterology

## 2017-10-08 ENCOUNTER — Other Ambulatory Visit: Payer: Self-pay

## 2017-10-08 MED ORDER — PANTOPRAZOLE SODIUM 40 MG PO TBEC: 40.0000 mg | DELAYED_RELEASE_TABLET | Freq: Two times a day (BID) | ORAL | 2 refills | Status: DC

## 2017-10-08 MED ORDER — NADOLOL 20 MG PO TABS: 20.0000 mg | ORAL_TABLET | Freq: Every day | ORAL | 2 refills | Status: DC

## 2017-10-08 NOTE — Telephone Encounter (Signed)
Patient needs refill from [facility]. Please call patient

## 2017-11-01 ENCOUNTER — Ambulatory Visit: Payer: Medicare HMO | Admitting: Anesthesiology

## 2017-11-01 ENCOUNTER — Encounter
Admission: RE | Disposition: A | Discharge status: Home / Self Care | Payer: Self-pay | Source: Ambulatory Visit | Attending: Gastroenterology

## 2017-11-01 ENCOUNTER — Ambulatory Visit
Admission: RE | Admit: 2017-11-01 | Discharge: 2017-11-01 | Disposition: A | Discharge status: Home / Self Care | Payer: Medicare HMO | Source: Ambulatory Visit | Attending: Gastroenterology | Admitting: Gastroenterology

## 2017-11-01 ENCOUNTER — Encounter: Payer: Self-pay | Admitting: Anesthesiology

## 2017-11-01 DIAGNOSIS — I8500 Esophageal varices without bleeding: Secondary | ICD-10-CM | POA: Diagnosis not present

## 2017-11-01 DIAGNOSIS — Z87891 Personal history of nicotine dependence: Secondary | ICD-10-CM | POA: Diagnosis not present

## 2017-11-01 DIAGNOSIS — K317 Polyp of stomach and duodenum: Secondary | ICD-10-CM

## 2017-11-01 DIAGNOSIS — K219 Gastro-esophageal reflux disease without esophagitis: Secondary | ICD-10-CM | POA: Insufficient documentation

## 2017-11-01 DIAGNOSIS — K760 Fatty (change of) liver, not elsewhere classified: Secondary | ICD-10-CM | POA: Diagnosis not present

## 2017-11-01 DIAGNOSIS — Z79899 Other long term (current) drug therapy: Secondary | ICD-10-CM | POA: Insufficient documentation

## 2017-11-01 DIAGNOSIS — J45909 Unspecified asthma, uncomplicated: Secondary | ICD-10-CM | POA: Diagnosis not present

## 2017-11-01 DIAGNOSIS — E119 Type 2 diabetes mellitus without complications: Secondary | ICD-10-CM | POA: Diagnosis not present

## 2017-11-01 DIAGNOSIS — I8510 Secondary esophageal varices without bleeding: Secondary | ICD-10-CM

## 2017-11-01 DIAGNOSIS — E785 Hyperlipidemia, unspecified: Secondary | ICD-10-CM | POA: Diagnosis not present

## 2017-11-01 DIAGNOSIS — I10 Essential (primary) hypertension: Secondary | ICD-10-CM | POA: Diagnosis not present

## 2017-11-01 DIAGNOSIS — K766 Portal hypertension: Secondary | ICD-10-CM | POA: Diagnosis not present

## 2017-11-01 DIAGNOSIS — K3189 Other diseases of stomach and duodenum: Secondary | ICD-10-CM

## 2017-11-01 DIAGNOSIS — I8511 Secondary esophageal varices with bleeding: Secondary | ICD-10-CM | POA: Diagnosis not present

## 2017-11-01 DIAGNOSIS — I8501 Esophageal varices with bleeding: Secondary | ICD-10-CM

## 2017-11-01 HISTORY — PX: ESOPHAGOGASTRODUODENOSCOPY (EGD) WITH PROPOFOL: SHX5813

## 2017-11-01 SURGERY — ESOPHAGOGASTRODUODENOSCOPY (EGD) WITH PROPOFOL
Anesthesia: General

## 2017-11-01 MED ORDER — PANTOPRAZOLE SODIUM 40 MG PO TBEC: 40.0000 mg | DELAYED_RELEASE_TABLET | Freq: Every day | ORAL | 2 refills | Status: DC

## 2017-11-01 MED ORDER — PROPOFOL 10 MG/ML IV BOLUS
INTRAVENOUS | Status: DC | PRN
  Administered 2017-11-01: 30 mg via INTRAVENOUS

## 2017-11-01 MED ORDER — PROPOFOL 500 MG/50ML IV EMUL
INTRAVENOUS | Status: AC
  Filled 2017-11-01: qty 50

## 2017-11-01 MED ORDER — FUROSEMIDE 40 MG PO TABS: 40.0000 mg | ORAL_TABLET | Freq: Every day | ORAL | 0 refills | Status: DC

## 2017-11-01 MED ORDER — PROPOFOL 500 MG/50ML IV EMUL
INTRAVENOUS | Status: DC | PRN
  Administered 2017-11-01: 125 ug/kg/min via INTRAVENOUS

## 2017-11-01 MED ORDER — SODIUM CHLORIDE 0.9 % IV SOLN
INTRAVENOUS | Status: DC
  Administered 2017-11-01: 1000 mL via INTRAVENOUS

## 2017-11-01 NOTE — Anesthesia Post-op Follow-up Note (Signed)
Anesthesia QCDR form completed.        

## 2017-11-01 NOTE — Anesthesia Preprocedure Evaluation (Signed)
Anesthesia Evaluation  Patient identified by MRN, date of birth, ID band Patient awake    Reviewed: Allergy & Precautions, H&P , NPO status , Patient's Chart, lab work & pertinent test results  History of Anesthesia Complications Negative for: history of anesthetic complications  Airway Mallampati: III  TM Distance: <3 FB Neck ROM: limited    Dental  (+) Chipped, Poor Dentition   Pulmonary neg shortness of breath, asthma , former smoker,           Cardiovascular Exercise Tolerance: Good hypertension, (-) angina(-) Past MI and (-) DOE      Neuro/Psych  Neuromuscular disease negative psych ROS   GI/Hepatic GERD  Controlled and Medicated,(+) Hepatitis -  Endo/Other  diabetes, Type 2  Renal/GU negative Renal ROS  negative genitourinary   Musculoskeletal  (+) Arthritis ,   Abdominal   Peds  Hematology negative hematology ROS (+)   Anesthesia Other Findings Past Medical History: No date: Allergy No date: Arthritis No date: Asthma No date: Colon polyp No date: Fatty liver No date: GERD (gastroesophageal reflux disease) No date: Hyperlipidemia No date: Hypertension No date: Motion sickness     Comment:  boats  Past Surgical History: 1978: ABDOMINAL HYSTERECTOMY 02/01/2015: COLONOSCOPY WITH PROPOFOL; N/A     Comment:  Procedure: COLONOSCOPY WITH PROPOFOL;  Surgeon: Lucilla Lame, MD;  Location: [facility];  Service:               Endoscopy;  Laterality: N/A;  Diabetic - oral meds 07/12/2017: ESOPHAGOGASTRODUODENOSCOPY (EGD) WITH PROPOFOL; N/A     Comment:  Procedure: ESOPHAGOGASTRODUODENOSCOPY (EGD) WITH               PROPOFOL;  Surgeon: Lin Landsman, MD;  Location:               [facility] ENDOSCOPY;  Service: Gastroenterology;  Laterality:               N/A; 08/16/2017: ESOPHAGOGASTRODUODENOSCOPY (EGD) WITH PROPOFOL; N/A     Comment:  Procedure: ESOPHAGOGASTRODUODENOSCOPY (EGD) WITH         PROPOFOL;  Surgeon: Lin Landsman, MD;  Location:               [facility] ENDOSCOPY;  Service: Gastroenterology;  Laterality:               N/A; 02/01/2015: POLYPECTOMY     Comment:  Procedure: POLYPECTOMY;  Surgeon: Lucilla Lame, MD;                Location: [facility];  Service: Endoscopy;; No date: TONSILLECTOMY AND ADENOIDECTOMY  BMI    Body Mass Index:  28.91 kg/m      Reproductive/Obstetrics negative OB ROS                             Anesthesia Physical  Anesthesia Plan  ASA: III  Anesthesia Plan: General   Post-op Pain Management:    Induction: Intravenous  PONV Risk Score and Plan: Propofol infusion and TIVA  Airway Management Planned: Natural Airway and Nasal Cannula  Additional Equipment:   Intra-op Plan:   Post-operative Plan:   Informed Consent: I have reviewed the patients History and Physical, chart, labs and discussed the procedure including the risks, benefits and alternatives for the proposed anesthesia with the patient or authorized representative who has indicated his/her understanding and acceptance.  Dental Advisory Given  Plan Discussed with: Anesthesiologist, CRNA and Surgeon  Anesthesia Plan Comments: (Patient consented for risks of anesthesia including but not limited to:  - adverse reactions to medications - risk of intubation if required - damage to teeth, lips or other oral mucosa - sore throat or hoarseness - Damage to heart, brain, lungs or loss of life  Patient voiced understanding.)        Anesthesia Quick Evaluation

## 2017-11-01 NOTE — Anesthesia Postprocedure Evaluation (Signed)
Anesthesia Post Note  Patient: ABEER DESKINS  Procedure(s) Performed: ESOPHAGOGASTRODUODENOSCOPY (EGD) WITH PROPOFOL (N/A )  Patient location during evaluation: Endoscopy Anesthesia Type: General Level of consciousness: awake and alert Pain management: pain level controlled Vital Signs Assessment: post-procedure vital signs reviewed and stable Respiratory status: spontaneous breathing, nonlabored ventilation, respiratory function stable and patient connected to nasal cannula oxygen Cardiovascular status: blood pressure returned to baseline and stable Postop Assessment: no apparent nausea or vomiting Anesthetic complications: no     Last Vitals:  Vitals:   11/01/17 0930 11/01/17 0940  BP: 115/63 122/66  Pulse: (!) 52 (!) 51  Resp: 20 14  Temp:    SpO2: 98% 97%    Last Pain:  Vitals:   11/01/17 0910  TempSrc: Tympanic  PainSc:                  Martha Clan

## 2017-11-01 NOTE — Transfer of Care (Signed)
Immediate Anesthesia Transfer of Care Note  Patient: Jennifer [name]  Procedure(s) Performed: ESOPHAGOGASTRODUODENOSCOPY (EGD) WITH PROPOFOL (N/A )  Patient Location: PACU and Endoscopy Unit  Anesthesia Type:General  Level of Consciousness: awake, alert  and oriented  Airway & Oxygen Therapy: Patient Spontanous Breathing and Patient connected to nasal cannula oxygen  Post-op Assessment: Report given to RN and Post -op Vital signs reviewed and stable  Post vital signs: Reviewed and stable  Last Vitals:  Vitals Value Taken Time  BP 108/54 11/01/2017  9:17 AM  Temp 35.8 C 11/01/2017  9:10 AM  Pulse 53 11/01/2017  9:19 AM  Resp 18 11/01/2017  9:19 AM  SpO2 96 % 11/01/2017  9:19 AM  Vitals shown include unvalidated device data.  Last Pain:  Vitals:   11/01/17 0910  TempSrc: Tympanic  PainSc:          Complications: No apparent anesthesia complications

## 2017-11-01 NOTE — Op Note (Addendum)
[facility] Gastroenterology Patient Name: Jennifer [name] Procedure Date: 11/01/2017 8:46 AM MRN: [id number] Account #: [id number] Date of Birth: 07-18-1940 Admit Type: Outpatient Age: 77 Room: [facility] ENDO ROOM 4 Gender: Female Note Status: Finalized Procedure:            Upper GI endoscopy Indications:          Follow-up of esophageal varices Providers:            Lin Landsman MD, MD Referring MD:         Vickki Muff. Chrismon, MD (Referring MD) Medicines:            Monitored Anesthesia Care Complications:        No immediate complications. Estimated blood loss: None. Procedure:            Pre-Anesthesia Assessment:                       - Prior to the procedure, a History and Physical was                        performed, and patient medications and allergies were                        reviewed. The patient is competent. The risks and                        benefits of the procedure and the sedation options and                        risks were discussed with the patient. All questions                        were answered and informed consent was obtained.                        Patient identification and proposed procedure were                        verified by the physician, the nurse, the                        anesthesiologist, the anesthetist and the technician in                        the pre-procedure area in the procedure room in the                        endoscopy suite. Mental Status Examination: alert and                        oriented. Airway Examination: normal oropharyngeal                        airway and neck mobility. Respiratory Examination:                        clear to auscultation. CV Examination: normal.                        Prophylactic Antibiotics: The patient does not require  prophylactic antibiotics. Prior Anticoagulants: The                        patient has taken no previous anticoagulant or                antiplatelet agents. ASA Grade Assessment: III - A                        patient with severe systemic disease. After reviewing                        the risks and benefits, the patient was deemed in                        satisfactory condition to undergo the procedure. The                        anesthesia plan was to use monitored anesthesia care                        (MAC). Immediately prior to administration of                        medications, the patient was re-assessed for adequacy                        to receive sedatives. The heart rate, respiratory rate,                        oxygen saturations, blood pressure, adequacy of                        pulmonary ventilation, and response to care were                        monitored throughout the procedure. The physical status                        of the patient was re-assessed after the procedure.                       After obtaining informed consent, the endoscope was                        passed under direct vision. Throughout the procedure,                        the patient's blood pressure, pulse, and oxygen                        saturations were monitored continuously. The Endoscope                        was introduced through the mouth, and advanced to the                        second part of duodenum. The upper GI endoscopy was                        accomplished without difficulty. The  patient tolerated                        the procedure well. Findings:      The duodenal bulb and second portion of the duodenum were normal.      Severe portal hypertensive gastropathy was found in the entire examined       stomach.      A few 5 mm sessile polyps with no bleeding and no stigmata of recent       bleeding were found in the gastric fundus and in the gastric body.      Large (> 5 mm) varices were found in the middle third of the esophagus       and in the lower third of the esophagus. Extensive scarring  from prior       banding, interspersed between varices in lower esophagus. Therefore,       banding is not performed today Impression:           - Normal duodenal bulb and second portion of the                        duodenum.                       - Portal hypertensive gastropathy.                       - A few gastric polyps.                       - Large (> 5 mm) esophageal varices.                       - No specimens collected. Recommendation:       - Discharge patient to home.                       - Resume previous diet today.                       - Continue present medications.                       - Continue beta blocker for secondary variceal                        prophylaxis                       - Repeat upper endoscopy in 3 months for surveillance.                       - Return to GI clinic as previously scheduled. Procedure Code(s):    --- Professional ---                       [phone], Esophagogastroduodenoscopy, flexible, transoral;                        diagnostic, including collection of specimen(s) by                        brushing or washing, when performed (separate procedure) Diagnosis Code(s):    --- Professional ---  K76.6, Portal hypertension                       K31.89, Other diseases of stomach and duodenum                       K31.7, Polyp of stomach and duodenum                       I85.00, Esophageal varices without bleeding CPT copyright 2017 [employer] Medical [employer]. All rights reserved. The codes documented in this report are preliminary and upon coder review may  be revised to meet current compliance requirements. Dr. Ulyess Mort Lin Landsman MD, MD 11/01/2017 9:19:03 AM This report has been signed electronically. Number of Addenda: 0 Note Initiated On: 11/01/2017 8:46 AM      [facility]

## 2017-11-01 NOTE — H&P (Signed)
Cephas Darby, MD [address]  [place]  [place], [place] [address]  Main: [phone]  Fax: [phone] Pager: [phone]  Primary Care Physician:  Margo Common, PA Primary Gastroenterologist:  Dr. Cephas Darby  Pre-Procedure History & Physical: HPI:  Jennifer [name] is a 77 y.o. female is here for an endoscopy.   Past Medical History:  Diagnosis Date  . Allergy   . Arthritis   . Asthma   . Colon polyp   . Fatty liver   . GERD (gastroesophageal reflux disease)   . Hyperlipidemia   . Hypertension   . Motion sickness    boats    Past Surgical History:  Procedure Laterality Date  . ABDOMINAL HYSTERECTOMY  1978  . COLONOSCOPY WITH PROPOFOL N/A 02/01/2015   Procedure: COLONOSCOPY WITH PROPOFOL;  Surgeon: Lucilla Lame, MD;  Location: [facility];  Service: Endoscopy;  Laterality: N/A;  Diabetic - oral meds  . ESOPHAGOGASTRODUODENOSCOPY (EGD) WITH PROPOFOL N/A 07/12/2017   Procedure: ESOPHAGOGASTRODUODENOSCOPY (EGD) WITH PROPOFOL;  Surgeon: Lin Landsman, MD;  Location: [facility];  Service: Gastroenterology;  Laterality: N/A;  . ESOPHAGOGASTRODUODENOSCOPY (EGD) WITH PROPOFOL N/A 08/16/2017   Procedure: ESOPHAGOGASTRODUODENOSCOPY (EGD) WITH PROPOFOL;  Surgeon: Lin Landsman, MD;  Location: [facility] ENDOSCOPY;  Service: Gastroenterology;  Laterality: N/A;  . ESOPHAGOGASTRODUODENOSCOPY (EGD) WITH PROPOFOL N/A 09/20/2017   Procedure: ESOPHAGOGASTRODUODENOSCOPY (EGD) WITH PROPOFOL;  Surgeon: Lin Landsman, MD;  Location: [facility] ENDOSCOPY;  Service: Gastroenterology;  Laterality: N/A;  . POLYPECTOMY  02/01/2015   Procedure: POLYPECTOMY;  Surgeon: Lucilla Lame, MD;  Location: [facility];  Service: Endoscopy;;  . TONSILLECTOMY AND ADENOIDECTOMY      Prior to Admission medications   Medication Sig Start Date End Date Taking? Authorizing Provider  ACCU-CHEK AVIVA PLUS test strip TEST FASTING BLOOD SUGAR ONE TIME DAILY 09/20/17   Chrismon,  Vickki Muff, PA  ACCU-CHEK SOFTCLIX LANCETS lancets TEST FASTING BLOOD SUGAR ONE TIME DAILY 09/18/17   Chrismon, Vickki Muff, PA  albuterol (PROVENTIL HFA;VENTOLIN HFA) 108 (90 Base) MCG/ACT inhaler Inhale 2 puffs into the lungs every 6 (six) hours as needed for wheezing or shortness of breath. 03/30/17   Chrismon, Vickki Muff, PA  Blood Glucose Monitoring Suppl (ACCU-CHEK AVIVA PLUS) w/Device KIT TEST FASTING BLOOD SUGAR ONE TIME DAILY 07/16/17   Chrismon, Vickki Muff, PA  Blood Glucose Monitoring Suppl (ACCU-CHEK AVIVA) device Use as instructed 01/02/17 01/02/18  Chrismon, Vickki Muff, PA  furosemide (LASIX) 20 MG tablet Take 1 tablet (20 mg total) by mouth daily. 08/07/17 11/05/17  Lin Landsman, MD  furosemide (LASIX) 40 MG tablet TAKE 1 TABLET DAILY 10/09/17   Lin Landsman, MD  IRON PO Take 45 mg by mouth.    [provider]  Lancets Misc. (ACCU-CHEK FASTCLIX LANCET) KIT Test fasting blood sugar daily. 12/05/15   Chrismon, Vickki Muff, PA  magnesium oxide (MAG-OX) 400 MG tablet Take 400 mg by mouth 2 (two) times daily.    [provider]  metFORMIN (GLUCOPHAGE) 500 MG tablet TAKE 1 TABLET TWICE DAILY WITH MEALS (NEED MD APPOINTMENT) 09/20/17   Chrismon, Vickki Muff, PA  nadolol (CORGARD) 20 MG tablet Take 1 tablet (20 mg total) by mouth daily. Hold if BP is low SBP<90 or feeling dizzy 10/08/17   Lin Landsman, MD  pantoprazole (PROTONIX) 40 MG tablet Take 1 tablet (40 mg total) by mouth 2 (two) times daily. 10/08/17 01/06/18  Lin Landsman, MD  spironolactone (ALDACTONE) 100 MG tablet TAKE 1  TABLET DAILY 10/09/17   Lin Landsman, MD  spironolactone (ALDACTONE) 50 MG tablet Take 1 tablet (50 mg total) by mouth daily. 07/16/17   Gladstone Lighter, MD  Vitamin D, Ergocalciferol, (DRISDOL) 50000 units CAPS capsule TAKE 1 CAPSULE EVERY WEEK (NEED TO SCHEDULE FOLLOW UP LABS TO CHECK VITAMIN D LEVEL) 09/14/17   Chrismon, Vickki Muff, PA    Allergies as of 09/28/2017 - Review Complete 09/20/2017    Allergen Reaction Noted  . Codeine Other (See Comments) 10/14/2014  . Hydrocodone-acetaminophen Other (See Comments) 10/14/2014  . Nitrofurantoin Other (See Comments) 11/05/2014  . Nitrofurantoin monohyd macro  10/14/2014  . Penicillins  10/14/2014  . Sulfa antibiotics Other (See Comments) 10/14/2014    Family History  Problem Relation Age of Onset  . Dementia Mother   . Alcohol abuse Father   . Cancer Sister        lung  . Lymphoma Sister   . Lymphoma Sister     Social History   Socioeconomic History  . Marital status: Widowed    Spouse name: Not on file  . Number of children: 2  . Years of education: Not on file  . Highest education level: Not on file  Occupational History  . Occupation: retired  Scientific laboratory technician  . Financial resource strain: Not hard at all  . Food insecurity:    Worry: Never true    Inability: Never true  . Transportation needs:    Medical: No    Non-medical: No  Tobacco Use  . Smoking status: Former Smoker    Packs/day: 0.50    Years: 6.00    Pack years: 3.00    Types: Cigarettes    Last attempt to quit: 05/08/1987    Years since quitting: 30.5  . Smokeless tobacco: Never Used  Substance and Sexual Activity  . Alcohol use: Never    Frequency: Never  . Drug use: Never  . Sexual activity: Not on file  Lifestyle  . Physical activity:    Days per week: Not on file    Minutes per session: Not on file  . Stress: Not at all  Relationships  . Social connections:    Talks on phone: Not on file    Gets together: Not on file    Attends religious service: Not on file    Active member of club or organization: Not on file    Attends meetings of clubs or organizations: Not on file    Relationship status: Not on file  . Intimate partner violence:    Fear of current or ex partner: Not on file    Emotionally abused: Not on file    Physically abused: Not on file    Forced sexual activity: Not on file  Other Topics Concern  . Not on file  Social  History Narrative  . Not on file    Review of Systems: See HPI, otherwise negative ROS  Physical Exam: BP (!) 97/55   Pulse 93   Temp (!) 96.6 F (35.9 C) (Tympanic)   Resp 20   Ht 5' 1" (1.549 m)   Wt 162 lb (73.5 kg)   SpO2 100%   BMI 30.61 kg/m  General:   Alert,  pleasant and cooperative in NAD Head:  Normocephalic and atraumatic. Neck:  Supple; no masses or thyromegaly. Lungs:  Clear throughout to auscultation.    Heart:  Regular rate and rhythm. Abdomen:  Soft, nontender and nondistended. Normal bowel sounds, without guarding, and without rebound.  Neurologic:  Alert and  oriented x4;  grossly normal neurologically.  Impression/Plan: TIFFINEY SPARROW is here for an endoscopy to be performed for variceal surveillance  Risks, benefits, limitations, and alternatives regarding  endoscopy have been reviewed with the patient.  Questions have been answered.  All parties agreeable.   Sherri Sear, MD  11/01/2017, 9:01 AM

## 2017-11-02 ENCOUNTER — Encounter: Payer: Self-pay | Admitting: Gastroenterology

## 2017-11-02 ENCOUNTER — Telehealth: Payer: Self-pay

## 2017-11-02 NOTE — Telephone Encounter (Signed)
Patient contacted office states Dr. Marius Ditch wanted her to increase Furosemide to 60 mg and she did not have enough.  Confirmed this with Dr. Marius Ditch said she sent prescription to pharmacy yesterday. Dr. Marius Ditch advised patient to increase furosemide to 60 mg until she is seen in office for her follow up on the 12 th.  I contacted pharmacy and they did received new rx for 40 mg.  I gave pharmacist the instructions that patient needs to increase to 60 mg until her office visit with [place] on 07/12.  They will call pt when rx is ready.  Thanks [employer]

## 2017-11-16 ENCOUNTER — Ambulatory Visit: Payer: Medicare HMO | Admitting: Gastroenterology

## 2017-11-16 ENCOUNTER — Other Ambulatory Visit
Admission: RE | Admit: 2017-11-16 | Discharge: 2017-11-16 | Disposition: A | Discharge status: Home / Self Care | Payer: Medicare HMO | Source: Ambulatory Visit | Attending: Gastroenterology | Admitting: Gastroenterology

## 2017-11-16 ENCOUNTER — Encounter: Payer: Self-pay | Admitting: Gastroenterology

## 2017-11-16 ENCOUNTER — Encounter (INDEPENDENT_AMBULATORY_CARE_PROVIDER_SITE_OTHER): Payer: Self-pay

## 2017-11-16 ENCOUNTER — Other Ambulatory Visit: Payer: Self-pay

## 2017-11-16 VITALS — BP 113/67 | HR 60 | Resp 16 | Ht 61.0 in | Wt 157.2 lb

## 2017-11-16 DIAGNOSIS — K7460 Unspecified cirrhosis of liver: Secondary | ICD-10-CM | POA: Diagnosis not present

## 2017-11-16 DIAGNOSIS — K7581 Nonalcoholic steatohepatitis (NASH): Secondary | ICD-10-CM

## 2017-11-16 DIAGNOSIS — Z86010 Personal history of colonic polyps: Secondary | ICD-10-CM

## 2017-11-16 DIAGNOSIS — R188 Other ascites: Secondary | ICD-10-CM | POA: Diagnosis not present

## 2017-11-16 DIAGNOSIS — D696 Thrombocytopenia, unspecified: Secondary | ICD-10-CM | POA: Diagnosis not present

## 2017-11-16 DIAGNOSIS — D126 Benign neoplasm of colon, unspecified: Secondary | ICD-10-CM | POA: Diagnosis not present

## 2017-11-16 LAB — COMPREHENSIVE METABOLIC PANEL
ALT: 21 U/L (ref 0–44)
AST: 40 U/L (ref 15–41)
Albumin: 4 g/dL (ref 3.5–5.0)
Alkaline Phosphatase: 57 U/L (ref 38–126)
Anion gap: 8 (ref 5–15)
BUN: 27 mg/dL — ABNORMAL HIGH (ref 8–23)
CALCIUM: 10 mg/dL (ref 8.9–10.3)
CO2: 31 mmol/L (ref 22–32)
CREATININE: 1.16 mg/dL — AB (ref 0.44–1.00)
Chloride: 96 mmol/L — ABNORMAL LOW (ref 98–111)
GFR, EST AFRICAN AMERICAN: 51 mL/min — AB (ref >60)
GFR, EST NON AFRICAN AMERICAN: 44 mL/min — AB (ref >60)
Glucose, Bld: 143 mg/dL — ABNORMAL HIGH (ref 70–99)
Potassium: 4.3 mmol/L (ref 3.5–5.1)
SODIUM: 135 mmol/L (ref 135–145)
Total Bilirubin: 1.3 mg/dL — ABNORMAL HIGH (ref 0.3–1.2)
Total Protein: 7.3 g/dL (ref 6.5–8.1)

## 2017-11-16 LAB — CBC WITH DIFFERENTIAL/PLATELET
BASOS PCT: 1 %
Basophils Absolute: 0 10*3/uL (ref 0–0.1)
Eosinophils Absolute: 0 10*3/uL (ref 0–0.7)
Eosinophils Relative: 1 %
HCT: 36.4 % (ref 35.0–47.0)
HEMOGLOBIN: 12.8 g/dL (ref 12.0–16.0)
Lymphocytes Relative: 24 %
Lymphs Abs: 0.8 10*3/uL — ABNORMAL LOW (ref 1.0–3.6)
MCH: 31.6 pg (ref 26.0–34.0)
MCHC: 35.2 g/dL (ref 32.0–36.0)
MCV: 89.6 fL (ref 80.0–100.0)
MONOS PCT: 9 %
Monocytes Absolute: 0.3 10*3/uL (ref 0.2–0.9)
NEUTROS PCT: 65 %
Neutro Abs: 2.2 10*3/uL (ref 1.4–6.5)
Platelets: 78 10*3/uL — ABNORMAL LOW (ref 150–440)
RBC: 4.06 MIL/uL (ref 3.80–5.20)
RDW: 15 % — AB (ref 11.5–14.5)
WBC: 3.4 10*3/uL — ABNORMAL LOW (ref 3.6–11.0)

## 2017-11-16 LAB — APTT: aPTT: 31 seconds (ref 24–36)

## 2017-11-16 LAB — PROTIME-INR
INR: 1.16
Prothrombin Time: 14.7 seconds (ref 11.4–15.2)

## 2017-11-16 NOTE — Progress Notes (Addendum)
Cephas Darby, MD [address]  [place]  [place], [place] [address]  Main: [phone]  Fax: [phone]    Gastroenterology Consultation  Referring Provider:     Margo Common, PA Primary Care Physician:  Margo Common, PA Primary Gastroenterologist:  Dr. Cephas Darby Reason for Consultation:   Decompensated NASH Cirrhosis        HPI:   Jennifer [name] is a 77 y.o. female referred by Dr. Margo Common, PA  for consultation & management of decompensated cirrhosis. Patient has history of metabolic syndrome. Patient was recently admitted to the hospital about 2 weeks ago with variceal bleed,and massive ascites, underwent EGD and ligation of esophageal varices. She had therapeutic paracentesis, fluid analysis consistent with portal hypertension and there as no evidence of SBP. She was temporarily intubated in the setting of massive upper GI bleed. She was discharged home on spironolactone, Lasix, nadolol and pantoprazole. She is known to have cirrhosis since 2013. She had CBC, CMP after discharge which are stable. She is here for hospital follow-up today. She continues to have abdominal distention, reports that swelling of legs has significantly improved, otherwise feeling well. She reports having loose nonbloody stools which is chronic. She denies hematemesis, melena, rectal bleeding, abdominal pain, nausea, vomiting. She recently had an ultrasound which revealed only trace ascites. Hep B surface antigen is negative, HCV antibody negative in 2017, ferritin 22 in 07/2016. She does not have a regular gi f/u for cirrhosis, and not on diuretics. She denies drinking ETOH. She reports that she stopped taking metformin and has been checking her blood glucose levels at home 4 times daily and numbers have been below 120  Follow-up visit 09/11/2017 Patient lost about 25 pounds since last visit after aggressive diuresis. I increased her Lasix to 40 mg and spironolactone 100 mg.  Patient also has pain restricting her sodium intake in her diet. She reports feeling well. Her abdominal distention and swelling of legs have completely resolved. She reports feeling slightly dizzy when she gets up in the morning. She is on nadolol for secondary prophylaxis. She denies any complaints today. She reports that her energy levels are better. She is taking oral iron. She denies black stools or rectal bleeding, hematemesis. Her most recent labs about 2 weeks ago revealed normal hemoglobin, mild thrombocytopenia. Her LFTs are almost normal, hypoalbuminemia resolved. She has preserved kidney function and electrolytes are normal  Follow-up visit 11/16/2017 She reports feeling well. She denies abdominal distention or swelling of legs. She continues to take Lasix 40 mg and spironolactone 100 mg daily. She is taking Protonix daily, and nadolol. Her last A1c was 6. She's not taking oral iron any more. She is no longer anemic. I performed her last EGD about 2 weeks ago, showed large esophageal varices with no red well signs. There was significant scarring from prior banding, therefore I did not perform ligation.  NSAIDs: none  Antiplts/Anticoagulants/Anti thrombotics: none  GI Procedures:  For EGD 11/01/2017 - Normal duodenal bulb and second portion of the duodenum. - Portal hypertensive gastropathy. - A few gastric polyps. - Large (> 5 mm) esophageal varices. - No specimens collected.  EGD 09/20/2017 - Normal duodenal bulb and second portion of the duodenum. - Portal hypertensive gastropathy. - Recently bleeding large (> 5 mm) esophageal varices. Incompletely eradicated. Bandedx3. - No specimens collected.  EGD 08/16/2017 - Normal duodenal bulb and second portion of the duodenum. - Portal hypertensive gastropathy. - Recently bleeding large (> 5  mm) esophageal varices. Incompletely eradicated. Banded x 4. - No specimens collected.  EGD 07/12/2017 - Normal duodenal bulb and second  portion of the duodenum. - Portal hypertensive gastropathy. - Bleeding large (> 5 mm) esophageal varices. Incompletely eradicated. Banded. - No specimens collected.  Colonoscopy 04/03/2005 normal  Colonoscopy 02/01/2015 - One 8 mm polyp in the ascending colon. Resected and retrieved. Clips were placed. - One 4 mm polyp in the transverse colon. Resected and retrieved. - One 7 mm polyp in the sigmoid colon. Resected and retrieved. Clip was placed. - Non-bleeding internal hemorrhoids. 1. Colon, polyp(s), ascending colon polyp - SESSILE SERRATED POLYP. - NO DYSPLASIA OR MALIGNANCY. 2. Colon, polyp(s), transverse colon polyp - TUBULAR ADENOMA. - NO HIGH GRADE DYSPLASIA OR MALIGNANCY. 3. Colon, polyp(s), sigmoid colon polyp - TUBULAR ADENOMA. - NO HIGH GRADE DYSPLASIA OR MALIGNANCY.   Past Medical History:  Diagnosis Date  . Allergy   . Arthritis   . Asthma   . Colon polyp   . Fatty liver   . GERD (gastroesophageal reflux disease)   . Hyperlipidemia   . Hypertension   . Motion sickness    boats    Past Surgical History:  Procedure Laterality Date  . ABDOMINAL HYSTERECTOMY  1978  . COLONOSCOPY WITH PROPOFOL N/A 02/01/2015   Procedure: COLONOSCOPY WITH PROPOFOL;  Surgeon: Lucilla Lame, MD;  Location: [facility];  Service: Endoscopy;  Laterality: N/A;  Diabetic - oral meds  . ESOPHAGOGASTRODUODENOSCOPY (EGD) WITH PROPOFOL N/A 07/12/2017   Procedure: ESOPHAGOGASTRODUODENOSCOPY (EGD) WITH PROPOFOL;  Surgeon: Lin Landsman, MD;  Location: [facility];  Service: Gastroenterology;  Laterality: N/A;  . ESOPHAGOGASTRODUODENOSCOPY (EGD) WITH PROPOFOL N/A 08/16/2017   Procedure: ESOPHAGOGASTRODUODENOSCOPY (EGD) WITH PROPOFOL;  Surgeon: Lin Landsman, MD;  Location: [facility] ENDOSCOPY;  Service: Gastroenterology;  Laterality: N/A;  . ESOPHAGOGASTRODUODENOSCOPY (EGD) WITH PROPOFOL N/A 09/20/2017   Procedure: ESOPHAGOGASTRODUODENOSCOPY (EGD) WITH PROPOFOL;  Surgeon: Lin Landsman, MD;  Location: [facility] ENDOSCOPY;  Service: Gastroenterology;  Laterality: N/A;  . ESOPHAGOGASTRODUODENOSCOPY (EGD) WITH PROPOFOL N/A 11/01/2017   Procedure: ESOPHAGOGASTRODUODENOSCOPY (EGD) WITH PROPOFOL;  Surgeon: Lin Landsman, MD;  Location: [facility] ENDOSCOPY;  Service: Gastroenterology;  Laterality: N/A;  . POLYPECTOMY  02/01/2015   Procedure: POLYPECTOMY;  Surgeon: Lucilla Lame, MD;  Location: [facility];  Service: Endoscopy;;  . TONSILLECTOMY AND ADENOIDECTOMY       Current Outpatient Medications:  .  albuterol (PROVENTIL HFA;VENTOLIN HFA) 108 (90 Base) MCG/ACT inhaler, Inhale 2 puffs into the lungs every 6 (six) hours as needed for wheezing or shortness of breath., Disp: 1 Inhaler, Rfl: 0 .  furosemide (LASIX) 40 MG tablet, Take 1 tablet (40 mg total) by mouth daily., Disp: 90 tablet, Rfl: 0 .  Lancets Misc. (ACCU-CHEK FASTCLIX LANCET) KIT, Test fasting blood sugar daily., Disp: 100 kit, Rfl: 3 .  magnesium oxide (MAG-OX) 400 MG tablet, Take 400 mg by mouth 2 (two) times daily., Disp: , Rfl:  .  nadolol (CORGARD) 20 MG tablet, Take 1 tablet (20 mg total) by mouth daily. Hold if BP is low SBP<90 or feeling dizzy, Disp: 30 tablet, Rfl: 2 .  pantoprazole (PROTONIX) 40 MG tablet, Take 1 tablet (40 mg total) by mouth daily before breakfast., Disp: 60 tablet, Rfl: 2 .  spironolactone (ALDACTONE) 100 MG tablet, TAKE 1 TABLET DAILY, Disp: 30 tablet, Rfl: 0 .  Vitamin D, Ergocalciferol, (DRISDOL) 50000 units CAPS capsule, TAKE 1 CAPSULE EVERY WEEK (NEED TO SCHEDULE FOLLOW UP LABS TO CHECK VITAMIN D LEVEL), Disp:  12 capsule, Rfl: 1 .  IRON PO, Take 45 mg by mouth., Disp: , Rfl:  .  metFORMIN (GLUCOPHAGE) 500 MG tablet, TAKE 1 TABLET TWICE DAILY WITH MEALS (NEED MD APPOINTMENT) (Patient not taking: Reported on 11/01/2017), Disp: 180 tablet, Rfl: 1   Family History  Problem Relation Age of Onset  . Dementia Mother   . Alcohol abuse Father   . Cancer Sister        lung  .  Lymphoma Sister   . Lymphoma Sister      Social History   Tobacco Use  . Smoking status: Former Smoker    Packs/day: 0.50    Years: 6.00    Pack years: 3.00    Types: Cigarettes    Last attempt to quit: 05/08/1987    Years since quitting: 30.5  . Smokeless tobacco: Never Used  Substance Use Topics  . Alcohol use: Never    Frequency: Never  . Drug use: Never    Allergies as of 11/16/2017 - Review Complete 11/16/2017  Allergen Reaction Noted  . Codeine Other (See Comments) 10/14/2014  . Hydrocodone-acetaminophen Other (See Comments) 10/14/2014  . Nitrofurantoin Other (See Comments) 11/05/2014  . Nitrofurantoin monohyd macro  10/14/2014  . Penicillins  10/14/2014  . Sulfa antibiotics Other (See Comments) 10/14/2014    Review of Systems:    All systems reviewed and negative except where noted in HPI.   Physical Exam:  BP 113/67 (BP Location: Left Arm, Patient Position: Sitting, Cuff Size: Normal)   Pulse 60   Resp 16   Ht 5' 1"  (1.549 m)   Wt 157 lb 3.2 oz (71.3 kg)   BMI 29.70 kg/m  No LMP recorded. Patient has had a hysterectomy.  General:   Alert,  Well-developed, well-nourished, pleasant and cooperative in NAD Head:  Normocephalic and atraumatic. Eyes:  Sclera clear, no icterus.   Conjunctiva pink. Ears:  Normal auditory acuity. Nose:  No deformity, discharge, or lesions. Mouth:  No deformity or lesions,oropharynx pink & moist. Neck:  Supple; no masses or thyromegaly. Lungs:  Respirations even and unlabored.  Clear throughout to auscultation.   No wheezes, crackles, or rhonchi. No acute distress. Heart:  Regular rate and rhythm; no murmurs, clicks, rubs, or gallops. Abdomen:  Normal bowel sounds. Soft, non-tender and non distended, no hepatosplenomegaly.  No guarding or rebound tenderness.   Rectal: Not performed Msk:  Symmetrical without gross deformities. Good, equal movement & strength bilaterally. Pulses:  Normal pulses noted. Extremities:  No clubbing, no  edema.  No cyanosis. Neurologic:  Alert and oriented x3;  grossly normal neurologically. Skin:  Intact without significant lesions or rashes. No jaundice. Psych:  Alert and cooperative. Normal mood and affect.  Imaging Studies: reviewed  Assessment and Plan:   RONNA HERSKOWITZ is a 77 y.o. Caucasian female with metabolic syndrome, diabetes, hypertension, hyperlipidemia seen for follow-up of decompensated cirrhosis with ascites and variceal bleed.  Decompensated cirrhosis: probably secondary to NASH in the setting of metabolic syndrome Hepatitis B and C negative, received hepatitis B vaccine in 2013. Normal ferritin levels Portal hypertension: manifested as ascites, thrombocytopenia, esophageal varices Volume overload: currently euvolemic, status post therapeutic paracentesis on 07/13/2017. She developed large volume ascites and responded very well to diuretics. Continue spironolactone 100 mg, and Lasix 40 mg daily, continue low-sodium diet. She may not need diuretics eventually. Esophageal varices: status post banding on 07/12/2017. EGD in 08/2017, ligation performed.  EGD in 10/2017, ligation was not performed due to extensive scarring from prior  banding. Continue nadolol. Plan to repeat EGD in 12 weeks Anemia/thrombocytopenia/coagulopathy:anemia resolved,does not have B12 or iron deficiency, mild thrombocytopenia and stable PSE: None HCC surveillance: normal AFP in 07/2017, ultrasound in 07/2017 and CT in 08/2017 with no liver lesions. Repeat imaging in 6 months HRS: None Check labs today She is immune to hepatitis A and B, received vaccinations in 2013 Received Pneumovax and prevnar  History of tubular adenomas: Last colonoscopy in 2016 Recommend repeat colonoscopy in next 1-2 months Patient is agreeable  Follow up in 3 months   Cephas Darby, MD

## 2017-11-20 ENCOUNTER — Other Ambulatory Visit: Payer: Self-pay

## 2017-11-20 DIAGNOSIS — Z86010 Personal history of colonic polyps: Secondary | ICD-10-CM

## 2017-11-22 ENCOUNTER — Ambulatory Visit: Payer: Medicare HMO | Admitting: Certified Registered Nurse Anesthetist

## 2017-11-22 ENCOUNTER — Encounter
Admission: RE | Disposition: A | Discharge status: Home / Self Care | Payer: Self-pay | Source: Ambulatory Visit | Attending: Gastroenterology

## 2017-11-22 ENCOUNTER — Ambulatory Visit
Admission: RE | Admit: 2017-11-22 | Discharge: 2017-11-22 | Disposition: A | Discharge status: Home / Self Care | Payer: Medicare HMO | Source: Ambulatory Visit | Attending: Gastroenterology | Admitting: Gastroenterology

## 2017-11-22 ENCOUNTER — Encounter: Payer: Self-pay | Admitting: Certified Registered Nurse Anesthetist

## 2017-11-22 DIAGNOSIS — Z87891 Personal history of nicotine dependence: Secondary | ICD-10-CM | POA: Insufficient documentation

## 2017-11-22 DIAGNOSIS — Z86010 Personal history of colonic polyps: Secondary | ICD-10-CM | POA: Diagnosis not present

## 2017-11-22 DIAGNOSIS — K219 Gastro-esophageal reflux disease without esophagitis: Secondary | ICD-10-CM | POA: Insufficient documentation

## 2017-11-22 DIAGNOSIS — K648 Other hemorrhoids: Secondary | ICD-10-CM | POA: Insufficient documentation

## 2017-11-22 DIAGNOSIS — I868 Varicose veins of other specified sites: Secondary | ICD-10-CM | POA: Insufficient documentation

## 2017-11-22 DIAGNOSIS — K635 Polyp of colon: Secondary | ICD-10-CM | POA: Diagnosis not present

## 2017-11-22 DIAGNOSIS — Z7984 Long term (current) use of oral hypoglycemic drugs: Secondary | ICD-10-CM | POA: Diagnosis not present

## 2017-11-22 DIAGNOSIS — E785 Hyperlipidemia, unspecified: Secondary | ICD-10-CM | POA: Diagnosis not present

## 2017-11-22 DIAGNOSIS — Z1211 Encounter for screening for malignant neoplasm of colon: Secondary | ICD-10-CM | POA: Insufficient documentation

## 2017-11-22 DIAGNOSIS — I10 Essential (primary) hypertension: Secondary | ICD-10-CM | POA: Diagnosis not present

## 2017-11-22 DIAGNOSIS — D122 Benign neoplasm of ascending colon: Secondary | ICD-10-CM | POA: Diagnosis not present

## 2017-11-22 DIAGNOSIS — Z79899 Other long term (current) drug therapy: Secondary | ICD-10-CM | POA: Diagnosis not present

## 2017-11-22 DIAGNOSIS — J45909 Unspecified asthma, uncomplicated: Secondary | ICD-10-CM | POA: Diagnosis not present

## 2017-11-22 DIAGNOSIS — E119 Type 2 diabetes mellitus without complications: Secondary | ICD-10-CM | POA: Insufficient documentation

## 2017-11-22 DIAGNOSIS — K6289 Other specified diseases of anus and rectum: Secondary | ICD-10-CM | POA: Diagnosis not present

## 2017-11-22 HISTORY — PX: COLONOSCOPY WITH PROPOFOL: SHX5780

## 2017-11-22 LAB — GLUCOSE, CAPILLARY: Glucose-Capillary: 129 mg/dL — ABNORMAL HIGH (ref 70–99)

## 2017-11-22 SURGERY — COLONOSCOPY WITH PROPOFOL
Anesthesia: General

## 2017-11-22 MED ORDER — PROPOFOL 500 MG/50ML IV EMUL
INTRAVENOUS | Status: AC
  Filled 2017-11-22: qty 50

## 2017-11-22 MED ORDER — PROPOFOL 10 MG/ML IV BOLUS
INTRAVENOUS | Status: DC | PRN
Start: 2017-11-22 — End: 2017-11-22
  Administered 2017-11-22: 50 mg via INTRAVENOUS

## 2017-11-22 MED ORDER — SODIUM CHLORIDE 0.9 % IV SOLN
INTRAVENOUS | Status: DC
  Administered 2017-11-22: 1000 mL via INTRAVENOUS

## 2017-11-22 MED ORDER — SODIUM CHLORIDE 0.9 % IV SOLN: INTRAVENOUS | Status: DC

## 2017-11-22 MED ORDER — LIDOCAINE HCL (CARDIAC) PF 100 MG/5ML IV SOSY
PREFILLED_SYRINGE | INTRAVENOUS | Status: DC | PRN
  Administered 2017-11-22: 50 mg via INTRAVENOUS

## 2017-11-22 MED ORDER — PROPOFOL 500 MG/50ML IV EMUL
INTRAVENOUS | Status: DC | PRN
  Administered 2017-11-22: 150 ug/kg/min via INTRAVENOUS

## 2017-11-22 NOTE — Anesthesia Post-op Follow-up Note (Signed)
Anesthesia QCDR form completed.        

## 2017-11-22 NOTE — Anesthesia Postprocedure Evaluation (Signed)
Anesthesia Post Note  Patient: Jennifer [name]  Procedure(s) Performed: COLONOSCOPY WITH PROPOFOL (N/A )  Patient location during evaluation: Endoscopy Anesthesia Type: General Level of consciousness: awake and alert Pain management: pain level controlled Vital Signs Assessment: post-procedure vital signs reviewed and stable Respiratory status: spontaneous breathing, nonlabored ventilation, respiratory function stable and patient connected to nasal cannula oxygen Cardiovascular status: blood pressure returned to baseline and stable Postop Assessment: no apparent nausea or vomiting Anesthetic complications: no     Last Vitals:  Vitals:   11/22/17 0842 11/22/17 0912  BP: (!) 109/52 119/62  Pulse: 64   Resp: 17   Temp: (!) 36.1 C   SpO2: 97%     Last Pain:  Vitals:   11/22/17 0912  TempSrc:   PainSc: 0-No pain                 Precious Haws Aalayah Riles

## 2017-11-22 NOTE — Transfer of Care (Signed)
Immediate Anesthesia Transfer of Care Note  Patient: STEFANIA GOULART  Procedure(s) Performed: COLONOSCOPY WITH PROPOFOL (N/A )  Patient Location: PACU  Anesthesia Type:General  Level of Consciousness: sedated  Airway & Oxygen Therapy: Patient Spontanous Breathing and Patient connected to nasal cannula oxygen  Post-op Assessment: Report given to RN and Post -op Vital signs reviewed and stable  Post vital signs: Reviewed and stable  Last Vitals:  Vitals Value Taken Time  BP 109/52 11/22/2017  8:42 AM  Temp 36.1 C 11/22/2017  8:42 AM  Pulse 62 11/22/2017  8:42 AM  Resp 16 11/22/2017  8:42 AM  SpO2 95 % 11/22/2017  8:42 AM  Vitals shown include unvalidated device data.  Last Pain:  Vitals:   11/22/17 0842  TempSrc: Tympanic  PainSc: 0-No pain         Complications: No apparent anesthesia complications

## 2017-11-22 NOTE — Anesthesia Procedure Notes (Signed)
Date/Time: 11/22/2017 8:18 AM Performed by: Johnna Acosta, CRNA Pre-anesthesia Checklist: Patient identified, Emergency Drugs available, Suction available, Patient being monitored and Timeout performed Patient Re-evaluated:Patient Re-evaluated prior to induction Oxygen Delivery Method: Nasal cannula Preoxygenation: Pre-oxygenation with 100% oxygen

## 2017-11-22 NOTE — Op Note (Signed)
[facility] Gastroenterology Patient Name: Jennifer [name] Procedure Date: 11/22/2017 8:07 AM MRN: [id number] Account #: [id number] Date of Birth: [birth year] Admit Type: Outpatient Age: 77 Room: [facility] ENDO ROOM 3 Gender: Female Note Status: Finalized Procedure:            Colonoscopy Indications:          High risk colon cancer surveillance: Personal history                        of adenoma less than 10 mm in size, Last colonoscopy:                        September 2016 Providers:            Lin Landsman MD, MD Referring MD:         Vickki Muff. Chrismon, MD (Referring MD) Medicines:            Monitored Anesthesia Care Complications:        No immediate complications. Estimated blood loss:                        Minimal. Procedure:            Pre-Anesthesia Assessment:                       - Prior to the procedure, a History and Physical was                        performed, and patient medications and allergies were                        reviewed. The patient is competent. The risks and                        benefits of the procedure and the sedation options and                        risks were discussed with the patient. All questions                        were answered and informed consent was obtained.                        Patient identification and proposed procedure were                        verified by the physician, the nurse, the                        anesthesiologist, the anesthetist and the technician in                        the pre-procedure area in the procedure room in the                        endoscopy suite. Mental Status Examination: alert and                        oriented. Airway Examination: normal oropharyngeal  airway and neck mobility. Respiratory Examination:                        clear to auscultation. CV Examination: normal.                        Prophylactic Antibiotics: The patient does not  require                        prophylactic antibiotics. Prior Anticoagulants: The                        patient has taken no previous anticoagulant or                        antiplatelet agents. ASA Grade Assessment: III - A                        patient with severe systemic disease. After reviewing                        the risks and benefits, the patient was deemed in                        satisfactory condition to undergo the procedure. The                        anesthesia plan was to use monitored anesthesia care                        (MAC). Immediately prior to administration of                        medications, the patient was re-assessed for adequacy                        to receive sedatives. The heart rate, respiratory rate,                        oxygen saturations, blood pressure, adequacy of                        pulmonary ventilation, and response to care were                        monitored throughout the procedure. The physical status                        of the patient was re-assessed after the procedure.                       After obtaining informed consent, the colonoscope was                        passed under direct vision. Throughout the procedure,                        the patient's blood pressure, pulse, and oxygen  saturations were monitored continuously. The                        Colonoscope was introduced through the anus and                        advanced to the the cecum, identified by appendiceal                        orifice and ileocecal valve. The colonoscopy was                        performed without difficulty. The patient tolerated the                        procedure well. The quality of the bowel preparation                        was evaluated using the [facility] Bowel Preparation                        Scale) with scores of: Right Colon = 3, Transverse                        Colon = 3 and Left Colon = 3  (entire mucosa seen well                        with no residual staining, small fragments of stool or                        opaque liquid). The total BBPS score equals 9. Findings:      The perianal and digital rectal examinations were normal. Pertinent       negatives include normal sphincter tone and no palpable rectal lesions.      A 5 mm polyp was found in the proximal ascending colon. The polyp was       sessile. The polyp was removed with a cold biopsy forceps. Resection and       retrieval were complete.      Large, non-bleeding rectal varices were found.      Non-bleeding internal hemorrhoids were found during retroflexion. The       hemorrhoids were large. Impression:           - One 5 mm polyp in the proximal ascending colon,                        removed with a cold biopsy forceps. Resected and                        retrieved.                       - Rectal varices.                       - Non-bleeding internal hemorrhoids. Recommendation:       - Discharge patient to home (with escort).                       - Low sodium diet.                       -  Await pathology results.                       - Repeat colonoscopy in 5 years for surveillance based                        on pathology results.                       - Return to my office as previously scheduled. Procedure Code(s):    --- Professional ---                       [phone], Colonoscopy, flexible; with biopsy, single or                        multiple Diagnosis Code(s):    --- Professional ---                       K64.8, Other hemorrhoids                       Z86.010, Personal history of colonic polyps                       D12.2, Benign neoplasm of ascending colon CPT copyright 2017 American Medical Association. All rights reserved. The codes documented in this report are preliminary and upon coder review may  be revised to meet current compliance requirements. Dr. Ulyess Mort Lin Landsman MD,  MD 11/22/2017 8:41:37 AM This report has been signed electronically. Number of Addenda: 0 Note Initiated On: 11/22/2017 8:07 AM Scope Withdrawal Time: 0 hours 11 minutes 55 seconds  Total Procedure Duration: 0 hours 17 minutes 17 seconds       [facility]

## 2017-11-22 NOTE — Anesthesia Preprocedure Evaluation (Signed)
Anesthesia Evaluation  Patient identified by MRN, date of birth, ID band Patient awake    Reviewed: Allergy & Precautions, H&P , NPO status , Patient's Chart, lab work & pertinent test results  History of Anesthesia Complications Negative for: history of anesthetic complications  Airway Mallampati: III  TM Distance: <3 FB Neck ROM: limited    Dental  (+) Chipped, Poor Dentition   Pulmonary neg shortness of breath, asthma , former smoker,           Cardiovascular Exercise Tolerance: Good hypertension, (-) angina(-) Past MI and (-) DOE      Neuro/Psych  Neuromuscular disease negative psych ROS   GI/Hepatic GERD  Controlled and Medicated,(+) Hepatitis -  Endo/Other  diabetes, Type 2  Renal/GU negative Renal ROS  negative genitourinary   Musculoskeletal  (+) Arthritis ,   Abdominal   Peds  Hematology negative hematology ROS (+)   Anesthesia Other Findings Past Medical History: No date: Allergy No date: Arthritis No date: Asthma No date: Colon polyp No date: Fatty liver No date: GERD (gastroesophageal reflux disease) No date: Hyperlipidemia No date: Hypertension No date: Motion sickness     Comment:  boats  Past Surgical History: 1978: ABDOMINAL HYSTERECTOMY 02/01/2015: COLONOSCOPY WITH PROPOFOL; N/A     Comment:  Procedure: COLONOSCOPY WITH PROPOFOL;  Surgeon: Lucilla Lame, MD;  Location: [place];  Service:               Endoscopy;  Laterality: N/A;  Diabetic - oral meds 07/12/2017: ESOPHAGOGASTRODUODENOSCOPY (EGD) WITH PROPOFOL; N/A     Comment:  Procedure: ESOPHAGOGASTRODUODENOSCOPY (EGD) WITH               PROPOFOL;  Surgeon: Lin Landsman, MD;  Location:               [facility] ENDOSCOPY;  Service: Gastroenterology;  Laterality:               N/A; 08/16/2017: ESOPHAGOGASTRODUODENOSCOPY (EGD) WITH PROPOFOL; N/A     Comment:  Procedure: ESOPHAGOGASTRODUODENOSCOPY (EGD) WITH         PROPOFOL;  Surgeon: Lin Landsman, MD;  Location:               [facility] ENDOSCOPY;  Service: Gastroenterology;  Laterality:               N/A; 02/01/2015: POLYPECTOMY     Comment:  Procedure: POLYPECTOMY;  Surgeon: Lucilla Lame, MD;                Location: [place];  Service: Endoscopy;; No date: TONSILLECTOMY AND ADENOIDECTOMY  BMI    Body Mass Index:  28.91 kg/m      Reproductive/Obstetrics negative OB ROS                             Anesthesia Physical  Anesthesia Plan  ASA: III  Anesthesia Plan: General   Post-op Pain Management:    Induction: Intravenous  PONV Risk Score and Plan: Propofol infusion and TIVA  Airway Management Planned: Natural Airway and Nasal Cannula  Additional Equipment:   Intra-op Plan:   Post-operative Plan:   Informed Consent: I have reviewed the patients History and Physical, chart, labs and discussed the procedure including the risks, benefits and alternatives for the proposed anesthesia with the patient or authorized representative who has indicated his/her understanding and acceptance.  Dental Advisory Given  Plan Discussed with: Anesthesiologist, CRNA and Surgeon  Anesthesia Plan Comments: (Patient consented for risks of anesthesia including but not limited to:  - adverse reactions to medications - risk of intubation if required - damage to teeth, lips or other oral mucosa - sore throat or hoarseness - Damage to heart, brain, lungs or loss of life  Patient voiced understanding.)        Anesthesia Quick Evaluation

## 2017-11-22 NOTE — H&P (Signed)
Cephas Darby, MD [address]  [place]  [place], [place] [address]  Main: [phone]  Fax: [phone] Pager: [phone]  Primary Care Physician:  Margo Common, PA Primary Gastroenterologist:  Dr. Cephas Darby  Pre-Procedure History & Physical: HPI:  Jennifer [name] is a 77 y.o. female is here for an colonoscopy.   Past Medical History:  Diagnosis Date  . Allergy   . Arthritis   . Asthma   . Colon polyp   . Fatty liver   . GERD (gastroesophageal reflux disease)   . Hyperlipidemia   . Hypertension   . Motion sickness    boats    Past Surgical History:  Procedure Laterality Date  . ABDOMINAL HYSTERECTOMY  1978  . COLONOSCOPY WITH PROPOFOL N/A 02/01/2015   Procedure: COLONOSCOPY WITH PROPOFOL;  Surgeon: Lucilla Lame, MD;  Location: [place];  Service: Endoscopy;  Laterality: N/A;  Diabetic - oral meds  . ESOPHAGOGASTRODUODENOSCOPY (EGD) WITH PROPOFOL N/A 07/12/2017   Procedure: ESOPHAGOGASTRODUODENOSCOPY (EGD) WITH PROPOFOL;  Surgeon: Lin Landsman, MD;  Location: [place];  Service: Gastroenterology;  Laterality: N/A;  . ESOPHAGOGASTRODUODENOSCOPY (EGD) WITH PROPOFOL N/A 08/16/2017   Procedure: ESOPHAGOGASTRODUODENOSCOPY (EGD) WITH PROPOFOL;  Surgeon: Lin Landsman, MD;  Location: [facility] ENDOSCOPY;  Service: Gastroenterology;  Laterality: N/A;  . ESOPHAGOGASTRODUODENOSCOPY (EGD) WITH PROPOFOL N/A 09/20/2017   Procedure: ESOPHAGOGASTRODUODENOSCOPY (EGD) WITH PROPOFOL;  Surgeon: Lin Landsman, MD;  Location: [facility] ENDOSCOPY;  Service: Gastroenterology;  Laterality: N/A;  . ESOPHAGOGASTRODUODENOSCOPY (EGD) WITH PROPOFOL N/A 11/01/2017   Procedure: ESOPHAGOGASTRODUODENOSCOPY (EGD) WITH PROPOFOL;  Surgeon: Lin Landsman, MD;  Location: [facility] ENDOSCOPY;  Service: Gastroenterology;  Laterality: N/A;  . POLYPECTOMY  02/01/2015   Procedure: POLYPECTOMY;  Surgeon: Lucilla Lame, MD;  Location: [place];  Service: Endoscopy;;    . TONSILLECTOMY AND ADENOIDECTOMY      Prior to Admission medications   Medication Sig Start Date End Date Taking? Authorizing Provider  albuterol (PROVENTIL HFA;VENTOLIN HFA) 108 (90 Base) MCG/ACT inhaler Inhale 2 puffs into the lungs every 6 (six) hours as needed for wheezing or shortness of breath. 03/30/17  Yes Chrismon, Vickki Muff, PA  furosemide (LASIX) 40 MG tablet Take 1 tablet (40 mg total) by mouth daily. 11/01/17 01/30/18 Yes Olanna Percifield, Tally Due, MD  IRON PO Take 45 mg by mouth.   Yes [provider]  Lancets Misc. (ACCU-CHEK FASTCLIX LANCET) KIT Test fasting blood sugar daily. 12/05/15  Yes Chrismon, Vickki Muff, PA  magnesium oxide (MAG-OX) 400 MG tablet Take 400 mg by mouth 2 (two) times daily.   Yes [provider]  metFORMIN (GLUCOPHAGE) 500 MG tablet TAKE 1 TABLET TWICE DAILY WITH MEALS (NEED MD APPOINTMENT) 09/20/17  Yes Chrismon, Vickki Muff, PA  nadolol (CORGARD) 20 MG tablet Take 1 tablet (20 mg total) by mouth daily. Hold if BP is low SBP<90 or feeling dizzy 10/08/17  Yes Brodie Correll, Tally Due, MD  pantoprazole (PROTONIX) 40 MG tablet Take 1 tablet (40 mg total) by mouth daily before breakfast. 11/01/17 01/30/18 Yes Simrah Chatham, Tally Due, MD  spironolactone (ALDACTONE) 100 MG tablet TAKE 1 TABLET DAILY 10/09/17  Yes Fread Kottke, Tally Due, MD  Vitamin D, Ergocalciferol, (DRISDOL) 50000 units CAPS capsule TAKE 1 CAPSULE EVERY WEEK (NEED TO SCHEDULE FOLLOW UP LABS TO CHECK VITAMIN D LEVEL) 09/14/17  Yes Chrismon, Vickki Muff, PA    Allergies as of 11/16/2017 - Review Complete 11/16/2017  Allergen Reaction Noted  . Codeine Other (See Comments) 10/14/2014  . Hydrocodone-acetaminophen Other (  See Comments) 10/14/2014  . Nitrofurantoin Other (See Comments) 11/05/2014  . Nitrofurantoin monohyd macro  10/14/2014  . Penicillins  10/14/2014  . Sulfa antibiotics Other (See Comments) 10/14/2014    Family History  Problem Relation Age of Onset  . Dementia Mother   . Alcohol abuse Father    . Cancer Sister        lung  . Lymphoma Sister   . Lymphoma Sister     Social History   Socioeconomic History  . Marital status: Widowed    Spouse name: Not on file  . Number of children: 2  . Years of education: Not on file  . Highest education level: Not on file  Occupational History  . Occupation: retired  Scientific laboratory technician  . Financial resource strain: Not hard at all  . Food insecurity:    Worry: Never true    Inability: Never true  . Transportation needs:    Medical: No    Non-medical: No  Tobacco Use  . Smoking status: Former Smoker    Packs/day: 0.50    Years: 6.00    Pack years: 3.00    Types: Cigarettes    Last attempt to quit: 05/08/1987    Years since quitting: 30.5  . Smokeless tobacco: Never Used  Substance and Sexual Activity  . Alcohol use: Never    Frequency: Never  . Drug use: Never  . Sexual activity: Not on file  Lifestyle  . Physical activity:    Days per week: Not on file    Minutes per session: Not on file  . Stress: Not at all  Relationships  . Social connections:    Talks on phone: Not on file    Gets together: Not on file    Attends religious service: Not on file    Active member of club or organization: Not on file    Attends meetings of clubs or organizations: Not on file    Relationship status: Not on file  . Intimate partner violence:    Fear of current or ex partner: Not on file    Emotionally abused: Not on file    Physically abused: Not on file    Forced sexual activity: Not on file  Other Topics Concern  . Not on file  Social History Narrative  . Not on file    Review of Systems: See HPI, otherwise negative ROS  Physical Exam: BP 122/72   Pulse 63   Temp (!) 95.5 F (35.3 C) (Tympanic)   Resp 16   Ht 5' 1"  (1.549 m)   Wt 157 lb (71.2 kg)   SpO2 99%   BMI 29.66 kg/m  General:   Alert,  pleasant and cooperative in NAD Head:  Normocephalic and atraumatic. Neck:  Supple; no masses or thyromegaly. Lungs:  Clear  throughout to auscultation.    Heart:  Regular rate and rhythm. Abdomen:  Soft, nontender and nondistended. Normal bowel sounds, without guarding, and without rebound.   Neurologic:  Alert and  oriented x4;  grossly normal neurologically.  Impression/Plan: Jennifer [name] is here for an colonoscopy to be performed for h/o colon polyps  Risks, benefits, limitations, and alternatives regarding  colonoscopy have been reviewed with the patient.  Questions have been answered.  All parties agreeable.   Sherri Sear, MD  11/22/2017, 8:11 AM

## 2017-11-23 ENCOUNTER — Encounter: Payer: Self-pay | Admitting: Gastroenterology

## 2017-11-23 LAB — SURGICAL PATHOLOGY

## 2017-11-26 ENCOUNTER — Encounter: Payer: Self-pay | Admitting: Gastroenterology

## 2017-11-27 ENCOUNTER — Other Ambulatory Visit: Payer: Self-pay | Admitting: Gastroenterology

## 2017-12-06 ENCOUNTER — Other Ambulatory Visit: Payer: Self-pay | Admitting: Gastroenterology

## 2017-12-12 ENCOUNTER — Telehealth: Payer: Self-pay | Admitting: Gastroenterology

## 2017-12-12 NOTE — Telephone Encounter (Signed)
Pt left vm to speak to [facility]

## 2017-12-18 ENCOUNTER — Other Ambulatory Visit: Payer: Self-pay | Admitting: Gastroenterology

## 2017-12-21 ENCOUNTER — Other Ambulatory Visit: Payer: Self-pay | Admitting: Gastroenterology

## 2017-12-26 ENCOUNTER — Other Ambulatory Visit: Payer: Self-pay | Admitting: Gastroenterology

## 2017-12-26 ENCOUNTER — Other Ambulatory Visit: Payer: Self-pay | Admitting: Family Medicine

## 2018-01-01 ENCOUNTER — Other Ambulatory Visit: Payer: Self-pay | Admitting: Gastroenterology

## 2018-01-16 ENCOUNTER — Other Ambulatory Visit: Payer: Self-pay | Admitting: *Deleted

## 2018-01-16 ENCOUNTER — Encounter: Payer: Self-pay | Admitting: Internal Medicine

## 2018-01-16 ENCOUNTER — Inpatient Hospital Stay: Payer: Medicare HMO | Attending: Internal Medicine

## 2018-01-16 ENCOUNTER — Inpatient Hospital Stay (HOSPITAL_BASED_OUTPATIENT_CLINIC_OR_DEPARTMENT_OTHER): Payer: Medicare HMO | Admitting: Internal Medicine

## 2018-01-16 VITALS — BP 117/73 | HR 48 | Temp 97.9°F | Resp 16 | Wt 164.0 lb

## 2018-01-16 DIAGNOSIS — K7469 Other cirrhosis of liver: Secondary | ICD-10-CM

## 2018-01-16 DIAGNOSIS — E871 Hypo-osmolality and hyponatremia: Secondary | ICD-10-CM | POA: Diagnosis not present

## 2018-01-16 DIAGNOSIS — Z87891 Personal history of nicotine dependence: Secondary | ICD-10-CM | POA: Insufficient documentation

## 2018-01-16 DIAGNOSIS — D696 Thrombocytopenia, unspecified: Secondary | ICD-10-CM

## 2018-01-16 DIAGNOSIS — D731 Hypersplenism: Secondary | ICD-10-CM | POA: Diagnosis not present

## 2018-01-16 DIAGNOSIS — D6959 Other secondary thrombocytopenia: Secondary | ICD-10-CM | POA: Diagnosis not present

## 2018-01-16 DIAGNOSIS — R252 Cramp and spasm: Secondary | ICD-10-CM

## 2018-01-16 LAB — COMPREHENSIVE METABOLIC PANEL
ALT: 45 U/L — ABNORMAL HIGH (ref 0–44)
ANION GAP: 6 (ref 5–15)
AST: 68 U/L — ABNORMAL HIGH (ref 15–41)
Albumin: 3.7 g/dL (ref 3.5–5.0)
Alkaline Phosphatase: 60 U/L (ref 38–126)
BUN: 18 mg/dL (ref 8–23)
CHLORIDE: 92 mmol/L — AB (ref 98–111)
CO2: 29 mmol/L (ref 22–32)
Calcium: 9.2 mg/dL (ref 8.9–10.3)
Creatinine, Ser: 1.05 mg/dL — ABNORMAL HIGH (ref 0.44–1.00)
GFR, EST AFRICAN AMERICAN: 57 mL/min — AB (ref >60)
GFR, EST NON AFRICAN AMERICAN: 50 mL/min — AB (ref >60)
Glucose, Bld: 166 mg/dL — ABNORMAL HIGH (ref 70–99)
Potassium: 4.8 mmol/L (ref 3.5–5.1)
SODIUM: 127 mmol/L — AB (ref 135–145)
Total Bilirubin: 1.1 mg/dL (ref 0.3–1.2)
Total Protein: 6.6 g/dL (ref 6.5–8.1)

## 2018-01-16 LAB — CBC WITH DIFFERENTIAL/PLATELET
Basophils Absolute: 0 10*3/uL (ref 0–0.1)
Basophils Relative: 1 %
Eosinophils Absolute: 0 10*3/uL (ref 0–0.7)
Eosinophils Relative: 2 %
HEMATOCRIT: 34.5 % — AB (ref 35.0–47.0)
Hemoglobin: 12 g/dL (ref 12.0–16.0)
LYMPHS PCT: 23 %
Lymphs Abs: 0.7 10*3/uL — ABNORMAL LOW (ref 1.0–3.6)
MCH: 31.7 pg (ref 26.0–34.0)
MCHC: 34.8 g/dL (ref 32.0–36.0)
MCV: 91.1 fL (ref 80.0–100.0)
MONOS PCT: 8 %
Monocytes Absolute: 0.2 10*3/uL (ref 0.2–0.9)
NEUTROS ABS: 1.9 10*3/uL (ref 1.4–6.5)
Neutrophils Relative %: 66 %
Platelets: 63 10*3/uL — ABNORMAL LOW (ref 150–440)
RBC: 3.79 MIL/uL — ABNORMAL LOW (ref 3.80–5.20)
RDW: 14.1 % (ref 11.5–14.5)
WBC: 2.9 10*3/uL — ABNORMAL LOW (ref 3.6–11.0)

## 2018-01-16 NOTE — Assessment & Plan Note (Addendum)
#   Thrombocytopenia secondary to hypersplenism from splenomegaly.  Platelets today 63; but no bleeding. STABLE.    # Cirrhosis/recent decompensation s/p paracentesis-currently STABLE. Defer to GI re: surveillance of [place].   # Hyponatremia-sodium 127; new ? Cramps-;  sec to lasix; recommend cutting down to lasix 41m/day. Continue aldactone/nadalol.   # constipation-continue miralax /Dulcolax.   # follow up 6 months/labs-CBC CMP PT PTT AFP; add magnesium today.   Cc; [place]/ Crismon.

## 2018-01-16 NOTE — Progress Notes (Signed)
[place] NOTE  Patient Care Team: Chrismon, Vickki Muff, PA as PCP - General (Family Medicine) Bary Castilla, Forest Gleason, MD (General Surgery) Anell Barr, OD as Consulting Physician (Optometry) Cammie Sickle, MD as Consulting Physician (Internal Medicine) Lin Landsman, MD as Consulting Physician (Gastroenterology)  CHIEF COMPLAINTS/PURPOSE OF CONSULTATION:   # THROMBOCYTOPENIA- [2015-100s-80s; 2017] N-Hb/WBC;  Hypersplenism/splenomegaly; no clumping.   #  Cirrhosis/ splenomegaly [US- 2016; stable since 2013]; Hep B/Hep C negative on 12/29/15; DECOMPENSATION of CIRRHOSIS [feb 2019; Dr.Vanga]  HISTORY OF PRESENTING ILLNESS:  Jennifer [name] 77 y.o.  female Caucasian patient history of thrombocytopenia/compensated cirrhosis/splenomegaly [based on imaging] is here for follow-up.  Patient complains of intermittent cramps in the legs.  She has been taking magnesium p.o. over-the-counter.  Currently she denies any blood in stools black or stools.  Denies any nausea vomiting.  Denies any easy bruising or bleeding gums or nose.  Review of Systems  Constitutional: Negative for chills, diaphoresis, fever, malaise/fatigue and weight loss.  HENT: Negative for nosebleeds and sore throat.   Eyes: Negative for double vision.  Respiratory: Negative for cough, hemoptysis, sputum production, shortness of breath and wheezing.   Cardiovascular: Negative for chest pain, palpitations, orthopnea and leg swelling.  Gastrointestinal: Negative for abdominal pain, blood in stool, constipation, diarrhea, heartburn, melena, nausea and vomiting.  Genitourinary: Negative for dysuria, frequency and urgency.  Musculoskeletal: Positive for myalgias and neck pain. Negative for back pain and joint pain.  Skin: Negative.  Negative for itching and rash.  Neurological: Negative for dizziness, tingling, focal weakness, weakness and headaches.  Endo/Heme/Allergies: Bruises/bleeds  easily.  Psychiatric/Behavioral: Negative for depression. The patient is not nervous/anxious and does not have insomnia.      MEDICAL HISTORY:  Past Medical History:  Diagnosis Date  . Allergy   . Arthritis   . Asthma   . Colon polyp   . Fatty liver   . GERD (gastroesophageal reflux disease)   . Hyperlipidemia   . Hypertension   . Motion sickness    boats    SURGICAL HISTORY: Past Surgical History:  Procedure Laterality Date  . ABDOMINAL HYSTERECTOMY  1978  . COLONOSCOPY WITH PROPOFOL N/A 02/01/2015   Procedure: COLONOSCOPY WITH PROPOFOL;  Surgeon: Lucilla Lame, MD;  Location: [place];  Service: Endoscopy;  Laterality: N/A;  Diabetic - oral meds  . COLONOSCOPY WITH PROPOFOL N/A 11/22/2017   Procedure: COLONOSCOPY WITH PROPOFOL;  Surgeon: Lin Landsman, MD;  Location: [facility] ENDOSCOPY;  Service: Gastroenterology;  Laterality: N/A;  . ESOPHAGOGASTRODUODENOSCOPY (EGD) WITH PROPOFOL N/A 07/12/2017   Procedure: ESOPHAGOGASTRODUODENOSCOPY (EGD) WITH PROPOFOL;  Surgeon: Lin Landsman, MD;  Location: [facility] ENDOSCOPY;  Service: Gastroenterology;  Laterality: N/A;  . ESOPHAGOGASTRODUODENOSCOPY (EGD) WITH PROPOFOL N/A 08/16/2017   Procedure: ESOPHAGOGASTRODUODENOSCOPY (EGD) WITH PROPOFOL;  Surgeon: Lin Landsman, MD;  Location: [facility] ENDOSCOPY;  Service: Gastroenterology;  Laterality: N/A;  . ESOPHAGOGASTRODUODENOSCOPY (EGD) WITH PROPOFOL N/A 09/20/2017   Procedure: ESOPHAGOGASTRODUODENOSCOPY (EGD) WITH PROPOFOL;  Surgeon: Lin Landsman, MD;  Location: [facility] ENDOSCOPY;  Service: Gastroenterology;  Laterality: N/A;  . ESOPHAGOGASTRODUODENOSCOPY (EGD) WITH PROPOFOL N/A 11/01/2017   Procedure: ESOPHAGOGASTRODUODENOSCOPY (EGD) WITH PROPOFOL;  Surgeon: Lin Landsman, MD;  Location: [facility] ENDOSCOPY;  Service: Gastroenterology;  Laterality: N/A;  . POLYPECTOMY  02/01/2015   Procedure: POLYPECTOMY;  Surgeon: Lucilla Lame, MD;  Location: [place];  Service:  Endoscopy;;  . TONSILLECTOMY AND ADENOIDECTOMY      SOCIAL HISTORY: Social History   Socioeconomic History  .  Marital status: Widowed    Spouse name: Not on file  . Number of children: 2  . Years of education: Not on file  . Highest education level: Not on file  Occupational History  . Occupation: retired  Scientific laboratory technician  . Financial resource strain: Not hard at all  . Food insecurity:    Worry: Never true    Inability: Never true  . Transportation needs:    Medical: No    Non-medical: No  Tobacco Use  . Smoking status: Former Smoker    Packs/day: 0.50    Years: 6.00    Pack years: 3.00    Types: Cigarettes    Last attempt to quit: 05/08/1987    Years since quitting: 30.7  . Smokeless tobacco: Never Used  Substance and Sexual Activity  . Alcohol use: Never    Frequency: Never  . Drug use: Never  . Sexual activity: Not on file  Lifestyle  . Physical activity:    Days per week: Not on file    Minutes per session: Not on file  . Stress: Not at all  Relationships  . Social connections:    Talks on phone: Not on file    Gets together: Not on file    Attends religious service: Not on file    Active member of club or organization: Not on file    Attends meetings of clubs or organizations: Not on file    Relationship status: Not on file  . Intimate partner violence:    Fear of current or ex partner: Not on file    Emotionally abused: Not on file    Physically abused: Not on file    Forced sexual activity: Not on file  Other Topics Concern  . Not on file  Social History Narrative  . Not on file    FAMILY HISTORY: Family History  Problem Relation Age of Onset  . Dementia Mother   . Alcohol abuse Father   . Cancer Sister        lung  . Lymphoma Sister   . Lymphoma Sister     ALLERGIES:  is allergic to codeine; hydrocodone-acetaminophen; nitrofurantoin; nitrofurantoin monohyd macro; penicillins; and sulfa antibiotics.  MEDICATIONS:  Current Outpatient  Medications  Medication Sig Dispense Refill  . albuterol (PROVENTIL HFA;VENTOLIN HFA) 108 (90 Base) MCG/ACT inhaler Inhale 2 puffs into the lungs every 6 (six) hours as needed for wheezing or shortness of breath. 1 Inhaler 0  . furosemide (LASIX) 40 MG tablet TAKE ONE TABLET BY MOUTH EVERY DAY 90 tablet 0  . Lancets Misc. (ACCU-CHEK FASTCLIX LANCET) KIT Test fasting blood sugar daily. 100 kit 3  . magnesium oxide (MAG-OX) 400 MG tablet Take 400 mg by mouth 2 (two) times daily.    . nadolol (CORGARD) 20 MG tablet TAKE ONE TABLET BY MOUTH EVERY DAY. HOLDIF BLOOD PRESSURE IS LOW (SBP<90) OR FEELING DIZZY. 30 tablet 2  . pantoprazole (PROTONIX) 40 MG tablet TAKE ONE TABLET BY MOUTH TWICE DAILY 60 tablet 2  . spironolactone (ALDACTONE) 100 MG tablet TAKE ONE TABLET EVERY DAY 30 tablet 0  . Vitamin D, Ergocalciferol, (DRISDOL) 50000 units CAPS capsule TAKE 1 CAPSULE EVERY WEEK (NEED TO SCHEDULE FOLLOW UP LABS TO CHECK VITAMIN D LEVEL) 12 capsule 1  . IRON PO Take 45 mg by mouth.    . metFORMIN (GLUCOPHAGE) 500 MG tablet TAKE 1 TABLET TWICE DAILY WITH MEALS (NEED MD APPOINTMENT) (Patient not taking: Reported on 01/16/2018) 180 tablet 1  No current facility-administered medications for this visit.       Marland Kitchen  PHYSICAL EXAMINATION:   Vitals:   01/16/18 1045 01/16/18 1047  BP:  117/73  Pulse:  (!) 48  Resp: 16 16  Temp:  97.9 F (36.6 C)   Filed Weights   01/16/18 1047  Weight: 164 lb (74.4 kg)    GENERAL: Well-nourished well-developed; Alert, no distress and comfortable.  Accompanied by her daughter. EYES: no pallor or icterus OROPHARYNX: no thrush or ulceration; good dentition  NECK: supple, no masses felt LYMPH:  no palpable lymphadenopathy in the cervical, axillary or inguinal regions LUNGS: clear to auscultation and  No wheeze or crackles HEART/CVS: regular rate & rhythm and no murmurs; 3+ bilateral lower extremity edema ABDOMEN: abdomen soft, non-tender and normal bowel sounds;  positive for splenomegaly.  Musculoskeletal:no cyanosis of digits and no clubbing  PSYCH: alert & oriented x 3 with fluent speech NEURO: no focal motor/sensory deficits SKIN:  no rashes;   LABORATORY DATA:  I have reviewed the data as listed Lab Results  Component Value Date   WBC 2.9 (L) 01/16/2018   HGB 12.0 01/16/2018   HCT 34.5 (L) 01/16/2018   MCV 91.1 01/16/2018   PLT 63 (L) 01/16/2018   Recent Labs    08/30/17 1008 11/16/17 1049 01/16/18 1016  NA 138 135 127*  K 4.2 4.3 4.8  CL 97 96* 92*  CO2 _0 GLUCOSE 117* 143* 166*  BUN 18 27* 18  CREATININE 0.89 1.16* 1.05*  CALCIUM 9.7 10.0 9.2  GFRNONAA 63 44* 50*  GFRAA 72 51* 57*  PROT 6.8 7.3 6.6  ALBUMIN 3.7 4.0 3.7  AST 42* 40 68*  ALT 15 21 45*  ALKPHOS 70 57 60  BILITOT 0.8 1.3* 1.1    RADIOGRAPHIC STUDIES: I have personally reviewed the radiological images as listed and agreed with the findings in the report. No results found.  ASSESSMENT & PLAN:  Thrombocytopenia ([place]) # Thrombocytopenia secondary to hypersplenism from splenomegaly.  Platelets today 63; but no bleeding. STABLE.    # Cirrhosis/recent decompensation s/p paracentesis-currently STABLE. Defer to GI re: surveillance of [place].   # Hyponatremia-sodium 127; new ? Cramps-;  sec to lasix; recommend cutting down to lasix 46m/day. Continue aldactone/nadalol.   # constipation-continue miralax /Dulcolax.   # follow up 6 months/labs-CBC CMP PT PTT AFP; add magnesium today.   Cc; Vanga/ Crismon.     GCammie Sickle MD 01/16/2018 5:07 PM

## 2018-01-17 ENCOUNTER — Ambulatory Visit: Payer: Medicare HMO | Admitting: Anesthesiology

## 2018-01-17 ENCOUNTER — Encounter: Payer: Self-pay | Admitting: Anesthesiology

## 2018-01-17 ENCOUNTER — Ambulatory Visit
Admission: RE | Admit: 2018-01-17 | Discharge: 2018-01-17 | Disposition: A | Discharge status: Home / Self Care | Payer: Medicare HMO | Source: Ambulatory Visit | Attending: Gastroenterology | Admitting: Gastroenterology

## 2018-01-17 ENCOUNTER — Encounter
Admission: RE | Disposition: A | Discharge status: Home / Self Care | Payer: Self-pay | Source: Ambulatory Visit | Attending: Gastroenterology

## 2018-01-17 DIAGNOSIS — I8500 Esophageal varices without bleeding: Secondary | ICD-10-CM | POA: Insufficient documentation

## 2018-01-17 DIAGNOSIS — J45909 Unspecified asthma, uncomplicated: Secondary | ICD-10-CM | POA: Insufficient documentation

## 2018-01-17 DIAGNOSIS — I10 Essential (primary) hypertension: Secondary | ICD-10-CM | POA: Insufficient documentation

## 2018-01-17 DIAGNOSIS — Z7984 Long term (current) use of oral hypoglycemic drugs: Secondary | ICD-10-CM | POA: Insufficient documentation

## 2018-01-17 DIAGNOSIS — K766 Portal hypertension: Secondary | ICD-10-CM | POA: Insufficient documentation

## 2018-01-17 DIAGNOSIS — K7460 Unspecified cirrhosis of liver: Secondary | ICD-10-CM | POA: Diagnosis not present

## 2018-01-17 DIAGNOSIS — E119 Type 2 diabetes mellitus without complications: Secondary | ICD-10-CM | POA: Diagnosis not present

## 2018-01-17 DIAGNOSIS — Z87891 Personal history of nicotine dependence: Secondary | ICD-10-CM | POA: Diagnosis not present

## 2018-01-17 DIAGNOSIS — K7581 Nonalcoholic steatohepatitis (NASH): Secondary | ICD-10-CM

## 2018-01-17 DIAGNOSIS — Z86010 Personal history of colonic polyps: Secondary | ICD-10-CM

## 2018-01-17 DIAGNOSIS — K317 Polyp of stomach and duodenum: Secondary | ICD-10-CM

## 2018-01-17 DIAGNOSIS — K648 Other hemorrhoids: Secondary | ICD-10-CM

## 2018-01-17 DIAGNOSIS — D122 Benign neoplasm of ascending colon: Secondary | ICD-10-CM

## 2018-01-17 DIAGNOSIS — Z79899 Other long term (current) drug therapy: Secondary | ICD-10-CM | POA: Insufficient documentation

## 2018-01-17 DIAGNOSIS — D131 Benign neoplasm of stomach: Secondary | ICD-10-CM | POA: Diagnosis not present

## 2018-01-17 DIAGNOSIS — E785 Hyperlipidemia, unspecified: Secondary | ICD-10-CM | POA: Insufficient documentation

## 2018-01-17 DIAGNOSIS — K7469 Other cirrhosis of liver: Secondary | ICD-10-CM

## 2018-01-17 DIAGNOSIS — K219 Gastro-esophageal reflux disease without esophagitis: Secondary | ICD-10-CM | POA: Diagnosis not present

## 2018-01-17 HISTORY — PX: ESOPHAGOGASTRODUODENOSCOPY (EGD) WITH PROPOFOL: SHX5813

## 2018-01-17 SURGERY — ESOPHAGOGASTRODUODENOSCOPY (EGD) WITH PROPOFOL
Anesthesia: General

## 2018-01-17 MED ORDER — LIDOCAINE HCL (PF) 2 % IJ SOLN
INTRAMUSCULAR | Status: AC
  Filled 2018-01-17: qty 10

## 2018-01-17 MED ORDER — PROPOFOL 500 MG/50ML IV EMUL
INTRAVENOUS | Status: DC | PRN
  Administered 2018-01-17: 130 ug/kg/min via INTRAVENOUS

## 2018-01-17 MED ORDER — FUROSEMIDE 40 MG PO TABS: 20.0000 mg | ORAL_TABLET | Freq: Every day | ORAL | 0 refills | Status: DC

## 2018-01-17 MED ORDER — LIDOCAINE HCL (CARDIAC) PF 100 MG/5ML IV SOSY
PREFILLED_SYRINGE | INTRAVENOUS | Status: DC | PRN
  Administered 2018-01-17: 50 mg via INTRAVENOUS

## 2018-01-17 MED ORDER — PROPOFOL 500 MG/50ML IV EMUL
INTRAVENOUS | Status: AC
  Filled 2018-01-17: qty 50

## 2018-01-17 MED ORDER — PROPOFOL 10 MG/ML IV BOLUS
INTRAVENOUS | Status: DC | PRN
  Administered 2018-01-17: 80 mg via INTRAVENOUS
  Administered 2018-01-17: 20 mg via INTRAVENOUS

## 2018-01-17 MED ORDER — SODIUM CHLORIDE 0.9 % IV SOLN
INTRAVENOUS | Status: DC
  Administered 2018-01-17: 1000 mL via INTRAVENOUS

## 2018-01-17 NOTE — Op Note (Signed)
[facility] Patient Name: Jennifer [name] Procedure Date: 01/17/2018 8:10 AM MRN: [id number] Account #: [id number] Date of Birth: 12-25-1940 Admit Type: Outpatient Age: 77 Room: [facility] ENDO ROOM 2 Gender: Female Note Status: Finalized Procedure:            Upper GI endoscopy Indications:          Esophageal varices, Follow-up of esophageal varices,                        For therapy of esophageal varices Providers:            Lin Landsman MD, MD Medicines:            Monitored Anesthesia Care Complications:        No immediate complications. Estimated blood loss:                        Minimal. Procedure:            Pre-Anesthesia Assessment:                       - Prior to the procedure, a History and Physical was                        performed, and patient medications and allergies were                        reviewed. The patient is competent. The risks and                        benefits of the procedure and the sedation options and                        risks were discussed with the patient. All questions                        were answered and informed consent was obtained.                        Patient identification and proposed procedure were                        verified by the physician, the nurse, the                        anesthesiologist, the anesthetist and the technician in                        the pre-procedure area in the procedure room in the                        endoscopy suite. Mental Status Examination: alert and                        oriented. Airway Examination: normal oropharyngeal                        airway and neck mobility. Respiratory Examination:                        clear to  auscultation. CV Examination: normal.                        Prophylactic Antibiotics: The patient does not require                        prophylactic antibiotics. Prior Anticoagulants: The                        patient has  taken no previous anticoagulant or                        antiplatelet agents. ASA Grade Assessment: III - A                        patient with severe systemic disease. After reviewing                        the risks and benefits, the patient was deemed in                        satisfactory condition to undergo the procedure. The                        anesthesia plan was to use monitored anesthesia care                        (MAC). Immediately prior to administration of                        medications, the patient was re-assessed for adequacy                        to receive sedatives. The heart rate, respiratory rate,                        oxygen saturations, blood pressure, adequacy of                        pulmonary ventilation, and response to care were                        monitored throughout the procedure. The physical status                        of the patient was re-assessed after the procedure.                       After obtaining informed consent, the endoscope was                        passed under direct vision. Throughout the procedure,                        the patient's blood pressure, pulse, and oxygen                        saturations were monitored continuously. The Endoscope                        was introduced  through the mouth, and advanced to the                        second part of duodenum. The upper GI endoscopy was                        accomplished without difficulty. The patient tolerated                        the procedure well. Findings:      The duodenal bulb and second portion of the duodenum were normal.      Mild portal hypertensive gastropathy was found in the entire examined       stomach.      A few 5 to 7 mm sessile polyps with bleeding and stigmata of recent       bleeding were found on the greater curvature of the gastric body. To       prevent bleeding after the polypectomy, one hemostatic clip was       successfully placed  (MR conditional). There was no bleeding at the end       of the procedure.      Large (> 5 mm) varices were found in the mid esophagus and in the distal       esophagus. Extensive scarring from prior banding and banding was not       performed as the banding device was not functional despite multiple       attempts to fix it. Estimated blood loss was minimal. Impression:           - Normal duodenal bulb and second portion of the                        duodenum.                       - Portal hypertensive gastropathy.                       - A few gastric polyps. Clip (MR conditional) was                        placed.                       - Large (> 5 mm) esophageal varices.                       - No specimens collected. Recommendation:       - Discharge patient to home (with escort).                       - Low sodium diet.                       - Continue present medications.                       - Await pathology results.                       - Return to GI clinic as previously scheduled.                       -  Repeat upper endoscopy in 3 months for endoscopic                        band ligation. Procedure Code(s):    --- Professional ---                       [phone], Esophagogastroduodenoscopy, flexible, transoral;                        diagnostic, including collection of specimen(s) by                        brushing or washing, when performed (separate procedure) Diagnosis Code(s):    --- Professional ---                       K76.6, Portal hypertension                       K31.89, Other diseases of stomach and duodenum                       K31.7, Polyp of stomach and duodenum                       I85.00, Esophageal varices without bleeding CPT copyright 2017 American Medical Association. All rights reserved. The codes documented in this report are preliminary and upon coder review may  be revised to meet current compliance requirements. Dr. Ulyess Mort Lin Landsman MD, MD 01/17/2018 9:43:50 AM This report has been signed electronically. Number of Addenda: 0 Note Initiated On: 01/17/2018 8:10 AM      [facility]

## 2018-01-17 NOTE — H&P (Signed)
Cephas Darby, MD [address]  [place]  [place], [place] [address]  Main: [phone]  Fax: [phone] Pager: [phone]  Primary Care Physician:  Margo Common, PA Primary Gastroenterologist:  Dr. Cephas Darby  Pre-Procedure History & Physical: HPI:  Jennifer [name] is a 77 y.o. female is here for an endoscopy.   Past Medical History:  Diagnosis Date  . Allergy   . Arthritis   . Asthma   . Colon polyp   . Fatty liver   . GERD (gastroesophageal reflux disease)   . Hyperlipidemia   . Hypertension   . Motion sickness    boats    Past Surgical History:  Procedure Laterality Date  . ABDOMINAL HYSTERECTOMY  1978  . COLONOSCOPY WITH PROPOFOL N/A 02/01/2015   Procedure: COLONOSCOPY WITH PROPOFOL;  Surgeon: Lucilla Lame, MD;  Location: [place];  Service: Endoscopy;  Laterality: N/A;  Diabetic - oral meds  . COLONOSCOPY WITH PROPOFOL N/A 11/22/2017   Procedure: COLONOSCOPY WITH PROPOFOL;  Surgeon: Lin Landsman, MD;  [facility] ENDOSCOPY;  Service: Gastroenterology;  Laterality: N/A;  . ESOPHAGOGASTRODUODENOSCOPY (EGD) WITH PROPOFOL N/A 07/12/2017   Procedure: ESOPHAGOGASTRODUODENOSCOPY (EGD) WITH PROPOFOL;  Surgeon: Lin Landsman, MD;  Location: [facility] ENDOSCOPY;  Service: Gastroenterology;  Laterality: N/A;  . ESOPHAGOGASTRODUODENOSCOPY (EGD) WITH PROPOFOL N/A 08/16/2017   Procedure: ESOPHAGOGASTRODUODENOSCOPY (EGD) WITH PROPOFOL;  Surgeon: Lin Landsman, MD;  Location: [facility] ENDOSCOPY;  Service: Gastroenterology;  Laterality: N/A;  . ESOPHAGOGASTRODUODENOSCOPY (EGD) WITH PROPOFOL N/A 09/20/2017   Procedure: ESOPHAGOGASTRODUODENOSCOPY (EGD) WITH PROPOFOL;  Surgeon: Lin Landsman, MD;  Location: [facility] ENDOSCOPY;  Service: Gastroenterology;  Laterality: N/A;  . ESOPHAGOGASTRODUODENOSCOPY (EGD) WITH PROPOFOL N/A 11/01/2017   Procedure: ESOPHAGOGASTRODUODENOSCOPY (EGD) WITH PROPOFOL;  Surgeon: Lin Landsman, MD;  Location: [facility]  ENDOSCOPY;  Service: Gastroenterology;  Laterality: N/A;  . POLYPECTOMY  02/01/2015   Procedure: POLYPECTOMY;  Surgeon: Lucilla Lame, MD;  Location: [place];  Service: Endoscopy;;  . TONSILLECTOMY AND ADENOIDECTOMY      Prior to Admission medications   Medication Sig Start Date End Date Taking? Authorizing Provider  albuterol (PROVENTIL HFA;VENTOLIN HFA) 108 (90 Base) MCG/ACT inhaler Inhale 2 puffs into the lungs every 6 (six) hours as needed for wheezing or shortness of breath. 03/30/17  Yes Chrismon, Vickki Muff, PA  furosemide (LASIX) 40 MG tablet TAKE ONE TABLET BY MOUTH EVERY DAY 12/21/17  Yes Jerrik Housholder, Tally Due, MD  IRON PO Take 45 mg by mouth.   Yes [provider]  Lancets Misc. (ACCU-CHEK FASTCLIX LANCET) KIT Test fasting blood sugar daily. 12/05/15  Yes Chrismon, Vickki Muff, PA  magnesium oxide (MAG-OX) 400 MG tablet Take 400 mg by mouth 2 (two) times daily.   Yes [provider]  nadolol (CORGARD) 20 MG tablet TAKE ONE TABLET BY MOUTH EVERY DAY. HOLDIF BLOOD PRESSURE IS LOW (SBP<90) OR FEELING DIZZY. 01/01/18  Yes Morgon Pamer, Tally Due, MD  spironolactone (ALDACTONE) 100 MG tablet TAKE ONE TABLET EVERY DAY 01/01/18  Yes Kalei Meda, Tally Due, MD  Vitamin D, Ergocalciferol, (DRISDOL) 50000 units CAPS capsule TAKE 1 CAPSULE EVERY WEEK (NEED TO SCHEDULE FOLLOW UP LABS TO CHECK VITAMIN D LEVEL) 09/14/17  Yes Chrismon, Vickki Muff, PA  metFORMIN (GLUCOPHAGE) 500 MG tablet TAKE 1 TABLET TWICE DAILY WITH MEALS (NEED MD APPOINTMENT) Patient not taking: Reported on 01/17/2018 09/20/17   Chrismon, Vickki Muff, PA  pantoprazole (PROTONIX) 40 MG tablet TAKE ONE TABLET BY MOUTH TWICE DAILY Patient not taking: Reported on 01/17/2018  12/18/17   Lin Landsman, MD    Allergies as of 11/20/2017 - Review Complete 11/16/2017  Allergen Reaction Noted  . Codeine Other (See Comments) 10/14/2014  . Hydrocodone-acetaminophen Other (See Comments) 10/14/2014  . Nitrofurantoin Other (See  Comments) 11/05/2014  . Nitrofurantoin monohyd macro  10/14/2014  . Penicillins  10/14/2014  . Sulfa antibiotics Other (See Comments) 10/14/2014    Family History  Problem Relation Age of Onset  . Dementia Mother   . Alcohol abuse Father   . Cancer Sister        lung  . Lymphoma Sister   . Lymphoma Sister     Social History   Socioeconomic History  . Marital status: Widowed    Spouse name: Not on file  . Number of children: 2  . Years of education: Not on file  . Highest education level: Not on file  Occupational History  . Occupation: retired  Scientific laboratory technician  . Financial resource strain: Not hard at all  . Food insecurity:    Worry: Never true    Inability: Never true  . Transportation needs:    Medical: No    Non-medical: No  Tobacco Use  . Smoking status: Former Smoker    Packs/day: 0.50    Years: 6.00    Pack years: 3.00    Types: Cigarettes    Last attempt to quit: 05/08/1987    Years since quitting: 30.7  . Smokeless tobacco: Never Used  Substance and Sexual Activity  . Alcohol use: Never    Frequency: Never  . Drug use: Never  . Sexual activity: Not on file  Lifestyle  . Physical activity:    Days per week: Not on file    Minutes per session: Not on file  . Stress: Not at all  Relationships  . Social connections:    Talks on phone: Not on file    Gets together: Not on file    Attends religious service: Not on file    Active member of club or organization: Not on file    Attends meetings of clubs or organizations: Not on file    Relationship status: Not on file  . Intimate partner violence:    Fear of current or ex partner: Not on file    Emotionally abused: Not on file    Physically abused: Not on file    Forced sexual activity: Not on file  Other Topics Concern  . Not on file  Social History Narrative  . Not on file    Review of Systems: See HPI, otherwise negative ROS  Physical Exam: BP 118/67   Pulse 81   Temp (!) 96.8 F (36 C)  (Tympanic)   Resp 20   Ht _0  (1.549 m)   Wt 73.5 kg   SpO2 97%   BMI 30.61 kg/m  General:   Alert,  pleasant and cooperative in NAD Head:  Normocephalic and atraumatic. Neck:  Supple; no masses or thyromegaly. Lungs:  Clear throughout to auscultation.    Heart:  Regular rate and rhythm. Abdomen:  Soft, nontender and nondistended. Normal bowel sounds, without guarding, and without rebound.   Neurologic:  Alert and  oriented x4;  grossly normal neurologically.  Impression/Plan: THAO BAUZA is here for an endoscopy to be performed for variceal surveillance  Risks, benefits, limitations, and alternatives regarding  endoscopy have been reviewed with the patient.  Questions have been answered.  All parties agreeable.   Sherri Sear, MD  01/17/2018,  8:41 AM

## 2018-01-17 NOTE — Anesthesia Postprocedure Evaluation (Signed)
Anesthesia Post Note  Patient: TIEASHA LARSEN  Procedure(s) Performed: ESOPHAGOGASTRODUODENOSCOPY (EGD) WITH PROPOFOL (N/A )  Patient location during evaluation: Endoscopy Anesthesia Type: General Level of consciousness: awake and alert Pain management: pain level controlled Vital Signs Assessment: post-procedure vital signs reviewed and stable Respiratory status: spontaneous breathing, nonlabored ventilation, respiratory function stable and patient connected to nasal cannula oxygen Cardiovascular status: blood pressure returned to baseline and stable Postop Assessment: no apparent nausea or vomiting Anesthetic complications: no     Last Vitals:  Vitals:   01/17/18 1000 01/17/18 1010  BP: (!) 98/51 (!) 102/47  Pulse: (!) 56 (!) 52  Resp: 16 16  Temp:    SpO2: 98% 99%    Last Pain:  Vitals:   01/17/18 1010  TempSrc:   PainSc: 0-No pain                 Martha Clan

## 2018-01-17 NOTE — Anesthesia Preprocedure Evaluation (Signed)
Anesthesia Evaluation  Patient identified by MRN, date of birth, ID band Patient awake    Reviewed: Allergy & Precautions, H&P , NPO status , Patient's Chart, lab work & pertinent test results  History of Anesthesia Complications Negative for: history of anesthetic complications  Airway Mallampati: III  TM Distance: <3 FB Neck ROM: limited    Dental  (+) Chipped, Poor Dentition   Pulmonary neg shortness of breath, asthma , former smoker,           Cardiovascular Exercise Tolerance: Good hypertension, (-) angina(-) Past MI and (-) DOE      Neuro/Psych  Neuromuscular disease negative psych ROS   GI/Hepatic GERD  Controlled and Medicated,(+) Hepatitis -  Endo/Other  diabetes, Type 2  Renal/GU negative Renal ROS  negative genitourinary   Musculoskeletal  (+) Arthritis ,   Abdominal   Peds  Hematology negative hematology ROS (+)   Anesthesia Other Findings Past Medical History: No date: Allergy No date: Arthritis No date: Asthma No date: Colon polyp No date: Fatty liver No date: GERD (gastroesophageal reflux disease) No date: Hyperlipidemia No date: Hypertension No date: Motion sickness     Comment:  boats  Past Surgical History: 1978: ABDOMINAL HYSTERECTOMY 02/01/2015: COLONOSCOPY WITH PROPOFOL; N/A     Comment:  Procedure: COLONOSCOPY WITH PROPOFOL;  Surgeon: Lucilla Lame, MD;  Location: [place];  Service:               Endoscopy;  Laterality: N/A;  Diabetic - oral meds 07/12/2017: ESOPHAGOGASTRODUODENOSCOPY (EGD) WITH PROPOFOL; N/A     Comment:  Procedure: ESOPHAGOGASTRODUODENOSCOPY (EGD) WITH               PROPOFOL;  Surgeon: Lin Landsman, MD;  Location:               [facility] ENDOSCOPY;  Service: Gastroenterology;  Laterality:               N/A; 08/16/2017: ESOPHAGOGASTRODUODENOSCOPY (EGD) WITH PROPOFOL; N/A     Comment:  Procedure: ESOPHAGOGASTRODUODENOSCOPY (EGD) WITH         PROPOFOL;  Surgeon: Lin Landsman, MD;  Location:               [facility] ENDOSCOPY;  Service: Gastroenterology;  Laterality:               N/A; 02/01/2015: POLYPECTOMY     Comment:  Procedure: POLYPECTOMY;  Surgeon: Lucilla Lame, MD;                Location: [place];  Service: Endoscopy;; No date: TONSILLECTOMY AND ADENOIDECTOMY  BMI    Body Mass Index:  28.91 kg/m      Reproductive/Obstetrics negative OB ROS                             Anesthesia Physical  Anesthesia Plan  ASA: III  Anesthesia Plan: General   Post-op Pain Management:    Induction: Intravenous  PONV Risk Score and Plan: Propofol infusion and TIVA  Airway Management Planned: Natural Airway and Nasal Cannula  Additional Equipment:   Intra-op Plan:   Post-operative Plan:   Informed Consent: I have reviewed the patients History and Physical, chart, labs and discussed the procedure including the risks, benefits and alternatives for the proposed anesthesia with the patient or authorized representative who has indicated his/her understanding and acceptance.  Dental Advisory Given  Plan Discussed with: Anesthesiologist, CRNA and Surgeon  Anesthesia Plan Comments: (Patient consented for risks of anesthesia including but not limited to:  - adverse reactions to medications - risk of intubation if required - damage to teeth, lips or other oral mucosa - sore throat or hoarseness - Damage to heart, brain, lungs or loss of life  Patient voiced understanding.)        Anesthesia Quick Evaluation

## 2018-01-17 NOTE — Transfer of Care (Signed)
Immediate Anesthesia Transfer of Care Note  Patient: Jennifer [name]  Procedure(s) Performed: ESOPHAGOGASTRODUODENOSCOPY (EGD) WITH PROPOFOL (N/A )  Patient Location: PACU and Endoscopy Unit  Anesthesia Type:General  Level of Consciousness: drowsy  Airway & Oxygen Therapy: Patient Spontanous Breathing and Patient connected to nasal cannula oxygen  Post-op Assessment: Report given to RN and Post -op Vital signs reviewed and stable  Post vital signs: Reviewed and stable  Last Vitals:  Vitals Value Taken Time  BP    Temp    Pulse    Resp    SpO2      Last Pain:  Vitals:   01/17/18 0809  TempSrc: Tympanic  PainSc: 0-No pain         Complications: No apparent anesthesia complications

## 2018-01-17 NOTE — Anesthesia Post-op Follow-up Note (Signed)
Anesthesia QCDR form completed.        

## 2018-01-18 LAB — SURGICAL PATHOLOGY

## 2018-01-21 ENCOUNTER — Encounter: Payer: Self-pay | Admitting: Gastroenterology

## 2018-01-26 ENCOUNTER — Other Ambulatory Visit: Payer: Self-pay | Admitting: Family Medicine

## 2018-01-26 DIAGNOSIS — E559 Vitamin D deficiency, unspecified: Secondary | ICD-10-CM

## 2018-01-30 ENCOUNTER — Observation Stay
Admission: EM | Admit: 2018-01-30 | Discharge: 2018-02-01 | Disposition: A | Discharge status: Skilled Nursing Facility | Payer: Medicare HMO | Attending: Internal Medicine | Admitting: Internal Medicine

## 2018-01-30 ENCOUNTER — Emergency Department: Payer: Medicare HMO

## 2018-01-30 ENCOUNTER — Other Ambulatory Visit: Payer: Self-pay

## 2018-01-30 DIAGNOSIS — Z888 Allergy status to other drugs, medicaments and biological substances status: Secondary | ICD-10-CM | POA: Insufficient documentation

## 2018-01-30 DIAGNOSIS — Z86010 Personal history of colonic polyps: Secondary | ICD-10-CM | POA: Insufficient documentation

## 2018-01-30 DIAGNOSIS — I8510 Secondary esophageal varices without bleeding: Secondary | ICD-10-CM | POA: Insufficient documentation

## 2018-01-30 DIAGNOSIS — Z882 Allergy status to sulfonamides status: Secondary | ICD-10-CM | POA: Insufficient documentation

## 2018-01-30 DIAGNOSIS — Z87891 Personal history of nicotine dependence: Secondary | ICD-10-CM | POA: Insufficient documentation

## 2018-01-30 DIAGNOSIS — E785 Hyperlipidemia, unspecified: Secondary | ICD-10-CM | POA: Insufficient documentation

## 2018-01-30 DIAGNOSIS — J45909 Unspecified asthma, uncomplicated: Secondary | ICD-10-CM | POA: Insufficient documentation

## 2018-01-30 DIAGNOSIS — E559 Vitamin D deficiency, unspecified: Secondary | ICD-10-CM | POA: Insufficient documentation

## 2018-01-30 DIAGNOSIS — Z90710 Acquired absence of both cervix and uterus: Secondary | ICD-10-CM | POA: Diagnosis not present

## 2018-01-30 DIAGNOSIS — S32591A Other specified fracture of right pubis, initial encounter for closed fracture: Principal | ICD-10-CM | POA: Insufficient documentation

## 2018-01-30 DIAGNOSIS — D123 Benign neoplasm of transverse colon: Secondary | ICD-10-CM | POA: Diagnosis not present

## 2018-01-30 DIAGNOSIS — Z820 Family history of epilepsy and other diseases of the nervous system: Secondary | ICD-10-CM | POA: Insufficient documentation

## 2018-01-30 DIAGNOSIS — Y939 Activity, unspecified: Secondary | ICD-10-CM | POA: Insufficient documentation

## 2018-01-30 DIAGNOSIS — K219 Gastro-esophageal reflux disease without esophagitis: Secondary | ICD-10-CM | POA: Diagnosis not present

## 2018-01-30 DIAGNOSIS — K7581 Nonalcoholic steatohepatitis (NASH): Secondary | ICD-10-CM | POA: Diagnosis not present

## 2018-01-30 DIAGNOSIS — Z807 Family history of other malignant neoplasms of lymphoid, hematopoietic and related tissues: Secondary | ICD-10-CM | POA: Insufficient documentation

## 2018-01-30 DIAGNOSIS — D122 Benign neoplasm of ascending colon: Secondary | ICD-10-CM | POA: Diagnosis not present

## 2018-01-30 DIAGNOSIS — Z7951 Long term (current) use of inhaled steroids: Secondary | ICD-10-CM | POA: Insufficient documentation

## 2018-01-30 DIAGNOSIS — E669 Obesity, unspecified: Secondary | ICD-10-CM | POA: Insufficient documentation

## 2018-01-30 DIAGNOSIS — E119 Type 2 diabetes mellitus without complications: Secondary | ICD-10-CM | POA: Diagnosis not present

## 2018-01-30 DIAGNOSIS — Z885 Allergy status to narcotic agent status: Secondary | ICD-10-CM | POA: Insufficient documentation

## 2018-01-30 DIAGNOSIS — Z880 Allergy status to penicillin: Secondary | ICD-10-CM | POA: Insufficient documentation

## 2018-01-30 DIAGNOSIS — S32511A Fracture of superior rim of right pubis, initial encounter for closed fracture: Secondary | ICD-10-CM | POA: Diagnosis not present

## 2018-01-30 DIAGNOSIS — G5601 Carpal tunnel syndrome, right upper limb: Secondary | ICD-10-CM | POA: Insufficient documentation

## 2018-01-30 DIAGNOSIS — I10 Essential (primary) hypertension: Secondary | ICD-10-CM | POA: Insufficient documentation

## 2018-01-30 DIAGNOSIS — Z881 Allergy status to other antibiotic agents status: Secondary | ICD-10-CM | POA: Insufficient documentation

## 2018-01-30 DIAGNOSIS — Z811 Family history of alcohol abuse and dependence: Secondary | ICD-10-CM | POA: Insufficient documentation

## 2018-01-30 DIAGNOSIS — Z7984 Long term (current) use of oral hypoglycemic drugs: Secondary | ICD-10-CM | POA: Insufficient documentation

## 2018-01-30 DIAGNOSIS — M545 Low back pain: Secondary | ICD-10-CM | POA: Diagnosis not present

## 2018-01-30 DIAGNOSIS — Z6832 Body mass index (BMI) 32.0-32.9, adult: Secondary | ICD-10-CM | POA: Insufficient documentation

## 2018-01-30 DIAGNOSIS — W19XXXA Unspecified fall, initial encounter: Secondary | ICD-10-CM | POA: Diagnosis not present

## 2018-01-30 DIAGNOSIS — D696 Thrombocytopenia, unspecified: Secondary | ICD-10-CM | POA: Insufficient documentation

## 2018-01-30 DIAGNOSIS — S299XXA Unspecified injury of thorax, initial encounter: Secondary | ICD-10-CM | POA: Diagnosis not present

## 2018-01-30 DIAGNOSIS — S32501A Unspecified fracture of right pubis, initial encounter for closed fracture: Secondary | ICD-10-CM | POA: Diagnosis not present

## 2018-01-30 DIAGNOSIS — S329XXA Fracture of unspecified parts of lumbosacral spine and pelvis, initial encounter for closed fracture: Secondary | ICD-10-CM | POA: Diagnosis present

## 2018-01-30 DIAGNOSIS — M25551 Pain in right hip: Secondary | ICD-10-CM | POA: Diagnosis not present

## 2018-01-30 DIAGNOSIS — R079 Chest pain, unspecified: Secondary | ICD-10-CM | POA: Diagnosis not present

## 2018-01-30 DIAGNOSIS — M1990 Unspecified osteoarthritis, unspecified site: Secondary | ICD-10-CM | POA: Insufficient documentation

## 2018-01-30 DIAGNOSIS — M1611 Unilateral primary osteoarthritis, right hip: Secondary | ICD-10-CM | POA: Insufficient documentation

## 2018-01-30 DIAGNOSIS — Z801 Family history of malignant neoplasm of trachea, bronchus and lung: Secondary | ICD-10-CM | POA: Insufficient documentation

## 2018-01-30 DIAGNOSIS — K317 Polyp of stomach and duodenum: Secondary | ICD-10-CM | POA: Insufficient documentation

## 2018-01-30 DIAGNOSIS — Z79899 Other long term (current) drug therapy: Secondary | ICD-10-CM | POA: Insufficient documentation

## 2018-01-30 MED ORDER — FENTANYL CITRATE (PF) 100 MCG/2ML IJ SOLN
50.0000 ug | Freq: Once | INTRAMUSCULAR | Status: AC
  Administered 2018-01-30: 50 ug via INTRAVENOUS
  Filled 2018-01-30: qty 2

## 2018-01-30 NOTE — ED Notes (Signed)
Pt back from X-ray.  

## 2018-01-30 NOTE — ED Notes (Signed)
Patient transported to X-ray 

## 2018-01-30 NOTE — ED Triage Notes (Signed)
Pt comes via [facility] from home with c/o mechanical fall. Pt is A&OX4. VSS. Pt c/o right hip pain and right abrasion noted to arm. Pt denies LOC.

## 2018-01-30 NOTE — ED Notes (Signed)
Report given to [facility]

## 2018-01-30 NOTE — ED Provider Notes (Signed)
[facility] Emergency Department Provider Note  Time seen: 9:22 PM  I have reviewed the triage vital signs and the nursing notes.   HISTORY  Chief Complaint Fall    HPI Jennifer [name] is a 77 y.o. female with a past medical history of arthritis, asthma, gastric reflux, hypertension, hyperlipidemia who presents to the emergency department after a fall.  According to the patient she was getting out of a truck when she stepped off the curb falling onto her right hip in the grass.  Denies hitting her head.  Denies LOC.  Patient states immediate pain to the right hip unable to stand up or bear weight.  States minimal pain currently but moderate pain with any attempted movement.   Past Medical History:  Diagnosis Date  . Allergy   . Arthritis   . Asthma   . Colon polyp   . Fatty liver   . GERD (gastroesophageal reflux disease)   . Hyperlipidemia   . Hypertension   . Motion sickness    boats    Patient Active Problem List   Diagnosis Date Noted  . Multiple gastric polyps   . Hx of colonic polyps   . Secondary esophageal varices without bleeding ([place])   . Acute upper GI bleed 07/12/2017  . Liver cirrhosis secondary to NASH ([place])   . Acute right-sided low back pain 01/31/2017  . Carpal tunnel syndrome on right 09/28/2016  . Obesity (BMI 30.0-34.9) 09/20/2016  . Upper respiratory infection 03/17/2016  . Thrombocytopenia ([place]) 12/29/2015  . Primary osteoarthritis of left knee 09/30/2015  . Special screening for malignant neoplasms, colon   . Benign neoplasm of ascending colon   . Benign neoplasm of transverse colon   . Benign neoplasm of sigmoid colon   . Arthritis 10/21/2014  . Allergic rhinitis 10/14/2014  . Airway hyperreactivity 10/14/2014  . Back ache 10/14/2014  . Bell palsy 10/14/2014  . Controlled diabetes mellitus type II without complication ([place]) 96/75/9163  . Elevation of level of transaminase or lactic acid dehydrogenase (LDH) 10/14/2014   . Calculus of gallbladder 10/14/2014  . HLD (hyperlipidemia) 10/14/2014  . BP (high blood pressure) 10/14/2014  . Gonalgia 10/14/2014  . Benign neoplasm of soft tissues 10/14/2014  . NASH (nonalcoholic steatohepatitis) 10/14/2014  . Adiposity 10/14/2014  . Esophagitis, reflux 10/14/2014  . Enlargement of spleen 10/14/2014  . Change in blood platelet count 10/14/2014  . Avitaminosis D 10/14/2014    Past Surgical History:  Procedure Laterality Date  . ABDOMINAL HYSTERECTOMY  1978  . COLONOSCOPY WITH PROPOFOL N/A 02/01/2015   Procedure: COLONOSCOPY WITH PROPOFOL;  Surgeon: Lucilla Lame, MD;  Location: [place];  Service: Endoscopy;  Laterality: N/A;  Diabetic - oral meds  . COLONOSCOPY WITH PROPOFOL N/A 11/22/2017   Procedure: COLONOSCOPY WITH PROPOFOL;  Surgeon: Lin Landsman, MD;  Location: [facility] ENDOSCOPY;  Service: Gastroenterology;  Laterality: N/A;  . ESOPHAGOGASTRODUODENOSCOPY (EGD) WITH PROPOFOL N/A 07/12/2017   Procedure: ESOPHAGOGASTRODUODENOSCOPY (EGD) WITH PROPOFOL;  Surgeon: Lin Landsman, MD;  Location[facility] ENDOSCOPY;  Service: Gastroenterology;  Laterality: N/A;  . ESOPHAGOGASTRODUODENOSCOPY (EGD) WITH PROPOFOL N/A 08/16/2017   Procedure: ESOPHAGOGASTRODUODENOSCOPY (EGD) WITH PROPOFOL;  Surgeon: Lin Landsman, MD;  Location: [facility] ENDOSCOPY;  Service: Gastroenterology;  Laterality: N/A;  . ESOPHAGOGASTRODUODENOSCOPY (EGD) WITH PROPOFOL N/A 09/20/2017   Procedure: ESOPHAGOGASTRODUODENOSCOPY (EGD) WITH PROPOFOL;  Surgeon: Lin Landsman, MD;  [facility] ENDOSCOPY;  Service: Gastroenterology;  Laterality: N/A;  . ESOPHAGOGASTRODUODENOSCOPY (EGD) WITH PROPOFOL N/A 11/01/2017   Procedure:  ESOPHAGOGASTRODUODENOSCOPY (EGD) WITH PROPOFOL;  Surgeon: Lin Landsman, MD;  Location[facility];  Service: Gastroenterology;  Laterality: N/A;  . ESOPHAGOGASTRODUODENOSCOPY (EGD) WITH PROPOFOL N/A 01/17/2018   Procedure: ESOPHAGOGASTRODUODENOSCOPY (EGD)  WITH PROPOFOL;  Surgeon: Lin Landsman, MD;  Location: [facility] ENDOSCOPY;  Service: Gastroenterology;  Laterality: N/A;  . POLYPECTOMY  02/01/2015   Procedure: POLYPECTOMY;  Surgeon: Lucilla Lame, MD;  Location: [place];  Service: Endoscopy;;  . TONSILLECTOMY AND ADENOIDECTOMY      Prior to Admission medications   Medication Sig Start Date End Date Taking? Authorizing Provider  albuterol (PROVENTIL HFA;VENTOLIN HFA) 108 (90 Base) MCG/ACT inhaler Inhale 2 puffs into the lungs every 6 (six) hours as needed for wheezing or shortness of breath. 03/30/17   Chrismon, Vickki Muff, PA  furosemide (LASIX) 40 MG tablet Take 0.5 tablets (20 mg total) by mouth daily. 01/17/18   Lin Landsman, MD  IRON PO Take 45 mg by mouth.    [provider]  Lancets Misc. (ACCU-CHEK FASTCLIX LANCET) KIT Test fasting blood sugar daily. 12/05/15   Chrismon, Vickki Muff, PA  magnesium oxide (MAG-OX) 400 MG tablet Take 400 mg by mouth 2 (two) times daily.    [provider]  metFORMIN (GLUCOPHAGE) 500 MG tablet TAKE 1 TABLET TWICE DAILY WITH MEALS (NEED MD APPOINTMENT) Patient not taking: Reported on 01/17/2018 09/20/17   Chrismon, Simona Huh E, PA  nadolol (CORGARD) 20 MG tablet TAKE ONE TABLET BY MOUTH EVERY DAY. HOLDIF BLOOD PRESSURE IS LOW (SBP<90) OR FEELING DIZZY. 01/01/18   Lin Landsman, MD  pantoprazole (PROTONIX) 40 MG tablet TAKE ONE TABLET BY MOUTH TWICE DAILY Patient not taking: Reported on 01/17/2018 12/18/17   Lin Landsman, MD  spironolactone (ALDACTONE) 100 MG tablet TAKE ONE TABLET EVERY DAY 01/01/18   Vanga, Tally Due, MD  Vitamin D, Ergocalciferol, (DRISDOL) 50000 units CAPS capsule TAKE 1 CAPSULE EVERY WEEK (NEED TO SCHEDULE FOLLOW UP LABS TO CHECK VITAMIN D LEVEL) 01/27/18   Chrismon, Vickki Muff, PA    Allergies  Allergen Reactions  . Codeine Other (See Comments)    Pt denies  . Hydrocodone-Acetaminophen Other (See Comments)    Pt denies  . Nitrofurantoin Other (See  Comments)  . Nitrofurantoin Monohyd Macro     "Sugar got high"  . Penicillins     Has patient had a PCN reaction causing immediate rash, facial/tongue/throat swelling, SOB or lightheadedness with hypotension: Unknown Has patient had a PCN reaction causing severe rash involving mucus membranes or skin necrosis: Unknown Has patient had a PCN reaction that required hospitalization: Unknown Has patient had a PCN reaction occurring within the last 10 years: Unknown If all of the above answers are "NO", then may proceed with Cephalosporin use.  . Sulfa Antibiotics Other (See Comments)    Family History  Problem Relation Age of Onset  . Dementia Mother   . Alcohol abuse Father   . Cancer Sister        lung  . Lymphoma Sister   . Lymphoma Sister     Social History Social History   Tobacco Use  . Smoking status: Former Smoker    Packs/day: 0.50    Years: 6.00    Pack years: 3.00    Types: Cigarettes    Last attempt to quit: 05/08/1987    Years since quitting: 30.7  . Smokeless tobacco: Never Used  Substance Use Topics  . Alcohol use: Never    Frequency: Never  . Drug use: Never  Review of Systems Constitutional: Negative for fever. Cardiovascular: Negative for chest pain. Respiratory: Negative for shortness of breath. Gastrointestinal: Negative for abdominal pain, vomiting  Genitourinary: Negative for urinary compaints Musculoskeletal: Right hip pain Skin: Negative for skin complaints  Neurological: Negative for headache All other ROS negative  ____________________________________________   PHYSICAL EXAM:  VITAL SIGNS: ED Triage Vitals  Enc Vitals Group     BP 01/30/18 2039 120/71     Pulse Rate 01/30/18 2038 62     Resp 01/30/18 2038 18     Temp 01/30/18 2038 98 F (36.7 C)     Temp Source 01/30/18 2038 Oral     SpO2 01/30/18 2038 99 %     Weight 01/30/18 2039 162 lb (73.5 kg)     Height 01/30/18 2039 5' 1"  (1.549 m)     Head Circumference --      Peak  Flow --      Pain Score 01/30/18 2039 10     Pain Loc --      Pain Edu? --      Excl. in [place]? --     Constitutional: Alert and oriented. Well appearing and in no distress. Eyes: Normal exam ENT   Head: Normocephalic and atraumatic.   Mouth/Throat: Mucous membranes are moist. Cardiovascular: Normal rate, regular rhythm. Respiratory: Normal respiratory effort without tachypnea nor retractions. Breath sounds are clear  Gastrointestinal: Soft and nontender. No distention.   Musculoskeletal: Mild right hip tenderness palpation moderate pain with any attempted range of motion.  Neurovascular intact distally with 2+ DP pulse, sensation intact and equal. Neurologic:  Normal speech and language. No gross focal neurologic deficits Skin:  Skin is warm, dry and intact.  Psychiatric: Mood and affect are normal.   ____________________________________________   RADIOLOGY  X-ray of the pelvis shows nondisplaced superior and inferior right pubic rami fractures. X-ray of the chest is negative  ____________________________________________   INITIAL IMPRESSION / ASSESSMENT AND PLAN / ED COURSE  Pertinent labs & imaging results that were available during my care of the patient were reviewed by me and considered in my medical decision making (see chart for details).  Patient presents emergency department after a fall with right hip pain.  Exam consistent with likely fracture.  We will obtain x-rays to confirm, basic labs and EKG for preop purposes.  Patient agreeable to plan of care.  Hip x-ray shows superior and inferior pubic rami fractures on the right side.  We will place IV check labs and admit to the hospital service as the patient cannot bear weight due to pain.  Patient agreeable to plan of care.  We will dose fentanyl for pain control.  ____________________________________________   FINAL CLINICAL IMPRESSION(S) / ED DIAGNOSES  Pelvis fracture    Harvest Dark,  MD 01/30/18 2320

## 2018-01-30 NOTE — ED Notes (Signed)
Per MD hold off on EKG and blood work until [employer]

## 2018-01-31 ENCOUNTER — Encounter
Admission: RE | Admit: 2018-01-31 | Discharge: 2018-01-31 | Disposition: A | Discharge status: Home / Self Care | Payer: Medicare HMO | Source: Ambulatory Visit | Attending: Internal Medicine | Admitting: Internal Medicine

## 2018-01-31 DIAGNOSIS — D696 Thrombocytopenia, unspecified: Secondary | ICD-10-CM | POA: Diagnosis not present

## 2018-01-31 DIAGNOSIS — K219 Gastro-esophageal reflux disease without esophagitis: Secondary | ICD-10-CM | POA: Diagnosis not present

## 2018-01-31 DIAGNOSIS — S329XXA Fracture of unspecified parts of lumbosacral spine and pelvis, initial encounter for closed fracture: Secondary | ICD-10-CM | POA: Diagnosis present

## 2018-01-31 DIAGNOSIS — I10 Essential (primary) hypertension: Secondary | ICD-10-CM | POA: Diagnosis not present

## 2018-01-31 HISTORY — DX: Fracture of unspecified parts of lumbosacral spine and pelvis, initial encounter for closed fracture: S32.9XXA

## 2018-01-31 LAB — CBC
HEMATOCRIT: 31.6 % — AB (ref 35.0–47.0)
HEMATOCRIT: 34.6 % — AB (ref 35.0–47.0)
HEMOGLOBIN: 11.5 g/dL — AB (ref 12.0–16.0)
Hemoglobin: 12.5 g/dL (ref 12.0–16.0)
MCH: 32.6 pg (ref 26.0–34.0)
MCH: 32.7 pg (ref 26.0–34.0)
MCHC: 36.4 g/dL — ABNORMAL HIGH (ref 32.0–36.0)
MCHC: 36.1 g/dL — AB (ref 32.0–36.0)
MCV: 89.5 fL (ref 80.0–100.0)
MCV: 90.4 fL (ref 80.0–100.0)
Platelets: 46 10*3/uL — ABNORMAL LOW (ref 150–440)
Platelets: 59 10*3/uL — ABNORMAL LOW (ref 150–440)
RBC: 3.53 MIL/uL — AB (ref 3.80–5.20)
RBC: 3.82 MIL/uL (ref 3.80–5.20)
RDW: 13.3 % (ref 11.5–14.5)
RDW: 14 % (ref 11.5–14.5)
WBC: 2.5 10*3/uL — AB (ref 3.6–11.0)
WBC: 4 10*3/uL (ref 3.6–11.0)

## 2018-01-31 LAB — BASIC METABOLIC PANEL
ANION GAP: 7 (ref 5–15)
ANION GAP: 8 (ref 5–15)
BUN: 13 mg/dL (ref 8–23)
BUN: 14 mg/dL (ref 8–23)
CALCIUM: 9.5 mg/dL (ref 8.9–10.3)
CHLORIDE: 96 mmol/L — AB (ref 98–111)
CO2: 30 mmol/L (ref 22–32)
CO2: 30 mmol/L (ref 22–32)
Calcium: 9.3 mg/dL (ref 8.9–10.3)
Chloride: 93 mmol/L — ABNORMAL LOW (ref 98–111)
Creatinine, Ser: 0.83 mg/dL (ref 0.44–1.00)
Creatinine, Ser: 0.86 mg/dL (ref 0.44–1.00)
GFR calc Af Amer: >60 mL/min (ref >60)
GFR calc non Af Amer: >60 mL/min (ref >60)
GLUCOSE: 118 mg/dL — AB (ref 70–99)
Glucose, Bld: 112 mg/dL — ABNORMAL HIGH (ref 70–99)
POTASSIUM: 3.9 mmol/L (ref 3.5–5.1)
Potassium: 4.5 mmol/L (ref 3.5–5.1)
SODIUM: 133 mmol/L — AB (ref 135–145)
Sodium: 131 mmol/L — ABNORMAL LOW (ref 135–145)

## 2018-01-31 LAB — GLUCOSE, CAPILLARY: Glucose-Capillary: 91 mg/dL (ref 70–99)

## 2018-01-31 MED ORDER — SODIUM CHLORIDE 0.9 % IV SOLN
Freq: Once | INTRAVENOUS | Status: AC
  Administered 2018-01-31: 02:00:00 via INTRAVENOUS

## 2018-01-31 MED ORDER — HEPARIN SODIUM (PORCINE) 5000 UNIT/ML IJ SOLN: 5000.0000 [IU] | Freq: Three times a day (TID) | INTRAMUSCULAR | Status: DC

## 2018-01-31 MED ORDER — ALBUTEROL SULFATE (2.5 MG/3ML) 0.083% IN NEBU: 2.5000 mg | INHALATION_SOLUTION | Freq: Four times a day (QID) | RESPIRATORY_TRACT | Status: DC | PRN

## 2018-01-31 MED ORDER — ONDANSETRON HCL 4 MG/2ML IJ SOLN: 4.0000 mg | Freq: Four times a day (QID) | INTRAMUSCULAR | Status: DC | PRN

## 2018-01-31 MED ORDER — FERROUS SULFATE 75 (15 FE) MG/ML PO SOLN: 45.0000 mg | ORAL | Status: DC

## 2018-01-31 MED ORDER — MAGNESIUM OXIDE 400 (241.3 MG) MG PO TABS
400.0000 mg | ORAL_TABLET | Freq: Two times a day (BID) | ORAL | Status: DC
  Administered 2018-01-31 – 2018-02-01 (×3): 400 mg via ORAL
  Filled 2018-01-31 (×3): qty 1

## 2018-01-31 MED ORDER — ONDANSETRON HCL 4 MG PO TABS: 4.0000 mg | ORAL_TABLET | Freq: Four times a day (QID) | ORAL | Status: DC | PRN

## 2018-01-31 MED ORDER — NADOLOL 20 MG PO TABS
20.0000 mg | ORAL_TABLET | Freq: Every day | ORAL | Status: DC
  Filled 2018-01-31 (×2): qty 1

## 2018-01-31 MED ORDER — SPIRONOLACTONE 25 MG PO TABS: 100.0000 mg | ORAL_TABLET | Freq: Every day | ORAL | Status: DC

## 2018-01-31 MED ORDER — FUROSEMIDE 20 MG PO TABS: 20.0000 mg | ORAL_TABLET | Freq: Every day | ORAL | Status: DC

## 2018-01-31 MED ORDER — HYDROMORPHONE HCL 1 MG/ML IJ SOLN: 0.5000 mg | INTRAMUSCULAR | Status: DC | PRN

## 2018-01-31 MED ORDER — TRAZODONE HCL 50 MG PO TABS
25.0000 mg | ORAL_TABLET | Freq: Every evening | ORAL | Status: DC | PRN
  Administered 2018-01-31: 25 mg via ORAL
  Filled 2018-01-31: qty 1

## 2018-01-31 MED ORDER — DOCUSATE SODIUM 100 MG PO CAPS
100.0000 mg | ORAL_CAPSULE | Freq: Two times a day (BID) | ORAL | Status: DC
  Administered 2018-01-31: 100 mg via ORAL
  Filled 2018-01-31 (×3): qty 1

## 2018-01-31 MED ORDER — BISACODYL 5 MG PO TBEC: 5.0000 mg | DELAYED_RELEASE_TABLET | Freq: Every day | ORAL | Status: DC | PRN

## 2018-01-31 NOTE — Progress Notes (Signed)
OT Cancellation Note  Patient Details Name: Jennifer [name] MRN: [id number] DOB: [birth year]   Cancelled Treatment:    Reason Eval/Treat Not Completed: Patient declined, no reason specified. Order received, chart reviewed. Pt with RN upon attempt. OT assisted with rolling pt onto bed pan. Pt requesting privacy while on bed pan and politely declining OT at this time. Will re-attempt OT evaluation at later date/time as pt is available, medically appropriate, and as schedule permits.   [name], MPH, MS, OTR/L ascom [phone] 01/31/18, 2:04 PM

## 2018-01-31 NOTE — Clinical Social Work Placement (Signed)
   CLINICAL SOCIAL WORK PLACEMENT  NOTE  Date:  01/31/2018  Patient Details  Name: Jennifer [name] MRN: [id number] Date of Birth: July 13, 1940  Clinical Social Work is seeking post-discharge placement for this patient at the [place] level of care (*CSW will initial, date and re-position this form in  chart as items are completed):  Yes   Patient/family provided with [place] Work Department's list of facilities offering this level of care within the geographic area requested by the patient (or if unable, by the patient's family).  Yes   Patient/family informed of their freedom to choose among providers that offer the needed level of care, that participate in Medicare, Medicaid or managed care program needed by the patient, have an available bed and are willing to accept the patient.  Yes   Patient/family informed of [place]'s ownership interest in [facility] and [facility], as well as of the fact that they are under no obligation to receive care at these facilities.  PASRR submitted to EDS on 01/31/18     PASRR number received on 01/31/18     Existing PASRR number confirmed on       FL2 transmitted to all facilities in geographic area requested by pt/family on 01/31/18     FL2 transmitted to all facilities within larger geographic area on       Patient informed that his/her managed care company has contracts with or will negotiate with certain facilities, including the following:        Yes   Patient/family informed of bed offers received.  Patient chooses bed at [facility] )     Physician recommends and patient chooses bed at      Patient to be transferred to   on  .  Patient to be transferred to facility by       Patient family notified on   of transfer.  Name of family member notified:        PHYSICIAN       Additional Comment:    _______________________________________________ Raiya Stainback, Veronia Beets, LCSW 01/31/2018,  5:08 PM

## 2018-01-31 NOTE — Progress Notes (Signed)
[place] [facility] [place] NAME: Jennifer [name]    MR#:  [id number]  DATE OF BIRTH:  1940-06-13  SUBJECTIVE:  CHIEF COMPLAINT: Patient's pain is manageable denies any bruising or active bleeding  REVIEW OF SYSTEMS:  CONSTITUTIONAL: No fever, fatigue or weakness.  EYES: No blurred or double vision.  EARS, NOSE, AND THROAT: No tinnitus or ear pain.  RESPIRATORY: No cough, shortness of breath, wheezing or hemoptysis.  CARDIOVASCULAR: No chest pain, orthopnea, edema.  GASTROINTESTINAL: No nausea, vomiting, diarrhea or abdominal pain.  GENITOURINARY: No dysuria, hematuria.  ENDOCRINE: No polyuria, nocturia,  HEMATOLOGY: No anemia, easy bruising or bleeding SKIN: No rash or lesion. MUSCULOSKELETAL: Right hip pain NEUROLOGIC: No tingling, numbness, weakness.  PSYCHIATRY: No anxiety or depression.   DRUG ALLERGIES:   Allergies  Allergen Reactions  . Codeine Other (See Comments)    Pt denies  . Hydrocodone-Acetaminophen Other (See Comments)    Pt denies  . Nitrofurantoin Other (See Comments)  . Nitrofurantoin Monohyd Macro     "Sugar got high"  . Penicillins     Has patient had a PCN reaction causing immediate rash, facial/tongue/throat swelling, SOB or lightheadedness with hypotension: Unknown Has patient had a PCN reaction causing severe rash involving mucus membranes or skin necrosis: Unknown Has patient had a PCN reaction that required hospitalization: Unknown Has patient had a PCN reaction occurring within the last 10 years: Unknown If all of the above answers are "NO", then may proceed with Cephalosporin use.  . Sulfa Antibiotics Other (See Comments)    VITALS:  Blood pressure (!) 110/53, pulse (!) 57, temperature 98.1 F (36.7 C), temperature source Oral, resp. rate 16, height 5' 1"  (1.549 m), weight 74.8 kg, SpO2 99 %.  PHYSICAL EXAMINATION:  GENERAL:  77 y.o.-year-old patient lying in the bed with no acute distress.  EYES:  Pupils equal, round, reactive to light and accommodation. No scleral icterus. Extraocular muscles intact.  HEENT: Head atraumatic, normocephalic. Oropharynx and nasopharynx clear.  NECK:  Supple, no jugular venous distention. No thyroid enlargement, no tenderness.  LUNGS: Normal breath sounds bilaterally, no wheezing, rales,rhonchi or crepitation. No use of accessory muscles of respiration.  CARDIOVASCULAR: S1, S2 normal. No murmurs, rubs, or gallops.  ABDOMEN: Soft, nontender, nondistended. Bowel sounds present.  EXTREMITIES: Left hip area is tender no pedal edema, cyanosis, or clubbing.  NEUROLOGIC: Right hip tender.  Sensation intact. Gait not checked.  PSYCHIATRIC: The patient is alert and oriented x 3.  SKIN: No obvious rash, lesion, or ulcer.    LABORATORY PANEL:   CBC Recent Labs  Lab 01/31/18 0650  WBC 2.5*  HGB 11.5*  HCT 31.6*  PLT 46*   ------------------------------------------------------------------------------------------------------------------  Chemistries  Recent Labs  Lab 01/31/18 0650  NA 133*  K 3.9  CL 96*  CO2 30  GLUCOSE 112*  BUN 13  CREATININE 0.83  CALCIUM 9.3   ------------------------------------------------------------------------------------------------------------------  Cardiac Enzymes No results for input(s): TROPONINI in the last 168 hours. ------------------------------------------------------------------------------------------------------------------  RADIOLOGY:  Dg Chest 1 View  Result Date: 01/30/2018 CLINICAL DATA:  77 year old female with fall and pain. EXAM: CHEST  1 VIEW COMPARISON:  Chest radiograph dated 07/12/2017 FINDINGS: The lungs are clear. There is no pleural effusion or pneumothorax. The cardiac silhouette is within normal limits. Probable small right hilar granuloma. A 17 mm apparent nodular density in the right suprahilar region may be artifactual or represent a mildly enlarged lymph node or partially calcified  granuloma. Clinical correlation and attention  follow-up imaging recommended. Chest CT may provide better evaluation if clinically indicated. Partially visualized heterogeneous area in the proximal left humerus not well evaluated. Further evaluation with dedicated radiograph on a nonemergent basis recommended. IMPRESSION: No acute cardiopulmonary process. Electronically Signed   By: Anner Crete M.D.   On: 01/30/2018 22:01   Dg Hip Unilat W Or Wo Pelvis 2-3 Views Right  Result Date: 01/30/2018 CLINICAL DATA:  Fall with right hip pain EXAM: DG HIP (WITH OR WITHOUT PELVIS) 2-3V RIGHT COMPARISON:  08/09/2017 CT abdomen/pelvis FINDINGS: Slight cortical discontinuity in the superior right pubic ramus compatible with nondisplaced fracture. Minimal cortical irregularity in the inferior pubic ramus, new, compatible with nondisplaced fracture. No additional fractures. No right hip dislocation. No suspicious focal osseous lesion. Mild osteoarthritis in the weight-bearing portion of the right hip joint. Degenerative changes in the visualized lower lumbar spine. IMPRESSION: Nondisplaced superior and inferior right pubic ramus fractures. Electronically Signed   By: Ilona Sorrel M.D.   On: 01/30/2018 22:00    EKG:   Orders placed or performed during the hospital encounter of 07/12/17  . EKG 12-Lead  . EKG 12-Lead  . EKG    ASSESSMENT AND PLAN:    .  Acute pelvic fracture, status post mechanical fall.  This does not require surgery, per orthopedics.   Continue pain control.   PT recommending skilled nursing facility. 2.  Chronic thrombocytopenia.  Platelet count is stable at 59-46,000 No active bleeding or bruising noticed  Will hold any anticoagulants and continue to monitor platelet count closely.  Continue to follow-up with hematology as outpatient. 3.  GERD PPI 4.  Hypertension-renew home medication nadolol and Aldactone 5 hyperlipidemia-not on any medications PCP to consider getting outpatient  fasting lipid panel     All the records are reviewed and case discussed with Care Management/Social Workerr. Management plans discussed with the patient, family and they are in agreement.  CODE STATUS: fc   TOTAL TIME TAKING CARE OF THIS PATIENT: 33 minutes.   POSSIBLE D/C IN 1-2  DAYS, DEPENDING ON CLINICAL CONDITION.  Note: This dictation was prepared with Dragon dictation along with smaller phrase technology. Any transcriptional errors that result from this process are unintentional.   Nicholes Mango M.D on 01/31/2018 at 3:24 PM  Between 7am to 6pm - Pager - [phone] After 6pm go to [email or web address] - password [email or web address] [facility]  Office  [phone]  CC: Primary care physician; Margo Common, PA

## 2018-01-31 NOTE — Evaluation (Signed)
Occupational Therapy Evaluation Patient Details Name: Jennifer [name] MRN: [id number] DOB: Oct 01, 1940 Today's Date: 01/31/2018    History of Present Illness Pt is a 77 y.o. female with a known history of hyperlipidemia, fatty liver and thrombocytopenia. Patient presented to the hospital for right hip pain, status post mechanical fall.  Patient was found with nondisplaced superior and inferior right pubic ramus fractures.    Clinical Impression   Pt seen for OT evaluation this date. Pt was independent in all ADLs prior to surgery, driving, with no other falls history in past year. Pt is eager to return to PLOF with less pain and improved safety and independence after recovery. Pt lives in a 1 story home with her 3 dogs and adult son. Pt currently requires MOD assist for LB dressing, toileting, and bathing while in seated or bed level position due to pain and limited AROM of R hip. Pt able to perform log roll technique to allow for placement of bed pan for toileting due to urgency and possible fear of pain, requiring supervision but no physical assist perform rolling to L side. Pt will benefit from skilled OT services to address noted deficits and functional impairments in order to maximize return to PLOF and minimize risk of falls and increased caregiver burden. Recommend STR upon discharge.      Follow Up Recommendations  SNF    Equipment Recommendations  Tub/shower seat    Recommendations for Other Services       Precautions / Restrictions Precautions Precautions: Fall Restrictions Weight Bearing Restrictions: Yes Other Position/Activity Restrictions: No WB restrictions noted in chart review, RLE WBAT       Mobility Bed Mobility Overal bed mobility: Needs Assistance Bed Mobility: Rolling Rolling: Supervision         General bed mobility comments: able to roll side to side to allow for bed pan to be placed without assist from therapist  Transfers                  General transfer comment: pt declined 2/2 need to use bed pan    Balance                                           ADL either performed or assessed with clinical judgement   ADL Overall ADL's : Needs assistance/impaired Eating/Feeding: Sitting;Independent   Grooming: Sitting;Independent   Upper Body Bathing: Sitting;Min guard   Lower Body Bathing: Sit to/from stand;Moderate assistance   Upper Body Dressing : Sitting;Min guard   Lower Body Dressing: Sit to/from stand;Moderate assistance     Toilet Transfer Details (indicate cue type and reason): pt performed bed mobility log rolling for toileting with bed pan, declined to trial BSC 2/2 need to go urgently and for fear of pain                 Vision Baseline Vision/History: No visual deficits Patient Visual Report: No change from baseline       Perception     Praxis      Pertinent Vitals/Pain Pain Assessment: No/denies pain(no pain at rest, increases with bed mobility)     Hand Dominance Left   Extremity/Trunk Assessment Upper Extremity Assessment Upper Extremity Assessment: Overall WFL for tasks assessed   Lower Extremity Assessment Lower Extremity Assessment: Generalized weakness;RLE deficits/detail;Defer to PT evaluation RLE: Unable to fully assess due to pain  Communication Communication Communication: No difficulties   Cognition Arousal/Alertness: Awake/alert Behavior During Therapy: WFL for tasks assessed/performed Overall Cognitive Status: Within Functional Limits for tasks assessed                                     General Comments       Exercises     Shoulder Instructions      Home Living Family/patient expects to be discharged to:: Private residence Living Arrangements: Children Available Help at Discharge: Family;Available PRN/intermittently Type of Home: House Home Access: Stairs to enter [employer] of Steps: 3 Entrance  Stairs-Rails: Right Home Layout: One level     Bathroom Shower/Tub: Tub/shower unit;Walk-in shower   Bathroom Toilet: Standard     Home Equipment: Environmental consultant - 2 wheels;Walker - 4 wheels;Bedside commode;Other (comment)(rollator)          Prior Functioning/Environment Level of Independence: Independent        Comments: Ind with amb community distances without AD, Ind with ADLs, no other fall history.  Current fall resulted while exiting a truck and twisted ankle resulting in a fall.          OT Problem List: Decreased strength;Decreased knowledge of use of DME or AE;Pain;Impaired balance (sitting and/or standing);Decreased range of motion      OT Treatment/Interventions: Self-care/ADL training;Balance training;Therapeutic exercise;Therapeutic activities;DME and/or AE instruction;Patient/family education    OT Goals(Current goals can be found in the care plan section) Acute Rehab OT Goals Patient Stated Goal: get out of bed and not use bed pan OT Goal Formulation: With patient Time For Goal Achievement: 02/14/18 Potential to Achieve Goals: Good ADL Goals Pt Will Perform Lower Body Dressing: with min assist;with adaptive equipment;sit to/from stand Pt Will Transfer to Toilet: with min assist;bedside commode;stand pivot transfer(LRAD for amb) Additional ADL Goal #1: Pt will perform bed mobility with supervision assist.  OT Frequency: Min 1X/week   Barriers to D/C:            Co-evaluation              AM-PAC PT "6 Clicks" Daily Activity     Outcome Measure Help from another person eating meals?: None Help from another person taking care of personal grooming?: None Help from another person toileting, which includes using toliet, bedpan, or urinal?: A Lot Help from another person bathing (including washing, rinsing, drying)?: A Lot Help from another person to put on and taking off regular upper body clothing?: A Little Help from another person to put on and taking off  regular lower body clothing?: A Lot 6 Click Score: 17   End of Session    Activity Tolerance: Patient tolerated treatment well Patient left: in bed;with call bell/phone within reach;with bed alarm set;Other (comment)(on bed pan)  OT Visit Diagnosis: Other abnormalities of gait and mobility (R26.89);Pain;Muscle weakness (generalized) (M62.81);History of falling (Z91.81) Pain - Right/Left: Right Pain - part of body: Hip                Time: 8366-2947 OT Time Calculation (min): 15 min Charges:  OT General Charges $OT Visit: 1 Visit OT Evaluation $OT Eval Moderate Complexity: 1 Mod  [name], MPH, MS, OTR/L ascom [phone] 01/31/18, 4:19 PM

## 2018-01-31 NOTE — Evaluation (Signed)
Physical Therapy Evaluation Patient Details Name: Jennifer [name] MRN: [id number] DOB: 1940-08-15 Today's Date: 01/31/2018   History of Present Illness  Pt is a 77 y.o. female with a known history of hyperlipidemia, fatty liver and thrombocytopenia. Patient presented to the hospital for right hip pain, status post mechanical fall.  Patient was found with nondisplaced superior and inferior right pubic ramus fractures.     Clinical Impression  Pt presents with deficits in strength, transfers, mobility, gait, and activity tolerance.  Pt required min A with sup to sit but was able to perform sit to sup without physical assistance.  Pt was CGA with sit to/from stand transfers with cues for proper sequencing.  In standing pt was able to lift the R foot from the floor multiple times with some effort but despite multiple attempts was unable to lift the L foot from the floor or even shuffle the L foot side to side.  Pt would not be safe to discharge to her prior living situation at this time secondary to profound deficits in mobility/gait.  Pt will benefit from PT services in a SNF setting upon discharge to safely address above deficits for decreased caregiver assistance and eventual return to PLOF.         Follow Up Recommendations SNF    Equipment Recommendations  None recommended by PT    Recommendations for Other Services       Precautions / Restrictions Precautions Precautions: Fall Restrictions Weight Bearing Restrictions: Yes Other Position/Activity Restrictions: No WB restrictions noted in chart review      Mobility  Bed Mobility Overal bed mobility: Needs Assistance Bed Mobility: Supine to Sit;Sit to Supine     Supine to sit: Min assist Sit to supine: Supervision   General bed mobility comments: Min A to come to full upright position during sup to sit but only extra effort required and no physical assistance during sit to sup  Transfers Overall transfer level: Needs  assistance Equipment used: Rolling walker (2 wheeled) Transfers: Sit to/from Stand Sit to Stand: Min guard         General transfer comment: Mod verbal cues for sequencing  Ambulation/Gait             General Gait Details: Pt unable to advance the LLE  Stairs            Wheelchair Mobility    Modified Rankin (Stroke Patients Only)       Balance Overall balance assessment: No apparent balance deficits (not formally assessed)                                           Pertinent Vitals/Pain Pain Assessment: No/denies pain(No pain at rest)    Home Living Family/patient expects to be discharged to:: Private residence Living Arrangements: Children Available Help at Discharge: Family;Available PRN/intermittently Type of Home: House Home Access: Stairs to enter Entrance Stairs-Rails: Right Entrance Stairs-Number of Steps: 3 Home Layout: One level Home Equipment: Walker - 2 wheels;Walker - 4 wheels;Bedside commode      Prior Function Level of Independence: Independent         Comments: Ind with amb community distances without AD, Ind with ADLs, no other fall history.  Current fall resulted while exiting a truck and twisted ankle resulting in a fall.       Hand Dominance   Dominant Hand: Left  Extremity/Trunk Assessment        Lower Extremity Assessment Lower Extremity Assessment: Generalized weakness;RLE deficits/detail RLE: Unable to fully assess due to pain       Communication   Communication: No difficulties  Cognition Arousal/Alertness: Awake/alert Behavior During Therapy: WFL for tasks assessed/performed Overall Cognitive Status: Within Functional Limits for tasks assessed                                        General Comments      Exercises Total Joint Exercises Ankle Circles/Pumps: AROM;Both;10 reps Quad Sets: Strengthening;Both;10 reps Gluteal Sets: Strengthening;Both;10 reps Long Arc Quad:  AROM;Both;10 reps Knee Flexion: AROM;Both;10 reps Marching in Standing: AROM;Right;10 reps Other Exercises Other Exercises: HEP education for BLE APs, GS, and QS x 10 each every 1-2 hours during the day   Assessment/Plan    PT Assessment Patient needs continued PT services  PT Problem List Decreased strength;Decreased activity tolerance;Decreased mobility       PT Treatment Interventions DME instruction;Gait training;Stair training;Functional mobility training;Balance training;Therapeutic exercise;Therapeutic activities;Patient/family education    PT Goals (Current goals can be found in the Care Plan section)  Acute Rehab PT Goals Patient Stated Goal: To walk better PT Goal Formulation: With patient Time For Goal Achievement: 02/13/18 Potential to Achieve Goals: Good    Frequency 7X/week   Barriers to discharge Inaccessible home environment;Decreased caregiver support      Co-evaluation               AM-PAC PT "6 Clicks" Daily Activity  Outcome Measure Difficulty turning over in bed (including adjusting bedclothes, sheets and blankets)?: Unable Difficulty moving from lying on back to sitting on the side of the bed? : Unable Difficulty sitting down on and standing up from a chair with arms (e.g., wheelchair, bedside commode, etc,.)?: Unable Help needed moving to and from a bed to chair (including a wheelchair)?: A Lot Help needed walking in hospital room?: Total Help needed climbing 3-5 steps with a railing? : Total 6 Click Score: 7    End of Session Equipment Utilized During Treatment: Gait belt Activity Tolerance: Patient limited by pain Patient left: in bed;with family/visitor present;with bed alarm set;with SCD's reapplied;with call bell/phone within reach Nurse Communication: Mobility status PT Visit Diagnosis: Muscle weakness (generalized) (M62.81);Difficulty in walking, not elsewhere classified (R26.2)    Time: 3888-2800 PT Time Calculation (min) (ACUTE  ONLY): 42 min   Charges:   PT Evaluation $PT Eval Low Complexity: 1 Low PT Treatments $Therapeutic Exercise: 8-22 mins        D. [name] Yalonda Sample PT, DPT 01/31/18, 11:25 AM

## 2018-01-31 NOTE — NC FL2 (Signed)
[place] LEVEL OF CARE SCREENING TOOL     IDENTIFICATION  Patient Name: Jennifer [name] Birthdate: 1940-07-18 Sex: female Admission Date (Current Location): 01/30/2018  [place] and [place] Number:  Engineering geologist and Address:  [facility], [address], [place], [place] [address]      Provider Number: [id number]  Attending Physician Name and Address:  Nicholes Mango, MD  Relative Name and Phone Number:       Current Level of Care: Hospital Recommended Level of Care: [place] Prior Approval Number:    Date Approved/Denied:   PASRR Number: [id number])  Discharge Plan: SNF    Current Diagnoses: Patient Active Problem List   Diagnosis Date Noted  . Pelvic fracture ([place]) 01/31/2018  . Multiple gastric polyps   . Hx of colonic polyps   . Secondary esophageal varices without bleeding ([place])   . Acute upper GI bleed 07/12/2017  . Liver cirrhosis secondary to NASH ([place])   . Acute right-sided low back pain 01/31/2017  . Carpal tunnel syndrome on right 09/28/2016  . Obesity (BMI 30.0-34.9) 09/20/2016  . Upper respiratory infection 03/17/2016  . Thrombocytopenia ([place]) 12/29/2015  . Primary osteoarthritis of left knee 09/30/2015  . Special screening for malignant neoplasms, colon   . Benign neoplasm of ascending colon   . Benign neoplasm of transverse colon   . Benign neoplasm of sigmoid colon   . Arthritis 10/21/2014  . Allergic rhinitis 10/14/2014  . Airway hyperreactivity 10/14/2014  . Back ache 10/14/2014  . Bell palsy 10/14/2014  . Controlled diabetes mellitus type II without complication ([place]) 50/07/7046  . Elevation of level of transaminase or lactic acid dehydrogenase (LDH) 10/14/2014  . Calculus of gallbladder 10/14/2014  . HLD (hyperlipidemia) 10/14/2014  . BP (high blood pressure) 10/14/2014  . Gonalgia 10/14/2014  . Benign neoplasm of soft tissues 10/14/2014  . NASH (nonalcoholic steatohepatitis)  10/14/2014  . Adiposity 10/14/2014  . Esophagitis, reflux 10/14/2014  . Enlargement of spleen 10/14/2014  . Change in blood platelet count 10/14/2014  . Avitaminosis D 10/14/2014    Orientation RESPIRATION BLADDER Height & Weight     Self, Time, Situation, Place  Normal Continent Weight: 164 lb 14.5 oz (74.8 kg) Height:  5' 1"  (154.9 cm)  BEHAVIORAL SYMPTOMS/MOOD NEUROLOGICAL BOWEL NUTRITION STATUS      Continent Diet(Diet: Heart Healthy )  AMBULATORY STATUS COMMUNICATION OF NEEDS Skin   Extensive Assist Verbally Surgical wounds                       Personal Care Assistance Level of Assistance  Bathing, Feeding, Dressing Bathing Assistance: Limited assistance Feeding assistance: Independent Dressing Assistance: Limited assistance     Functional Limitations Info  Sight, Hearing, Speech Sight Info: Adequate Hearing Info: Adequate Speech Info: Adequate    SPECIAL CARE FACTORS FREQUENCY  PT (By licensed PT), OT (By licensed OT)     PT Frequency: (5) OT Frequency: (5)            Contractures      Additional Factors Info  Code Status, Allergies Code Status Info: (Full Code. ) Allergies Info: (Codeine, Hydrocodone-acetaminophen, Nitrofurantoin, Nitrofurantoin Monohyd Macro, Penicillins, Sulfa Antibiotics)           Current Medications (01/31/2018):  This is the current hospital active medication list Current Facility-Administered Medications  Medication Dose Route Frequency Provider Last Rate Last Dose  . albuterol (PROVENTIL) (2.5 MG/3ML) 0.083% nebulizer solution 2.5 mg  2.5 mg  Inhalation Q6H PRN Amelia Jo, MD      . bisacodyl (DULCOLAX) EC tablet 5 mg  5 mg Oral Daily PRN Amelia Jo, MD      . docusate sodium (COLACE) capsule 100 mg  100 mg Oral BID Amelia Jo, MD   100 mg at 01/31/18 0932  . [START ON 02/06/2018] ferrous sulfate (FER-IN-SOL) 75 (15 Fe) MG/ML solution 45 mg  45 mg Oral Weekly Amelia Jo, MD      . furosemide (LASIX) tablet 20  mg  20 mg Oral Daily Amelia Jo, MD      . HYDROmorphone (DILAUDID) injection 0.5 mg  0.5 mg Intravenous Q4H PRN Amelia Jo, MD      . magnesium oxide (MAG-OX) tablet 400 mg  400 mg Oral BID Amelia Jo, MD   400 mg at 01/31/18 0932  . nadolol (CORGARD) tablet 20 mg  20 mg Oral Daily Amelia Jo, MD      . ondansetron [facility]) tablet 4 mg  4 mg Oral Q6H PRN Amelia Jo, MD       Or  . ondansetron [facility]) injection 4 mg  4 mg Intravenous Q6H PRN Amelia Jo, MD      . spironolactone (ALDACTONE) tablet 100 mg  100 mg Oral Daily Amelia Jo, MD      . traZODone (DESYREL) tablet 25 mg  25 mg Oral QHS PRN Amelia Jo, MD         Discharge Medications: Please see discharge summary for a list of discharge medications.  Relevant Imaging Results:  Relevant Lab Results:   Additional Information (SSN: [id number])  Arshiya Jakes, Veronia Beets, LCSW

## 2018-01-31 NOTE — H&P (Signed)
[facility] [place] NAME: Jennifer [name]    MR#:  [id number]  DATE OF BIRTH:  July 31, 1940  DATE OF ADMISSION:  01/30/2018  PRIMARY CARE PHYSICIAN: Chrismon, Vickki Muff, PA   REQUESTING/REFERRING PHYSICIAN:   CHIEF COMPLAINT:   Chief Complaint  Patient presents with  . Fall    HISTORY OF PRESENT ILLNESS: Jennifer [name]  is a 77 y.o. female with a known history of  hyperlipidemia, fatty liver and thrombocytopenia. Patient presented to the hospital for right hip pain, status post mechanical fall.  Upon evaluation in the emergency room, patient was found with pelvic fracture. She is admitted for further evaluation and treatment.  PAST MEDICAL HISTORY:   Past Medical History:  Diagnosis Date  . Allergy   . Arthritis   . Asthma   . Colon polyp   . Fatty liver   . GERD (gastroesophageal reflux disease)   . Hyperlipidemia   . Hypertension   . Motion sickness    boats    PAST SURGICAL HISTORY:  Past Surgical History:  Procedure Laterality Date  . ABDOMINAL HYSTERECTOMY  1978  . COLONOSCOPY WITH PROPOFOL N/A 02/01/2015   Procedure: COLONOSCOPY WITH PROPOFOL;  Surgeon: Lucilla Lame, MD;  Location: [place];  Service: Endoscopy;  Laterality: N/A;  Diabetic - oral meds  . COLONOSCOPY WITH PROPOFOL N/A 11/22/2017   Procedure: COLONOSCOPY WITH PROPOFOL;  Surgeon: Lin Landsman, MD;  Location: [facility] ENDOSCOPY;  Service: Gastroenterology;  Laterality: N/A;  . ESOPHAGOGASTRODUODENOSCOPY (EGD) WITH PROPOFOL N/A 07/12/2017   Procedure: ESOPHAGOGASTRODUODENOSCOPY (EGD) WITH PROPOFOL;  Surgeon: Lin Landsman, MD;  Location: [facility] ENDOSCOPY;  Service: Gastroenterology;  Laterality: N/A;  . ESOPHAGOGASTRODUODENOSCOPY (EGD) WITH PROPOFOL N/A 08/16/2017   Procedure: ESOPHAGOGASTRODUODENOSCOPY (EGD) WITH PROPOFOL;  Surgeon: Lin Landsman, MD;  Location: [facility] ENDOSCOPY;  Service: Gastroenterology;  Laterality: N/A;  .  ESOPHAGOGASTRODUODENOSCOPY (EGD) WITH PROPOFOL N/A 09/20/2017   Procedure: ESOPHAGOGASTRODUODENOSCOPY (EGD) WITH PROPOFOL;  Surgeon: Lin Landsman, MD;  Location: [facility] ENDOSCOPY;  Service: Gastroenterology;  Laterality: N/A;  . ESOPHAGOGASTRODUODENOSCOPY (EGD) WITH PROPOFOL N/A 11/01/2017   Procedure: ESOPHAGOGASTRODUODENOSCOPY (EGD) WITH PROPOFOL;  Surgeon: Lin Landsman, MD;  Location: [facility] ENDOSCOPY;  Service: Gastroenterology;  Laterality: N/A;  . ESOPHAGOGASTRODUODENOSCOPY (EGD) WITH PROPOFOL N/A 01/17/2018   Procedure: ESOPHAGOGASTRODUODENOSCOPY (EGD) WITH PROPOFOL;  Surgeon: Lin Landsman, MD;  Location: [facility] ENDOSCOPY;  Service: Gastroenterology;  Laterality: N/A;  . POLYPECTOMY  02/01/2015   Procedure: POLYPECTOMY;  Surgeon: Lucilla Lame, MD;  Location: [place];  Service: Endoscopy;;  . TONSILLECTOMY AND ADENOIDECTOMY      SOCIAL HISTORY:  Social History   Tobacco Use  . Smoking status: Former Smoker    Packs/day: 0.50    Years: 6.00    Pack years: 3.00    Types: Cigarettes    Last attempt to quit: 05/08/1987    Years since quitting: 30.7  . Smokeless tobacco: Never Used  Substance Use Topics  . Alcohol use: Never    Frequency: Never    FAMILY HISTORY:  Family History  Problem Relation Age of Onset  . Dementia Mother   . Alcohol abuse Father   . Cancer Sister        lung  . Lymphoma Sister   . Lymphoma Sister     DRUG ALLERGIES:  Allergies  Allergen Reactions  . Codeine Other (See Comments)    Pt denies  . Hydrocodone-Acetaminophen Other (See Comments)    Pt denies  .  Nitrofurantoin Other (See Comments)  . Nitrofurantoin Monohyd Macro     "Sugar got high"  . Penicillins     Has patient had a PCN reaction causing immediate rash, facial/tongue/throat swelling, SOB or lightheadedness with hypotension: Unknown Has patient had a PCN reaction causing severe rash involving mucus membranes or skin necrosis: Unknown Has patient had a PCN  reaction that required hospitalization: Unknown Has patient had a PCN reaction occurring within the last 10 years: Unknown If all of the above answers are "NO", then may proceed with Cephalosporin use.  . Sulfa Antibiotics Other (See Comments)    REVIEW OF SYSTEMS:   CONSTITUTIONAL: No fever, fatigue or weakness.  EYES: No changes in vision.  EARS, NOSE, AND THROAT: No tinnitus or ear pain.  RESPIRATORY: No cough, shortness of breath, wheezing or hemoptysis.  CARDIOVASCULAR: No chest pain, orthopnea, edema.  GASTROINTESTINAL: No nausea, vomiting, diarrhea or abdominal pain.  GENITOURINARY: No dysuria, hematuria.  ENDOCRINE: No polyuria, nocturia. HEMATOLOGY: No bleeding. SKIN: No rash or lesion. MUSCULOSKELETAL: Positive for right hip pain.  Patient is not able to ambulate due to pain. NEUROLOGIC: No focal weakness.  PSYCHIATRY: No anxiety or depression.   MEDICATIONS AT HOME:  Prior to Admission medications   Medication Sig Start Date End Date Taking? Authorizing Provider  furosemide (LASIX) 40 MG tablet Take 0.5 tablets (20 mg total) by mouth daily. 01/17/18  Yes Vanga, Tally Due, MD  IRON PO Take 45 mg by mouth once a week.    Yes [provider]  magnesium oxide (MAG-OX) 400 MG tablet Take 400 mg by mouth 2 (two) times daily.   Yes [provider]  nadolol (CORGARD) 20 MG tablet TAKE ONE TABLET BY MOUTH EVERY DAY. HOLDIF BLOOD PRESSURE IS LOW (SBP<90) OR FEELING DIZZY. Patient taking differently: Take 20 mg by mouth daily.  01/01/18  Yes Vanga, Tally Due, MD  spironolactone (ALDACTONE) 100 MG tablet TAKE ONE TABLET EVERY DAY 01/01/18  Yes Vanga, Tally Due, MD  Vitamin D, Ergocalciferol, (DRISDOL) 50000 units CAPS capsule TAKE 1 CAPSULE EVERY WEEK (NEED TO SCHEDULE FOLLOW UP LABS TO CHECK VITAMIN D LEVEL) Patient taking differently: Take 50,000 Units by mouth every 30 (thirty) days.  01/27/18  Yes Chrismon, Vickki Muff, PA  albuterol (PROVENTIL HFA;VENTOLIN  HFA) 108 (90 Base) MCG/ACT inhaler Inhale 2 puffs into the lungs every 6 (six) hours as needed for wheezing or shortness of breath. 03/30/17   Chrismon, Vickki Muff, PA  Lancets Misc. (ACCU-CHEK FASTCLIX LANCET) KIT Test fasting blood sugar daily. 12/05/15   Chrismon, Vickki Muff, PA  metFORMIN (GLUCOPHAGE) 500 MG tablet TAKE 1 TABLET TWICE DAILY WITH MEALS (NEED MD APPOINTMENT) Patient not taking: Reported on 01/17/2018 09/20/17   Chrismon, Vickki Muff, PA  pantoprazole (PROTONIX) 40 MG tablet TAKE ONE TABLET BY MOUTH TWICE DAILY Patient not taking: Reported on 01/17/2018 12/18/17   Lin Landsman, MD      PHYSICAL EXAMINATION:   VITAL SIGNS: Blood pressure 132/70, pulse 63, temperature 98 F (36.7 C), temperature source Oral, resp. rate 18, height 5' 1" (1.549 m), weight 73.5 kg, SpO2 99 %.  GENERAL:  77 y.o.-year-old patient lying in the bed with moderate distress, due to pain.  EYES: Pupils equal, round, reactive to light and accommodation. No scleral icterus. Extraocular muscles intact.  HEENT: Head atraumatic, normocephalic. Oropharynx and nasopharynx clear.  NECK:  Supple, no jugular venous distention. No thyroid enlargement, no tenderness.  LUNGS: Normal breath sounds bilaterally, no wheezing, rales,rhonchi or crepitation. No  use of accessory muscles of respiration.  CARDIOVASCULAR: S1, S2 normal. No S3/S4.  ABDOMEN: Soft, nontender, nondistended. Bowel sounds present. No organomegaly or mass.  EXTREMITIES: There is reduced range of motion at bilateral hip joints, due to pain.  Patient is unable to ambulate due to pain. NEUROLOGIC: Cranial nerves II through XII are intact. Muscle strength 5/5 in all extremities. Sensation intact.   PSYCHIATRIC: The patient is alert and oriented x 3.  SKIN: No obvious rash, lesion, or ulcer.   LABORATORY PANEL:   CBC Recent Labs  Lab 01/30/18 2344  WBC 4.0  HGB 12.5  HCT 34.6*  PLT 59*  MCV 90.4  MCH 32.7  MCHC 36.1*  RDW 14.0    ------------------------------------------------------------------------------------------------------------------  Chemistries  Recent Labs  Lab 01/30/18 2344  NA 131*  K 4.5  CL 93*  CO2 30  GLUCOSE 118*  BUN 14  CREATININE 0.86  CALCIUM 9.5   ------------------------------------------------------------------------------------------------------------------ estimated creatinine clearance is 49.4 mL/min (by C-G formula based on SCr of 0.86 mg/dL). ------------------------------------------------------------------------------------------------------------------ No results for input(s): TSH, T4TOTAL, T3FREE, THYROIDAB in the last 72 hours.  Invalid input(s): FREET3   Coagulation profile No results for input(s): INR, PROTIME in the last 168 hours. ------------------------------------------------------------------------------------------------------------------- No results for input(s): DDIMER in the last 72 hours. -------------------------------------------------------------------------------------------------------------------  Cardiac Enzymes No results for input(s): CKMB, TROPONINI, MYOGLOBIN in the last 168 hours.  Invalid input(s): CK ------------------------------------------------------------------------------------------------------------------ Invalid input(s): POCBNP  ---------------------------------------------------------------------------------------------------------------  Urinalysis    Component Value Date/Time   BILIRUBINUR negative 01/16/2017 0922   PROTEINUR negative 01/16/2017 0922   UROBILINOGEN 0.2 01/16/2017 0922   NITRITE negative 01/16/2017 0922   LEUKOCYTESUR Moderate (2+) (A) 01/16/2017 0922     RADIOLOGY: Dg Chest 1 View  Result Date: 01/30/2018 CLINICAL DATA:  77 year old female with fall and pain. EXAM: CHEST  1 VIEW COMPARISON:  Chest radiograph dated 07/12/2017 FINDINGS: The lungs are clear. There is no pleural effusion or  pneumothorax. The cardiac silhouette is within normal limits. Probable small right hilar granuloma. A 17 mm apparent nodular density in the right suprahilar region may be artifactual or represent a mildly enlarged lymph node or partially calcified granuloma. Clinical correlation and attention follow-up imaging recommended. Chest CT may provide better evaluation if clinically indicated. Partially visualized heterogeneous area in the proximal left humerus not well evaluated. Further evaluation with dedicated radiograph on a nonemergent basis recommended. IMPRESSION: No acute cardiopulmonary process. Electronically Signed   By: Anner Crete M.D.   On: 01/30/2018 22:01   Dg Hip Unilat W Or Wo Pelvis 2-3 Views Right  Result Date: 01/30/2018 CLINICAL DATA:  Fall with right hip pain EXAM: DG HIP (WITH OR WITHOUT PELVIS) 2-3V RIGHT COMPARISON:  08/09/2017 CT abdomen/pelvis FINDINGS: Slight cortical discontinuity in the superior right pubic ramus compatible with nondisplaced fracture. Minimal cortical irregularity in the inferior pubic ramus, new, compatible with nondisplaced fracture. No additional fractures. No right hip dislocation. No suspicious focal osseous lesion. Mild osteoarthritis in the weight-bearing portion of the right hip joint. Degenerative changes in the visualized lower lumbar spine. IMPRESSION: Nondisplaced superior and inferior right pubic ramus fractures. Electronically Signed   By: Ilona Sorrel M.D.   On: 01/30/2018 22:00    EKG: Orders placed or performed during the hospital encounter of 07/12/17  . EKG 12-Lead  . EKG 12-Lead  . EKG    IMPRESSION AND PLAN:  1.  Acute pelvic fracture, status post mechanical fall.  This does not require surgery, per orthopedics.  Continue pain control.  Will  have PT/OT eval and treat the patient. 2.  Chronic thrombocytopenia.  Platelet count is stable at 59.  Will hold any anticoagulants and continue to monitor platelet count closely.  Continue to  follow-up with hematology as outpatient.  All the records are reviewed and case discussed with ED provider. Management plans discussed with the patient, family and they are in agreement.  CODE STATUS: Full Code Status History    Date Active Date Inactive Code Status Order ID Comments User Context   07/12/2017 1333 07/15/2017 1616 Full Code [id number]  Epifanio Lesches, MD ED       TOTAL TIME TAKING CARE OF THIS PATIENT: 40 minutes.    Amelia Jo M.D on 01/31/2018 at 1:08 AM  Between 7am to 6pm - Pager - [phone]  After 6pm go to [email or web address] - password [email or web address] [facility] [place] [facility]  [phone]  CC: Primary care physician; Chrismon, Vickki Muff, [place]

## 2018-01-31 NOTE — Clinical Social Work Note (Signed)
Clinical Social Work Assessment  Patient Details  Name: Jennifer [name] MRN: [id number] Date of Birth: [birth year]  Date of referral:  01/31/18               Reason for consult:  Facility Placement                Permission sought to share information with:  Chartered certified accountant granted to share information::  Yes, Verbal Permission Granted  Name::      [place]::   [place]   Relationship::     Contact Information:     Housing/Transportation Living arrangements for the past 2 months:  [place] of Information:  Patient Patient Interpreter Needed:  None Criminal Activity/Legal Involvement Pertinent to Current Situation/Hospitalization:  No - Comment as needed Significant Relationships:  Adult Children Lives with:  Adult Children Do you feel safe going back to the place where you live?  Yes Need for family participation in patient care:  Yes (Comment)  Care giving concerns:  Patient lives in [place] with her son Jennifer [name].    Social Worker assessment / plan:  Holiday representative (CSW) reviewed chart and noted that PT is recommending SNF. CSW met with patient and her pastor and significant other were at bedside. Patient was alert and oriented X4 and was laying in the bed. CSW introduced self and explained role of CSW department. Per patient she lives with her son. CSW explained SNF process and that [facility] will have to approve it. Patient is agreeable to SNF search in [facility]. FL2 complete and faxed out.  CSW presented bed offers to patient and she chose [facility]. Patient is okay with a semi-private room at [facility]. Per [facility] admissions coordinator at [facility] she will start [facility] authorization today. CSW will continue to follow and assist as needed.   Employment status:  Retired Nurse, adult PT Recommendations:  [place] / Referral to  community resources:  [place]  Patient/Family's Response to care:  Patient chose [place].   Patient/Family's Understanding of and Emotional Response to Diagnosis, Current Treatment, and Prognosis:  Patient was very pleasant and thanked CSW for assistance.   Emotional Assessment Appearance:  Appears stated age Attitude/Demeanor/Rapport:    Affect (typically observed):  Accepting, Adaptable, Pleasant Orientation:  Oriented to Self, Oriented to Place, Oriented to  Time, Oriented to Situation Alcohol / Substance use:  Not Applicable Psych involvement (Current and /or in the community):  No (Comment)  Discharge Needs  Concerns to be addressed:  Discharge Planning Concerns Readmission within the last 30 days:  No Current discharge risk:  Dependent with Mobility Barriers to Discharge:  Continued Medical Work up   [employer], Veronia Beets, LCSW 01/31/2018, 5:09 PM

## 2018-02-01 DIAGNOSIS — S32511D Fracture of superior rim of right pubis, subsequent encounter for fracture with routine healing: Secondary | ICD-10-CM | POA: Diagnosis not present

## 2018-02-01 DIAGNOSIS — K219 Gastro-esophageal reflux disease without esophagitis: Secondary | ICD-10-CM | POA: Diagnosis not present

## 2018-02-01 DIAGNOSIS — M810 Age-related osteoporosis without current pathological fracture: Secondary | ICD-10-CM | POA: Diagnosis not present

## 2018-02-01 DIAGNOSIS — S329XXA Fracture of unspecified parts of lumbosacral spine and pelvis, initial encounter for closed fracture: Secondary | ICD-10-CM | POA: Diagnosis not present

## 2018-02-01 DIAGNOSIS — D122 Benign neoplasm of ascending colon: Secondary | ICD-10-CM | POA: Diagnosis not present

## 2018-02-01 DIAGNOSIS — Z7401 Bed confinement status: Secondary | ICD-10-CM | POA: Diagnosis not present

## 2018-02-01 DIAGNOSIS — Z86010 Personal history of colonic polyps: Secondary | ICD-10-CM | POA: Diagnosis not present

## 2018-02-01 DIAGNOSIS — K7581 Nonalcoholic steatohepatitis (NASH): Secondary | ICD-10-CM | POA: Diagnosis not present

## 2018-02-01 DIAGNOSIS — I10 Essential (primary) hypertension: Secondary | ICD-10-CM | POA: Diagnosis not present

## 2018-02-01 DIAGNOSIS — E611 Iron deficiency: Secondary | ICD-10-CM | POA: Diagnosis not present

## 2018-02-01 DIAGNOSIS — J4520 Mild intermittent asthma, uncomplicated: Secondary | ICD-10-CM | POA: Diagnosis not present

## 2018-02-01 DIAGNOSIS — S32591A Other specified fracture of right pubis, initial encounter for closed fracture: Secondary | ICD-10-CM | POA: Diagnosis not present

## 2018-02-01 DIAGNOSIS — M6281 Muscle weakness (generalized): Secondary | ICD-10-CM | POA: Diagnosis not present

## 2018-02-01 DIAGNOSIS — M1990 Unspecified osteoarthritis, unspecified site: Secondary | ICD-10-CM | POA: Diagnosis not present

## 2018-02-01 DIAGNOSIS — D696 Thrombocytopenia, unspecified: Secondary | ICD-10-CM | POA: Diagnosis not present

## 2018-02-01 DIAGNOSIS — S32501S Unspecified fracture of right pubis, sequela: Secondary | ICD-10-CM | POA: Diagnosis not present

## 2018-02-01 DIAGNOSIS — E119 Type 2 diabetes mellitus without complications: Secondary | ICD-10-CM | POA: Diagnosis not present

## 2018-02-01 DIAGNOSIS — E785 Hyperlipidemia, unspecified: Secondary | ICD-10-CM | POA: Diagnosis not present

## 2018-02-01 DIAGNOSIS — M8000XA Age-related osteoporosis with current pathological fracture, unspecified site, initial encounter for fracture: Secondary | ICD-10-CM | POA: Diagnosis not present

## 2018-02-01 DIAGNOSIS — D649 Anemia, unspecified: Secondary | ICD-10-CM | POA: Diagnosis not present

## 2018-02-01 DIAGNOSIS — E669 Obesity, unspecified: Secondary | ICD-10-CM | POA: Diagnosis not present

## 2018-02-01 DIAGNOSIS — J45909 Unspecified asthma, uncomplicated: Secondary | ICD-10-CM | POA: Diagnosis not present

## 2018-02-01 DIAGNOSIS — S32591D Other specified fracture of right pubis, subsequent encounter for fracture with routine healing: Secondary | ICD-10-CM | POA: Diagnosis not present

## 2018-02-01 DIAGNOSIS — K7460 Unspecified cirrhosis of liver: Secondary | ICD-10-CM | POA: Diagnosis not present

## 2018-02-01 DIAGNOSIS — R2689 Other abnormalities of gait and mobility: Secondary | ICD-10-CM | POA: Diagnosis not present

## 2018-02-01 DIAGNOSIS — R600 Localized edema: Secondary | ICD-10-CM | POA: Diagnosis not present

## 2018-02-01 DIAGNOSIS — E559 Vitamin D deficiency, unspecified: Secondary | ICD-10-CM | POA: Diagnosis not present

## 2018-02-01 LAB — CBC
HCT: 30.6 % — ABNORMAL LOW (ref 35.0–47.0)
Hemoglobin: 11.2 g/dL — ABNORMAL LOW (ref 12.0–16.0)
MCH: 32.9 pg (ref 26.0–34.0)
MCHC: 36.5 g/dL — ABNORMAL HIGH (ref 32.0–36.0)
MCV: 90.1 fL (ref 80.0–100.0)
Platelets: 48 10*3/uL — ABNORMAL LOW (ref 150–440)
RBC: 3.4 MIL/uL — AB (ref 3.80–5.20)
RDW: 13.8 % (ref 11.5–14.5)
WBC: 3.1 10*3/uL — ABNORMAL LOW (ref 3.6–11.0)

## 2018-02-01 LAB — GLUCOSE, CAPILLARY: Glucose-Capillary: 96 mg/dL (ref 70–99)

## 2018-02-01 MED ORDER — DOCUSATE SODIUM 100 MG PO CAPS: 100.0000 mg | ORAL_CAPSULE | Freq: Two times a day (BID) | ORAL | 0 refills | Status: DC | PRN

## 2018-02-01 MED ORDER — TRAMADOL HCL 50 MG PO TABS
50.0000 mg | ORAL_TABLET | Freq: Four times a day (QID) | ORAL | Status: DC | PRN
  Administered 2018-02-01: 50 mg via ORAL
  Filled 2018-02-01: qty 1

## 2018-02-01 MED ORDER — TRAMADOL HCL 50 MG PO TABS: 50.0000 mg | ORAL_TABLET | Freq: Four times a day (QID) | ORAL | 0 refills | Status: DC | PRN

## 2018-02-01 NOTE — Discharge Instructions (Signed)
Follow-up with primary care physician at the facility in 3 to 4 days Please monitor platelet count closely

## 2018-02-01 NOTE — Care Management Important Message (Signed)
Important Message  Patient Details  Name: IONA STAY MRN: [id number] Date of Birth: 1941/04/16   Medicare Important Message Given:       Jolly Mango, RN 02/01/2018, 12:40 PM

## 2018-02-01 NOTE — Progress Notes (Signed)
Patient is medically stable for D/C to [facility] today. [facility] SNF authorization has been received, authorization # [id number]. Per [facility] admissions coordinator at [facility] patient can come today to room 202-A. RN will call report at [phone] and arrange EMS for transport. Clinical Education officer, museum (CSW) sent D/C orders to [facility] [employer] [facility] via [employer]. Patient is aware of above. CSW left patient's son [name] a Advertising account executive. Please reconsult if future social work needs arise. CSW signing off.   [employer], LCSW [phone]

## 2018-02-01 NOTE — Discharge Summary (Signed)
[facility] NAME: Jennifer [name]    MR#:  [id number]  DATE OF BIRTH:  06/06/1940  DATE OF ADMISSION:  01/30/2018 ADMITTING PHYSICIAN: Amelia Jo, MD  DATE OF DISCHARGE: 02/01/18 PRIMARY CARE PHYSICIAN: Chrismon, Vickki Muff, PA    ADMISSION DIAGNOSIS:  Closed fracture of ramus of right pubis, initial encounter ([place]) [S32.591A]  DISCHARGE DIAGNOSIS:  Active Problems:   Pelvic fracture ([place])   SECONDARY DIAGNOSIS:   Past Medical History:  Diagnosis Date  . Allergy   . Arthritis   . Asthma   . Colon polyp   . Fatty liver   . GERD (gastroesophageal reflux disease)   . Hyperlipidemia   . Hypertension   . Motion sickness    boats    HOSPITAL COURSE:  HISTORY OF PRESENT ILLNESS: Jennifer [name]  is a 77 y.o. female with a known history of  hyperlipidemia, fatty liver and thrombocytopenia. Patient presented to the hospital for right hip pain, status post mechanical fall.  Upon evaluation in the emergency room, patient was found with pelvic fracture. She is admitted for further evaluation and treatment.    Acute pelvic fracture,status post mechanical fall.This does not require surgery, per orthopedics.  Continue pain control. PT recommending skilled nursing facility.  Patient is agreeable 2.Chronic thrombocytopenia.Platelet count is stable at 59-46,000-48,000.  Continue close monitoring and repeat CBC in 3 to 4 days No active bleeding or bruising noticedWill hold any anticoagulants and continue to monitor platelet count closely.Continue to follow-up with hematology as outpatient. 3.  GERD PPI 4.  Hypertension-renew home medication nadolol and holding Aldactone and Lasix in view of soft blood pressure 5 hyperlipidemia-not on any medications PCP to consider getting outpatient fasting lipid panel     DISCHARGE CONDITIONS:   Stable   CONSULTS OBTAINED:     PROCEDURES  None   DRUG ALLERGIES:    Allergies  Allergen Reactions  . Codeine Other (See Comments)    Pt denies  . Hydrocodone-Acetaminophen Other (See Comments)    Pt denies  . Nitrofurantoin Other (See Comments)  . Nitrofurantoin Monohyd Macro     "Sugar got high"  . Penicillins     Has patient had a PCN reaction causing immediate rash, facial/tongue/throat swelling, SOB or lightheadedness with hypotension: Unknown Has patient had a PCN reaction causing severe rash involving mucus membranes or skin necrosis: Unknown Has patient had a PCN reaction that required hospitalization: Unknown Has patient had a PCN reaction occurring within the last 10 years: Unknown If all of the above answers are "NO", then may proceed with Cephalosporin use.  . Sulfa Antibiotics Other (See Comments)    DISCHARGE MEDICATIONS:   Allergies as of 02/01/2018      Reactions   Codeine Other (See Comments)   Pt denies   Hydrocodone-acetaminophen Other (See Comments)   Pt denies   Nitrofurantoin Other (See Comments)   Nitrofurantoin Monohyd Macro    "Sugar got high"   Penicillins    Has patient had a PCN reaction causing immediate rash, facial/tongue/throat swelling, SOB or lightheadedness with hypotension: Unknown Has patient had a PCN reaction causing severe rash involving mucus membranes or skin necrosis: Unknown Has patient had a PCN reaction that required hospitalization: Unknown Has patient had a PCN reaction occurring within the last 10 years: Unknown If all of the above answers are "NO", then may proceed with Cephalosporin use.   Sulfa Antibiotics Other (See Comments)      Medication List  STOP taking these medications   furosemide 40 MG tablet Commonly known as:  LASIX   metFORMIN 500 MG tablet Commonly known as:  GLUCOPHAGE   pantoprazole 40 MG tablet Commonly known as:  PROTONIX   spironolactone 100 MG tablet Commonly known as:  ALDACTONE     TAKE these medications   ACCU-CHEK FASTCLIX LANCET Kit Test fasting  blood sugar daily.   albuterol 108 (90 Base) MCG/ACT inhaler Commonly known as:  PROVENTIL HFA;VENTOLIN HFA Inhale 2 puffs into the lungs every 6 (six) hours as needed for wheezing or shortness of breath.   docusate sodium 100 MG capsule Commonly known as:  COLACE Take 1 capsule (100 mg total) by mouth 2 (two) times daily as needed for mild constipation or moderate constipation.   IRON PO Take 45 mg by mouth once a week.   magnesium oxide 400 MG tablet Commonly known as:  MAG-OX Take 400 mg by mouth 2 (two) times daily.   nadolol 20 MG tablet Commonly known as:  CORGARD TAKE ONE TABLET BY MOUTH EVERY DAY. HOLDIF BLOOD PRESSURE IS LOW (SBP<90) OR FEELING DIZZY. What changed:  See the new instructions.   traMADol 50 MG tablet Commonly known as:  ULTRAM Take 1 tablet (50 mg total) by mouth every 6 (six) hours as needed for moderate pain.   Vitamin D (Ergocalciferol) 50000 units Caps capsule Commonly known as:  DRISDOL TAKE 1 CAPSULE EVERY WEEK (NEED TO SCHEDULE FOLLOW UP LABS TO CHECK VITAMIN D LEVEL) What changed:  See the new instructions.        DISCHARGE INSTRUCTIONS:  Follow-up with primary care physician at the facility in 3 to 4 days Please monitor platelet count closely   DIET:  Cardiac diet  DISCHARGE CONDITION:  Stable  ACTIVITY:  Activity as tolerated  OXYGEN:  Home Oxygen: No.   Oxygen Delivery: room air  DISCHARGE LOCATION:  nursing home   If you experience worsening of your admission symptoms, develop shortness of breath, life threatening emergency, suicidal or homicidal thoughts you must seek medical attention immediately by calling 911 or calling your MD immediately  if symptoms less severe.  You Must read complete instructions/literature along with all the possible adverse reactions/side effects for all the Medicines you take and that have been prescribed to you. Take any new Medicines after you have completely understood and accpet all the  possible adverse reactions/side effects.   Please note  You were cared for by a hospitalist during your hospital stay. If you have any questions about your discharge medications or the care you received while you were in the hospital after you are discharged, you can call the unit and asked to speak with the hospitalist on call if the hospitalist that took care of you is not available. Once you are discharged, your primary care physician will handle any further medical issues. Please note that NO REFILLS for any discharge medications will be authorized once you are discharged, as it is imperative that you return to your primary care physician (or establish a relationship with a primary care physician if you do not have one) for your aftercare needs so that they can reassess your need for medications and monitor your lab values.     Today  Chief Complaint  Patient presents with  . Fall   Patient is resting comfortably.  Pain is better.  Wants to go to rehab center.  Had a bowel movement yesterday  ROS:  CONSTITUTIONAL: Denies fevers, chills. Denies any fatigue,  weakness.  EYES: Denies blurry vision, double vision, eye pain. EARS, NOSE, THROAT: Denies tinnitus, ear pain, hearing loss. RESPIRATORY: Denies cough, wheeze, shortness of breath.  CARDIOVASCULAR: Denies chest pain, palpitations, edema.  GASTROINTESTINAL: Denies nausea, vomiting, diarrhea, abdominal pain. Denies bright red blood per rectum. GENITOURINARY: Denies dysuria, hematuria. ENDOCRINE: Denies nocturia or thyroid problems. HEMATOLOGIC AND LYMPHATIC: Denies easy bruising or bleeding. SKIN: Denies rash or lesion. MUSCULOSKELETAL: Denies pain in neck, back, shoulder, knees Right hip pain is manageable NEUROLOGIC: Denies paralysis, paresthesias.  PSYCHIATRIC: Denies anxiety or depressive symptoms.   VITAL SIGNS:  Blood pressure (!) 97/55, pulse 63, temperature 97.8 F (36.6 C), temperature source Oral, resp. rate 18,  height 5' 1"  (1.549 m), weight 78.2 kg, SpO2 95 %.  I/O:    Intake/Output Summary (Last 24 hours) at 02/01/2018 1056 Last data filed at 02/01/2018 0426 Gross per 24 hour  Intake 480 ml  Output 100 ml  Net 380 ml    PHYSICAL EXAMINATION:  GENERAL:  77 y.o.-year-old patient lying in the bed with no acute distress.  EYES: Pupils equal, round, reactive to light and accommodation. No scleral icterus. Extraocular muscles intact.  HEENT: Head atraumatic, normocephalic. Oropharynx and nasopharynx clear.  NECK:  Supple, no jugular venous distention. No thyroid enlargement, no tenderness.  LUNGS: Normal breath sounds bilaterally, no wheezing, rales,rhonchi or crepitation. No use of accessory muscles of respiration.  CARDIOVASCULAR: S1, S2 normal. No murmurs, rubs, or gallops.  ABDOMEN: Soft, non-tender, non-distended. Bowel sounds present.  EXTREMITIES: No pedal edema, cyanosis, or clubbing.  Right hip area is tender NEUROLOGIC: Cranial nerves II through XII are intact. Sensation intact. Gait not checked.  PSYCHIATRIC: The patient is alert and oriented x 3.  SKIN: No obvious rash, lesion, or ulcer.   DATA REVIEW:   CBC Recent Labs  Lab 02/01/18 0508  WBC 3.1*  HGB 11.2*  HCT 30.6*  PLT 48*    Chemistries  Recent Labs  Lab 01/31/18 0650  NA 133*  K 3.9  CL 96*  CO2 30  GLUCOSE 112*  BUN 13  CREATININE 0.83  CALCIUM 9.3    Cardiac Enzymes No results for input(s): TROPONINI in the last 168 hours.  Microbiology Results  Results for orders placed or performed during the hospital encounter of 07/12/17  MRSA PCR Screening     Status: None   Collection Time: 07/12/17  6:12 PM  Result Value Ref Range Status   MRSA by PCR NEGATIVE NEGATIVE Final    Comment:        The GeneXpert MRSA Assay (FDA approved for NASAL specimens only), is one component of a comprehensive MRSA colonization surveillance program. It is not intended to diagnose MRSA infection nor to guide  or monitor treatment for MRSA infections. Performed at [facility], [address] [place], [place] [address]   Body fluid culture     Status: None   Collection Time: 07/13/17  9:53 AM  Result Value Ref Range Status   Specimen Description   Final    ABDOMEN FLUID Performed at [facility], [address], [place], [place] [address]    Special Requests   Final    ABDOMEN FLUID Performed at [facility], [address] [place], [place] [address]    Gram Stain   Final    CYTOSPIN SMEAR WBC PRESENT,BOTH PMN AND MONONUCLEAR NO ORGANISMS SEEN    Culture   Final    NO GROWTH 3 DAYS Performed at [facility],  [address] [place], [place] [address]    Report Status 07/16/2017 FINAL  Final    RADIOLOGY:  Dg Chest 1 View  Result Date: 01/30/2018 CLINICAL DATA:  77 year old female with fall and pain. EXAM: CHEST  1 VIEW COMPARISON:  Chest radiograph dated 07/12/2017 FINDINGS: The lungs are clear. There is no pleural effusion or pneumothorax. The cardiac silhouette is within normal limits. Probable small right hilar granuloma. A 17 mm apparent nodular density in the right suprahilar region may be artifactual or represent a mildly enlarged lymph node or partially calcified granuloma. Clinical correlation and attention follow-up imaging recommended. Chest CT may provide better evaluation if clinically indicated. Partially visualized heterogeneous area in the proximal left humerus not well evaluated. Further evaluation with dedicated radiograph on a nonemergent basis recommended. IMPRESSION: No acute cardiopulmonary process. Electronically Signed   By: Anner Crete M.D.   On: 01/30/2018 22:01   Dg Hip Unilat W Or Wo Pelvis 2-3 Views Right  Result Date: 01/30/2018 CLINICAL DATA:  Fall with right hip pain EXAM: DG HIP (WITH OR WITHOUT PELVIS) 2-3V RIGHT COMPARISON:  08/09/2017 CT abdomen/pelvis FINDINGS: Slight cortical discontinuity in the superior  right pubic ramus compatible with nondisplaced fracture. Minimal cortical irregularity in the inferior pubic ramus, new, compatible with nondisplaced fracture. No additional fractures. No right hip dislocation. No suspicious focal osseous lesion. Mild osteoarthritis in the weight-bearing portion of the right hip joint. Degenerative changes in the visualized lower lumbar spine. IMPRESSION: Nondisplaced superior and inferior right pubic ramus fractures. Electronically Signed   By: Ilona Sorrel M.D.   On: 01/30/2018 22:00    EKG:   Orders placed or performed during the hospital encounter of 07/12/17  . EKG 12-Lead  . EKG 12-Lead  . EKG      Management plans discussed with the patient, family and they are in agreement.  CODE STATUS:     Code Status Orders  (From admission, onward)         Start     Ordered   01/31/18 0205  Full code  Continuous     01/31/18 0204        Code Status History    Date Active Date Inactive Code Status Order ID Comments User Context   07/12/2017 1333 07/15/2017 1616 Full Code [id number]  Epifanio Lesches, MD ED      TOTAL TIME TAKING CARE OF THIS PATIENT: 42  minutes.   Note: This dictation was prepared with Dragon dictation along with smaller phrase technology. Any transcriptional errors that result from this process are unintentional.   @[facility] @  on 02/01/2018 at 10:56 AM  Between 7am to 6pm - Pager - [phone]  After 6pm go to [email or web address] - password [email or web address] [facility]  Office  [phone]  CC: Primary care physician; Margo Common, PA

## 2018-02-01 NOTE — Progress Notes (Signed)
Physical Therapy Treatment Patient Details Name: Jennifer [name] MRN: [id number] DOB: 05/28/1940 Today's Date: 02/01/2018    History of Present Illness Pt is a 77 y.o. female with a known history of hyperlipidemia, fatty liver and thrombocytopenia. Patient presented to the hospital for right hip pain, status post mechanical fall.  Patient was found with nondisplaced superior and inferior right pubic ramus fractures.     PT Comments    Patient very pleasant and motivated to participate with session.  Able to initiate very short-distance gait training with RW, min assist +1-2, but requires constant, step by step cuing for sequencing and techniques to offload R LE.  Limited by pain; encouraged to take pain medicine as available (per schedule) to allow for optimal participation with subsequent sessions.  Patient voiced understanding.     Follow Up Recommendations  SNF     Equipment Recommendations  None recommended by PT    Recommendations for Other Services       Precautions / Restrictions Precautions Precautions: Fall Restrictions Weight Bearing Restrictions: No Other Position/Activity Restrictions: No WB restrictions noted in chart review, RLE WBAT     Mobility  Bed Mobility Overal bed mobility: Needs Assistance Bed Mobility: Supine to Sit     Supine to sit: Min assist;Mod assist     General bed mobility comments: assist to support/position R LE with movement transition  Transfers Overall transfer level: Needs assistance Equipment used: Rolling walker (2 wheeled) Transfers: Sit to/from Stand Sit to Stand: Min assist         General transfer comment: cuing for hand placement  Ambulation/Gait Ambulation/Gait assistance: Min assist Gait Distance (Feet): (2' x1, 6' x1) Assistive device: Rolling walker (2 wheeled)       General Gait Details: step by step cuing to sequence gait progression and to offload R LE during stance phase; very slow and deliberate; limited  by pain.  Unsafe to attempt without RW and +1-2 for safety   Stairs             Wheelchair Mobility    Modified Rankin (Stroke Patients Only)       Balance Overall balance assessment: Needs assistance Sitting-balance support: No upper extremity supported;Feet supported Sitting balance-Leahy Scale: Good     Standing balance support: Bilateral upper extremity supported Standing balance-Leahy Scale: Fair                              Cognition Arousal/Alertness: Awake/alert Behavior During Therapy: WFL for tasks assessed/performed(anxious/fearful of pain) Overall Cognitive Status: Within Functional Limits for tasks assessed                                        Exercises Other Exercises Other Exercises: Sit/stand x4 with RW, min assist-cuing for hand placement, weight shift and continuous movement as able (for optimal fluidity, pain control)    General Comments        Pertinent Vitals/Pain Pain Assessment: 0-10 Pain Score: 5  Pain Location: R hip Pain Descriptors / Indicators: Aching;Guarding;Grimacing Pain Intervention(s): Limited activity within patient's tolerance;Monitored during session;Patient requesting pain meds-RN notified;RN gave pain meds during session;Repositioned    Home Living                      Prior Function  PT Goals (current goals can now be found in the care plan section) Acute Rehab PT Goals Patient Stated Goal: get out of bed and not use bed pan PT Goal Formulation: With patient Time For Goal Achievement: 02/13/18 Potential to Achieve Goals: Good Progress towards PT goals: Progressing toward goals    Frequency    7X/week      PT Plan Current plan remains appropriate    Co-evaluation              AM-PAC PT "6 Clicks" Daily Activity  Outcome Measure  Difficulty turning over in bed (including adjusting bedclothes, sheets and blankets)?: Unable Difficulty moving from  lying on back to sitting on the side of the bed? : Unable Difficulty sitting down on and standing up from a chair with arms (e.g., wheelchair, bedside commode, etc,.)?: Unable Help needed moving to and from a bed to chair (including a wheelchair)?: A Lot Help needed walking in hospital room?: Total Help needed climbing 3-5 steps with a railing? : Total 6 Click Score: 7    End of Session Equipment Utilized During Treatment: Gait belt Activity Tolerance: Patient tolerated treatment well;Patient limited by pain Patient left: in chair;with call bell/phone within reach(alarm pad not available; RN informed/aware) Nurse Communication: Mobility status PT Visit Diagnosis: Muscle weakness (generalized) (M62.81);Difficulty in walking, not elsewhere classified (R26.2)     Time: 7622-6333 PT Time Calculation (min) (ACUTE ONLY): 36 min  Charges:  $Gait Training: 8-22 mins $Therapeutic Activity: 8-22 mins                     Gwendolyn Nishi H. Owens Shark, PT, DPT, NCS 02/01/18, 3:49 PM [phone]

## 2018-02-01 NOTE — Progress Notes (Signed)
Pt ready for discharge to SNF per MD. Pt assessment unchanged from this morning. Report called to Rocky Link at [facility]; all questions answered and discharge instructions reviewed. EMS transportation set up for pt by RN. PIV removed, VSS. Pt belongings packed.   Jennifer [name]

## 2018-02-01 NOTE — Clinical Social Work Placement (Signed)
   CLINICAL SOCIAL WORK PLACEMENT  NOTE  Date:  02/01/2018  Patient Details  Name: Jennifer [name] MRN: [id number] Date of Birth: [birth year]  Clinical Social Work is seeking post-discharge placement for this patient at the [place] level of care (*CSW will initial, date and re-position this form in  chart as items are completed):  Yes   Patient/family provided with [facility] Work Department's list of facilities offering this level of care within the geographic area requested by the patient (or if unable, by the patient's family).  Yes   Patient/family informed of their freedom to choose among providers that offer the needed level of care, that participate in Medicare, Medicaid or managed care program needed by the patient, have an available bed and are willing to accept the patient.  Yes   Patient/family informed of [facility]'s ownership interest in [facility] and [facility], as well as of the fact that they are under no obligation to receive care at these facilities.  PASRR submitted to EDS on 01/31/18     PASRR number received on 01/31/18     Existing PASRR number confirmed on       FL2 transmitted to all facilities in geographic area requested by pt/family on 01/31/18     FL2 transmitted to all facilities within larger geographic area on       Patient informed that his/her managed care company has contracts with or will negotiate with certain facilities, including the following:        Yes   Patient/family informed of bed offers received.  Patient chooses bed at [facility] )     Physician recommends and patient chooses bed at      Patient to be transferred to [facility] ) on 02/01/18.  Patient to be transferred to facility by [facility] )     Patient family notified on 02/01/18 of transfer.  Name of family member notified:  (CSW left patient's son [name] a Advertising account executive. )     PHYSICIAN       Additional  Comment:    _______________________________________________ Opaline Reyburn, Veronia Beets, LCSW 02/01/2018, 4:30 PM

## 2018-02-01 NOTE — Care Management CC44 (Signed)
Condition Code 44 Documentation Completed  Patient Details  Name: Jennifer [name] MRN: [id number] Date of Birth: [birth year]   Condition Code 44 given:  Yes Patient signature on Condition Code 44 notice:  Yes Documentation of 2 MD's agreement:  Yes Code 44 added to claim:  Yes    Jolly Mango, RN 02/01/2018, 12:45 PM

## 2018-02-04 ENCOUNTER — Non-Acute Institutional Stay (SKILLED_NURSING_FACILITY): Payer: Medicare HMO | Admitting: Adult Health

## 2018-02-04 ENCOUNTER — Encounter: Payer: Self-pay | Admitting: Adult Health

## 2018-02-04 DIAGNOSIS — K7460 Unspecified cirrhosis of liver: Secondary | ICD-10-CM | POA: Diagnosis not present

## 2018-02-04 DIAGNOSIS — D649 Anemia, unspecified: Secondary | ICD-10-CM | POA: Diagnosis not present

## 2018-02-04 DIAGNOSIS — E559 Vitamin D deficiency, unspecified: Secondary | ICD-10-CM

## 2018-02-04 DIAGNOSIS — S32591D Other specified fracture of right pubis, subsequent encounter for fracture with routine healing: Secondary | ICD-10-CM | POA: Insufficient documentation

## 2018-02-04 DIAGNOSIS — M8000XA Age-related osteoporosis with current pathological fracture, unspecified site, initial encounter for fracture: Secondary | ICD-10-CM | POA: Diagnosis not present

## 2018-02-04 DIAGNOSIS — E119 Type 2 diabetes mellitus without complications: Secondary | ICD-10-CM | POA: Diagnosis not present

## 2018-02-04 DIAGNOSIS — J4520 Mild intermittent asthma, uncomplicated: Secondary | ICD-10-CM | POA: Diagnosis not present

## 2018-02-04 DIAGNOSIS — K7581 Nonalcoholic steatohepatitis (NASH): Secondary | ICD-10-CM

## 2018-02-04 DIAGNOSIS — E669 Obesity, unspecified: Secondary | ICD-10-CM | POA: Diagnosis not present

## 2018-02-04 DIAGNOSIS — E611 Iron deficiency: Secondary | ICD-10-CM | POA: Diagnosis not present

## 2018-02-04 DIAGNOSIS — S32501S Unspecified fracture of right pubis, sequela: Secondary | ICD-10-CM | POA: Diagnosis not present

## 2018-02-04 DIAGNOSIS — I10 Essential (primary) hypertension: Secondary | ICD-10-CM | POA: Diagnosis not present

## 2018-02-04 DIAGNOSIS — D696 Thrombocytopenia, unspecified: Secondary | ICD-10-CM

## 2018-02-04 NOTE — Progress Notes (Signed)
Location:   [facility] Room Number: [id number] Place of Service:  SNF (31)   CODE STATUS: Full Code  Allergies  Allergen Reactions  . Codeine Other (See Comments)    Pt denies  . Hydrocodone-Acetaminophen Other (See Comments)    Pt denies  . Nitrofurantoin Other (See Comments)  . Nitrofurantoin Monohyd Macro     "Sugar got high"  . Penicillins     Has patient had a PCN reaction causing immediate rash, facial/tongue/throat swelling, SOB or lightheadedness with hypotension: Unknown Has patient had a PCN reaction causing severe rash involving mucus membranes or skin necrosis: Unknown Has patient had a PCN reaction that required hospitalization: Unknown Has patient had a PCN reaction occurring within the last 10 years: Unknown If all of the above answers are "NO", then may proceed with Cephalosporin use.  . Sulfa Antibiotics Other (See Comments)    Chief Complaint  Patient presents with  . Hospitalization Follow-up    Hospital follow up    HPI:  She is a 77 year old woman who sustained a right pubic ramus fracture after a mechanical fall. She denies any uncontrolled pain. She denies any changes in appetite; no insomnia present. Her goal is to return back home. She has 3 steps to get into her home will have ramp. She has a BSC but will need a shower chair. She will continue to be followed for her chronic illnesses including: vit d def; iron def; hypertension.   Past Medical History:  Diagnosis Date  . Allergy   . Arthritis   . Asthma   . Colon polyp   . Fatty liver   . GERD (gastroesophageal reflux disease)   . Hyperlipidemia   . Hypertension   . Motion sickness    boats    Past Surgical History:  Procedure Laterality Date  . ABDOMINAL HYSTERECTOMY  1978  . COLONOSCOPY WITH PROPOFOL N/A 02/01/2015   Procedure: COLONOSCOPY WITH PROPOFOL;  Surgeon: Lucilla Lame, MD;  Location: [place];  Service: Endoscopy;  Laterality: N/A;  Diabetic -  oral meds  . COLONOSCOPY WITH PROPOFOL N/A 11/22/2017   Procedure: COLONOSCOPY WITH PROPOFOL;  Surgeon: Lin Landsman, MD;  Location: [facility] ENDOSCOPY;  Service: Gastroenterology;  Laterality: N/A;  . ESOPHAGOGASTRODUODENOSCOPY (EGD) WITH PROPOFOL N/A 07/12/2017   Procedure: ESOPHAGOGASTRODUODENOSCOPY (EGD) WITH PROPOFOL;  Surgeon: Lin Landsman, MD;  Location: [facility] ENDOSCOPY;  Service: Gastroenterology;  Laterality: N/A;  . ESOPHAGOGASTRODUODENOSCOPY (EGD) WITH PROPOFOL N/A 08/16/2017   Procedure: ESOPHAGOGASTRODUODENOSCOPY (EGD) WITH PROPOFOL;  Surgeon: Lin Landsman, MD;  Location: [facility] ENDOSCOPY;  Service: Gastroenterology;  Laterality: N/A;  . ESOPHAGOGASTRODUODENOSCOPY (EGD) WITH PROPOFOL N/A 09/20/2017   Procedure: ESOPHAGOGASTRODUODENOSCOPY (EGD) WITH PROPOFOL;  Surgeon: Lin Landsman, MD;  Location: [facility] ENDOSCOPY;  Service: Gastroenterology;  Laterality: N/A;  . ESOPHAGOGASTRODUODENOSCOPY (EGD) WITH PROPOFOL N/A 11/01/2017   Procedure: ESOPHAGOGASTRODUODENOSCOPY (EGD) WITH PROPOFOL;  Surgeon: Lin Landsman, MD;  Location: [facility] ENDOSCOPY;  Service: Gastroenterology;  Laterality: N/A;  . ESOPHAGOGASTRODUODENOSCOPY (EGD) WITH PROPOFOL N/A 01/17/2018   Procedure: ESOPHAGOGASTRODUODENOSCOPY (EGD) WITH PROPOFOL;  Surgeon: Lin Landsman, MD;  Location: [facility] ENDOSCOPY;  Service: Gastroenterology;  Laterality: N/A;  . POLYPECTOMY  02/01/2015   Procedure: POLYPECTOMY;  Surgeon: Lucilla Lame, MD;  Location: [place];  Service: Endoscopy;;  . TONSILLECTOMY AND ADENOIDECTOMY      Social History   Socioeconomic History  . Marital status: Widowed    Spouse name: Not on file  . Number of children:  2  . Years of education: Not on file  . Highest education level: Not on file  Occupational History  . Occupation: retired  Scientific laboratory technician  . Financial resource strain: Not hard at all  . Food insecurity:    Worry: Never true    Inability: Never true  .  Transportation needs:    Medical: No    Non-medical: No  Tobacco Use  . Smoking status: Former Smoker    Packs/day: 0.50    Years: 6.00    Pack years: 3.00    Types: Cigarettes    Last attempt to quit: 05/08/1987    Years since quitting: 30.7  . Smokeless tobacco: Never Used  Substance and Sexual Activity  . Alcohol use: Never    Frequency: Never  . Drug use: Never  . Sexual activity: Not on file  Lifestyle  . Physical activity:    Days per week: Not on file    Minutes per session: Not on file  . Stress: Not at all  Relationships  . Social connections:    Talks on phone: Not on file    Gets together: Not on file    Attends religious service: Not on file    Active member of club or organization: Not on file    Attends meetings of clubs or organizations: Not on file    Relationship status: Not on file  . Intimate partner violence:    Fear of current or ex partner: Not on file    Emotionally abused: Not on file    Physically abused: Not on file    Forced sexual activity: Not on file  Other Topics Concern  . Not on file  Social History Narrative  . Not on file   Family History  Problem Relation Age of Onset  . Dementia Mother   . Alcohol abuse Father   . Cancer Sister        lung  . Lymphoma Sister   . Lymphoma Sister       VITAL SIGNS BP (!) 100/52   Pulse (!) 58   Temp 98.4 F (36.9 C)   Resp 18   Ht 5' 1"  (1.549 m)   Wt 172 lb 6 oz (78.2 kg)   SpO2 96%   BMI 32.57 kg/m   Outpatient Encounter Medications as of 02/04/2018  Medication Sig  . albuterol (PROVENTIL HFA;VENTOLIN HFA) 108 (90 Base) MCG/ACT inhaler Inhale 2 puffs into the lungs every 6 (six) hours as needed for wheezing or shortness of breath.  . docusate sodium (COLACE) 100 MG capsule Take 1 capsule (100 mg total) by mouth 2 (two) times daily as needed for mild constipation or moderate constipation.  . ergocalciferol (VITAMIN D2) 50000 units capsule Take 50,000 Units by mouth once a week.  Every Friday  . IRON PO Take 45 mg by mouth once a week. Every Monday  . magnesium oxide (MAG-OX) 400 MG tablet Take 400 mg by mouth 2 (two) times daily.  . nadolol (CORGARD) 20 MG tablet Take 20 mg by mouth daily.  . Nutritional Supplements (NUTRITIONAL SUPPLEMENT PO) Diet Type: NAS (no added salt) diet, NCS  . traMADol (ULTRAM) 50 MG tablet Take 1 tablet (50 mg total) by mouth every 6 (six) hours as needed for moderate pain.   No facility-administered encounter medications on file as of 02/04/2018.      SIGNIFICANT DIAGNOSTIC EXAMS  TODAY:   01-30-18: hip and pelvic x-ray: Nondisplaced superior and inferior right pubic ramus fractures.  LABS REVIEWED TODAY:   01-30-18: wbc 4.0; hgb 12.5 hct 34.6; mcv 90.4 plt 59; glucose 118; bun 14; creat 0.86; k+ 4.5;na++ 131; ca 9.5 02-01-18: wbc 3.1; hgb 11.2; hct 30.6; mcv 90.1; plt 48   Review of Systems  Constitutional: Negative for malaise/fatigue.  Respiratory: Negative for cough and shortness of breath.   Cardiovascular: Negative for chest pain, palpitations and leg swelling.  Gastrointestinal: Negative for abdominal pain, constipation and heartburn.  Musculoskeletal: Negative for back pain, joint pain and myalgias.  Skin: Negative.   Neurological: Negative for dizziness.  Psychiatric/Behavioral: The patient is not nervous/anxious.     Physical Exam  Constitutional: She is oriented to person, place, and time. She appears well-developed and well-nourished. No distress.  Neck: No thyromegaly present.  Cardiovascular: Normal rate, regular rhythm, normal heart sounds and intact distal pulses.  Pulmonary/Chest: Effort normal and breath sounds normal. No respiratory distress.  Abdominal: Soft. Bowel sounds are normal. She exhibits no distension. There is no tenderness.  Musculoskeletal: She exhibits no edema.  Is able to move all extremities Is status post right pubic ramus fracture.   Lymphadenopathy:    She has no cervical  adenopathy.  Neurological: She is alert and oriented to person, place, and time.  Skin: Skin is warm and dry. She is not diaphoretic.  Psychiatric: She has a normal mood and affect.    ASSESSMENT/ PLAN:  TODAY:   1. Essential hypertension; benign: is stable b/p 100/52 will continue nadolol 20 mg daily   2. Vit D deficiency: is stable will continue vit D 50,000 units weekly  3. Thrombocytopenia: is stable plt 48,000  4. Closed fracture of right pubis, unspecified portion of pubis sequela: is stable will continue therapy as directed and will follow up with orthopedics. Will continue ultram 50 mg every 6 hours as needed   5. Iron deficiency: hgb stable 11.2 will continue iron weekly   6. Liver cirrhosis secondary to NASH: is stable will monitor      MD is aware of resident's narcotic use and is in agreement with current plan of care. We will attempt to wean resident as apropriate   Ok Edwards NP [facility] Adult Medicine  Contact [phone] Monday through Friday 8am- 5pm  After hours call [phone]

## 2018-02-05 ENCOUNTER — Encounter
Admission: RE | Admit: 2018-02-05 | Discharge: 2018-02-05 | Disposition: A | Discharge status: Home / Self Care | Payer: Medicare HMO | Source: Ambulatory Visit | Attending: Internal Medicine | Admitting: Internal Medicine

## 2018-02-07 ENCOUNTER — Other Ambulatory Visit: Payer: Self-pay | Admitting: Gastroenterology

## 2018-02-08 ENCOUNTER — Other Ambulatory Visit
Admission: RE | Admit: 2018-02-08 | Discharge: 2018-02-08 | Disposition: A | Discharge status: Home / Self Care | Payer: Medicare HMO | Source: Ambulatory Visit | Attending: Adult Health | Admitting: Adult Health

## 2018-02-08 DIAGNOSIS — D696 Thrombocytopenia, unspecified: Secondary | ICD-10-CM | POA: Insufficient documentation

## 2018-02-08 DIAGNOSIS — E559 Vitamin D deficiency, unspecified: Secondary | ICD-10-CM | POA: Insufficient documentation

## 2018-02-08 LAB — CBC WITH DIFFERENTIAL/PLATELET
BASOS PCT: 1 %
Basophils Absolute: 0 10*3/uL (ref 0–0.1)
EOS PCT: 2 %
Eosinophils Absolute: 0.1 10*3/uL (ref 0–0.7)
HCT: 30.3 % — ABNORMAL LOW (ref 35.0–47.0)
Hemoglobin: 11.1 g/dL — ABNORMAL LOW (ref 12.0–16.0)
LYMPHS PCT: 27 %
Lymphs Abs: 0.7 10*3/uL — ABNORMAL LOW (ref 1.0–3.6)
MCH: 33.1 pg (ref 26.0–34.0)
MCHC: 36.6 g/dL — ABNORMAL HIGH (ref 32.0–36.0)
MCV: 90.5 fL (ref 80.0–100.0)
Monocytes Absolute: 0.3 10*3/uL (ref 0.2–0.9)
Monocytes Relative: 11 %
NEUTROS ABS: 1.5 10*3/uL (ref 1.4–6.5)
Neutrophils Relative %: 59 %
PLATELETS: 68 10*3/uL — AB (ref 150–440)
RBC: 3.35 MIL/uL — AB (ref 3.80–5.20)
RDW: 13.4 % (ref 11.5–14.5)
WBC: 2.6 10*3/uL — AB (ref 3.6–11.0)

## 2018-02-09 LAB — VITAMIN D 25 HYDROXY (VIT D DEFICIENCY, FRACTURES): Vit D, 25-Hydroxy: 39.9 ng/mL (ref 30.0–100.0)

## 2018-02-12 ENCOUNTER — Ambulatory Visit: Payer: Medicare HMO | Admitting: Gastroenterology

## 2018-02-12 ENCOUNTER — Encounter: Payer: Self-pay | Admitting: *Deleted

## 2018-02-13 ENCOUNTER — Encounter: Payer: Self-pay | Admitting: Adult Health

## 2018-02-13 ENCOUNTER — Non-Acute Institutional Stay (SKILLED_NURSING_FACILITY): Payer: Medicare HMO | Admitting: Adult Health

## 2018-02-13 DIAGNOSIS — E611 Iron deficiency: Secondary | ICD-10-CM | POA: Diagnosis not present

## 2018-02-13 DIAGNOSIS — S32501S Unspecified fracture of right pubis, sequela: Secondary | ICD-10-CM | POA: Diagnosis not present

## 2018-02-13 DIAGNOSIS — R600 Localized edema: Secondary | ICD-10-CM | POA: Diagnosis not present

## 2018-02-13 DIAGNOSIS — I10 Essential (primary) hypertension: Secondary | ICD-10-CM | POA: Diagnosis not present

## 2018-02-13 NOTE — Progress Notes (Signed)
[facility]:  [facility] [place] Number: [id number] Place of Service:  SNF (31) Provider:  Durenda Age, NP  Patient Care Team: Margo Common, PA as PCP - General (Family Medicine) Bary Castilla Forest Gleason, MD (General Surgery) Anell Barr, OD as Consulting Physician (Optometry) Cammie Sickle, MD as Consulting Physician (Internal Medicine) Lin Landsman, MD as Consulting Physician (Gastroenterology)  Extended Emergency Contact Information Primary Emergency Contact: Drue Dun [place] Mobile Phone: [phone] Relation: Son Secondary Emergency Contact: Ollie Mobile Phone: [phone] Relation: Daughter  Code Status:  Full Code  Goals of care: Advanced Directive information Advanced Directives 02/04/2018  Does Patient Have a Medical Advance Directive? No  Type of Advance Directive -  Does patient want to make changes to medical advance directive? -  Would patient like information on creating a medical advance directive? No - Patient declined     Chief Complaint  Patient presents with  . Medical Management of Chronic Issues    Routine [facility] visit    HPI:  Pt is a 77 y.o. female seen today for medical management of chronic diseases. She is a short-term rehabilitation resident at [facility].  She has a PMH of hypertension, vitamin D and iron deficiency, asthma, and hyperlipidemia. She was noted to have LLE 1+ edema. She verbalized that she used to be on diuretic at home. BPs are stable -124/49, 111/53, 123/49, 107/48, 123/64.  Son at bedside.  She has been admitted to [place] on 02/01/18 from a recent hospitalization for acute pelvic fracture. She had a fall and was having right hip pain. X-ray showed nondisplaced superior and inferior right pubic ramus fractures Orthopedics recommended pain management and no surgery. Aldactone and Lasix were held due to soft blood  pressure.   Past Medical History:  Diagnosis Date  . Allergy   . Arthritis   . Asthma   . Colon polyp   . Fatty liver   . GERD (gastroesophageal reflux disease)   . Hyperlipidemia   . Hypertension   . Motion sickness    boats   Past Surgical History:  Procedure Laterality Date  . ABDOMINAL HYSTERECTOMY  1978  . COLONOSCOPY WITH PROPOFOL N/A 02/01/2015   Procedure: COLONOSCOPY WITH PROPOFOL;  Surgeon: Lucilla Lame, MD;  Location: [place];  Service: Endoscopy;  Laterality: N/A;  Diabetic - oral meds  . COLONOSCOPY WITH PROPOFOL N/A 11/22/2017   Procedure: COLONOSCOPY WITH PROPOFOL;  Surgeon: Lin Landsman, MD;  Location: [facility] ENDOSCOPY;  Service: Gastroenterology;  Laterality: N/A;  . ESOPHAGOGASTRODUODENOSCOPY (EGD) WITH PROPOFOL N/A 07/12/2017   Procedure: ESOPHAGOGASTRODUODENOSCOPY (EGD) WITH PROPOFOL;  Surgeon: Lin Landsman, MD;  Location: [facility] ENDOSCOPY;  Service: Gastroenterology;  Laterality: N/A;  . ESOPHAGOGASTRODUODENOSCOPY (EGD) WITH PROPOFOL N/A 08/16/2017   Procedure: ESOPHAGOGASTRODUODENOSCOPY (EGD) WITH PROPOFOL;  Surgeon: Lin Landsman, MD;  Location: [facility] ENDOSCOPY;  Service: Gastroenterology;  Laterality: N/A;  . ESOPHAGOGASTRODUODENOSCOPY (EGD) WITH PROPOFOL N/A 09/20/2017   Procedure: ESOPHAGOGASTRODUODENOSCOPY (EGD) WITH PROPOFOL;  Surgeon: Lin Landsman, MD;  Location: [facility] ENDOSCOPY;  Service: Gastroenterology;  Laterality: N/A;  . ESOPHAGOGASTRODUODENOSCOPY (EGD) WITH PROPOFOL N/A 11/01/2017   Procedure: ESOPHAGOGASTRODUODENOSCOPY (EGD) WITH PROPOFOL;  Surgeon: Lin Landsman, MD;  Location: [facility] ENDOSCOPY;  Service: Gastroenterology;  Laterality: N/A;  . ESOPHAGOGASTRODUODENOSCOPY (EGD) WITH PROPOFOL N/A 01/17/2018   Procedure: ESOPHAGOGASTRODUODENOSCOPY (EGD) WITH PROPOFOL;  Surgeon: Lin Landsman, MD;  Location: [facility] ENDOSCOPY;  Service: Gastroenterology;  Laterality: N/A;  . POLYPECTOMY  02/01/2015   Procedure:  POLYPECTOMY;  Surgeon: Lucilla Lame, MD;  Location: [place];  Service: Endoscopy;;  . TONSILLECTOMY AND ADENOIDECTOMY      Allergies  Allergen Reactions  . Codeine Other (See Comments)    Pt denies  . Hydrocodone-Acetaminophen Other (See Comments)    Pt denies  . Nitrofurantoin Other (See Comments)  . Nitrofurantoin Monohyd Macro     "Sugar got high"  . Penicillins     Has patient had a PCN reaction causing immediate rash, facial/tongue/throat swelling, SOB or lightheadedness with hypotension: Unknown Has patient had a PCN reaction causing severe rash involving mucus membranes or skin necrosis: Unknown Has patient had a PCN reaction that required hospitalization: Unknown Has patient had a PCN reaction occurring within the last 10 years: Unknown If all of the above answers are "NO", then may proceed with Cephalosporin use.  . Sulfa Antibiotics Other (See Comments)    Outpatient Encounter Medications as of 02/13/2018  Medication Sig  . albuterol (PROVENTIL HFA;VENTOLIN HFA) 108 (90 Base) MCG/ACT inhaler Inhale 2 puffs into the lungs every 6 (six) hours as needed for wheezing or shortness of breath.  . docusate sodium (COLACE) 100 MG capsule Take 1 capsule (100 mg total) by mouth 2 (two) times daily as needed for mild constipation or moderate constipation.  . ergocalciferol (VITAMIN D2) 50000 units capsule Take 50,000 Units by mouth once a week. Every Friday  . IRON PO Take 45 mg by mouth once a week. Every Monday  . magnesium oxide (MAG-OX) 400 MG tablet Take 400 mg by mouth 2 (two) times daily.  . nadolol (CORGARD) 20 MG tablet Take 20 mg by mouth daily.  . Nutritional Supplements (NUTRITIONAL SUPPLEMENT PO) Diet Type: NAS (no added salt) diet, NCS  . traMADol (ULTRAM) 50 MG tablet Take 1 tablet (50 mg total) by mouth every 6 (six) hours as needed for moderate pain.   No facility-administered encounter medications on file as of 02/13/2018.     Review of Systems  GENERAL:  No change in appetite, no fatigue, no weight changes, no fever, chills or weakness MOUTH and THROAT: Denies oral discomfort, gingival pain or bleeding, pain from teeth or hoarseness   RESPIRATORY: no cough, SOB, DOE, wheezing, hemoptysis CARDIAC: No chest pain, or palpitations GI: No abdominal pain, diarrhea, constipation, heart burn, nausea or vomiting PSYCHIATRIC: Denies feelings of depression or anxiety. No report of hallucinations, insomnia, paranoia, or agitation    Immunization History  Administered Date(s) Administered  . Hepatitis A 07/10/2011, 12/25/2011  . Hepatitis B 07/10/2011, 08/11/2011, 12/25/2011  . Influenza Split 02/24/2010, 03/27/2012  . Influenza, High Dose Seasonal PF 01/18/2015, 02/19/2016, 02/05/2017  . Pneumococcal Conjugate-13 07/13/2014  . Pneumococcal Polysaccharide-23 01/27/2009  . Td 04/08/1996  . Tdap 02/24/2010   Pertinent  Health Maintenance Due  Topic Date Due  . INFLUENZA VACCINE  04/05/2018 (Originally 12/06/2017)  . HEMOGLOBIN A1C  03/01/2018  . OPHTHALMOLOGY EXAM  03/27/2018  . FOOT EXAM  08/31/2018  . URINE MICROALBUMIN  08/31/2018  . COLONOSCOPY  11/22/2020  . DEXA SCAN  Completed  . PNA vac Low Risk Adult  Completed   Fall Risk  08/23/2017 07/27/2016 07/11/2016 01/18/2015 10/14/2014  Falls in the past year? No No No No No     Vitals:   02/13/18 1326  BP: (!) 111/53  Pulse: 68  Resp: 18  Temp: 98 F (36.7 C)  TempSrc: Oral  SpO2: 97%  Weight: 173 lb 6.4 oz (78.7 kg)  Height: 5'  1" (1.549 m)   Body mass index is 32.76 kg/m.  Physical Exam  GENERAL APPEARANCE: Well nourished. In no acute distress. Obese SKIN:  Skin is warm and dry.  MOUTH and THROAT: Lips are without lesions. Oral mucosa is moist and without lesions. Tongue is normal in shape, size, and color and without lesions RESPIRATORY: Breathing is even & unlabored, BS CTAB CARDIAC: RRR, no murmur,no extra heart sounds, LLE 1+ edema GI: Abdomen soft, normal BS, no masses, no  tenderness EXTREMITIES:  Able to move X 4 extremities NEUROLOGICAL: There is no tremor. Speech is clear PSYCHIATRIC: Alert and oriented X 3. Affect and behavior are appropriate  Labs reviewed: Recent Labs    07/13/17 0020 07/15/17 0421  08/16/17 0950  01/16/18 1016 01/30/18 2344 01/31/18 0650  NA 137 141   < > 136   < > 127* 131* 133*  K 4.2 3.7   < > 3.9   < > 4.8 4.5 3.9  CL 107 108   < > 100*   < > 92* 93* 96*  CO2 22 28   < > 30   < > 29 30 30   GLUCOSE 183* 104*   < > 119*   < > 166* 118* 112*  BUN 23* 20   < > 11   < > 18 14 13   CREATININE 0.72 0.73   < > 0.78   < > 1.05* 0.86 0.83  CALCIUM 7.8* 7.8*   < > 9.1   < > 9.2 9.5 9.3  MG 1.5* 1.8  --  1.5*  --   --   --   --   PHOS 3.2  --   --   --   --   --   --   --    < > = values in this interval not displayed.   Recent Labs    08/30/17 1008 11/16/17 1049 01/16/18 1016  AST 42* 40 68*  ALT 15 21 45*  ALKPHOS 70 57 60  BILITOT 0.8 1.3* 1.1  PROT 6.8 7.3 6.6  ALBUMIN 3.7 4.0 3.7   Recent Labs    11/16/17 1049 01/16/18 1016  01/31/18 0650 02/01/18 0508 02/08/18 0523  WBC 3.4* 2.9*   < > 2.5* 3.1* 2.6*  NEUTROABS 2.2 1.9  --   --   --  1.5  HGB 12.8 12.0   < > 11.5* 11.2* 11.1*  HCT 36.4 34.5*   < > 31.6* 30.6* 30.3*  MCV 89.6 91.1   < > 89.5 90.1 90.5  PLT 78* 63*   < > 46* 48* 68*   < > = values in this interval not displayed.   Lab Results  Component Value Date   TSH 2.030 08/30/2017   Lab Results  Component Value Date   HGBA1C 6.0 08/30/2017   Lab Results  Component Value Date   CHOL 195 08/30/2017   HDL 46 08/30/2017   LDLCALC 132 (H) 08/30/2017   TRIG 85 08/30/2017   CHOLHDL 4.2 08/30/2017    Significant Diagnostic Results in last 30 days:  Dg Chest 1 View  Result Date: 01/30/2018 CLINICAL DATA:  77 year old female with fall and pain. EXAM: CHEST  1 VIEW COMPARISON:  Chest radiograph dated 07/12/2017 FINDINGS: The lungs are clear. There is no pleural effusion or pneumothorax. The cardiac  silhouette is within normal limits. Probable small right hilar granuloma. A 17 mm apparent nodular density in the right suprahilar region may be artifactual or represent a mildly  enlarged lymph node or partially calcified granuloma. Clinical correlation and attention follow-up imaging recommended. Chest CT may provide better evaluation if clinically indicated. Partially visualized heterogeneous area in the proximal left humerus not well evaluated. Further evaluation with dedicated radiograph on a nonemergent basis recommended. IMPRESSION: No acute cardiopulmonary process. Electronically Signed   By: Anner Crete M.D.   On: 01/30/2018 22:01   Dg Hip Unilat W Or Wo Pelvis 2-3 Views Right  Result Date: 01/30/2018 CLINICAL DATA:  Fall with right hip pain EXAM: DG HIP (WITH OR WITHOUT PELVIS) 2-3V RIGHT COMPARISON:  08/09/2017 CT abdomen/pelvis FINDINGS: Slight cortical discontinuity in the superior right pubic ramus compatible with nondisplaced fracture. Minimal cortical irregularity in the inferior pubic ramus, new, compatible with nondisplaced fracture. No additional fractures. No right hip dislocation. No suspicious focal osseous lesion. Mild osteoarthritis in the weight-bearing portion of the right hip joint. Degenerative changes in the visualized lower lumbar spine. IMPRESSION: Nondisplaced superior and inferior right pubic ramus fractures. Electronically Signed   By: Ilona Sorrel M.D.   On: 01/30/2018 22:00    Assessment/Plan  1. Edema of lower extremity -  will start Lasix 20 mg 1 tab daily   2. Closed fracture of right pubis, unspecified portion of pubis, sequela -continue tramadol 50 mg 1 tab every 6 hours as needed for pain, PT and OT for therapeutic and strengthening exercises, fall precautions   3. Iron deficiency -continue iron ER 159 mg 1 tab daily Lab Results  Component Value Date   HGB 11.1 (L) 02/08/2018    4. Essential hypertension, benign -stable, nadolol will be decreased  from 20 mg to 10 mg daily since she will be restarted on Lasix due to lower extremity edema, BP/HR twice daily x1 week    Family/ staff Communication: Discussed plan of care with resident and son.  Labs/tests ordered: None  Goals of care:   Short-term care   Durenda Age, NP [facility] Adult [facility] [phone] (Monday-Friday 8:00 a.m. - 5:00 p.m.) [phone] (after hours)

## 2018-02-18 ENCOUNTER — Encounter: Payer: Self-pay | Admitting: Adult Health

## 2018-02-18 ENCOUNTER — Telehealth: Payer: Self-pay | Admitting: Gastroenterology

## 2018-02-18 ENCOUNTER — Other Ambulatory Visit: Payer: Self-pay

## 2018-02-18 ENCOUNTER — Non-Acute Institutional Stay (SKILLED_NURSING_FACILITY): Payer: Medicare HMO | Admitting: Adult Health

## 2018-02-18 DIAGNOSIS — S32501S Unspecified fracture of right pubis, sequela: Secondary | ICD-10-CM | POA: Diagnosis not present

## 2018-02-18 DIAGNOSIS — I10 Essential (primary) hypertension: Secondary | ICD-10-CM

## 2018-02-18 DIAGNOSIS — E119 Type 2 diabetes mellitus without complications: Secondary | ICD-10-CM | POA: Diagnosis not present

## 2018-02-18 MED ORDER — TRAMADOL HCL 50 MG PO TABS: 50.0000 mg | ORAL_TABLET | Freq: Four times a day (QID) | ORAL | 0 refills | Status: DC | PRN

## 2018-02-18 NOTE — Telephone Encounter (Signed)
Discharged with patient 

## 2018-02-18 NOTE — Progress Notes (Signed)
Location:   [place] [facility] Room Number: 202 A Place of Service:  SNF (31)    CODE STATUS: Full Code  Allergies  Allergen Reactions  . Codeine Other (See Comments)    Pt denies  . Hydrocodone-Acetaminophen Other (See Comments)    Pt denies  . Nitrofurantoin Other (See Comments)  . Nitrofurantoin Monohyd Macro     "Sugar got high"  . Penicillins     Has patient had a PCN reaction causing immediate rash, facial/tongue/throat swelling, SOB or lightheadedness with hypotension: Unknown Has patient had a PCN reaction causing severe rash involving mucus membranes or skin necrosis: Unknown Has patient had a PCN reaction that required hospitalization: Unknown Has patient had a PCN reaction occurring within the last 10 years: Unknown If all of the above answers are "NO", then may proceed with Cephalosporin use.  . Sulfa Antibiotics Other (See Comments)    Chief Complaint  Patient presents with  . Discharge Note    Discharging to home on 02/20/18    HPI:  She is being discharged to home with home health for pt/ot/rn. She will need a light weight wheelchair. She will need her prescriptions written and will need to follow up with her medical provider. She had been hospitalized after fall suffering a right pubic ramus fracture. She was admitted to this facility for short term rehab and is ready for discharge to home.    Past Medical History:  Diagnosis Date  . Allergy   . Arthritis   . Asthma   . Colon polyp   . Fatty liver   . GERD (gastroesophageal reflux disease)   . Hyperlipidemia   . Hypertension   . Motion sickness    boats    Past Surgical History:  Procedure Laterality Date  . ABDOMINAL HYSTERECTOMY  1978  . COLONOSCOPY WITH PROPOFOL N/A 02/01/2015   Procedure: COLONOSCOPY WITH PROPOFOL;  Surgeon: Lucilla Lame, MD;  Location: [place];  Service: Endoscopy;  Laterality: N/A;  Diabetic - oral meds  . COLONOSCOPY WITH PROPOFOL N/A  11/22/2017   Procedure: COLONOSCOPY WITH PROPOFOL;  Surgeon: Lin Landsman, MD;  Location: [facility] ENDOSCOPY;  Service: Gastroenterology;  Laterality: N/A;  . ESOPHAGOGASTRODUODENOSCOPY (EGD) WITH PROPOFOL N/A 07/12/2017   Procedure: ESOPHAGOGASTRODUODENOSCOPY (EGD) WITH PROPOFOL;  Surgeon: Lin Landsman, MD;  Location: [facility] ENDOSCOPY;  Service: Gastroenterology;  Laterality: N/A;  . ESOPHAGOGASTRODUODENOSCOPY (EGD) WITH PROPOFOL N/A 08/16/2017   Procedure: ESOPHAGOGASTRODUODENOSCOPY (EGD) WITH PROPOFOL;  Surgeon: Lin Landsman, MD;  Location: [facility] ENDOSCOPY;  Service: Gastroenterology;  Laterality: N/A;  . ESOPHAGOGASTRODUODENOSCOPY (EGD) WITH PROPOFOL N/A 09/20/2017   Procedure: ESOPHAGOGASTRODUODENOSCOPY (EGD) WITH PROPOFOL;  Surgeon: Lin Landsman, MD;  Location: [facility] ENDOSCOPY;  Service: Gastroenterology;  Laterality: N/A;  . ESOPHAGOGASTRODUODENOSCOPY (EGD) WITH PROPOFOL N/A 11/01/2017   Procedure: ESOPHAGOGASTRODUODENOSCOPY (EGD) WITH PROPOFOL;  Surgeon: Lin Landsman, MD;  Location: [facility] ENDOSCOPY;  Service: Gastroenterology;  Laterality: N/A;  . ESOPHAGOGASTRODUODENOSCOPY (EGD) WITH PROPOFOL N/A 01/17/2018   Procedure: ESOPHAGOGASTRODUODENOSCOPY (EGD) WITH PROPOFOL;  Surgeon: Lin Landsman, MD;  Location: [facility] ENDOSCOPY;  Service: Gastroenterology;  Laterality: N/A;  . POLYPECTOMY  02/01/2015   Procedure: POLYPECTOMY;  Surgeon: Lucilla Lame, MD;  Location: [place];  Service: Endoscopy;;  . TONSILLECTOMY AND ADENOIDECTOMY      Social History   Socioeconomic History  . Marital status: Widowed    Spouse name: Not on file  . Number of children: 2  . Years of education: Not on file  .  Highest education level: Not on file  Occupational History  . Occupation: retired  Scientific laboratory technician  . Financial resource strain: Not hard at all  . Food insecurity:    Worry: Never true    Inability: Never true  . Transportation needs:    Medical: No    Non-medical:  No  Tobacco Use  . Smoking status: Former Smoker    Packs/day: 0.50    Years: 6.00    Pack years: 3.00    Types: Cigarettes    Last attempt to quit: 05/08/1987    Years since quitting: 30.8  . Smokeless tobacco: Never Used  Substance and Sexual Activity  . Alcohol use: Never    Frequency: Never  . Drug use: Never  . Sexual activity: Not on file  Lifestyle  . Physical activity:    Days per week: Not on file    Minutes per session: Not on file  . Stress: Not at all  Relationships  . Social connections:    Talks on phone: Not on file    Gets together: Not on file    Attends religious service: Not on file    Active member of club or organization: Not on file    Attends meetings of clubs or organizations: Not on file    Relationship status: Not on file  . Intimate partner violence:    Fear of current or ex partner: Not on file    Emotionally abused: Not on file    Physically abused: Not on file    Forced sexual activity: Not on file  Other Topics Concern  . Not on file  Social History Narrative  . Not on file   Family History  Problem Relation Age of Onset  . Dementia Mother   . Alcohol abuse Father   . Cancer Sister        lung  . Lymphoma Sister   . Lymphoma Sister     VITAL SIGNS BP (!) 114/49   Pulse 72   Temp 98.3 F (36.8 C)   Resp (!) 72   Ht 5' 1"  (1.549 m)   Wt 176 lb 6.4 oz (80 kg)   SpO2 100%   BMI 33.33 kg/m   Patient's Medications  New Prescriptions   No medications on file  Previous Medications   ALBUTEROL (PROVENTIL HFA;VENTOLIN HFA) 108 (90 BASE) MCG/ACT INHALER    Inhale 2 puffs into the lungs every 6 (six) hours as needed for wheezing or shortness of breath.   DOCUSATE SODIUM (COLACE) 100 MG CAPSULE    Take 1 capsule (100 mg total) by mouth 2 (two) times daily as needed for mild constipation or moderate constipation.   ERGOCALCIFEROL (VITAMIN D2) 50000 UNITS CAPSULE    Take 50,000 Units by mouth once a week. Every Friday   FUROSEMIDE  (LASIX) 20 MG TABLET    Give 1/2 tablet (62m) by mouth daily   IRON PO    Take 45 mg by mouth once a week. Every Monday   MAGNESIUM OXIDE (MAG-OX) 400 MG TABLET    Take 400 mg by mouth 2 (two) times daily.   NADOLOL (CORGARD) 20 MG TABLET    Take 10 mg by mouth daily.    NUTRITIONAL SUPPLEMENTS (NUTRITIONAL SUPPLEMENT PO)    Diet Type: NAS (no added salt) diet, NCS   TRAMADOL (ULTRAM) 50 MG TABLET    Take 1 tablet (50 mg total) by mouth every 6 (six) hours as needed for moderate pain.  Modified Medications  No medications on file  Discontinued Medications   No medications on file     SIGNIFICANT DIAGNOSTIC EXAMS  PREVIOUS:   01-30-18: hip and pelvic x-ray: Nondisplaced superior and inferior right pubic ramus fractures.  NO NEW EXAMS    LABS REVIEWED PREVIOUS:   01-30-18: wbc 4.0; hgb 12.5 hct 34.6; mcv 90.4 plt 59; glucose 118; bun 14; creat 0.86; k+ 4.5;na++ 131; ca 9.5 02-01-18: wbc 3.1; hgb 11.2; hct 30.6; mcv 90.1; plt 48  NO NEW LABS.    Review of Systems  Constitutional: Negative for malaise/fatigue.  Respiratory: Negative for cough and shortness of breath.   Cardiovascular: Negative for chest pain, palpitations and leg swelling.  Gastrointestinal: Negative for abdominal pain, constipation and heartburn.  Musculoskeletal: Negative for back pain, joint pain and myalgias.  Skin: Negative.   Neurological: Negative for dizziness.  Psychiatric/Behavioral: The patient is not nervous/anxious.    Physical Exam  Constitutional: She is oriented to person, place, and time. She appears well-developed and well-nourished. No distress.  Neck: No thyromegaly present.  Cardiovascular: Normal rate, regular rhythm, normal heart sounds and intact distal pulses.  Pulmonary/Chest: Effort normal and breath sounds normal. No respiratory distress.  Abdominal: Soft. Bowel sounds are normal. She exhibits no distension. There is no tenderness.  Musculoskeletal: She exhibits no edema.  Is able  to move all extremities Is status post right pubic ramus fracture.    Lymphadenopathy:    She has no cervical adenopathy.  Neurological: She is alert and oriented to person, place, and time.  Skin: Skin is warm and dry. She is not diaphoretic.  Psychiatric: She has a normal mood and affect.     ASSESSMENT/ PLAN:   Patient is being discharged with the following home health services:  Pt/ot/rn: to evaluate and treat as indicated for gait balance strength adl training and medication management.   Patient is being discharged with the following durable medical equipment:  Light weight wheelchair with cushion elevated leg rests anti-tippers and brake extensions to allow her to maintain her current level of independence with her adls which cannot be achieved with a walker. She can self propel and does require a light weight chair due to poor endurance.   Patient has been advised to f/u with their PCP in 1-2 weeks to bring them up to date on their rehab stay.  Social services at facility was responsible for arranging this appointment.  Pt was provided with a 30 day supply of prescriptions for medications and refills must be obtained from their PCP.  For controlled substances, a more limited supply may be provided adequate until PCP appointment only.  A 30 day supply of prescription medications written as listed above #20 ultram 50 mg tabs   Time spent with patient: 40 minutes: discussed home health needs; medications; and dme. Verbalized understanding.    Ok Edwards NP [facility]  Contact [phone] Monday through Friday 8am- 5pm  After hours call [phone]

## 2018-02-18 NOTE — Telephone Encounter (Signed)
Patient called to reschedule her appointment after receiving a no show letter. She wanted to make sure you know why she had not been back to see you. She broke her plevic in 2 place's. She is in rehab and has rescheduled to 03-27-18.

## 2018-02-20 ENCOUNTER — Other Ambulatory Visit: Payer: Self-pay | Admitting: Gastroenterology

## 2018-02-21 ENCOUNTER — Telehealth: Payer: Self-pay | Admitting: Family Medicine

## 2018-02-21 ENCOUNTER — Other Ambulatory Visit: Payer: Self-pay

## 2018-02-21 DIAGNOSIS — S32511D Fracture of superior rim of right pubis, subsequent encounter for fracture with routine healing: Secondary | ICD-10-CM | POA: Diagnosis not present

## 2018-02-21 DIAGNOSIS — Z6833 Body mass index (BMI) 33.0-33.9, adult: Secondary | ICD-10-CM | POA: Diagnosis not present

## 2018-02-21 DIAGNOSIS — M80051D Age-related osteoporosis with current pathological fracture, right femur, subsequent encounter for fracture with routine healing: Secondary | ICD-10-CM | POA: Diagnosis not present

## 2018-02-21 DIAGNOSIS — Z87890 Personal history of sex reassignment: Secondary | ICD-10-CM | POA: Diagnosis not present

## 2018-02-21 DIAGNOSIS — M1990 Unspecified osteoarthritis, unspecified site: Secondary | ICD-10-CM | POA: Diagnosis not present

## 2018-02-21 DIAGNOSIS — E669 Obesity, unspecified: Secondary | ICD-10-CM | POA: Diagnosis not present

## 2018-02-21 DIAGNOSIS — E119 Type 2 diabetes mellitus without complications: Secondary | ICD-10-CM | POA: Diagnosis not present

## 2018-02-21 DIAGNOSIS — J4520 Mild intermittent asthma, uncomplicated: Secondary | ICD-10-CM | POA: Diagnosis not present

## 2018-02-21 DIAGNOSIS — K7581 Nonalcoholic steatohepatitis (NASH): Secondary | ICD-10-CM | POA: Diagnosis not present

## 2018-02-21 DIAGNOSIS — I10 Essential (primary) hypertension: Secondary | ICD-10-CM | POA: Diagnosis not present

## 2018-02-21 NOTE — Patient Outreach (Signed)
[place] [facility]) Care Management  02/21/2018  Jennifer [name] [birth year] [id number]  Transition of care  Referral date: 02/21/18 Referral source: discharged from [facility] at the [place] on 02/20/18 Insurance: humana Attempt #1  Telephone call to patient regarding referral. Unable to reach patient. HIPAA compliant voice message left with call back phone number.   PLAN: RNCM will attempt 2nd telephone call to patient within 4 business days. RNCM will send outreach letter.   [name] RN,BSN, [place] Telephonic  [phone]

## 2018-02-21 NOTE — Telephone Encounter (Signed)
Jennifer [name] with [facility] is requesting verbal orders for in home nursing visits as follows:  Twice a week for 2 weeks Once a week for 2 weeks  Please advise. Thanks TNP

## 2018-02-22 DIAGNOSIS — E669 Obesity, unspecified: Secondary | ICD-10-CM | POA: Diagnosis not present

## 2018-02-22 DIAGNOSIS — J4520 Mild intermittent asthma, uncomplicated: Secondary | ICD-10-CM | POA: Diagnosis not present

## 2018-02-22 DIAGNOSIS — M80051D Age-related osteoporosis with current pathological fracture, right femur, subsequent encounter for fracture with routine healing: Secondary | ICD-10-CM | POA: Diagnosis not present

## 2018-02-22 DIAGNOSIS — M1990 Unspecified osteoarthritis, unspecified site: Secondary | ICD-10-CM | POA: Diagnosis not present

## 2018-02-22 DIAGNOSIS — Z6833 Body mass index (BMI) 33.0-33.9, adult: Secondary | ICD-10-CM | POA: Diagnosis not present

## 2018-02-22 DIAGNOSIS — Z87890 Personal history of sex reassignment: Secondary | ICD-10-CM | POA: Diagnosis not present

## 2018-02-22 DIAGNOSIS — E119 Type 2 diabetes mellitus without complications: Secondary | ICD-10-CM | POA: Diagnosis not present

## 2018-02-22 DIAGNOSIS — I10 Essential (primary) hypertension: Secondary | ICD-10-CM | POA: Diagnosis not present

## 2018-02-22 DIAGNOSIS — K7581 Nonalcoholic steatohepatitis (NASH): Secondary | ICD-10-CM | POA: Diagnosis not present

## 2018-02-22 NOTE — Telephone Encounter (Signed)
Proceed with [facility] [place] visits for assessment and treatments twice a week for 2 weeks then once a week for 2 weeks due to fractured pelvis and ambulatory difficulties.

## 2018-02-22 NOTE — Telephone Encounter (Signed)
Please review

## 2018-02-25 NOTE — Telephone Encounter (Signed)
[place] nurse Please follow up on this, I sent it to him while doing triage. Thanks

## 2018-02-25 NOTE — Telephone Encounter (Signed)
Spoke to [facility] from [facility] and advised that Simona Huh gave the ok with the verbal orders requested.  dbs,cma

## 2018-02-25 NOTE — Telephone Encounter (Signed)
Jennifer [name] with [facility] advised to go ahead with verbal orders she requested per dennis.  dbs,cma

## 2018-02-26 DIAGNOSIS — K7581 Nonalcoholic steatohepatitis (NASH): Secondary | ICD-10-CM | POA: Diagnosis not present

## 2018-02-26 DIAGNOSIS — I10 Essential (primary) hypertension: Secondary | ICD-10-CM | POA: Diagnosis not present

## 2018-02-26 DIAGNOSIS — M80051D Age-related osteoporosis with current pathological fracture, right femur, subsequent encounter for fracture with routine healing: Secondary | ICD-10-CM | POA: Diagnosis not present

## 2018-02-26 DIAGNOSIS — J4520 Mild intermittent asthma, uncomplicated: Secondary | ICD-10-CM | POA: Diagnosis not present

## 2018-02-26 DIAGNOSIS — E669 Obesity, unspecified: Secondary | ICD-10-CM | POA: Diagnosis not present

## 2018-02-26 DIAGNOSIS — Z6833 Body mass index (BMI) 33.0-33.9, adult: Secondary | ICD-10-CM | POA: Diagnosis not present

## 2018-02-26 DIAGNOSIS — M1990 Unspecified osteoarthritis, unspecified site: Secondary | ICD-10-CM | POA: Diagnosis not present

## 2018-02-26 DIAGNOSIS — Z87890 Personal history of sex reassignment: Secondary | ICD-10-CM | POA: Diagnosis not present

## 2018-02-26 DIAGNOSIS — E119 Type 2 diabetes mellitus without complications: Secondary | ICD-10-CM | POA: Diagnosis not present

## 2018-02-27 ENCOUNTER — Ambulatory Visit: Payer: Self-pay

## 2018-02-27 DIAGNOSIS — Z6833 Body mass index (BMI) 33.0-33.9, adult: Secondary | ICD-10-CM | POA: Diagnosis not present

## 2018-02-27 DIAGNOSIS — J4520 Mild intermittent asthma, uncomplicated: Secondary | ICD-10-CM | POA: Diagnosis not present

## 2018-02-27 DIAGNOSIS — I10 Essential (primary) hypertension: Secondary | ICD-10-CM | POA: Diagnosis not present

## 2018-02-27 DIAGNOSIS — E669 Obesity, unspecified: Secondary | ICD-10-CM | POA: Diagnosis not present

## 2018-02-27 DIAGNOSIS — Z87890 Personal history of sex reassignment: Secondary | ICD-10-CM | POA: Diagnosis not present

## 2018-02-27 DIAGNOSIS — K7581 Nonalcoholic steatohepatitis (NASH): Secondary | ICD-10-CM | POA: Diagnosis not present

## 2018-02-27 DIAGNOSIS — M80051D Age-related osteoporosis with current pathological fracture, right femur, subsequent encounter for fracture with routine healing: Secondary | ICD-10-CM | POA: Diagnosis not present

## 2018-02-27 DIAGNOSIS — M1990 Unspecified osteoarthritis, unspecified site: Secondary | ICD-10-CM | POA: Diagnosis not present

## 2018-02-27 DIAGNOSIS — E119 Type 2 diabetes mellitus without complications: Secondary | ICD-10-CM | POA: Diagnosis not present

## 2018-02-28 ENCOUNTER — Telehealth: Payer: Self-pay | Admitting: Family Medicine

## 2018-02-28 ENCOUNTER — Other Ambulatory Visit: Payer: Self-pay

## 2018-02-28 NOTE — Patient Outreach (Signed)
[facility]) Care Management  02/28/2018  Jennifer [name] April 27, 1941 [id number]  Transition of care  Referral date: 02/21/18 Referral source: discharged from [facility] the [facility] on 02/20/18 Insurance: humana  Telephone call to patient regarding transition of care follow up. HIPAA verified with patient.  Explained reason for call.  Patient states she was in a skilled nursing facility due to a fracture pelvis.  She states she is currently receiving [facility] health [facility] with [facility].  Patient states she uses her walker for ambulation. Patient reports she is doing very well. Patient states  Her son lives with her and assists her with her care. Patient states she has not scheduled a follow up appointment with her primary MD since discharged from skilled nursing facility.  RNCM discussed with patient importance of scheduling follow up appointment with her primary.   Patient verbalized understanding.    Patient states she is managing her pain with tramadol.  Denies any new symptoms or concerns at this time.  RNCM offered ongoing transition of care follow up. Patient declined. RNCM offered to mail patient [facility] care management brochure / magnet. Patient verbally agreed.  RNCM advised patient to notify MD of any changes in condition prior to scheduled appointment. RNCM provided contact name and number: [phone] or main office number [phone] and 24 hour nurse advise line [phone] by mail.  RNCM verified patient aware of 911 services for urgent/ emergent needs.   Patient returned call to [facility] to inform her that her hospital follow up appointment with primary care provider is Monday March 04, 2018 at [place] will close patient due to patient refusing services.  RNCM will send primary care provider closure notification  RNCM will send patient [facility] care management brochure/ magnet as discussed .   [name] RN,BSN,CCM [facility] Telephonic   [phone]

## 2018-02-28 NOTE — Telephone Encounter (Signed)
Jennifer [name], [place] w/ [facility] [place]  Verbal Order:  OT 2 times a week for 3 weeks   Please advise.  Thanks, [employer]

## 2018-03-01 ENCOUNTER — Other Ambulatory Visit: Payer: Self-pay | Admitting: Family Medicine

## 2018-03-01 ENCOUNTER — Telehealth: Payer: Self-pay | Admitting: Family Medicine

## 2018-03-01 DIAGNOSIS — M1990 Unspecified osteoarthritis, unspecified site: Secondary | ICD-10-CM | POA: Diagnosis not present

## 2018-03-01 DIAGNOSIS — M80051D Age-related osteoporosis with current pathological fracture, right femur, subsequent encounter for fracture with routine healing: Secondary | ICD-10-CM | POA: Diagnosis not present

## 2018-03-01 DIAGNOSIS — E119 Type 2 diabetes mellitus without complications: Secondary | ICD-10-CM | POA: Diagnosis not present

## 2018-03-01 DIAGNOSIS — Z87890 Personal history of sex reassignment: Secondary | ICD-10-CM | POA: Diagnosis not present

## 2018-03-01 DIAGNOSIS — Z6833 Body mass index (BMI) 33.0-33.9, adult: Secondary | ICD-10-CM | POA: Diagnosis not present

## 2018-03-01 DIAGNOSIS — K7581 Nonalcoholic steatohepatitis (NASH): Secondary | ICD-10-CM | POA: Diagnosis not present

## 2018-03-01 DIAGNOSIS — J4520 Mild intermittent asthma, uncomplicated: Secondary | ICD-10-CM | POA: Diagnosis not present

## 2018-03-01 DIAGNOSIS — I10 Essential (primary) hypertension: Secondary | ICD-10-CM | POA: Diagnosis not present

## 2018-03-01 DIAGNOSIS — E669 Obesity, unspecified: Secondary | ICD-10-CM | POA: Diagnosis not present

## 2018-03-01 MED ORDER — TRAMADOL HCL 50 MG PO TABS: 50.0000 mg | ORAL_TABLET | Freq: Four times a day (QID) | ORAL | 0 refills | Status: DC | PRN

## 2018-03-01 NOTE — Telephone Encounter (Signed)
Pt fell and broke hip and was prescribed traMADol (ULTRAM) 50 MG tablet byDr. Weston Settle. Pt will be out of medication over the weekend and asking if Simona Huh could prescribe enough to get through the weekend until she sees him on Mon.  Pt uses [facility].  Please call pt back to let her know if this can be done.  Thanks, [employer]

## 2018-03-01 NOTE — Telephone Encounter (Signed)
Authorized verbal order for OT with [facility] 2 times a week for 3 weeks due to pelvic fracture.

## 2018-03-01 NOTE — Telephone Encounter (Signed)
Done

## 2018-03-04 ENCOUNTER — Encounter: Payer: Self-pay | Admitting: Family Medicine

## 2018-03-04 ENCOUNTER — Ambulatory Visit (INDEPENDENT_AMBULATORY_CARE_PROVIDER_SITE_OTHER): Payer: Medicare HMO | Admitting: Family Medicine

## 2018-03-04 VITALS — BP 118/68 | HR 69 | Temp 98.5°F | Wt 164.0 lb

## 2018-03-04 DIAGNOSIS — K7460 Unspecified cirrhosis of liver: Secondary | ICD-10-CM | POA: Diagnosis not present

## 2018-03-04 DIAGNOSIS — D696 Thrombocytopenia, unspecified: Secondary | ICD-10-CM

## 2018-03-04 DIAGNOSIS — Z23 Encounter for immunization: Secondary | ICD-10-CM | POA: Diagnosis not present

## 2018-03-04 DIAGNOSIS — K7581 Nonalcoholic steatohepatitis (NASH): Secondary | ICD-10-CM

## 2018-03-04 DIAGNOSIS — I10 Essential (primary) hypertension: Secondary | ICD-10-CM

## 2018-03-04 DIAGNOSIS — S32501S Unspecified fracture of right pubis, sequela: Secondary | ICD-10-CM

## 2018-03-04 NOTE — Telephone Encounter (Signed)
Jennifer [name] with [facility] for advised.,PC

## 2018-03-04 NOTE — Telephone Encounter (Signed)
Patient was advised.  

## 2018-03-04 NOTE — Progress Notes (Signed)
Patient: Jennifer [name] Female    DOB: [birth year]   77 y.o.   MRN: [id number] Visit Date: 03/04/2018  Today's Provider: Vernie Murders, PA   Chief Complaint  Patient presents with  . Hospitalization Follow-up   Subjective:    HPI  Follow up Hospitalization  Patient was admitted to [facility] on 01/30/2018 and discharged on 02/02/2018. She was treated for closed fracture of ramus of the right pubis. Treatment for this included x-ray. Telephone follow up was done on n/a. She reports excellent compliance with treatment. She reports this condition is Improved.  ------------------------------------------------------------------------------------    Past Medical History:  Diagnosis Date  . Allergy   . Arthritis   . Asthma   . Colon polyp   . Fatty liver   . GERD (gastroesophageal reflux disease)   . Hyperlipidemia   . Hypertension   . Motion sickness    boats   Past Surgical History:  Procedure Laterality Date  . ABDOMINAL HYSTERECTOMY  1978  . COLONOSCOPY WITH PROPOFOL N/A 02/01/2015   Procedure: COLONOSCOPY WITH PROPOFOL;  Surgeon: Lucilla Lame, MD;  Location: [place];  Service: Endoscopy;  Laterality: N/A;  Diabetic - oral meds  . COLONOSCOPY WITH PROPOFOL N/A 11/22/2017   Procedure: COLONOSCOPY WITH PROPOFOL;  Surgeon: Lin Landsman, MD;  Location: [facility] ENDOSCOPY;  Service: Gastroenterology;  Laterality: N/A;  . ESOPHAGOGASTRODUODENOSCOPY (EGD) WITH PROPOFOL N/A 07/12/2017   Procedure: ESOPHAGOGASTRODUODENOSCOPY (EGD) WITH PROPOFOL;  Surgeon: Lin Landsman, MD;  Location: [facility] ENDOSCOPY;  Service: Gastroenterology;  Laterality: N/A;  . ESOPHAGOGASTRODUODENOSCOPY (EGD) WITH PROPOFOL N/A 08/16/2017   Procedure: ESOPHAGOGASTRODUODENOSCOPY (EGD) WITH PROPOFOL;  Surgeon: Lin Landsman, MD;  Location: [facility] ENDOSCOPY;  Service: Gastroenterology;  Laterality: N/A;  . ESOPHAGOGASTRODUODENOSCOPY (EGD) WITH PROPOFOL N/A 09/20/2017   Procedure:  ESOPHAGOGASTRODUODENOSCOPY (EGD) WITH PROPOFOL;  Surgeon: Lin Landsman, MD;  Location: [facility] ENDOSCOPY;  Service: Gastroenterology;  Laterality: N/A;  . ESOPHAGOGASTRODUODENOSCOPY (EGD) WITH PROPOFOL N/A 11/01/2017   Procedure: ESOPHAGOGASTRODUODENOSCOPY (EGD) WITH PROPOFOL;  Surgeon: Lin Landsman, MD;  Location: [facility] ENDOSCOPY;  Service: Gastroenterology;  Laterality: N/A;  . ESOPHAGOGASTRODUODENOSCOPY (EGD) WITH PROPOFOL N/A 01/17/2018   Procedure: ESOPHAGOGASTRODUODENOSCOPY (EGD) WITH PROPOFOL;  Surgeon: Lin Landsman, MD;  Location: [facility] ENDOSCOPY;  Service: Gastroenterology;  Laterality: N/A;  . POLYPECTOMY  02/01/2015   Procedure: POLYPECTOMY;  Surgeon: Lucilla Lame, MD;  Location: [place];  Service: Endoscopy;;  . TONSILLECTOMY AND ADENOIDECTOMY     Family History  Problem Relation Age of Onset  . Dementia Mother   . Alcohol abuse Father   . Cancer Sister        lung  . Lymphoma Sister   . Lymphoma Sister    Allergies  Allergen Reactions  . Codeine Other (See Comments)    Pt denies  . Hydrocodone-Acetaminophen Other (See Comments)    Pt denies  . Nitrofurantoin Other (See Comments)  . Nitrofurantoin Monohyd Macro     "Sugar got high"  . Penicillins     Has patient had a PCN reaction causing immediate rash, facial/tongue/throat swelling, SOB or lightheadedness with hypotension: Unknown Has patient had a PCN reaction causing severe rash involving mucus membranes or skin necrosis: Unknown Has patient had a PCN reaction that required hospitalization: Unknown Has patient had a PCN reaction occurring within the last 10 years: Unknown If all of the above answers are "NO", then may proceed with Cephalosporin use.  . Sulfa Antibiotics Other (See Comments)    Current Outpatient  Medications:  .  albuterol (PROVENTIL HFA;VENTOLIN HFA) 108 (90 Base) MCG/ACT inhaler, Inhale 2 puffs into the lungs every 6 (six) hours as needed for wheezing or shortness of  breath., Disp: 1 Inhaler, Rfl: 0 .  docusate sodium (COLACE) 100 MG capsule, Take 1 capsule (100 mg total) by mouth 2 (two) times daily as needed for mild constipation or moderate constipation., Disp: 10 capsule, Rfl: 0 .  ergocalciferol (VITAMIN D2) 50000 units capsule, Take 50,000 Units by mouth once a week. Every Friday, Disp: , Rfl:  .  furosemide (LASIX) 20 MG tablet, Give 1/2 tablet (43m) by mouth daily, Disp: , Rfl:  .  IRON PO, Take 45 mg by mouth once a week. Every Monday, Disp: , Rfl:  .  magnesium oxide (MAG-OX) 400 MG tablet, Take 400 mg by mouth 2 (two) times daily., Disp: , Rfl:  .  nadolol (CORGARD) 20 MG tablet, Take 10 mg by mouth daily. , Disp: , Rfl:  .  Nutritional Supplements (NUTRITIONAL SUPPLEMENT PO), Diet Type: NAS (no added salt) diet, NCS, Disp: , Rfl:  .  pantoprazole (PROTONIX) 40 MG tablet, Take 40 mg by mouth daily., Disp: , Rfl:  .  spironolactone (ALDACTONE) 100 MG tablet, TAKE ONE TABLET EVERY DAY, Disp: 30 tablet, Rfl: 0 .  traMADol (ULTRAM) 50 MG tablet, Take 1 tablet (50 mg total) by mouth every 6 (six) hours as needed for moderate pain., Disp: 20 tablet, Rfl: 0  Review of Systems  Constitutional: Negative.   HENT: Negative.   Respiratory: Negative.   Cardiovascular: Negative.   Gastrointestinal: Negative.   Genitourinary: Positive for pelvic pain.  Musculoskeletal: Positive for joint swelling.   Social History   Tobacco Use  . Smoking status: Former Smoker    Packs/day: 0.50    Years: 6.00    Pack years: 3.00    Types: Cigarettes    Last attempt to quit: 05/08/1987    Years since quitting: 30.8  . Smokeless tobacco: Never Used  Substance Use Topics  . Alcohol use: Never    Frequency: Never   Objective:   BP 118/68 (BP Location: Left Arm, Patient Position: Sitting, Cuff Size: Normal)   Pulse 69   Temp 98.5 F (36.9 C) (Oral)   Wt 164 lb (74.4 kg)   SpO2 97%   BMI 30.99 kg/m  Vitals:   03/04/18 0900  BP: 118/68  Pulse: 69  Temp:  98.5 F (36.9 C)  TempSrc: Oral  SpO2: 97%  Weight: 164 lb (74.4 kg)   Physical Exam  Constitutional: She is oriented to person, place, and time. She appears well-developed and well-nourished. No distress.  HENT:  Head: Normocephalic and atraumatic.  Right Ear: Hearing normal.  Left Ear: Hearing normal.  Nose: Nose normal.  Eyes: Conjunctivae and lids are normal. Right eye exhibits no discharge. Left eye exhibits no discharge. No scleral icterus.  Cardiovascular: Normal rate and regular rhythm.  Pulmonary/Chest: Effort normal and breath sounds normal. No respiratory distress.  Abdominal: Soft. Bowel sounds are normal.  Musculoskeletal: Normal range of motion. She exhibits tenderness.  Very tender right groin with increased pain with weight bearing on the right leg. Has to use a walker to ambulate.  Neurological: She is alert and oriented to person, place, and time.  Skin: Skin is intact. No lesion and no rash noted.  Psychiatric: She has a normal mood and affect. Her speech is normal and behavior is normal. Thought content normal.      Assessment & Plan:  1. Closed fracture of right pubis, unspecified portion of pubis, sequela Fell at home stepping off the edge of her driveway and fell onto the right hip area on 01-30-18. Found fracture of the right superior and inferior pubic rami on x-ray. No surgery required. Feels improved after rehab stay in [facility] [place] for 2-3 weeks (Discharged 02-20-18). Eating well and no constipation problems. Uses a walker for ambulation and has visiting nurse from [place] 2 times a week for the next 3 weeks. Using Tramadol 50 mg once or twice a day as needed. Recheck in 3 months.  2. Essential hypertension, benign Well controlled with Corgard 20 mg 1/2 tablet daily and Spironolactone 100 mg qd. Tries to restrict salt intake. No palpitations or chest pains.   3. Liver cirrhosis secondary to NASH ([place]) Ascites  controlled with use of Lasix 20 mg 1/2 tablet BID prn edema and Spironolactone 100 mg qd.. Has routine follow up with GI (Dr. Marius Ditch) planned next month. Feels this condition is stable and monitors edema of ankles.  4. Needs flu shot - Flu vaccine HIGH DOSE PF (Fluzone High dose)  5. Thrombocytopenia Platelets down to 46,000 with Hgb 11.5 in the hospital on 01-31-18. Improved to 68,000 with Hgb 11.1 on 02-08-18. Taking iron supplement and follow up with Dr. Rogue Bussing (hematology) next month. No bleeding.      Vernie Murders, PA  [facility]

## 2018-03-05 DIAGNOSIS — E669 Obesity, unspecified: Secondary | ICD-10-CM | POA: Diagnosis not present

## 2018-03-05 DIAGNOSIS — J4520 Mild intermittent asthma, uncomplicated: Secondary | ICD-10-CM | POA: Diagnosis not present

## 2018-03-05 DIAGNOSIS — Z6833 Body mass index (BMI) 33.0-33.9, adult: Secondary | ICD-10-CM | POA: Diagnosis not present

## 2018-03-05 DIAGNOSIS — I10 Essential (primary) hypertension: Secondary | ICD-10-CM | POA: Diagnosis not present

## 2018-03-05 DIAGNOSIS — Z87890 Personal history of sex reassignment: Secondary | ICD-10-CM | POA: Diagnosis not present

## 2018-03-05 DIAGNOSIS — E119 Type 2 diabetes mellitus without complications: Secondary | ICD-10-CM | POA: Diagnosis not present

## 2018-03-05 DIAGNOSIS — M1990 Unspecified osteoarthritis, unspecified site: Secondary | ICD-10-CM | POA: Diagnosis not present

## 2018-03-05 DIAGNOSIS — M80051D Age-related osteoporosis with current pathological fracture, right femur, subsequent encounter for fracture with routine healing: Secondary | ICD-10-CM | POA: Diagnosis not present

## 2018-03-05 DIAGNOSIS — K7581 Nonalcoholic steatohepatitis (NASH): Secondary | ICD-10-CM | POA: Diagnosis not present

## 2018-03-06 DIAGNOSIS — E669 Obesity, unspecified: Secondary | ICD-10-CM | POA: Diagnosis not present

## 2018-03-06 DIAGNOSIS — I10 Essential (primary) hypertension: Secondary | ICD-10-CM | POA: Diagnosis not present

## 2018-03-06 DIAGNOSIS — M1990 Unspecified osteoarthritis, unspecified site: Secondary | ICD-10-CM | POA: Diagnosis not present

## 2018-03-06 DIAGNOSIS — Z87890 Personal history of sex reassignment: Secondary | ICD-10-CM | POA: Diagnosis not present

## 2018-03-06 DIAGNOSIS — J4520 Mild intermittent asthma, uncomplicated: Secondary | ICD-10-CM | POA: Diagnosis not present

## 2018-03-06 DIAGNOSIS — Z6833 Body mass index (BMI) 33.0-33.9, adult: Secondary | ICD-10-CM | POA: Diagnosis not present

## 2018-03-06 DIAGNOSIS — M80051D Age-related osteoporosis with current pathological fracture, right femur, subsequent encounter for fracture with routine healing: Secondary | ICD-10-CM | POA: Diagnosis not present

## 2018-03-06 DIAGNOSIS — K7581 Nonalcoholic steatohepatitis (NASH): Secondary | ICD-10-CM | POA: Diagnosis not present

## 2018-03-06 DIAGNOSIS — E119 Type 2 diabetes mellitus without complications: Secondary | ICD-10-CM | POA: Diagnosis not present

## 2018-03-07 DIAGNOSIS — Z87890 Personal history of sex reassignment: Secondary | ICD-10-CM | POA: Diagnosis not present

## 2018-03-07 DIAGNOSIS — I10 Essential (primary) hypertension: Secondary | ICD-10-CM | POA: Diagnosis not present

## 2018-03-07 DIAGNOSIS — Z6833 Body mass index (BMI) 33.0-33.9, adult: Secondary | ICD-10-CM | POA: Diagnosis not present

## 2018-03-07 DIAGNOSIS — E669 Obesity, unspecified: Secondary | ICD-10-CM | POA: Diagnosis not present

## 2018-03-07 DIAGNOSIS — J4520 Mild intermittent asthma, uncomplicated: Secondary | ICD-10-CM | POA: Diagnosis not present

## 2018-03-07 DIAGNOSIS — M1990 Unspecified osteoarthritis, unspecified site: Secondary | ICD-10-CM | POA: Diagnosis not present

## 2018-03-07 DIAGNOSIS — E119 Type 2 diabetes mellitus without complications: Secondary | ICD-10-CM | POA: Diagnosis not present

## 2018-03-07 DIAGNOSIS — M80051D Age-related osteoporosis with current pathological fracture, right femur, subsequent encounter for fracture with routine healing: Secondary | ICD-10-CM | POA: Diagnosis not present

## 2018-03-07 DIAGNOSIS — K7581 Nonalcoholic steatohepatitis (NASH): Secondary | ICD-10-CM | POA: Diagnosis not present

## 2018-03-08 DIAGNOSIS — M80051D Age-related osteoporosis with current pathological fracture, right femur, subsequent encounter for fracture with routine healing: Secondary | ICD-10-CM | POA: Diagnosis not present

## 2018-03-08 DIAGNOSIS — I10 Essential (primary) hypertension: Secondary | ICD-10-CM | POA: Diagnosis not present

## 2018-03-08 DIAGNOSIS — Z6833 Body mass index (BMI) 33.0-33.9, adult: Secondary | ICD-10-CM | POA: Diagnosis not present

## 2018-03-08 DIAGNOSIS — M1990 Unspecified osteoarthritis, unspecified site: Secondary | ICD-10-CM | POA: Diagnosis not present

## 2018-03-08 DIAGNOSIS — Z87890 Personal history of sex reassignment: Secondary | ICD-10-CM | POA: Diagnosis not present

## 2018-03-08 DIAGNOSIS — E669 Obesity, unspecified: Secondary | ICD-10-CM | POA: Diagnosis not present

## 2018-03-08 DIAGNOSIS — J4520 Mild intermittent asthma, uncomplicated: Secondary | ICD-10-CM | POA: Diagnosis not present

## 2018-03-08 DIAGNOSIS — E119 Type 2 diabetes mellitus without complications: Secondary | ICD-10-CM | POA: Diagnosis not present

## 2018-03-08 DIAGNOSIS — K7581 Nonalcoholic steatohepatitis (NASH): Secondary | ICD-10-CM | POA: Diagnosis not present

## 2018-03-12 DIAGNOSIS — E669 Obesity, unspecified: Secondary | ICD-10-CM | POA: Diagnosis not present

## 2018-03-12 DIAGNOSIS — J4520 Mild intermittent asthma, uncomplicated: Secondary | ICD-10-CM | POA: Diagnosis not present

## 2018-03-12 DIAGNOSIS — Z87890 Personal history of sex reassignment: Secondary | ICD-10-CM | POA: Diagnosis not present

## 2018-03-12 DIAGNOSIS — M1990 Unspecified osteoarthritis, unspecified site: Secondary | ICD-10-CM | POA: Diagnosis not present

## 2018-03-12 DIAGNOSIS — I10 Essential (primary) hypertension: Secondary | ICD-10-CM | POA: Diagnosis not present

## 2018-03-12 DIAGNOSIS — E119 Type 2 diabetes mellitus without complications: Secondary | ICD-10-CM | POA: Diagnosis not present

## 2018-03-12 DIAGNOSIS — M80051D Age-related osteoporosis with current pathological fracture, right femur, subsequent encounter for fracture with routine healing: Secondary | ICD-10-CM | POA: Diagnosis not present

## 2018-03-12 DIAGNOSIS — Z6833 Body mass index (BMI) 33.0-33.9, adult: Secondary | ICD-10-CM | POA: Diagnosis not present

## 2018-03-12 DIAGNOSIS — K7581 Nonalcoholic steatohepatitis (NASH): Secondary | ICD-10-CM | POA: Diagnosis not present

## 2018-03-13 DIAGNOSIS — J4520 Mild intermittent asthma, uncomplicated: Secondary | ICD-10-CM | POA: Diagnosis not present

## 2018-03-13 DIAGNOSIS — Z6833 Body mass index (BMI) 33.0-33.9, adult: Secondary | ICD-10-CM | POA: Diagnosis not present

## 2018-03-13 DIAGNOSIS — E669 Obesity, unspecified: Secondary | ICD-10-CM | POA: Diagnosis not present

## 2018-03-13 DIAGNOSIS — I10 Essential (primary) hypertension: Secondary | ICD-10-CM | POA: Diagnosis not present

## 2018-03-13 DIAGNOSIS — K7581 Nonalcoholic steatohepatitis (NASH): Secondary | ICD-10-CM | POA: Diagnosis not present

## 2018-03-13 DIAGNOSIS — E119 Type 2 diabetes mellitus without complications: Secondary | ICD-10-CM | POA: Diagnosis not present

## 2018-03-13 DIAGNOSIS — M80051D Age-related osteoporosis with current pathological fracture, right femur, subsequent encounter for fracture with routine healing: Secondary | ICD-10-CM | POA: Diagnosis not present

## 2018-03-13 DIAGNOSIS — M1990 Unspecified osteoarthritis, unspecified site: Secondary | ICD-10-CM | POA: Diagnosis not present

## 2018-03-13 DIAGNOSIS — Z87890 Personal history of sex reassignment: Secondary | ICD-10-CM | POA: Diagnosis not present

## 2018-03-14 ENCOUNTER — Other Ambulatory Visit: Payer: Self-pay | Admitting: Family Medicine

## 2018-03-14 DIAGNOSIS — Z87890 Personal history of sex reassignment: Secondary | ICD-10-CM | POA: Diagnosis not present

## 2018-03-14 DIAGNOSIS — M1990 Unspecified osteoarthritis, unspecified site: Secondary | ICD-10-CM | POA: Diagnosis not present

## 2018-03-14 DIAGNOSIS — E119 Type 2 diabetes mellitus without complications: Secondary | ICD-10-CM | POA: Diagnosis not present

## 2018-03-14 DIAGNOSIS — K7581 Nonalcoholic steatohepatitis (NASH): Secondary | ICD-10-CM | POA: Diagnosis not present

## 2018-03-14 DIAGNOSIS — J4520 Mild intermittent asthma, uncomplicated: Secondary | ICD-10-CM | POA: Diagnosis not present

## 2018-03-14 DIAGNOSIS — M80051D Age-related osteoporosis with current pathological fracture, right femur, subsequent encounter for fracture with routine healing: Secondary | ICD-10-CM | POA: Diagnosis not present

## 2018-03-14 DIAGNOSIS — E669 Obesity, unspecified: Secondary | ICD-10-CM | POA: Diagnosis not present

## 2018-03-14 DIAGNOSIS — Z6833 Body mass index (BMI) 33.0-33.9, adult: Secondary | ICD-10-CM | POA: Diagnosis not present

## 2018-03-14 DIAGNOSIS — I10 Essential (primary) hypertension: Secondary | ICD-10-CM | POA: Diagnosis not present

## 2018-03-20 DIAGNOSIS — Z87890 Personal history of sex reassignment: Secondary | ICD-10-CM | POA: Diagnosis not present

## 2018-03-20 DIAGNOSIS — I10 Essential (primary) hypertension: Secondary | ICD-10-CM | POA: Diagnosis not present

## 2018-03-20 DIAGNOSIS — Z6833 Body mass index (BMI) 33.0-33.9, adult: Secondary | ICD-10-CM | POA: Diagnosis not present

## 2018-03-20 DIAGNOSIS — E119 Type 2 diabetes mellitus without complications: Secondary | ICD-10-CM | POA: Diagnosis not present

## 2018-03-20 DIAGNOSIS — K7581 Nonalcoholic steatohepatitis (NASH): Secondary | ICD-10-CM | POA: Diagnosis not present

## 2018-03-20 DIAGNOSIS — M80051D Age-related osteoporosis with current pathological fracture, right femur, subsequent encounter for fracture with routine healing: Secondary | ICD-10-CM | POA: Diagnosis not present

## 2018-03-20 DIAGNOSIS — E669 Obesity, unspecified: Secondary | ICD-10-CM | POA: Diagnosis not present

## 2018-03-20 DIAGNOSIS — J4520 Mild intermittent asthma, uncomplicated: Secondary | ICD-10-CM | POA: Diagnosis not present

## 2018-03-20 DIAGNOSIS — M1990 Unspecified osteoarthritis, unspecified site: Secondary | ICD-10-CM | POA: Diagnosis not present

## 2018-03-21 ENCOUNTER — Other Ambulatory Visit: Payer: Self-pay | Admitting: Gastroenterology

## 2018-03-22 DIAGNOSIS — S32511D Fracture of superior rim of right pubis, subsequent encounter for fracture with routine healing: Secondary | ICD-10-CM | POA: Diagnosis not present

## 2018-03-27 ENCOUNTER — Encounter: Payer: Self-pay | Admitting: Gastroenterology

## 2018-03-27 ENCOUNTER — Ambulatory Visit: Payer: Medicare HMO | Admitting: Gastroenterology

## 2018-03-27 VITALS — BP 114/75 | HR 77 | Resp 17 | Ht 61.0 in | Wt 155.6 lb

## 2018-03-27 DIAGNOSIS — K7581 Nonalcoholic steatohepatitis (NASH): Secondary | ICD-10-CM | POA: Diagnosis not present

## 2018-03-27 DIAGNOSIS — K7460 Unspecified cirrhosis of liver: Secondary | ICD-10-CM

## 2018-03-27 DIAGNOSIS — K7290 Hepatic failure, unspecified without coma: Secondary | ICD-10-CM

## 2018-03-27 NOTE — Progress Notes (Signed)
Cephas Darby, MD [address]  [place]  [place], [place] [address]  Main: [phone]  Fax: [phone]    Gastroenterology Consultation  Referring Provider:     Margo Common, PA Primary Care Physician:  Margo Common, PA Primary Gastroenterologist:  Dr. Cephas Darby Reason for Consultation:   Decompensated NASH Cirrhosis        HPI:   Jennifer [name] is a 77 y.o. female referred by Dr. Margo Common, PA  for consultation & management of decompensated cirrhosis. Patient has history of metabolic syndrome. Patient was recently admitted to the hospital about 2 weeks ago with variceal bleed,and massive ascites, underwent EGD and ligation of esophageal varices. She had therapeutic paracentesis, fluid analysis consistent with portal hypertension and there as no evidence of SBP. She was temporarily intubated in the setting of massive upper GI bleed. She was discharged home on spironolactone, Lasix, nadolol and pantoprazole. She is known to have cirrhosis since 2013. She had CBC, CMP after discharge which are stable. She is here for hospital follow-up today. She continues to have abdominal distention, reports that swelling of legs has significantly improved, otherwise feeling well. She reports having loose nonbloody stools which is chronic. She denies hematemesis, melena, rectal bleeding, abdominal pain, nausea, vomiting. She recently had an ultrasound which revealed only trace ascites. Hep B surface antigen is negative, HCV antibody negative in 2017, ferritin 22 in 07/2016. She does not have a regular gi f/u for cirrhosis, and not on diuretics. She denies drinking ETOH. She reports that she stopped taking metformin and has been checking her blood glucose levels at home 4 times daily and numbers have been below 120  Follow-up visit 09/11/2017 Patient lost about 25 pounds since last visit after aggressive diuresis. I increased her Lasix to 40 mg and spironolactone 100 mg.  Patient also has pain restricting her sodium intake in her diet. She reports feeling well. Her abdominal distention and swelling of legs have completely resolved. She reports feeling slightly dizzy when she gets up in the morning. She is on nadolol for secondary prophylaxis. She denies any complaints today. She reports that her energy levels are better. She is taking oral iron. She denies black stools or rectal bleeding, hematemesis. Her most recent labs about 2 weeks ago revealed normal hemoglobin, mild thrombocytopenia. Her LFTs are almost normal, hypoalbuminemia resolved. She has preserved kidney function and electrolytes are normal  Follow-up visit 11/16/2017 She reports feeling well. She denies abdominal distention or swelling of legs. She continues to take Lasix 40 mg and spironolactone 100 mg daily. She is taking Protonix daily, and nadolol. Her last A1c was 6. She's not taking oral iron any more. She is no longer anemic. I performed her last EGD about 2 weeks ago, showed large esophageal varices with no red well signs. There was significant scarring from prior banding, therefore I did not perform ligation.  Follow-up visit 03/27/2018 Patient had right pelvic fracture after mechanical fall in 01/2018.  He did not undergo surgery per orthopedics.  She was undergoing PT at skilled nursing facility.  Patient reported that her diuretics were held at rehab, resulted in severe swelling of legs as well as abdominal distention, gained about 15 pounds.  She was restarted back on diuretics and her swelling resolved, she lost about 10 pounds on diuretics.  Currently, she is taking Lasix 20 mg daily and spironolactone 100 mg daily.  She reports doing well.  Taking oral iron 1 pill daily.  Denies hematochezia, melena, hematemesis or abdominal pain.  NSAIDs: none  Antiplts/Anticoagulants/Anti thrombotics: none  GI Procedures:   EGD 01/17/18 - Normal duodenal bulb and second portion of the duodenum. - Portal  hypertensive gastropathy. - A few gastric polyps.resected, Clip (MR conditional) was placed. - Large (> 5 mm) esophageal varices, banding was not performed due to device malfunction.  DIAGNOSIS:  A. STOMACH POLYPS X2, BODY; HOT SNARE:  - HYPERPLASTIC TYPE GASTRIC POLYP.  - NEGATIVE FOR H. PYLORI, DYSPLASIA, AND MALIGNANCY.   For EGD 11/01/2017 - Normal duodenal bulb and second portion of the duodenum. - Portal hypertensive gastropathy. - A few gastric polyps. - Large (> 5 mm) esophageal varices. - No specimens collected.  EGD 09/20/2017 - Normal duodenal bulb and second portion of the duodenum. - Portal hypertensive gastropathy. - Recently bleeding large (> 5 mm) esophageal varices. Incompletely eradicated. Bandedx3. - No specimens collected.  EGD 08/16/2017 - Normal duodenal bulb and second portion of the duodenum. - Portal hypertensive gastropathy. - Recently bleeding large (> 5 mm) esophageal varices. Incompletely eradicated. Banded x 4. - No specimens collected.  EGD 07/12/2017 - Normal duodenal bulb and second portion of the duodenum. - Portal hypertensive gastropathy. - Bleeding large (> 5 mm) esophageal varices. Incompletely eradicated. Banded. - No specimens collected.  Colonoscopy 04/03/2005 normal  Colonoscopy 02/01/2015 - One 8 mm polyp in the ascending colon. Resected and retrieved. Clips were placed. - One 4 mm polyp in the transverse colon. Resected and retrieved. - One 7 mm polyp in the sigmoid colon. Resected and retrieved. Clip was placed. - Non-bleeding internal hemorrhoids. 1. Colon, polyp(s), ascending colon polyp - SESSILE SERRATED POLYP. - NO DYSPLASIA OR MALIGNANCY. 2. Colon, polyp(s), transverse colon polyp - TUBULAR ADENOMA. - NO HIGH GRADE DYSPLASIA OR MALIGNANCY. 3. Colon, polyp(s), sigmoid colon polyp - TUBULAR ADENOMA. - NO HIGH GRADE DYSPLASIA OR MALIGNANCY.  Colonoscopy 11/22/2017 - One 5 mm polyp in the proximal ascending colon,  removed with a cold biopsy forceps. Resected and retrieved. - Rectal varices. - Non-bleeding internal hemorrhoids. DIAGNOSIS:  A. COLON POLYP X 1, ASCENDING; COLD BIOPSY:  - TUBULAR ADENOMA.  - NEGATIVE FOR HIGH-GRADE DYSPLASIA AND MALIGNANCY.   Past Medical History:  Diagnosis Date  . Allergy   . Arthritis   . Asthma   . Colon polyp   . Fatty liver   . GERD (gastroesophageal reflux disease)   . Hyperlipidemia   . Hypertension   . Motion sickness    boats    Past Surgical History:  Procedure Laterality Date  . ABDOMINAL HYSTERECTOMY  1978  . COLONOSCOPY WITH PROPOFOL N/A 02/01/2015   Procedure: COLONOSCOPY WITH PROPOFOL;  Surgeon: Lucilla Lame, MD;  Location: [place];  Service: Endoscopy;  Laterality: N/A;  Diabetic - oral meds  . COLONOSCOPY WITH PROPOFOL N/A 11/22/2017   Procedure: COLONOSCOPY WITH PROPOFOL;  Surgeon: Lin Landsman, MD;  Location: [facility] ENDOSCOPY;  Service: Gastroenterology;  Laterality: N/A;  . ESOPHAGOGASTRODUODENOSCOPY (EGD) WITH PROPOFOL N/A 07/12/2017   Procedure: ESOPHAGOGASTRODUODENOSCOPY (EGD) WITH PROPOFOL;  Surgeon: Lin Landsman, MD;  Location[facility]OSCOPY;  Service: Gastroenterology;  Laterality: N/A;  . ESOPHAGOGASTRODUODENOSCOPY (EGD) WITH PROPOFOL N/A 08/16/2017   Procedure: ESOPHAGOGASTRODUODENOSCOPY (EGD) WITH PROPOFOL;  Surgeon: Lin Landsman, MD;  Location: [facility] ENDOSCOPY;  Service: Gastroenterology;  Laterality: N/A;  . ESOPHAGOGASTRODUODENOSCOPY (EGD) WITH PROPOFOL N/A 09/20/2017   Procedure: ESOPHAGOGASTRODUODENOSCOPY (EGD) WITH PROPOFOL;  Surgeon: Lin Landsman, MD;  Location[facility]OSCOPY;  Service: Gastroenterology;  Laterality: N/A;  .  ESOPHAGOGASTRODUODENOSCOPY (EGD) WITH PROPOFOL N/A 11/01/2017   Procedure: ESOPHAGOGASTRODUODENOSCOPY (EGD) WITH PROPOFOL;  Surgeon: Lin Landsman, MD;  Location: [place];  Service: Gastroenterology;  Laterality: N/A;  . ESOPHAGOGASTRODUODENOSCOPY (EGD) WITH  PROPOFOL N/A 01/17/2018   Procedure: ESOPHAGOGASTRODUODENOSCOPY (EGD) WITH PROPOFOL;  Surgeon: Lin Landsman, MD;  Location: [facility]OSCOPY;  Service: Gastroenterology;  Laterality: N/A;  . POLYPECTOMY  02/01/2015   Procedure: POLYPECTOMY;  Surgeon: Lucilla Lame, MD;  Location: [place];  Service: Endoscopy;;  . TONSILLECTOMY AND ADENOIDECTOMY       Current Outpatient Medications:  .  albuterol (PROVENTIL HFA;VENTOLIN HFA) 108 (90 Base) MCG/ACT inhaler, Inhale 2 puffs into the lungs every 6 (six) hours as needed for wheezing or shortness of breath., Disp: 1 Inhaler, Rfl: 0 .  ergocalciferol (VITAMIN D2) 50000 units capsule, Take 50,000 Units by mouth once a week. Every Friday, Disp: , Rfl:  .  furosemide (LASIX) 20 MG tablet, Give 1/2 tablet (23m) by mouth daily, Disp: , Rfl:  .  IRON PO, Take 45 mg by mouth once a week. Every Monday, Disp: , Rfl:  .  magnesium oxide (MAG-OX) 400 MG tablet, Take 400 mg by mouth 2 (two) times daily., Disp: , Rfl:  .  nadolol (CORGARD) 20 MG tablet, Take 10 mg by mouth daily. , Disp: , Rfl:  .  Nutritional Supplements (NUTRITIONAL SUPPLEMENT PO), Diet Type: NAS (no added salt) diet, NCS, Disp: , Rfl:  .  pantoprazole (PROTONIX) 40 MG tablet, Take 40 mg by mouth daily., Disp: , Rfl:  .  spironolactone (ALDACTONE) 100 MG tablet, TAKE ONE TABLET EVERY DAY, Disp: 30 tablet, Rfl: 0 .  traMADol (ULTRAM) 50 MG tablet, TAKE ONE TABLET BY MOUTH EVERY 6 HOURS AS NEEDED FOR MODERATE PAIN, Disp: 30 tablet, Rfl: 0 .  docusate sodium (COLACE) 100 MG capsule, Take 1 capsule (100 mg total) by mouth 2 (two) times daily as needed for mild constipation or moderate constipation. (Patient not taking: Reported on 03/27/2018), Disp: 10 capsule, Rfl: 0   Family History  Problem Relation Age of Onset  . Dementia Mother   . Alcohol abuse Father   . Cancer Sister        lung  . Lymphoma Sister   . Lymphoma Sister      Social History   Tobacco Use  . Smoking  status: Former Smoker    Packs/day: 0.50    Years: 6.00    Pack years: 3.00    Types: Cigarettes    Last attempt to quit: 05/08/1987    Years since quitting: 30.9  . Smokeless tobacco: Never Used  Substance Use Topics  . Alcohol use: Never    Frequency: Never  . Drug use: Never    Allergies as of 03/27/2018 - Review Complete 03/27/2018  Allergen Reaction Noted  . Codeine Other (See Comments) 10/14/2014  . Hydrocodone-acetaminophen Other (See Comments) 10/14/2014  . Nitrofurantoin Other (See Comments) 11/05/2014  . Nitrofurantoin monohyd macro  10/14/2014  . Penicillins  10/14/2014  . Sulfa antibiotics Other (See Comments) 10/14/2014    Review of Systems:    All systems reviewed and negative except where noted in HPI.   Physical Exam:  BP 114/75 (BP Location: Left Arm, Patient Position: Sitting, Cuff Size: Normal)   Pulse 77   Resp 17   Ht 5' 1"  (1.549 m)   Wt 155 lb 9.6 oz (70.6 kg)   BMI 29.40 kg/m  No LMP recorded. Patient has had a hysterectomy.  General:  Alert,  Well-developed, well-nourished, pleasant and cooperative in NAD Head:  Normocephalic and atraumatic. Eyes:  Sclera clear, no icterus.   Conjunctiva pink. Ears:  Normal auditory acuity. Nose:  No deformity, discharge, or lesions. Mouth:  No deformity or lesions,oropharynx pink & moist. Neck:  Supple; no masses or thyromegaly. Lungs:  Respirations even and unlabored.  Clear throughout to auscultation.   No wheezes, crackles, or rhonchi. No acute distress. Heart:  Regular rate and rhythm; no murmurs, clicks, rubs, or gallops. Abdomen:  Normal bowel sounds. Soft, non-tender and non distended, no hepatosplenomegaly.  No guarding or rebound tenderness.   Rectal: Not performed Msk:  Symmetrical without gross deformities. Good, equal movement & strength bilaterally. Pulses:  Normal pulses noted. Extremities:  No clubbing, no edema.  No cyanosis. Neurologic:  Alert and oriented x3;  grossly normal  neurologically. Skin:  Intact without significant lesions or rashes. No jaundice. Psych:  Alert and cooperative. Normal mood and affect.  Imaging Studies: reviewed  Assessment and Plan:   GRACELYNN BIRCHER is a 77 y.o. Caucasian female with metabolic syndrome, diabetes, hypertension, hyperlipidemia seen for follow-up of decompensated cirrhosis with ascites and variceal bleed.  Decompensated cirrhosis: probably secondary to NASH in the setting of metabolic syndrome Hepatitis B and C negative, received hepatitis B vaccine in 2013. Normal ferritin levels Portal hypertension: manifested as ascites, thrombocytopenia, esophageal varices Volume overload: currently euvolemic, status post therapeutic paracentesis on 07/13/2017. She developed large volume ascites and responded very well to diuretics. Continue spironolactone 100 mg, and Lasix 20-40 mg daily, continue low-sodium diet. Recheck labs today Esophageal varices: status post banding on 07/12/2017. EGD in 08/2017, ligation performed.  EGD in 10/2017, ligation was not performed due to extensive scarring from prior banding. Continue nadolol, suggested to increase to 30 or 40 mg daily if able to tolerate without dizziness or lightheadedness.  Attempted variceal banding in 01/2018, unsuccessful due to device malfunction.  Will repeat EGD in next 2 to 3 months Anemia/thrombocytopenia/coagulopathy:anemia resolved,does not have B12 or iron deficiency, mild thrombocytopenia and stable, recheck labs today PSE: None HCC surveillance: normal AFP in 07/2017, ultrasound in 07/2017 and CT in 08/2017 with no liver lesions. Repeat imaging in in next visit HRS: None Check labs today She is immune to hepatitis A and B, received vaccinations in 2013 Received Pneumovax and prevnar  History of tubular adenomas:  Repeat surveillance colonoscopy 11/2022   Follow up in 3 months   Cephas Darby, MD

## 2018-03-28 LAB — COMPREHENSIVE METABOLIC PANEL
A/G RATIO: 1.4 (ref 1.2–2.2)
ALT: 19 IU/L (ref 0–32)
AST: 33 IU/L (ref 0–40)
Albumin: 3.9 g/dL (ref 3.5–4.8)
Alkaline Phosphatase: 97 IU/L (ref 39–117)
BILIRUBIN TOTAL: 0.9 mg/dL (ref 0.0–1.2)
BUN / CREAT RATIO: 16 (ref 12–28)
BUN: 16 mg/dL (ref 8–27)
CHLORIDE: 93 mmol/L — AB (ref 96–106)
CO2: 26 mmol/L (ref 20–29)
Calcium: 9.8 mg/dL (ref 8.7–10.3)
Creatinine, Ser: 1 mg/dL (ref 0.57–1.00)
GFR calc non Af Amer: 54 mL/min/{1.73_m2} — ABNORMAL LOW (ref >59)
GFR, EST AFRICAN AMERICAN: 62 mL/min/{1.73_m2} (ref >59)
GLUCOSE: 95 mg/dL (ref 65–99)
Globulin, Total: 2.8 g/dL (ref 1.5–4.5)
Potassium: 4.3 mmol/L (ref 3.5–5.2)
Sodium: 131 mmol/L — ABNORMAL LOW (ref 134–144)
Total Protein: 6.7 g/dL (ref 6.0–8.5)

## 2018-03-28 LAB — CBC
HEMATOCRIT: 37.4 % (ref 34.0–46.6)
HEMOGLOBIN: 12.9 g/dL (ref 11.1–15.9)
MCH: 31.1 pg (ref 26.6–33.0)
MCHC: 34.5 g/dL (ref 31.5–35.7)
MCV: 90 fL (ref 79–97)
Platelets: 89 10*3/uL — CL (ref 150–450)
RBC: 4.15 x10E6/uL (ref 3.77–5.28)
RDW: 13.2 % (ref 12.3–15.4)
WBC: 4.2 10*3/uL (ref 3.4–10.8)

## 2018-03-28 LAB — B12 AND FOLATE PANEL
Folate: 11 ng/mL (ref >3.0)
Vitamin B-12: 848 pg/mL (ref 232–1245)

## 2018-03-28 LAB — MAGNESIUM: Magnesium: 1.9 mg/dL (ref 1.6–2.3)

## 2018-03-28 LAB — IRON AND TIBC
Iron Saturation: 29 % (ref 15–55)
Iron: 86 ug/dL (ref 27–139)
TIBC: 298 ug/dL (ref 250–450)
UIBC: 212 ug/dL (ref 118–369)

## 2018-03-28 LAB — FERRITIN: FERRITIN: 89 ng/mL (ref 15–150)

## 2018-04-06 ENCOUNTER — Other Ambulatory Visit: Payer: Self-pay | Admitting: Gastroenterology

## 2018-04-08 NOTE — Progress Notes (Signed)
Patient: Jennifer [name] Female    DOB: 08/04/1940   77 y.o.   MRN: [id number] Visit Date: 04/09/2018  Today's Provider: Vernie Murders, PA   Chief Complaint  Patient presents with  . Cough   Subjective:    Cough  This is a new problem. The current episode started 1 to 4 weeks ago (2 weeks). The problem has been unchanged. The cough is non-productive. Associated symptoms include chills, nasal congestion, rhinorrhea, sweats and wheezing. Pertinent negatives include no chest pain, ear congestion, ear pain, fever, headaches, heartburn, hemoptysis, myalgias, postnasal drip, rash, sore throat or weight loss. The symptoms are aggravated by lying down. She has tried prescription cough suppressant for the symptoms. The treatment provided no relief.    Patient has had a non-productive cough for 2 weeks, patient states cough is worse at night. Other symptoms include chills, sweats, nasal congestion, runny nose, and wheezing. Patient has been taking otc Dayquil with no relief.  Past Medical History:  Diagnosis Date  . Allergy   . Arthritis   . Asthma   . Colon polyp   . Fatty liver   . GERD (gastroesophageal reflux disease)   . Hyperlipidemia   . Hypertension   . Motion sickness    boats   Past Surgical History:  Procedure Laterality Date  . ABDOMINAL HYSTERECTOMY  1978  . COLONOSCOPY WITH PROPOFOL N/A 02/01/2015   Procedure: COLONOSCOPY WITH PROPOFOL;  Surgeon: Lucilla Lame, MD;  Location: [place];  Service: Endoscopy;  Laterality: N/A;  Diabetic - oral meds  . COLONOSCOPY WITH PROPOFOL N/A 11/22/2017   Procedure: COLONOSCOPY WITH PROPOFOL;  Surgeon: Lin Landsman, MD;  Location: [facility] ENDOSCOPY;  Service: Gastroenterology;  Laterality: N/A;  . ESOPHAGOGASTRODUODENOSCOPY (EGD) WITH PROPOFOL N/A 07/12/2017   Procedure: ESOPHAGOGASTRODUODENOSCOPY (EGD) WITH PROPOFOL;  Surgeon: Lin Landsman, MD;  Location: [facility] ENDOSCOPY;  Service: Gastroenterology;  Laterality:  N/A;  . ESOPHAGOGASTRODUODENOSCOPY (EGD) WITH PROPOFOL N/A 08/16/2017   Procedure: ESOPHAGOGASTRODUODENOSCOPY (EGD) WITH PROPOFOL;  Surgeon: Lin Landsman, MD;  Location: [facility] ENDOSCOPY;  Service: Gastroenterology;  Laterality: N/A;  . ESOPHAGOGASTRODUODENOSCOPY (EGD) WITH PROPOFOL N/A 09/20/2017   Procedure: ESOPHAGOGASTRODUODENOSCOPY (EGD) WITH PROPOFOL;  Surgeon: Lin Landsman, MD;  Location: [facility] ENDOSCOPY;  Service: Gastroenterology;  Laterality: N/A;  . ESOPHAGOGASTRODUODENOSCOPY (EGD) WITH PROPOFOL N/A 11/01/2017   Procedure: ESOPHAGOGASTRODUODENOSCOPY (EGD) WITH PROPOFOL;  Surgeon: Lin Landsman, MD;  Location: [facility] ENDOSCOPY;  Service: Gastroenterology;  Laterality: N/A;  . ESOPHAGOGASTRODUODENOSCOPY (EGD) WITH PROPOFOL N/A 01/17/2018   Procedure: ESOPHAGOGASTRODUODENOSCOPY (EGD) WITH PROPOFOL;  Surgeon: Lin Landsman, MD;  Location: [facility] ENDOSCOPY;  Service: Gastroenterology;  Laterality: N/A;  . POLYPECTOMY  02/01/2015   Procedure: POLYPECTOMY;  Surgeon: Lucilla Lame, MD;  Location: [place];  Service: Endoscopy;;  . TONSILLECTOMY AND ADENOIDECTOMY     Family History  Problem Relation Age of Onset  . Dementia Mother   . Alcohol abuse Father   . Cancer Sister        lung  . Lymphoma Sister   . Lymphoma Sister    Allergies  Allergen Reactions  . Codeine Other (See Comments)    Pt denies  . Hydrocodone-Acetaminophen Other (See Comments)    Pt denies  . Nitrofurantoin Other (See Comments)  . Nitrofurantoin Monohyd Macro     "Sugar got high"  . Penicillins     Has patient had a PCN reaction causing immediate rash, facial/tongue/throat swelling, SOB or lightheadedness with hypotension: Unknown Has patient had  a PCN reaction causing severe rash involving mucus membranes or skin necrosis: Unknown Has patient had a PCN reaction that required hospitalization: Unknown Has patient had a PCN reaction occurring within the last 10 years: Unknown If all of  the above answers are "NO", then may proceed with Cephalosporin use.  . Sulfa Antibiotics Other (See Comments)    Current Outpatient Medications:  .  albuterol (PROVENTIL HFA;VENTOLIN HFA) 108 (90 Base) MCG/ACT inhaler, Inhale 2 puffs into the lungs every 6 (six) hours as needed for wheezing or shortness of breath., Disp: 1 Inhaler, Rfl: 0 .  ergocalciferol (VITAMIN D2) 50000 units capsule, Take 50,000 Units by mouth once a week. Every Friday, Disp: , Rfl:  .  furosemide (LASIX) 20 MG tablet, Give 1/2 tablet (53m) by mouth daily, Disp: , Rfl:  .  nadolol (CORGARD) 20 MG tablet, Take 10 mg by mouth daily. , Disp: , Rfl:  .  pantoprazole (PROTONIX) 40 MG tablet, Take 40 mg by mouth daily., Disp: , Rfl:  .  spironolactone (ALDACTONE) 100 MG tablet, TAKE ONE TABLET EVERY DAY, Disp: 30 tablet, Rfl: 0 .  traMADol (ULTRAM) 50 MG tablet, TAKE ONE TABLET BY MOUTH EVERY 6 HOURS AS NEEDED FOR MODERATE PAIN, Disp: 30 tablet, Rfl: 0 .  docusate sodium (COLACE) 100 MG capsule, Take 1 capsule (100 mg total) by mouth 2 (two) times daily as needed for mild constipation or moderate constipation. (Patient not taking: Reported on 04/09/2018), Disp: 10 capsule, Rfl: 0 .  IRON PO, Take 45 mg by mouth once a week. Every Monday, Disp: , Rfl:  .  magnesium oxide (MAG-OX) 400 MG tablet, Take 400 mg by mouth 2 (two) times daily., Disp: , Rfl:  .  Nutritional Supplements (NUTRITIONAL SUPPLEMENT PO), Diet Type: NAS (no added salt) diet, NCS, Disp: , Rfl:   Review of Systems  Constitutional: Positive for chills. Negative for appetite change, fatigue, fever and weight loss.  HENT: Positive for congestion and rhinorrhea. Negative for ear pain, postnasal drip, sinus pressure, sinus pain and sore throat.   Respiratory: Positive for cough and wheezing. Negative for hemoptysis and chest tightness.   Cardiovascular: Negative for chest pain and palpitations.  Gastrointestinal: Negative for abdominal pain, heartburn, nausea and  vomiting.  Musculoskeletal: Negative for myalgias.  Skin: Negative for rash.  Neurological: Negative for dizziness, weakness and headaches.   Social History   Tobacco Use  . Smoking status: Former Smoker    Packs/day: 0.50    Years: 6.00    Pack years: 3.00    Types: Cigarettes    Last attempt to quit: 05/08/1987    Years since quitting: 30.9  . Smokeless tobacco: Never Used  Substance Use Topics  . Alcohol use: Never    Frequency: Never   Objective:   BP 122/65 (BP Location: Right Arm, Patient Position: Sitting, Cuff Size: Large)   Pulse 63   Temp 97.9 F (36.6 C) (Oral)   Resp 16   Wt 160 lb (72.6 kg)   SpO2 98%   BMI 30.23 kg/m   Wt Readings from Last 3 Encounters:  04/09/18 160 lb (72.6 kg)  03/27/18 155 lb 9.6 oz (70.6 kg)  03/04/18 164 lb (74.4 kg)   Vitals:   04/09/18 0811  BP: 122/65  Pulse: 63  Resp: 16  Temp: 97.9 F (36.6 C)  TempSrc: Oral  SpO2: 98%  Weight: 160 lb (72.6 kg)   Physical Exam  Constitutional: She is oriented to person, place, and time. She appears  well-developed and well-nourished. No distress.  HENT:  Head: Normocephalic and atraumatic.  Right Ear: Hearing normal.  Left Ear: Hearing normal.  Nose: Nose normal.  Eyes: Conjunctivae and lids are normal. Right eye exhibits no discharge. Left eye exhibits no discharge. No scleral icterus.  Cardiovascular: Normal rate and regular rhythm.  Pulmonary/Chest: Effort normal and breath sounds normal. No respiratory distress.  Abdominal: Soft. Bowel sounds are normal.  Musculoskeletal: Normal range of motion.  Neurological: She is alert and oriented to person, place, and time.  Skin: Skin is intact. No lesion and no rash noted.  Psychiatric: She has a normal mood and affect. Her speech is normal and behavior is normal. Thought content normal.      Assessment & Plan:      1. URI with cough and congestion Onset 2 weeks ago. No fever or sore throat. Cough, congestion and wheezing noted  more frequent at night. OTC cold medications no help. Will treat with Benzonatate with Prednisone taper for cough and wheezing. May use Albuterol inhaler prn. Recheck if no better in 5-7 days. - benzonatate (TESSALON) 200 MG capsule; Take 1 capsule (200 mg total) by mouth 2 (two) times daily as needed for cough.  Dispense: 20 capsule; Refill: 0 - predniSONE (DELTASONE) 5 MG tablet; Take 1 tablet (5 mg total) by mouth daily with breakfast. Taper by one tablet daily starting at 6 tablets (6,5,4,3,2,1)  Dispense: 21 tablet; Refill: [place], PA  [facility]

## 2018-04-09 ENCOUNTER — Ambulatory Visit (INDEPENDENT_AMBULATORY_CARE_PROVIDER_SITE_OTHER): Payer: Medicare HMO | Admitting: Family Medicine

## 2018-04-09 ENCOUNTER — Encounter: Payer: Self-pay | Admitting: Family Medicine

## 2018-04-09 VITALS — BP 122/65 | HR 63 | Temp 97.9°F | Resp 16 | Wt 160.0 lb

## 2018-04-09 DIAGNOSIS — J069 Acute upper respiratory infection, unspecified: Secondary | ICD-10-CM | POA: Diagnosis not present

## 2018-04-09 MED ORDER — PREDNISONE 5 MG PO TABS: 5.0000 mg | ORAL_TABLET | Freq: Every day | ORAL | 0 refills | Status: DC

## 2018-04-09 MED ORDER — BENZONATATE 200 MG PO CAPS: 200.0000 mg | ORAL_CAPSULE | Freq: Two times a day (BID) | ORAL | 0 refills | Status: DC | PRN

## 2018-04-15 ENCOUNTER — Other Ambulatory Visit: Payer: Self-pay | Admitting: Family Medicine

## 2018-04-15 DIAGNOSIS — J069 Acute upper respiratory infection, unspecified: Secondary | ICD-10-CM

## 2018-04-15 MED ORDER — BENZONATATE 200 MG PO CAPS: 200.0000 mg | ORAL_CAPSULE | Freq: Two times a day (BID) | ORAL | 0 refills | Status: DC | PRN

## 2018-04-15 NOTE — Telephone Encounter (Signed)
Patient just took her last Tessalon Perle.  She is still coughing and needs a refill please.  Call to [facility].

## 2018-04-15 NOTE — Telephone Encounter (Signed)
Please Review

## 2018-04-19 ENCOUNTER — Other Ambulatory Visit: Payer: Self-pay | Admitting: Gastroenterology

## 2018-04-21 DIAGNOSIS — S32511D Fracture of superior rim of right pubis, subsequent encounter for fracture with routine healing: Secondary | ICD-10-CM | POA: Diagnosis not present

## 2018-04-24 ENCOUNTER — Other Ambulatory Visit: Payer: Self-pay | Admitting: Family Medicine

## 2018-04-24 NOTE — Telephone Encounter (Signed)
Patient needs refill on Accucheck Aviva plus strips sent to [facility]

## 2018-04-25 MED ORDER — GLUCOSE BLOOD VI STRP: ORAL_STRIP | 3 refills | Status: DC

## 2018-05-03 IMAGING — US US PARACENTESIS
1 series · 9 of 9 positions shown · non-contrast
Comparison: none

INDICATION: 76-year-old female with a history of upper GI bleeding and
cirrhosis.

[Series 1: us paracentesis · 0.30mm/px · 9 of 9 slices shown]
[im 1/9]
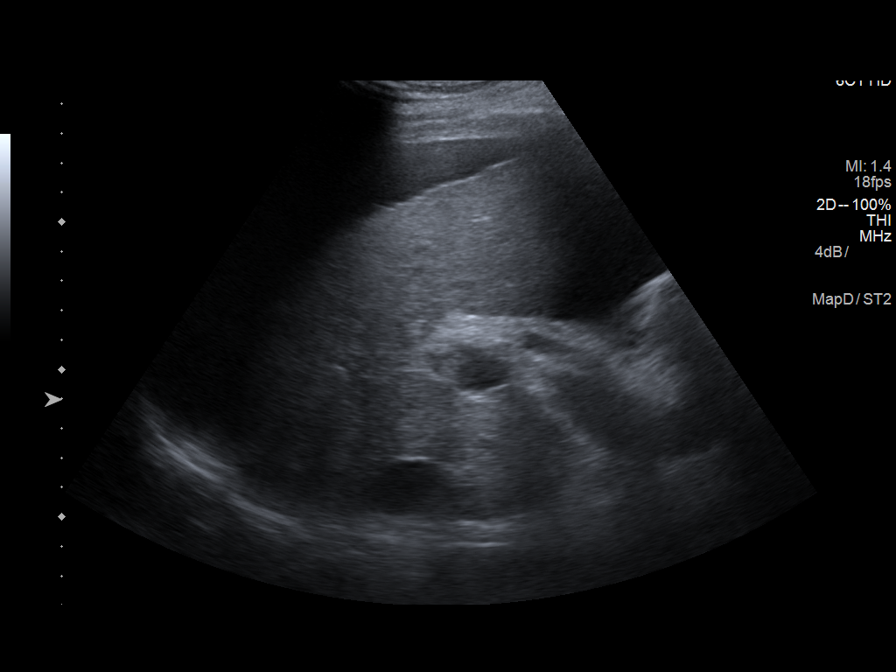
[im 2/9]
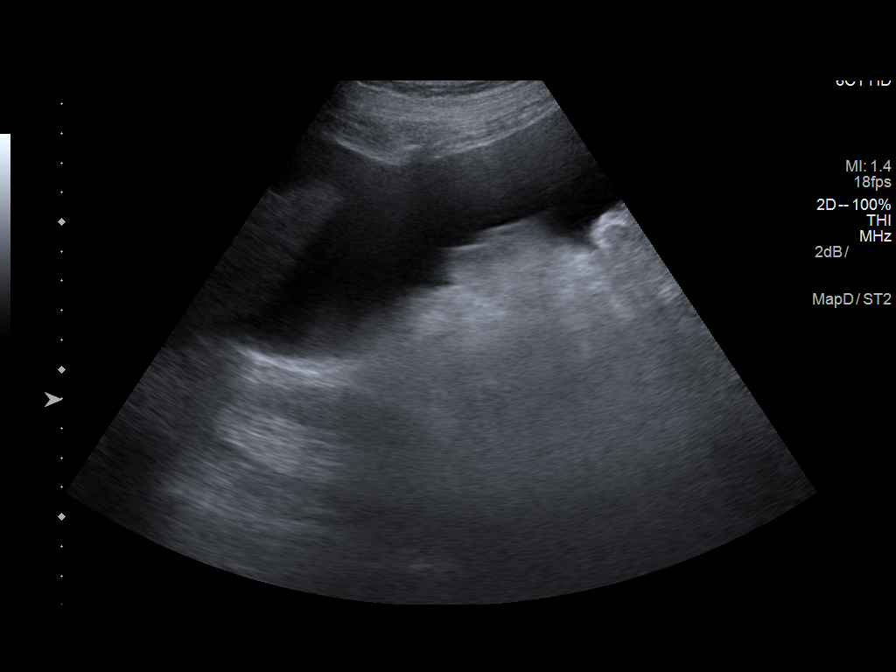
[im 3/9]
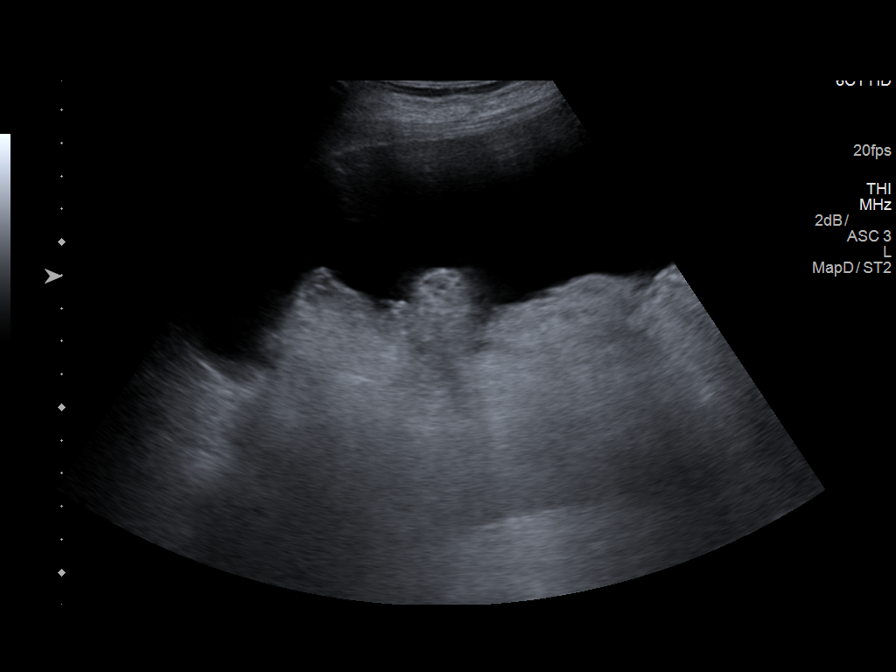
[im 4/9]
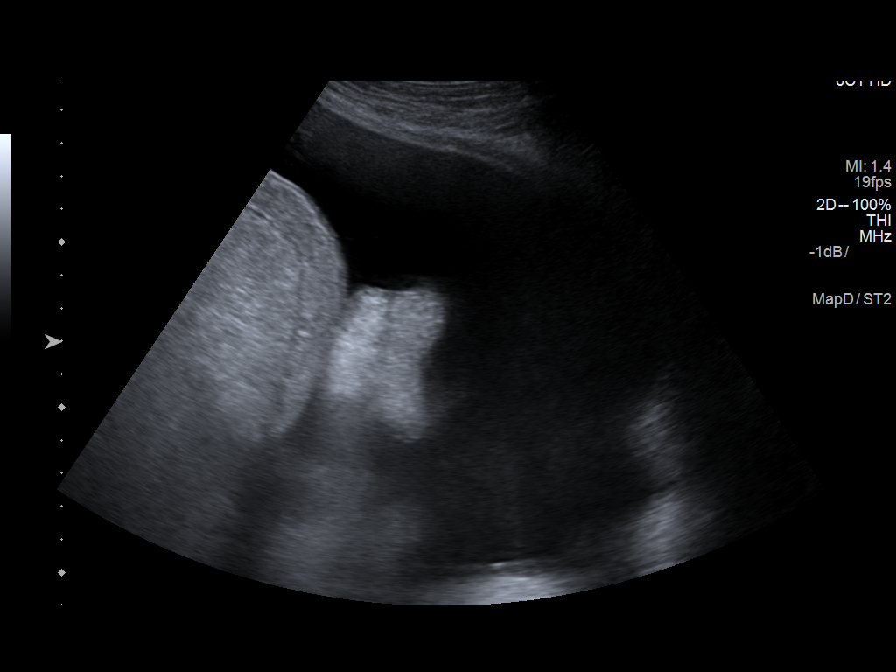
[im 5/9]
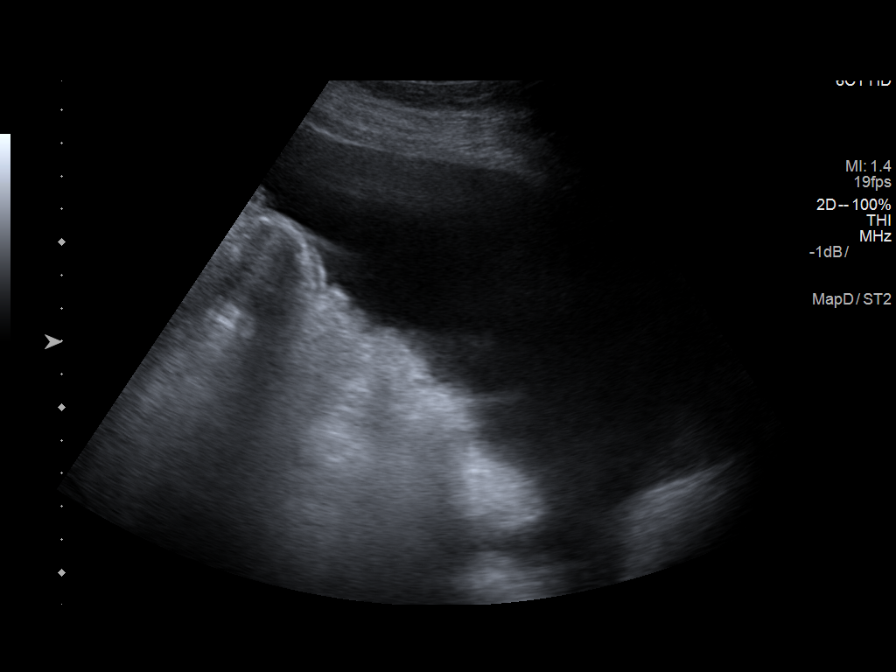
[im 6/9]
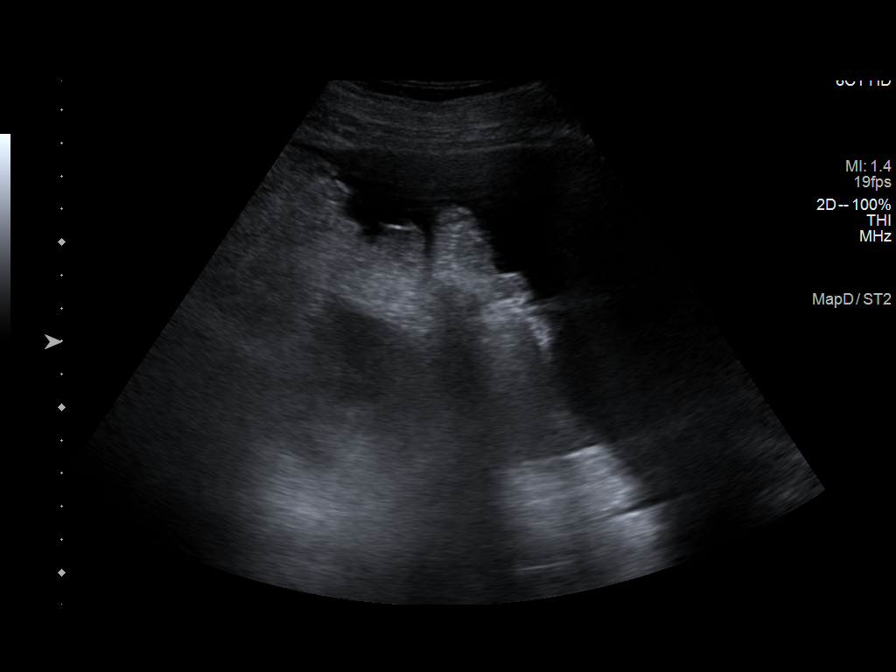
[im 7/9]
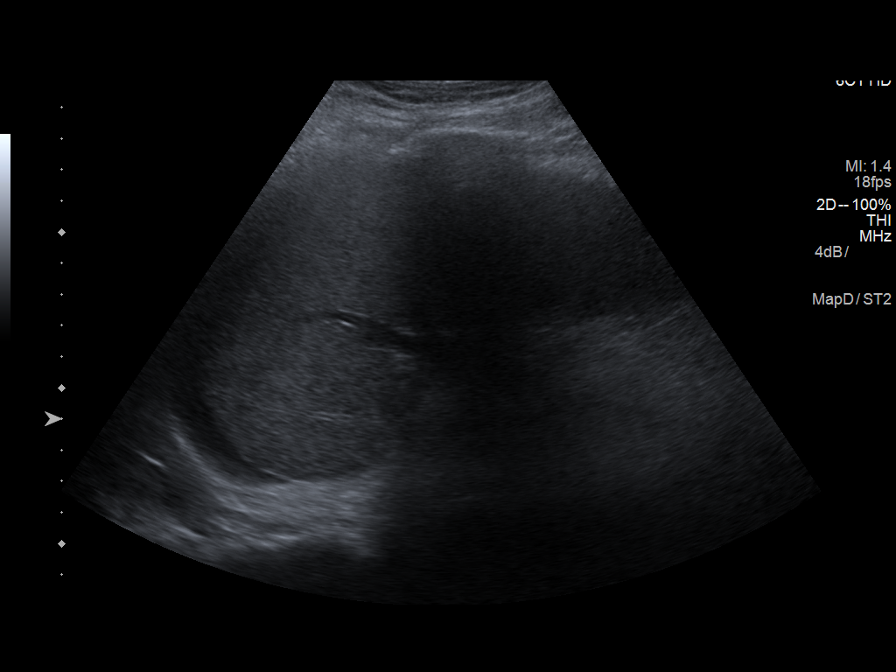
[im 8/9]
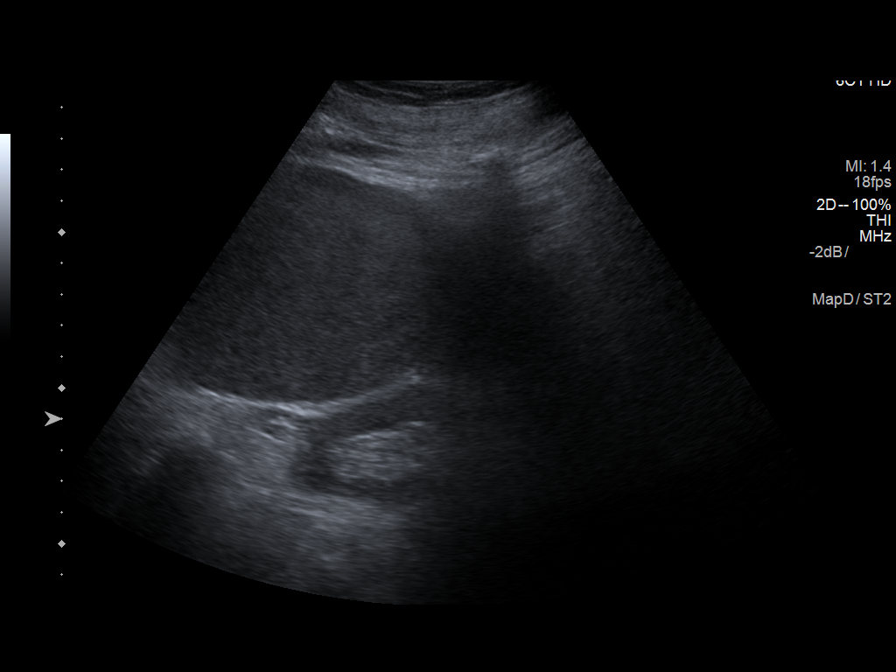
[im 9/9]
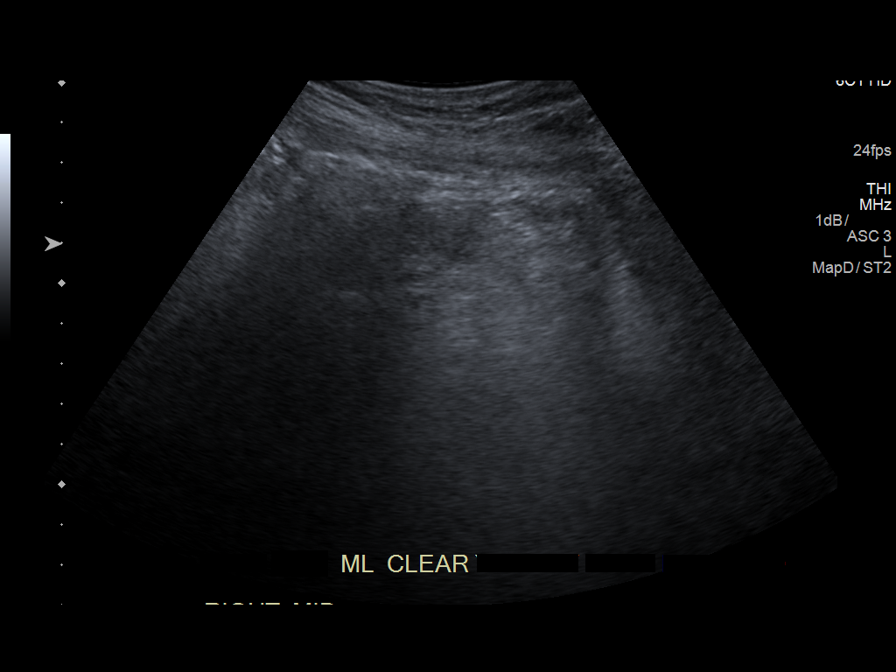

[9 of 9 positions shown; findings below may reference images not displayed]

Ascites.

EXAM:
ULTRASOUND GUIDED  PARACENTESIS

MEDICATIONS:
None.

COMPLICATIONS:
None

PROCEDURE:
Informed written consent was obtained from the patient's family
after a discussion of the risks, benefits and alternatives to
treatment. A timeout was performed prior to the initiation of the
procedure.

Initial ultrasound scanning demonstrates a large amount of ascites
within the right lower abdominal quadrant. The right lower abdomen
was prepped and draped in the usual sterile fashion. 1% lidocaine
with epinephrine was used for local anesthesia.

Following this, a 8 Fr Safe-T-Centesis catheter was introduced. An
ultrasound image was saved for documentation purposes. The
paracentesis was performed. The catheter was removed and a dressing
was applied. The patient tolerated the procedure well without
immediate post procedural complication.
FINDINGS: A total of approximately 6.5 L of thin yellow fluid was removed.
Samples were sent to the laboratory as requested by the clinical
team.
IMPRESSION: Status post bedside ultrasound guided paracentesis.

## 2018-05-04 IMAGING — US US HEPATIC LIVER DOPPLER
1 series · 14 of 25 positions shown · non-contrast
Comparison: None.

CLINICAL DATA: Ascites, cirrhosis, history of upper GI bleed

EXAM:
DUPLEX ULTRASOUND OF LIVER
TECHNIQUE: Color and duplex Doppler ultrasound was performed to evaluate the
hepatic in-flow and out-flow vessels.

[Series 1: us hepatic liver doppler · 0.26mm/px · 14 of 36 slices shown]
[im 1/36]
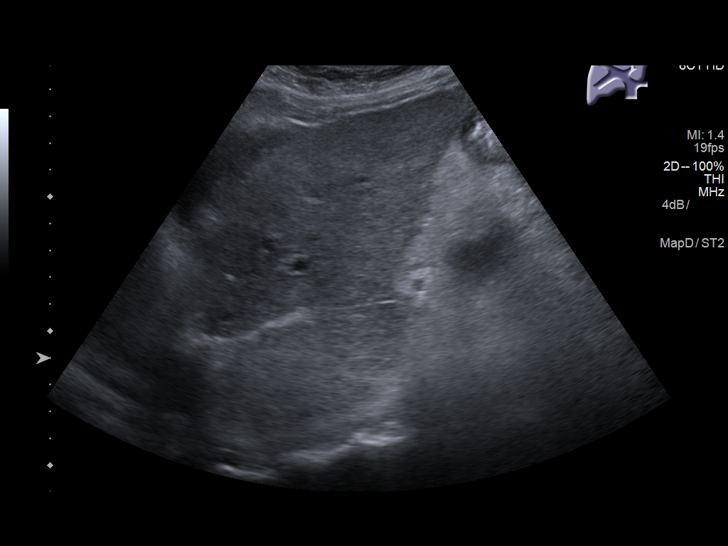
[im 3/36]
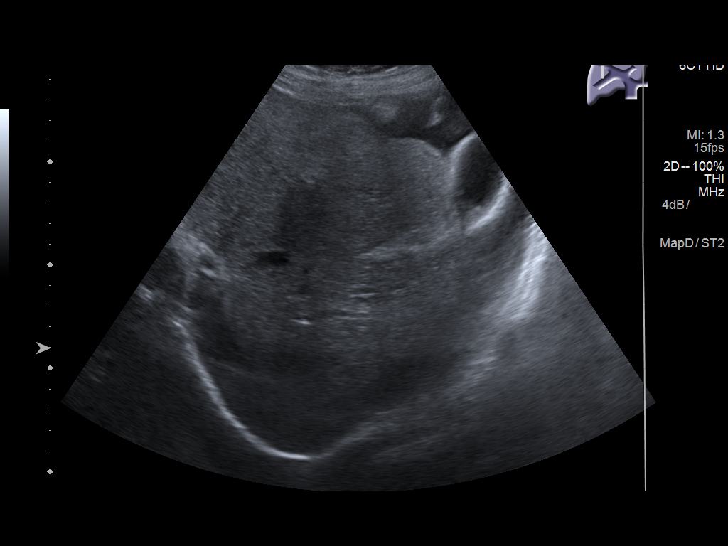
[im 6/36]
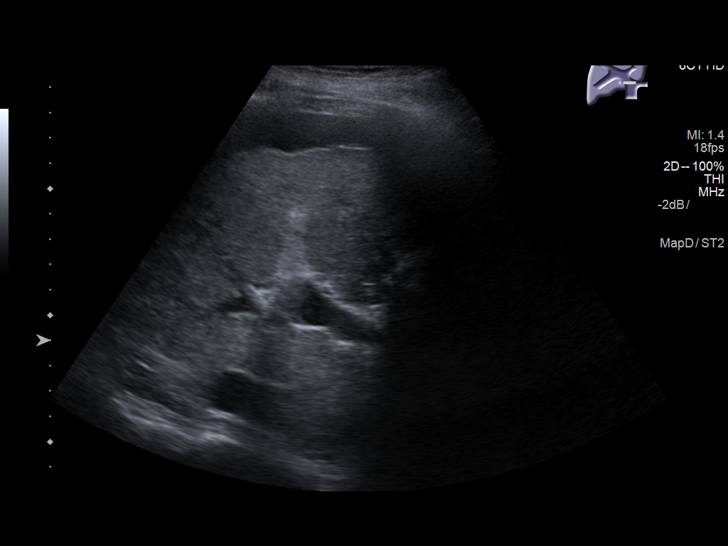
[im 9/36]
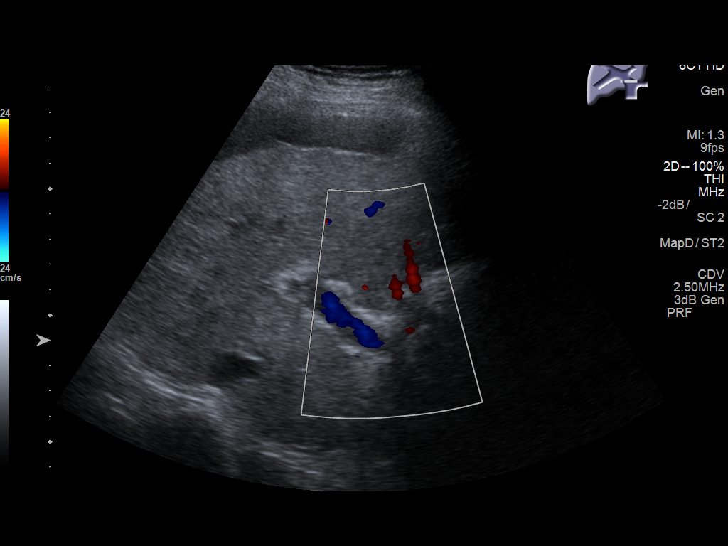
[im 12/36]
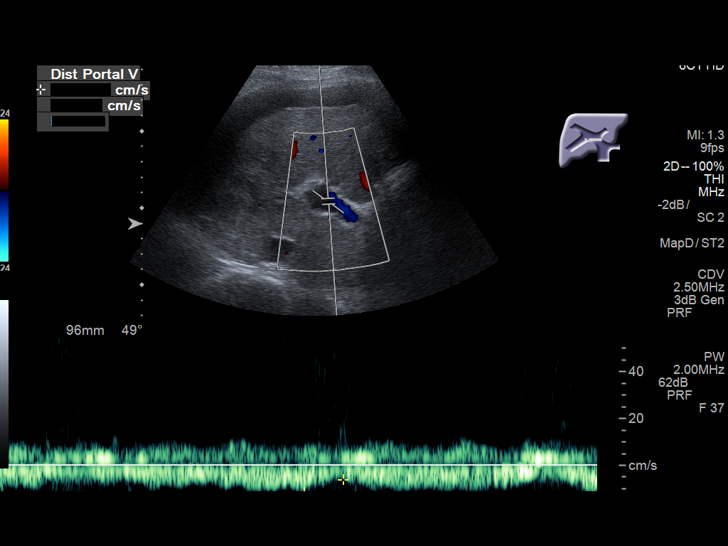
[im 14/36]
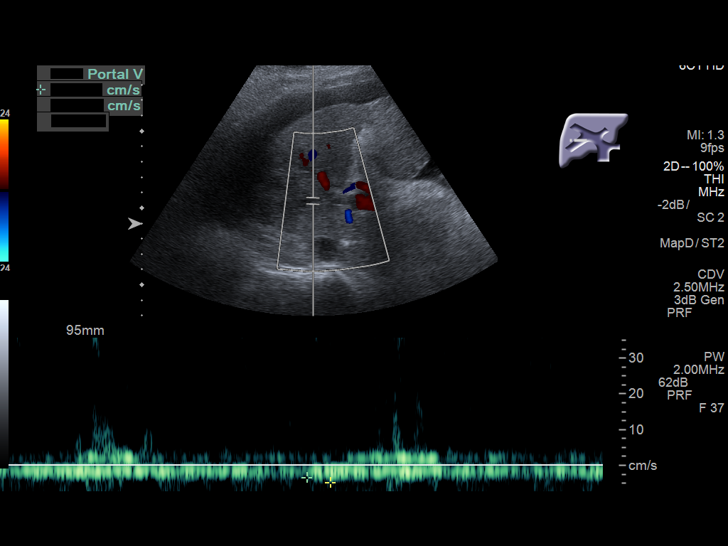
[im 17/36]
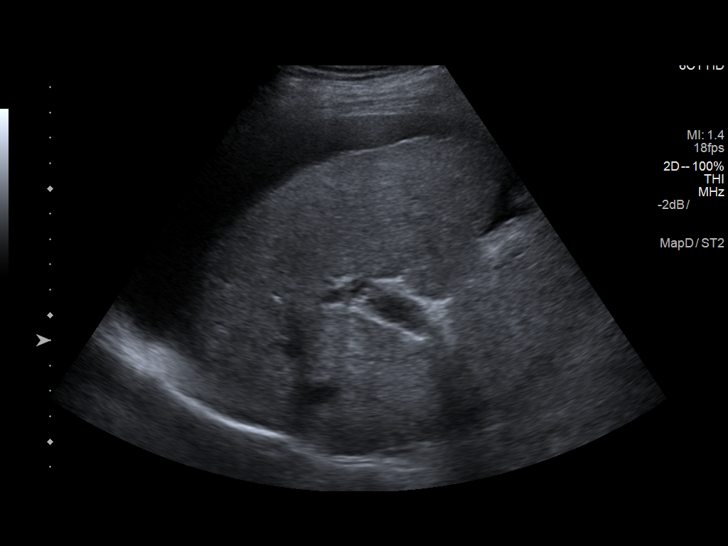
[im 19/36]
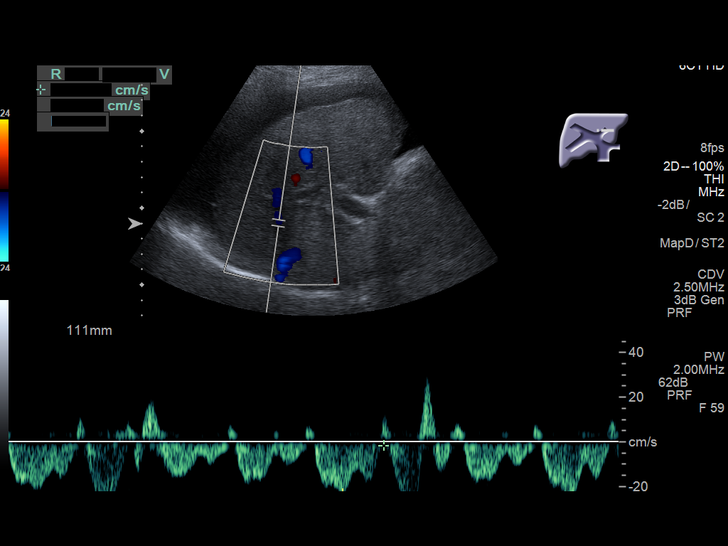
[im 22/36]
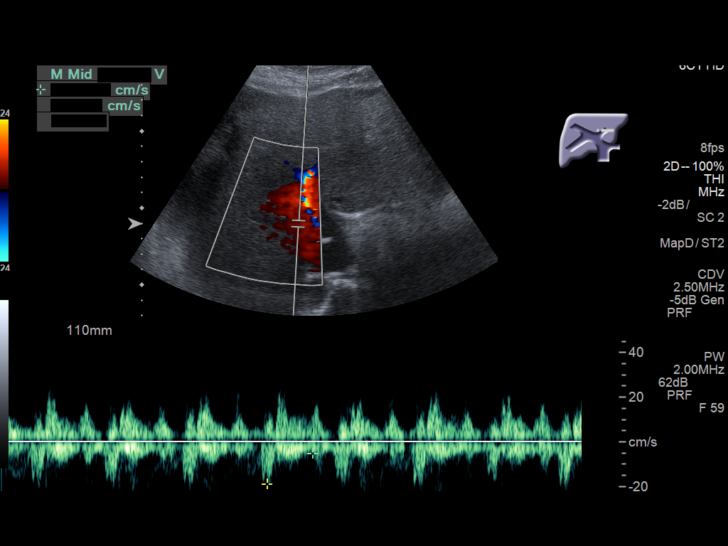
[im 24/36]
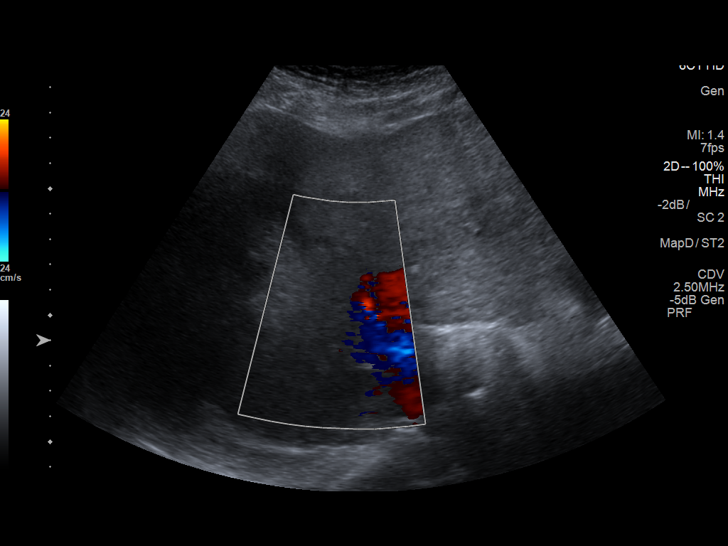
[im 27/36]
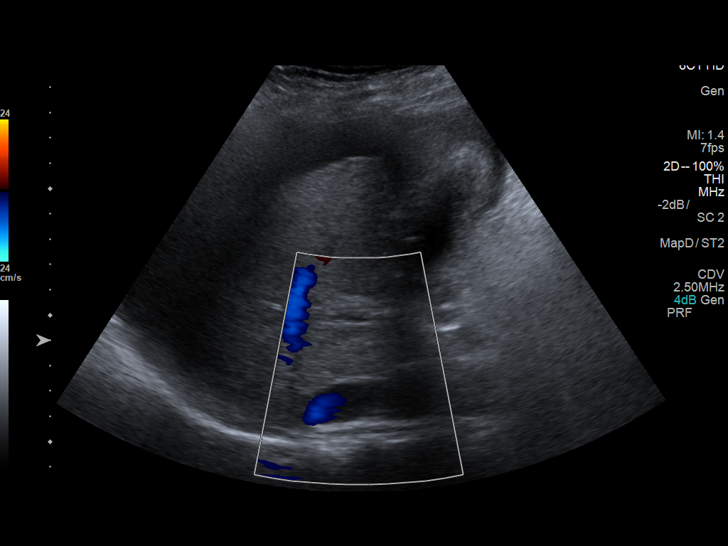
[im 30/36]
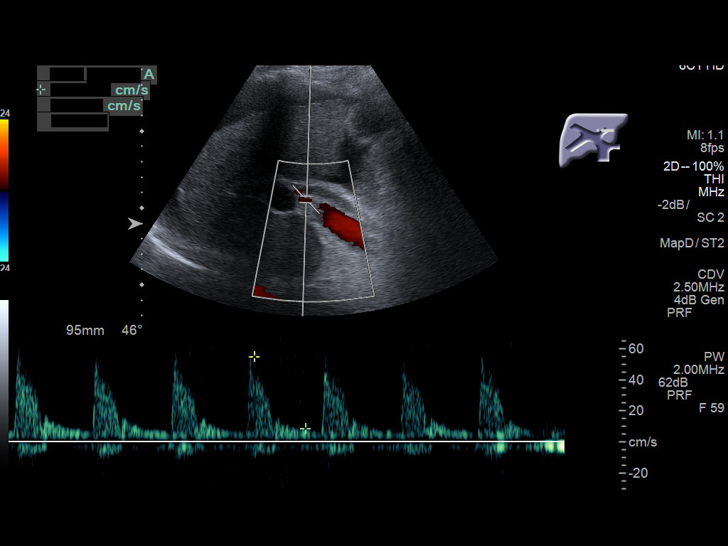
[im 33/36]
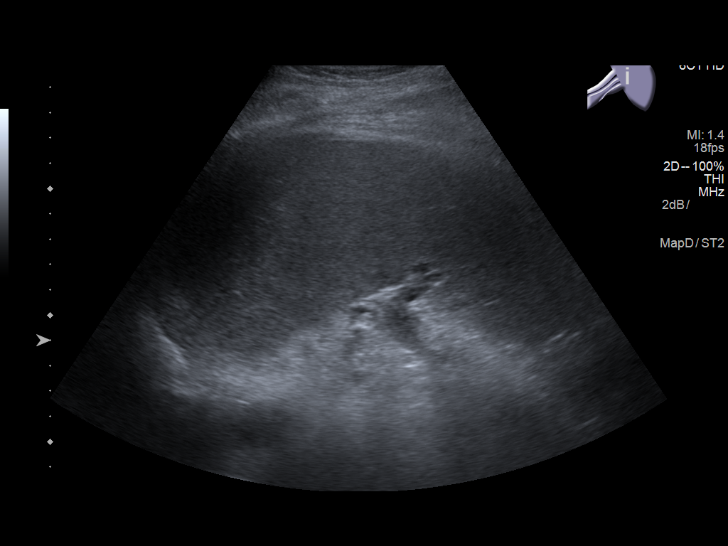
[im 36/36]
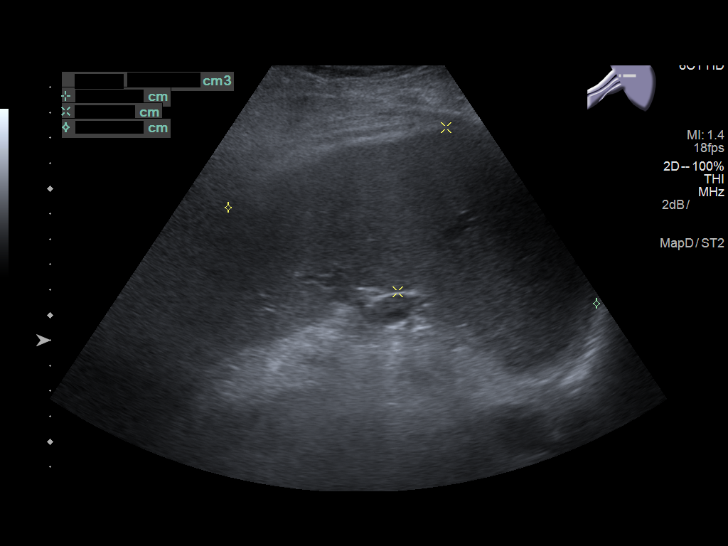

[14 of 25 positions shown; findings below may reference images not displayed]

FINDINGS: Portal Vein shows antegrade flow on color Doppler just beyond the
level of the portosplenic confluence, and is patent into the liver
although Doppler shows concurrent antegrade and retrograde flow
signals. No evidence of occlusion or thrombus. On grayscale, no
evidence of cavernous transformation of the portal vein. There is
hepatopetal flow in right and left branches.

Main:  11-16 cm/sec

Right:  4 cm/sec

Left:  10 cm/sec

Hepatic Vein Velocities (all hepatopetal)

Right:  23 cm/sec

Middle:  19 cm/sec

Left:    43 cm/sec

Intrahepatic IVC patent, 14 cm/sec.

Hepatic Artery Velocity:  55 cm/sec

Splenic Vein Velocity: 20 cm/sec. No evidence of occlusion or
thrombus. Spleen 17 x 7 x 15 cm (volume = 900 cm^3).

Varices: Not visualized

Ascites: Small, perihepatic
IMPRESSION: 1. Patent inflow and outflow vessels of the liver without occlusion
or thrombus.
2. Small amount of perihepatic ascites.
3. Splenomegaly

## 2018-05-13 ENCOUNTER — Other Ambulatory Visit: Payer: Self-pay | Admitting: Gastroenterology

## 2018-05-14 ENCOUNTER — Other Ambulatory Visit: Payer: Self-pay | Admitting: Family Medicine

## 2018-05-14 ENCOUNTER — Telehealth: Payer: Self-pay | Admitting: Family Medicine

## 2018-05-14 DIAGNOSIS — J069 Acute upper respiratory infection, unspecified: Secondary | ICD-10-CM

## 2018-05-14 MED ORDER — BENZONATATE 200 MG PO CAPS: 200.0000 mg | ORAL_CAPSULE | Freq: Two times a day (BID) | ORAL | 0 refills | Status: DC | PRN

## 2018-05-14 NOTE — Telephone Encounter (Signed)
Pt needing a refill on:  benzonatate (TESSALON) 200 MG capsule   Please fill at:  [place], [place] - [place] [phone] (Phone) [phone] (Fax)  Thanks, [employer]

## 2018-05-14 NOTE — Telephone Encounter (Signed)
Please Review

## 2018-05-14 NOTE — Telephone Encounter (Signed)
Done. Advise patient to recheck in the office if cough and congestion persists in 4-5 days or fever develops.

## 2018-05-14 NOTE — Telephone Encounter (Signed)
Patient advised.

## 2018-05-15 ENCOUNTER — Other Ambulatory Visit: Payer: Self-pay | Admitting: Gastroenterology

## 2018-05-16 ENCOUNTER — Telehealth: Payer: Self-pay | Admitting: Gastroenterology

## 2018-05-16 NOTE — Telephone Encounter (Signed)
Patient called & wanted to know when her next appointment was with Dr Marius Ditch. Per Dr Verlin Grills office note  In 03-2018 patient needs to return for a repeat EGD in next 2 to 3 months. She would ike an am appointment 1st thing.

## 2018-05-17 ENCOUNTER — Other Ambulatory Visit: Payer: Self-pay | Admitting: Gastroenterology

## 2018-05-20 ENCOUNTER — Other Ambulatory Visit: Payer: Self-pay

## 2018-05-20 DIAGNOSIS — I8500 Esophageal varices without bleeding: Secondary | ICD-10-CM

## 2018-05-20 NOTE — Telephone Encounter (Signed)
Spoke with pt and procedure for EGD w/banding has been scheduled for 06/20/18, pt verbalized understanding

## 2018-05-22 ENCOUNTER — Other Ambulatory Visit: Payer: Self-pay | Admitting: Gastroenterology

## 2018-05-22 DIAGNOSIS — S32511D Fracture of superior rim of right pubis, subsequent encounter for fracture with routine healing: Secondary | ICD-10-CM | POA: Diagnosis not present

## 2018-05-22 NOTE — Telephone Encounter (Signed)
Medications have been refilled and sent to pharmacy

## 2018-05-22 NOTE — Telephone Encounter (Signed)
Pt is calling for rx refill rx Pantotrazole sodium 40 mg and  Spironolactone 100 mg  [facility] [place]

## 2018-06-17 ENCOUNTER — Other Ambulatory Visit: Payer: Self-pay

## 2018-06-17 ENCOUNTER — Encounter: Payer: Self-pay | Admitting: Emergency Medicine

## 2018-06-17 ENCOUNTER — Emergency Department
Admission: EM | Admit: 2018-06-17 | Discharge: 2018-06-17 | Disposition: A | Discharge status: Home / Self Care | Payer: Medicare HMO | Attending: Emergency Medicine | Admitting: Emergency Medicine

## 2018-06-17 DIAGNOSIS — E119 Type 2 diabetes mellitus without complications: Secondary | ICD-10-CM | POA: Diagnosis not present

## 2018-06-17 DIAGNOSIS — S61452A Open bite of left hand, initial encounter: Secondary | ICD-10-CM | POA: Insufficient documentation

## 2018-06-17 DIAGNOSIS — Y999 Unspecified external cause status: Secondary | ICD-10-CM | POA: Insufficient documentation

## 2018-06-17 DIAGNOSIS — Z87891 Personal history of nicotine dependence: Secondary | ICD-10-CM | POA: Insufficient documentation

## 2018-06-17 DIAGNOSIS — Z79899 Other long term (current) drug therapy: Secondary | ICD-10-CM | POA: Insufficient documentation

## 2018-06-17 DIAGNOSIS — W540XXA Bitten by dog, initial encounter: Secondary | ICD-10-CM | POA: Diagnosis not present

## 2018-06-17 DIAGNOSIS — Y929 Unspecified place or not applicable: Secondary | ICD-10-CM | POA: Diagnosis not present

## 2018-06-17 DIAGNOSIS — Y939 Activity, unspecified: Secondary | ICD-10-CM | POA: Insufficient documentation

## 2018-06-17 DIAGNOSIS — Z23 Encounter for immunization: Secondary | ICD-10-CM | POA: Insufficient documentation

## 2018-06-17 DIAGNOSIS — J45909 Unspecified asthma, uncomplicated: Secondary | ICD-10-CM | POA: Insufficient documentation

## 2018-06-17 DIAGNOSIS — I10 Essential (primary) hypertension: Secondary | ICD-10-CM | POA: Insufficient documentation

## 2018-06-17 MED ORDER — DOXYCYCLINE HYCLATE 100 MG PO TABS
100.0000 mg | ORAL_TABLET | Freq: Once | ORAL | Status: AC
  Administered 2018-06-17: 100 mg via ORAL
  Filled 2018-06-17: qty 1

## 2018-06-17 MED ORDER — TETANUS-DIPHTH-ACELL PERTUSSIS 5-2.5-18.5 LF-MCG/0.5 IM SUSP
0.5000 mL | Freq: Once | INTRAMUSCULAR | Status: AC
  Administered 2018-06-17: 0.5 mL via INTRAMUSCULAR
  Filled 2018-06-17: qty 0.5

## 2018-06-17 MED ORDER — DOXYCYCLINE HYCLATE 50 MG PO CAPS: 100.0000 mg | ORAL_CAPSULE | Freq: Two times a day (BID) | ORAL | 0 refills | Status: DC

## 2018-06-17 MED ORDER — BACITRACIN-NEOMYCIN-POLYMYXIN 400-5-5000 EX OINT
TOPICAL_OINTMENT | Freq: Once | CUTANEOUS | Status: AC
  Administered 2018-06-17: 1 via TOPICAL
  Filled 2018-06-17: qty 1

## 2018-06-17 NOTE — ED Notes (Signed)
ED Provider at bedside. 

## 2018-06-17 NOTE — ED Provider Notes (Signed)
[facility] Emergency Department Provider Note  ____________________________________________  Time seen: Approximately 10:07 PM  I have reviewed the triage vital signs and the nursing notes.   HISTORY  Chief Complaint Animal Bite    HPI Jennifer [name] is a 78 y.o. female presents emergency department for evaluation of dog bite to left hand tonight.  Dog is patient's family dog.  Dog is up-to-date with rabies vaccinations.  Last tetanus shot was 9 years ago.  Patient is allergic to penicillin but does not recall her reaction.  She has not on any blood thinners.  Past Medical History:  Diagnosis Date  . Allergy   . Arthritis   . Asthma   . Colon polyp   . Fatty liver   . GERD (gastroesophageal reflux disease)   . Hyperlipidemia   . Hypertension   . Motion sickness    boats    Patient Active Problem List   Diagnosis Date Noted  . Essential hypertension, benign 02/04/2018  . Iron deficiency 02/04/2018  . Pelvic fracture ([place]) 01/31/2018  . Multiple gastric polyps   . Hx of colonic polyps   . Secondary esophageal varices without bleeding ([place])   . Acute upper GI bleed 07/12/2017  . Liver cirrhosis secondary to NASH ([place])   . Carpal tunnel syndrome on right 09/28/2016  . Obesity (BMI 30.0-34.9) 09/20/2016  . Upper respiratory infection 03/17/2016  . Thrombocytopenia ([place]) 12/29/2015  . Benign neoplasm of ascending colon   . Benign neoplasm of transverse colon   . Benign neoplasm of sigmoid colon   . Allergic rhinitis 10/14/2014  . Airway hyperreactivity 10/14/2014  . Controlled diabetes mellitus type II without complication ([place]) 50/07/7046  . Calculus of gallbladder 10/14/2014  . Esophagitis, reflux 10/14/2014  . Vitamin D deficiency 10/14/2014    Past Surgical History:  Procedure Laterality Date  . ABDOMINAL HYSTERECTOMY  1978  . COLONOSCOPY WITH PROPOFOL N/A 02/01/2015   Procedure: COLONOSCOPY WITH PROPOFOL;  Surgeon: Lucilla Lame, MD;   Location: [place];  Service: Endoscopy;  Laterality: N/A;  Diabetic - oral meds  . COLONOSCOPY WITH PROPOFOL N/A 11/22/2017   Procedure: COLONOSCOPY WITH PROPOFOL;  Surgeon: Lin Landsman, MD;  Location: [facility] ENDOSCOPY;  Service: Gastroenterology;  Laterality: N/A;  . ESOPHAGOGASTRODUODENOSCOPY (EGD) WITH PROPOFOL N/A 07/12/2017   Procedure: ESOPHAGOGASTRODUODENOSCOPY (EGD) WITH PROPOFOL;  Surgeon: Lin Landsman, MD;  Location: [facility] ENDOSCOPY;  Service: Gastroenterology;  Laterality: N/A;  . ESOPHAGOGASTRODUODENOSCOPY (EGD) WITH PROPOFOL N/A 08/16/2017   Procedure: ESOPHAGOGASTRODUODENOSCOPY (EGD) WITH PROPOFOL;  Surgeon: Lin Landsman, MD;  Location: [facility] ENDOSCOPY;  Service: Gastroenterology;  Laterality: N/A;  . ESOPHAGOGASTRODUODENOSCOPY (EGD) WITH PROPOFOL N/A 09/20/2017   Procedure: ESOPHAGOGASTRODUODENOSCOPY (EGD) WITH PROPOFOL;  Surgeon: Lin Landsman, MD;  Location: [facility] ENDOSCOPY;  Service: Gastroenterology;  Laterality: N/A;  . ESOPHAGOGASTRODUODENOSCOPY (EGD) WITH PROPOFOL N/A 11/01/2017   Procedure: ESOPHAGOGASTRODUODENOSCOPY (EGD) WITH PROPOFOL;  Surgeon: Lin Landsman, MD;  Location: [facility] ENDOSCOPY;  Service: Gastroenterology;  Laterality: N/A;  . ESOPHAGOGASTRODUODENOSCOPY (EGD) WITH PROPOFOL N/A 01/17/2018   Procedure: ESOPHAGOGASTRODUODENOSCOPY (EGD) WITH PROPOFOL;  Surgeon: Lin Landsman, MD;  Location: [facility] ENDOSCOPY;  Service: Gastroenterology;  Laterality: N/A;  . POLYPECTOMY  02/01/2015   Procedure: POLYPECTOMY;  Surgeon: Lucilla Lame, MD;  Location: [place];  Service: Endoscopy;;  . TONSILLECTOMY AND ADENOIDECTOMY      Prior to Admission medications   Medication Sig Start Date End Date Taking? Authorizing Provider  albuterol (PROVENTIL HFA;VENTOLIN HFA) 108 (90 Base) MCG/ACT inhaler Inhale  2 puffs into the lungs every 6 (six) hours as needed for wheezing or shortness of breath. 03/30/17   Chrismon, Vickki Muff, PA   benzonatate (TESSALON) 200 MG capsule Take 1 capsule (200 mg total) by mouth 2 (two) times daily as needed for cough. 05/14/18   Chrismon, Vickki Muff, PA  docusate sodium (COLACE) 100 MG capsule Take 1 capsule (100 mg total) by mouth 2 (two) times daily as needed for mild constipation or moderate constipation. Patient not taking: Reported on 04/09/2018 02/01/18   Nicholes Mango, MD  doxycycline (VIBRAMYCIN) 50 MG capsule Take 2 capsules (100 mg total) by mouth 2 (two) times daily for 10 days. 06/17/18 06/27/18  Laban Emperor, PA-C  ergocalciferol (VITAMIN D2) 50000 units capsule Take 50,000 Units by mouth once a week. Every Friday 02/01/18   [provider]  furosemide (LASIX) 20 MG tablet Give 1/2 tablet (51m) by mouth daily 02/13/18   [provider]  glucose blood (ACCU-CHEK AVIVA PLUS) test strip Test fasting blood sugar only once each morning. 04/25/18   Chrismon, DVickki Muff PA  IRON PO Take 45 mg by mouth once a week. Every Monday    [provider]  magnesium oxide (MAG-OX) 400 MG tablet Take 400 mg by mouth 2 (two) times daily.    [provider]  nadolol (CORGARD) 20 MG tablet Take 10 mg by mouth daily.  02/01/18   [provider]  Nutritional Supplements (NUTRITIONAL SUPPLEMENT PO) Diet Type: NAS (no added salt) diet, NCS    [provider]  pantoprazole (PROTONIX) 40 MG tablet Take 1 tablet (40 mg total) by mouth 2 (two) times daily. 05/22/18 07/21/18  VLin Landsman MD  predniSONE (DELTASONE) 5 MG tablet Take 1 tablet (5 mg total) by mouth daily with breakfast. Taper by one tablet daily starting at 6 tablets (6,5,4,3,2,1) 04/09/18   Chrismon, DVickki Muff PA  spironolactone (ALDACTONE) 100 MG tablet TAKE ONE TABLET EVERY DAY 05/22/18   Vanga, RTally Due MD  traMADol (ULTRAM) 50 MG tablet TAKE ONE TABLET BY MOUTH EVERY 6 HOURS AS NEEDED FOR MODERATE PAIN 03/14/18   Chrismon, DVickki Muff PA    Allergies Codeine; Hydrocodone-acetaminophen;  Nitrofurantoin; Nitrofurantoin monohyd macro; Penicillins; and Sulfa antibiotics  Family History  Problem Relation Age of Onset  . Dementia Mother   . Alcohol abuse Father   . Cancer Sister        lung  . Lymphoma Sister   . Lymphoma Sister     Social History Social History   Tobacco Use  . Smoking status: Former Smoker    Packs/day: 0.50    Years: 6.00    Pack years: 3.00    Types: Cigarettes    Last attempt to quit: 05/08/1987    Years since quitting: 31.1  . Smokeless tobacco: Never Used  Substance Use Topics  . Alcohol use: Never    Frequency: Never  . Drug use: Never     Review of Systems  Constitutional: No fever/chills Respiratory: No SOB. Gastrointestinal: No nausea, no vomiting.  Musculoskeletal: Positive for hand pain Skin: Negative for rash, ecchymosis.  Positive for abrasion. Neurological: Negative for headaches, numbness or tingling   ____________________________________________   PHYSICAL EXAM:  VITAL SIGNS: ED Triage Vitals  Enc Vitals Group     BP 06/17/18 2001 106/68     Pulse Rate 06/17/18 2001 63     Resp 06/17/18 2001 18     Temp 06/17/18 2001 98.3 F (36.8 C)  Temp Source 06/17/18 2001 Oral     SpO2 06/17/18 2001 97 %     Weight 06/17/18 1958 160 lb (72.6 kg)     Height 06/17/18 1958 5' 1"  (1.549 m)     Head Circumference --      Peak Flow --      Pain Score 06/17/18 1957 4     Pain Loc --      Pain Edu? --      Excl. in [place]? --      Constitutional: Alert and oriented. Well appearing and in no acute distress. Eyes: Conjunctivae are normal. PERRL. EOMI. Head: Atraumatic. ENT:      Ears:      Nose: No congestion/rhinnorhea.      Mouth/Throat: Mucous membranes are moist.  Neck: No stridor.  Cardiovascular: Normal rate, regular rhythm.  Good peripheral circulation. Respiratory: Normal respiratory effort without tachypnea or retractions. Lungs CTAB. Good air entry to the bases with no decreased or absent breath  sounds. Musculoskeletal: Full range of motion to all extremities. No gross deformities appreciated. Neurologic:  Normal speech and language. No gross focal neurologic deficits are appreciated.  Skin:  Skin is warm, dry.  Skin tear to left fourth knuckle. Psychiatric: Mood and affect are normal. Speech and behavior are normal. Patient exhibits appropriate insight and judgement.   ____________________________________________   LABS (all labs ordered are listed, but only abnormal results are displayed)  Labs Reviewed - No data to display ____________________________________________  EKG   ____________________________________________  RADIOLOGY  No results found.  ____________________________________________    PROCEDURES  Procedure(s) performed:    Procedures    Medications  Tdap (BOOSTRIX) injection 0.5 mL (0.5 mLs Intramuscular Given 06/17/18 2200)  neomycin-bacitracin-polymyxin (NEOSPORIN) ointment packet (1 application Topical Given 06/17/18 2200)  doxycycline (VIBRA-TABS) tablet 100 mg (100 mg Oral Given 06/17/18 2200)     ____________________________________________   INITIAL IMPRESSION / ASSESSMENT AND PLAN / ED COURSE  Pertinent labs & imaging results that were available during my care of the patient were reviewed by me and considered in my medical decision making (see chart for details).  Review of the [place] CSRS was performed in accordance of the [place] prior to dispensing any controlled drugs.     Patient presented to the emergency department for evaluation of dog bite.  Vital signs and exam are reassuring.  No indication for sutures or repair at this time.  Wound was cleaned and soaked with normal saline and iodine.  Wound was dressed and finger splint was placed so that patient is unable to bend at the knuckle so that wound would not continue to bleed.  Tetanus shot was updated.  Patient was given a dose of doxycycline.  She is allergic to penicillins.   Patient will be discharged home with prescriptions for doxycycline. Patient is to follow up with primary care as directed. Patient is given ED precautions to return to the ED for any worsening or new symptoms.     ____________________________________________  FINAL CLINICAL IMPRESSION(S) / ED DIAGNOSES  Final diagnoses:  Dog bite, initial encounter      NEW MEDICATIONS STARTED DURING THIS VISIT:  ED Discharge Orders         Ordered    doxycycline (VIBRAMYCIN) 50 MG capsule  2 times daily     06/17/18 2207              This chart was dictated using voice recognition software/Dragon. Despite best efforts to proofread, errors can occur which  can change the meaning. Any change was purely unintentional.    Laban Emperor, PA-C 06/17/18 [place], [place], MD 06/17/18 2325

## 2018-06-17 NOTE — ED Triage Notes (Signed)
Pt presents to ED with dog bite to her left hand. Pt states she was sitting on the couch and her personal dog bit her hand. Pt states she thinks she was jealous duke to her other dog sitting next to her.

## 2018-06-18 ENCOUNTER — Telehealth: Payer: Self-pay | Admitting: Gastroenterology

## 2018-06-18 NOTE — Telephone Encounter (Signed)
Patient called & states she was bitten by her dog on 06-17-18 and went to the ED. She was treated for the this & was questioning if it was still ok to have the Upper Endoscopy scheduled with Dr Marius Ditch on 06-20-2018. Please call & advise.

## 2018-06-18 NOTE — Telephone Encounter (Signed)
Patient called checking on her call from earlier this am. I spoke to [place] and she was on another phone call but would call the patient back shortly.

## 2018-06-19 ENCOUNTER — Ambulatory Visit (INDEPENDENT_AMBULATORY_CARE_PROVIDER_SITE_OTHER): Payer: Medicare HMO | Admitting: Physician Assistant

## 2018-06-19 ENCOUNTER — Encounter: Payer: Self-pay | Admitting: Physician Assistant

## 2018-06-19 VITALS — BP 102/67 | HR 57 | Temp 97.5°F | Resp 16 | Wt 166.2 lb

## 2018-06-19 DIAGNOSIS — S61402A Unspecified open wound of left hand, initial encounter: Secondary | ICD-10-CM

## 2018-06-19 DIAGNOSIS — W540XXD Bitten by dog, subsequent encounter: Secondary | ICD-10-CM | POA: Diagnosis not present

## 2018-06-19 MED ORDER — AMOXICILLIN-POT CLAVULANATE 500-125 MG PO TABS: 1.0000 | ORAL_TABLET | Freq: Three times a day (TID) | ORAL | 0 refills | Status: DC

## 2018-06-19 NOTE — Patient Instructions (Signed)
Animal Bite, Adult Animal bite wounds can be mild or serious. It is important to get medical treatment to prevent infection. Ask your doctor if you need treatment to prevent an infection that can spread from animals to humans (rabies). Follow these instructions at home: Wound care   Follow instructions from your doctor about how to take care of your wound. Make sure you: ? Wash your hands with soap and water before you change your bandage (dressing). If you cannot use soap and water, use hand sanitizer. ? Change your bandage as told by your doctor. ? Leave stitches (sutures), skin glue, or skin tape (adhesive) strips in place. They may need to stay in place for 2 weeks or longer. If tape strips get loose and curl up, you may trim the loose edges. Do not remove tape strips completely unless your doctor says it is okay.  Check your wound every day for signs of infection. Check for: ? More redness, swelling, or pain. ? More fluid or blood. ? Warmth. ? Pus or a bad smell. Medicines  Take or apply over-the-counter and prescription medicines only as told by your doctor.  If you were prescribed an antibiotic, take or apply it as told by your doctor. Do not stop using the antibiotic even if your wound gets better. General instructions   Keep the injured area raised (elevated) above the level of your heart while you are sitting or lying down.  If directed, put ice on the injured area. ? Put ice in a plastic bag. ? Place a towel between your skin and the bag. ? Leave the ice on for 20 minutes, 2-3 times per day.  Keep all follow-up visits as told by your doctor. This is important. Contact a doctor if:  You have more redness, swelling, or pain around your wound.  Your wound feels warm to the touch.  You have a fever or chills.  You have a general feeling of sickness (malaise).  You feel sick to your stomach (nauseous).  You throw up (vomit).  You have pain that does not get  better. Get help right away if:  You have a red streak going away from your wound.  You have any of these coming from your wound: ? Non-clear fluid. ? More blood. ? Pus or a bad smell.  You have trouble moving your injured area.  You lose feeling (have numbness) or feel tingling anywhere on your body. Summary  It is important to get the right medical treatment for animal bites. Treatment can help you to not get an infection. Ask your doctor if you need treatment to prevent an infection that can spread from animals to humans (rabies).  Check your wound every day for signs of infection, such as more redness or swelling instead of less.  If you have a red streak going away from your wound, get medical help right away. This information is not intended to replace advice given to you by your health care provider. Make sure you discuss any questions you have with your health care provider. Document Released: 04/24/2005 Document Revised: 11/02/2016 Document Reviewed: 11/02/2016 Elsevier Interactive Patient Education  2019 Elsevier Inc.  

## 2018-06-19 NOTE — Progress Notes (Signed)
Patient: Jennifer [name] Female    DOB: 1940-05-29   78 y.o.   MRN: [id number] Visit Date: 06/19/2018  Today's Provider: Mar Daring, PA-C   Chief Complaint  Patient presents with  . Hospitalization Follow-up   Subjective:     HPI   Follow Up ER Visit  Patient is here for ER follow up.  She was recently seen at [facility] for dog bite on 06/17/2018. Treatment for this included Tdap given 06/17/2018 and Neosporin ointment packet and Doxycycline 50 mg tablet. Take 2 tabs BID.  She reports excellent compliance with treatment. She reports this condition is Improved.  ------------------------------------------------------------------------------------   Allergies  Allergen Reactions  . Codeine Other (See Comments)    Pt denies  . Hydrocodone-Acetaminophen Other (See Comments)    Pt denies  . Nitrofurantoin Other (See Comments)  . Nitrofurantoin Monohyd Macro     "Sugar got high"  . Penicillins     Has patient had a PCN reaction causing immediate rash, facial/tongue/throat swelling, SOB or lightheadedness with hypotension: Unknown Has patient had a PCN reaction causing severe rash involving mucus membranes or skin necrosis: Unknown Has patient had a PCN reaction that required hospitalization: Unknown Has patient had a PCN reaction occurring within the last 10 years: Unknown If all of the above answers are "NO", then may proceed with Cephalosporin use.  . Sulfa Antibiotics Other (See Comments)     Current Outpatient Medications:  .  albuterol (PROVENTIL HFA;VENTOLIN HFA) 108 (90 Base) MCG/ACT inhaler, Inhale 2 puffs into the lungs every 6 (six) hours as needed for wheezing or shortness of breath., Disp: 1 Inhaler, Rfl: 0 .  doxycycline (VIBRAMYCIN) 50 MG capsule, Take 2 capsules (100 mg total) by mouth 2 (two) times daily for 10 days., Disp: 40 capsule, Rfl: 0 .  furosemide (LASIX) 20 MG tablet, Give 1/2 tablet (86m) by mouth daily, Disp: , Rfl:  .  nadolol  (CORGARD) 20 MG tablet, Take 10 mg by mouth daily. , Disp: , Rfl:  .  pantoprazole (PROTONIX) 40 MG tablet, Take 1 tablet (40 mg total) by mouth 2 (two) times daily., Disp: 120 tablet, Rfl: 0 .  spironolactone (ALDACTONE) 100 MG tablet, TAKE ONE TABLET EVERY DAY, Disp: 30 tablet, Rfl: 0 .  docusate sodium (COLACE) 100 MG capsule, Take 1 capsule (100 mg total) by mouth 2 (two) times daily as needed for mild constipation or moderate constipation. (Patient not taking: Reported on 04/09/2018), Disp: 10 capsule, Rfl: 0 .  ergocalciferol (VITAMIN D2) 50000 units capsule, Take 50,000 Units by mouth once a week. Every Friday, Disp: , Rfl:  .  glucose blood (ACCU-CHEK AVIVA PLUS) test strip, Test fasting blood sugar only once each morning., Disp: 100 each, Rfl: 3 .  IRON PO, Take 45 mg by mouth once a week. Every Monday, Disp: , Rfl:  .  magnesium oxide (MAG-OX) 400 MG tablet, Take 400 mg by mouth 2 (two) times daily., Disp: , Rfl:  .  Nutritional Supplements (NUTRITIONAL SUPPLEMENT PO), Diet Type: NAS (no added salt) diet, NCS, Disp: , Rfl:  .  traMADol (ULTRAM) 50 MG tablet, TAKE ONE TABLET BY MOUTH EVERY 6 HOURS AS NEEDED FOR MODERATE PAIN (Patient not taking: Reported on 06/19/2018), Disp: 30 tablet, Rfl: 0  Review of Systems  Constitutional: Negative.   Respiratory: Negative.   Cardiovascular: Negative.   Skin: Positive for wound.  Neurological: Negative.     Social History   Tobacco Use  . Smoking  status: Former Smoker    Packs/day: 0.50    Years: 6.00    Pack years: 3.00    Types: Cigarettes    Last attempt to quit: 05/08/1987    Years since quitting: 31.1  . Smokeless tobacco: Never Used  Substance Use Topics  . Alcohol use: Never    Frequency: Never      Objective:   BP 102/67 (BP Location: Left Arm, Patient Position: Sitting, Cuff Size: Large)   Pulse (!) 57   Temp (!) 97.5 F (36.4 C) (Oral)   Resp 16   Wt 166 lb 3.2 oz (75.4 kg)   BMI 31.40 kg/m  Vitals:   06/19/18 1503   BP: 102/67  Pulse: (!) 57  Resp: 16  Temp: (!) 97.5 F (36.4 C)  TempSrc: Oral  Weight: 166 lb 3.2 oz (75.4 kg)     Physical Exam Vitals signs reviewed.  Constitutional:      Appearance: She is well-developed.  HENT:     Head: Normocephalic and atraumatic.  Neck:     Musculoskeletal: Normal range of motion and neck supple.  Pulmonary:     Effort: Pulmonary effort is normal. No respiratory distress.  Skin:    General: Skin is warm and dry.     Findings: Erythema and wound present.       Psychiatric:        Mood and Affect: Mood normal.        Behavior: Behavior normal.        Thought Content: Thought content normal.        Judgment: Judgment normal.         Assessment & Plan    1. Dog bite, subsequent encounter Change from doxycycline to augmentin as below. Patient reports she has been told she had a PCN allergy but does not recall any reaction and states she has taken amoxil without issues. Advised to wash wound twice daily with soap and water only. May apply dry dressing prn for drainage. Call if worsening.  - amoxicillin-clavulanate (AUGMENTIN) 500-125 MG tablet; Take 1 tablet (500 mg total) by mouth 3 (three) times daily.  Dispense: 30 tablet; Refill: 0     Mar Daring, PA-C  [facility]

## 2018-06-19 NOTE — Telephone Encounter (Signed)
Patient has been advised she can still have her upper endoscopy despite the dog bite.   Her dog bit her on the top of her hand two nights ago and she went to ER and was prescribed doxycycline.  Thanks [employer]

## 2018-06-20 ENCOUNTER — Ambulatory Visit
Admission: RE | Admit: 2018-06-20 | Discharge: 2018-06-20 | Disposition: A | Discharge status: Home / Self Care | Payer: Medicare HMO | Source: Ambulatory Visit | Attending: Gastroenterology | Admitting: Gastroenterology

## 2018-06-20 ENCOUNTER — Ambulatory Visit: Payer: Medicare HMO | Admitting: Anesthesiology

## 2018-06-20 ENCOUNTER — Encounter: Payer: Self-pay | Admitting: Student

## 2018-06-20 ENCOUNTER — Other Ambulatory Visit: Payer: Self-pay

## 2018-06-20 ENCOUNTER — Encounter
Admission: RE | Disposition: A | Discharge status: Home / Self Care | Payer: Self-pay | Source: Ambulatory Visit | Attending: Gastroenterology

## 2018-06-20 DIAGNOSIS — K210 Gastro-esophageal reflux disease with esophagitis: Secondary | ICD-10-CM | POA: Diagnosis not present

## 2018-06-20 DIAGNOSIS — K766 Portal hypertension: Secondary | ICD-10-CM | POA: Insufficient documentation

## 2018-06-20 DIAGNOSIS — Z880 Allergy status to penicillin: Secondary | ICD-10-CM | POA: Diagnosis not present

## 2018-06-20 DIAGNOSIS — Z6830 Body mass index (BMI) 30.0-30.9, adult: Secondary | ICD-10-CM | POA: Diagnosis not present

## 2018-06-20 DIAGNOSIS — F1099 Alcohol use, unspecified with unspecified alcohol-induced disorder: Secondary | ICD-10-CM | POA: Diagnosis not present

## 2018-06-20 DIAGNOSIS — J45909 Unspecified asthma, uncomplicated: Secondary | ICD-10-CM | POA: Diagnosis not present

## 2018-06-20 DIAGNOSIS — I8500 Esophageal varices without bleeding: Secondary | ICD-10-CM

## 2018-06-20 DIAGNOSIS — Z87891 Personal history of nicotine dependence: Secondary | ICD-10-CM | POA: Insufficient documentation

## 2018-06-20 DIAGNOSIS — I10 Essential (primary) hypertension: Secondary | ICD-10-CM | POA: Insufficient documentation

## 2018-06-20 DIAGNOSIS — Z885 Allergy status to narcotic agent status: Secondary | ICD-10-CM | POA: Insufficient documentation

## 2018-06-20 DIAGNOSIS — E119 Type 2 diabetes mellitus without complications: Secondary | ICD-10-CM | POA: Insufficient documentation

## 2018-06-20 DIAGNOSIS — Z882 Allergy status to sulfonamides status: Secondary | ICD-10-CM | POA: Insufficient documentation

## 2018-06-20 DIAGNOSIS — K3189 Other diseases of stomach and duodenum: Secondary | ICD-10-CM | POA: Diagnosis not present

## 2018-06-20 DIAGNOSIS — Z79899 Other long term (current) drug therapy: Secondary | ICD-10-CM | POA: Diagnosis not present

## 2018-06-20 DIAGNOSIS — I8510 Secondary esophageal varices without bleeding: Secondary | ICD-10-CM | POA: Diagnosis not present

## 2018-06-20 DIAGNOSIS — K219 Gastro-esophageal reflux disease without esophagitis: Secondary | ICD-10-CM | POA: Diagnosis not present

## 2018-06-20 DIAGNOSIS — E785 Hyperlipidemia, unspecified: Secondary | ICD-10-CM | POA: Insufficient documentation

## 2018-06-20 DIAGNOSIS — E669 Obesity, unspecified: Secondary | ICD-10-CM | POA: Diagnosis not present

## 2018-06-20 HISTORY — PX: ESOPHAGOGASTRODUODENOSCOPY (EGD) WITH PROPOFOL: SHX5813

## 2018-06-20 SURGERY — ESOPHAGOGASTRODUODENOSCOPY (EGD) WITH PROPOFOL
Anesthesia: General

## 2018-06-20 MED ORDER — SODIUM CHLORIDE 0.9 % IV SOLN
INTRAVENOUS | Status: DC
  Administered 2018-06-20: 1000 mL via INTRAVENOUS

## 2018-06-20 MED ORDER — NADOLOL 20 MG PO TABS: 20.0000 mg | ORAL_TABLET | Freq: Every day | ORAL | 0 refills | Status: DC

## 2018-06-20 MED ORDER — PROPOFOL 10 MG/ML IV BOLUS
INTRAVENOUS | Status: DC | PRN
  Administered 2018-06-20: 30 mg via INTRAVENOUS
  Administered 2018-06-20: 80 mg via INTRAVENOUS

## 2018-06-20 MED ORDER — PROPOFOL 500 MG/50ML IV EMUL
INTRAVENOUS | Status: AC
  Filled 2018-06-20: qty 50

## 2018-06-20 MED ORDER — PHENYLEPHRINE HCL 10 MG/ML IJ SOLN
INTRAMUSCULAR | Status: AC
  Filled 2018-06-20: qty 1

## 2018-06-20 NOTE — Anesthesia Post-op Follow-up Note (Signed)
Anesthesia QCDR form completed.        

## 2018-06-20 NOTE — Transfer of Care (Signed)
Immediate Anesthesia Transfer of Care Note  Patient: Jennifer [name]  Procedure(s) Performed: ESOPHAGOGASTRODUODENOSCOPY (EGD) WITH PROPOFOL (N/A )  Patient Location: Endoscopy Unit  Anesthesia Type:General  Level of Consciousness: drowsy and patient cooperative  Airway & Oxygen Therapy: Patient Spontanous Breathing and Patient connected to nasal cannula oxygen  Post-op Assessment: Report given to RN and Post -op Vital signs reviewed and stable  Post vital signs: Reviewed and stable  Last Vitals:  Vitals Value Taken Time  BP 125/71 06/20/2018  9:03 AM  Temp 36.4 C 06/20/2018  9:03 AM  Pulse 68 06/20/2018  9:04 AM  Resp 25 06/20/2018  9:04 AM  SpO2 93 % 06/20/2018  9:04 AM  Vitals shown include unvalidated device data.  Last Pain:  Vitals:   06/20/18 0903  TempSrc: Tympanic  PainSc: Asleep         Complications: No apparent anesthesia complications

## 2018-06-20 NOTE — H&P (Signed)
Cephas Darby, MD [address]  [place]  [place], [place] [address]  Main: [phone]  Fax: [phone] Pager: [phone]  Primary Care Physician:  Mar Daring, PA-C Primary Gastroenterologist:  Dr. Cephas Darby  Pre-Procedure History & Physical: HPI:  Jennifer [name] is a 78 y.o. female is here for an endoscopy.   Past Medical History:  Diagnosis Date  . Allergy   . Arthritis   . Asthma   . Colon polyp   . Fatty liver   . GERD (gastroesophageal reflux disease)   . Hyperlipidemia   . Hypertension   . Motion sickness    boats    Past Surgical History:  Procedure Laterality Date  . ABDOMINAL HYSTERECTOMY  1978  . COLONOSCOPY WITH PROPOFOL N/A 02/01/2015   Procedure: COLONOSCOPY WITH PROPOFOL;  Surgeon: Lucilla Lame, MD;  Location: [place];  Service: Endoscopy;  Laterality: N/A;  Diabetic - oral meds  . COLONOSCOPY WITH PROPOFOL N/A 11/22/2017   Procedure: COLONOSCOPY WITH PROPOFOL;  Surgeon: Lin Landsman, MD;  Location: [facility] ENDOSCOPY;  Service: Gastroenterology;  Laterality: N/A;  . ESOPHAGOGASTRODUODENOSCOPY (EGD) WITH PROPOFOL N/A 07/12/2017   Procedure: ESOPHAGOGASTRODUODENOSCOPY (EGD) WITH PROPOFOL;  Surgeon: Lin Landsman, MD;  Location: [facility] ENDOSCOPY;  Service: Gastroenterology;  Laterality: N/A;  . ESOPHAGOGASTRODUODENOSCOPY (EGD) WITH PROPOFOL N/A 08/16/2017   Procedure: ESOPHAGOGASTRODUODENOSCOPY (EGD) WITH PROPOFOL;  Surgeon: Lin Landsman, MD;  Location: [facility] ENDOSCOPY;  Service: Gastroenterology;  Laterality: N/A;  . ESOPHAGOGASTRODUODENOSCOPY (EGD) WITH PROPOFOL N/A 09/20/2017   Procedure: ESOPHAGOGASTRODUODENOSCOPY (EGD) WITH PROPOFOL;  Surgeon: Lin Landsman, MD;  Location: [facility] ENDOSCOPY;  Service: Gastroenterology;  Laterality: N/A;  . ESOPHAGOGASTRODUODENOSCOPY (EGD) WITH PROPOFOL N/A 11/01/2017   Procedure: ESOPHAGOGASTRODUODENOSCOPY (EGD) WITH PROPOFOL;  Surgeon: Lin Landsman, MD;  Location:  [facility] ENDOSCOPY;  Service: Gastroenterology;  Laterality: N/A;  . ESOPHAGOGASTRODUODENOSCOPY (EGD) WITH PROPOFOL N/A 01/17/2018   Procedure: ESOPHAGOGASTRODUODENOSCOPY (EGD) WITH PROPOFOL;  Surgeon: Lin Landsman, MD;  Location: [facility] ENDOSCOPY;  Service: Gastroenterology;  Laterality: N/A;  . POLYPECTOMY  02/01/2015   Procedure: POLYPECTOMY;  Surgeon: Lucilla Lame, MD;  Location: [place];  Service: Endoscopy;;  . TONSILLECTOMY AND ADENOIDECTOMY      Prior to Admission medications   Medication Sig Start Date End Date Taking? Authorizing Provider  amoxicillin-clavulanate (AUGMENTIN) 500-125 MG tablet Take 1 tablet (500 mg total) by mouth 3 (three) times daily. 06/19/18  Yes Mar Daring, PA-C  docusate sodium (COLACE) 100 MG capsule Take 1 capsule (100 mg total) by mouth 2 (two) times daily as needed for mild constipation or moderate constipation. 02/01/18  Yes Gouru, Illene Silver, MD  furosemide (LASIX) 20 MG tablet Give 1/2 tablet (58m) by mouth daily 02/13/18  Yes [provider]  nadolol (CORGARD) 20 MG tablet Take 10 mg by mouth daily.  02/01/18  Yes [provider]  pantoprazole (PROTONIX) 40 MG tablet Take 1 tablet (40 mg total) by mouth 2 (two) times daily. 05/22/18 07/21/18 Yes Jakelin Taussig, RTally Due MD  spironolactone (ALDACTONE) 100 MG tablet TAKE ONE TABLET EVERY DAY 05/22/18  Yes Genifer Lazenby, RTally Due MD  albuterol (PROVENTIL HFA;VENTOLIN HFA) 108 (90 Base) MCG/ACT inhaler Inhale 2 puffs into the lungs every 6 (six) hours as needed for wheezing or shortness of breath. 03/30/17   Chrismon, DVickki Muff PA  ergocalciferol (VITAMIN D2) 50000 units capsule Take 50,000 Units by mouth once a week. Every Friday 02/01/18   [provider]  glucose blood (ACCU-CHEK AVIVA PLUS) test strip Test  fasting blood sugar only once each morning. 04/25/18   Chrismon, Vickki Muff, PA  IRON PO Take 45 mg by mouth once a week. Every Monday    [provider]  magnesium  oxide (MAG-OX) 400 MG tablet Take 400 mg by mouth 2 (two) times daily.    [provider]  Nutritional Supplements (NUTRITIONAL SUPPLEMENT PO) Diet Type: NAS (no added salt) diet, NCS    [provider]  traMADol (ULTRAM) 50 MG tablet TAKE ONE TABLET BY MOUTH EVERY 6 HOURS AS NEEDED FOR MODERATE PAIN Patient not taking: Reported on 06/19/2018 03/14/18   Chrismon, Vickki Muff, PA    Allergies as of 05/20/2018 - Review Complete 04/09/2018  Allergen Reaction Noted  . Codeine Other (See Comments) 10/14/2014  . Hydrocodone-acetaminophen Other (See Comments) 10/14/2014  . Nitrofurantoin Other (See Comments) 11/05/2014  . Nitrofurantoin monohyd macro  10/14/2014  . Penicillins  10/14/2014  . Sulfa antibiotics Other (See Comments) 10/14/2014    Family History  Problem Relation Age of Onset  . Dementia Mother   . Alcohol abuse Father   . Cancer Sister        lung  . Lymphoma Sister   . Lymphoma Sister     Social History   Socioeconomic History  . Marital status: Widowed    Spouse name: Not on file  . Number of children: 2  . Years of education: Not on file  . Highest education level: Not on file  Occupational History  . Occupation: retired  Scientific laboratory technician  . Financial resource strain: Not hard at all  . Food insecurity:    Worry: Never true    Inability: Never true  . Transportation needs:    Medical: No    Non-medical: No  Tobacco Use  . Smoking status: Former Smoker    Packs/day: 0.50    Years: 6.00    Pack years: 3.00    Types: Cigarettes    Last attempt to quit: 05/08/1987    Years since quitting: 31.1  . Smokeless tobacco: Never Used  Substance and Sexual Activity  . Alcohol use: Never    Frequency: Never  . Drug use: Never  . Sexual activity: Not on file  Lifestyle  . Physical activity:    Days per week: Not on file    Minutes per session: Not on file  . Stress: Not at all  Relationships  . Social connections:    Talks on phone: Not on file     Gets together: Not on file    Attends religious service: Not on file    Active member of club or organization: Not on file    Attends meetings of clubs or organizations: Not on file    Relationship status: Not on file  . Intimate partner violence:    Fear of current or ex partner: Not on file    Emotionally abused: Not on file    Physically abused: Not on file    Forced sexual activity: Not on file  Other Topics Concern  . Not on file  Social History Narrative  . Not on file    Review of Systems: See HPI, otherwise negative ROS  Physical Exam: BP 125/72   Pulse 70   Temp (!) 97.1 F (36.2 C) (Tympanic)   Resp 18   Ht 5' 1"  (1.549 m)   Wt 72.6 kg   SpO2 98%   BMI 30.23 kg/m  General:   Alert,  pleasant and cooperative in NAD Head:  Normocephalic and atraumatic. Neck:  Supple; no masses or thyromegaly. Lungs:  Clear throughout to auscultation.    Heart:  Regular rate and rhythm. Abdomen:  Soft, nontender and nondistended. Normal bowel sounds, without guarding, and without rebound.   Neurologic:  Alert and  oriented x4;  grossly normal neurologically.  Impression/Plan: Jennifer [name] is here for an endoscopy to be performed for variceal surveillance  Risks, benefits, limitations, and alternatives regarding  endoscopy have been reviewed with the patient.  Questions have been answered.  All parties agreeable.   Sherri Sear, MD  06/20/2018, 8:26 AM

## 2018-06-20 NOTE — Anesthesia Postprocedure Evaluation (Signed)
Anesthesia Post Note  Patient: Jennifer [name]  Procedure(s) Performed: ESOPHAGOGASTRODUODENOSCOPY (EGD) WITH PROPOFOL (N/A )  Patient location during evaluation: Endoscopy Anesthesia Type: General Level of consciousness: awake and alert Pain management: pain level controlled Vital Signs Assessment: post-procedure vital signs reviewed and stable Respiratory status: spontaneous breathing, nonlabored ventilation, respiratory function stable and patient connected to nasal cannula oxygen Cardiovascular status: blood pressure returned to baseline and stable Postop Assessment: no apparent nausea or vomiting Anesthetic complications: no     Last Vitals:  Vitals:   06/20/18 0825 06/20/18 0903  BP: 125/72 125/71  Pulse: 70 73  Resp: 18 (!) 24  Temp: (!) 36.2 C (!) 36.4 C  SpO2: 98% 92%    Last Pain:  Vitals:   06/20/18 0933  TempSrc:   PainSc: 0-No pain                 Martha Clan

## 2018-06-20 NOTE — Op Note (Signed)
[facility]astroenterology Patient Name: Jennifer [name] Procedure Date: 06/20/2018 8:46 AM MRN: [id number] Account #: [id number] Date of Birth: [birth year] Admit Type: Outpatient Age: 78 Room: [facility] ENDO ROOM 3 Gender: Female Note Status: Finalized Procedure:            Upper GI endoscopy Indications:          Follow-up of esophageal varices Providers:            Lin Landsman MD, MD Referring MD:         Mar Daring (Referring MD) Medicines:            Monitored Anesthesia Care Complications:        No immediate complications. Estimated blood loss: None. Procedure:            Pre-Anesthesia Assessment:                       - Prior to the procedure, a History and Physical was                        performed, and patient medications and allergies were                        reviewed. The patient is competent. The risks and                        benefits of the procedure and the sedation options and                        risks were discussed with the patient. All questions                        were answered and informed consent was obtained.                        Patient identification and proposed procedure were                        verified by the physician, the nurse, the                        anesthesiologist, the anesthetist and the technician in                        the pre-procedure area in the procedure room in the                        endoscopy suite. Mental Status Examination: alert and                        oriented. Airway Examination: normal oropharyngeal                        airway and neck mobility. Respiratory Examination:                        clear to auscultation. CV Examination: normal.                        Prophylactic Antibiotics: The patient does not require  prophylactic antibiotics. Prior Anticoagulants: The                        patient has taken no previous anticoagulant or              antiplatelet agents. ASA Grade Assessment: III - A                        patient with severe systemic disease. After reviewing                        the risks and benefits, the patient was deemed in                        satisfactory condition to undergo the procedure. The                        anesthesia plan was to use monitored anesthesia care                        (MAC). Immediately prior to administration of                        medications, the patient was re-assessed for adequacy                        to receive sedatives. The heart rate, respiratory rate,                        oxygen saturations, blood pressure, adequacy of                        pulmonary ventilation, and response to care were                        monitored throughout the procedure. The physical status                        of the patient was re-assessed after the procedure.                       After obtaining informed consent, the endoscope was                        passed under direct vision. Throughout the procedure,                        the patient's blood pressure, pulse, and oxygen                        saturations were monitored continuously. The Endoscope                        was introduced through the mouth, and advanced to the                        second part of duodenum. The upper GI endoscopy was                        accomplished without difficulty. The patient tolerated  the procedure well. Findings:      The duodenal bulb and second portion of the duodenum were normal.      Moderate portal hypertensive gastropathy was found in the entire       examined stomach.      The cardia and gastric fundus were normal on retroflexion.      Four columns of non-bleeding large (> 5 mm) varices were found in the       mid esophagus and in the distal esophagus,. No stigmata of recent       bleeding were evident and no red wale signs were present. Extensive        scarring from prior treatment was visible interspersed between varices,       making the ligation technically challenging, therefore, I opted not to       proceed with ligation. Evidence of partial eradication was visible. Impression:           - Normal duodenal bulb and second portion of the                        duodenum.                       - Portal hypertensive gastropathy.                       - Non-bleeding large (> 5 mm) esophageal varices.                       - No specimens collected. Recommendation:       - Discharge patient to home (with escort).                       - Low sodium diet.                       - Continue present medications.                       - Refer to an interventional radiologist for TIPS                        evaluation at appointment to be scheduled. Procedure Code(s):    --- Professional ---                       [phone], Esophagogastroduodenoscopy, flexible, transoral;                        diagnostic, including collection of specimen(s) by                        brushing or washing, when performed (separate procedure) Diagnosis Code(s):    --- Professional ---                       K76.6, Portal hypertension                       K31.89, Other diseases of stomach and duodenum                       I85.00, Esophageal varices without bleeding CPT copyright 2018 American Medical Association. All rights reserved. The codes documented  in this report are preliminary and upon coder review may  be revised to meet current compliance requirements. Dr. Ulyess Mort Lin Landsman MD, MD 06/20/2018 9:02:47 AM This report has been signed electronically. Number of Addenda: 0 Note Initiated On: 06/20/2018 8:46 AM      [facility]

## 2018-06-20 NOTE — Anesthesia Preprocedure Evaluation (Signed)
Anesthesia Evaluation  Patient identified by MRN, date of birth, ID band Patient awake    Reviewed: Allergy & Precautions, H&P , NPO status , Patient's Chart, lab work & pertinent test results  History of Anesthesia Complications Negative for: history of anesthetic complications  Airway Mallampati: III  TM Distance: <3 FB Neck ROM: limited    Dental  (+) Chipped, Poor Dentition   Pulmonary neg shortness of breath, asthma , former smoker,           Cardiovascular Exercise Tolerance: Good hypertension, (-) angina(-) Past MI and (-) DOE      Neuro/Psych  Neuromuscular disease negative psych ROS   GI/Hepatic GERD  Controlled and Medicated,(+) Hepatitis -  Endo/Other  diabetes, Type 2  Renal/GU negative Renal ROS  negative genitourinary   Musculoskeletal  (+) Arthritis ,   Abdominal   Peds  Hematology negative hematology ROS (+)   Anesthesia Other Findings Past Medical History: No date: Allergy No date: Arthritis No date: Asthma No date: Colon polyp No date: Fatty liver No date: GERD (gastroesophageal reflux disease) No date: Hyperlipidemia No date: Hypertension No date: Motion sickness     Comment:  boats  Past Surgical History: 1978: ABDOMINAL HYSTERECTOMY 02/01/2015: COLONOSCOPY WITH PROPOFOL; N/A     Comment:  Procedure: COLONOSCOPY WITH PROPOFOL;  Surgeon: Lucilla Lame, MD;  Location: [place];  Service:               Endoscopy;  Laterality: N/A;  Diabetic - oral meds 07/12/2017: ESOPHAGOGASTRODUODENOSCOPY (EGD) WITH PROPOFOL; N/A     Comment:  Procedure: ESOPHAGOGASTRODUODENOSCOPY (EGD) WITH               PROPOFOL;  Surgeon: Lin Landsman, MD;  Location:               [facility] ENDOSCOPY;  Service: Gastroenterology;  Laterality:               N/A; 08/16/2017: ESOPHAGOGASTRODUODENOSCOPY (EGD) WITH PROPOFOL; N/A     Comment:  Procedure: ESOPHAGOGASTRODUODENOSCOPY (EGD) WITH         PROPOFOL;  Surgeon: Lin Landsman, MD;  Location:               [facility] ENDOSCOPY;  Service: Gastroenterology;  Laterality:               N/A; 02/01/2015: POLYPECTOMY     Comment:  Procedure: POLYPECTOMY;  Surgeon: Lucilla Lame, MD;                Location: [place];  Service: Endoscopy;; No date: TONSILLECTOMY AND ADENOIDECTOMY  BMI    Body Mass Index:  28.91 kg/m      Reproductive/Obstetrics negative OB ROS                             Anesthesia Physical  Anesthesia Plan  ASA: III  Anesthesia Plan: General   Post-op Pain Management:    Induction: Intravenous  PONV Risk Score and Plan: Propofol infusion and TIVA  Airway Management Planned: Natural Airway and Nasal Cannula  Additional Equipment:   Intra-op Plan:   Post-operative Plan:   Informed Consent: I have reviewed the patients History and Physical, chart, labs and discussed the procedure including the risks, benefits and alternatives for the proposed anesthesia with the patient or authorized representative who has indicated his/her understanding and acceptance.  Dental Advisory Given  Plan Discussed with: Anesthesiologist, CRNA and Surgeon  Anesthesia Plan Comments: (Patient consented for risks of anesthesia including but not limited to:  - adverse reactions to medications - risk of intubation if required - damage to teeth, lips or other oral mucosa - sore throat or hoarseness - Damage to heart, brain, lungs or loss of life  Patient voiced understanding.)        Anesthesia Quick Evaluation

## 2018-06-21 ENCOUNTER — Encounter: Payer: Self-pay | Admitting: Gastroenterology

## 2018-06-22 DIAGNOSIS — S32511D Fracture of superior rim of right pubis, subsequent encounter for fracture with routine healing: Secondary | ICD-10-CM | POA: Diagnosis not present

## 2018-06-25 ENCOUNTER — Other Ambulatory Visit: Payer: Self-pay | Admitting: Gastroenterology

## 2018-06-26 ENCOUNTER — Other Ambulatory Visit: Payer: Self-pay | Admitting: Gastroenterology

## 2018-06-27 ENCOUNTER — Other Ambulatory Visit: Payer: Self-pay | Admitting: Gastroenterology

## 2018-06-28 ENCOUNTER — Other Ambulatory Visit: Payer: Self-pay

## 2018-06-28 MED ORDER — SPIRONOLACTONE 100 MG PO TABS: 100.0000 mg | ORAL_TABLET | Freq: Every day | ORAL | 3 refills | Status: DC

## 2018-07-01 ENCOUNTER — Telehealth: Payer: Self-pay | Admitting: Gastroenterology

## 2018-07-01 ENCOUNTER — Other Ambulatory Visit: Payer: Self-pay

## 2018-07-01 ENCOUNTER — Other Ambulatory Visit: Payer: Self-pay | Admitting: Gastroenterology

## 2018-07-01 DIAGNOSIS — K7290 Hepatic failure, unspecified without coma: Secondary | ICD-10-CM

## 2018-07-01 DIAGNOSIS — K7460 Unspecified cirrhosis of liver: Secondary | ICD-10-CM

## 2018-07-01 NOTE — Telephone Encounter (Signed)
Dr Marius Ditch spoke with patient and states patient needs a shunt put in @ [facility]. The patient would like to know several days in advance when this is schedule because she has 3 dogs she has to find a sitter for.

## 2018-07-09 ENCOUNTER — Other Ambulatory Visit: Payer: Self-pay | Admitting: Interventional Radiology

## 2018-07-09 ENCOUNTER — Other Ambulatory Visit: Payer: Self-pay | Admitting: Radiology

## 2018-07-09 ENCOUNTER — Encounter: Payer: Self-pay | Admitting: Radiology

## 2018-07-09 ENCOUNTER — Ambulatory Visit
Admission: RE | Admit: 2018-07-09 | Discharge: 2018-07-09 | Disposition: A | Discharge status: Home / Self Care | Payer: Medicare HMO | Source: Ambulatory Visit | Attending: Gastroenterology | Admitting: Gastroenterology

## 2018-07-09 DIAGNOSIS — K7581 Nonalcoholic steatohepatitis (NASH): Secondary | ICD-10-CM

## 2018-07-09 DIAGNOSIS — K7460 Unspecified cirrhosis of liver: Secondary | ICD-10-CM | POA: Diagnosis not present

## 2018-07-09 DIAGNOSIS — K7290 Hepatic failure, unspecified without coma: Secondary | ICD-10-CM

## 2018-07-09 HISTORY — PX: IR RADIOLOGIST EVAL & MGMT: IMG5224

## 2018-07-09 NOTE — Consult Note (Signed)
Chief Complaint: TIPS evaluation  Referring Physician(s): Lin Landsman  History of Present Illness: Jennifer [name] is a 78 y.o. female with past medical history significant for hypertension, hyperlipidemia, asthma and NASH cirrhosis who presents today for TIPS evaluation.  The patient is accompanied by her sister though serves as her own historian.  Patient was admitted to the hospital in March of last year secondary to a hemodynamically significant upper variceal GI bleed requiring intubation and which was ultimately treated endoscopy.  Patient has undergone several subsequent endoscopies which have continued to demonstrate enlarged esophageal varices which based on most recent endoscopy performed by Dr. Marius Ditch on 2/13, are felt to no longer be amenable to endoscopic treatment.  As such, patient has been referred for TIPS evaluation.  Patient denies any recurrent bleeding since last March.  Specifically, no hematemesis, bloody or melanotic stools.    Patient admits to fatigue though is otherwise without complaint.    Specifically, no change in mental status.  No yellowing of the skin or eyes.  No chest pain or shortness of breath.    Past Medical History:  Diagnosis Date  . Allergy   . Arthritis   . Asthma   . Colon polyp   . Fatty liver   . GERD (gastroesophageal reflux disease)   . Hyperlipidemia   . Hypertension   . Motion sickness    boats    Past Surgical History:  Procedure Laterality Date  . ABDOMINAL HYSTERECTOMY  1978  . COLONOSCOPY WITH PROPOFOL N/A 02/01/2015   Procedure: COLONOSCOPY WITH PROPOFOL;  Surgeon: Lucilla Lame, MD;  Location: [place];  Service: Endoscopy;  Laterality: N/A;  Diabetic - oral meds  . COLONOSCOPY WITH PROPOFOL N/A 11/22/2017   Procedure: COLONOSCOPY WITH PROPOFOL;  Surgeon: Lin Landsman, MD;  Location: [facility] ENDOSCOPY;  Service: Gastroenterology;  Laterality: N/A;  . ESOPHAGOGASTRODUODENOSCOPY (EGD) WITH  PROPOFOL N/A 07/12/2017   Procedure: ESOPHAGOGASTRODUODENOSCOPY (EGD) WITH PROPOFOL;  Surgeon: Lin Landsman, MD;  Location: [facility] ENDOSCOPY;  Service: Gastroenterology;  Laterality: N/A;  . ESOPHAGOGASTRODUODENOSCOPY (EGD) WITH PROPOFOL N/A 08/16/2017   Procedure: ESOPHAGOGASTRODUODENOSCOPY (EGD) WITH PROPOFOL;  Surgeon: Lin Landsman, MD;  Location: [facility] ENDOSCOPY;  Service: Gastroenterology;  Laterality: N/A;  . ESOPHAGOGASTRODUODENOSCOPY (EGD) WITH PROPOFOL N/A 09/20/2017   Procedure: ESOPHAGOGASTRODUODENOSCOPY (EGD) WITH PROPOFOL;  Surgeon: Lin Landsman, MD;  Location: [facility] ENDOSCOPY;  Service: Gastroenterology;  Laterality: N/A;  . ESOPHAGOGASTRODUODENOSCOPY (EGD) WITH PROPOFOL N/A 11/01/2017   Procedure: ESOPHAGOGASTRODUODENOSCOPY (EGD) WITH PROPOFOL;  Surgeon: Lin Landsman, MD;  Location: [facility] ENDOSCOPY;  Service: Gastroenterology;  Laterality: N/A;  . ESOPHAGOGASTRODUODENOSCOPY (EGD) WITH PROPOFOL N/A 01/17/2018   Procedure: ESOPHAGOGASTRODUODENOSCOPY (EGD) WITH PROPOFOL;  Surgeon: Lin Landsman, MD;  Location: [facility] ENDOSCOPY;  Service: Gastroenterology;  Laterality: N/A;  . ESOPHAGOGASTRODUODENOSCOPY (EGD) WITH PROPOFOL N/A 06/20/2018   Procedure: ESOPHAGOGASTRODUODENOSCOPY (EGD) WITH PROPOFOL;  Surgeon: Lin Landsman, MD;  Location: [facility] ENDOSCOPY;  Service: Gastroenterology;  Laterality: N/A;  EGD/ with banding ligation   . POLYPECTOMY  02/01/2015   Procedure: POLYPECTOMY;  Surgeon: Lucilla Lame, MD;  Location: [place];  Service: Endoscopy;;  . TONSILLECTOMY AND ADENOIDECTOMY      Allergies: Codeine; Hydrocodone-acetaminophen; Nitrofurantoin; Nitrofurantoin monohyd macro; Penicillins; and Sulfa antibiotics  Medications: Prior to Admission medications   Medication Sig Start Date End Date Taking? Authorizing Provider  furosemide (LASIX) 20 MG tablet Give 1/2 tablet (53m) by mouth daily 02/13/18  Yes [provider]  glucose blood  (ACCU-CHEK AVIVA PLUS) test strip  Test fasting blood sugar only once each morning. 04/25/18  Yes Chrismon, Vickki Muff, PA  nadolol (CORGARD) 20 MG tablet Take 1 tablet (20 mg total) by mouth daily. 06/20/18 09/18/18 Yes Vanga, Tally Due, MD  pantoprazole (PROTONIX) 40 MG tablet Take 1 tablet (40 mg total) by mouth 2 (two) times daily. 05/22/18 07/21/18 Yes Vanga, Tally Due, MD  spironolactone (ALDACTONE) 100 MG tablet Take 1 tablet (100 mg total) by mouth daily. 06/28/18  Yes Mar Daring, PA-C  albuterol (PROVENTIL HFA;VENTOLIN HFA) 108 (90 Base) MCG/ACT inhaler Inhale 2 puffs into the lungs every 6 (six) hours as needed for wheezing or shortness of breath. Patient not taking: Reported on 07/09/2018 03/30/17   Chrismon, Vickki Muff, PA  amoxicillin-clavulanate (AUGMENTIN) 500-125 MG tablet Take 1 tablet (500 mg total) by mouth 3 (three) times daily. Patient not taking: Reported on 07/09/2018 06/19/18   Mar Daring, PA-C  docusate sodium (COLACE) 100 MG capsule Take 1 capsule (100 mg total) by mouth 2 (two) times daily as needed for mild constipation or moderate constipation. Patient not taking: Reported on 07/09/2018 02/01/18   Nicholes Mango, MD  ergocalciferol (VITAMIN D2) 50000 units capsule Take 50,000 Units by mouth once a week. Every Friday 02/01/18   [provider]  IRON PO Take 45 mg by mouth once a week. Every Monday    [provider]  magnesium oxide (MAG-OX) 400 MG tablet Take 400 mg by mouth 2 (two) times daily.    [provider]  Nutritional Supplements (NUTRITIONAL SUPPLEMENT PO) Diet Type: NAS (no added salt) diet, NCS    [provider]  traMADol (ULTRAM) 50 MG tablet TAKE ONE TABLET BY MOUTH EVERY 6 HOURS AS NEEDED FOR MODERATE PAIN Patient not taking: Reported on 06/19/2018 03/14/18   Chrismon, Vickki Muff, PA     Family History  Problem Relation Age of Onset  . Dementia Mother   . Alcohol abuse Father   . Cancer Sister        lung  .  Lymphoma Sister   . Lymphoma Sister     Social History   Socioeconomic History  . Marital status: Widowed    Spouse name: Not on file  . Number of children: 2  . Years of education: Not on file  . Highest education level: Not on file  Occupational History  . Occupation: retired  Scientific laboratory technician  . Financial resource strain: Not hard at all  . Food insecurity:    Worry: Never true    Inability: Never true  . Transportation needs:    Medical: No    Non-medical: No  Tobacco Use  . Smoking status: Former Smoker    Packs/day: 0.50    Years: 6.00    Pack years: 3.00    Types: Cigarettes    Last attempt to quit: 05/08/1987    Years since quitting: 31.1  . Smokeless tobacco: Never Used  Substance and Sexual Activity  . Alcohol use: Never    Frequency: Never  . Drug use: Never  . Sexual activity: Not on file  Lifestyle  . Physical activity:    Days per week: Not on file    Minutes per session: Not on file  . Stress: Not at all  Relationships  . Social connections:    Talks on phone: Not on file    Gets together: Not on file    Attends religious service: Not on file    Active member of club or organization: Not  on file    Attends meetings of clubs or organizations: Not on file    Relationship status: Not on file  Other Topics Concern  . Not on file  Social History Narrative  . Not on file    ECOG Status: 1 - Symptomatic but completely ambulatory  Review of Systems: A 12 point ROS discussed and pertinent positives are indicated in the HPI above.  All other systems are negative.  Review of Systems  Constitutional: Positive for activity change and fever.  Respiratory: Negative.   Cardiovascular: Negative.   Gastrointestinal: Negative for abdominal distention, abdominal pain and blood in stool.  Skin: Negative for color change.  Psychiatric/Behavioral: Negative for confusion.    Vital Signs: BP 113/63   Pulse 60   Temp 98.1 F (36.7 C) (Oral)   Resp 16   Ht  5' 1"  (1.549 m)   Wt 74.4 kg   SpO2 98%   BMI 30.99 kg/m   Physical Exam Vitals signs and nursing note reviewed. Exam conducted with a chaperone present.  Constitutional:      Appearance: Normal appearance.  HENT:     Head: Normocephalic and atraumatic.  Eyes:     Conjunctiva/sclera: Conjunctivae normal.  Cardiovascular:     Rate and Rhythm: Normal rate and regular rhythm.     Heart sounds: Normal heart sounds.  Pulmonary:     Effort: Pulmonary effort is normal.     Breath sounds: Normal breath sounds.  Abdominal:     General: Bowel sounds are normal.     Comments: Potential small fluid wave  Skin:    General: Skin is warm and dry.     Coloration: Skin is not jaundiced.  Neurological:     Mental Status: She is alert.  Psychiatric:        Mood and Affect: Mood normal.        Behavior: Behavior normal.        Thought Content: Thought content normal.        Judgment: Judgment normal.    Imaging:  CT abdomen pelvis -  08/09/2017   Labs:  CBC: Recent Labs    01/31/18 0650 02/01/18 0508 02/08/18 0523 03/27/18 1420  WBC 2.5* 3.1* 2.6* 4.2  HGB 11.5* 11.2* 11.1* 12.9  HCT 31.6* 30.6* 30.3* 37.4  PLT 46* 48* 68* 89*    COAGS: Recent Labs    07/12/17 1145 07/13/17 0020 11/16/17 1049  INR 1.32 1.41 1.16  APTT  --   --  31    BMP: Recent Labs    01/16/18 1016 01/30/18 2344 01/31/18 0650 03/27/18 1420  NA 127* 131* 133* 131*  K 4.8 4.5 3.9 4.3  CL 92* 93* 96* 93*  CO2 29 30 30 26   GLUCOSE 166* 118* 112* 95  BUN 18 14 13 16   CALCIUM 9.2 9.5 9.3 9.8  CREATININE 1.05* 0.86 0.83 1.00  GFRNONAA 50* >60 >60 54*  GFRAA 57* >60 >60 62    LIVER FUNCTION TESTS: Recent Labs    08/30/17 1008 11/16/17 1049 01/16/18 1016 03/27/18 1420  BILITOT 0.8 1.3* 1.1 0.9  AST 42* 40 68* 33  ALT 15 21 45* 19  ALKPHOS 70 57 60 97  PROT 6.8 7.3 6.6 6.7  ALBUMIN 3.7 4.0 3.7 3.9    TUMOR MARKERS: No results for input(s): AFPTM, CEA, CA199, CHROMGRNA in the last  8760 hours.  Assessment and Plan:  Jennifer [name] is a 78 y.o. female with past medical history significant for  hypertension, hyperlipidemia, asthma and NASH cirrhosis who presents today for TIPS evaluation.    Patient was admitted to the hospital in March of last year secondary to a hemodynamically significant upper variceal GI bleed requiring intubation and which was ultimately treated endoscopy.  Patient has undergone several subsequent endoscopies which have continued to demonstrate enlarged esophageal varices which based on most recent endoscopy performed by Dr. Marius Ditch on 2/13, are felt to no longer be amenable to endoscopic treatment.  As such, patient has been referred for TIPS evaluation.  Calculated Na MELD score based on most recent laboratory values - 15 Creatinine - 1.0; bilirubin - 0.9; INR  -1.1; sodium - 131 (INR from 11/16/2017; remaining laboratory values from 03/27/2018).  CT scan of the abdomen pelvis was performed in April of last year demonstrated patency of the portal venous system and was negative for definitive evidence of a hepatoma, however did demonstrate a small to moderate amount of intra-abdominal ascites at that time.  On physical examination, the patient may have a trace amount of intra-abdominal ascites though is currently asymptomatic.  Based on the above, as well lack of any definitive encephalopathic symptoms, I believe the patient is a good candidate for potential TIPS creation (though more up to date labs and imaging, including a cardiac echo, will need to be obtained).  As such, prolonged conversations were held with the patient and the patient sister regarding the risks and benefits of TIPS and potential variceal embolization were discussed with the patient and the patient's sister including, but not limited to, infection, bleeding, damage to adjacent structures, worsening hepatic and/or cardiac function, non-target embolization and death.   All of the  patient's questions were answered, patient is agreeable to proceed.  PLAN:  -Will obtain CMP and INR (to calculate a more up-to-date NaMELD score) as well as a CBC and ammonia level. - Will obtain a BRTO protocol CT scan for procedural planning purposes as well as to further evaluate for development of a hepatoma. - Will obtain a cardiac echo to ensure adequate right heart function. (note, the patient has no pre-existing cardiac history).  Pending the above, the TIPS procedure will be scheduled at [place] will require general anesthesia as well as an overnight admission for continued observation.  The patient and the patient's sister are in agreement with the above plan of care and knows to call the interventional radiology clinic with any interval questions or concerns   Thank you for this interesting consult.  I greatly enjoyed meeting Jennifer [name] and look forward to participating in their care.  A copy of this report was sent to the requesting provider on this date.  Electronically Signed: Sandi Mariscal 07/09/2018, 10:39 AM   I spent a total of 40 Minutes in face to face in clinical consultation, greater than 50% of which was counseling/coordinating care for TIPS evaluation

## 2018-07-10 ENCOUNTER — Telehealth: Payer: Self-pay | Admitting: Interventional Radiology

## 2018-07-10 LAB — CBC
HCT: 35.7 % (ref 35.0–45.0)
Hemoglobin: 12.2 g/dL (ref 11.7–15.5)
MCH: 31 pg (ref 27.0–33.0)
MCHC: 34.2 g/dL (ref 32.0–36.0)
MCV: 90.6 fL (ref 80.0–100.0)
MPV: 12 fL (ref 7.5–12.5)
Platelets: 59 10*3/uL — ABNORMAL LOW (ref 140–400)
RBC: 3.94 10*6/uL (ref 3.80–5.10)
RDW: 13.5 % (ref 11.0–15.0)
WBC: 2.9 10*3/uL — ABNORMAL LOW (ref 3.8–10.8)

## 2018-07-10 LAB — COMPLETE METABOLIC PANEL WITH GFR
AG Ratio: 1.3 (calc) (ref 1.0–2.5)
ALBUMIN MSPROF: 3.7 g/dL (ref 3.6–5.1)
ALT: 56 U/L — ABNORMAL HIGH (ref 6–29)
AST: 101 U/L — ABNORMAL HIGH (ref 10–35)
Alkaline phosphatase (APISO): 71 U/L (ref 37–153)
BILIRUBIN TOTAL: 1.1 mg/dL (ref 0.2–1.2)
BUN / CREAT RATIO: 13 (calc) (ref 6–22)
BUN: 13 mg/dL (ref 7–25)
CO2: 30 mmol/L (ref 20–32)
Calcium: 9.2 mg/dL (ref 8.6–10.4)
Chloride: 98 mmol/L (ref 98–110)
Creat: 0.98 mg/dL — ABNORMAL HIGH (ref 0.60–0.93)
GFR, Est African American: 64 mL/min/{1.73_m2} (ref >=60)
GFR, Est Non African American: 55 mL/min/{1.73_m2} — ABNORMAL LOW (ref >=60)
Globulin: 2.9 g/dL (calc) (ref 1.9–3.7)
Glucose, Bld: 123 mg/dL — ABNORMAL HIGH (ref 65–99)
Potassium: 4.2 mmol/L (ref 3.5–5.3)
SODIUM: 135 mmol/L (ref 135–146)
Total Protein: 6.6 g/dL (ref 6.1–8.1)

## 2018-07-10 LAB — PROTIME-INR
INR: 1.2 — ABNORMAL HIGH
Prothrombin Time: 11.9 s — ABNORMAL HIGH (ref 9.0–11.5)

## 2018-07-10 LAB — AMMONIA: Ammonia: 46 umol/L (ref <=72)

## 2018-07-10 NOTE — Telephone Encounter (Signed)
Reviewed pt's labs.  Pt has an excellent calculated Na MELD score of 13.  Pt is noted to be thrombocytopenic (PLTs = 59), and as such, may require a platelet transfusion at the time of attempted TIPS.  Awaiting results of BRTO protocol CT and cardiac echo prior to scheduling elective TIPS at [facility] with Anesthesia.  Ronny Bacon, MD Pager #: [phone]

## 2018-07-17 ENCOUNTER — Inpatient Hospital Stay (HOSPITAL_BASED_OUTPATIENT_CLINIC_OR_DEPARTMENT_OTHER): Payer: Medicare HMO | Admitting: Internal Medicine

## 2018-07-17 ENCOUNTER — Other Ambulatory Visit: Payer: Self-pay

## 2018-07-17 ENCOUNTER — Inpatient Hospital Stay: Payer: Medicare HMO | Attending: Internal Medicine

## 2018-07-17 ENCOUNTER — Encounter (INDEPENDENT_AMBULATORY_CARE_PROVIDER_SITE_OTHER): Payer: Self-pay

## 2018-07-17 ENCOUNTER — Encounter: Payer: Self-pay | Admitting: Internal Medicine

## 2018-07-17 VITALS — BP 127/81 | HR 58 | Temp 97.6°F | Resp 20 | Ht 61.0 in | Wt 165.0 lb

## 2018-07-17 DIAGNOSIS — D696 Thrombocytopenia, unspecified: Secondary | ICD-10-CM

## 2018-07-17 DIAGNOSIS — K7460 Unspecified cirrhosis of liver: Secondary | ICD-10-CM | POA: Insufficient documentation

## 2018-07-17 DIAGNOSIS — D6959 Other secondary thrombocytopenia: Secondary | ICD-10-CM | POA: Diagnosis not present

## 2018-07-17 DIAGNOSIS — Z87891 Personal history of nicotine dependence: Secondary | ICD-10-CM | POA: Diagnosis not present

## 2018-07-17 DIAGNOSIS — E871 Hypo-osmolality and hyponatremia: Secondary | ICD-10-CM | POA: Insufficient documentation

## 2018-07-17 DIAGNOSIS — K7469 Other cirrhosis of liver: Secondary | ICD-10-CM

## 2018-07-17 LAB — COMPREHENSIVE METABOLIC PANEL
ALT: 80 U/L — AB (ref 0–44)
AST: 134 U/L — AB (ref 15–41)
Albumin: 3.8 g/dL (ref 3.5–5.0)
Alkaline Phosphatase: 73 U/L (ref 38–126)
Anion gap: 7 (ref 5–15)
BUN: 16 mg/dL (ref 8–23)
CO2: 28 mmol/L (ref 22–32)
CREATININE: 0.87 mg/dL (ref 0.44–1.00)
Calcium: 9 mg/dL (ref 8.9–10.3)
Chloride: 98 mmol/L (ref 98–111)
Glucose, Bld: 137 mg/dL — ABNORMAL HIGH (ref 70–99)
Potassium: 4.3 mmol/L (ref 3.5–5.1)
Sodium: 133 mmol/L — ABNORMAL LOW (ref 135–145)
Total Bilirubin: 1.6 mg/dL — ABNORMAL HIGH (ref 0.3–1.2)
Total Protein: 7 g/dL (ref 6.5–8.1)

## 2018-07-17 LAB — CBC WITH DIFFERENTIAL/PLATELET
Abs Immature Granulocytes: 0 10*3/uL (ref 0.00–0.07)
Basophils Absolute: 0 10*3/uL (ref 0.0–0.1)
Basophils Relative: 0 %
Eosinophils Absolute: 0 10*3/uL (ref 0.0–0.5)
Eosinophils Relative: 2 %
HEMATOCRIT: 34.7 % — AB (ref 36.0–46.0)
HEMOGLOBIN: 11.9 g/dL — AB (ref 12.0–15.0)
Immature Granulocytes: 0 %
Lymphocytes Relative: 24 %
Lymphs Abs: 0.6 10*3/uL — ABNORMAL LOW (ref 0.7–4.0)
MCH: 31.2 pg (ref 26.0–34.0)
MCHC: 34.3 g/dL (ref 30.0–36.0)
MCV: 91.1 fL (ref 80.0–100.0)
Monocytes Absolute: 0.2 10*3/uL (ref 0.1–1.0)
Monocytes Relative: 8 %
Neutro Abs: 1.6 10*3/uL — ABNORMAL LOW (ref 1.7–7.7)
Neutrophils Relative %: 66 %
Platelets: 54 10*3/uL — ABNORMAL LOW (ref 150–400)
RBC: 3.81 MIL/uL — AB (ref 3.87–5.11)
RDW: 13.7 % (ref 11.5–15.5)
WBC: 2.4 10*3/uL — ABNORMAL LOW (ref 4.0–10.5)
nRBC: 0 % (ref 0.0–0.2)

## 2018-07-17 LAB — PROTIME-INR
INR: 1.1 (ref 0.8–1.2)
Prothrombin Time: 14.5 seconds (ref 11.4–15.2)

## 2018-07-17 LAB — APTT: aPTT: 30 seconds (ref 24–36)

## 2018-07-17 NOTE — Progress Notes (Signed)
[place] NOTE  Patient Care Team: Rubye Beach as PCP - General (Family Medicine) Bary Castilla, Forest Gleason, MD (General Surgery) Anell Barr, OD as Consulting Physician (Optometry) Cammie Sickle, MD as Consulting Physician (Internal Medicine) Lin Landsman, MD as Consulting Physician (Gastroenterology)  CHIEF COMPLAINTS/PURPOSE OF CONSULTATION:   # THROMBOCYTOPENIA- [2015-100s-80s; 2017] N-Hb/WBC;  Hypersplenism/splenomegaly; no clumping.   #  Cirrhosis/ splenomegaly [US- 2016; stable since 2013]; Hep B/Hep C negative on 12/29/15; DECOMPENSATION of CIRRHOSIS [feb 2019; Dr.Vanga]  HISTORY OF PRESENTING ILLNESS:  Jennifer [name] 78 y.o.  female Caucasian patient history of thrombocytopenia/compensated cirrhosis/splenomegaly [based on imaging] is here for follow-up.  In the interim patient is being evaluated by IR for TIPS procedure given her history of refractory varices.  She is being worked up with CT scan/2D echo as part of the work-up prior to TIPS  Review of Systems  Constitutional: Positive for malaise/fatigue. Negative for chills, diaphoresis, fever and weight loss.  HENT: Negative for nosebleeds and sore throat.   Eyes: Negative for double vision.  Respiratory: Negative for cough, hemoptysis, sputum production, shortness of breath and wheezing.   Cardiovascular: Negative for chest pain, palpitations, orthopnea and leg swelling.  Gastrointestinal: Negative for abdominal pain, blood in stool, constipation, diarrhea, heartburn, melena, nausea and vomiting.  Genitourinary: Negative for dysuria, frequency and urgency.  Musculoskeletal: Positive for myalgias and neck pain. Negative for back pain and joint pain.  Skin: Negative.  Negative for itching and rash.  Neurological: Negative for dizziness, tingling, focal weakness, weakness and headaches.  Endo/Heme/Allergies: Bruises/bleeds easily.  Psychiatric/Behavioral: Negative for  depression. The patient is not nervous/anxious and does not have insomnia.      MEDICAL HISTORY:  Past Medical History:  Diagnosis Date  . Allergy   . Arthritis   . Asthma   . Colon polyp   . Fatty liver   . GERD (gastroesophageal reflux disease)   . Hyperlipidemia   . Hypertension   . Motion sickness    boats    SURGICAL HISTORY: Past Surgical History:  Procedure Laterality Date  . ABDOMINAL HYSTERECTOMY  1978  . COLONOSCOPY WITH PROPOFOL N/A 02/01/2015   Procedure: COLONOSCOPY WITH PROPOFOL;  Surgeon: Lucilla Lame, MD;  Location: [place];  Service: Endoscopy;  Laterality: N/A;  Diabetic - oral meds  . COLONOSCOPY WITH PROPOFOL N/A 11/22/2017   Procedure: COLONOSCOPY WITH PROPOFOL;  Surgeon: Lin Landsman, MD;  Location: [facility] ENDOSCOPY;  Service: Gastroenterology;  Laterality: N/A;  . ESOPHAGOGASTRODUODENOSCOPY (EGD) WITH PROPOFOL N/A 07/12/2017   Procedure: ESOPHAGOGASTRODUODENOSCOPY (EGD) WITH PROPOFOL;  Surgeon: Lin Landsman, MD;  Location: [facility] ENDOSCOPY;  Service: Gastroenterology;  Laterality: N/A;  . ESOPHAGOGASTRODUODENOSCOPY (EGD) WITH PROPOFOL N/A 08/16/2017   Procedure: ESOPHAGOGASTRODUODENOSCOPY (EGD) WITH PROPOFOL;  Surgeon: Lin Landsman, MD;  Location: [facility] ENDOSCOPY;  Service: Gastroenterology;  Laterality: N/A;  . ESOPHAGOGASTRODUODENOSCOPY (EGD) WITH PROPOFOL N/A 09/20/2017   Procedure: ESOPHAGOGASTRODUODENOSCOPY (EGD) WITH PROPOFOL;  Surgeon: Lin Landsman, MD;  Location: [facility] ENDOSCOPY;  Service: Gastroenterology;  Laterality: N/A;  . ESOPHAGOGASTRODUODENOSCOPY (EGD) WITH PROPOFOL N/A 11/01/2017   Procedure: ESOPHAGOGASTRODUODENOSCOPY (EGD) WITH PROPOFOL;  Surgeon: Lin Landsman, MD;  Location: [facility] ENDOSCOPY;  Service: Gastroenterology;  Laterality: N/A;  . ESOPHAGOGASTRODUODENOSCOPY (EGD) WITH PROPOFOL N/A 01/17/2018   Procedure: ESOPHAGOGASTRODUODENOSCOPY (EGD) WITH PROPOFOL;  Surgeon: Lin Landsman, MD;  Location:  [facility] ENDOSCOPY;  Service: Gastroenterology;  Laterality: N/A;  . ESOPHAGOGASTRODUODENOSCOPY (EGD) WITH PROPOFOL N/A 06/20/2018   Procedure: ESOPHAGOGASTRODUODENOSCOPY (EGD) WITH  PROPOFOL;  Surgeon: Lin Landsman, MD;  Location: [facility] ENDOSCOPY;  Service: Gastroenterology;  Laterality: N/A;  EGD/ with banding ligation   . IR RADIOLOGIST EVAL & MGMT  07/09/2018  . POLYPECTOMY  02/01/2015   Procedure: POLYPECTOMY;  Surgeon: Lucilla Lame, MD;  Location: [place];  Service: Endoscopy;;  . TONSILLECTOMY AND ADENOIDECTOMY      SOCIAL HISTORY: Social History   Socioeconomic History  . Marital status: Widowed    Spouse name: Not on file  . Number of children: 2  . Years of education: Not on file  . Highest education level: Not on file  Occupational History  . Occupation: retired  Scientific laboratory technician  . Financial resource strain: Not hard at all  . Food insecurity:    Worry: Never true    Inability: Never true  . Transportation needs:    Medical: No    Non-medical: No  Tobacco Use  . Smoking status: Former Smoker    Packs/day: 0.50    Years: 6.00    Pack years: 3.00    Types: Cigarettes    Last attempt to quit: 05/08/1987    Years since quitting: 31.2  . Smokeless tobacco: Never Used  Substance and Sexual Activity  . Alcohol use: Never    Frequency: Never  . Drug use: Never  . Sexual activity: Not on file  Lifestyle  . Physical activity:    Days per week: Not on file    Minutes per session: Not on file  . Stress: Not at all  Relationships  . Social connections:    Talks on phone: Not on file    Gets together: Not on file    Attends religious service: Not on file    Active member of club or organization: Not on file    Attends meetings of clubs or organizations: Not on file    Relationship status: Not on file  . Intimate partner violence:    Fear of current or ex partner: Not on file    Emotionally abused: Not on file    Physically abused: Not on file    Forced  sexual activity: Not on file  Other Topics Concern  . Not on file  Social History Narrative  . Not on file    FAMILY HISTORY: Family History  Problem Relation Age of Onset  . Dementia Mother   . Alcohol abuse Father   . Cancer Sister        lung  . Lymphoma Sister   . Lymphoma Sister     ALLERGIES:  is allergic to amoxicillin; codeine; hydrocodone-acetaminophen; nitrofurantoin; nitrofurantoin monohyd macro; penicillins; and sulfa antibiotics.  MEDICATIONS:  Current Outpatient Medications  Medication Sig Dispense Refill  . furosemide (LASIX) 20 MG tablet Give 1/2 tablet (73m) by mouth daily    . glucose blood (ACCU-CHEK AVIVA PLUS) test strip Test fasting blood sugar only once each morning. 100 each 3  . nadolol (CORGARD) 20 MG tablet Take 1 tablet (20 mg total) by mouth daily. 90 tablet 0  . pantoprazole (PROTONIX) 40 MG tablet Take 1 tablet (40 mg total) by mouth 2 (two) times daily. 120 tablet 0  . spironolactone (ALDACTONE) 100 MG tablet Take 1 tablet (100 mg total) by mouth daily. 90 tablet 3  . ergocalciferol (VITAMIN D2) 50000 units capsule Take 50,000 Units by mouth once a week. Every Friday     No current facility-administered medications for this visit.       .Marland Kitchen PHYSICAL EXAMINATION:  Vitals:   07/17/18 1025  BP: 127/81  Pulse: (!) 58  Resp: 20  Temp: 97.6 F (36.4 C)   Filed Weights   07/17/18 1025  Weight: 165 lb (74.8 kg)    Physical Exam  Constitutional: She is oriented to person, place, and time and well-developed, well-nourished, and in no distress.  HENT:  Head: Normocephalic and atraumatic.  Mouth/Throat: Oropharynx is clear and moist. No oropharyngeal exudate.  Eyes: Pupils are equal, round, and reactive to light.  Neck: Normal range of motion. Neck supple.  Cardiovascular: Normal rate and regular rhythm.  Pulmonary/Chest: No respiratory distress. She has no wheezes.  Abdominal: Soft. Bowel sounds are normal. She exhibits no distension  and no mass. There is no abdominal tenderness. There is no rebound and no guarding.  Musculoskeletal: Normal range of motion.        General: No tenderness or edema.  Neurological: She is alert and oriented to person, place, and time.  Skin: Skin is warm.  Psychiatric: Affect normal.     LABORATORY DATA:  I have reviewed the data as listed Lab Results  Component Value Date   WBC 2.4 (L) 07/17/2018   HGB 11.9 (L) 07/17/2018   HCT 34.7 (L) 07/17/2018   MCV 91.1 07/17/2018   PLT 54 (L) 07/17/2018   Recent Labs    01/16/18 1016  03/27/18 1420 07/09/18 1057 07/17/18 1001  NA 127*   < > 131* 135 133*  K 4.8   < > 4.3 4.2 4.3  CL 92*   < > 93* 98 98  CO2 29   < > 26 30 28   GLUCOSE 166*   < > 95 123* 137*  BUN 18   < > 16 13 16   CREATININE 1.05*   < > 1.00 0.98* 0.87  CALCIUM 9.2   < > 9.8 9.2 9.0  GFRNONAA 50*   < > 54* 55* >60  GFRAA 57*   < > 62 64 >60  PROT 6.6  --  6.7 6.6 7.0  ALBUMIN 3.7  --  3.9  --  3.8  AST 68*  --  33 101* 134*  ALT 45*  --  19 56* 80*  ALKPHOS 60  --  97  --  73  BILITOT 1.1  --  0.9 1.1 1.6*   < > = values in this interval not displayed.    RADIOGRAPHIC STUDIES: I have personally reviewed the radiological images as listed and agreed with the findings in the report. Ir Radiologist Eval & Mgmt  Result Date: 07/09/2018 Please refer to notes tab for details about interventional procedure. (Op Note)   ASSESSMENT & PLAN:  Thrombocytopenia ([place]) # Thrombocytopenia secondary to hypersplenism from splenomegaly.  Platelets today 57 but no bleeding. STABLE.    # Cirrhosis/recent decompensation s/p paracentesis; awaiting TIPs;work up- CT/ echo in progress.   # Hyponatremia-sodium 133- STABLE/. Continue aldactone/nadalol.   # Constipation-continue miralax /Dulcolax; stable.     # DISPOSITION:  #  follow up 6 months- MD/labs-CBC CMP PT PTT AFP-Dr.B  Cc; Vanga/ Crismon.     Cammie Sickle, MD 07/21/2018 10:48 AM

## 2018-07-17 NOTE — Assessment & Plan Note (Addendum)
#   Thrombocytopenia secondary to hypersplenism from splenomegaly.  Platelets today 57 but no bleeding. STABLE.    # Cirrhosis/recent decompensation s/p paracentesis; awaiting TIPs;work up- CT/ echo in progress.   # Hyponatremia-sodium 133- STABLE/. Continue aldactone/nadalol.   # Constipation-continue miralax /Dulcolax; stable.     # DISPOSITION:  #  follow up 6 months- MD/labs-CBC CMP PT PTT AFP-Dr.B  Cc; Vanga/ Crismon.

## 2018-07-18 LAB — AFP TUMOR MARKER: AFP, Serum, Tumor Marker: 3.8 ng/mL (ref 0.0–8.3)

## 2018-07-21 DIAGNOSIS — S32511D Fracture of superior rim of right pubis, subsequent encounter for fracture with routine healing: Secondary | ICD-10-CM | POA: Diagnosis not present

## 2018-07-23 ENCOUNTER — Other Ambulatory Visit: Payer: Self-pay | Admitting: Gastroenterology

## 2018-07-24 ENCOUNTER — Telehealth: Payer: Self-pay | Admitting: Interventional Radiology

## 2018-07-24 ENCOUNTER — Other Ambulatory Visit: Payer: Self-pay

## 2018-07-24 ENCOUNTER — Ambulatory Visit
Admission: RE | Admit: 2018-07-24 | Discharge: 2018-07-24 | Disposition: A | Discharge status: Home / Self Care | Payer: Medicare HMO | Source: Ambulatory Visit | Attending: Interventional Radiology | Admitting: Interventional Radiology

## 2018-07-24 DIAGNOSIS — K7290 Hepatic failure, unspecified without coma: Secondary | ICD-10-CM | POA: Diagnosis not present

## 2018-07-24 DIAGNOSIS — K7460 Unspecified cirrhosis of liver: Secondary | ICD-10-CM

## 2018-07-24 DIAGNOSIS — R188 Other ascites: Secondary | ICD-10-CM | POA: Diagnosis not present

## 2018-07-24 DIAGNOSIS — K769 Liver disease, unspecified: Secondary | ICD-10-CM | POA: Diagnosis not present

## 2018-07-24 LAB — POCT I-STAT CREATININE: Creatinine, Ser: 1 mg/dL (ref 0.44–1.00)

## 2018-07-24 MED ORDER — IOPAMIDOL (ISOVUE-370) INJECTION 76%
100.0000 mL | Freq: Once | INTRAVENOUS | Status: AC | PRN
  Administered 2018-07-24: 100 mL via INTRAVENOUS

## 2018-07-24 NOTE — Telephone Encounter (Signed)
Called pt this AM.  She continues to do well and is without complaint.  No bloody or melanotic stools.  No yellowing of the skin or eyes.  No change in mental status.   Explained to pt that her Na MELD score of 15 (calculated from labs from 3/11) makes her a good candidate for elective TIPS intervention.  I also explained that her BRTO CT scan demonstrated anatomy amendable to attempted TIPS and was negative for definitive evidence of Hepatocellular Carcinoma, supported by a normal AFP level of 3.8 (also obtained on 3/11).  She tells me that her cardiac echo is scheduled for 4/9.  I explained to the patient that I would recommend NOT proceeding with the elective TIPS until the current healthcare crisis re COVID-19 has stabilized, as we are currently postponing all elective IR procedures in the [facility] system. The patient demonstrated excellent understanding and is in agreement with the above POC.  She knows to call the IR clinic with any interval questions or concerns.  Ronny Bacon, MD Pager #: [phone]

## 2018-07-25 ENCOUNTER — Telehealth: Payer: Self-pay | Admitting: Physician Assistant

## 2018-07-25 DIAGNOSIS — K219 Gastro-esophageal reflux disease without esophagitis: Secondary | ICD-10-CM

## 2018-07-25 NOTE — Telephone Encounter (Signed)
I called the patient to schedule AWV/CPE in May. She mentioned that she needs a refill for Pantoprazole Sodium 103m sent to [facility]. VDM (DD)

## 2018-07-26 MED ORDER — PANTOPRAZOLE SODIUM 40 MG PO TBEC: 40.0000 mg | DELAYED_RELEASE_TABLET | Freq: Two times a day (BID) | ORAL | 0 refills | Status: DC

## 2018-07-26 NOTE — Telephone Encounter (Signed)
Refilled to [facility]

## 2018-07-29 ENCOUNTER — Ambulatory Visit: Payer: Medicare HMO

## 2018-08-05 ENCOUNTER — Telehealth: Payer: Self-pay

## 2018-08-05 NOTE — Telephone Encounter (Signed)
Patient advised as directed below.  Thanks,  -Brightyn Mozer 

## 2018-08-05 NOTE — Telephone Encounter (Signed)
Patient called wanting Tawanna Sat to look at her CT of ba scan on 3/18 and let her know if her test was better or worse than the one she had done on 08/09/2017. She reports that she has this test done once a year, but she was not sure if it is stable. Please review. Thanks!

## 2018-08-05 NOTE — Telephone Encounter (Signed)
Please review. KW 

## 2018-08-05 NOTE — Telephone Encounter (Signed)
I would recommend her to call whomever is following this. From what I am reading they do not mention much change, but whomever if following this can evaluate the images themselves more instead of reading the report only.

## 2018-08-06 ENCOUNTER — Other Ambulatory Visit: Payer: Self-pay | Admitting: Gastroenterology

## 2018-08-07 ENCOUNTER — Telehealth: Payer: Self-pay | Admitting: *Deleted

## 2018-08-07 ENCOUNTER — Other Ambulatory Visit: Payer: Self-pay | Admitting: Physician Assistant

## 2018-08-07 DIAGNOSIS — M7989 Other specified soft tissue disorders: Secondary | ICD-10-CM

## 2018-08-07 MED ORDER — FUROSEMIDE 20 MG PO TABS: 20.0000 mg | ORAL_TABLET | Freq: Every day | ORAL | 0 refills | Status: DC

## 2018-08-07 NOTE — Telephone Encounter (Signed)
Jennifer [name] from [place] called for clarification on furosemide 20 mg directions? Please advise?

## 2018-08-07 NOTE — Telephone Encounter (Signed)
[facility] faxed refill request for the following medications:  furosemide (LASIX) 40 MG tablet  Sig: Take one tablet every day  Please advise.

## 2018-08-08 MED ORDER — FUROSEMIDE 20 MG PO TABS: 10.0000 mg | ORAL_TABLET | Freq: Every day | ORAL | 1 refills | Status: DC

## 2018-08-08 NOTE — Telephone Encounter (Signed)
Resent corrected order

## 2018-08-08 NOTE — Telephone Encounter (Signed)
Done

## 2018-08-15 ENCOUNTER — Ambulatory Visit: Payer: Medicare HMO

## 2018-08-21 DIAGNOSIS — S32511D Fracture of superior rim of right pubis, subsequent encounter for fracture with routine healing: Secondary | ICD-10-CM | POA: Diagnosis not present

## 2018-09-11 ENCOUNTER — Ambulatory Visit (INDEPENDENT_AMBULATORY_CARE_PROVIDER_SITE_OTHER): Payer: Medicare HMO

## 2018-09-11 ENCOUNTER — Other Ambulatory Visit: Payer: Self-pay

## 2018-09-11 ENCOUNTER — Encounter: Payer: Medicare HMO | Admitting: Physician Assistant

## 2018-09-11 DIAGNOSIS — Z0000 Encounter for general adult medical examination without abnormal findings: Secondary | ICD-10-CM

## 2018-09-11 NOTE — Patient Instructions (Addendum)
Jennifer [name] , Thank you for taking time to come for your Medicare Wellness Visit. I appreciate your ongoing commitment to your health goals. Please review the following plan we discussed and let me know if I can assist you in the future.   Screening recommendations/referrals: Colonoscopy: Up to date, due 11/2020 Mammogram: Up to date, due 03/2019 Bone Density: Up to date, due 01/2020 Recommended yearly ophthalmology/optometry visit for glaucoma screening and checkup Recommended yearly dental visit for hygiene and checkup  Vaccinations: Influenza vaccine: Up to date Pneumococcal vaccine: Completed series Tdap vaccine: Up to date, due 06/2028 Shingles vaccine: Pt declines today.     Advanced directives: Please bring a copy of your POA (Power of Attorney) and/or Living Will to your next appointment.   Conditions/risks identified: Fall risk prevention discussed today.   Next appointment: 11/11/18 @ 2:00 PM with [name].    Preventive Care 25 Years and Older, Female Preventive care refers to lifestyle choices and visits with your health care provider that can promote health and wellness. What does preventive care include?  A yearly physical exam. This is also called an annual well check.  Dental exams once or twice a year.  Routine eye exams. Ask your health care provider how often you should have your eyes checked.  Personal lifestyle choices, including:  Daily care of your teeth and gums.  Regular physical activity.  Eating a healthy diet.  Avoiding tobacco and drug use.  Limiting alcohol use.  Practicing safe sex.  Taking low-dose aspirin every day.  Taking vitamin and mineral supplements as recommended by your health care provider. What happens during an annual well check? The services and screenings done by your health care provider during your annual well check will depend on your age, overall health, lifestyle risk factors, and family history of disease.  Counseling  Your health care provider may ask you questions about your:  Alcohol use.  Tobacco use.  Drug use.  Emotional well-being.  Home and relationship well-being.  Sexual activity.  Eating habits.  History of falls.  Memory and ability to understand (cognition).  Work and work Statistician.  Reproductive health. Screening  You may have the following tests or measurements:  Height, weight, and BMI.  Blood pressure.  Lipid and cholesterol levels. These may be checked every 5 years, or more frequently if you are over 45 years old.  Skin check.  Lung cancer screening. You may have this screening every year starting at age 91 if you have a 30-pack-year history of smoking and currently smoke or have quit within the past 15 years.  Fecal occult blood test (FOBT) of the stool. You may have this test every year starting at age 50.  Flexible sigmoidoscopy or colonoscopy. You may have a sigmoidoscopy every 5 years or a colonoscopy every 10 years starting at age 3.  Hepatitis C blood test.  Hepatitis B blood test.  Sexually transmitted disease (STD) testing.  Diabetes screening. This is done by checking your blood sugar (glucose) after you have not eaten for a while (fasting). You may have this done every 1-3 years.  Bone density scan. This is done to screen for osteoporosis. You may have this done starting at age 30.  Mammogram. This may be done every 1-2 years. Talk to your health care provider about how often you should have regular mammograms. Talk with your health care provider about your test results, treatment options, and if necessary, the need for more tests. Vaccines  Your health  care provider may recommend certain vaccines, such as:  Influenza vaccine. This is recommended every year.  Tetanus, diphtheria, and acellular pertussis (Tdap, Td) vaccine. You may need a Td booster every 10 years.  Zoster vaccine. You may need this after age 51.   Pneumococcal 13-valent conjugate (PCV13) vaccine. One dose is recommended after age 12.  Pneumococcal polysaccharide (PPSV23) vaccine. One dose is recommended after age 92. Talk to your health care provider about which screenings and vaccines you need and how often you need them. This information is not intended to replace advice given to you by your health care provider. Make sure you discuss any questions you have with your health care provider. Document Released: 05/21/2015 Document Revised: 01/12/2016 Document Reviewed: 02/23/2015 Elsevier Interactive Patient Education  2017 [place] Prevention in the Home Falls can cause injuries. They can happen to people of all ages. There are many things you can do to make your home safe and to help prevent falls. What can I do on the outside of my home?  Regularly fix the edges of walkways and driveways and fix any cracks.  Remove anything that might make you trip as you walk through a door, such as a raised step or threshold.  Trim any bushes or trees on the path to your home.  Use bright outdoor lighting.  Clear any walking paths of anything that might make someone trip, such as rocks or tools.  Regularly check to see if handrails are loose or broken. Make sure that both sides of any steps have handrails.  Any raised decks and porches should have guardrails on the edges.  Have any leaves, snow, or ice cleared regularly.  Use sand or salt on walking paths during winter.  Clean up any spills in your garage right away. This includes oil or grease spills. What can I do in the bathroom?  Use night lights.  Install grab bars by the toilet and in the tub and shower. Do not use towel bars as grab bars.  Use non-skid mats or decals in the tub or shower.  If you need to sit down in the shower, use a plastic, non-slip stool.  Keep the floor dry. Clean up any water that spills on the floor as soon as it happens.  Remove soap  buildup in the tub or shower regularly.  Attach bath mats securely with double-sided non-slip rug tape.  Do not have throw rugs and other things on the floor that can make you trip. What can I do in the bedroom?  Use night lights.  Make sure that you have a light by your bed that is easy to reach.  Do not use any sheets or blankets that are too big for your bed. They should not hang down onto the floor.  Have a firm chair that has side arms. You can use this for support while you get dressed.  Do not have throw rugs and other things on the floor that can make you trip. What can I do in the kitchen?  Clean up any spills right away.  Avoid walking on wet floors.  Keep items that you use a lot in easy-to-reach places.  If you need to reach something above you, use a strong step stool that has a grab bar.  Keep electrical cords out of the way.  Do not use floor polish or wax that makes floors slippery. If you must use wax, use non-skid floor wax.  Do not have  throw rugs and other things on the floor that can make you trip. What can I do with my stairs?  Do not leave any items on the stairs.  Make sure that there are handrails on both sides of the stairs and use them. Fix handrails that are broken or loose. Make sure that handrails are as long as the stairways.  Check any carpeting to make sure that it is firmly attached to the stairs. Fix any carpet that is loose or worn.  Avoid having throw rugs at the top or bottom of the stairs. If you do have throw rugs, attach them to the floor with carpet tape.  Make sure that you have a light switch at the top of the stairs and the bottom of the stairs. If you do not have them, ask someone to add them for you. What else can I do to help prevent falls?  Wear shoes that:  Do not have high heels.  Have rubber bottoms.  Are comfortable and fit you well.  Are closed at the toe. Do not wear sandals.  If you use a stepladder:  Make  sure that it is fully opened. Do not climb a closed stepladder.  Make sure that both sides of the stepladder are locked into place.  Ask someone to hold it for you, if possible.  Clearly mark and make sure that you can see:  Any grab bars or handrails.  First and last steps.  Where the edge of each step is.  Use tools that help you move around (mobility aids) if they are needed. These include:  Canes.  Walkers.  Scooters.  Crutches.  Turn on the lights when you go into a dark area. Replace any light bulbs as soon as they burn out.  Set up your furniture so you have a clear path. Avoid moving your furniture around.  If any of your floors are uneven, fix them.  If there are any pets around you, be aware of where they are.  Review your medicines with your doctor. Some medicines can make you feel dizzy. This can increase your chance of falling. Ask your doctor what other things that you can do to help prevent falls. This information is not intended to replace advice given to you by your health care provider. Make sure you discuss any questions you have with your health care provider. Document Released: 02/18/2009 Document Revised: 09/30/2015 Document Reviewed: 05/29/2014 Elsevier Interactive Patient Education  2017 [employer].

## 2018-09-11 NOTE — Progress Notes (Signed)
Subjective:   Jennifer [name] is a 78 y.o. female who presents for Medicare Annual (Subsequent) preventive examination.    This visit is being conducted through telemedicine due to the COVID-19 pandemic. This patient has given me verbal consent via doximity to conduct this visit, patient states they are participating from their home address. Some vital signs may be absent or patient reported.    Patient identification: identified by name, DOB, and current address  Review of Systems:  N/A  Cardiac Risk Factors include: advanced age (>37mn, >>21women);diabetes mellitus;hypertension;obesity (BMI >30kg/m2)     Objective:     Vitals: There were no vitals taken for this visit.  There is no height or weight on file to calculate BMI. Unable to obtain vitals due to visit being conducted via telephonically.   Advanced Directives 07/17/2018 06/20/2018 06/17/2018 02/28/2018 02/18/2018 02/04/2018 01/31/2018  Does Patient Have a Medical Advance Directive? No No No No No No No  Type of Advance Directive - - - - - - -  Does patient want to make changes to medical advance directive? - - - - - - -  Would patient like information on creating a medical advance directive? No - Patient declined No - Patient declined No - Patient declined No - Patient declined No - Patient declined No - Patient declined No - Patient declined    Tobacco Social History   Tobacco Use  Smoking Status Former Smoker  . Packs/day: 0.50  . Years: 6.00  . Pack years: 3.00  . Types: Cigarettes  . Last attempt to quit: 05/08/1987  . Years since quitting: 31.3  Smokeless Tobacco Never Used     Counseling given: Not Answered   Clinical Intake:  Pre-visit preparation completed: Yes  Pain : No/denies pain Pain Score: 0-No pain     Nutritional Status: BMI > 30  Obese Nutritional Risks: None Diabetes: Yes  How often do you need to have someone help you when you read instructions, pamphlets, or other written materials  from your doctor or pharmacy?: 1 - Never   Diabetes:  Is the patient diabetic?  Yes type 2 If diabetic, was a CBG obtained today?  No  Did the patient bring in their glucometer from home?  No  How often do you monitor your CBG's? Once daily.   Financial Strains and Diabetes Management:  Are you having any financial strains with the device, your supplies or your medication? No .  Does the patient want to be seen by Chronic Care Management for management of their diabetes?  No  Would the patient like to be referred to a Nutritionist or for Diabetic Management?  No   Diabetic Exams:  Diabetic Eye Exam: Completed 05/29/17. Overdue for diabetic eye exam. Pt has been advised about the importance in completing this exam.   Diabetic Foot Exam: Completed 08/30/17.    Interpreter Needed?: No  [facility]  Past Medical History:  Diagnosis Date  . Allergy   . Arthritis   . Asthma   . Colon polyp   . Fatty liver   . GERD (gastroesophageal reflux disease)   . Hyperlipidemia   . Hypertension   . Motion sickness    boats   Past Surgical History:  Procedure Laterality Date  . ABDOMINAL HYSTERECTOMY  1978  . COLONOSCOPY WITH PROPOFOL N/A 02/01/2015   Procedure: COLONOSCOPY WITH PROPOFOL;  Surgeon: DLucilla Lame MD;  [place]  Service: Endoscopy;  Laterality: N/A;  Diabetic - oral meds  . COLONOSCOPY WITH PROPOFOL N/A 11/22/2017   Procedure: COLONOSCOPY WITH PROPOFOL;  Surgeon: Lin Landsman, MD;  Location: [facility] ENDOSCOPY;  Service: Gastroenterology;  Laterality: N/A;  . ESOPHAGOGASTRODUODENOSCOPY (EGD) WITH PROPOFOL N/A 07/12/2017   Procedure: ESOPHAGOGASTRODUODENOSCOPY (EGD) WITH PROPOFOL;  Surgeon: Lin Landsman, MD;  Location: [facility] ENDOSCOPY;  Service: Gastroenterology;  Laterality: N/A;  . ESOPHAGOGASTRODUODENOSCOPY (EGD) WITH PROPOFOL N/A 08/16/2017   Procedure: ESOPHAGOGASTRODUODENOSCOPY (EGD) WITH PROPOFOL;  Surgeon:  Lin Landsman, MD;  Location: [facility] ENDOSCOPY;  Service: Gastroenterology;  Laterality: N/A;  . ESOPHAGOGASTRODUODENOSCOPY (EGD) WITH PROPOFOL N/A 09/20/2017   Procedure: ESOPHAGOGASTRODUODENOSCOPY (EGD) WITH PROPOFOL;  Surgeon: Lin Landsman, MD;  Location: [facility] ENDOSCOPY;  Service: Gastroenterology;  Laterality: N/A;  . ESOPHAGOGASTRODUODENOSCOPY (EGD) WITH PROPOFOL N/A 11/01/2017   Procedure: ESOPHAGOGASTRODUODENOSCOPY (EGD) WITH PROPOFOL;  Surgeon: Lin Landsman, MD;  Location: [facility] ENDOSCOPY;  Service: Gastroenterology;  Laterality: N/A;  . ESOPHAGOGASTRODUODENOSCOPY (EGD) WITH PROPOFOL N/A 01/17/2018   Procedure: ESOPHAGOGASTRODUODENOSCOPY (EGD) WITH PROPOFOL;  Surgeon: Lin Landsman, MD;  Location: [facility] ENDOSCOPY;  Service: Gastroenterology;  Laterality: N/A;  . ESOPHAGOGASTRODUODENOSCOPY (EGD) WITH PROPOFOL N/A 06/20/2018   Procedure: ESOPHAGOGASTRODUODENOSCOPY (EGD) WITH PROPOFOL;  Surgeon: Lin Landsman, MD;  Location: [facility] ENDOSCOPY;  Service: Gastroenterology;  Laterality: N/A;  EGD/ with banding ligation   . IR RADIOLOGIST EVAL & MGMT  07/09/2018  . POLYPECTOMY  02/01/2015   Procedure: POLYPECTOMY;  Surgeon: Lucilla Lame, MD;  Location: [place];  Service: Endoscopy;;  . TONSILLECTOMY AND ADENOIDECTOMY     Family History  Problem Relation Age of Onset  . Dementia Mother   . Alcohol abuse Father   . Cancer Sister        lung  . Lymphoma Sister   . Lymphoma Sister    Social History   Socioeconomic History  . Marital status: Widowed    Spouse name: Not on file  . Number of children: 2  . Years of education: Not on file  . Highest education level: Not on file  Occupational History  . Occupation: retired  Scientific laboratory technician  . Financial resource strain: Somewhat hard  . Food insecurity:    Worry: Never true    Inability: Never true  . Transportation needs:    Medical: No    Non-medical: No  Tobacco Use  . Smoking status: Former Smoker     Packs/day: 0.50    Years: 6.00    Pack years: 3.00    Types: Cigarettes    Last attempt to quit: 05/08/1987    Years since quitting: 31.3  . Smokeless tobacco: Never Used  Substance and Sexual Activity  . Alcohol use: Never    Frequency: Never  . Drug use: Never  . Sexual activity: Not on file  Lifestyle  . Physical activity:    Days per week: 0 days    Minutes per session: 0 min  . Stress: Not at all  Relationships  . Social connections:    Talks on phone: Patient refused    Gets together: Patient refused    Attends religious service: Patient refused    Active member of club or organization: Patient refused    Attends meetings of clubs or organizations: Patient refused    Relationship status: Patient refused  Other Topics Concern  . Not on file  Social History Narrative  . Not on file    Outpatient Encounter Medications as of 09/11/2018  Medication Sig  . ergocalciferol (VITAMIN D2) 50000 units capsule  Take 50,000 Units by mouth once a week. Every Friday when remembers  . furosemide (LASIX) 20 MG tablet Take 0.5 tablets (10 mg total) by mouth daily. (Patient taking differently: Take 20 mg by mouth daily. )  . glucose blood (ACCU-CHEK AVIVA PLUS) test strip Test fasting blood sugar only once each morning.  . nadolol (CORGARD) 20 MG tablet Take 1 tablet (20 mg total) by mouth daily.  . pantoprazole (PROTONIX) 40 MG tablet Take 1 tablet (40 mg total) by mouth 2 (two) times daily.  Marland Kitchen spironolactone (ALDACTONE) 100 MG tablet Take 1 tablet (100 mg total) by mouth daily.   No facility-administered encounter medications on file as of 09/11/2018.     Activities of Daily Living In your present state of health, do you have any difficulty performing the following activities: 09/11/2018 01/31/2018  Hearing? N N  Vision? N N  Difficulty concentrating or making decisions? N N  Walking or climbing stairs? N N  Dressing or bathing? N N  Doing errands, shopping? N N  Preparing Food and  eating ? N -  Using the Toilet? N -  In the past six months, have you accidently leaked urine? N -  Do you have problems with loss of bowel control? N -  Managing your Medications? N -  Managing your Finances? N -  Housekeeping or managing your Housekeeping? N -  Some recent data might be hidden    Patient Care Team: Mar Daring, PA-C as PCP - General (Family Medicine) Byrnett, Forest Gleason, MD (General Surgery) Anell Barr, OD as Consulting Physician (Optometry) Cammie Sickle, MD as Consulting Physician (Internal Medicine) Lin Landsman, MD as Consulting Physician (Gastroenterology) Sandi Mariscal, MD as Consulting Physician (Interventional Radiology)    Assessment:   This is a routine wellness examination for [place].  Exercise Activities and Dietary recommendations Current Exercise Habits: The patient does not participate in regular exercise at present, Exercise limited by: None identified  Goals    . Exercise      Recommend walking 3 days a week for at least 30 minutes.     . Exercise 150 minutes per week (moderate activity)    . Exercise 3x per week (30 min per time)     Recommend to start walking 3 days a week for 30 minutes.     . Prevent falls     Recommend to remove any items from the home that may cause slips or trips.       Fall Risk: Fall Risk  09/11/2018 08/23/2017 07/27/2016 07/11/2016 01/18/2015  Falls in the past year? 1 No No No No  Number falls in past yr: 0 - - - -  Injury with Fall? 1 - - - -  Comment Broke pelvis in 09/19. - - - -  Follow up Falls prevention discussed - - - -    FALL RISK PREVENTION PERTAINING TO THE HOME:  Any stairs in or around the home? No  If so, are there any without handrails? N/A  Home free of loose throw rugs in walkways, pet beds, electrical cords, etc? Yes  Adequate lighting in your home to reduce risk of falls? Yes   ASSISTIVE DEVICES UTILIZED TO PREVENT FALLS:  Life alert? No  Use of a cane,  walker or w/c? No  Grab bars in the bathroom? No  Shower chair or bench in shower? No  Elevated toilet seat or a handicapped toilet? Yes   TIMED UP AND GO:  Was  the test performed? No .    Depression Screen PHQ 2/9 Scores 09/11/2018 09/11/2018 08/23/2017 08/23/2017  PHQ - 2 Score 0 0 0 0  PHQ- 9 Score 1 - 4 -     Cognitive Function     6CIT Screen 09/11/2018 07/27/2016  What Year? 0 points 0 points  What month? 0 points 0 points  What time? 0 points 0 points  Count back from 20 0 points 0 points  Months in reverse 0 points 0 points  Repeat phrase 0 points 4 points  Total Score 0 4    Immunization History  Administered Date(s) Administered  . Hepatitis A 07/10/2011, 12/25/2011  . Hepatitis B 07/10/2011, 08/11/2011, 12/25/2011  . Influenza Split 02/24/2010, 03/27/2012  . Influenza, High Dose Seasonal PF 01/18/2015, 02/19/2016, 02/05/2017, 03/04/2018  . Pneumococcal Conjugate-13 07/13/2014  . Pneumococcal Polysaccharide-23 01/27/2009  . Td 04/08/1996  . Tdap 02/24/2010, 06/17/2018    Qualifies for Shingles Vaccine? Yes . Due for Shingrix. Education has been provided regarding the importance of this vaccine. Pt has been advised to call insurance company to determine out of pocket expense. Advised may also receive vaccine at local pharmacy or Health Dept. Verbalized acceptance and understanding.  Tdap: Up to date  Flu Vaccine: Up to date  Pneumococcal Vaccine: Up to date  Screening Tests Health Maintenance  Topic Date Due  . HEMOGLOBIN A1C  03/01/2018  . OPHTHALMOLOGY EXAM  03/27/2018  . FOOT EXAM  08/31/2018  . URINE MICROALBUMIN  08/31/2018  . INFLUENZA VACCINE  12/07/2018  . DEXA SCAN  01/27/2020  . COLONOSCOPY  11/22/2020  . TETANUS/TDAP  06/17/2028  . PNA vac Low Risk Adult  Completed    Cancer Screenings:  Colorectal Screening: Completed 11/22/17. Repeat every 3 years.  Mammogram: Completed 03/23/17.   Bone Density: Completed 01/27/15. Results reflect  OSTEOPENIA. Repeat every 5 years. .  Lung Cancer Screening: (Low Dose CT Chest recommended if Age 60-80 years, 30 pack-year currently smoking OR have quit w/in 15years.) does not qualify.   Additional Screening:  Dental Screening: Recommended annual dental exams for proper oral hygiene   Community Resource Referral:  CRR required this visit?  No       Plan:  I have personally reviewed and addressed the Medicare Annual Wellness questionnaire and have noted the following in the patient's chart:  A. Medical and social history B. Use of alcohol, tobacco or illicit drugs  C. Current medications and supplements D. Functional ability and status E.  Nutritional status F.  Physical activity G. Advance directives H. List of other physicians I.  Hospitalizations, surgeries, and ER visits in previous 12 months J.  [place] such as hearing and vision if needed, cognitive and depression L. Referrals and appointments   In addition, I have reviewed and discussed with patient certain preventive protocols, quality metrics, and best practice recommendations. A written personalized care plan for preventive services as well as general preventive health recommendations were provided to patient.   Glendora Score, [place]  06/11/8248 Nurse Health Advisor   Nurse Notes: Pt needs a diabetic foot exam, Hgb A1c checked and urine check at next OV. Pt plans to set up an eye exam this year.

## 2018-09-12 ENCOUNTER — Other Ambulatory Visit: Payer: Self-pay

## 2018-09-12 ENCOUNTER — Other Ambulatory Visit: Payer: Self-pay | Admitting: Respiratory Therapy

## 2018-09-12 ENCOUNTER — Ambulatory Visit
Admission: RE | Admit: 2018-09-12 | Discharge: 2018-09-12 | Disposition: A | Discharge status: Home / Self Care | Payer: Medicare HMO | Source: Ambulatory Visit | Attending: Interventional Radiology | Admitting: Interventional Radiology

## 2018-09-12 DIAGNOSIS — K219 Gastro-esophageal reflux disease without esophagitis: Secondary | ICD-10-CM | POA: Diagnosis not present

## 2018-09-12 DIAGNOSIS — I10 Essential (primary) hypertension: Secondary | ICD-10-CM | POA: Insufficient documentation

## 2018-09-12 DIAGNOSIS — E785 Hyperlipidemia, unspecified: Secondary | ICD-10-CM | POA: Diagnosis not present

## 2018-09-12 DIAGNOSIS — K7290 Hepatic failure, unspecified without coma: Secondary | ICD-10-CM | POA: Diagnosis not present

## 2018-09-12 DIAGNOSIS — I340 Nonrheumatic mitral (valve) insufficiency: Secondary | ICD-10-CM | POA: Diagnosis not present

## 2018-09-12 DIAGNOSIS — I200 Unstable angina: Secondary | ICD-10-CM | POA: Diagnosis not present

## 2018-09-12 NOTE — Progress Notes (Signed)
*  PRELIMINARY RESULTS* Echocardiogram 2D Echocardiogram has been performed.  Sherrie Sport 09/12/2018, 10:43 AM

## 2018-09-20 DIAGNOSIS — S32511D Fracture of superior rim of right pubis, subsequent encounter for fracture with routine healing: Secondary | ICD-10-CM | POA: Diagnosis not present

## 2018-09-24 ENCOUNTER — Telehealth: Payer: Self-pay | Admitting: *Deleted

## 2018-09-24 NOTE — Telephone Encounter (Signed)
Jennifer [name] was have a TIPS procedure. The procedure was delayed due to COVID 19. I s/w Ms. Erck, she states" I feel wonderful,doing yard work and eating right. I have decided I do not need the procedure". I advised if she has any problems to give our office a call./vm

## 2018-10-21 DIAGNOSIS — S32511D Fracture of superior rim of right pubis, subsequent encounter for fracture with routine healing: Secondary | ICD-10-CM | POA: Diagnosis not present

## 2018-10-28 ENCOUNTER — Other Ambulatory Visit: Payer: Self-pay | Admitting: Physician Assistant

## 2018-10-28 DIAGNOSIS — K219 Gastro-esophageal reflux disease without esophagitis: Secondary | ICD-10-CM

## 2018-11-01 ENCOUNTER — Telehealth: Payer: Self-pay | Admitting: Physician Assistant

## 2018-11-01 NOTE — Chronic Care Management (AMB) (Signed)
Chronic Care Management   Note  11/01/2018 Name: Jennifer [name] MRN: [id number] DOB: [birth year]  Jennifer [name] is a 78 y.o. year old female who is a primary care patient of Rubye Beach. I reached out to [name] by phone today in response to a referral sent by [name]'s health plan.    [name] was given information about Chronic Care Management services today including:  1. CCM service includes personalized support from designated clinical staff supervised by her physician, including individualized plan of care and coordination with other care providers 2. 24/7 contact phone numbers for assistance for urgent and routine care needs. 3. Service will only be billed when office clinical staff spend 20 minutes or more in a month to coordinate care. 4. Only one practitioner may furnish and bill the service in a calendar month. 5. The patient may stop CCM services at any time (effective at the end of the month) by phone call to the office staff. 6. The patient will be responsible for cost sharing (co-pay) of up to 20% of the service fee (after annual deductible is met).  Patient agreed to services and verbal consent obtained.   Follow up plan: Telephone appointment with CCM team member scheduled for: 11/21/2018  [place]  ??[email or web address]   ??[phone]

## 2018-11-02 ENCOUNTER — Other Ambulatory Visit: Payer: Self-pay | Admitting: Physician Assistant

## 2018-11-02 DIAGNOSIS — M7989 Other specified soft tissue disorders: Secondary | ICD-10-CM

## 2018-11-11 ENCOUNTER — Encounter: Payer: Self-pay | Admitting: Physician Assistant

## 2018-11-12 ENCOUNTER — Telehealth: Payer: Self-pay

## 2018-11-12 NOTE — Telephone Encounter (Signed)
Patient said to let you know she's doing well, and she is not going to have the shunt placed.  She is leaving it in God's hands and she is very comfortable with her decision.  She's happy and feeling good.  She said she loves you.  Thanks [employer]

## 2018-11-20 DIAGNOSIS — S32511D Fracture of superior rim of right pubis, subsequent encounter for fracture with routine healing: Secondary | ICD-10-CM | POA: Diagnosis not present

## 2018-11-21 ENCOUNTER — Telehealth: Payer: Medicare HMO

## 2018-11-21 ENCOUNTER — Ambulatory Visit: Payer: Self-pay | Admitting: Pharmacist

## 2018-11-21 NOTE — Chronic Care Management (AMB) (Signed)
  Chronic Care Management   Note  11/21/2018 Name: Jennifer [name] MRN: [id number] DOB: 08-08-1940  78 y.o. year old female referred to Chronic Care Management by patient's health plan.  Last office visit with Mar Daring, PA-C was 06/19/18.   Was unable to reach patient via telephone today and have left HIPAA compliant voicemail asking patient to return my call. (unsuccessful outreach #1).  Follow up plan: A HIPPA compliant phone message was left for the patient providing contact information and requesting a return call.  The care management team will reach out to the patient again over the next 5-7 days.   Ruben Reason, PharmD Clinical Pharmacist [place] [phone]

## 2018-11-22 ENCOUNTER — Ambulatory Visit (INDEPENDENT_AMBULATORY_CARE_PROVIDER_SITE_OTHER): Payer: Medicare HMO | Admitting: Physician Assistant

## 2018-11-22 ENCOUNTER — Other Ambulatory Visit: Payer: Self-pay

## 2018-11-22 ENCOUNTER — Encounter: Payer: Self-pay | Admitting: Physician Assistant

## 2018-11-22 VITALS — BP 112/70 | HR 67 | Temp 98.5°F | Resp 16 | Wt 169.5 lb

## 2018-11-22 DIAGNOSIS — M1712 Unilateral primary osteoarthritis, left knee: Secondary | ICD-10-CM | POA: Diagnosis not present

## 2018-11-22 MED ORDER — METHYLPREDNISOLONE ACETATE 40 MG/ML IJ SUSP
80.0000 mg | Freq: Once | INTRAMUSCULAR | Status: AC
  Administered 2018-11-22: 80 mg via INTRA_ARTICULAR

## 2018-11-22 NOTE — Progress Notes (Signed)
Patient: Jennifer [name] Female    DOB: 1940-12-28   78 y.o.   MRN: [id number] Visit Date: 11/22/2018  Today's Provider: Mar Daring, PA-C   Chief Complaint  Patient presents with  . Knee Pain   Subjective:    I,Joseline E. Rosas,RMA am acting as a Education administrator for [employer], PA-C.  Knee Pain  Incident onset: This knee condition is chronic. There was no injury mechanism. The pain is present in the left knee. The pain is moderate. The pain has been constant since onset. Pertinent negatives include no numbness. She reports no foreign bodies present. The symptoms are aggravated by movement. Treatments tried: Putting a pillow under her knee at night.  She is asking for a steroid shot on her knee.   Allergies  Allergen Reactions  . Amoxicillin Other (See Comments)    Yeast infections  . Codeine Other (See Comments)    Pt denies  . Hydrocodone-Acetaminophen Other (See Comments)    Pt denies  . Nitrofurantoin Other (See Comments)  . Nitrofurantoin Monohyd Macro     "Sugar got high"  . Penicillins     Has patient had a PCN reaction causing immediate rash, facial/tongue/throat swelling, SOB or lightheadedness with hypotension: Unknown Has patient had a PCN reaction causing severe rash involving mucus membranes or skin necrosis: Unknown Has patient had a PCN reaction that required hospitalization: Unknown Has patient had a PCN reaction occurring within the last 10 years: Unknown If all of the above answers are "NO", then may proceed with Cephalosporin use.  . Sulfa Antibiotics Other (See Comments)     Current Outpatient Medications:  .  ergocalciferol (VITAMIN D2) 50000 units capsule, Take 50,000 Units by mouth once a week. Every Friday when remembers, Disp: , Rfl:  .  furosemide (LASIX) 20 MG tablet, TAKE ONE-HALF TABLET BY MOUTH EVERY DAY, Disp: 45 tablet, Rfl: 1 .  glucose blood (ACCU-CHEK AVIVA PLUS) test strip, Test fasting blood sugar only once each morning.,  Disp: 100 each, Rfl: 3 .  nadolol (CORGARD) 20 MG tablet, Take 1 tablet (20 mg total) by mouth daily., Disp: 90 tablet, Rfl: 0 .  pantoprazole (PROTONIX) 40 MG tablet, TAKE 1 TABLET BY MOUTH TWICE DAILY, Disp: 180 tablet, Rfl: 1 .  spironolactone (ALDACTONE) 100 MG tablet, Take 1 tablet (100 mg total) by mouth daily., Disp: 90 tablet, Rfl: 3  Review of Systems  Constitutional: Negative.   HENT: Negative.   Respiratory: Negative.   Cardiovascular: Negative.   Musculoskeletal: Positive for arthralgias and joint swelling.  Neurological: Negative for weakness and numbness.    Social History   Tobacco Use  . Smoking status: Former Smoker    Packs/day: 0.50    Years: 6.00    Pack years: 3.00    Types: Cigarettes    Quit date: 05/08/1987    Years since quitting: 31.5  . Smokeless tobacco: Never Used  Substance Use Topics  . Alcohol use: Never    Frequency: Never      Objective:   BP 112/70 (BP Location: Left Arm, Patient Position: Sitting, Cuff Size: Normal)   Pulse 67   Temp 98.5 F (36.9 C) (Oral)   Resp 16   Wt 169 lb 8 oz (76.9 kg)   BMI 32.03 kg/m  Vitals:   11/22/18 1057  BP: 112/70  Pulse: 67  Resp: 16  Temp: 98.5 F (36.9 C)  TempSrc: Oral  Weight: 169 lb 8 oz (76.9 kg)  Physical Exam Vitals signs reviewed.  Constitutional:      General: She is not in acute distress.    Appearance: Normal appearance. She is well-developed. She is not ill-appearing.  HENT:     Head: Normocephalic and atraumatic.  Neck:     Musculoskeletal: Normal range of motion and neck supple.  Pulmonary:     Effort: Pulmonary effort is normal. No respiratory distress.  Musculoskeletal:     Left knee: She exhibits decreased range of motion. She exhibits no swelling, no effusion, no erythema, normal alignment, no LCL laxity, normal patellar mobility, no bony tenderness, normal meniscus and no MCL laxity. Tenderness found. Medial joint line and lateral joint line tenderness noted.   Neurological:     Mental Status: She is alert.  Psychiatric:        Behavior: Behavior normal.        Thought Content: Thought content normal.        Judgment: Judgment normal.     No results found for any visits on 11/22/18.     Assessment & Plan    1. Primary osteoarthritis of left knee Steroid injection given as below in the left knee. Tolerated well. See procedure note below. After care instructions printed on AVS. Call if not improving.  - methylPREDNISolone acetate (DEPO-MEDROL) injection 80 mg  Procedure Note: Benefits, risks (including infection, tattooing, adipose dimpling, and tendon rupture) and alternatives were explained to the patient. All questions were sought and answered.  Patient agreed to continue and verbal consent was obtained.   An aspiration and steroid injection was performed on leftt knee using 4cc of 1% plain Xyloocaine and 80 mg of depo-medrol. There was minimal bleeding. Hemostasis was intact. A dry dressing was applied. The procedure was well tolerated.    Mar Daring, PA-C  [facility]

## 2018-11-22 NOTE — Patient Instructions (Signed)
Knee Injection A knee injection is a procedure to get medicine into your knee joint to relieve the pain, swelling, and stiffness of arthritis. Your health care provider uses a needle to inject medicine, which may also help to lubricate and cushion your knee joint. You may need more than one injection. Tell a health care provider about:  Any allergies you have.  All medicines you are taking, including vitamins, herbs, eye drops, creams, and over-the-counter medicines.  Any problems you or family members have had with anesthetic medicines.  Any blood disorders you have.  Any surgeries you have had.  Any medical conditions you have.  Whether you are pregnant or may be pregnant. What are the risks? Generally, this is a safe procedure. However, problems may occur, including:  Infection.  Bleeding.  Symptoms that get worse.  Damage to the area around your knee.  Allergic reaction to any of the medicines.  Skin reactions from repeated injections. What happens before the procedure?  Ask your health care provider about changing or stopping your regular medicines. This is especially important if you are taking diabetes medicines or blood thinners.  Plan to have someone take you home from the hospital or clinic. What happens during the procedure?   You will sit or lie down in a position for your knee to be treated.  The skin over your kneecap will be cleaned with a germ-killing soap.  You will be given a medicine that numbs the area (local anesthetic). You may feel some stinging.  The medicine will be injected into your knee. The needle is carefully placed between your kneecap and your knee. The medicine is injected into the joint space.  The needle will be removed at the end of the procedure.  A bandage (dressing) may be placed over the injection site. The procedure may vary among health care providers and hospitals. What can I expect after the procedure?  Your blood  pressure, heart rate, breathing rate, and blood oxygen level will be monitored until you leave the hospital or clinic.  You may have to move your knee through its full range of motion. This helps to get all the medicine into your joint space.  You will be watched to make sure that you do not have a reaction to the injected medicine.  You may feel more pain, swelling, and warmth than you did before the injection. This reaction may last about 1-2 days. Follow these instructions at home: Medicines  Take over-the-counter and prescription medicines only as told by your doctor.  Do not drive or use heavy machinery while taking prescription pain medicine.  Do not take medicines such as aspirin and ibuprofen unless your health care provider tells you to take them. Injection site care  Follow instructions from your health care provider about: ? How to take care of your puncture site. ? When and how you should change your dressing. ? When you should remove your dressing.  Check your injection area every day for signs of infection. Check for: ? More redness, swelling, or pain after 2 days. ? Fluid or blood. ? Pus or a bad smell. ? Warmth. Managing pain, stiffness, and swelling   If directed, put ice on the injection area: ? Put ice in a plastic bag. ? Place a towel between your skin and the bag. ? Leave the ice on for 20 minutes, 2-3 times per day.  Do not apply heat to your knee.  Raise (elevate) the injection area above the level  of your heart while you are sitting or lying down. General instructions  If you were given a dressing, keep it dry until your health care provider says it can be removed. Ask your health care provider when you can start showering or taking a bath.  Avoid strenuous activities for as long as directed by your health care provider. Ask your health care provider when you can return to your normal activities.  Keep all follow-up visits as told by your health  care provider. This is important. You may need more injections. Contact a health care provider if you have:  A fever.  Warmth in your injection area.  Fluid, blood, or pus coming from your injection site.  Symptoms at your injection site that last longer than 2 days after your procedure. Get help right away if:  Your knee: ? Turns very red. ? Becomes very swollen. ? Is in severe pain. Summary  A knee injection is a procedure to get medicine into your knee joint to relieve the pain, swelling, and stiffness of arthritis.  A needle is carefully placed between your kneecap and your knee to inject medicine into the joint space.  Before the procedure, ask your health care provider about changing or stopping your regular medicines, especially if you are taking diabetes medicines or blood thinners.  Contact your health care provider if you have any problems or questions after your procedure. This information is not intended to replace advice given to you by your health care provider. Make sure you discuss any questions you have with your health care provider. Document Released: 07/16/2006 Document Revised: 05/14/2017 Document Reviewed: 05/14/2017 Elsevier Patient Education  2020 [employer].

## 2018-11-25 ENCOUNTER — Ambulatory Visit: Payer: Medicare HMO | Admitting: Pharmacist

## 2018-11-25 DIAGNOSIS — M7989 Other specified soft tissue disorders: Secondary | ICD-10-CM

## 2018-11-25 DIAGNOSIS — K7460 Unspecified cirrhosis of liver: Secondary | ICD-10-CM

## 2018-11-25 DIAGNOSIS — K219 Gastro-esophageal reflux disease without esophagitis: Secondary | ICD-10-CM

## 2018-11-25 DIAGNOSIS — I10 Essential (primary) hypertension: Secondary | ICD-10-CM

## 2018-11-25 NOTE — Patient Instructions (Signed)
  Thank you allowing the Chronic Care Management Team to be a part of your care!   Please call a member of the CCM (Chronic Care Management) Team with any questions or case management needs:   Vanetta Mulders, BSN Nurse Care Coordinator  [phone]  Ruben Reason, PharmD  Clinical Pharmacist  [phone]  [place], LCSW Clinical Social Worker [phone]  Goals Addressed   None     The patient verbalized understanding of instructions provided today and declined a print copy of patient instruction materials.

## 2018-11-25 NOTE — Chronic Care Management (AMB) (Signed)
Chronic Care Management   Note  11/26/2018 Name: Jennifer [name] MRN: [id number] DOB: Jan 02, 1941  Subjective:  Jennifer [name] is a 78 year old female patient of [name], [place]. Patient was referred to [facility] clinical pharmacy services by health plan. Successful outreach today for initial pharmacy consult via telephone. HIPAA identifiers verified.  Patient requesting new glucometer and is concerned with leg cramps at night. She typically resolves leg cramps with a spoonful of mustard.   Objective: Lab Results  Component Value Date   CREATININE 1.00 07/24/2018   CREATININE 0.87 07/17/2018   CREATININE 0.98 (H) 07/09/2018    Lab Results  Component Value Date   HGBA1C 6.0 08/30/2017    Lipid Panel     Component Value Date/Time   CHOL 195 08/30/2017 1008   TRIG 85 08/30/2017 1008   HDL 46 08/30/2017 1008   CHOLHDL 4.2 08/30/2017 1008   LDLCALC 132 (H) 08/30/2017 1008    BP Readings from Last 3 Encounters:  11/22/18 112/70  07/17/18 127/81  07/09/18 113/63    Allergies  Allergen Reactions  . Amoxicillin Other (See Comments)    Yeast infections  . Codeine Other (See Comments)    Pt denies  . Hydrocodone-Acetaminophen Other (See Comments)    Pt denies  . Nitrofurantoin Other (See Comments)  . Nitrofurantoin Monohyd Macro     "Sugar got high"  . Penicillins     Has patient had a PCN reaction causing immediate rash, facial/tongue/throat swelling, SOB or lightheadedness with hypotension: Unknown Has patient had a PCN reaction causing severe rash involving mucus membranes or skin necrosis: Unknown Has patient had a PCN reaction that required hospitalization: Unknown Has patient had a PCN reaction occurring within the last 10 years: Unknown If all of the above answers are "NO", then may proceed with Cephalosporin use.  . Sulfa Antibiotics Other (See Comments)    Medications Reviewed Today    Reviewed by Rubye Beach (Physician Assistant Certified)  on 60/04/59 at 1251  Med List Status: <None>  Medication Order Taking? Sig Documenting Provider Last Dose Status Informant  ergocalciferol (VITAMIN D2) 50000 units capsule [id number] Yes Take 50,000 Units by mouth once a week. Every Friday when remembers [provider] Taking Active   furosemide (LASIX) 20 MG tablet [id number] Yes TAKE ONE-HALF TABLET BY MOUTH EVERY DAY Mar Daring, PA-C Taking Active   glucose blood (ACCU-CHEK AVIVA PLUS) test strip [id number] Yes Test fasting blood sugar only once each morning. Chrismon, Vickki Muff, PA Taking Active   nadolol (CORGARD) 20 MG tablet [id number] Yes Take 1 tablet (20 mg total) by mouth daily. Lin Landsman, MD Taking Active   pantoprazole (PROTONIX) 40 MG tablet [id number] Yes TAKE 1 TABLET BY MOUTH TWICE DAILY Mar Daring, PA-C Taking Active   spironolactone (ALDACTONE) 100 MG tablet [id number] Yes Take 1 tablet (100 mg total) by mouth daily. Mar Daring, PA-C Taking Active            Assessment:    Goals Addressed            This Visit's Progress   . I want to be sure I'm taking the right medicines (pt-stated)       Current Barriers:  Marland Kitchen Knowledge Deficits related to use for each medication  Pharmacist Clinical Goal(s):  Marland Kitchen Over the next 30 days, patient will demonstrate improved understanding of prescribed medications and rationale for usage as evidenced by patient report  Interventions: . Comprehensive medication review  performed. . Advised patient to use OTC magnesium supplements 423m nightly for leg cramping  . Discussed plans with patient for ongoing care management follow up and provided patient with direct contact information for care management team . Collaboration with provider re: medication management o Patient needs new diabetes testing supplies (glucometer, testing strips, lancets) sent to [facility] pharmacy  Patient Self Care Activities:  . Self administers medications as  prescribed . Calls provider office for new concerns or questions . Try Magnesium [id number] for leg cramps  Initial goal documentation        Plan: Recommendations discussed with provider - Patient requesting new glucometer and testing supplies sent to [facility] pharmacy; AccuChek Nano OR Aviva Plus brands [facility]   Recommendations discussed with patient - Magnesium 40067mt night for cramps;   Follow up: Telephone follow up appointment with care management team member scheduled for: 2 weeks with PharmD  JulRuben ReasonharmD Clinical Pharmacist [phone]

## 2018-11-27 ENCOUNTER — Encounter: Payer: Self-pay | Admitting: Physician Assistant

## 2018-11-27 ENCOUNTER — Ambulatory Visit (INDEPENDENT_AMBULATORY_CARE_PROVIDER_SITE_OTHER): Payer: Medicare HMO | Admitting: Physician Assistant

## 2018-11-27 ENCOUNTER — Other Ambulatory Visit: Payer: Self-pay

## 2018-11-27 VITALS — BP 108/60 | HR 62 | Temp 97.9°F | Ht 61.0 in | Wt 169.0 lb

## 2018-11-27 DIAGNOSIS — E611 Iron deficiency: Secondary | ICD-10-CM | POA: Diagnosis not present

## 2018-11-27 DIAGNOSIS — Z8719 Personal history of other diseases of the digestive system: Secondary | ICD-10-CM

## 2018-11-27 DIAGNOSIS — K7581 Nonalcoholic steatohepatitis (NASH): Secondary | ICD-10-CM

## 2018-11-27 DIAGNOSIS — Z0000 Encounter for general adult medical examination without abnormal findings: Secondary | ICD-10-CM | POA: Diagnosis not present

## 2018-11-27 DIAGNOSIS — I10 Essential (primary) hypertension: Secondary | ICD-10-CM

## 2018-11-27 DIAGNOSIS — E559 Vitamin D deficiency, unspecified: Secondary | ICD-10-CM | POA: Diagnosis not present

## 2018-11-27 DIAGNOSIS — K7460 Unspecified cirrhosis of liver: Secondary | ICD-10-CM | POA: Diagnosis not present

## 2018-11-27 DIAGNOSIS — E1142 Type 2 diabetes mellitus with diabetic polyneuropathy: Secondary | ICD-10-CM

## 2018-11-27 MED ORDER — NADOLOL 20 MG PO TABS: 10.0000 mg | ORAL_TABLET | Freq: Every day | ORAL | 3 refills | Status: DC

## 2018-11-27 NOTE — Progress Notes (Signed)
Patient: Jennifer [name], Female    DOB: 08/04/1940, 78 y.o.   MRN: [id number] Visit Date: 11/27/2018  Today's Provider: Mar Daring, PA-C   Chief Complaint  Patient presents with  . Annual Exam   Subjective:  Jennifer [name] is a 78 y.o. female who presents today for health maintenance and complete physical. She feels well. She reports exercising none. She reports she is sleeping well.  09/11/18 Annual Wellness with Nurse Health Advisor 11/22/17 Colonoscopy, Dr Elliot Dally 5 years 03/23/17 Mammogram-normal; patient refuses all others 01/27/15 BMD-Osteopenia; repeat in 5 years  S/P Hysterectomy  Immunization History  Administered Date(s) Administered  . Hepatitis A 07/10/2011, 12/25/2011  . Hepatitis B 07/10/2011, 08/11/2011, 12/25/2011  . Influenza Split 02/24/2010, 03/27/2012  . Influenza, High Dose Seasonal PF 01/18/2015, 02/19/2016, 02/05/2017, 03/04/2018  . Pneumococcal Conjugate-13 07/13/2014  . Pneumococcal Polysaccharide-23 01/27/2009  . Td 04/08/1996  . Tdap 02/24/2010, 06/17/2018    Review of Systems  Constitutional: Negative.   HENT: Negative.   Eyes: Negative.   Respiratory: Negative.   Cardiovascular: Negative.   Gastrointestinal: Negative.   Endocrine: Negative.   Genitourinary: Negative.   Musculoskeletal: Negative.   Skin: Negative.   Allergic/Immunologic: Negative.   Neurological: Negative.   Hematological: Negative.   Psychiatric/Behavioral: Negative.     Social History   Socioeconomic History  . Marital status: Widowed    Spouse name: Not on file  . Number of children: 2  . Years of education: Not on file  . Highest education level: Not on file  Occupational History  . Occupation: retired  Scientific laboratory technician  . Financial resource strain: Somewhat hard  . Food insecurity    Worry: Never true    Inability: Never true  . Transportation needs    Medical: No    Non-medical: No  Tobacco Use  . Smoking status: Former Smoker    Packs/day: 0.50     Years: 6.00    Pack years: 3.00    Types: Cigarettes    Quit date: 05/08/1987    Years since quitting: 31.5  . Smokeless tobacco: Never Used  Substance and Sexual Activity  . Alcohol use: Never    Frequency: Never  . Drug use: Never  . Sexual activity: Not on file  Lifestyle  . Physical activity    Days per week: 0 days    Minutes per session: 0 min  . Stress: Not at all  Relationships  . Social Herbalist on phone: Patient refused    Gets together: Patient refused    Attends religious service: Patient refused    Active member of club or organization: Patient refused    Attends meetings of clubs or organizations: Patient refused    Relationship status: Patient refused  . Intimate partner violence    Fear of current or ex partner: Patient refused    Emotionally abused: Patient refused    Physically abused: Patient refused    Forced sexual activity: Patient refused  Other Topics Concern  . Not on file  Social History Narrative  . Not on file    Patient Active Problem List   Diagnosis Date Noted  . Essential hypertension, benign 02/04/2018  . Iron deficiency 02/04/2018  . Pelvic fracture ([place]) 01/31/2018  . Multiple gastric polyps   . Hx of colonic polyps   . Esophageal varices without bleeding ([place])   . Acute upper GI bleed 07/12/2017  . Liver cirrhosis secondary to NASH ([place])   . Carpal tunnel syndrome  on right 09/28/2016  . Obesity (BMI 30.0-34.9) 09/20/2016  . Upper respiratory infection 03/17/2016  . Thrombocytopenia ([place]) 12/29/2015  . Benign neoplasm of ascending colon   . Benign neoplasm of transverse colon   . Benign neoplasm of sigmoid colon   . Allergic rhinitis 10/14/2014  . Airway hyperreactivity 10/14/2014  . Controlled diabetes mellitus type II without complication ([place]) 46/80/3212  . Calculus of gallbladder 10/14/2014  . Esophagitis, reflux 10/14/2014  . Vitamin D deficiency 10/14/2014    Past Surgical History:  Procedure  Laterality Date  . ABDOMINAL HYSTERECTOMY  1978  . COLONOSCOPY WITH PROPOFOL N/A 02/01/2015   Procedure: COLONOSCOPY WITH PROPOFOL;  Surgeon: Lucilla Lame, MD;  Location: [facility];  Service: Endoscopy;  Laterality: N/A;  Diabetic - oral meds  . COLONOSCOPY WITH PROPOFOL N/A 11/22/2017   Procedure: COLONOSCOPY WITH PROPOFOL;  Surgeon: Lin Landsman, MD;  Location: [facility] ENDOSCOPY;  Service: Gastroenterology;  Laterality: N/A;  . ESOPHAGOGASTRODUODENOSCOPY (EGD) WITH PROPOFOL N/A 07/12/2017   Procedure: ESOPHAGOGASTRODUODENOSCOPY (EGD) WITH PROPOFOL;  Surgeon: Lin Landsman, MD;  Location: [facility] ENDOSCOPY;  Service: Gastroenterology;  Laterality: N/A;  . ESOPHAGOGASTRODUODENOSCOPY (EGD) WITH PROPOFOL N/A 08/16/2017   Procedure: ESOPHAGOGASTRODUODENOSCOPY (EGD) WITH PROPOFOL;  Surgeon: Lin Landsman, MD;  Location: [facility] ENDOSCOPY;  Service: Gastroenterology;  Laterality: N/A;  . ESOPHAGOGASTRODUODENOSCOPY (EGD) WITH PROPOFOL N/A 09/20/2017   Procedure: ESOPHAGOGASTRODUODENOSCOPY (EGD) WITH PROPOFOL;  Surgeon: Lin Landsman, MD;  Location: [facility] END[facility]COPY;  Service: Gastroenterology;  Laterality: N/A;  . ESOPHAGOGASTRODUODENOSCOPY (EGD) WITH PROPOFOL N/A 11/01/2017   Procedure: ESOPHAGOGASTRODUODENOSCOPY (EGD) WITH PROPOFOL;  Surgeon: Lin Landsman, MD;  Location: [facility] ENDOSCOPY;  Service: Gastroenterology;  Laterality: N/A;  . ESOPHAGOGASTRODUODENOSCOPY (EGD) WITH PROPOFOL N/A 01/17/2018   Procedure: ESOPHAGOGASTRODUODENOSCOPY (EGD) WITH PROPOFOL;  Surgeon: Lin Landsman, MD;  Location: [facility] ENDOSCOPY;  Service: Gastroenterology;  Laterality: N/A;  . ESOPHAGOGASTRODUODENOSCOPY (EGD) WITH PROPOFOL N/A 06/20/2018   Procedure: ESOPHAGOGASTRODUODENOSCOPY (EGD) WITH PROPOFOL;  Surgeon: Lin Landsman, MD;  Location: [facility] ENDOSCOPY;  Service: Gastroenterology;  Laterality: N/A;  EGD/ with banding ligation   . IR RADIOLOGIST EVAL & MGMT  07/09/2018  . POLYPECTOMY   02/01/2015   Procedure: POLYPECTOMY;  Surgeon: Lucilla Lame, MD;  Location: [place];  Service: Endoscopy;;  . TONSILLECTOMY AND ADENOIDECTOMY      Her family history includes Alcohol abuse in her father; Cancer in her sister; Dementia in her mother; Lymphoma in her sister and sister.     Outpatient Encounter Medications as of 11/27/2018  Medication Sig  . ergocalciferol (VITAMIN D2) 50000 units capsule Take 50,000 Units by mouth once a week. Every Friday when remembers  . furosemide (LASIX) 20 MG tablet TAKE ONE-HALF TABLET BY MOUTH EVERY DAY (Patient taking differently: 20 mg daily. )  . pantoprazole (PROTONIX) 40 MG tablet TAKE 1 TABLET BY MOUTH TWICE DAILY  . spironolactone (ALDACTONE) 100 MG tablet Take 1 tablet (100 mg total) by mouth daily.  . nadolol (CORGARD) 20 MG tablet Take 1 tablet (20 mg total) by mouth daily. (Patient taking differently: Take 10 mg by mouth daily. )   No facility-administered encounter medications on file as of 11/27/2018.     Patient Care Team: Mar Daring, PA-C as PCP - General (Family Medicine) Bary Castilla, Forest Gleason, MD (General Surgery) Anell Barr, OD as Consulting Physician (Optometry) Cammie Sickle, MD as Consulting Physician (Internal Medicine) Lin Landsman, MD as Consulting Physician (Gastroenterology) Sandi Mariscal, MD as Consulting Physician (Interventional Radiology) Cathi Roan, [facility] (  Pharmacist)      Objective:   Vitals:  Vitals:   11/27/18 1013  BP: 108/60  Pulse: 62  Temp: 97.9 F (36.6 C)  TempSrc: Oral  SpO2: 98%  Weight: 169 lb (76.7 kg)  Height: 5' 1"  (1.549 m)    Physical Exam Vitals signs reviewed.  Constitutional:      General: She is not in acute distress.    Appearance: Normal appearance. She is well-developed. She is not ill-appearing or diaphoretic.  HENT:     Head: Normocephalic and atraumatic.     Right Ear: Tympanic membrane, ear canal and external ear normal.     Left  Ear: Tympanic membrane, ear canal and external ear normal.     Nose: Nose normal.     Mouth/Throat:     Mouth: Mucous membranes are moist.     Pharynx: No oropharyngeal exudate.  Eyes:     General: No scleral icterus.       Right eye: No discharge.        Left eye: No discharge.     Extraocular Movements: Extraocular movements intact.     Conjunctiva/sclera: Conjunctivae normal.     Pupils: Pupils are equal, round, and reactive to light.  Neck:     Musculoskeletal: Normal range of motion and neck supple.     Thyroid: No thyromegaly.     Vascular: No carotid bruit or JVD.     Trachea: No tracheal deviation.  Cardiovascular:     Rate and Rhythm: Normal rate and regular rhythm.     Pulses: Normal pulses.     Heart sounds: Normal heart sounds. No murmur. No friction rub. No gallop.   Pulmonary:     Effort: Pulmonary effort is normal. No respiratory distress.     Breath sounds: Normal breath sounds. No wheezing or rales.  Chest:     Chest wall: No tenderness.  Abdominal:     General: Bowel sounds are normal. There is no distension.     Palpations: Abdomen is soft. There is no mass.     Tenderness: There is no abdominal tenderness. There is no guarding or rebound.  Musculoskeletal: Normal range of motion.        General: No tenderness.  Lymphadenopathy:     Cervical: No cervical adenopathy.  Skin:    General: Skin is warm and dry.     Capillary Refill: Capillary refill takes less than 2 seconds.     Findings: No rash.  Neurological:     General: No focal deficit present.     Mental Status: She is alert and oriented to person, place, and time. Mental status is at baseline.     Cranial Nerves: No cranial nerve deficit.     Motor: No weakness.     Gait: Gait normal.  Psychiatric:        Mood and Affect: Mood normal.        Behavior: Behavior normal.        Thought Content: Thought content normal.        Judgment: Judgment normal.    Diabetic Foot Exam - Simple   Simple Foot  Form Diabetic Foot exam was performed with the following findings: Yes 11/27/2018 10:59 AM  Visual Inspection No deformities, no ulcerations, no other skin breakdown bilaterally: Yes See comments: Yes Sensation Testing Intact to touch and monofilament testing bilaterally: Yes Pulse Check Posterior Tibialis and Dorsalis pulse intact bilaterally: Yes Comments On tip of left great toe there is a  wound that is healing. Patient hit toe on metal door frame on Saturday.       Depression Screen PHQ 2/9 Scores 09/11/2018 09/11/2018 08/23/2017 08/23/2017  PHQ - 2 Score 0 0 0 0  PHQ- 9 Score 1 - 4 -      Assessment & Plan:     Routine Health Maintenance and Physical Exam  Exercise Activities and Dietary recommendations Goals    . Exercise      Recommend walking 3 days a week for at least 30 minutes.     . Exercise 150 minutes per week (moderate activity)    . Exercise 3x per week (30 min per time)     Recommend to start walking 3 days a week for 30 minutes.     . I want to be sure I'm taking the right medicines (pt-stated)     Current Barriers:  Marland Kitchen Knowledge Deficits related to use for each medication  Pharmacist Clinical Goal(s):  Marland Kitchen Over the next 30 days, patient will demonstrate improved understanding of prescribed medications and rationale for usage as evidenced by patient report  Interventions: . Comprehensive medication review performed. . Advised patient to use OTC magnesium supplements 442m nightly for leg cramping  . Discussed plans with patient for ongoing care management follow up and provided patient with direct contact information for care management team . Collaboration with provider re: medication management o Patient needs new diabetes testing supplies (glucometer, testing strips, lancets) sent to [facility] pharmacy  Patient Self Care Activities:  . Self administers medications as prescribed . Calls provider office for new concerns or questions . Try Magnesium  4048mOTC for leg cramps  Initial goal documentation     . Prevent falls     Recommend to remove any items from the home that may cause slips or trips.       Immunization History  Administered Date(s) Administered  . Hepatitis A 07/10/2011, 12/25/2011  . Hepatitis B 07/10/2011, 08/11/2011, 12/25/2011  . Influenza Split 02/24/2010, 03/27/2012  . Influenza, High Dose Seasonal PF 01/18/2015, 02/19/2016, 02/05/2017, 03/04/2018  . Pneumococcal Conjugate-13 07/13/2014  . Pneumococcal Polysaccharide-23 01/27/2009  . Td 04/08/1996  . Tdap 02/24/2010, 06/17/2018    Health Maintenance  Topic Date Due  . HEMOGLOBIN A1C  03/01/2018  . OPHTHALMOLOGY EXAM  03/27/2018  . FOOT EXAM  08/31/2018  . URINE MICROALBUMIN  08/31/2018  . INFLUENZA VACCINE  12/07/2018  . DEXA SCAN  01/27/2020  . COLONOSCOPY  11/22/2020  . TETANUS/TDAP  06/17/2028  . PNA vac Low Risk Adult  Completed     Discussed health benefits of physical activity, and encouraged her to engage in regular exercise appropriate for her age and condition.    1. Annual physical exam Normal physical exam today. Will check labs as below and f/u pending lab results. If labs are stable and WNL she will not need to have these rechecked for one year at her next annual physical exam. She is to call the office in the meantime if she has any acute issue, questions or concerns.  2. Essential hypertension, benign Stable. Continue Nadolol 105mnd spironolactone (mostly takes these for cirrhosis and h/o esophageal varices with bleed). Will check labs as below and f/u pending results. - CBC w/Diff/Platelet - Comprehensive Metabolic Panel (CMET)  3. Liver cirrhosis secondary to NASH ([facility]CC) Stable. Diagnosis pulled for medication refill. Continue current medical treatment plan. Will check labs as below and f/u pending results. Followed by Dr. VanMarius Ditch nadolol (CORGARD)  20 MG tablet; Take 0.5 tablets (10 mg total) by mouth daily.  Dispense: 45  tablet; Refill: 3 - CBC w/Diff/Platelet - Comprehensive Metabolic Panel (CMET) - Lipid Profile - Fe+TIBC+Fer  4. Iron deficiency Secondary to esophageal bleed in 2019. Will check labs as below and f/u pending results. - CBC w/Diff/Platelet - Fe+TIBC+Fer  5. Vitamin D deficiency H/O this and postmenopausal with osteopenia. Will check labs as below and f/u pending results. - CBC w/Diff/Platelet - Vitamin D (25 hydroxy)  6. Type 2 diabetes mellitus with diabetic polyneuropathy, without long-term current use of insulin ([facility]CC) Diet controlled. Will check labs as below and f/u pending results. - CBC w/Diff/Platelet - Comprehensive Metabolic Panel (CMET) - Lipid Profile - HgB A1c  7. History of esophageal varices Stable. Diagnosis pulled for medication refill. Continue current medical treatment plan. Will check labs as below and f/u pending results. Followed by Dr. Marius Ditch, gastroenterology.  - nadolol (CORGARD) 20 MG tablet; Take 0.5 tablets (10 mg total) by mouth daily.  Dispense: 45 tablet; Refill: 3 - CBC w/Diff/Platelet - Fe+TIBC+Fer

## 2018-11-27 NOTE — Patient Instructions (Signed)
Health Maintenance After Age 79 After age 80, you are at a higher risk for certain long-term diseases and infections as well as injuries from falls. Falls are a major cause of broken bones and head injuries in people who are older than age 73. Getting regular preventive care can help to keep you healthy and well. Preventive care includes getting regular testing and making lifestyle changes as recommended by your health care provider. Talk with your health care provider about:  Which screenings and tests you should have. A screening is a test that checks for a disease when you have no symptoms.  A diet and exercise plan that is right for you. What should I know about screenings and tests to prevent falls? Screening and testing are the best ways to find a health problem early. Early diagnosis and treatment give you the best chance of managing medical conditions that are common after age 90. Certain conditions and lifestyle choices may make you more likely to have a fall. Your health care provider may recommend:  Regular vision checks. Poor vision and conditions such as cataracts can make you more likely to have a fall. If you wear glasses, make sure to get your prescription updated if your vision changes.  Medicine review. Work with your health care provider to regularly review all of the medicines you are taking, including over-the-counter medicines. Ask your health care provider about any side effects that may make you more likely to have a fall. Tell your health care provider if any medicines that you take make you feel dizzy or sleepy.  Osteoporosis screening. Osteoporosis is a condition that causes the bones to get weaker. This can make the bones weak and cause them to break more easily.  Blood pressure screening. Blood pressure changes and medicines to control blood pressure can make you feel dizzy.  Strength and balance checks. Your health care provider may recommend certain tests to check your  strength and balance while standing, walking, or changing positions.  Foot health exam. Foot pain and numbness, as well as not wearing proper footwear, can make you more likely to have a fall.  Depression screening. You may be more likely to have a fall if you have a fear of falling, feel emotionally low, or feel unable to do activities that you used to do.  Alcohol use screening. Using too much alcohol can affect your balance and may make you more likely to have a fall. What actions can I take to lower my risk of falls? General instructions  Talk with your health care provider about your risks for falling. Tell your health care provider if: ? You fall. Be sure to tell your health care provider about all falls, even ones that seem minor. ? You feel dizzy, sleepy, or off-balance.  Take over-the-counter and prescription medicines only as told by your health care provider. These include any supplements.  Eat a healthy diet and maintain a healthy weight. A healthy diet includes low-fat dairy products, low-fat (lean) meats, and fiber from whole grains, beans, and lots of fruits and vegetables. Home safety  Remove any tripping hazards, such as rugs, cords, and clutter.  Install safety equipment such as grab bars in bathrooms and safety rails on stairs.  Keep rooms and walkways well-lit. Activity   Follow a regular exercise program to stay fit. This will help you maintain your balance. Ask your health care provider what types of exercise are appropriate for you.  If you need a cane or  walker, use it as recommended by your health care provider.  Wear supportive shoes that have nonskid soles. Lifestyle  Do not drink alcohol if your health care provider tells you not to drink.  If you drink alcohol, limit how much you have: ? 0-1 drink a day for women. ? 0-2 drinks a day for men.  Be aware of how much alcohol is in your drink. In the [place] one drink equals one typical bottle of beer (12  oz), one-half glass of wine (5 oz), or one shot of hard liquor (1 oz).  Do not use any products that contain nicotine or tobacco, such as cigarettes and e-cigarettes. If you need help quitting, ask your health care provider. Summary  Having a healthy lifestyle and getting preventive care can help to protect your health and wellness after age 10.  Screening and testing are the best way to find a health problem early and help you avoid having a fall. Early diagnosis and treatment give you the best chance for managing medical conditions that are more common for people who are older than age 2.  Falls are a major cause of broken bones and head injuries in people who are older than age 14. Take precautions to prevent a fall at home.  Work with your health care provider to learn what changes you can make to improve your health and wellness and to prevent falls. This information is not intended to replace advice given to you by your health care provider. Make sure you discuss any questions you have with your health care provider. Document Released: 03/07/2017 Document Revised: 08/15/2018 Document Reviewed: 03/07/2017 Elsevier Patient Education  2020 [employer].

## 2018-11-28 ENCOUNTER — Telehealth: Payer: Self-pay

## 2018-11-28 LAB — VITAMIN D 25 HYDROXY (VIT D DEFICIENCY, FRACTURES): Vit D, 25-Hydroxy: 41.2 ng/mL (ref 30.0–100.0)

## 2018-11-28 LAB — COMPREHENSIVE METABOLIC PANEL
ALT: 18 IU/L (ref 0–32)
AST: 26 IU/L (ref 0–40)
Albumin/Globulin Ratio: 1.6 (ref 1.2–2.2)
Albumin: 3.8 g/dL (ref 3.7–4.7)
Alkaline Phosphatase: 76 IU/L (ref 39–117)
BUN/Creatinine Ratio: 16 (ref 12–28)
BUN: 13 mg/dL (ref 8–27)
Bilirubin Total: 0.9 mg/dL (ref 0.0–1.2)
CO2: 28 mmol/L (ref 20–29)
Calcium: 9.4 mg/dL (ref 8.7–10.3)
Chloride: 93 mmol/L — ABNORMAL LOW (ref 96–106)
Creatinine, Ser: 0.8 mg/dL (ref 0.57–1.00)
GFR calc Af Amer: 82 mL/min/{1.73_m2} (ref >59)
GFR calc non Af Amer: 71 mL/min/{1.73_m2} (ref >59)
Globulin, Total: 2.4 g/dL (ref 1.5–4.5)
Glucose: 114 mg/dL — ABNORMAL HIGH (ref 65–99)
Potassium: 4.3 mmol/L (ref 3.5–5.2)
Sodium: 133 mmol/L — ABNORMAL LOW (ref 134–144)
Total Protein: 6.2 g/dL (ref 6.0–8.5)

## 2018-11-28 LAB — LIPID PANEL
Chol/HDL Ratio: 3.3 ratio (ref 0.0–4.4)
Cholesterol, Total: 196 mg/dL (ref 100–199)
HDL: 60 mg/dL
LDL Calculated: 123 mg/dL — ABNORMAL HIGH (ref 0–99)
Triglycerides: 66 mg/dL (ref 0–149)
VLDL Cholesterol Cal: 13 mg/dL (ref 5–40)

## 2018-11-28 LAB — CBC WITH DIFFERENTIAL/PLATELET
Basophils Absolute: 0 10*3/uL (ref 0.0–0.2)
Basos: 0 %
EOS (ABSOLUTE): 0 10*3/uL (ref 0.0–0.4)
Eos: 2 %
Hematocrit: 35.2 % (ref 34.0–46.6)
Hemoglobin: 12.1 g/dL (ref 11.1–15.9)
Immature Grans (Abs): 0 10*3/uL (ref 0.0–0.1)
Immature Granulocytes: 0 %
Lymphocytes Absolute: 0.6 10*3/uL — ABNORMAL LOW (ref 0.7–3.1)
Lymphs: 24 %
MCH: 31.8 pg (ref 26.6–33.0)
MCHC: 34.4 g/dL (ref 31.5–35.7)
MCV: 93 fL (ref 79–97)
Monocytes Absolute: 0.2 10*3/uL (ref 0.1–0.9)
Monocytes: 8 %
Neutrophils Absolute: 1.8 10*3/uL (ref 1.4–7.0)
Neutrophils: 66 %
Platelets: 52 10*3/uL — CL (ref 150–450)
RBC: 3.8 x10E6/uL (ref 3.77–5.28)
RDW: 13.7 % (ref 11.7–15.4)
WBC: 2.6 10*3/uL — ABNORMAL LOW (ref 3.4–10.8)

## 2018-11-28 LAB — HEMOGLOBIN A1C
Est. average glucose Bld gHb Est-mCnc: 137 mg/dL
Hgb A1c MFr Bld: 6.4 % — ABNORMAL HIGH (ref 4.8–5.6)

## 2018-11-28 LAB — IRON,TIBC AND FERRITIN PANEL
Ferritin: 49 ng/mL (ref 15–150)
Iron Saturation: 21 % (ref 15–55)
Iron: 73 ug/dL (ref 27–139)
Total Iron Binding Capacity: 353 ug/dL (ref 250–450)
UIBC: 280 ug/dL (ref 118–369)

## 2018-11-28 NOTE — Telephone Encounter (Signed)
-----   Message from Mar Daring, [place] sent at 11/28/2018  1:09 PM EDT ----- Blood count is essentially stable. Kidney function is normal. Liver enzymes are back in normal range. Cholesterol stable. A1c up from 6.0 to 6.4. Limit simple carbohydrates and sugars. Iron is normal. Vit D is normal.

## 2018-11-28 NOTE — Telephone Encounter (Signed)
Pt advised.   Thanks,   -Laura  

## 2018-12-02 ENCOUNTER — Telehealth: Payer: Self-pay | Admitting: Physician Assistant

## 2018-12-02 DIAGNOSIS — N3000 Acute cystitis without hematuria: Secondary | ICD-10-CM

## 2018-12-02 MED ORDER — CEPHALEXIN 500 MG PO CAPS: 500.0000 mg | ORAL_CAPSULE | Freq: Two times a day (BID) | ORAL | 0 refills | Status: DC

## 2018-12-02 NOTE — Telephone Encounter (Signed)
Keflex sent in for uti. If not improving will need to be seen

## 2018-12-02 NOTE — Telephone Encounter (Signed)
LVMTRC 

## 2018-12-02 NOTE — Telephone Encounter (Signed)
Pt called saying she is sure she has a UTI but she can not come in.  She wants to know if Tawanna Sat can call something in to the pharmacy.  She was just in last week for a PE  CB#  [phone]  She uses [facility]   teri

## 2018-12-03 NOTE — Telephone Encounter (Signed)
Patient advised as directed below. 

## 2018-12-09 ENCOUNTER — Telehealth: Payer: Self-pay

## 2018-12-11 ENCOUNTER — Ambulatory Visit (INDEPENDENT_AMBULATORY_CARE_PROVIDER_SITE_OTHER): Payer: Medicare HMO | Admitting: Pharmacist

## 2018-12-11 DIAGNOSIS — E559 Vitamin D deficiency, unspecified: Secondary | ICD-10-CM

## 2018-12-11 DIAGNOSIS — E1142 Type 2 diabetes mellitus with diabetic polyneuropathy: Secondary | ICD-10-CM | POA: Diagnosis not present

## 2018-12-11 DIAGNOSIS — K7581 Nonalcoholic steatohepatitis (NASH): Secondary | ICD-10-CM

## 2018-12-11 DIAGNOSIS — K7460 Unspecified cirrhosis of liver: Secondary | ICD-10-CM

## 2018-12-11 DIAGNOSIS — I10 Essential (primary) hypertension: Secondary | ICD-10-CM | POA: Diagnosis not present

## 2018-12-11 NOTE — Chronic Care Management (AMB) (Signed)
  Chronic Care Management   Follow Up Note   12/11/2018 Name: Jennifer [name] MRN: [id number] DOB: [birth year]  Subjective Jennifer [name] is a 78 y.o. year old female who is a primary care patient of Jennifer [name]. The CCM clinical pharmacist following up today on medication management and optimization. HIPAA identifiers verified.  Assessment Review of patient status, including review of consultants reports, relevant laboratory and other test results, and collaboration with appropriate care team members and the patient's provider was performed as part of comprehensive patient evaluation and provision of chronic care management services.    Goals Addressed            This Visit's Progress   . I want to be sure I'm taking the right medicines (pt-stated)       Current Barriers:  Marland Kitchen Knowledge Deficits related to use for each medication  Pharmacist Clinical Goal(s):  Marland Kitchen Over the next 30 days, patient will demonstrate improved understanding of prescribed medications and rationale for usage as evidenced by patient report  Interventions: . Comprehensive medication review performed. . Advised patient to use OTC magnesium supplements 452m nightly for leg cramping  o Updated 8/5: incorporated more bananas, fruits, vegetables into diet and is no longer experiencing leg cramping at night . Discussed plans with patient for ongoing care management follow up and provided patient with direct contact information for care management team . Collaboration with provider re: medication management o Patient needs new diabetes testing supplies (glucometer, testing strips, lancets) sent to [facility] pharmacy o Patient needs new BP cuff - Suggest patient use OTC [employer] benefit catalog  Patient Self Care Activities:  . Self administers medications as prescribed . Calls provider office for new concerns or questions . Try Magnesium 40105mOTC for leg cramps  Please see past updates related to this  goal by clicking on the "Past Updates" button in the selected goal         Plan -Send patient [employer] OTC catalog  -Counseled patient on OTC benefit -Jennifer [name] to send [facility]  Follow up Telephone follow up appointment with care management team member scheduled for: 2 weeks  JuRuben ReasonPharmD Clinical Pharmacist [phone]

## 2018-12-12 MED ORDER — ACCU-CHEK AVIVA PLUS W/DEVICE KIT: PACK | 0 refills | Status: DC

## 2018-12-12 MED ORDER — ACCU-CHEK AVIVA PLUS VI STRP: ORAL_STRIP | 3 refills | Status: DC

## 2018-12-12 MED ORDER — ACCU-CHEK SOFT TOUCH LANCETS MISC: 12 refills | Status: AC

## 2018-12-12 NOTE — Addendum Note (Signed)
Addended by: Mar Daring on: 12/12/2018 08:45 AM   Modules accepted: Orders

## 2018-12-12 NOTE — Progress Notes (Signed)
Sent in diabetic supplies

## 2018-12-21 DIAGNOSIS — S32511D Fracture of superior rim of right pubis, subsequent encounter for fracture with routine healing: Secondary | ICD-10-CM | POA: Diagnosis not present

## 2018-12-24 ENCOUNTER — Telehealth: Payer: Self-pay | Admitting: Physician Assistant

## 2018-12-24 NOTE — Telephone Encounter (Signed)
Called patient and offered appt. For tomorrow. PAtient reports that she has been having this for four days already and that her leg is better and that she is going to buy Desitin otc and try that first and that she will call back if it starts to get worse.

## 2018-12-24 NOTE — Telephone Encounter (Signed)
Need appt in office if possible to evaluate. Sounds like cellulitis

## 2018-12-24 NOTE — Telephone Encounter (Signed)
Left leg gaulded skin.  Not sure if it's due to recent stress and or sweating during sleep.  Pt needing advise on over the counter to clear it up.  Please advise.  Thanks, [employer]

## 2018-12-24 NOTE — Telephone Encounter (Signed)
Noted  

## 2018-12-24 NOTE — Telephone Encounter (Signed)
Please Review

## 2019-01-14 NOTE — Progress Notes (Signed)
Patient is doing well no major complaints.

## 2019-01-15 ENCOUNTER — Inpatient Hospital Stay (HOSPITAL_BASED_OUTPATIENT_CLINIC_OR_DEPARTMENT_OTHER): Payer: Medicare HMO | Admitting: Internal Medicine

## 2019-01-15 ENCOUNTER — Inpatient Hospital Stay: Payer: Medicare HMO | Attending: Internal Medicine

## 2019-01-15 ENCOUNTER — Other Ambulatory Visit: Payer: Self-pay

## 2019-01-15 ENCOUNTER — Encounter: Payer: Self-pay | Admitting: Internal Medicine

## 2019-01-15 DIAGNOSIS — K7460 Unspecified cirrhosis of liver: Secondary | ICD-10-CM | POA: Insufficient documentation

## 2019-01-15 DIAGNOSIS — D6959 Other secondary thrombocytopenia: Secondary | ICD-10-CM | POA: Insufficient documentation

## 2019-01-15 DIAGNOSIS — D696 Thrombocytopenia, unspecified: Secondary | ICD-10-CM

## 2019-01-15 DIAGNOSIS — Z87891 Personal history of nicotine dependence: Secondary | ICD-10-CM | POA: Insufficient documentation

## 2019-01-15 DIAGNOSIS — D731 Hypersplenism: Secondary | ICD-10-CM | POA: Diagnosis not present

## 2019-01-15 DIAGNOSIS — I10 Essential (primary) hypertension: Secondary | ICD-10-CM | POA: Insufficient documentation

## 2019-01-15 DIAGNOSIS — Z79899 Other long term (current) drug therapy: Secondary | ICD-10-CM | POA: Diagnosis not present

## 2019-01-15 DIAGNOSIS — E871 Hypo-osmolality and hyponatremia: Secondary | ICD-10-CM | POA: Insufficient documentation

## 2019-01-15 DIAGNOSIS — E785 Hyperlipidemia, unspecified: Secondary | ICD-10-CM | POA: Diagnosis not present

## 2019-01-15 DIAGNOSIS — K219 Gastro-esophageal reflux disease without esophagitis: Secondary | ICD-10-CM | POA: Insufficient documentation

## 2019-01-15 DIAGNOSIS — R252 Cramp and spasm: Secondary | ICD-10-CM

## 2019-01-15 DIAGNOSIS — R6881 Early satiety: Secondary | ICD-10-CM | POA: Insufficient documentation

## 2019-01-15 LAB — CBC WITH DIFFERENTIAL/PLATELET
Abs Immature Granulocytes: 0.01 10*3/uL (ref 0.00–0.07)
Basophils Absolute: 0 10*3/uL (ref 0.0–0.1)
Basophils Relative: 1 %
Eosinophils Absolute: 0.1 10*3/uL (ref 0.0–0.5)
Eosinophils Relative: 3 %
HCT: 33 % — ABNORMAL LOW (ref 36.0–46.0)
Hemoglobin: 11.5 g/dL — ABNORMAL LOW (ref 12.0–15.0)
Immature Granulocytes: 0 %
Lymphocytes Relative: 20 %
Lymphs Abs: 0.5 10*3/uL — ABNORMAL LOW (ref 0.7–4.0)
MCH: 31.9 pg (ref 26.0–34.0)
MCHC: 34.8 g/dL (ref 30.0–36.0)
MCV: 91.4 fL (ref 80.0–100.0)
Monocytes Absolute: 0.2 10*3/uL (ref 0.1–1.0)
Monocytes Relative: 6 %
Neutro Abs: 1.9 10*3/uL (ref 1.7–7.7)
Neutrophils Relative %: 70 %
Platelets: 50 10*3/uL — ABNORMAL LOW (ref 150–400)
RBC: 3.61 MIL/uL — ABNORMAL LOW (ref 3.87–5.11)
RDW: 13.2 % (ref 11.5–15.5)
WBC: 2.7 10*3/uL — ABNORMAL LOW (ref 4.0–10.5)
nRBC: 0 % (ref 0.0–0.2)

## 2019-01-15 LAB — COMPREHENSIVE METABOLIC PANEL
ALT: 18 U/L (ref 0–44)
AST: 29 U/L (ref 15–41)
Albumin: 3.8 g/dL (ref 3.5–5.0)
Alkaline Phosphatase: 65 U/L (ref 38–126)
Anion gap: 8 (ref 5–15)
BUN: 15 mg/dL (ref 8–23)
CO2: 30 mmol/L (ref 22–32)
Calcium: 9.6 mg/dL (ref 8.9–10.3)
Chloride: 95 mmol/L — ABNORMAL LOW (ref 98–111)
Creatinine, Ser: 1.02 mg/dL — ABNORMAL HIGH (ref 0.44–1.00)
GFR calc Af Amer: >60 mL/min (ref >60)
GFR calc non Af Amer: 52 mL/min — ABNORMAL LOW (ref >60)
Glucose, Bld: 136 mg/dL — ABNORMAL HIGH (ref 70–99)
Potassium: 4 mmol/L (ref 3.5–5.1)
Sodium: 133 mmol/L — ABNORMAL LOW (ref 135–145)
Total Bilirubin: 1.5 mg/dL — ABNORMAL HIGH (ref 0.3–1.2)
Total Protein: 6.7 g/dL (ref 6.5–8.1)

## 2019-01-15 LAB — APTT: aPTT: 29 seconds (ref 24–36)

## 2019-01-15 LAB — PROTIME-INR
INR: 1.2 (ref 0.8–1.2)
Prothrombin Time: 15.5 seconds — ABNORMAL HIGH (ref 11.4–15.2)

## 2019-01-15 LAB — MAGNESIUM: Magnesium: 1.6 mg/dL — ABNORMAL LOW (ref 1.7–2.4)

## 2019-01-15 NOTE — Assessment & Plan Note (Addendum)
#   Thrombocytopenia secondary to hypersplenism from splenomegaly.  Platelets ~50; trending down over the last 5 years, but overall - stable.  If platelets less than 30/spontaneous bleeding-would consider thrombopoietin stimulating agents.  # mild anemia/ leucopenia- sec to cirrhosis- STABLE.     # Cirrhosis/recent decompensation s/p paracentesis; declined TIPs; awaiting AFP.   # Hyponatremia-sodium 133- Stable; Continue aldactone/nadalol.    # DISPOSITION:  #  follow up 6 months- MD/labs-CBC CMP PT PTT AFP-Dr.B  Cc; Vanga/ Crismon.

## 2019-01-15 NOTE — Progress Notes (Signed)
[place] NOTE  Patient Care Team: Rubye Beach as PCP - General (Family Medicine) Bary Castilla, Forest Gleason, MD (General Surgery) Anell Barr, OD as Consulting Physician (Optometry) Cammie Sickle, MD as Consulting Physician (Internal Medicine) Lin Landsman, MD as Consulting Physician (Gastroenterology) Sandi Mariscal, MD as Consulting Physician (Interventional Radiology) Cathi Roan, [facility] (Pharmacist)  CHIEF COMPLAINTS/PURPOSE OF CONSULTATION:   # THROMBOCYTOPENIA- [2015-100s-80s; 2017] N-Hb/WBC;  Hypersplenism/splenomegaly; no clumping.   #  Cirrhosis/ splenomegaly [US- 2016; stable since 2013]; Hep B/Hep C negative on 12/29/15; DECOMPENSATION of CIRRHOSIS [feb 2019; Dr.Vanga]-declined TIPS  HISTORY OF PRESENTING ILLNESS:  Jennifer [name] 78 y.o.  female Caucasian patient history of thrombocytopenia/compensated cirrhosis/splenomegaly [based on imaging] is here for follow-up.  In the interim patient declined TIPS for her decompensated cirrhosis/varices.   She continues to have easy bruising/but no obvious nosebleeds or gum bleeding.   Complains of worsening fatigue.  Complains of early satiety.   Review of Systems  Constitutional: Positive for malaise/fatigue. Negative for chills, diaphoresis, fever and weight loss.  HENT: Negative for nosebleeds and sore throat.   Eyes: Negative for double vision.  Respiratory: Negative for cough, hemoptysis, sputum production, shortness of breath and wheezing.   Cardiovascular: Negative for chest pain, palpitations, orthopnea and leg swelling.  Gastrointestinal: Negative for abdominal pain, blood in stool, constipation, diarrhea, heartburn, melena, nausea and vomiting.  Genitourinary: Negative for dysuria, frequency and urgency.  Musculoskeletal: Positive for myalgias and neck pain. Negative for back pain and joint pain.  Skin: Negative.  Negative for itching and rash.  Neurological:  Negative for dizziness, tingling, focal weakness, weakness and headaches.  Endo/Heme/Allergies: Bruises/bleeds easily.  Psychiatric/Behavioral: Negative for depression. The patient is not nervous/anxious and does not have insomnia.      MEDICAL HISTORY:  Past Medical History:  Diagnosis Date  . Allergy   . Arthritis   . Asthma   . Colon polyp   . Fatty liver   . GERD (gastroesophageal reflux disease)   . Hyperlipidemia   . Hypertension   . Motion sickness    boats    SURGICAL HISTORY: Past Surgical History:  Procedure Laterality Date  . ABDOMINAL HYSTERECTOMY  1978  . COLONOSCOPY WITH PROPOFOL N/A 02/01/2015   Procedure: COLONOSCOPY WITH PROPOFOL;  Surgeon: Lucilla Lame, MD;  Location: [place];  Service: Endoscopy;  Laterality: N/A;  Diabetic - oral meds  . COLONOSCOPY WITH PROPOFOL N/A 11/22/2017   Procedure: COLONOSCOPY WITH PROPOFOL;  Surgeon: Lin Landsman, MD;  Location: [facility] ENDOSCOPY;  Service: Gastroenterology;  Laterality: N/A;  . ESOPHAGOGASTRODUODENOSCOPY (EGD) WITH PROPOFOL N/A 07/12/2017   Procedure: ESOPHAGOGASTRODUODENOSCOPY (EGD) WITH PROPOFOL;  Surgeon: Lin Landsman, MD;  Location: [facility] ENDOSCOPY;  Service: Gastroenterology;  Laterality: N/A;  . ESOPHAGOGASTRODUODENOSCOPY (EGD) WITH PROPOFOL N/A 08/16/2017   Procedure: ESOPHAGOGASTRODUODENOSCOPY (EGD) WITH PROPOFOL;  Surgeon: Lin Landsman, MD;  Location: [facility] ENDOSCOPY;  Service: Gastroenterology;  Laterality: N/A;  . ESOPHAGOGASTRODUODENOSCOPY (EGD) WITH PROPOFOL N/A 09/20/2017   Procedure: ESOPHAGOGASTRODUODENOSCOPY (EGD) WITH PROPOFOL;  Surgeon: Lin Landsman, MD;  Location: [facility] ENDOSCOPY;  Service: Gastroenterology;  Laterality: N/A;  . ESOPHAGOGASTRODUODENOSCOPY (EGD) WITH PROPOFOL N/A 11/01/2017   Procedure: ESOPHAGOGASTRODUODENOSCOPY (EGD) WITH PROPOFOL;  Surgeon: Lin Landsman, MD;  Location: [facility] ENDOSCOPY;  Service: Gastroenterology;  Laterality: N/A;  .  ESOPHAGOGASTRODUODENOSCOPY (EGD) WITH PROPOFOL N/A 01/17/2018   Procedure: ESOPHAGOGASTRODUODENOSCOPY (EGD) WITH PROPOFOL;  Surgeon: Lin Landsman, MD;  Location: [facility] ENDOSCOPY;  Service: Gastroenterology;  Laterality: N/A;  .  ESOPHAGOGASTRODUODENOSCOPY (EGD) WITH PROPOFOL N/A 06/20/2018   Procedure: ESOPHAGOGASTRODUODENOSCOPY (EGD) WITH PROPOFOL;  Surgeon: Lin Landsman, MD;  Location: [place];  Service: Gastroenterology;  Laterality: N/A;  EGD/ with banding ligation   . IR RADIOLOGIST EVAL & MGMT  07/09/2018  . POLYPECTOMY  02/01/2015   Procedure: POLYPECTOMY;  Surgeon: Lucilla Lame, MD;  Location: [place];  Service: Endoscopy;;  . TONSILLECTOMY AND ADENOIDECTOMY      SOCIAL HISTORY: Social History   Socioeconomic History  . Marital status: Widowed    Spouse name: Not on file  . Number of children: 2  . Years of education: Not on file  . Highest education level: Not on file  Occupational History  . Occupation: retired  Scientific laboratory technician  . Financial resource strain: Somewhat hard  . Food insecurity    Worry: Never true    Inability: Never true  . Transportation needs    Medical: No    Non-medical: No  Tobacco Use  . Smoking status: Former Smoker    Packs/day: 0.50    Years: 6.00    Pack years: 3.00    Types: Cigarettes    Quit date: 05/08/1987    Years since quitting: 31.7  . Smokeless tobacco: Never Used  Substance and Sexual Activity  . Alcohol use: Never    Frequency: Never  . Drug use: Never  . Sexual activity: Not on file  Lifestyle  . Physical activity    Days per week: 0 days    Minutes per session: 0 min  . Stress: Not at all  Relationships  . Social Herbalist on phone: Patient refused    Gets together: Patient refused    Attends religious service: Patient refused    Active member of club or organization: Patient refused    Attends meetings of clubs or organizations: Patient refused    Relationship status: Patient  refused  . Intimate partner violence    Fear of current or ex partner: Patient refused    Emotionally abused: Patient refused    Physically abused: Patient refused    Forced sexual activity: Patient refused  Other Topics Concern  . Not on file  Social History Narrative  . Not on file    FAMILY HISTORY: Family History  Problem Relation Age of Onset  . Dementia Mother   . Alcohol abuse Father   . Cancer Sister        lung  . Lymphoma Sister   . Lymphoma Sister     ALLERGIES:  is allergic to amoxicillin; codeine; hydrocodone-acetaminophen; nitrofurantoin; nitrofurantoin monohyd macro; penicillins; and sulfa antibiotics.  MEDICATIONS:  Current Outpatient Medications  Medication Sig Dispense Refill  . ACCU-CHEK AVIVA PLUS test strip To check blood sugar daily 100 each 3  . Blood Glucose Monitoring Suppl (ACCU-CHEK AVIVA PLUS) w/Device KIT To check blood sugar daily 1 kit 0  . ergocalciferol (VITAMIN D2) 50000 units capsule Take 50,000 Units by mouth once a week. Every Friday when remembers    . furosemide (LASIX) 20 MG tablet TAKE ONE-HALF TABLET BY MOUTH EVERY DAY (Patient taking differently: 20 mg daily. ) 45 tablet 1  . Lancets (ACCU-CHEK SOFT TOUCH) lancets To check blood sugar daily 100 each 12  . nadolol (CORGARD) 20 MG tablet Take 0.5 tablets (10 mg total) by mouth daily. 45 tablet 3  . pantoprazole (PROTONIX) 40 MG tablet TAKE 1 TABLET BY MOUTH TWICE DAILY 180 tablet 1  . spironolactone (ALDACTONE) 100 MG tablet Take  1 tablet (100 mg total) by mouth daily. 90 tablet 3   No current facility-administered medications for this visit.       Marland Kitchen  PHYSICAL EXAMINATION:   Vitals:   01/15/19 1001  BP: 130/66  Pulse: 60  Temp: 98.3 F (36.8 C)   Filed Weights   01/15/19 1001  Weight: 171 lb (77.6 kg)    Physical Exam  Constitutional: She is oriented to person, place, and time and well-developed, well-nourished, and in no distress.  HENT:  Head: Normocephalic and  atraumatic.  Mouth/Throat: Oropharynx is clear and moist. No oropharyngeal exudate.  Eyes: Pupils are equal, round, and reactive to light.  Neck: Normal range of motion. Neck supple.  Cardiovascular: Normal rate and regular rhythm.  Pulmonary/Chest: Effort normal and breath sounds normal. No respiratory distress. She has no wheezes.  Abdominal: Soft. Bowel sounds are normal. She exhibits no distension and no mass. There is no abdominal tenderness. There is no rebound and no guarding.  Positive for splenomegaly.  No ascites noted   Musculoskeletal: Normal range of motion.        General: No tenderness or edema.  Neurological: She is alert and oriented to person, place, and time.  Skin: Skin is warm.  Psychiatric: Affect normal.     LABORATORY DATA:  I have reviewed the data as listed Lab Results  Component Value Date   WBC 2.7 (L) 01/15/2019   HGB 11.5 (L) 01/15/2019   HCT 33.0 (L) 01/15/2019   MCV 91.4 01/15/2019   PLT 50 (L) 01/15/2019   Recent Labs    07/17/18 1001 07/24/18 0749 11/27/18 1055 01/15/19 0936  NA 133*  --  133* 133*  K 4.3  --  4.3 4.0  CL 98  --  93* 95*  CO2 28  --  28 30  GLUCOSE 137*  --  114* 136*  BUN 16  --  13 15  CREATININE 0.87 1.00 0.80 1.02*  CALCIUM 9.0  --  9.4 9.6  GFRNONAA >60  --  71 52*  GFRAA >60  --  82 >60  PROT 7.0  --  6.2 6.7  ALBUMIN 3.8  --  3.8 3.8  AST 134*  --  26 29  ALT 80*  --  18 18  ALKPHOS 73  --  76 65  BILITOT 1.6*  --  0.9 1.5*    RADIOGRAPHIC STUDIES: I have personally reviewed the radiological images as listed and agreed with the findings in the report. No results found.  ASSESSMENT & PLAN:  Thrombocytopenia ([place]) # Thrombocytopenia secondary to hypersplenism from splenomegaly.  Platelets ~50; trending down over the last 5 years, but overall - stable.  If platelets less than 30/spontaneous bleeding-would consider thrombopoietin stimulating agents.  # mild anemia/ leucopenia- sec to cirrhosis- STABLE.      # Cirrhosis/recent decompensation s/p paracentesis; declined TIPs; awaiting AFP.   # Hyponatremia-sodium 133- Stable; Continue aldactone/nadalol.    # DISPOSITION:  #  follow up 6 months- MD/labs-CBC CMP PT PTT AFP-Dr.B  Cc; Vanga/ Crismon.     Cammie Sickle, MD 01/15/2019 1:31 PM

## 2019-01-16 LAB — AFP TUMOR MARKER: AFP, Serum, Tumor Marker: 3.2 ng/mL (ref 0.0–8.3)

## 2019-01-21 DIAGNOSIS — S32511D Fracture of superior rim of right pubis, subsequent encounter for fracture with routine healing: Secondary | ICD-10-CM | POA: Diagnosis not present

## 2019-02-20 DIAGNOSIS — S32511D Fracture of superior rim of right pubis, subsequent encounter for fracture with routine healing: Secondary | ICD-10-CM | POA: Diagnosis not present

## 2019-03-04 ENCOUNTER — Other Ambulatory Visit: Payer: Self-pay | Admitting: Physician Assistant

## 2019-03-04 DIAGNOSIS — M7989 Other specified soft tissue disorders: Secondary | ICD-10-CM

## 2019-03-04 MED ORDER — FUROSEMIDE 20 MG PO TABS: 20.0000 mg | ORAL_TABLET | Freq: Every day | ORAL | 1 refills | Status: DC

## 2019-03-04 NOTE — Telephone Encounter (Signed)
Please advise 

## 2019-03-04 NOTE — Telephone Encounter (Signed)
Pt needing a refill on her  furosemide (LASIX) 20 MG tablet. Pt has not been cutting the tablet in half.  She says they are so small and they help her with the fluid. Pt only has 4 or 5 days worth left.  Please call into:  [place], [place] - [place] [phone] (Phone) [phone] (Fax)   Thanks, [employer]

## 2019-03-23 DIAGNOSIS — S32511D Fracture of superior rim of right pubis, subsequent encounter for fracture with routine healing: Secondary | ICD-10-CM | POA: Diagnosis not present

## 2019-04-22 DIAGNOSIS — S32511D Fracture of superior rim of right pubis, subsequent encounter for fracture with routine healing: Secondary | ICD-10-CM | POA: Diagnosis not present

## 2019-05-16 ENCOUNTER — Other Ambulatory Visit: Payer: Self-pay | Admitting: Physician Assistant

## 2019-05-16 DIAGNOSIS — K219 Gastro-esophageal reflux disease without esophagitis: Secondary | ICD-10-CM

## 2019-05-23 DIAGNOSIS — S32511D Fracture of superior rim of right pubis, subsequent encounter for fracture with routine healing: Secondary | ICD-10-CM | POA: Diagnosis not present

## 2019-06-13 ENCOUNTER — Other Ambulatory Visit: Payer: Self-pay | Admitting: Physician Assistant

## 2019-06-13 NOTE — Telephone Encounter (Signed)
Requested medication (s) are due for refill today: yes  Requested medication (s) are on the active medication list: yes  Last refill:  03/17/2019  Future visit scheduled: no  Notes to clinic:  no valid encounter within last 6 months    Requested Prescriptions  Pending Prescriptions Disp Refills   spironolactone (ALDACTONE) 100 MG tablet [Pharmacy Med Name: SPIRONOLACTONE 100 MG TAB] 90 tablet 3    Sig: TAKE ONE TABLET BY MOUTH EVERY DAY      Cardiovascular: Diuretics - Aldosterone Antagonist Failed - 06/13/2019  9:58 AM      Failed - Cr in normal range and within 360 days    Creat  Date Value Ref Range Status  07/09/2018 0.98 (H) 0.60 - 0.93 mg/dL Final    Comment:    For patients >33 years of age, the reference limit for Creatinine is approximately 13% higher for people identified as African-American. .    Creatinine, Ser  Date Value Ref Range Status  01/15/2019 1.02 (H) 0.44 - 1.00 mg/dL Final   Creatinine, POC  Date Value Ref Range Status  08/30/2017 NA mg/dL Final          Failed - Na in normal range and within 360 days    Sodium  Date Value Ref Range Status  01/15/2019 133 (L) 135 - 145 mmol/L Final  11/27/2018 133 (L) 134 - 144 mmol/L Final          Failed - Valid encounter within last 6 months    Recent Outpatient Visits           6 months ago Annual physical exam   [facility] [name] M, PA-C   6 months ago Primary osteoarthritis of left knee   [facility] [name] M, [place]   11 months ago Dog bite, subsequent encounter   [facility], [place], [place]   1 year ago URI with cough and congestion   [place], [place]   1 year ago Closed fracture of right pubis, unspecified portion of pubis, sequela   [facility], [place] Muff, [place]       Future Appointments             In 3 months [employer], [place] in normal range and within 360 days    Potassium  Date Value Ref Range Status  01/15/2019 4.0 3.5 - 5.1 mmol/L Final          Passed - Last BP in normal range    BP Readings from Last 1 Encounters:  01/15/19 130/66

## 2019-06-23 DIAGNOSIS — S32511D Fracture of superior rim of right pubis, subsequent encounter for fracture with routine healing: Secondary | ICD-10-CM | POA: Diagnosis not present

## 2019-07-16 ENCOUNTER — Other Ambulatory Visit: Payer: Self-pay

## 2019-07-16 ENCOUNTER — Inpatient Hospital Stay (HOSPITAL_BASED_OUTPATIENT_CLINIC_OR_DEPARTMENT_OTHER): Payer: Medicare HMO | Admitting: Internal Medicine

## 2019-07-16 ENCOUNTER — Encounter: Payer: Self-pay | Admitting: Internal Medicine

## 2019-07-16 ENCOUNTER — Inpatient Hospital Stay: Payer: Medicare HMO | Attending: Internal Medicine

## 2019-07-16 VITALS — BP 128/75 | HR 67 | Temp 96.5°F | Wt 174.8 lb

## 2019-07-16 DIAGNOSIS — D649 Anemia, unspecified: Secondary | ICD-10-CM | POA: Insufficient documentation

## 2019-07-16 DIAGNOSIS — D6959 Other secondary thrombocytopenia: Secondary | ICD-10-CM | POA: Diagnosis not present

## 2019-07-16 DIAGNOSIS — K7460 Unspecified cirrhosis of liver: Secondary | ICD-10-CM

## 2019-07-16 DIAGNOSIS — Z801 Family history of malignant neoplasm of trachea, bronchus and lung: Secondary | ICD-10-CM | POA: Diagnosis not present

## 2019-07-16 DIAGNOSIS — Z807 Family history of other malignant neoplasms of lymphoid, hematopoietic and related tissues: Secondary | ICD-10-CM | POA: Insufficient documentation

## 2019-07-16 DIAGNOSIS — E871 Hypo-osmolality and hyponatremia: Secondary | ICD-10-CM | POA: Insufficient documentation

## 2019-07-16 DIAGNOSIS — D696 Thrombocytopenia, unspecified: Secondary | ICD-10-CM

## 2019-07-16 DIAGNOSIS — K7581 Nonalcoholic steatohepatitis (NASH): Secondary | ICD-10-CM

## 2019-07-16 DIAGNOSIS — Z87891 Personal history of nicotine dependence: Secondary | ICD-10-CM | POA: Diagnosis not present

## 2019-07-16 DIAGNOSIS — D731 Hypersplenism: Secondary | ICD-10-CM | POA: Diagnosis not present

## 2019-07-16 LAB — CBC WITH DIFFERENTIAL/PLATELET
Abs Immature Granulocytes: 0.01 10*3/uL (ref 0.00–0.07)
Basophils Absolute: 0 10*3/uL (ref 0.0–0.1)
Basophils Relative: 1 %
Eosinophils Absolute: 0.1 10*3/uL (ref 0.0–0.5)
Eosinophils Relative: 3 %
HCT: 34.8 % — ABNORMAL LOW (ref 36.0–46.0)
Hemoglobin: 11.7 g/dL — ABNORMAL LOW (ref 12.0–15.0)
Immature Granulocytes: 0 %
Lymphocytes Relative: 20 %
Lymphs Abs: 0.5 10*3/uL — ABNORMAL LOW (ref 0.7–4.0)
MCH: 31 pg (ref 26.0–34.0)
MCHC: 33.6 g/dL (ref 30.0–36.0)
MCV: 92.3 fL (ref 80.0–100.0)
Monocytes Absolute: 0.1 10*3/uL (ref 0.1–1.0)
Monocytes Relative: 6 %
Neutro Abs: 1.6 10*3/uL — ABNORMAL LOW (ref 1.7–7.7)
Neutrophils Relative %: 70 %
Platelets: 52 10*3/uL — ABNORMAL LOW (ref 150–400)
RBC: 3.77 MIL/uL — ABNORMAL LOW (ref 3.87–5.11)
RDW: 13 % (ref 11.5–15.5)
WBC: 2.4 10*3/uL — ABNORMAL LOW (ref 4.0–10.5)
nRBC: 0 % (ref 0.0–0.2)

## 2019-07-16 LAB — COMPREHENSIVE METABOLIC PANEL
ALT: 17 U/L (ref 0–44)
AST: 30 U/L (ref 15–41)
Albumin: 3.9 g/dL (ref 3.5–5.0)
Alkaline Phosphatase: 58 U/L (ref 38–126)
Anion gap: 10 (ref 5–15)
BUN: 16 mg/dL (ref 8–23)
CO2: 28 mmol/L (ref 22–32)
Calcium: 9.3 mg/dL (ref 8.9–10.3)
Chloride: 95 mmol/L — ABNORMAL LOW (ref 98–111)
Creatinine, Ser: 0.83 mg/dL (ref 0.44–1.00)
GFR calc Af Amer: >60 mL/min (ref >60)
GFR calc non Af Amer: >60 mL/min (ref >60)
Glucose, Bld: 150 mg/dL — ABNORMAL HIGH (ref 70–99)
Potassium: 4 mmol/L (ref 3.5–5.1)
Sodium: 133 mmol/L — ABNORMAL LOW (ref 135–145)
Total Bilirubin: 1.5 mg/dL — ABNORMAL HIGH (ref 0.3–1.2)
Total Protein: 6.9 g/dL (ref 6.5–8.1)

## 2019-07-16 LAB — APTT: aPTT: 28 seconds (ref 24–36)

## 2019-07-16 LAB — PROTIME-INR
INR: 1.2 (ref 0.8–1.2)
Prothrombin Time: 14.7 seconds (ref 11.4–15.2)

## 2019-07-16 NOTE — Progress Notes (Signed)
[place] NOTE  Patient Care Team: Rubye Beach as PCP - General (Family Medicine) Bary Castilla, Forest Gleason, MD (General Surgery) Anell Barr, OD as Consulting Physician (Optometry) Cammie Sickle, MD as Consulting Physician (Internal Medicine) Lin Landsman, MD as Consulting Physician (Gastroenterology) Sandi Mariscal, MD as Consulting Physician (Interventional Radiology) Cathi Roan, [facility] (Pharmacist)  CHIEF COMPLAINTS/PURPOSE OF CONSULTATION:   # THROMBOCYTOPENIA- [2015-100s-80s; 2017] N-Hb/WBC;  Hypersplenism/splenomegaly; no clumping.   #  Cirrhosis/ splenomegaly [US- 2016; stable since 2013]; Hep B/Hep C negative on 12/29/15; DECOMPENSATION of CIRRHOSIS [feb 2019; Dr.Vanga]-declined TIPS  HISTORY OF PRESENTING ILLNESS:  Jennifer [name] 79 y.o.  female Caucasian patient history of thrombocytopenia/compensated cirrhosis/splenomegaly [based on imaging] is here for follow-up.  Patient denies any worsening swelling in the legs.  Denies any recent hospitalizations.  Continues to admit to easy bruising.  Otherwise no nosebleeds, bleeding.  Chronic fatigue.  Chronic joint pains.  Review of Systems  Constitutional: Positive for malaise/fatigue. Negative for chills, diaphoresis, fever and weight loss.  HENT: Negative for nosebleeds and sore throat.   Eyes: Negative for double vision.  Respiratory: Negative for cough, hemoptysis, sputum production, shortness of breath and wheezing.   Cardiovascular: Negative for chest pain, palpitations, orthopnea and leg swelling.  Gastrointestinal: Negative for abdominal pain, blood in stool, constipation, diarrhea, heartburn, melena, nausea and vomiting.  Genitourinary: Negative for dysuria, frequency and urgency.  Musculoskeletal: Positive for myalgias and neck pain. Negative for back pain and joint pain.  Skin: Negative.  Negative for itching and rash.  Neurological: Negative for dizziness,  tingling, focal weakness, weakness and headaches.  Endo/Heme/Allergies: Bruises/bleeds easily.  Psychiatric/Behavioral: Negative for depression. The patient is not nervous/anxious and does not have insomnia.      MEDICAL HISTORY:  Past Medical History:  Diagnosis Date  . Allergy   . Arthritis   . Asthma   . Colon polyp   . Fatty liver   . GERD (gastroesophageal reflux disease)   . Hyperlipidemia   . Hypertension   . Motion sickness    boats    SURGICAL HISTORY: Past Surgical History:  Procedure Laterality Date  . ABDOMINAL HYSTERECTOMY  1978  . COLONOSCOPY WITH PROPOFOL N/A 02/01/2015   Procedure: COLONOSCOPY WITH PROPOFOL;  Surgeon: Lucilla Lame, MD;  Location: [place];  Service: Endoscopy;  Laterality: N/A;  Diabetic - oral meds  . COLONOSCOPY WITH PROPOFOL N/A 11/22/2017   Procedure: COLONOSCOPY WITH PROPOFOL;  Surgeon: Lin Landsman, MD;  Location: [facility] ENDOSCOPY;  Service: Gastroenterology;  Laterality: N/A;  . ESOPHAGOGASTRODUODENOSCOPY (EGD) WITH PROPOFOL N/A 07/12/2017   Procedure: ESOPHAGOGASTRODUODENOSCOPY (EGD) WITH PROPOFOL;  Surgeon: Lin Landsman, MD;  Location: [facility] ENDOSCOPY;  Service: Gastroenterology;  Laterality: N/A;  . ESOPHAGOGASTRODUODENOSCOPY (EGD) WITH PROPOFOL N/A 08/16/2017   Procedure: ESOPHAGOGASTRODUODENOSCOPY (EGD) WITH PROPOFOL;  Surgeon: Lin Landsman, MD;  Location: [facility] ENDOSCOPY;  Service: Gastroenterology;  Laterality: N/A;  . ESOPHAGOGASTRODUODENOSCOPY (EGD) WITH PROPOFOL N/A 09/20/2017   Procedure: ESOPHAGOGASTRODUODENOSCOPY (EGD) WITH PROPOFOL;  Surgeon: Lin Landsman, MD;  Location: [facility] ENDOSCOPY;  Service: Gastroenterology;  Laterality: N/A;  . ESOPHAGOGASTRODUODENOSCOPY (EGD) WITH PROPOFOL N/A 11/01/2017   Procedure: ESOPHAGOGASTRODUODENOSCOPY (EGD) WITH PROPOFOL;  Surgeon: Lin Landsman, MD;  Location: [facility] ENDOSCOPY;  Service: Gastroenterology;  Laterality: N/A;  . ESOPHAGOGASTRODUODENOSCOPY (EGD)  WITH PROPOFOL N/A 01/17/2018   Procedure: ESOPHAGOGASTRODUODENOSCOPY (EGD) WITH PROPOFOL;  Surgeon: Lin Landsman, MD;  Location: [facility] ENDOSCOPY;  Service: Gastroenterology;  Laterality: N/A;  . ESOPHAGOGASTRODUODENOSCOPY (EGD)  WITH PROPOFOL N/A 06/20/2018   Procedure: ESOPHAGOGASTRODUODENOSCOPY (EGD) WITH PROPOFOL;  Surgeon: Lin Landsman, MD;  Location: [facility] ENDOSCOPY;  Service: Gastroenterology;  Laterality: N/A;  EGD/ with banding ligation   . IR RADIOLOGIST EVAL & MGMT  07/09/2018  . POLYPECTOMY  02/01/2015   Procedure: POLYPECTOMY;  Surgeon: Lucilla Lame, MD;  Location: [place];  Service: Endoscopy;;  . TONSILLECTOMY AND ADENOIDECTOMY      SOCIAL HISTORY: Social History   Socioeconomic History  . Marital status: Widowed    Spouse name: Not on file  . Number of children: 2  . Years of education: Not on file  . Highest education level: Not on file  Occupational History  . Occupation: retired  Tobacco Use  . Smoking status: Former Smoker    Packs/day: 0.50    Years: 6.00    Pack years: 3.00    Types: Cigarettes    Quit date: 05/08/1987    Years since quitting: 32.2  . Smokeless tobacco: Never Used  Substance and Sexual Activity  . Alcohol use: Never  . Drug use: Never  . Sexual activity: Not on file  Other Topics Concern  . Not on file  Social History Narrative  . Not on file   Social Determinants of Health   Financial Resource Strain: Medium Risk  . Difficulty of Paying Living Expenses: Somewhat hard  Food Insecurity:   . Worried About Charity fundraiser in the Last Year:   . Arboriculturist in the Last Year:   Transportation Needs:   . Film/video editor (Medical):   Marland Kitchen Lack of Transportation (Non-Medical):   Physical Activity: Inactive  . Days of Exercise per Week: 0 days  . Minutes of Exercise per Session: 0 min  Stress:   . Feeling of Stress :   Social Connections: Unknown  . Frequency of Communication with Friends and Family:  Patient refused  . Frequency of Social Gatherings with Friends and Family: Patient refused  . Attends Religious Services: Patient refused  . Active Member of Clubs or Organizations: Patient refused  . Attends Archivist Meetings: Patient refused  . Marital Status: Patient refused  Intimate Partner Violence: Unknown  . Fear of Current or Ex-Partner: Patient refused  . Emotionally Abused: Patient refused  . Physically Abused: Patient refused  . Sexually Abused: Patient refused    FAMILY HISTORY: Family History  Problem Relation Age of Onset  . Dementia Mother   . Alcohol abuse Father   . Cancer Sister        lung  . Lymphoma Sister   . Lymphoma Sister     ALLERGIES:  is allergic to amoxicillin; codeine; hydrocodone-acetaminophen; nitrofurantoin; nitrofurantoin monohyd macro; penicillins; and sulfa antibiotics.  MEDICATIONS:  Current Outpatient Medications  Medication Sig Dispense Refill  . ACCU-CHEK AVIVA PLUS test strip To check blood sugar daily 100 each 3  . Blood Glucose Monitoring Suppl (ACCU-CHEK AVIVA PLUS) w/Device KIT To check blood sugar daily 1 kit 0  . ergocalciferol (VITAMIN D2) 50000 units capsule Take 50,000 Units by mouth once a week. Every Friday when remembers    . furosemide (LASIX) 20 MG tablet Take 1 tablet (20 mg total) by mouth daily. 90 tablet 1  . Lancets (ACCU-CHEK SOFT TOUCH) lancets To check blood sugar daily 100 each 12  . nadolol (CORGARD) 20 MG tablet Take 0.5 tablets (10 mg total) by mouth daily. 45 tablet 3  . pantoprazole (PROTONIX) 40 MG tablet TAKE ONE TABLET  BY MOUTH TWICE DAILY 180 tablet 1  . spironolactone (ALDACTONE) 100 MG tablet TAKE ONE TABLET BY MOUTH EVERY DAY 90 tablet 3  . FLUZONE HIGH-DOSE QUADRIVALENT 0.7 ML SUSY      No current facility-administered medications for this visit.      Marland Kitchen  PHYSICAL EXAMINATION:   Vitals:   07/16/19 0951  BP: 128/75  Pulse: 67  Temp: (!) 96.5 F (35.8 C)   Filed Weights    07/16/19 0951  Weight: 174 lb 12.8 oz (79.3 kg)    Physical Exam  Constitutional: She is oriented to person, place, and time and well-developed, well-nourished, and in no distress.  HENT:  Head: Normocephalic and atraumatic.  Mouth/Throat: Oropharynx is clear and moist. No oropharyngeal exudate.  Eyes: Pupils are equal, round, and reactive to light.  Cardiovascular: Normal rate and regular rhythm.  Pulmonary/Chest: Effort normal and breath sounds normal. No respiratory distress. She has no wheezes.  Abdominal: Soft. Bowel sounds are normal. She exhibits no distension and no mass. There is no abdominal tenderness. There is no rebound and no guarding.  Positive for splenomegaly.  No ascites noted   Musculoskeletal:        General: No tenderness or edema. Normal range of motion.     Cervical back: Normal range of motion and neck supple.  Neurological: She is alert and oriented to person, place, and time.  Skin: Skin is warm.  Psychiatric: Affect normal.     LABORATORY DATA:  I have reviewed the data as listed Lab Results  Component Value Date   WBC 2.4 (L) 07/16/2019   HGB 11.7 (L) 07/16/2019   HCT 34.8 (L) 07/16/2019   MCV 92.3 07/16/2019   PLT 52 (L) 07/16/2019   Recent Labs    11/27/18 1055 01/15/19 0936 07/16/19 0933  NA 133* 133* 133*  K 4.3 4.0 4.0  CL 93* 95* 95*  CO2 _0 GLUCOSE 114* 136* 150*  BUN _1 CREATININE 0.80 1.02* 0.83  CALCIUM 9.4 9.6 9.3  GFRNONAA 71 52* >60  GFRAA 82 >60 >60  PROT 6.2 6.7 6.9  ALBUMIN 3.8 3.8 3.9  AST _2 ALT _3 ALKPHOS 76 65 58  BILITOT 0.9 1.5* 1.5*    RADIOGRAPHIC STUDIES: I have personally reviewed the radiological images as listed and agreed with the findings in the report. No results found.  ASSESSMENT & PLAN:  Thrombocytopenia ([place]) # Thrombocytopenia secondary to hypersplenism from splenomegaly.  Platelets-52 ; trending down over the last 5 years, but overall - STABLE.  If platelets less  than 30/spontaneous bleeding-would consider thrombopoietin stimulating agents.  # mild anemia/ leucopenia- sec to cirrhosis-no infections; asymtomatic STABLE;      # Cirrhosis/recent decompensation s/p paracentesis; declined TIPs; awaiting AFP.  Recommend US abdomen for [place] surveillance.   # Hyponatremia-sodium 133- STABLE;  Continue aldactone/nadalol.    # DISPOSITION: will call with results  # US abdomen in 2 weeks-  #  follow up 6 months- MD/labs-CBC CMP PT PTT AFP-Dr.B     Cammie Sickle, MD 07/18/2019 7:53 AM

## 2019-07-16 NOTE — Assessment & Plan Note (Addendum)
#   Thrombocytopenia secondary to hypersplenism from splenomegaly.  Platelets-52 ; trending down over the last 5 years, but overall - STABLE.  If platelets less than 30/spontaneous bleeding-would consider thrombopoietin stimulating agents.  # mild anemia/ leucopenia- sec to cirrhosis-no infections; asymtomatic STABLE;      # Cirrhosis/recent decompensation s/p paracentesis; declined TIPs; awaiting AFP.  Recommend US abdomen for [place] surveillance.   # Hyponatremia-sodium 133- STABLE;  Continue aldactone/nadalol.    # DISPOSITION: will call with results  # US abdomen in 2 weeks-  #  follow up 6 months- MD/labs-CBC CMP PT PTT AFP-Dr.B

## 2019-07-17 LAB — AFP TUMOR MARKER: AFP, Serum, Tumor Marker: 4.6 ng/mL (ref 0.0–8.3)

## 2019-07-21 DIAGNOSIS — S32511D Fracture of superior rim of right pubis, subsequent encounter for fracture with routine healing: Secondary | ICD-10-CM | POA: Diagnosis not present

## 2019-07-30 ENCOUNTER — Other Ambulatory Visit: Payer: Self-pay

## 2019-07-30 ENCOUNTER — Ambulatory Visit
Admission: RE | Admit: 2019-07-30 | Discharge: 2019-07-30 | Disposition: A | Discharge status: Home / Self Care | Payer: Medicare HMO | Source: Ambulatory Visit | Attending: Internal Medicine | Admitting: Internal Medicine

## 2019-07-30 DIAGNOSIS — K7689 Other specified diseases of liver: Secondary | ICD-10-CM | POA: Diagnosis not present

## 2019-07-30 DIAGNOSIS — K7460 Unspecified cirrhosis of liver: Secondary | ICD-10-CM | POA: Insufficient documentation

## 2019-07-30 DIAGNOSIS — K7581 Nonalcoholic steatohepatitis (NASH): Secondary | ICD-10-CM | POA: Insufficient documentation

## 2019-07-30 DIAGNOSIS — K8020 Calculus of gallbladder without cholecystitis without obstruction: Secondary | ICD-10-CM | POA: Diagnosis not present

## 2019-08-01 ENCOUNTER — Telehealth: Payer: Self-pay

## 2019-08-01 NOTE — Telephone Encounter (Signed)
Dr. Rogue Bussing had ordered this to monitor the cirrhosis.  Cirrhosis appears stable. No distinct mass indicating cancer was noted. There is a 1.3cm gallstone. No signs of infection of the gallbladder.

## 2019-08-01 NOTE — Telephone Encounter (Signed)
Patient advised as directed below. 

## 2019-08-01 NOTE — Telephone Encounter (Signed)
Copied from [place] [id number]. Topic: General - Other >> Aug 01, 2019 10:32 AM Celene Kras wrote: Reason for CRM: Pt called and is requesting to have her ultrasound results from her liver. Please advise.

## 2019-08-01 NOTE — Telephone Encounter (Signed)
Please advise. It looks like the results are in.

## 2019-08-04 ENCOUNTER — Telehealth: Payer: Self-pay | Admitting: Internal Medicine

## 2019-08-04 NOTE — Telephone Encounter (Signed)
Patient called by RN. Discussed test results from u/s. Patient thanked me for calling and updating her.

## 2019-08-04 NOTE — Telephone Encounter (Signed)
H/T-please inform patient that her ultrasound of the abdomen-showed cirrhosis; otherwise no concerning findings.  Continue follow-up as planned.

## 2019-08-21 DIAGNOSIS — S32511D Fracture of superior rim of right pubis, subsequent encounter for fracture with routine healing: Secondary | ICD-10-CM | POA: Diagnosis not present

## 2019-08-26 ENCOUNTER — Other Ambulatory Visit: Payer: Self-pay | Admitting: Physician Assistant

## 2019-08-26 DIAGNOSIS — M7989 Other specified soft tissue disorders: Secondary | ICD-10-CM

## 2019-08-26 NOTE — Telephone Encounter (Signed)
Called pt to set up F/u visit. Pt stated she would like to come in on the same day she meets with the Nurse. (09/17/19). Informed pt that there is not an opening that day. Pt requesting to see if she can be given an appt. Informed pt that will have to let office know.  Med last refilled: 03/04/19 Active med list: yes Requested Prescriptions  Pending Prescriptions Disp Refills  . furosemide (LASIX) 20 MG tablet [Pharmacy Med Name: FUROSEMIDE 20 MG TAB] 90 tablet 1    Sig: TAKE ONE TABLET EVERY DAY     Cardiovascular:  Diuretics - Loop Failed - 08/26/2019  1:55 PM      Failed - Na in normal range and within 360 days    Sodium  Date Value Ref Range Status  07/16/2019 133 (L) 135 - 145 mmol/L Final  11/27/2018 133 (L) 134 - 144 mmol/L Final         Failed - Valid encounter within last 6 months    Recent Outpatient Visits          9 months ago Annual physical exam   [facility] [name] M, PA-C   9 months ago Primary osteoarthritis of left knee   [facility] [place], [place], [place]   1 year ago Dog bite, subsequent encounter   [place], [place], PA-C   1 year ago URI with cough and congestion   [place], [place]   1 year ago Closed fracture of right pubis, unspecified portion of pubis, sequela   [employer], [place] Muff, [place]      Future Appointments            In 3 weeks [employer], [place] in normal range and within 360 days    Potassium  Date Value Ref Range Status  07/16/2019 4.0 3.5 - 5.1 mmol/L Final         Passed - Ca in normal range and within 360 days    Calcium  Date Value Ref Range Status  07/16/2019 9.3 8.9 - 10.3 mg/dL Final         Passed - Cr in normal range and within 360 days    Creat  Date Value Ref Range Status  07/09/2018 0.98 (H) 0.60 - 0.93 mg/dL Final    Comment:    For patients >49 years  of age, the reference limit for Creatinine is approximately 13% higher for people identified as African-American. .    Creatinine, Ser  Date Value Ref Range Status  07/16/2019 0.83 0.44 - 1.00 mg/dL Final   Creatinine, POC  Date Value Ref Range Status  08/30/2017 NA mg/dL Final         Passed - Last BP in normal range    BP Readings from Last 1 Encounters:  07/16/19 128/75

## 2019-08-27 NOTE — Telephone Encounter (Signed)
Josie will you call her and fit her in for a CPE if possible for 09/17/19

## 2019-09-01 NOTE — Telephone Encounter (Signed)
LMTCB-Called patient to let her know provider is not in the office on Wednesday 05/12 but we can actually put her for the 1 of May at 2 pm with the [facility] for medicare wellness and to see the provider at 3pm(Jennifer Burnette). If patient calls back [facility] for the [facility] to schedule if patient agrees.

## 2019-09-16 NOTE — Progress Notes (Signed)
Subjective:   Jennifer [name] is a 79 y.o. female who presents for Medicare Annual (Subsequent) preventive examination.  This visit is being conducted via phone call  - after an attmept to do on video chat - due to the COVID-19 pandemic. This patient has given me verbal consent via phone to conduct this visit, patient states they are participating from their home address. Some vital signs may be absent or patient reported.    Patient identification: identified by name, DOB, and current address.  Review of Systems:  N/A  Cardiac Risk Factors include: advanced age (>30mn, >>1women);diabetes mellitus;hypertension     Objective:     Vitals: There were no vitals taken for this visit.  There is no height or weight on file to calculate BMI.  Advanced Directives 09/17/2019 07/15/2019 01/15/2019 01/14/2019 07/17/2018 06/20/2018 06/17/2018  Does Patient Have a Medical Advance Directive? Yes Yes;_0  No  Type of [place]amed[place] will - - - - - -  Does patient want to make changes to medical advance directive? - No - Patient declined - - - - -  Copy of [place] Chart? No - copy requested - - - - - -  Would patient like information on creating a medical advance directive? - - No - Patient declined - No - Patient declined No - Patient declined No - Patient declined    Tobacco Social History   Tobacco Use  Smoking Status Former Smoker  . Packs/day: 0.50  . Years: 6.00  . Pack years: 3.00  . Types: Cigarettes  . Quit date: 05/08/1987  . Years since quitting: 32.3  Smokeless Tobacco Never Used     Counseling given: Not Answered   Clinical Intake:  Pre-visit preparation completed: Yes  Pain : No/denies pain Pain Score: 0-No pain     Nutritional Risks: None Diabetes: Yes  How often do you need to have someone help you when you read instructions, pamphlets, or other written materials from your doctor or pharmacy?: 1 -  Never   Diabetes:  Is the patient diabetic?  Yes  If diabetic, was a CBG obtained today?  No  Did the patient bring in their glucometer from home?  No  How often do you monitor your CBG's? Once a day.   Financial Strains and Diabetes Management:  Are you having any financial strains with the device, your supplies or your medication? No .  Does the patient want to be seen by Chronic Care Management for management of their diabetes?  No  Would the patient like to be referred to a Nutritionist or for Diabetic Management?  No   Diabetic Exams:  Diabetic Eye Exam: Completed 05/29/17. Overdue for diabetic eye exam. Pt has been advised about the importance in completing this exam. Eye exam scheduled for 09/19/19 with Dr WEllin Mayhew  Diabetic Foot Exam: Completed 11/27/18. Repeat yearly.   Interpreter Needed?: No  Information [facility] :: [facility]  Past Medical History:  Diagnosis Date  . Allergy   . Arthritis   . Asthma   . Colon polyp   . Fatty liver   . GERD (gastroesophageal reflux disease)   . Hyperlipidemia   . Hypertension   . Motion sickness    boats   Past Surgical History:  Procedure Laterality Date  . ABDOMINAL HYSTERECTOMY  1978  . COLONOSCOPY WITH PROPOFOL N/A 02/01/2015   Procedure: COLONOSCOPY WITH PROPOFOL;  Surgeon: DLucilla Lame MD;  Location: [place];  Service: Endoscopy;  Laterality: N/A;  Diabetic - oral meds  . COLONOSCOPY WITH PROPOFOL N/A 11/22/2017   Procedure: COLONOSCOPY WITH PROPOFOL;  Surgeon: Lin Landsman, MD;  Location[facility] ENDOSCOPY;  Service: Gastroenterology;  Laterality: N/A;  . ESOPHAGOGASTRODUODENOSCOPY (EGD) WITH PROPOFOL N/A 07/12/2017   Procedure: ESOPHAGOGASTRODUODENOSCOPY (EGD) WITH PROPOFOL;  Surgeon: Lin Landsman, MD;  Location[facility]COPY;  Service: Gastroenterology;  Laterality: N/A;  . ESOPHAGOGASTRODUODENOSCOPY (EGD) WITH PROPOFOL N/A 08/16/2017   Procedure: ESOPHAGOGASTRODUODENOSCOPY (EGD) WITH  PROPOFOL;  Surgeon: Lin Landsman, MD;  Location: [facility] ENDOSCOPY;  Service: Gastroenterology;  Laterality: N/A;  . ESOPHAGOGASTRODUODENOSCOPY (EGD) WITH PROPOFOL N/A 09/20/2017   Procedure: ESOPHAGOGASTRODUODENOSCOPY (EGD) WITH PROPOFOL;  Surgeon: Lin Landsman, MD;  Location: [facility] ENDOSCOPY;  Service: Gastroenterology;  Laterality: N/A;  . ESOPHAGOGASTRODUODENOSCOPY (EGD) WITH PROPOFOL N/A 11/01/2017   Procedure: ESOPHAGOGASTRODUODENOSCOPY (EGD) WITH PROPOFOL;  Surgeon: Lin Landsman, MD;  Location: [facility] ENDOSCOPY;  Service: Gastroenterology;  Laterality: N/A;  . ESOPHAGOGASTRODUODENOSCOPY (EGD) WITH PROPOFOL N/A 01/17/2018   Procedure: ESOPHAGOGASTRODUODENOSCOPY (EGD) WITH PROPOFOL;  Surgeon: Lin Landsman, MD;  [facility]COPY;  Service: Gastroenterology;  Laterality: N/A;  . ESOPHAGOGASTRODUODENOSCOPY (EGD) WITH PROPOFOL N/A 06/20/2018   Procedure: ESOPHAGOGASTRODUODENOSCOPY (EGD) WITH PROPOFOL;  Surgeon: Lin Landsman, MD;  Location: [facility] END[facility]COPY;  Service: Gastroenterology;  Laterality: N/A;  EGD/ with banding ligation   . IR RADIOLOGIST EVAL & MGMT  07/09/2018  . POLYPECTOMY  02/01/2015   Procedure: POLYPECTOMY;  Surgeon: Lucilla Lame, MD;  Location: [facility];  Service: Endoscopy;;  . TONSILLECTOMY AND ADENOIDECTOMY     Family History  Problem Relation Age of Onset  . Dementia Mother   . Alcohol abuse Father   . Cancer Sister        lung  . Lymphoma Sister   . Melanoma Sister   . Lymphoma Sister    Social History   Socioeconomic History  . Marital status: Widowed    Spouse name: Not on file  . Number of children: 2  . Years of education: Not on file  . Highest education level: Some college, no degree  Occupational History  . Occupation: retired  Tobacco Use  . Smoking status: Former Smoker    Packs/day: 0.50    Years: 6.00    Pack years: 3.00    Types: Cigarettes    Quit date: 05/08/1987    Years since quitting: 32.3  .  Smokeless tobacco: Never Used  Substance and Sexual Activity  . Alcohol use: Never  . Drug use: Never  . Sexual activity: Not on file  Other Topics Concern  . Not on file  Social History Narrative  . Not on file   Social Determinants of Health   Financial Resource Strain: Low Risk   . Difficulty of Paying Living Expenses: Not hard at all  Food Insecurity: No Food Insecurity  . Worried About Charity fundraiser in the Last Year: Never true  . Ran Out of Food in the Last Year: Never true  Transportation Needs: No Transportation Needs  . Lack of Transportation (Medical): No  . Lack of Transportation (Non-Medical): No  Physical Activity: Inactive  . Days of Exercise per Week: 0 days  . Minutes of Exercise per Session: 0 min  Stress: No Stress Concern Present  . Feeling of Stress : Not at all  Social Connections: Somewhat Isolated  . Frequency of Communication with Friends and Family: More than three times a week  .  Frequency of Social Gatherings with Friends and Family: More than three times a week  . Attends Religious Services: More than 4 times per year  . Active Member of Clubs or Organizations: No  . Attends Archivist Meetings: Never  . Marital Status: Widowed    Outpatient Encounter Medications as of 09/17/2019  Medication Sig  . ACCU-CHEK AVIVA PLUS test strip To check blood sugar daily  . Blood Glucose Monitoring Suppl (ACCU-CHEK AVIVA PLUS) w/Device KIT To check blood sugar daily  . ergocalciferol (VITAMIN D2) 50000 units capsule Take 50,000 Units by mouth once a week. Every Friday when remembers  . furosemide (LASIX) 20 MG tablet TAKE ONE TABLET EVERY DAY  . Lancets (ACCU-CHEK SOFT TOUCH) lancets To check blood sugar daily  . nadolol (CORGARD) 20 MG tablet Take 0.5 tablets (10 mg total) by mouth daily.  . pantoprazole (PROTONIX) 40 MG tablet TAKE ONE TABLET BY MOUTH TWICE DAILY  . spironolactone (ALDACTONE) 100 MG tablet TAKE ONE TABLET BY MOUTH EVERY DAY  .  FLUZONE HIGH-DOSE QUADRIVALENT 0.7 ML SUSY    No facility-administered encounter medications on file as of 09/17/2019.    Activities of Daily Living In your present state of health, do you have any difficulty performing the following activities: 09/17/2019  Hearing? N  Vision? N  Difficulty concentrating or making decisions? N  Walking or climbing stairs? N  Dressing or bathing? N  Doing errands, shopping? N  Preparing Food and eating ? N  Using the Toilet? N  In the past six months, have you accidently leaked urine? N  Do you have problems with loss of bowel control? N  Managing your Medications? N  Managing your Finances? N  Housekeeping or managing your Housekeeping? N  Some recent data might be hidden    Patient Care Team: Mar Daring, PA-C as PCP - General (Family Medicine) Byrnett, Forest Gleason, MD (General Surgery) Anell Barr, OD as Consulting Physician (Optometry) Cammie Sickle, MD as Consulting Physician (Internal Medicine) Lin Landsman, MD as Consulting Physician (Gastroenterology) Sandi Mariscal, MD as Consulting Physician (Interventional Radiology)    Assessment:   This is a routine wellness examination for [place].  Exercise Activities and Dietary recommendations Current Exercise Habits: The patient does not participate in regular exercise at present, Exercise limited by: None identified  Goals      Patient Stated   . I want to be sure I'm taking the right medicines (pt-stated)     Current Barriers:  Marland Kitchen Knowledge Deficits related to use for each medication  Pharmacist Clinical Goal(s):  Marland Kitchen Over the next 30 days, patient will demonstrate improved understanding of prescribed medications and rationale for usage as evidenced by patient report  Interventions: . Comprehensive medication review performed. . Advised patient to use OTC magnesium supplements 456m nightly for leg cramping  o Updated 8/5: incorporated more bananas, fruits,  vegetables into diet and is no longer experiencing leg cramping at night . Discussed plans with patient for ongoing care management follow up and provided patient with direct contact information for care management team . Collaboration with provider re: medication management o Patient needs new diabetes testing supplies (glucometer, testing strips, lancets) sent to [facility] pharmacy o Patient needs new BP cuff - Suggest patient use OTC [employer] benefit catalog  Patient Self Care Activities:  . Self administers medications as prescribed . Calls provider office for new concerns or questions . Try Magnesium 4063mOTC for leg cramps  Please see past  updates related to this goal by clicking on the "Past Updates" button in the selected goal        Other   . Exercise 3x per week (30 min per time)     Recommend to start walking 3 days a week for 30 minutes.        Fall Risk: Fall Risk  09/17/2019 09/11/2018 08/23/2017 07/27/2016 07/11/2016  Falls in the past year? 0 1 No No No  Number falls in past yr: 0 0 - - -  Injury with Fall? 0 1 - - -  Comment - Broke pelvis in 09/19. - - -  Follow up - Falls prevention discussed - - -    FALL RISK PREVENTION PERTAINING TO THE HOME:  Any stairs in or around the home? No  If so, are there any without handrails? N/A  Home free of loose throw rugs in walkways, pet beds, electrical cords, etc? Yes  Adequate lighting in your home to reduce risk of falls? Yes   ASSISTIVE DEVICES UTILIZED TO PREVENT FALLS:  Life alert? No  Use of a cane, walker or w/c? No  Grab bars in the bathroom? Yes  Shower chair or bench in shower? No  Elevated toilet seat or a handicapped toilet? Yes   TIMED UP AND GO:  Was the test performed? No .    Depression Screen PHQ 2/9 Scores 09/17/2019 09/11/2018 09/11/2018 08/23/2017  PHQ - 2 Score 0 0 0 0  PHQ- 9 Score - 1 - 4     Cognitive Function     6CIT Screen 09/17/2019 09/11/2018 07/27/2016  What Year? 0 points 0 points 0  points  What month? 0 points 0 points 0 points  What time? 0 points 0 points 0 points  Count back from 20 0 points 0 points 0 points  Months in reverse 0 points 0 points 0 points  Repeat phrase 0 points 0 points 4 points  Total Score 0 0 4    Immunization History  Administered Date(s) Administered  . Hepatitis A 07/10/2011, 12/25/2011  . Hepatitis B 07/10/2011, 08/11/2011, 12/25/2011  . Influenza Split 02/24/2010, 03/27/2012  . Influenza, High Dose Seasonal PF 01/18/2015, 02/19/2016, 02/05/2017, 03/04/2018, 01/22/2019  . Influenza-Unspecified 01/22/2019  . Pneumococcal Conjugate-13 07/13/2014  . Pneumococcal Polysaccharide-23 01/27/2009  . Td 04/08/1996  . Tdap 02/24/2010, 06/17/2018    Qualifies for Shingles Vaccine? Yes . Due for Shingrix. Pt has been advised to call insurance company to determine out of pocket expense. Advised may also receive vaccine at local pharmacy or Health Dept. Verbalized acceptance and understanding.  Tdap: Up to date  Flu Vaccine: Up to date  Pneumococcal Vaccine: Completed series  Screening Tests Health Maintenance  Topic Date Due  . COVID-19 Vaccine (1) Never done  . OPHTHALMOLOGY EXAM  03/27/2018  . URINE MICROALBUMIN  08/31/2018  . HEMOGLOBIN A1C  05/30/2019  . FOOT EXAM  11/27/2019  . INFLUENZA VACCINE  12/07/2019  . DEXA SCAN  01/27/2020  . COLONOSCOPY  11/23/2022  . TETANUS/TDAP  06/17/2028  . PNA vac Low Risk Adult  Completed    Cancer Screenings:  Colorectal Screening: Completed 11/22/17. Repeat every 5 years.   Mammogram: Completed 03/23/17. Repeat every 1-2 years as advised.   Bone Density: Completed 01/27/15. Results reflect OSTEOPENIA. Repeat every 5 years.   Lung Cancer Screening: (Low Dose CT Chest recommended if Age 46-80 years, 30 pack-year currently smoking OR have quit w/in 15years.) does not qualify.   Additional Screening:  Dental  Screening: Recommended annual dental exams for proper oral hygiene   Community  Resource Referral:  CRR required this visit?  No       Plan:  I have personally reviewed and addressed the Medicare Annual Wellness questionnaire and have noted the following in the patient's chart:  A. Medical and social history B. Use of alcohol, tobacco or illicit drugs  C. Current medications and supplements D. Functional ability and status E.  Nutritional status F.  Physical activity G. Advance directives H. List of other physicians I.  Hospitalizations, surgeries, and ER visits in previous 12 months J.  [place] such as hearing and vision if needed, cognitive and depression L. Referrals and appointments   In addition, I have reviewed and discussed with patient certain preventive protocols, quality metrics, and best practice recommendations. A written personalized care plan for preventive services as well as general preventive health recommendations were provided to patient.   [place] Score, [place]  1/82/9937 Nurse Health Advisor   Nurse Notes: Pt needs a urine check and Hgb A1c check at next in office apt. Pt has an eye exam scheduled for 09/19/19.

## 2019-09-17 ENCOUNTER — Ambulatory Visit (INDEPENDENT_AMBULATORY_CARE_PROVIDER_SITE_OTHER): Payer: Medicare HMO

## 2019-09-17 ENCOUNTER — Other Ambulatory Visit: Payer: Self-pay

## 2019-09-17 DIAGNOSIS — Z0000 Encounter for general adult medical examination without abnormal findings: Secondary | ICD-10-CM | POA: Diagnosis not present

## 2019-09-17 NOTE — Patient Instructions (Signed)
Jennifer [name] , Thank you for taking time to come for your Medicare Wellness Visit. I appreciate your ongoing commitment to your health goals. Please review the following plan we discussed and let me know if I can assist you in the future.   Screening recommendations/referrals: Colonoscopy: Up to date, due 11/2022 Mammogram: No longer required.  Bone Density: Up to date, due 01/2020 Recommended yearly ophthalmology/optometry visit for glaucoma screening and checkup Recommended yearly dental visit for hygiene and checkup  Vaccinations: Influenza vaccine: Up to date Pneumococcal vaccine: Completed series Tdap vaccine: Up to date, due 06/2028 Shingles vaccine: Pt declines today.     Advanced directives: Please bring a copy of your POA (Power of Attorney) and/or Living Will to your next appointment.   Conditions/risks identified: Recommend to continue to work towards walking 3 days a week for at least 30 minutes at a time.   Next appointment: 12/05/19 @ 8:00 AM with [name].    Preventive Care 38 Years and Older, Female Preventive care refers to lifestyle choices and visits with your health care provider that can promote health and wellness. What does preventive care include?  A yearly physical exam. This is also called an annual well check.  Dental exams once or twice a year.  Routine eye exams. Ask your health care provider how often you should have your eyes checked.  Personal lifestyle choices, including:  Daily care of your teeth and gums.  Regular physical activity.  Eating a healthy diet.  Avoiding tobacco and drug use.  Limiting alcohol use.  Practicing safe sex.  Taking low-dose aspirin every day.  Taking vitamin and mineral supplements as recommended by your health care provider. What happens during an annual well check? The services and screenings done by your health care provider during your annual well check will depend on your age, overall health,  lifestyle risk factors, and family history of disease. Counseling  Your health care provider may ask you questions about your:  Alcohol use.  Tobacco use.  Drug use.  Emotional well-being.  Home and relationship well-being.  Sexual activity.  Eating habits.  History of falls.  Memory and ability to understand (cognition).  Work and work Statistician.  Reproductive health. Screening  You may have the following tests or measurements:  Height, weight, and BMI.  Blood pressure.  Lipid and cholesterol levels. These may be checked every 5 years, or more frequently if you are over 57 years old.  Skin check.  Lung cancer screening. You may have this screening every year starting at age 1 if you have a 30-pack-year history of smoking and currently smoke or have quit within the past 15 years.  Fecal occult blood test (FOBT) of the stool. You may have this test every year starting at age 33.  Flexible sigmoidoscopy or colonoscopy. You may have a sigmoidoscopy every 5 years or a colonoscopy every 10 years starting at age 56.  Hepatitis C blood test.  Hepatitis B blood test.  Sexually transmitted disease (STD) testing.  Diabetes screening. This is done by checking your blood sugar (glucose) after you have not eaten for a while (fasting). You may have this done every 1-3 years.  Bone density scan. This is done to screen for osteoporosis. You may have this done starting at age 66.  Mammogram. This may be done every 1-2 years. Talk to your health care provider about how often you should have regular mammograms. Talk with your health care provider about your test results, treatment  options, and if necessary, the need for more tests. Vaccines  Your health care provider may recommend certain vaccines, such as:  Influenza vaccine. This is recommended every year.  Tetanus, diphtheria, and acellular pertussis (Tdap, Td) vaccine. You may need a Td booster every 10 years.  Zoster  vaccine. You may need this after age 45.  Pneumococcal 13-valent conjugate (PCV13) vaccine. One dose is recommended after age 49.  Pneumococcal polysaccharide (PPSV23) vaccine. One dose is recommended after age 89. Talk to your health care provider about which screenings and vaccines you need and how often you need them. This information is not intended to replace advice given to you by your health care provider. Make sure you discuss any questions you have with your health care provider. Document Released: 05/21/2015 Document Revised: 01/12/2016 Document Reviewed: 02/23/2015 Elsevier Interactive Patient Education  2017 [place] Prevention in the Home Falls can cause injuries. They can happen to people of all ages. There are many things you can do to make your home safe and to help prevent falls. What can I do on the outside of my home?  Regularly fix the edges of walkways and driveways and fix any cracks.  Remove anything that might make you trip as you walk through a door, such as a raised step or threshold.  Trim any bushes or trees on the path to your home.  Use bright outdoor lighting.  Clear any walking paths of anything that might make someone trip, such as rocks or tools.  Regularly check to see if handrails are loose or broken. Make sure that both sides of any steps have handrails.  Any raised decks and porches should have guardrails on the edges.  Have any leaves, snow, or ice cleared regularly.  Use sand or salt on walking paths during winter.  Clean up any spills in your garage right away. This includes oil or grease spills. What can I do in the bathroom?  Use night lights.  Install grab bars by the toilet and in the tub and shower. Do not use towel bars as grab bars.  Use non-skid mats or decals in the tub or shower.  If you need to sit down in the shower, use a plastic, non-slip stool.  Keep the floor dry. Clean up any water that spills on the  floor as soon as it happens.  Remove soap buildup in the tub or shower regularly.  Attach bath mats securely with double-sided non-slip rug tape.  Do not have throw rugs and other things on the floor that can make you trip. What can I do in the bedroom?  Use night lights.  Make sure that you have a light by your bed that is easy to reach.  Do not use any sheets or blankets that are too big for your bed. They should not hang down onto the floor.  Have a firm chair that has side arms. You can use this for support while you get dressed.  Do not have throw rugs and other things on the floor that can make you trip. What can I do in the kitchen?  Clean up any spills right away.  Avoid walking on wet floors.  Keep items that you use a lot in easy-to-reach places.  If you need to reach something above you, use a strong step stool that has a grab bar.  Keep electrical cords out of the way.  Do not use floor polish or wax that makes floors slippery.  If you must use wax, use non-skid floor wax.  Do not have throw rugs and other things on the floor that can make you trip. What can I do with my stairs?  Do not leave any items on the stairs.  Make sure that there are handrails on both sides of the stairs and use them. Fix handrails that are broken or loose. Make sure that handrails are as long as the stairways.  Check any carpeting to make sure that it is firmly attached to the stairs. Fix any carpet that is loose or worn.  Avoid having throw rugs at the top or bottom of the stairs. If you do have throw rugs, attach them to the floor with carpet tape.  Make sure that you have a light switch at the top of the stairs and the bottom of the stairs. If you do not have them, ask someone to add them for you. What else can I do to help prevent falls?  Wear shoes that:  Do not have high heels.  Have rubber bottoms.  Are comfortable and fit you well.  Are closed at the toe. Do not wear  sandals.  If you use a stepladder:  Make sure that it is fully opened. Do not climb a closed stepladder.  Make sure that both sides of the stepladder are locked into place.  Ask someone to hold it for you, if possible.  Clearly mark and make sure that you can see:  Any grab bars or handrails.  First and last steps.  Where the edge of each step is.  Use tools that help you move around (mobility aids) if they are needed. These include:  Canes.  Walkers.  Scooters.  Crutches.  Turn on the lights when you go into a dark area. Replace any light bulbs as soon as they burn out.  Set up your furniture so you have a clear path. Avoid moving your furniture around.  If any of your floors are uneven, fix them.  If there are any pets around you, be aware of where they are.  Review your medicines with your doctor. Some medicines can make you feel dizzy. This can increase your chance of falling. Ask your doctor what other things that you can do to help prevent falls. This information is not intended to replace advice given to you by your health care provider. Make sure you discuss any questions you have with your health care provider. Document Released: 02/18/2009 Document Revised: 09/30/2015 Document Reviewed: 05/29/2014 Elsevier Interactive Patient Education  2017 [employer].

## 2019-09-19 DIAGNOSIS — H04129 Dry eye syndrome of unspecified lacrimal gland: Secondary | ICD-10-CM | POA: Diagnosis not present

## 2019-09-19 DIAGNOSIS — H40019 Open angle with borderline findings, low risk, unspecified eye: Secondary | ICD-10-CM | POA: Diagnosis not present

## 2019-09-19 DIAGNOSIS — H40013 Open angle with borderline findings, low risk, bilateral: Secondary | ICD-10-CM | POA: Diagnosis not present

## 2019-09-19 DIAGNOSIS — H2513 Age-related nuclear cataract, bilateral: Secondary | ICD-10-CM | POA: Diagnosis not present

## 2019-09-20 DIAGNOSIS — S32511D Fracture of superior rim of right pubis, subsequent encounter for fracture with routine healing: Secondary | ICD-10-CM | POA: Diagnosis not present

## 2019-10-06 ENCOUNTER — Other Ambulatory Visit: Payer: Self-pay | Admitting: Physician Assistant

## 2019-10-06 DIAGNOSIS — Z8719 Personal history of other diseases of the digestive system: Secondary | ICD-10-CM

## 2019-10-06 DIAGNOSIS — K7460 Unspecified cirrhosis of liver: Secondary | ICD-10-CM

## 2019-10-21 DIAGNOSIS — S32511D Fracture of superior rim of right pubis, subsequent encounter for fracture with routine healing: Secondary | ICD-10-CM | POA: Diagnosis not present

## 2019-11-12 DIAGNOSIS — H2589 Other age-related cataract: Secondary | ICD-10-CM | POA: Diagnosis not present

## 2019-11-12 DIAGNOSIS — E113592 Type 2 diabetes mellitus with proliferative diabetic retinopathy without macular edema, left eye: Secondary | ICD-10-CM | POA: Diagnosis not present

## 2019-11-12 DIAGNOSIS — H25043 Posterior subcapsular polar age-related cataract, bilateral: Secondary | ICD-10-CM | POA: Diagnosis not present

## 2019-11-12 DIAGNOSIS — H52213 Irregular astigmatism, bilateral: Secondary | ICD-10-CM | POA: Diagnosis not present

## 2019-11-12 DIAGNOSIS — H25013 Cortical age-related cataract, bilateral: Secondary | ICD-10-CM | POA: Diagnosis not present

## 2019-11-12 DIAGNOSIS — H2511 Age-related nuclear cataract, right eye: Secondary | ICD-10-CM | POA: Diagnosis not present

## 2019-11-12 DIAGNOSIS — H2513 Age-related nuclear cataract, bilateral: Secondary | ICD-10-CM | POA: Diagnosis not present

## 2019-11-12 DIAGNOSIS — H40013 Open angle with borderline findings, low risk, bilateral: Secondary | ICD-10-CM | POA: Diagnosis not present

## 2019-11-12 LAB — HM DIABETES EYE EXAM

## 2019-11-17 ENCOUNTER — Encounter: Payer: Self-pay | Admitting: Physician Assistant

## 2019-11-18 DIAGNOSIS — H2511 Age-related nuclear cataract, right eye: Secondary | ICD-10-CM | POA: Diagnosis not present

## 2019-11-18 DIAGNOSIS — H25811 Combined forms of age-related cataract, right eye: Secondary | ICD-10-CM | POA: Diagnosis not present

## 2019-11-20 DIAGNOSIS — S32511D Fracture of superior rim of right pubis, subsequent encounter for fracture with routine healing: Secondary | ICD-10-CM | POA: Diagnosis not present

## 2019-11-21 IMAGING — DX DG CHEST 1V
1 series · 1 of 1 positions shown · non-contrast
Comparison: Chest radiograph 07/04/2010.

CLINICAL DATA: Status post intubation.

EXAM:
CHEST 1 VIEW

[chest ap]
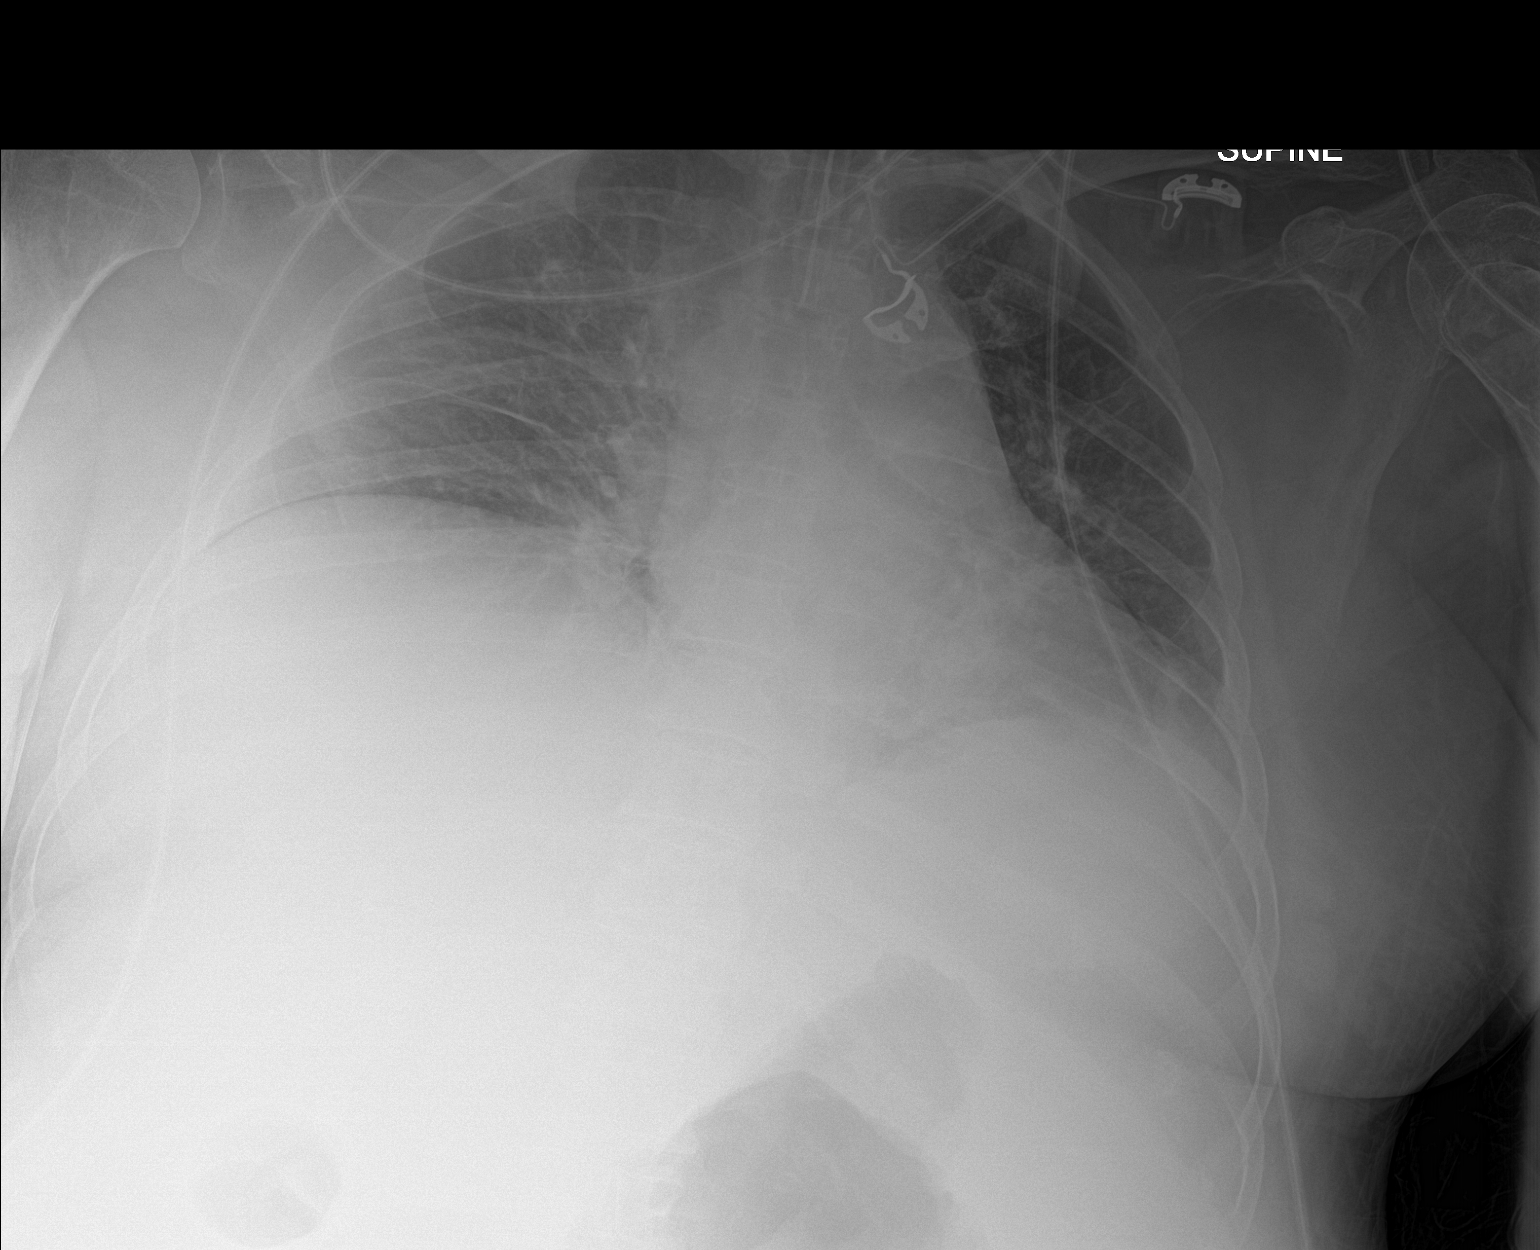

[1 of 1 positions shown; findings below may reference images not displayed]

FINDINGS: ET tube terminates in the mid trachea. Monitoring leads overlie the
patient. Low lung volumes. Bibasilar heterogeneous opacities. Stable
cardiac and mediastinal contours.
IMPRESSION: ET tube terminates in the mid trachea.

Low lung volumes with basilar opacities favored to represent
atelectasis. Infection not excluded.

## 2019-11-22 ENCOUNTER — Other Ambulatory Visit: Payer: Self-pay | Admitting: Physician Assistant

## 2019-11-22 DIAGNOSIS — K219 Gastro-esophageal reflux disease without esophagitis: Secondary | ICD-10-CM

## 2019-11-22 NOTE — Telephone Encounter (Signed)
Requested Prescriptions  Pending Prescriptions Disp Refills  . pantoprazole (PROTONIX) 40 MG tablet [Pharmacy Med Name: PANTOPRAZOLE SODIUM 40 MG DR TAB] 180 tablet 0    Sig: TAKE ONE TABLET BY MOUTH TWICE DAILY     Gastroenterology: Proton Pump Inhibitors Passed - 11/22/2019 11:58 AM      Passed - Valid encounter within last 12 months    Recent Outpatient Visits          12 months ago Annual physical exam   [facility] [place] [facility] [place], Clearnce Sorrel, [place]   1 year ago Primary osteoarthritis of left knee   [facility] [place], [place], [place]   1 year ago Dog bite, subsequent encounter   [place], [place], [place]   1 year ago URI with cough and congestion   [place], [place]   1 year ago Closed fracture of right pubis, unspecified portion of pubis, sequela   [place], [place]      Future Appointments            In 1 week Marlyn Corporal, [place]rel, PA-C [employer], [place]

## 2019-11-24 ENCOUNTER — Other Ambulatory Visit: Payer: Self-pay | Admitting: Physician Assistant

## 2019-11-24 DIAGNOSIS — M7989 Other specified soft tissue disorders: Secondary | ICD-10-CM

## 2019-11-26 DIAGNOSIS — H2511 Age-related nuclear cataract, right eye: Secondary | ICD-10-CM | POA: Diagnosis not present

## 2019-11-28 IMAGING — US US ABDOMEN LIMITED
1 series · 11 of 11 positions shown · non-contrast
Comparison: None.

CLINICAL DATA: Suspected ascites

EXAM:
LIMITED ABDOMEN ULTRASOUND FOR ASCITES
TECHNIQUE: Limited ultrasound survey for ascites was performed in all four
abdominal quadrants.

[Series 1: us abdomen limited · 0.26mm/px · 11 of 11 slices shown]
[im 1/11]
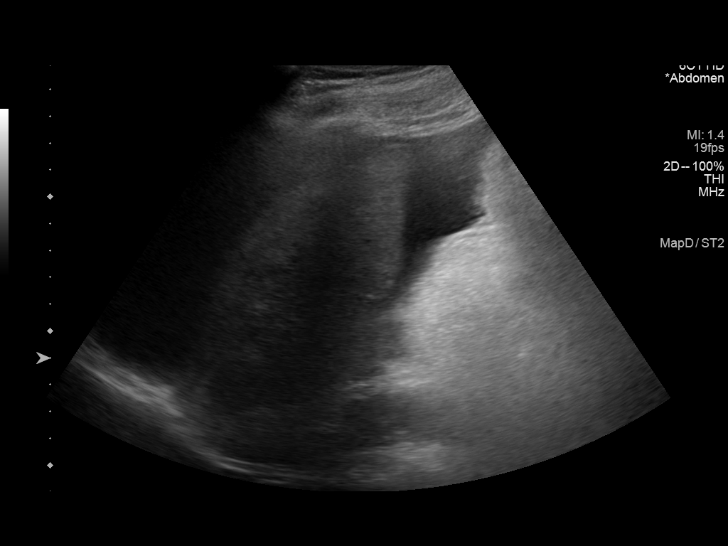
[im 2/11]
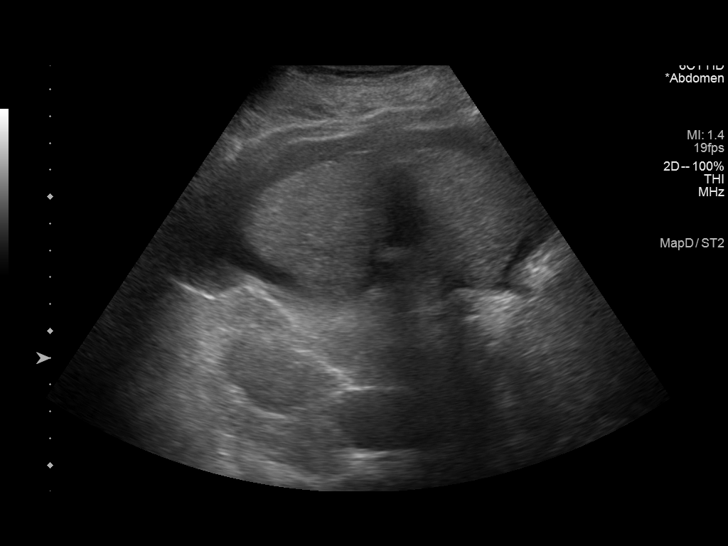
[im 3/11]
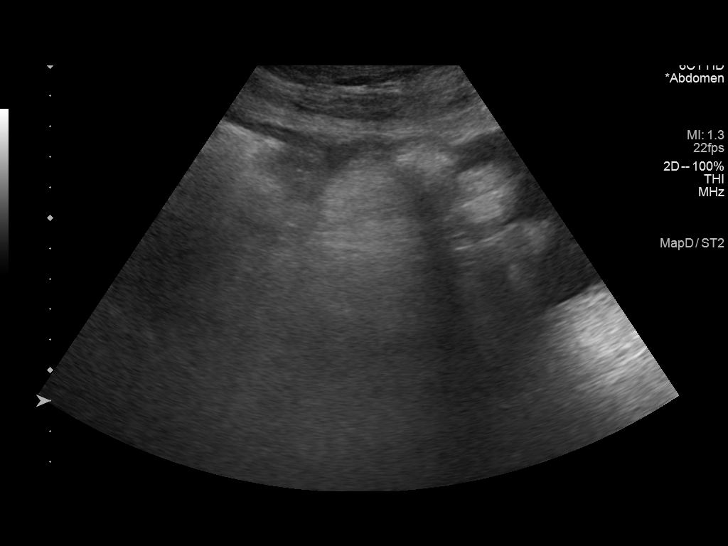
[im 4/11]
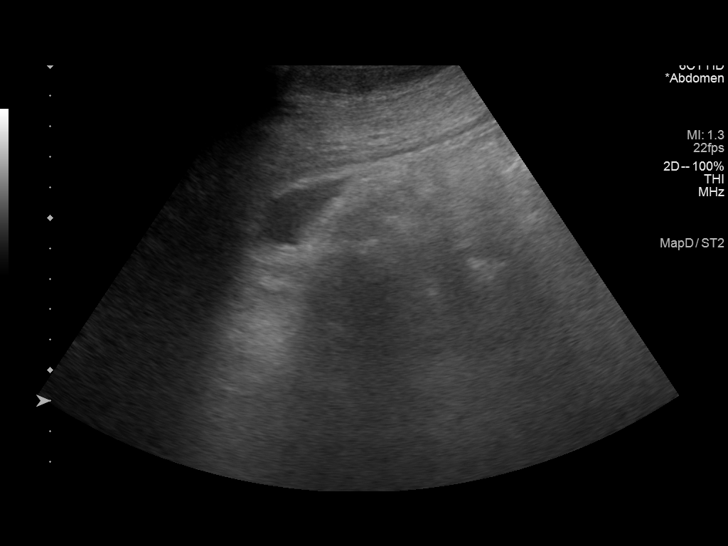
[im 5/11]
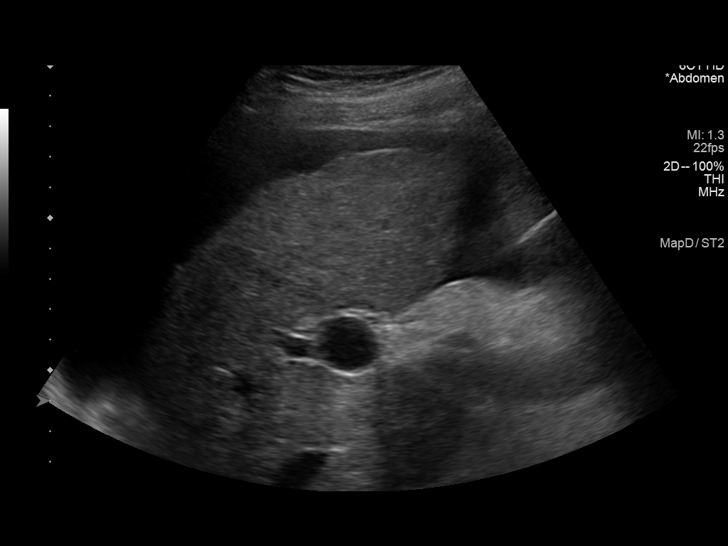
[im 6/11]
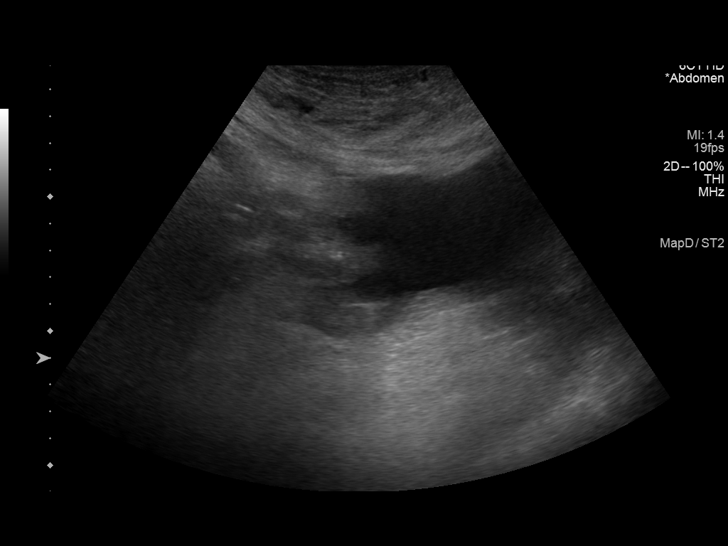
[im 7/11]
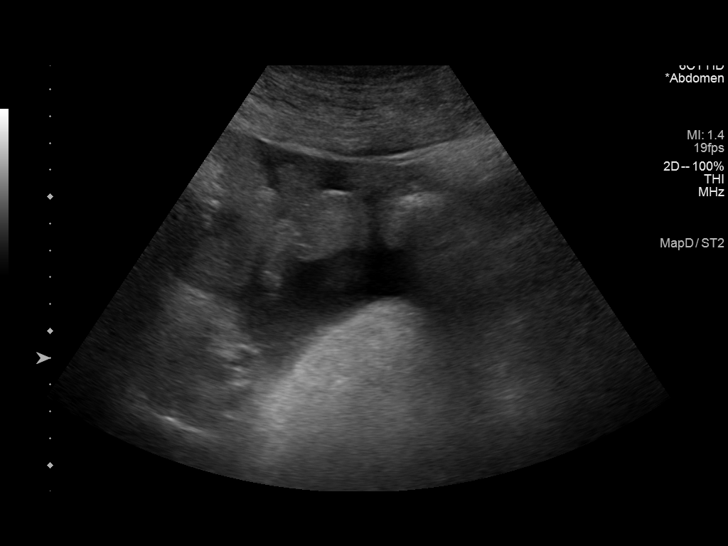
[im 8/11]
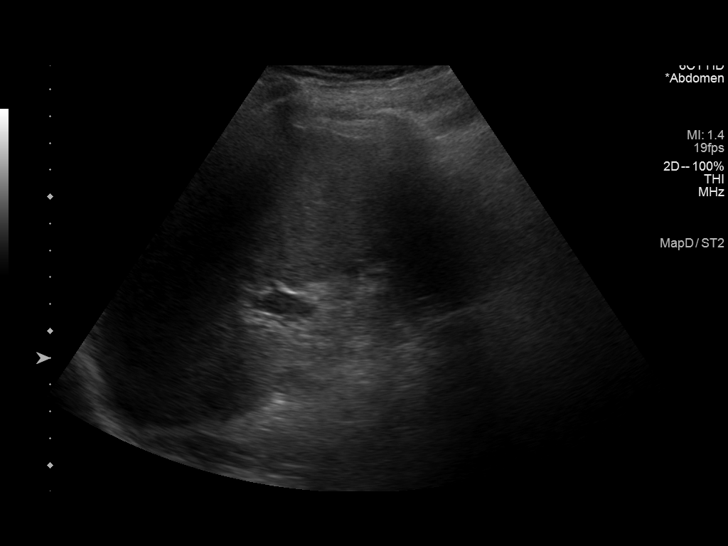
[im 9/11]
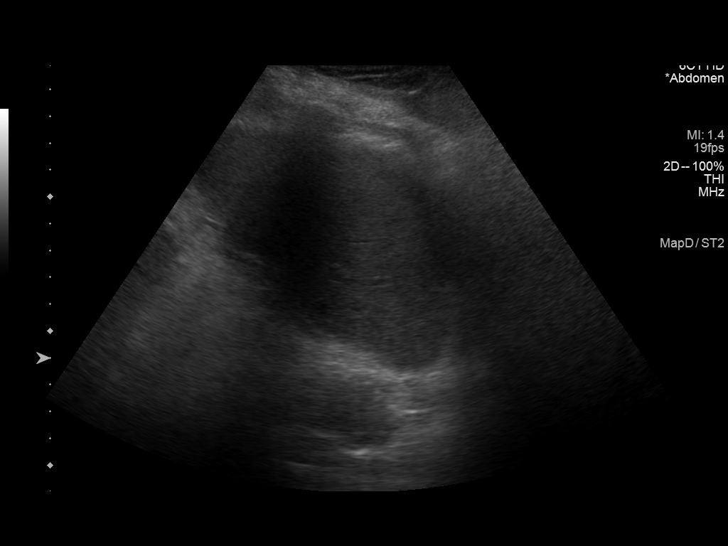
[im 10/11]
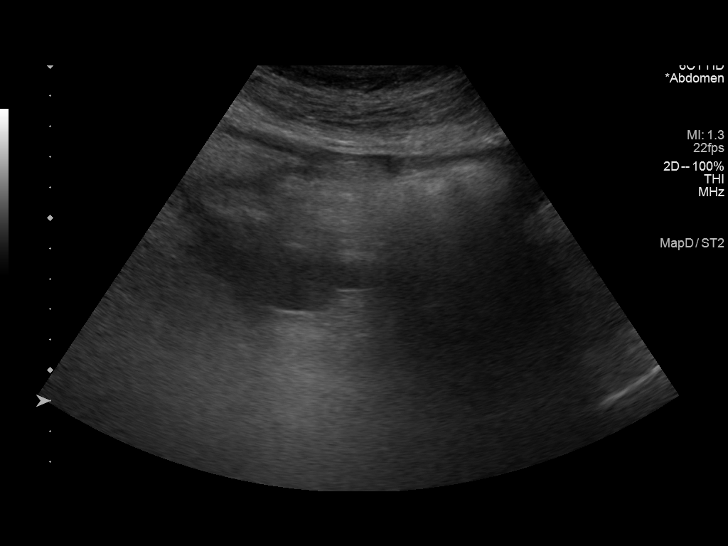
[im 11/11]
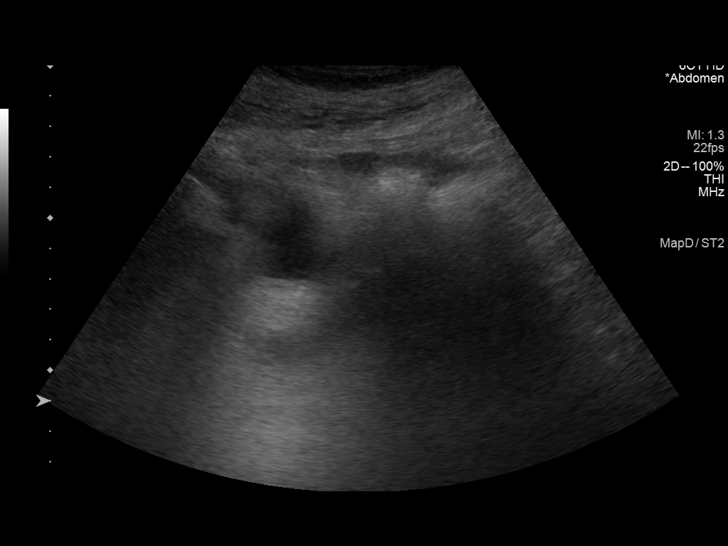

[11 of 11 positions shown; findings below may reference images not displayed]

FINDINGS: Only trace ascites is present about the liver. Paracentesis was not
performed.
IMPRESSION: Trace ascites.

## 2019-12-04 NOTE — Progress Notes (Signed)
Complete physical exam   Patient: Jennifer [name]   DOB: 14-Jan-1941   79 y.o. Female  MRN: [id number] Visit Date: 12/05/2019  Today's healthcare provider: Mar Daring, PA-C   Chief Complaint  Patient presents with  . Annual Exam   Subjective    Jennifer [name] is a 79 y.o. female who presents today for a complete physical exam.  She reports consuming a general diet. The patient does not participate in regular exercise at present. She generally feels well. She reports sleeping well. She does have additional problems to discuss today. She needs a handicap placard. HPI  She had her AWV with [facility] on 09/17/19 11/22/17-Colonoscopy She had eye surgery 11/18/19 on the right eye for cataracts. Left eye will be upcoming.   Past Medical History:  Diagnosis Date  . Allergy   . Arthritis   . Asthma   . Colon polyp   . Fatty liver   . GERD (gastroesophageal reflux disease)   . Hyperlipidemia   . Hypertension   . Motion sickness    boats   Past Surgical History:  Procedure Laterality Date  . ABDOMINAL HYSTERECTOMY  1978  . COLONOSCOPY WITH PROPOFOL N/A 02/01/2015   Procedure: COLONOSCOPY WITH PROPOFOL;  Surgeon: Lucilla Lame, MD;  Location: [place];  Service: Endoscopy;  Laterality: N/A;  Diabetic - oral meds  . COLONOSCOPY WITH PROPOFOL N/A 11/22/2017   Procedure: COLONOSCOPY WITH PROPOFOL;  Surgeon: Lin Landsman, MD;  Location: [facility] ENDOSCOPY;  Service: Gastroenterology;  Laterality: N/A;  . ESOPHAGOGASTRODUODENOSCOPY (EGD) WITH PROPOFOL N/A 07/12/2017   Procedure: ESOPHAGOGASTRODUODENOSCOPY (EGD) WITH PROPOFOL;  Surgeon: Lin Landsman, MD;  Location: [facility] ENDOSCOPY;  Service: Gastroenterology;  Laterality: N/A;  . ESOPHAGOGASTRODUODENOSCOPY (EGD) WITH PROPOFOL N/A 08/16/2017   Procedure: ESOPHAGOGASTRODUODENOSCOPY (EGD) WITH PROPOFOL;  Surgeon: Lin Landsman, MD;  Location: [facility] ENDOSCOPY;  Service: Gastroenterology;  Laterality: N/A;  .  ESOPHAGOGASTRODUODENOSCOPY (EGD) WITH PROPOFOL N/A 09/20/2017   Procedure: ESOPHAGOGASTRODUODENOSCOPY (EGD) WITH PROPOFOL;  Surgeon: Lin Landsman, MD;  Location: [facility] ENDOSCOPY;  Service: Gastroenterology;  Laterality: N/A;  . ESOPHAGOGASTRODUODENOSCOPY (EGD) WITH PROPOFOL N/A 11/01/2017   Procedure: ESOPHAGOGASTRODUODENOSCOPY (EGD) WITH PROPOFOL;  Surgeon: Lin Landsman, MD;  Location: [facility] ENDOSCOPY;  Service: Gastroenterology;  Laterality: N/A;  . ESOPHAGOGASTRODUODENOSCOPY (EGD) WITH PROPOFOL N/A 01/17/2018   Procedure: ESOPHAGOGASTRODUODENOSCOPY (EGD) WITH PROPOFOL;  Surgeon: Lin Landsman, MD;  Location: [facility] ENDOSCOPY;  Service: Gastroenterology;  Laterality: N/A;  . ESOPHAGOGASTRODUODENOSCOPY (EGD) WITH PROPOFOL N/A 06/20/2018   Procedure: ESOPHAGOGASTRODUODENOSCOPY (EGD) WITH PROPOFOL;  Surgeon: Lin Landsman, MD;  Location: [facility] ENDOSCOPY;  Service: Gastroenterology;  Laterality: N/A;  EGD/ with banding ligation   . IR RADIOLOGIST EVAL & MGMT  07/09/2018  . POLYPECTOMY  02/01/2015   Procedure: POLYPECTOMY;  Surgeon: Lucilla Lame, MD;  Location: [place];  Service: Endoscopy;;  . TONSILLECTOMY AND ADENOIDECTOMY     Social History   Socioeconomic History  . Marital status: Widowed    Spouse name: Not on file  . Number of children: 2  . Years of education: Not on file  . Highest education level: Some college, no degree  Occupational History  . Occupation: retired  Tobacco Use  . Smoking status: Former Smoker    Packs/day: 0.50    Years: 6.00    Pack years: 3.00    Types: Cigarettes    Quit date: 05/08/1987    Years since quitting: 32.6  . Smokeless tobacco: Never Used  Vaping Use  .  Vaping Use: Never used  Substance and Sexual Activity  . Alcohol use: Never  . Drug use: Never  . Sexual activity: Not on file  Other Topics Concern  . Not on file  Social History Narrative  . Not on file   Social Determinants of Health   Financial Resource  Strain: Low Risk   . Difficulty of Paying Living Expenses: Not hard at all  Food Insecurity: No Food Insecurity  . Worried About Charity fundraiser in the Last Year: Never true  . Ran Out of Food in the Last Year: Never true  Transportation Needs: No Transportation Needs  . Lack of Transportation (Medical): No  . Lack of Transportation (Non-Medical): No  Physical Activity: Inactive  . Days of Exercise per Week: 0 days  . Minutes of Exercise per Session: 0 min  Stress: No Stress Concern Present  . Feeling of Stress : Not at all  Social Connections: Moderately Isolated  . Frequency of Communication with Friends and Family: More than three times a week  . Frequency of Social Gatherings with Friends and Family: More than three times a week  . Attends Religious Services: More than 4 times per year  . Active Member of Clubs or Organizations: No  . Attends Archivist Meetings: Never  . Marital Status: Widowed  Intimate Partner Violence: Not At Risk  . Fear of Current or Ex-Partner: No  . Emotionally Abused: No  . Physically Abused: No  . Sexually Abused: No   Family Status  Relation Name Status  . Mother  Deceased at age 69       in her sleep  . Father  Deceased at age 46       was beaten  . Sister Philis Pique  . Sister [employer]  . Brother  Deceased at age 26       hx MI  . Brother  Deceased at age 72       melanoma  . Brother  Deceased at age 48       cancer   Family History  Problem Relation Age of Onset  . Dementia Mother   . Alcohol abuse Father   . Cancer Sister        lung  . Lymphoma Sister   . Melanoma Sister   . Lymphoma Sister    Allergies  Allergen Reactions  . Amoxicillin Other (See Comments)    Yeast infections  . Codeine Other (See Comments)    Pt denies  . Hydrocodone-Acetaminophen Other (See Comments)    Pt denies  . Nitrofurantoin Other (See Comments)  . Nitrofurantoin Monohyd Macro     "Sugar got high"  . Penicillins     Has  patient had a PCN reaction causing immediate rash, facial/tongue/throat swelling, SOB or lightheadedness with hypotension: Unknown Has patient had a PCN reaction causing severe rash involving mucus membranes or skin necrosis: Unknown Has patient had a PCN reaction that required hospitalization: Unknown Has patient had a PCN reaction occurring within the last 10 years: Unknown If all of the above answers are "NO", then may proceed with Cephalosporin use.  . Sulfa Antibiotics Other (See Comments)    Patient Care Team: Mar Daring, PA-C as PCP - General (Family Medicine) Byrnett, Forest Gleason, MD (General Surgery) Anell Barr, OD as Consulting Physician (Optometry) Cammie Sickle, MD as Consulting Physician (Internal Medicine) Lin Landsman, MD as Consulting Physician (Gastroenterology) Sandi Mariscal, MD as Consulting Physician (Interventional Radiology)  Medications: Outpatient Medications Prior to Visit  Medication Sig  . ACCU-CHEK AVIVA PLUS test strip To check blood sugar daily  . Blood Glucose Monitoring Suppl (ACCU-CHEK AVIVA PLUS) w/Device KIT To check blood sugar daily  . ergocalciferol (VITAMIN D2) 50000 units capsule Take 50,000 Units by mouth once a week. Every Friday when remembers  . furosemide (LASIX) 20 MG tablet TAKE ONE TABLET EVERY DAY  . Lancets (ACCU-CHEK SOFT TOUCH) lancets To check blood sugar daily  . nadolol (CORGARD) 20 MG tablet TAKE ONE-HALF TABLET BY MOUTH EVERY DAY  . pantoprazole (PROTONIX) 40 MG tablet TAKE ONE TABLET BY MOUTH TWICE DAILY  . spironolactone (ALDACTONE) 100 MG tablet TAKE ONE TABLET BY MOUTH EVERY DAY  . FLUZONE HIGH-DOSE QUADRIVALENT 0.7 ML SUSY    No facility-administered medications prior to visit.    Review of Systems  Constitutional: Negative.   HENT: Negative.   Eyes: Positive for photophobia.  Respiratory: Positive for wheezing.   Cardiovascular: Negative.   Gastrointestinal: Positive for diarrhea.    Endocrine: Negative.   Genitourinary: Negative.   Musculoskeletal: Positive for joint swelling.  Skin: Negative.   Allergic/Immunologic: Negative.   Neurological: Negative.   Hematological: Negative.   Psychiatric/Behavioral: Negative.     Last CBC Lab Results  Component Value Date   WBC 2.4 (L) 07/16/2019   HGB 11.7 (L) 07/16/2019   HCT 34.8 (L) 07/16/2019   MCV 92.3 07/16/2019   MCH 31.0 07/16/2019   RDW 13.0 07/16/2019   PLT 52 (L) 69/67/8938   Last metabolic panel Lab Results  Component Value Date   GLUCOSE 150 (H) 07/16/2019   NA 133 (L) 07/16/2019   K 4.0 07/16/2019   CL 95 (L) 07/16/2019   CO2 28 07/16/2019   BUN 16 07/16/2019   CREATININE 0.83 07/16/2019   GFRNONAA >60 07/16/2019   GFRAA >60 07/16/2019   CALCIUM 9.3 07/16/2019   PHOS 3.2 07/13/2017   PROT 6.9 07/16/2019   ALBUMIN 3.9 07/16/2019   LABGLOB 2.4 11/27/2018   AGRATIO 1.6 11/27/2018   BILITOT 1.5 (H) 07/16/2019   ALKPHOS 58 07/16/2019   AST 30 07/16/2019   ALT 17 07/16/2019   ANIONGAP 10 07/16/2019      Objective    BP 105/69 (BP Location: Left Arm, Patient Position: Sitting, Cuff Size: Large)   Pulse 69   Temp 98.8 F (37.1 C) (Oral)   Resp 16   Ht _0  (1.549 m)   Wt 179 lb 9.6 oz (81.5 kg)   BMI 33.94 kg/m  BP Readings from Last 3 Encounters:  12/05/19 105/69  07/16/19 128/75  01/15/19 130/66   Wt Readings from Last 3 Encounters:  12/05/19 179 lb 9.6 oz (81.5 kg)  07/16/19 174 lb 12.8 oz (79.3 kg)  01/15/19 171 lb (77.6 kg)      Physical Exam Vitals reviewed.  Constitutional:      General: She is not in acute distress.    Appearance: Normal appearance. She is well-developed. She is obese. She is not ill-appearing or diaphoretic.  HENT:     Head: Normocephalic and atraumatic.     Right Ear: Tympanic membrane, ear canal and external ear normal.     Left Ear: Tympanic membrane, ear canal and external ear normal.  Eyes:     General: No scleral icterus.       Right  eye: No discharge.        Left eye: No discharge.     Extraocular Movements: Extraocular movements intact.  Conjunctiva/sclera: Conjunctivae normal.     Pupils: Pupils are equal, round, and reactive to light.  Neck:     Thyroid: No thyromegaly.     Vascular: No carotid bruit or JVD.     Trachea: No tracheal deviation.  Cardiovascular:     Rate and Rhythm: Normal rate and regular rhythm.     Pulses: Normal pulses.     Heart sounds: Normal heart sounds. No murmur heard.  No friction rub. No gallop.   Pulmonary:     Effort: Pulmonary effort is normal. No respiratory distress.     Breath sounds: Normal breath sounds. No wheezing or rales.  Chest:     Chest wall: No tenderness.  Abdominal:     General: Abdomen is flat. Bowel sounds are normal. There is no distension.     Palpations: Abdomen is soft. There is no mass.     Tenderness: There is no abdominal tenderness. There is no guarding or rebound.  Musculoskeletal:        General: No tenderness. Normal range of motion.     Cervical back: Normal range of motion and neck supple.     Right lower leg: No edema.     Left lower leg: No edema.  Lymphadenopathy:     Cervical: No cervical adenopathy.  Skin:    General: Skin is warm and dry.     Capillary Refill: Capillary refill takes less than 2 seconds.     Findings: No rash.     Comments: Large V shaped skin tear healing on the left forearm  Seborrheic keratosis lesions, most notable is one on the right upper eyelid. No concerning features but has strong family history of melanoma  Neurological:     General: No focal deficit present.     Mental Status: She is alert and oriented to person, place, and time. Mental status is at baseline.  Psychiatric:        Mood and Affect: Mood normal.        Behavior: Behavior normal.        Thought Content: Thought content normal.        Judgment: Judgment normal.     Diabetic Foot Exam - Simple   Simple Foot Form Diabetic Foot exam was  performed with the following findings: Yes 12/05/2019  9:09 AM  Visual Inspection No deformities, no ulcerations, no other skin breakdown bilaterally: Yes Sensation Testing Intact to touch and monofilament testing bilaterally: Yes Pulse Check Posterior Tibialis and Dorsalis pulse intact bilaterally: Yes Comments      Last depression screening scores PHQ 2/9 Scores 09/17/2019 09/11/2018 09/11/2018  PHQ - 2 Score 0 0 0  PHQ- 9 Score - 1 -   Last fall risk screening Fall Risk  09/17/2019  Falls in the past year? 0  Number falls in past yr: 0  Injury with Fall? 0  Comment -  Follow up -   Last Audit-C alcohol use screening Alcohol Use Disorder Test (AUDIT) 09/17/2019  1. How often do you have a drink containing alcohol? 0  2. How many drinks containing alcohol do you have on a typical day when you are drinking? 0  3. How often do you have six or more drinks on one occasion? 0  AUDIT-C Score 0  Alcohol Brief Interventions/Follow-up AUDIT Score <7 follow-up not indicated   A score of 3 or more in women, and 4 or more in men indicates increased risk for alcohol abuse, EXCEPT if all of the points  are from question 1   No results found for any visits on 12/05/19.  Assessment & Plan    Routine Health Maintenance and Physical Exam  Exercise Activities and Dietary recommendations Goals    .  Exercise 3x per week (30 min per time)      Recommend to start walking 3 days a week for 30 minutes.     .  I want to be sure I'm taking the right medicines (pt-stated)      Current Barriers:  Marland Kitchen Knowledge Deficits related to use for each medication  Pharmacist Clinical Goal(s):  Marland Kitchen Over the next 30 days, patient will demonstrate improved understanding of prescribed medications and rationale for usage as evidenced by patient report  Interventions: . Comprehensive medication review performed. . Advised patient to use OTC magnesium supplements 469m nightly for leg cramping  o Updated 8/5:  incorporated more bananas, fruits, vegetables into diet and is no longer experiencing leg cramping at night . Discussed plans with patient for ongoing care management follow up and provided patient with direct contact information for care management team . Collaboration with provider re: medication management o Patient needs new diabetes testing supplies (glucometer, testing strips, lancets) sent to [facility] o Patient needs new BP cuff - Suggest patient use OTC [employer] benefit catalog  Patient Self Care Activities:  . Self administers medications as prescribed . Calls provider office for new concerns or questions . Try Magnesium [id number] for leg cramps  Please see past updates related to this goal by clicking on the "Past Updates" button in the selected goal         Immunization History  Administered Date(s) Administered  . Hepatitis A 07/10/2011, 12/25/2011  . Hepatitis B 07/10/2011, 08/11/2011, 12/25/2011  . Influenza Split 02/24/2010, 03/27/2012  . Influenza, High Dose Seasonal PF 01/18/2015, 02/19/2016, 02/05/2017, 03/04/2018, 01/22/2019  . Influenza-Unspecified 01/22/2019  . Pneumococcal Conjugate-13 07/13/2014  . Pneumococcal Polysaccharide-23 01/27/2009  . Td 04/08/1996  . Tdap 02/24/2010, 06/17/2018    Health Maintenance  Topic Date Due  . COVID-19 Vaccine (1) Never done  . URINE MICROALBUMIN  08/31/2018  . HEMOGLOBIN A1C  05/30/2019  . FOOT EXAM  11/27/2019  . INFLUENZA VACCINE  12/07/2019  . DEXA SCAN  01/27/2020  . OPHTHALMOLOGY EXAM  11/11/2020  . COLONOSCOPY  11/23/2022  . TETANUS/TDAP  06/17/2028  . Hepatitis C Screening  Completed  . PNA vac Low Risk Adult  Completed    Discussed health benefits of physical activity, and encouraged her to engage in regular exercise appropriate for her age and condition.  1. Annual physical exam Normal physical exam today. Will check labs as below and f/u pending lab results. If labs are stable and WNL she  will not need to have these rechecked for one year at her next annual physical exam. She is to call the office in the meantime if she has any acute issue, questions or concerns.  2. Mild asthma with status asthmaticus, unspecified whether persistent Stable. Diagnosis pulled for medication refill. Continue current medical treatment plan. - albuterol (VENTOLIN HFA) 108 (90 Base) MCG/ACT inhaler; Inhale 1-2 puffs into the lungs every 6 (six) hours as needed for wheezing or shortness of breath.  Dispense: 18 g; Refill: 1  3. Type 2 diabetes mellitus with diabetic polyneuropathy, without long-term current use of insulin (HCC) Stable. Diet controlled. Will check labs as below and f/u pending results. - CBC with Differential/Platelet - Comprehensive metabolic panel - Hemoglobin A1c - Lipid panel  4. Essential hypertension, benign Stable. Continue Nadolol 32m taking 0.5 tab daily and furosemide 228mdaily. Will check labs as below and f/u pending results. - CBC with Differential/Platelet - Comprehensive metabolic panel - Hemoglobin A1c - Lipid panel  5. [facility] and Dr. BrRogue BussingHem/Onc. Continue Spironolactone. Will check labs as below and f/u pending results. - CBC with Differential/Platelet - Comprehensive metabolic panel - Fe+TIBC+Fer  6. Vitamin D deficiency H/O this and post menopausal. Will check labs as below and f/u pending results. - CBC with Differential/Platelet - Vitamin D (25 hydroxy)  7. Class 1 obesity due to excess calories with serious comorbidity and body mass index (BMI) of 33.0 to 33.9 in adult Counseled patient on healthy lifestyle modifications including dieting and exercise.  - Comprehensive metabolic panel - Hemoglobin A1c - Lipid panel - TSH  8. Skin lesion Multiple seborrheic keratoses. FHx of melanoma in brother, cause of death in his 5035sand in her sister. [facility]  - Ambulatory referral to Dermatology  9. Iron deficiency H/O this due to splenomegaly from liver cirrhosis. Will check labs as below and f/u pending results. - Fe+TIBC+Fer   No follow-ups on file.     I,Reynolds BowlPA-C, have reviewed all documentation for this visit. The documentation on 12/05/19 for the exam, diagnosis, procedures, and orders are all accurate and complete.   JeRubye BeachBuStarpoint Surgery Center [place] [phone]) [phone])  [place]

## 2019-12-05 ENCOUNTER — Other Ambulatory Visit: Payer: Self-pay

## 2019-12-05 ENCOUNTER — Ambulatory Visit (INDEPENDENT_AMBULATORY_CARE_PROVIDER_SITE_OTHER): Payer: Medicare HMO | Admitting: Physician Assistant

## 2019-12-05 ENCOUNTER — Encounter: Payer: Self-pay | Admitting: Physician Assistant

## 2019-12-05 VITALS — BP 105/69 | HR 69 | Temp 98.8°F | Resp 16 | Ht 61.0 in | Wt 179.6 lb

## 2019-12-05 DIAGNOSIS — E6609 Other obesity due to excess calories: Secondary | ICD-10-CM | POA: Diagnosis not present

## 2019-12-05 DIAGNOSIS — Z6833 Body mass index (BMI) 33.0-33.9, adult: Secondary | ICD-10-CM | POA: Diagnosis not present

## 2019-12-05 DIAGNOSIS — J45902 Unspecified asthma with status asthmaticus: Secondary | ICD-10-CM

## 2019-12-05 DIAGNOSIS — K7581 Nonalcoholic steatohepatitis (NASH): Secondary | ICD-10-CM | POA: Diagnosis not present

## 2019-12-05 DIAGNOSIS — E611 Iron deficiency: Secondary | ICD-10-CM | POA: Diagnosis not present

## 2019-12-05 DIAGNOSIS — E1142 Type 2 diabetes mellitus with diabetic polyneuropathy: Secondary | ICD-10-CM

## 2019-12-05 DIAGNOSIS — Z0000 Encounter for general adult medical examination without abnormal findings: Secondary | ICD-10-CM

## 2019-12-05 DIAGNOSIS — L989 Disorder of the skin and subcutaneous tissue, unspecified: Secondary | ICD-10-CM | POA: Diagnosis not present

## 2019-12-05 DIAGNOSIS — I10 Essential (primary) hypertension: Secondary | ICD-10-CM | POA: Diagnosis not present

## 2019-12-05 DIAGNOSIS — E559 Vitamin D deficiency, unspecified: Secondary | ICD-10-CM | POA: Diagnosis not present

## 2019-12-05 DIAGNOSIS — K7460 Unspecified cirrhosis of liver: Secondary | ICD-10-CM

## 2019-12-05 MED ORDER — ALBUTEROL SULFATE HFA 108 (90 BASE) MCG/ACT IN AERS: 1.0000 | INHALATION_SPRAY | Freq: Four times a day (QID) | RESPIRATORY_TRACT | 1 refills | Status: DC | PRN

## 2019-12-05 NOTE — Patient Instructions (Signed)
Health Maintenance After Age 80 After age 80, you are at a higher risk for certain long-term diseases and infections as well as injuries from falls. Falls are a major cause of broken bones and head injuries in people who are older than age 80. Getting regular preventive care can help to keep you healthy and well. Preventive care includes getting regular testing and making lifestyle changes as recommended by your health care provider. Talk with your health care provider about:  Which screenings and tests you should have. A screening is a test that checks for a disease when you have no symptoms.  A diet and exercise plan that is right for you. What should I know about screenings and tests to prevent falls? Screening and testing are the best ways to find a health problem early. Early diagnosis and treatment give you the best chance of managing medical conditions that are common after age 80. Certain conditions and lifestyle choices may make you more likely to have a fall. Your health care provider may recommend:  Regular vision checks. Poor vision and conditions such as cataracts can make you more likely to have a fall. If you wear glasses, make sure to get your prescription updated if your vision changes.  Medicine review. Work with your health care provider to regularly review all of the medicines you are taking, including over-the-counter medicines. Ask your health care provider about any side effects that may make you more likely to have a fall. Tell your health care provider if any medicines that you take make you feel dizzy or sleepy.  Osteoporosis screening. Osteoporosis is a condition that causes the bones to get weaker. This can make the bones weak and cause them to break more easily.  Blood pressure screening. Blood pressure changes and medicines to control blood pressure can make you feel dizzy.  Strength and balance checks. Your health care provider may recommend certain tests to check your  strength and balance while standing, walking, or changing positions.  Foot health exam. Foot pain and numbness, as well as not wearing proper footwear, can make you more likely to have a fall.  Depression screening. You may be more likely to have a fall if you have a fear of falling, feel emotionally low, or feel unable to do activities that you used to do.  Alcohol use screening. Using too much alcohol can affect your balance and may make you more likely to have a fall. What actions can I take to lower my risk of falls? General instructions  Talk with your health care provider about your risks for falling. Tell your health care provider if: ? You fall. Be sure to tell your health care provider about all falls, even ones that seem minor. ? You feel dizzy, sleepy, or off-balance.  Take over-the-counter and prescription medicines only as told by your health care provider. These include any supplements.  Eat a healthy diet and maintain a healthy weight. A healthy diet includes low-fat dairy products, low-fat (lean) meats, and fiber from whole grains, beans, and lots of fruits and vegetables. Home safety  Remove any tripping hazards, such as rugs, cords, and clutter.  Install safety equipment such as grab bars in bathrooms and safety rails on stairs.  Keep rooms and walkways well-lit. Activity   Follow a regular exercise program to stay fit. This will help you maintain your balance. Ask your health care provider what types of exercise are appropriate for you.  If you need a cane or   walker, use it as recommended by your health care provider.  Wear supportive shoes that have nonskid soles. Lifestyle  Do not drink alcohol if your health care provider tells you not to drink.  If you drink alcohol, limit how much you have: ? 0-1 drink a day for women. ? 0-2 drinks a day for men.  Be aware of how much alcohol is in your drink. In the [place] one drink equals one typical bottle of beer (12  oz), one-half glass of wine (5 oz), or one shot of hard liquor (1 oz).  Do not use any products that contain nicotine or tobacco, such as cigarettes and e-cigarettes. If you need help quitting, ask your health care provider. Summary  Having a healthy lifestyle and getting preventive care can help to protect your health and wellness after age 80.  Screening and testing are the best way to find a health problem early and help you avoid having a fall. Early diagnosis and treatment give you the best chance for managing medical conditions that are more common for people who are older than age 80.  Falls are a major cause of broken bones and head injuries in people who are older than age 80. Take precautions to prevent a fall at home.  Work with your health care provider to learn what changes you can make to improve your health and wellness and to prevent falls. This information is not intended to replace advice given to you by your health care provider. Make sure you discuss any questions you have with your health care provider. Document Revised: 08/15/2018 Document Reviewed: 03/07/2017 Elsevier Patient Education  2020 Elsevier Inc.  

## 2019-12-06 LAB — CBC WITH DIFFERENTIAL/PLATELET
Basophils Absolute: 0 10*3/uL (ref 0.0–0.2)
Basos: 0 %
EOS (ABSOLUTE): 0.1 10*3/uL (ref 0.0–0.4)
Eos: 3 %
Hematocrit: 33.6 % — ABNORMAL LOW (ref 34.0–46.6)
Hemoglobin: 11.5 g/dL (ref 11.1–15.9)
Immature Grans (Abs): 0 10*3/uL (ref 0.0–0.1)
Immature Granulocytes: 0 %
Lymphocytes Absolute: 0.5 10*3/uL — ABNORMAL LOW (ref 0.7–3.1)
Lymphs: 18 %
MCH: 30.3 pg (ref 26.6–33.0)
MCHC: 34.2 g/dL (ref 31.5–35.7)
MCV: 88 fL (ref 79–97)
Monocytes Absolute: 0.3 10*3/uL (ref 0.1–0.9)
Monocytes: 10 %
Neutrophils Absolute: 1.9 10*3/uL (ref 1.4–7.0)
Neutrophils: 69 %
Platelets: 52 10*3/uL — CL (ref 150–450)
RBC: 3.8 x10E6/uL (ref 3.77–5.28)
RDW: 12.8 % (ref 11.7–15.4)
WBC: 2.7 10*3/uL — ABNORMAL LOW (ref 3.4–10.8)

## 2019-12-06 LAB — COMPREHENSIVE METABOLIC PANEL
ALT: 10 IU/L (ref 0–32)
AST: 23 IU/L (ref 0–40)
Albumin/Globulin Ratio: 1.4 (ref 1.2–2.2)
Albumin: 3.8 g/dL (ref 3.7–4.7)
Alkaline Phosphatase: 73 IU/L (ref 48–121)
BUN/Creatinine Ratio: 11 — ABNORMAL LOW (ref 12–28)
BUN: 10 mg/dL (ref 8–27)
Bilirubin Total: 1.3 mg/dL — ABNORMAL HIGH (ref 0.0–1.2)
CO2: 26 mmol/L (ref 20–29)
Calcium: 9.1 mg/dL (ref 8.7–10.3)
Chloride: 90 mmol/L — ABNORMAL LOW (ref 96–106)
Creatinine, Ser: 0.87 mg/dL (ref 0.57–1.00)
GFR calc Af Amer: 73 mL/min/{1.73_m2} (ref >59)
GFR calc non Af Amer: 64 mL/min/{1.73_m2} (ref >59)
Globulin, Total: 2.7 g/dL (ref 1.5–4.5)
Glucose: 121 mg/dL — ABNORMAL HIGH (ref 65–99)
Potassium: 4.4 mmol/L (ref 3.5–5.2)
Sodium: 129 mmol/L — ABNORMAL LOW (ref 134–144)
Total Protein: 6.5 g/dL (ref 6.0–8.5)

## 2019-12-06 LAB — LIPID PANEL
Chol/HDL Ratio: 3.3 ratio (ref 0.0–4.4)
Cholesterol, Total: 178 mg/dL (ref 100–199)
HDL: 54 mg/dL (ref >39)
LDL Chol Calc (NIH): 111 mg/dL — ABNORMAL HIGH (ref 0–99)
Triglycerides: 68 mg/dL (ref 0–149)
VLDL Cholesterol Cal: 13 mg/dL (ref 5–40)

## 2019-12-06 LAB — IRON,TIBC AND FERRITIN PANEL
Ferritin: 37 ng/mL (ref 15–150)
Iron Saturation: 36 % (ref 15–55)
Iron: 132 ug/dL (ref 27–139)
Total Iron Binding Capacity: 370 ug/dL (ref 250–450)
UIBC: 238 ug/dL (ref 118–369)

## 2019-12-06 LAB — TSH: TSH: 1.28 u[IU]/mL (ref 0.450–4.500)

## 2019-12-06 LAB — HEMOGLOBIN A1C
Est. average glucose Bld gHb Est-mCnc: 148 mg/dL
Hgb A1c MFr Bld: 6.8 % — ABNORMAL HIGH (ref 4.8–5.6)

## 2019-12-06 LAB — VITAMIN D 25 HYDROXY (VIT D DEFICIENCY, FRACTURES): Vit D, 25-Hydroxy: 27 ng/mL — ABNORMAL LOW (ref 30.0–100.0)

## 2019-12-08 ENCOUNTER — Telehealth: Payer: Self-pay

## 2019-12-08 NOTE — Telephone Encounter (Signed)
-----   Message from Mar Daring, [place] sent at 12/08/2019 12:30 PM EDT ----- Blood count is stable for you. Kidney and liver function are normal. Sodium and chloride are low so would recommend to add some table salt to foods in small amounts. Potassium and calcium are normal. Bilirubin is stable. A1c did increase from 6.4 to now 6.8. May need to consider medications to help lower sugar. Also working on limiting carbohydrates and sugars in diet can help. Recheck these labs in 3 months. Cholesterol is normal. Thyroid is normal. Vit D is borderline low. Make sure to take daily OTC vit D 1000-2000 IU daily. Iron is normal.

## 2019-12-08 NOTE — Telephone Encounter (Signed)
Patient advised as directed below. Reports that she doesn't want any medication at this time for her sugar. She already started taking the Vitamin D

## 2019-12-15 DIAGNOSIS — H25042 Posterior subcapsular polar age-related cataract, left eye: Secondary | ICD-10-CM | POA: Diagnosis not present

## 2019-12-15 DIAGNOSIS — H25012 Cortical age-related cataract, left eye: Secondary | ICD-10-CM | POA: Diagnosis not present

## 2019-12-15 DIAGNOSIS — H2512 Age-related nuclear cataract, left eye: Secondary | ICD-10-CM | POA: Diagnosis not present

## 2019-12-15 DIAGNOSIS — H2589 Other age-related cataract: Secondary | ICD-10-CM | POA: Diagnosis not present

## 2019-12-15 LAB — HM DIABETES EYE EXAM

## 2019-12-16 ENCOUNTER — Encounter: Payer: Self-pay | Admitting: Physician Assistant

## 2019-12-24 ENCOUNTER — Telehealth: Payer: Self-pay | Admitting: *Deleted

## 2019-12-24 NOTE — Chronic Care Management (AMB) (Signed)
  Chronic Care Management   Note  12/24/2019 Name: Jennifer [name] MRN: [id number] DOB: 1940/11/21  Jennifer [name] is a 79 y.o. year old female who is a primary care patient of Mar Daring, [place]. I reached out to [name] by phone today in response to a referral sent by Jennifer [name]'s health plan.     Jennifer [name] was given information about Chronic Care Management services today including:  1. CCM service includes personalized support from designated clinical staff supervised by her physician, including individualized plan of care and coordination with other care providers 2. 24/7 contact phone numbers for assistance for urgent and routine care needs. 3. Service will only be billed when office clinical staff spend 20 minutes or more in a month to coordinate care. 4. Only one practitioner may furnish and bill the service in a calendar month. 5. The patient may stop CCM services at any time (effective at the end of the month) by phone call to the office staff. 6. The patient will be responsible for cost sharing (co-pay) of up to 20% of the service fee (after annual deductible is met).  Patient agreed to services and verbal consent obtained.   Follow up plan: Telephone appointment with care management team member scheduled for: 01/02/2020  [facility]  [place], [place] [address] Direct Dial: [email or web address] Website: [email or web address]

## 2019-12-31 ENCOUNTER — Telehealth: Payer: Self-pay | Admitting: Physician Assistant

## 2019-12-31 NOTE — Telephone Encounter (Signed)
Patient brought back a form for Tawanna Sat to fill out.  Wrong form was filled out according to [facility] for patient to receive a handicap placard.  Form was placed in [name]'s box.  Please call patient back when ready for pick up.  Thanks, [employer]

## 2020-01-01 ENCOUNTER — Telehealth: Payer: Self-pay

## 2020-01-01 NOTE — Telephone Encounter (Signed)
Copied from [place] [phone]. Topic: General - Call Back - No Documentation >> Jan 01, 2020 10:25 AM Jaynie Collins D wrote: Reason for CRM: Patient states she needs to have her license plate tag paperwork before 01/05/2020. Please contact patient if able to be completed.

## 2020-01-01 NOTE — Telephone Encounter (Signed)
New form completed.

## 2020-01-01 NOTE — Telephone Encounter (Signed)
Advised patient as below.  

## 2020-01-01 NOTE — Telephone Encounter (Signed)
Advised pt that forms are completed per [place].

## 2020-01-02 ENCOUNTER — Ambulatory Visit (INDEPENDENT_AMBULATORY_CARE_PROVIDER_SITE_OTHER): Payer: Medicare HMO | Admitting: Pharmacist

## 2020-01-02 ENCOUNTER — Other Ambulatory Visit: Payer: Self-pay

## 2020-01-02 DIAGNOSIS — E118 Type 2 diabetes mellitus with unspecified complications: Secondary | ICD-10-CM

## 2020-01-02 DIAGNOSIS — I10 Essential (primary) hypertension: Secondary | ICD-10-CM | POA: Diagnosis not present

## 2020-01-02 NOTE — Chronic Care Management (AMB) (Signed)
 Chronic Care Management Pharmacy  Name: Jennifer [name]  MRN: [id number] DOB: 10/04/1940  Chief Complaint/ HPI  Khyli C Bosshart,  79 y.o. , female presents for their Initial CCM visit with the clinical pharmacist via telephone due to COVID-19 Pandemic.  PCP : Burnette, Jennifer M, PA-C  Their chronic conditions include: HTN, HLD, DM  Office Visits:  7/30 annual, Burnette, BP 105/69 [address] 179.5 BMI 33.9, no diet/exercise, NASH Fe deficiency,    Consult Visit: 3/10 NASH cirrhosis, BP 128/75 P 67 BMI 33.0, easy bruising,   Medications: Outpatient Encounter Medications as of 01/02/2020  Medication Sig  . ACCU-CHEK AVIVA PLUS test strip To check blood sugar daily  . albuterol (VENTOLIN HFA) 108 (90 Base) MCG/ACT inhaler Inhale 1-2 puffs into the lungs every 6 (six) hours as needed for wheezing or shortness of breath.  . Blood Glucose Monitoring Suppl (ACCU-CHEK AVIVA PLUS) w/Device KIT To check blood sugar daily  . ergocalciferol (VITAMIN D2) 50000 units capsule Take 50,000 Units by mouth once a week. Every Friday when remembers  . FLUZONE HIGH-DOSE QUADRIVALENT 0.7 ML SUSY   . furosemide (LASIX) 20 MG tablet TAKE ONE TABLET EVERY DAY  . Lancets (ACCU-CHEK SOFT TOUCH) lancets To check blood sugar daily  . Magnesium 200 MG TABS Take 1 tablet by mouth daily.  . nadolol (CORGARD) 20 MG tablet TAKE ONE-HALF TABLET BY MOUTH EVERY DAY  . pantoprazole (PROTONIX) 40 MG tablet TAKE ONE TABLET BY MOUTH TWICE DAILY  . spironolactone (ALDACTONE) 100 MG tablet TAKE ONE TABLET BY MOUTH EVERY DAY   No facility-administered encounter medications on file as of 01/02/2020.      Financial Resource Strain: Low Risk   . Difficulty of Paying Living Expenses: Not hard at all    Current Diagnosis/Assessment:  Goals Addressed            This Visit's Progress   . Chronic Care Management       CARE PLAN ENTRY (see longitudinal plan of care for additional care plan information)  Current  Barriers:  . Chronic Disease Management support, education, and care coordination needs related to Hypertension, Hyperlipidemia, Diabetes, and Asthma   Hypertension BP Readings from Last 3 Encounters:  12/05/19 105/69  07/16/19 128/75  01/15/19 130/66   . Pharmacist Clinical Goal(s): o Over the next 90 days, patient will work with PharmD and providers to maintain BP goal <140/90 . Current regimen:  o Nadolol 20mg 1/2 tab twice daily o Spironolactone 100mg daily . Interventions: o None . Patient self care activities - Over the next 90 days, patient will: o Check BP weekly, document, and provide at future appointments o Ensure daily salt intake < 2300 mg/day  Hyperlipidemia Lab Results  Component Value Date/Time   LDLCALC 111 (H) 12/05/2019 08:56 AM   . Pharmacist Clinical Goal(s): o Over the next 90 days, patient will work with PharmD and providers to achieve LDL goal < 100 . Current regimen:  o None . Interventions: o New statin start age > 75 not evidence based, patient declined . Patient self care activities - Over the next 90 days, patient will: o Continue to improve diet and exercise  Diabetes Lab Results  Component Value Date/Time   HGBA1C 6.8 (H) 12/05/2019 08:56 AM   HGBA1C 6.4 (H) 11/27/2018 10:55 AM   . Pharmacist Clinical Goal(s): o Over the next 90 days, patient will work with PharmD and providers to maintain A1c goal <7% . Current regimen:  o None .   Interventions: o None . Patient self care activities - Over the next 90 days, patient will: o Check blood sugar once daily, document, and provide at future appointments o Contact provider with any episodes of hypoglycemia o Continue lifestyle improvements  Medication management . Pharmacist Clinical Goal(s): o Over the next 90 days, patient will work with PharmD and providers to maintain optimal medication adherence . Current pharmacy: [facility] . Interventions o Comprehensive medication review  performed. o Continue current medication management strategy . Patient self care activities - Over the next 90 days, patient will: o Focus on medication adherence by getting medications delivered if helpful o Take medications as prescribed o Report any questions or concerns to PharmD and/or provider(s)  Initial goal documentation       Diabetes   Recent Relevant Labs: Lab Results  Component Value Date/Time   HGBA1C 6.8 (H) 12/05/2019 08:56 AM   HGBA1C 6.4 (H) 11/27/2018 10:55 AM   MICROALBUR 20 08/30/2017 10:11 AM   MICROALBUR 20 07/27/2016 10:17 AM     Checking BG: Daily  Recent pre-meal BG readings: 113, 117, 114 Patient has failed these meds in past: metformin Patient is currently controlled on the following medications: None  Last diabetic Foot exam:  Lab Results  Component Value Date/Time   HMDIABEYEEXA Retinopathy (A) 12/15/2019 12:00 AM    Last diabetic Eye exam:  Lab Results  Component Value Date/Time   HMDIABFOOTEX normal 07/13/2014 12:00 AM     We discussed:  Got glucometer last year? Yes Iron normal Lost 3# Metformin cramping/diarrhea  Plan  Continue control with diet and exercise   Hypertension   BP goal is:  <140/90  Office blood pressures are  BP Readings from Last 3 Encounters:  12/05/19 105/69  07/16/19 128/75  01/15/19 130/66   Patient checks BP at home daily Patient home BP readings are ranging: 102/52, 115/55  Patient has failed these meds in the past: NA Patient is currently controlled on the following medications:  . Nadolol 74m bid . Spironolactone 1030mdaily  We discussed: Got BP cuff last year  May start taking Lasix twice daily Some dizziness  Plan  Continue current medications   Hyperlipidemia   LDL goal < 100   Lipid Panel     Component Value Date/Time   CHOL 178 12/05/2019 0856   TRIG 68 12/05/2019 0856   HDL 54 12/05/2019 0856   LDLCALC 111 (H) 12/05/2019 0856    Hepatic Function Latest Ref Rng &  Units 12/05/2019 07/16/2019 01/15/2019  Total Protein 6.0 - 8.5 g/dL 6.5 6.9 6.7  Albumin 3.7 - 4.7 g/dL 3.8 3.9 3.8  AST 0 - 40 IU/L _0 ALT 0 - 32 IU/L _1 Alk Phosphatase 48 - 121 IU/L 73 58 65  Total Bilirubin 0.0 - 1.2 mg/dL 1.3(H) 1.5(H) 1.5(H)     The ASCVD Risk score (Goff DC Jr., et al., 2013) failed to calculate for the following reasons:   The 2013 ASCVD risk score is only valid for ages 4040o 7919 Patient has failed these meds in past: NA Patient is currently uncontrolled on the following medications:  . None  We discussed: New statin start for 80yo not evidence based  Plan  Continue control with diet and exercise  Medication Management   Pt uses [facility] pharmacy for all medications Uses pill box? Yes Pt endorses 100% compliance  We discussed:  [place] from [facility], "best pharmacy I've ever had"  Using rescue inhaler nightly Lactose intolerant No pharmacy information for Vit D2  Plan  Continue current medication management strategy  Follow up: 3 month phone visit   , PharmD, BCGP, CTTS Clinical Pharmacist [facility] [phone]       

## 2020-01-04 NOTE — Patient Instructions (Addendum)
Visit Information  Goals Addressed            This Visit's Progress   . Chronic Care Management       CARE PLAN ENTRY (see longitudinal plan of care for additional care plan information)  Current Barriers:  . Chronic Disease Management support, education, and care coordination needs related to Hypertension, Hyperlipidemia, Diabetes, and Asthma   Hypertension BP Readings from Last 3 Encounters:  12/05/19 105/69  07/16/19 128/75  01/15/19 130/66   . Pharmacist Clinical Goal(s): o Over the next 90 days, patient will work with PharmD and providers to maintain BP goal <140/90 . Current regimen:  o Nadolol 38m 1/2 tab twice daily o Spironolactone 10102mdaily . Interventions: o None . Patient self care activities - Over the next 90 days, patient will: o Check BP weekly, document, and provide at future appointments o Ensure daily salt intake < 2300 mg/day  Hyperlipidemia Lab Results  Component Value Date/Time   LDLCALC 111 (H) 12/05/2019 08:56 AM   . Pharmacist Clinical Goal(s): o Over the next 90 days, patient will work with PharmD and providers to achieve LDL goal < 100 . Current regimen:  o None . Interventions: o New statin start age > 7539ot evidence based, patient declined . Patient self care activities - Over the next 90 days, patient will: o Continue to improve diet and exercise  Diabetes Lab Results  Component Value Date/Time   HGBA1C 6.8 (H) 12/05/2019 08:56 AM   HGBA1C 6.4 (H) 11/27/2018 10:55 AM   . Pharmacist Clinical Goal(s): o Over the next 90 days, patient will work with PharmD and providers to maintain A1c goal <7% . Current regimen:  o None . Interventions: o None . Patient self care activities - Over the next 90 days, patient will: o Check blood sugar once daily, document, and provide at future appointments o Contact provider with any episodes of hypoglycemia o Continue lifestyle improvements  Medication management . Pharmacist Clinical  Goal(s): o Over the next 90 days, patient will work with PharmD and providers to maintain optimal medication adherence . Current pharmacy: [facility] . Interventions o Comprehensive medication review performed. o Continue current medication management strategy . Patient self care activities - Over the next 90 days, patient will: o Focus on medication adherence by getting medications delivered if helpful o Take medications as prescribed o Report any questions or concerns to PharmD and/or provider(s)  Initial goal documentation        Ms. GiTreloaras given information about Chronic Care Management services today including:  1. CCM service includes personalized support from designated clinical staff supervised by her physician, including individualized plan of care and coordination with other care providers 2. 24/7 contact phone numbers for assistance for urgent and routine care needs. 3. Standard insurance, coinsurance, copays and deductibles apply for chronic care management only during months in which we provide at least 20 minutes of these services. Most insurances cover these services at 100%, however patients may be responsible for any copay, coinsurance and/or deductible if applicable. This service may help you avoid the need for more expensive face-to-face services. 4. Only one practitioner may furnish and bill the service in a calendar month. 5. The patient may stop CCM services at any time (effective at the end of the month) by phone call to the office staff.  Patient agreed to services and verbal consent obtained.   Print copy of patient instructions provided.  Telephone follow up appointment with pharmacy team  member scheduled for: 3 months  Milus Height, PharmD, BCGP, CTTS Clinical Pharmacist [facility] [phone]    Asthma Attack Prevention, Adult Although you may not be able to control the fact that you have asthma, you can take actions to prevent  episodes of asthma (asthma attacks). These actions include:  Creating a written plan for managing and treating your asthma attacks (asthma action plan).  Monitoring your asthma.  Avoiding things that can irritate your airways or make your asthma symptoms worse (asthma triggers).  Taking your medicines as directed.  Acting quickly if you have signs or symptoms of an asthma attack. What are some ways to prevent an asthma attack? Create a plan Work with your health care provider to create an asthma action plan. This plan should include:  A list of your asthma triggers and how to avoid them.  A list of symptoms that you experience during an asthma attack.  Information about when to take medicine and how much medicine to take.  Information to help you understand your peak flow measurements.  Contact information for your health care providers.  Daily actions that you can take to control asthma. Monitor your asthma To monitor your asthma:  Use your peak flow meter every morning and every evening for 2-3 weeks. Record the results in a journal. A drop in your peak flow numbers on one or more days may mean that you are starting to have an asthma attack, even if you are not having symptoms.  When you have asthma symptoms, write them down in a journal.  Avoid asthma triggers Work with your health care provider to find out what your asthma triggers are. This can be done by:  Being tested for allergies.  Keeping a journal that notes when asthma attacks occur and what may have contributed to them.  Asking your health care provider whether other medical conditions make your asthma worse. Common asthma triggers include:  Dust.  Smoke. This includes campfire smoke and secondhand smoke from tobacco products.  Pet dander.  Trees, grasses or pollens.  Very cold, dry, or humid air.  Mold.  Foods that contain high amounts of sulfites.  Strong smells.  Engine exhaust and air  pollution.  Aerosol sprays and fumes from household cleaners.  Household pests and their droppings, including dust mites and cockroaches.  Certain medicines, including NSAIDs. Once you have determined your asthma triggers, take steps to avoid them. Depending on your triggers, you may be able to reduce the chance of an asthma attack by:  Keeping your home clean. Have someone dust and vacuum your home for you 1 or 2 times a week. If possible, have them use a high-efficiency particulate arrestance (HEPA) vacuum.  Washing your sheets weekly in hot water.  Using allergy-proof mattress covers and casings on your bed.  Keeping pets out of your home.  Taking care of mold and water problems in your home.  Avoiding areas where people smoke.  Avoiding using strong perfumes or odor sprays.  Avoid spending a lot of time outdoors when pollen counts are high and on very windy days.  Talking with your health care provider before stopping or starting any new medicines. Medicines Take over-the-counter and prescription medicines only as told by your health care provider. Many asthma attacks can be prevented by carefully following your medicine schedule. Taking your medicines correctly is especially important when you cannot avoid certain asthma triggers. Even if you are doing well, do not stop taking your medicine and  do not take less medicine. Act quickly If an asthma attack happens, acting quickly can decrease how severe it is and how long it lasts. Take these actions:  Pay attention to your symptoms. If you are coughing, wheezing, or having difficulty breathing, do not wait to see if your symptoms go away on their own. Follow your asthma action plan.  If you have followed your asthma action plan and your symptoms are not improving, call your health care provider or seek immediate medical care at the nearest hospital. It is important to write down how often you need to use your fast-acting rescue  inhaler. You can track how often you use an inhaler in your journal. If you are using your rescue inhaler more often, it may mean that your asthma is not under control. Adjusting your asthma treatment plan may help you to prevent future asthma attacks and help you to gain better control of your condition. How can I prevent an asthma attack when I exercise? Exercise is a common asthma trigger. To prevent asthma attacks during exercise:  Follow advice from your health care provider about whether you should use your fast-acting inhaler before exercising. Many people with asthma experience exercise-induced bronchoconstriction (EIB). This condition often worsens during vigorous exercise in cold, humid, or dry environments. Usually, people with EIB can stay very active by using a fast-acting inhaler before exercising.  Avoid exercising outdoors in very cold or humid weather.  Avoid exercising outdoors when pollen counts are high.  Warm up and cool down when exercising.  Stop exercising right away if asthma symptoms start. Consider taking part in exercises that are less likely to cause asthma symptoms such as:  Indoor swimming.  Biking.  Walking.  Hiking.  Playing football. This information is not intended to replace advice given to you by your health care provider. Make sure you discuss any questions you have with your health care provider. Document Revised: 04/06/2017 Document Reviewed: 10/09/2015 Elsevier Patient Education  2020 [employer].

## 2020-01-13 DIAGNOSIS — H2512 Age-related nuclear cataract, left eye: Secondary | ICD-10-CM | POA: Diagnosis not present

## 2020-01-13 DIAGNOSIS — H25042 Posterior subcapsular polar age-related cataract, left eye: Secondary | ICD-10-CM | POA: Diagnosis not present

## 2020-01-13 DIAGNOSIS — H25012 Cortical age-related cataract, left eye: Secondary | ICD-10-CM | POA: Diagnosis not present

## 2020-01-13 DIAGNOSIS — H2589 Other age-related cataract: Secondary | ICD-10-CM | POA: Diagnosis not present

## 2020-01-13 DIAGNOSIS — H25812 Combined forms of age-related cataract, left eye: Secondary | ICD-10-CM | POA: Diagnosis not present

## 2020-01-14 ENCOUNTER — Inpatient Hospital Stay: Payer: Medicare HMO | Admitting: Internal Medicine

## 2020-01-14 ENCOUNTER — Inpatient Hospital Stay: Payer: Medicare HMO

## 2020-01-21 DIAGNOSIS — H2512 Age-related nuclear cataract, left eye: Secondary | ICD-10-CM | POA: Diagnosis not present

## 2020-01-23 ENCOUNTER — Other Ambulatory Visit: Payer: Self-pay

## 2020-01-23 ENCOUNTER — Inpatient Hospital Stay (HOSPITAL_BASED_OUTPATIENT_CLINIC_OR_DEPARTMENT_OTHER): Payer: Medicare HMO | Admitting: Internal Medicine

## 2020-01-23 ENCOUNTER — Inpatient Hospital Stay: Payer: Medicare HMO | Attending: Internal Medicine

## 2020-01-23 VITALS — BP 128/60 | HR 68 | Temp 97.7°F | Resp 16 | Ht 61.0 in | Wt 175.6 lb

## 2020-01-23 DIAGNOSIS — K7581 Nonalcoholic steatohepatitis (NASH): Secondary | ICD-10-CM

## 2020-01-23 DIAGNOSIS — E871 Hypo-osmolality and hyponatremia: Secondary | ICD-10-CM | POA: Diagnosis not present

## 2020-01-23 DIAGNOSIS — Z87891 Personal history of nicotine dependence: Secondary | ICD-10-CM | POA: Insufficient documentation

## 2020-01-23 DIAGNOSIS — D696 Thrombocytopenia, unspecified: Secondary | ICD-10-CM

## 2020-01-23 DIAGNOSIS — D649 Anemia, unspecified: Secondary | ICD-10-CM | POA: Insufficient documentation

## 2020-01-23 DIAGNOSIS — D6959 Other secondary thrombocytopenia: Secondary | ICD-10-CM | POA: Insufficient documentation

## 2020-01-23 DIAGNOSIS — Z801 Family history of malignant neoplasm of trachea, bronchus and lung: Secondary | ICD-10-CM | POA: Diagnosis not present

## 2020-01-23 DIAGNOSIS — Z807 Family history of other malignant neoplasms of lymphoid, hematopoietic and related tissues: Secondary | ICD-10-CM | POA: Diagnosis not present

## 2020-01-23 DIAGNOSIS — K7460 Unspecified cirrhosis of liver: Secondary | ICD-10-CM | POA: Diagnosis not present

## 2020-01-23 DIAGNOSIS — D731 Hypersplenism: Secondary | ICD-10-CM | POA: Diagnosis not present

## 2020-01-23 LAB — COMPREHENSIVE METABOLIC PANEL
ALT: 14 U/L (ref 0–44)
AST: 27 U/L (ref 15–41)
Albumin: 3.8 g/dL (ref 3.5–5.0)
Alkaline Phosphatase: 59 U/L (ref 38–126)
Anion gap: 8 (ref 5–15)
BUN: 11 mg/dL (ref 8–23)
CO2: 29 mmol/L (ref 22–32)
Calcium: 9.2 mg/dL (ref 8.9–10.3)
Chloride: 93 mmol/L — ABNORMAL LOW (ref 98–111)
Creatinine, Ser: 0.92 mg/dL (ref 0.44–1.00)
GFR calc Af Amer: >60 mL/min (ref >60)
GFR calc non Af Amer: 59 mL/min — ABNORMAL LOW (ref >60)
Glucose, Bld: 136 mg/dL — ABNORMAL HIGH (ref 70–99)
Potassium: 4.3 mmol/L (ref 3.5–5.1)
Sodium: 130 mmol/L — ABNORMAL LOW (ref 135–145)
Total Bilirubin: 1.4 mg/dL — ABNORMAL HIGH (ref 0.3–1.2)
Total Protein: 7.1 g/dL (ref 6.5–8.1)

## 2020-01-23 LAB — CBC WITH DIFFERENTIAL/PLATELET
Abs Immature Granulocytes: 0.01 10*3/uL (ref 0.00–0.07)
Basophils Absolute: 0 10*3/uL (ref 0.0–0.1)
Basophils Relative: 0 %
Eosinophils Absolute: 0.1 10*3/uL (ref 0.0–0.5)
Eosinophils Relative: 4 %
HCT: 33.8 % — ABNORMAL LOW (ref 36.0–46.0)
Hemoglobin: 12.2 g/dL (ref 12.0–15.0)
Immature Granulocytes: 0 %
Lymphocytes Relative: 18 %
Lymphs Abs: 0.5 10*3/uL — ABNORMAL LOW (ref 0.7–4.0)
MCH: 31 pg (ref 26.0–34.0)
MCHC: 36.1 g/dL — ABNORMAL HIGH (ref 30.0–36.0)
MCV: 86 fL (ref 80.0–100.0)
Monocytes Absolute: 0.2 10*3/uL (ref 0.1–1.0)
Monocytes Relative: 9 %
Neutro Abs: 1.9 10*3/uL (ref 1.7–7.7)
Neutrophils Relative %: 69 %
Platelets: 53 10*3/uL — ABNORMAL LOW (ref 150–400)
RBC: 3.93 MIL/uL (ref 3.87–5.11)
RDW: 12.9 % (ref 11.5–15.5)
WBC: 2.7 10*3/uL — ABNORMAL LOW (ref 4.0–10.5)
nRBC: 0 % (ref 0.0–0.2)

## 2020-01-23 LAB — PROTIME-INR
INR: 1.2 (ref 0.8–1.2)
Prothrombin Time: 14.8 seconds (ref 11.4–15.2)

## 2020-01-23 LAB — APTT: aPTT: 29 seconds (ref 24–36)
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
## 2020-01-23 NOTE — Assessment & Plan Note (Addendum)
#   Thrombocytopenia secondary to hypersplenism from splenomegaly.  Platelets-53 ; trending down over the last 5 years, but overall - STABLE;  If platelets less than 30/spontaneous bleeding-would consider thrombopoietin stimulating agents.  # mild anemia/ leucopenia- sec to cirrhosis-no infections; asymtomatic STABLE.      # Cirrhosis/recent decompensation s/p paracentesis; declined TIPs; awaiting AFP.  Recommend US abdomen for [place] surveillance.  Ordered ultrasound of the abdomen.  # DISPOSITION: # US abdomen in 2 weeks-  #  follow up 6 months- MD/labs-CBC CMP PT PTT AFP-Dr.B

## 2020-01-24 LAB — AFP TUMOR MARKER: AFP, Serum, Tumor Marker: 2.8 ng/mL (ref 0.0–8.3)

## 2020-01-28 ENCOUNTER — Other Ambulatory Visit: Payer: Self-pay | Admitting: Physician Assistant

## 2020-01-28 DIAGNOSIS — E1142 Type 2 diabetes mellitus with diabetic polyneuropathy: Secondary | ICD-10-CM

## 2020-01-28 MED ORDER — ACCU-CHEK AVIVA PLUS VI STRP: ORAL_STRIP | 3 refills | Status: DC

## 2020-01-28 NOTE — Progress Notes (Signed)
Diabetic strips refilled

## 2020-02-03 ENCOUNTER — Telehealth: Payer: Self-pay
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
## 2020-02-06 ENCOUNTER — Ambulatory Visit
Admission: RE | Admit: 2020-02-06 | Discharge: 2020-02-06 | Disposition: A | Discharge status: Home / Self Care | Payer: Medicare HMO | Source: Ambulatory Visit | Attending: Internal Medicine | Admitting: Internal Medicine

## 2020-02-06 ENCOUNTER — Other Ambulatory Visit: Payer: Self-pay

## 2020-02-06 DIAGNOSIS — K8020 Calculus of gallbladder without cholecystitis without obstruction: Secondary | ICD-10-CM | POA: Diagnosis not present

## 2020-02-06 DIAGNOSIS — K7460 Unspecified cirrhosis of liver: Secondary | ICD-10-CM | POA: Insufficient documentation

## 2020-02-06 DIAGNOSIS — R188 Other ascites: Secondary | ICD-10-CM | POA: Diagnosis not present

## 2020-02-06 DIAGNOSIS — K7581 Nonalcoholic steatohepatitis (NASH): Secondary | ICD-10-CM | POA: Insufficient documentation

## 2020-02-16 ENCOUNTER — Other Ambulatory Visit: Payer: Self-pay | Admitting: Physician Assistant

## 2020-02-16 DIAGNOSIS — M7989 Other specified soft tissue disorders: Secondary | ICD-10-CM

## 2020-02-26 ENCOUNTER — Other Ambulatory Visit: Payer: Self-pay | Admitting: Physician Assistant

## 2020-02-26 DIAGNOSIS — K219 Gastro-esophageal reflux disease without esophagitis: Secondary | ICD-10-CM

## 2020-03-04 ENCOUNTER — Other Ambulatory Visit: Payer: Self-pay | Admitting: Physician Assistant

## 2020-03-26 ENCOUNTER — Other Ambulatory Visit: Payer: Self-pay | Admitting: Physician Assistant

## 2020-03-26 DIAGNOSIS — E1142 Type 2 diabetes mellitus with diabetic polyneuropathy: Secondary | ICD-10-CM

## 2020-03-26 MED ORDER — ACCU-CHEK AVIVA PLUS W/DEVICE KIT: PACK | 0 refills | Status: AC

## 2020-03-26 NOTE — Progress Notes (Signed)
Diabetic meter refilled

## 2020-04-05 ENCOUNTER — Ambulatory Visit: Payer: Self-pay

## 2020-04-05 DIAGNOSIS — E118 Type 2 diabetes mellitus with unspecified complications: Secondary | ICD-10-CM

## 2020-04-05 DIAGNOSIS — I10 Essential (primary) hypertension: Secondary | ICD-10-CM

## 2020-04-05 NOTE — Chronic Care Management (AMB) (Signed)
Chronic Care Management Pharmacy  Name: Jennifer [name]  MRN: [id number] DOB: [birth year]  Chief Complaint/ HPI  Jennifer [name],  79 y.o. , female presents for their Follow-Up CCM visit with the clinical pharmacist via telephone.  PCP : Mar Daring, PA-C  Their chronic conditions include: Hypertension, Hyperlipidemia, Diabetes, GERD, and Allergic Rhinitis  Office Visits:  7/30 annual, [place], BP 105/69 P [address] 179.5 BMI 33.9, no diet/exercise, NASH Fe deficiency,    Consult Visit: 01/23/20: Patient presented to Dr. Rogue Bussing for liver cirrhosis. No medication changes made.  3/10 NASH cirrhosis, BP 128/75 P 67 BMI 33.0, easy bruising,   Medications: Outpatient Encounter Medications as of 04/05/2020  Medication Sig  . albuterol (VENTOLIN HFA) 108 (90 Base) MCG/ACT inhaler Inhale 1-2 puffs into the lungs every 6 (six) hours as needed for wheezing or shortness of breath.  . Blood Glucose Monitoring Suppl (ACCU-CHEK AVIVA PLUS) w/Device KIT To check blood sugar daily  . ergocalciferol (VITAMIN D2) 50000 units capsule Take 50,000 Units by mouth once a week. Every Friday when remembers  . FLUZONE HIGH-DOSE QUADRIVALENT 0.7 ML SUSY   . furosemide (LASIX) 20 MG tablet TAKE ONE TABLET EVERY DAY  . glucose blood (ACCU-CHEK AVIVA PLUS) test strip To check blood sugar daily  . Lancets (ACCU-CHEK SOFT TOUCH) lancets To check blood sugar daily  . Magnesium 200 MG TABS Take 1 tablet by mouth daily.  . nadolol (CORGARD) 20 MG tablet TAKE ONE-HALF TABLET BY MOUTH EVERY DAY  . pantoprazole (PROTONIX) 40 MG tablet TAKE ONE TABLET BY MOUTH TWICE DAILY  . spironolactone (ALDACTONE) 100 MG tablet TAKE ONE TABLET BY MOUTH EVERY DAY   No facility-administered encounter medications on file as of 04/05/2020.   Current Diagnosis/Assessment:  SDOH Interventions     Most Recent Value  SDOH Interventions  Financial Strain Interventions Intervention Not Indicated  Transportation Interventions  Intervention Not Indicated     Goals Addressed            This Visit's Progress   . Chronic Care Management       CARE PLAN ENTRY (see longitudinal plan of care for additional care plan information)  Current Barriers:  . Chronic Disease Management support, education, and care coordination needs related to Hypertension, Hyperlipidemia, Diabetes, GERD, and Allergic Rhinitis   Hypertension BP Readings from Last 3 Encounters:  12/05/19 105/69  07/16/19 128/75  01/15/19 130/66   . Pharmacist Clinical Goal(s): o Over the next 90 days, patient will work with PharmD and providers to maintain BP goal <140/90 . Current regimen:  o Nadolol 48m 1/2 tab twice daily o Spironolactone 1020mdaily o Furosemide 20 mg daily . Interventions: o None . Patient self care activities - Over the next 90 days, patient will: o Check BP weekly, document, and provide at future appointments o Ensure daily salt intake < 2300 mg/day  Hyperlipidemia Lab Results  Component Value Date/Time   LDLCALC 111 (H) 12/05/2019 08:56 AM   . Pharmacist Clinical Goal(s): o Over the next 90 days, patient will work with PharmD and providers to achieve LDL goal < 100 . Current regimen:  o None . Interventions: o New statin start age > 754ot evidence based, patient declined . Patient self care activities - Over the next 90 days, patient will: o Continue to improve diet and exercise  Diabetes Lab Results  Component Value Date/Time   HGBA1C 6.8 (H) 12/05/2019 08:56 AM   HGBA1C 6.4 (H) 11/27/2018 10:55 AM   .  Pharmacist Clinical Goal(s): o Over the next 90 days, patient will work with PharmD and providers to maintain A1c goal <7% . Current regimen:  o None . Interventions: o None . Patient self care activities - Over the next 90 days, patient will: o Check blood sugar once daily, document, and provide at future appointments o Contact provider with any episodes of hypoglycemia o Continue lifestyle  improvements  Medication management . Pharmacist Clinical Goal(s): o Over the next 90 days, patient will work with PharmD and providers to maintain optimal medication adherence . Current pharmacy: [facility] . Interventions o Comprehensive medication review performed. o Continue current medication management strategy . Patient self care activities - Over the next 90 days, patient will: o Focus on medication adherence by getting medications delivered if helpful o Take medications as prescribed o Report any questions or concerns to PharmD and/or provider(s)      Diabetes   Recent Relevant Labs: Lab Results  Component Value Date/Time   HGBA1C 6.8 (H) 12/05/2019 08:56 AM   HGBA1C 6.4 (H) 11/27/2018 10:55 AM   MICROALBUR 20 08/30/2017 10:11 AM   MICROALBUR 20 07/27/2016 10:17 AM     Checking BG: Daily  Recent pre-meal BG readings: 106, 108  Patient has failed these meds in past: metformin Patient is currently controlled on the following medications: None  Last diabetic Foot exam:  Lab Results  Component Value Date/Time   HMDIABEYEEXA Retinopathy (A) 12/15/2019 12:00 AM    Last diabetic Eye exam:  Lab Results  Component Value Date/Time   HMDIABFOOTEX normal 07/13/2014 12:00 AM    We discussed: Diet and Exercise extensively.  Patient's glucometer is not working, she has been checking her blood sugar using her son's meter. She cannot get a new meter through her insurance until December.  Plan  Continue control with diet and exercise   Hypertension   BP goal is:  <140/90  Office blood pressures are  BP Readings from Last 3 Encounters:  01/23/20 128/60  12/05/19 105/69  07/16/19 128/75   Patient checks BP at home daily Patient home BP readings are ranging: 100/60   Patient has failed these meds in the past: NA Patient is currently controlled on the following medications:   . Nadolol 20 mg 1/2 tablet daily  . Furosemide 20 mg daily  . Spironolactone 179m  daily  We discussed: Does have sporadic dizziness, but they are mild and resolve quickly. Does not think it is related to hypotension or her blood pressure medications.   Plan  Continue current medications   Hyperlipidemia   LDL goal < 100   Lipid Panel     Component Value Date/Time   CHOL 178 12/05/2019 0856   TRIG 68 12/05/2019 0856   HDL 54 12/05/2019 0856   LDLCALC 111 (H) 12/05/2019 0856    Hepatic Function Latest Ref Rng & Units 01/23/2020 12/05/2019 07/16/2019  Total Protein 6.5 - 8.1 g/dL 7.1 6.5 6.9  Albumin 3.5 - 5.0 g/dL 3.8 3.8 3.9  AST 15 - 41 U/L 27 23 30   ALT 0 - 44 U/L 14 10 17   Alk Phosphatase 38 - 126 U/L 59 73 58  Total Bilirubin 0.3 - 1.2 mg/dL 1.4(H) 1.3(H) 1.5(H)     The ASCVD Risk score (Mikey BussingDC Jr., et al., 2013) failed to calculate for the following reasons:   The 2013 ASCVD risk score is only valid for ages 44to 798  Patient has failed these meds in past: NA Patient is  currently uncontrolled on the following medications:  . None  We discussed: dietary habits and holiday eating strategies.   Plan  Continue control with diet and exercise  GERD   History of Esophagitis   Patient denies dysphagia, heartburn or nausea. Expresses understanding to avoid triggers such as citrus juices, fatty foods and large meals.  Currently controlled on: . Pantoprazole 40 mg twice daily   Plan   Continue current medication.  Vaccines   Reviewed and discussed patient's vaccination history.    Immunization History  Administered Date(s) Administered  . Hepatitis A 07/10/2011, 12/25/2011  . Hepatitis B 07/10/2011, 08/11/2011, 12/25/2011  . Influenza Split 02/24/2010, 03/27/2012  . Influenza, High Dose Seasonal PF 01/18/2015, 02/19/2016, 02/05/2017, 03/04/2018, 01/22/2019  . Influenza-Unspecified 01/22/2019  . Moderna SARS-COVID-2 Vaccination 07/03/2019, 07/31/2019, 03/16/2020  . Pneumococcal Conjugate-13 07/13/2014  . Pneumococcal Polysaccharide-23  01/27/2009  . Td 04/08/1996  . Tdap 02/24/2010, 06/17/2018   Plan  Recommended patient receive Shingrix Vaccine.    Medication Management   Pt uses [facility] pharmacy for all medications Uses pill box? Yes Pt endorses 100% compliance  We discussed:   Plan  Continue current medication management strategy  Follow up: 3 month phone visit  [place] [phone]

## 2020-04-05 NOTE — Patient Instructions (Signed)
Visit Information It was great speaking with you today!  Please let me know if you have any questions about our visit. Goals Addressed            This Visit's Progress   . Chronic Care Management       CARE PLAN ENTRY (see longitudinal plan of care for additional care plan information)  Current Barriers:  . Chronic Disease Management support, education, and care coordination needs related to Hypertension, Hyperlipidemia, Diabetes, GERD, and Allergic Rhinitis   Hypertension BP Readings from Last 3 Encounters:  12/05/19 105/69  07/16/19 128/75  01/15/19 130/66   . Pharmacist Clinical Goal(s): o Over the next 90 days, patient will work with PharmD and providers to maintain BP goal <140/90 . Current regimen:  o Nadolol 58m 1/2 tab twice daily o Spironolactone 1026mdaily o Furosemide 20 mg daily . Interventions: o None . Patient self care activities - Over the next 90 days, patient will: o Check BP weekly, document, and provide at future appointments o Ensure daily salt intake < 2300 mg/day  Hyperlipidemia Lab Results  Component Value Date/Time   LDLCALC 111 (H) 12/05/2019 08:56 AM   . Pharmacist Clinical Goal(s): o Over the next 90 days, patient will work with PharmD and providers to achieve LDL goal < 100 . Current regimen:  o None . Interventions: o New statin start age > 7540ot evidence based, patient declined . Patient self care activities - Over the next 90 days, patient will: o Continue to improve diet and exercise  Diabetes Lab Results  Component Value Date/Time   HGBA1C 6.8 (H) 12/05/2019 08:56 AM   HGBA1C 6.4 (H) 11/27/2018 10:55 AM   . Pharmacist Clinical Goal(s): o Over the next 90 days, patient will work with PharmD and providers to maintain A1c goal <7% . Current regimen:  o None . Interventions: o None . Patient self care activities - Over the next 90 days, patient will: o Check blood sugar once daily, document, and provide at future  appointments o Contact provider with any episodes of hypoglycemia o Continue lifestyle improvements  Medication management . Pharmacist Clinical Goal(s): o Over the next 90 days, patient will work with PharmD and providers to maintain optimal medication adherence . Current pharmacy: [facility] . Interventions o Comprehensive medication review performed. o Continue current medication management strategy . Patient self care activities - Over the next 90 days, patient will: o Focus on medication adherence by getting medications delivered if helpful o Take medications as prescribed o Report any questions or concerns to PharmD and/or provider(s)       The patient verbalized understanding of instructions, educational materials, and care plan provided today and declined offer to receive copy of patient instructions, educational materials, and care plan.   Telephone follow up appointment with pharmacy team member scheduled for: 03/14/21 at 1:00 PM  [phone]

## 2020-04-13 DIAGNOSIS — L821 Other seborrheic keratosis: Secondary | ICD-10-CM | POA: Diagnosis not present

## 2020-04-13 DIAGNOSIS — L538 Other specified erythematous conditions: Secondary | ICD-10-CM | POA: Diagnosis not present

## 2020-04-13 DIAGNOSIS — L820 Inflamed seborrheic keratosis: Secondary | ICD-10-CM | POA: Diagnosis not present

## 2020-04-23 ENCOUNTER — Encounter: Payer: Self-pay | Admitting: Family Medicine

## 2020-04-23 ENCOUNTER — Other Ambulatory Visit: Payer: Self-pay

## 2020-04-23 ENCOUNTER — Ambulatory Visit (INDEPENDENT_AMBULATORY_CARE_PROVIDER_SITE_OTHER): Payer: Medicare HMO | Admitting: Family Medicine

## 2020-04-23 ENCOUNTER — Ambulatory Visit: Payer: Medicare HMO | Admitting: Family Medicine

## 2020-04-23 ENCOUNTER — Emergency Department
Admission: EM | Admit: 2020-04-23 | Discharge: 2020-04-23 | Disposition: A | Discharge status: Home / Self Care | Payer: Medicare HMO | Attending: Emergency Medicine | Admitting: Emergency Medicine

## 2020-04-23 ENCOUNTER — Ambulatory Visit: Payer: Self-pay

## 2020-04-23 VITALS — BP 131/67 | HR 63 | Temp 97.5°F | Wt 179.0 lb

## 2020-04-23 DIAGNOSIS — R1011 Right upper quadrant pain: Secondary | ICD-10-CM

## 2020-04-23 DIAGNOSIS — J45909 Unspecified asthma, uncomplicated: Secondary | ICD-10-CM | POA: Diagnosis not present

## 2020-04-23 DIAGNOSIS — E1169 Type 2 diabetes mellitus with other specified complication: Secondary | ICD-10-CM | POA: Diagnosis not present

## 2020-04-23 DIAGNOSIS — R1084 Generalized abdominal pain: Secondary | ICD-10-CM | POA: Diagnosis not present

## 2020-04-23 DIAGNOSIS — I10 Essential (primary) hypertension: Secondary | ICD-10-CM | POA: Diagnosis not present

## 2020-04-23 DIAGNOSIS — E785 Hyperlipidemia, unspecified: Secondary | ICD-10-CM | POA: Diagnosis not present

## 2020-04-23 DIAGNOSIS — K7581 Nonalcoholic steatohepatitis (NASH): Secondary | ICD-10-CM | POA: Diagnosis not present

## 2020-04-23 DIAGNOSIS — R1010 Upper abdominal pain, unspecified: Secondary | ICD-10-CM

## 2020-04-23 DIAGNOSIS — Z86010 Personal history of colonic polyps: Secondary | ICD-10-CM | POA: Diagnosis not present

## 2020-04-23 DIAGNOSIS — Z87891 Personal history of nicotine dependence: Secondary | ICD-10-CM | POA: Insufficient documentation

## 2020-04-23 DIAGNOSIS — K7460 Unspecified cirrhosis of liver: Secondary | ICD-10-CM | POA: Diagnosis not present

## 2020-04-23 DIAGNOSIS — E871 Hypo-osmolality and hyponatremia: Secondary | ICD-10-CM

## 2020-04-23 DIAGNOSIS — R52 Pain, unspecified: Secondary | ICD-10-CM | POA: Diagnosis not present

## 2020-04-23 LAB — COMPREHENSIVE METABOLIC PANEL
ALT: 14 U/L (ref 0–44)
AST: 34 U/L (ref 15–41)
Albumin: 3.7 g/dL (ref 3.5–5.0)
Alkaline Phosphatase: 81 U/L (ref 38–126)
Anion gap: 9 (ref 5–15)
BUN: 14 mg/dL (ref 8–23)
CO2: 27 mmol/L (ref 22–32)
Calcium: 9.4 mg/dL (ref 8.9–10.3)
Chloride: 91 mmol/L — ABNORMAL LOW (ref 98–111)
Creatinine, Ser: 0.86 mg/dL (ref 0.44–1.00)
GFR, Estimated: >60 mL/min (ref >60)
Glucose, Bld: 188 mg/dL — ABNORMAL HIGH (ref 70–99)
Potassium: 4 mmol/L (ref 3.5–5.1)
Sodium: 127 mmol/L — ABNORMAL LOW (ref 135–145)
Total Bilirubin: 1.9 mg/dL — ABNORMAL HIGH (ref 0.3–1.2)
Total Protein: 7.4 g/dL (ref 6.5–8.1)

## 2020-04-23 LAB — CBC
HCT: 38.4 % (ref 36.0–46.0)
Hemoglobin: 13.3 g/dL (ref 12.0–15.0)
MCH: 30.5 pg (ref 26.0–34.0)
MCHC: 34.6 g/dL (ref 30.0–36.0)
MCV: 88.1 fL (ref 80.0–100.0)
Platelets: 53 10*3/uL — ABNORMAL LOW (ref 150–400)
RBC: 4.36 MIL/uL (ref 3.87–5.11)
RDW: 13.8 % (ref 11.5–15.5)
WBC: 6.4 10*3/uL (ref 4.0–10.5)
nRBC: 0 % (ref 0.0–0.2)

## 2020-04-23 LAB — LIPASE, BLOOD: Lipase: 35 U/L (ref 11–51)

## 2020-04-23 NOTE — ED Notes (Signed)
ED Provider at bedside. 

## 2020-04-23 NOTE — Telephone Encounter (Signed)
Pt. Reports she started having abdominal pain last night. Called EMS . "They checked me out and everything was good." Pain 5-6/10. No diarrhea or constipation. Appointment made for today.  Reason for Disposition . [1] MILD-MODERATE pain AND [2] constant AND [3] present > 2 hours  Answer Assessment - Initial Assessment Questions 1. LOCATION: "Where does it hurt?"      Above belly button 2. RADIATION: "Does the pain shoot anywhere else?" (e.g., chest, back)     No 3. ONSET: "When did the pain begin?" (e.g., minutes, hours or days ago)      Last night 4. SUDDEN: "Gradual or sudden onset?"     Sudden 5. PATTERN "Does the pain come and go, or is it constant?"    - If constant: "Is it getting better, staying the same, or worsening?"      (Note: Constant means the pain never goes away completely; most serious pain is constant and it progresses)     - If intermittent: "How long does it last?" "Do you have pain now?"     (Note: Intermittent means the pain goes away completely between bouts)     Constant 6. SEVERITY: "How bad is the pain?"  (e.g., Scale 1-10; mild, moderate, or severe)   - MILD (1-3): doesn't interfere with normal activities, abdomen soft and not tender to touch    - MODERATE (4-7): interferes with normal activities or awakens from sleep, tender to touch    - SEVERE (8-10): excruciating pain, doubled over, unable to do any normal activities      5-6 7. RECURRENT SYMPTOM: "Have you ever had this type of stomach pain before?" If Yes, ask: "When was the last time?" and "What happened that time?"      No 8. CAUSE: "What do you think is causing the stomach pain?"     Unsure 9. RELIEVING/AGGRAVATING FACTORS: "What makes it better or worse?" (e.g., movement, antacids, bowel movement)     No 10. OTHER SYMPTOMS: "Has there been any vomiting, diarrhea, constipation, or urine problems?"       No 11. PREGNANCY: "Is there any chance you are pregnant?" "When was your last menstrual  period?"       No  Protocols used: ABDOMINAL PAIN - [facility]

## 2020-04-23 NOTE — Progress Notes (Signed)
Acute Office Visit  Subjective:    Patient ID: Jennifer [name], female    DOB: 09-29-1940, 79 y.o.   MRN: [id number]  No chief complaint on file.   HPI Patient is in today for abdominal pain.  She states she was fine yesterday until about 10:30 last night when she began having abdominal pain.  She denies any associated symptoms.  No fever, nausea, vomiting, diarrhea or constipation.   She reports it hurt to the point she was unable to rest at all last night.  This morning she had rescue at her house and they checked on her and said her vital signs were good and her EKG was good.  They recommended she go to either the ER or see her PCP.    Past Medical History:  Diagnosis Date   Allergy    Arthritis    Asthma    Colon polyp    Fatty liver    GERD (gastroesophageal reflux disease)    Hyperlipidemia    Hypertension    Motion sickness    boats    Past Surgical History:  Procedure Laterality Date   ABDOMINAL HYSTERECTOMY  1978   COLONOSCOPY WITH PROPOFOL N/A 02/01/2015   Procedure: COLONOSCOPY WITH PROPOFOL;  Surgeon: Lucilla Lame, MD;  Location: [place];  Service: Endoscopy;  Laterality: N/A;  Diabetic - oral meds   COLONOSCOPY WITH PROPOFOL N/A 11/22/2017   Procedure: COLONOSCOPY WITH PROPOFOL;  Surgeon: Lin Landsman, MD;  Location[facility] ENDOSCOPY;  Service: Gastroenterology;  Laterality: N/A;   ESOPHAGOGASTRODUODENOSCOPY (EGD) WITH PROPOFOL N/A 07/12/2017   Procedure: ESOPHAGOGASTRODUODENOSCOPY (EGD) WITH PROPOFOL;  Surgeon: Lin Landsman, MD;  Location: [facility] ENDOSCOPY;  Service: Gastroenterology;  Laterality: N/A;   ESOPHAGOGASTRODUODENOSCOPY (EGD) WITH PROPOFOL N/A 08/16/2017   Procedure: ESOPHAGOGASTRODUODENOSCOPY (EGD) WITH PROPOFOL;  Surgeon: Lin Landsman, MD;  Location: [facility] ENDOSCOPY;  Service: Gastroenterology;  Laterality: N/A;   ESOPHAGOGASTRODUODENOSCOPY (EGD) WITH PROPOFOL N/A 09/20/2017   Procedure:  ESOPHAGOGASTRODUODENOSCOPY (EGD) WITH PROPOFOL;  Surgeon: Lin Landsman, MD;  Location: [facility] ENDOSCOPY;  Service: Gastroenterology;  Laterality: N/A;   ESOPHAGOGASTRODUODENOSCOPY (EGD) WITH PROPOFOL N/A 11/01/2017   Procedure: ESOPHAGOGASTRODUODENOSCOPY (EGD) WITH PROPOFOL;  Surgeon: Lin Landsman, MD;  Location: [facility] ENDOSCOPY;  Service: Gastroenterology;  Laterality: N/A;   ESOPHAGOGASTRODUODENOSCOPY (EGD) WITH PROPOFOL N/A 01/17/2018   Procedure: ESOPHAGOGASTRODUODENOSCOPY (EGD) WITH PROPOFOL;  Surgeon: Lin Landsman, MD;  Location: [facility] ENDOSCOPY;  Service: Gastroenterology;  Laterality: N/A;   ESOPHAGOGASTRODUODENOSCOPY (EGD) WITH PROPOFOL N/A 06/20/2018   Procedure: ESOPHAGOGASTRODUODENOSCOPY (EGD) WITH PROPOFOL;  Surgeon: Lin Landsman, MD;  Location: [place];  Service: Gastroenterology;  Laterality: N/A;  EGD/ with banding ligation    IR RADIOLOGIST EVAL & MGMT  07/09/2018   POLYPECTOMY  02/01/2015   Procedure: POLYPECTOMY;  Surgeon: Lucilla Lame, MD;  Location: [place];  Service: Endoscopy;;   TONSILLECTOMY AND ADENOIDECTOMY      Family History  Problem Relation Age of Onset   Dementia Mother    Alcohol abuse Father    Cancer Sister        lung   Lymphoma Sister    Melanoma Sister    Lymphoma Sister     Social History   Socioeconomic History   Marital status: Widowed    Spouse name: Not on file   Number of children: 2   Years of education: Not on file   Highest education level: Some college, no degree  Occupational History   Occupation: retired  Tobacco Use  Smoking status: Former Smoker    Packs/day: 0.50    Years: 6.00    Pack years: 3.00    Types: Cigarettes    Quit date: 05/08/1987    Years since quitting: 32.9   Smokeless tobacco: Never Used  Vaping Use   Vaping Use: Never used  Substance and Sexual Activity   Alcohol use: Never   Drug use: Never   Sexual activity: Not on file  Other Topics  Concern   Not on file  Social History Narrative   Not on file   Social Determinants of Health   Financial Resource Strain: Low Risk    Difficulty of Paying Living Expenses: Not hard at all  Food Insecurity: No Food Insecurity   Worried About Charity fundraiser in the Last Year: Never true   [place] in the Last Year: Never true  Transportation Needs: No Transportation Needs   Lack of Transportation (Medical): No   Lack of Transportation (Non-Medical): No  Physical Activity: Inactive   Days of Exercise per Week: 0 days   Minutes of Exercise per Session: 0 min  Stress: No Stress Concern Present   Feeling of Stress : Not at all  Social Connections: Moderately Isolated   Frequency of Communication with Friends and Family: More than three times a week   Frequency of Social Gatherings with Friends and Family: More than three times a week   Attends Religious Services: More than 4 times per year   Active Member of [employer] or Organizations: No   Attends Archivist Meetings: Never   Marital Status: Widowed  Human resources officer Violence: Not At Risk   Fear of Current or Ex-Partner: No   Emotionally Abused: No   Physically Abused: No   Sexually Abused: No    Outpatient Medications Prior to Visit  Medication Sig Dispense Refill   albuterol (VENTOLIN HFA) 108 (90 Base) MCG/ACT inhaler Inhale 1-2 puffs into the lungs every 6 (six) hours as needed for wheezing or shortness of breath. 18 g 1   Blood Glucose Monitoring Suppl (ACCU-CHEK AVIVA PLUS) w/Device KIT To check blood sugar daily 1 kit 0   ergocalciferol (VITAMIN D2) 50000 units capsule Take 50,000 Units by mouth once a week. Every Friday when remembers     FLUZONE HIGH-DOSE QUADRIVALENT 0.7 ML SUSY      furosemide (LASIX) 20 MG tablet TAKE ONE TABLET EVERY DAY 90 tablet 1   glucose blood (ACCU-CHEK AVIVA PLUS) test strip To check blood sugar daily 100 each 3   Lancets (ACCU-CHEK SOFT TOUCH)  lancets To check blood sugar daily 100 each 12   Magnesium 200 MG TABS Take 1 tablet by mouth daily.     nadolol (CORGARD) 20 MG tablet TAKE ONE-HALF TABLET BY MOUTH EVERY DAY 45 tablet 3   pantoprazole (PROTONIX) 40 MG tablet TAKE ONE TABLET BY MOUTH TWICE DAILY 180 tablet 0   spironolactone (ALDACTONE) 100 MG tablet TAKE ONE TABLET BY MOUTH EVERY DAY 90 tablet 1   No facility-administered medications prior to visit.    Allergies  Allergen Reactions   Amoxicillin Other (See Comments)    Yeast infections   Codeine Other (See Comments)    Pt denies   Hydrocodone-Acetaminophen Other (See Comments)    Pt denies   Nitrofurantoin Other (See Comments)   Nitrofurantoin Monohyd Macro     "Sugar got high"   Penicillins     Has patient had a PCN reaction causing immediate rash, facial/tongue/throat swelling,  SOB or lightheadedness with hypotension: Unknown Has patient had a PCN reaction causing severe rash involving mucus membranes or skin necrosis: Unknown Has patient had a PCN reaction that required hospitalization: Unknown Has patient had a PCN reaction occurring within the last 10 years: Unknown If all of the above answers are "NO", then may proceed with Cephalosporin use.   Sulfa Antibiotics Other (See Comments)    Review of Systems  Constitutional: Positive for fatigue and fever.  Gastrointestinal: Positive for abdominal distention and abdominal pain. Negative for blood in stool, constipation, diarrhea, nausea and vomiting.  Genitourinary: Negative for difficulty urinating and dysuria.       Objective:    Physical Exam Constitutional:      General: She is not in acute distress.    Appearance: She is well-developed and well-nourished.  HENT:     Head: Normocephalic and atraumatic.     Right Ear: Hearing normal.     Left Ear: Hearing normal.     Nose: Nose normal.  Eyes:     General: Lids are normal. No scleral icterus.       Right eye: No discharge.        Left  eye: No discharge.     Conjunctiva/sclera: Conjunctivae normal.  Cardiovascular:     Rate and Rhythm: Regular rhythm.     Heart sounds: Normal heart sounds.  Pulmonary:     Effort: Pulmonary effort is normal. No respiratory distress.     Breath sounds: Normal breath sounds.  Abdominal:     General: There is distension.     Tenderness: There is abdominal tenderness. There is guarding and rebound.  Musculoskeletal:        General: Normal range of motion.     Cervical back: Neck supple.  Skin:    General: Skin is intact.     Findings: No lesion or rash.  Neurological:     Mental Status: She is alert and oriented to person, place, and time.  Psychiatric:        Mood and Affect: Mood and affect normal.        Speech: Speech normal.        Behavior: Behavior normal.        Thought Content: Thought content normal.     BP 131/67 (BP Location: Right Arm, Patient Position: Sitting, Cuff Size: Normal)    Pulse 63    Temp (!) 97.5 F (36.4 C) (Oral)    Wt 179 lb (81.2 kg)    SpO2 99%    BMI 33.82 kg/m  Wt Readings from Last 3 Encounters:  04/23/20 179 lb (81.2 kg)  01/23/20 175 lb 9.6 oz (79.7 kg)  12/05/19 179 lb 9.6 oz (81.5 kg)    Health Maintenance Due  Topic Date Due   URINE MICROALBUMIN  08/31/2018   INFLUENZA VACCINE  12/07/2019   DEXA SCAN  01/27/2020    There are no preventive care reminders to display for this patient.   Lab Results  Component Value Date   TSH 1.280 12/05/2019   Lab Results  Component Value Date   WBC 2.7 (L) 01/23/2020   HGB 12.2 01/23/2020   HCT 33.8 (L) 01/23/2020   MCV 86.0 01/23/2020   PLT 53 (L) 01/23/2020   Lab Results  Component Value Date   NA 130 (L) 01/23/2020   K 4.3 01/23/2020   CO2 29 01/23/2020   GLUCOSE 136 (H) 01/23/2020   BUN 11 01/23/2020   CREATININE 0.92 01/23/2020  BILITOT 1.4 (H) 01/23/2020   ALKPHOS 59 01/23/2020   AST 27 01/23/2020   ALT 14 01/23/2020   PROT 7.1 01/23/2020   ALBUMIN 3.8 01/23/2020    CALCIUM 9.2 01/23/2020   ANIONGAP 8 01/23/2020   Lab Results  Component Value Date   CHOL 178 12/05/2019   Lab Results  Component Value Date   HDL 54 12/05/2019   Lab Results  Component Value Date   LDLCALC 111 (H) 12/05/2019   Lab Results  Component Value Date   TRIG 68 12/05/2019   Lab Results  Component Value Date   CHOLHDL 3.3 12/05/2019   Lab Results  Component Value Date   HGBA1C 6.8 (H) 12/05/2019       Assessment & Plan:   1. Acute upper abdominal pain Ate some beef with mash potatoes the a snack of cheese with [place] yesterday evening. An hour later, she began having upper abdominal pain with severe nausea and bloating across the upper abdomen. States she is unable to vomit. Has a history of a 1.3 cm gallstone on sonogram on 02-06-20. Sent to ER immediately for suspected acute cholelithiasis with obstruction. Son to drive her there, now.  2. Liver cirrhosis secondary to NASH (HCC) Thrombocytopenia followed by Dr. Rogue Bussing. Some ascites in the past. Evaluated by Dr. Marius Ditch in the past.    No orders of the defined types were placed in this encounter.    Juluis Mire, CMA

## 2020-04-23 NOTE — ED Triage Notes (Signed)
Pt here via POV w/son from home.  Pt here with complains of lower abdominal pain that started about 1030pm last night. Pt also complains of nausea, no vomiting, no diarrhea. Pt reports she feels like she has been dehydrated, unsure if fevers at home.

## 2020-04-26 ENCOUNTER — Telehealth: Payer: Self-pay

## 2020-04-26 NOTE — Telephone Encounter (Signed)
Spoke with patient-she states she is having abdominal pain and is requesting something for pain-instructed patient to take ibuprofen and tylenol and she will try this if no relief she will call back- denies fever. Denies nausea and vomiting. The pain is  under her right breast-her urine is very dark yellow- she was instructed to increase water until urine is clear- bowel movements-normal.

## 2020-04-26 NOTE — Telephone Encounter (Signed)
Patient requested to be seen earlier than 3 weeks due to pain and not feeling well.

## 2020-04-27 NOTE — ED Provider Notes (Signed)
[facility] Emergency Department Provider Note   ____________________________________________   Event Date/Time   First MD Initiated Contact with Patient 04/23/20 1358     (approximate)  I have reviewed the triage vital signs and the nursing notes.   HISTORY  Chief Complaint Abdominal Pain    HPI Jennifer [name] is a 79 y.o. female stated past medical history of GERD, hyperlipidemia, hypertension, and asthma who presents for right upper quadrant abdominal pain that has been intermittent over the last 2 days.  Patient denies any current pain at this time.  Patient states that it is 6/10, nonradiating, right upper quadrant abdominal pain that is worsened with p.o. intake.  Patient denies any history of cholelithiasis.  Patient denies any fevers but does endorse an episode of nausea/vomiting.  Patient currently denies any vision changes, tinnitus, difficulty speaking, facial droop, sore throat, chest pain, shortness of breath, diarrhea, dysuria, or weakness/numbness/paresthesias in any extremity         Past Medical History:  Diagnosis Date   Allergy    Arthritis    Asthma    Colon polyp    Fatty liver    GERD (gastroesophageal reflux disease)    Hyperlipidemia    Hypertension    Motion sickness    boats    Patient Active Problem List   Diagnosis Date Noted   Essential hypertension, benign 02/04/2018   Iron deficiency 02/04/2018   Pelvic fracture ([place]) 01/31/2018   Multiple gastric polyps    Hx of colonic polyps    Acute upper GI bleed 07/12/2017   Liver cirrhosis secondary to NASH ([place])    Carpal tunnel syndrome on right 09/28/2016   Obesity (BMI 30.0-34.9) 09/20/2016   Upper respiratory infection 03/17/2016   Thrombocytopenia ([place]) 12/29/2015   Benign neoplasm of ascending colon    Benign neoplasm of transverse colon    Benign neoplasm of sigmoid colon    Allergic rhinitis 10/14/2014   Airway hyperreactivity  10/14/2014   Type 2 diabetes mellitus with unspecified complications ([place]) 93/90/3009   Calculus of gallbladder 10/14/2014   Esophagitis, reflux 10/14/2014   Vitamin D deficiency 10/14/2014    Past Surgical History:  Procedure Laterality Date   ABDOMINAL HYSTERECTOMY  1978   COLONOSCOPY WITH PROPOFOL N/A 02/01/2015   Procedure: COLONOSCOPY WITH PROPOFOL;  Surgeon: Lucilla Lame, MD;  Location: [place];  Service: Endoscopy;  Laterality: N/A;  Diabetic - oral meds   COLONOSCOPY WITH PROPOFOL N/A 11/22/2017   Procedure: COLONOSCOPY WITH PROPOFOL;  Surgeon: Lin Landsman, MD;  Location: [facility] ENDOSCOPY;  Service: Gastroenterology;  Laterality: N/A;   ESOPHAGOGASTRODUODENOSCOPY (EGD) WITH PROPOFOL N/A 07/12/2017   Procedure: ESOPHAGOGASTRODUODENOSCOPY (EGD) WITH PROPOFOL;  Surgeon: Lin Landsman, MD;  Location: [facility] ENDOSCOPY;  Service: Gastroenterology;  Laterality: N/A;   ESOPHAGOGASTRODUODENOSCOPY (EGD) WITH PROPOFOL N/A 08/16/2017   Procedure: ESOPHAGOGASTRODUODENOSCOPY (EGD) WITH PROPOFOL;  Surgeon: Lin Landsman, MD;  Location: [facility] ENDOSCOPY;  Service: Gastroenterology;  Laterality: N/A;   ESOPHAGOGASTRODUODENOSCOPY (EGD) WITH PROPOFOL N/A 09/20/2017   Procedure: ESOPHAGOGASTRODUODENOSCOPY (EGD) WITH PROPOFOL;  Surgeon: Lin Landsman, MD;  Location: [facility] ENDOSCOPY;  Service: Gastroenterology;  Laterality: N/A;   ESOPHAGOGASTRODUODENOSCOPY (EGD) WITH PROPOFOL N/A 11/01/2017   Procedure: ESOPHAGOGASTRODUODENOSCOPY (EGD) WITH PROPOFOL;  Surgeon: Lin Landsman, MD;  Location: [facility] ENDOSCOPY;  Service: Gastroenterology;  Laterality: N/A;   ESOPHAGOGASTRODUODENOSCOPY (EGD) WITH PROPOFOL N/A 01/17/2018   Procedure: ESOPHAGOGASTRODUODENOSCOPY (EGD) WITH PROPOFOL;  Surgeon: Lin Landsman, MD;  Location: [facility] ENDOSCOPY;  Service: Gastroenterology;  Laterality: N/A;   ESOPHAGOGASTRODUODENOSCOPY (EGD) WITH PROPOFOL N/A 06/20/2018   Procedure:  ESOPHAGOGASTRODUODENOSCOPY (EGD) WITH PROPOFOL;  Surgeon: Lin Landsman, MD;  Location: [place];  Service: Gastroenterology;  Laterality: N/A;  EGD/ with banding ligation    IR RADIOLOGIST EVAL & MGMT  07/09/2018   POLYPECTOMY  02/01/2015   Procedure: POLYPECTOMY;  Surgeon: Lucilla Lame, MD;  Location: [place];  Service: Endoscopy;;   TONSILLECTOMY AND ADENOIDECTOMY      Prior to Admission medications   Medication Sig Start Date End Date Taking? Authorizing Provider  albuterol (VENTOLIN HFA) 108 (90 Base) MCG/ACT inhaler Inhale 1-2 puffs into the lungs every 6 (six) hours as needed for wheezing or shortness of breath. 12/05/19   Mar Daring, PA-C  Blood Glucose Monitoring Suppl (ACCU-CHEK AVIVA PLUS) w/Device KIT To check blood sugar daily 03/26/20   Mar Daring, PA-C  ergocalciferol (VITAMIN D2) 50000 units capsule Take 50,000 Units by mouth once a week. Every Friday when remembers 02/01/18   [provider]  FLUZONE HIGH-DOSE QUADRIVALENT 0.7 ML SUSY  01/22/19   [provider]  furosemide (LASIX) 20 MG tablet TAKE ONE TABLET EVERY DAY 02/16/20   Mar Daring, PA-C  glucose blood (ACCU-CHEK AVIVA PLUS) test strip To check blood sugar daily 01/28/20   Mar Daring, PA-C  Lancets (ACCU-CHEK SOFT TOUCH) lancets To check blood sugar daily 12/12/18   Mar Daring, PA-C  Magnesium 200 MG TABS Take 1 tablet by mouth daily.    [provider]  nadolol (CORGARD) 20 MG tablet TAKE ONE-HALF TABLET BY MOUTH EVERY DAY 10/07/19   Mar Daring, PA-C  pantoprazole (PROTONIX) 40 MG tablet TAKE ONE TABLET BY MOUTH TWICE DAILY 02/26/20   Mar Daring, PA-C  spironolactone (ALDACTONE) 100 MG tablet TAKE ONE TABLET BY MOUTH EVERY DAY 03/04/20   Mar Daring, PA-C    Allergies Amoxicillin, Codeine, Hydrocodone-acetaminophen, Nitrofurantoin, Nitrofurantoin monohyd macro, Penicillins, and Sulfa  antibiotics  Family History  Problem Relation Age of Onset   Dementia Mother    Alcohol abuse Father    Cancer Sister        lung   Lymphoma Sister    Melanoma Sister    Lymphoma Sister     Social History Social History   Tobacco Use   Smoking status: Former Smoker    Packs/day: 0.50    Years: 6.00    Pack years: 3.00    Types: Cigarettes    Quit date: 05/08/1987    Years since quitting: 32.9   Smokeless tobacco: Never Used  Vaping Use   Vaping Use: Never used  Substance Use Topics   Alcohol use: Never   Drug use: Never    Review of Systems Constitutional: No fever/chills Eyes: No visual changes. ENT: No sore throat. Cardiovascular: Denies chest pain. Respiratory: Denies shortness of breath. Gastrointestinal: Endorses abdominal pain.  Endorses nausea and vomiting.  No diarrhea. Genitourinary: Negative for dysuria. Musculoskeletal: Negative for acute arthralgias Skin: Negative for rash. Neurological: Negative for headaches, weakness/numbness/paresthesias in any extremity Psychiatric: Negative for suicidal ideation/homicidal ideation   ____________________________________________   PHYSICAL EXAM:  VITAL SIGNS: ED Triage Vitals  Enc Vitals Group     BP 04/23/20 1213 137/66     Pulse Rate 04/23/20 1213 66     Resp 04/23/20 1213 18     Temp 04/23/20 1213 98.7 F (37.1 C)     Temp Source 04/23/20 1213 Oral     SpO2 04/23/20  1213 100 %     Weight 04/23/20 1214 173 lb (78.5 kg)     Height 04/23/20 1214 _0  (1.549 m)     Head Circumference --      Peak Flow --      Pain Score 04/23/20 1214 5     Pain Loc --      Pain Edu? --      Excl. in [place]? --    Constitutional: Alert and oriented. Well appearing and in no acute distress. Eyes: Conjunctivae are normal. PERRL. Head: Atraumatic. Nose: No congestion/rhinnorhea. Mouth/Throat: Mucous membranes are moist. Neck: No stridor Cardiovascular: Grossly normal heart sounds.  Good peripheral  circulation. Respiratory: Normal respiratory effort.  No retractions. Gastrointestinal: Soft and nontender. No distention. Bedside ultrasound of the right upper quadrant shows cholelithiasis without any pericholecystic free fluid or gallbladder wall thickening Musculoskeletal: No obvious deformities Neurologic:  Normal speech and language. No gross focal neurologic deficits are appreciated. Skin:  Skin is warm and dry. No rash noted. Psychiatric: Mood and affect are normal. Speech and behavior are normal.  ____________________________________________   LABS (all labs ordered are listed, but only abnormal results are displayed)  Labs Reviewed  COMPREHENSIVE METABOLIC PANEL - Abnormal; Notable for the following components:      Result Value   Sodium 127 (*)    Chloride 91 (*)    Glucose, Bld 188 (*)    Total Bilirubin 1.9 (*)    All other components within normal limits  CBC - Abnormal; Notable for the following components:   Platelets 53 (*)    All other components within normal limits  LIPASE, BLOOD    PROCEDURES  Procedure(s) performed (including Critical Care):  Procedures   ____________________________________________   INITIAL IMPRESSION / ASSESSMENT AND PLAN / ED COURSE  As part of my medical decision making, I reviewed the following data within the [place] notes reviewed and incorporated, Labs reviewed, Old chart reviewed, and Notes from prior ED visits reviewed and incorporated        Presentation most consistent with biliary colic, without evidence of infection. History and exam AAA, pancreatitis, SBO, appendicitis, mesenteric ischemia, nephrolithiasis, pyelonephritis, or diverticulitis.  ED Interventions: analgesia PRN ED Workup: CBC, BMP, LFTs, Lipase, Abdominal US  Disposition: Refer to general surgery outpatient. Discharge home with SRP and instructions for primary care follow up within 24-48 hours.       ____________________________________________   FINAL CLINICAL IMPRESSION(S) / ED DIAGNOSES  Final diagnoses:  Right upper quadrant abdominal pain  Hyponatremia     ED Discharge Orders    None       Note:  This document was prepared using Dragon voice recognition software and may include unintentional dictation errors.   Naaman Plummer, MD 04/27/20 [phone]

## 2020-04-28 ENCOUNTER — Ambulatory Visit (INDEPENDENT_AMBULATORY_CARE_PROVIDER_SITE_OTHER): Payer: Medicare HMO | Admitting: Surgery

## 2020-04-28 ENCOUNTER — Other Ambulatory Visit
Admission: RE | Admit: 2020-04-28 | Discharge: 2020-04-28 | Disposition: A | Discharge status: Home / Self Care | Payer: Medicare HMO | Source: Ambulatory Visit | Attending: Surgery | Admitting: Surgery

## 2020-04-28 ENCOUNTER — Telehealth: Payer: Self-pay

## 2020-04-28 ENCOUNTER — Other Ambulatory Visit: Payer: Self-pay

## 2020-04-28 ENCOUNTER — Encounter: Payer: Self-pay | Admitting: Surgery

## 2020-04-28 VITALS — BP 95/64 | HR 83 | Temp 98.0°F | Ht 61.0 in | Wt 179.0 lb

## 2020-04-28 DIAGNOSIS — K7581 Nonalcoholic steatohepatitis (NASH): Secondary | ICD-10-CM | POA: Diagnosis not present

## 2020-04-28 DIAGNOSIS — K7460 Unspecified cirrhosis of liver: Secondary | ICD-10-CM | POA: Diagnosis not present

## 2020-04-28 DIAGNOSIS — R1084 Generalized abdominal pain: Secondary | ICD-10-CM | POA: Diagnosis not present

## 2020-04-28 LAB — CBC WITH DIFFERENTIAL/PLATELET
Abs Immature Granulocytes: 0.22 10*3/uL — ABNORMAL HIGH (ref 0.00–0.07)
Basophils Absolute: 0.1 10*3/uL (ref 0.0–0.1)
Basophils Relative: 0 %
Eosinophils Absolute: 0.2 10*3/uL (ref 0.0–0.5)
Eosinophils Relative: 1 %
HCT: 35.4 % — ABNORMAL LOW (ref 36.0–46.0)
Hemoglobin: 12.5 g/dL (ref 12.0–15.0)
Immature Granulocytes: 2 %
Lymphocytes Relative: 5 %
Lymphs Abs: 0.6 10*3/uL — ABNORMAL LOW (ref 0.7–4.0)
MCH: 30.7 pg (ref 26.0–34.0)
MCHC: 35.3 g/dL (ref 30.0–36.0)
MCV: 87 fL (ref 80.0–100.0)
Monocytes Absolute: 1.4 10*3/uL — ABNORMAL HIGH (ref 0.1–1.0)
Monocytes Relative: 10 %
Neutro Abs: 11.2 10*3/uL — ABNORMAL HIGH (ref 1.7–7.7)
Neutrophils Relative %: 82 %
Platelets: 126 10*3/uL — ABNORMAL LOW (ref 150–400)
RBC: 4.07 MIL/uL (ref 3.87–5.11)
RDW: 14.1 % (ref 11.5–15.5)
WBC: 13.6 10*3/uL — ABNORMAL HIGH (ref 4.0–10.5)
nRBC: 0 % (ref 0.0–0.2)

## 2020-04-28 LAB — COMPREHENSIVE METABOLIC PANEL
ALT: 12 U/L (ref 0–44)
AST: 23 U/L (ref 15–41)
Albumin: 3.2 g/dL — ABNORMAL LOW (ref 3.5–5.0)
Alkaline Phosphatase: 63 U/L (ref 38–126)
Anion gap: 11 (ref 5–15)
BUN: 43 mg/dL — ABNORMAL HIGH (ref 8–23)
CO2: 25 mmol/L (ref 22–32)
Calcium: 8.7 mg/dL — ABNORMAL LOW (ref 8.9–10.3)
Chloride: 87 mmol/L — ABNORMAL LOW (ref 98–111)
Creatinine, Ser: 1.35 mg/dL — ABNORMAL HIGH (ref 0.44–1.00)
GFR, Estimated: 40 mL/min — ABNORMAL LOW (ref >60)
Glucose, Bld: 159 mg/dL — ABNORMAL HIGH (ref 70–99)
Potassium: 4.2 mmol/L (ref 3.5–5.1)
Sodium: 123 mmol/L — ABNORMAL LOW (ref 135–145)
Total Bilirubin: 1.5 mg/dL — ABNORMAL HIGH (ref 0.3–1.2)
Total Protein: 6.7 g/dL (ref 6.5–8.1)

## 2020-04-28 LAB — APTT: aPTT: 32 seconds (ref 24–36)

## 2020-04-28 LAB — PROTIME-INR
INR: 1.3 — ABNORMAL HIGH (ref 0.8–1.2)
Prothrombin Time: 16.1 seconds — ABNORMAL HIGH (ref 11.4–15.2)

## 2020-04-28 NOTE — Telephone Encounter (Signed)
Spoke with the patient's daughter and let her know that the patient was very dehydrated. She suspected this and will be sure to have her drink plenty of fluids and Pedialyte/gatoraid to help. I let her know that they would be rechecking her creatinine prior to her CT scan due to contrast use.

## 2020-04-28 NOTE — Telephone Encounter (Signed)
-----   Message from Jules Husbands, MD sent at 04/28/2020  4:19 PM EST ----- Please let her know that her labs shows some dehydration. She needs to make sure she drinks plenty of fluids and also Pedialyte to replenish her hydration status ----- Message ----- From: Interface, Lab In [place] Sent: 04/28/2020   3:51 PM EST To: Jules Husbands, MD

## 2020-04-28 NOTE — Patient Instructions (Addendum)
We will get you set up with some laboratory work. This will be done at [facility] entrance.  CT scan will be set up to better assess your gallbladder and liver.   We will have you back here for a follow up in 3-4 weeks.   You are scheduled for a CT of the abdomen and pelvis with contrast at [place] on January 7th at 8:00 am. You will need to arrive there by 7:45 am and have noting to eat or drink after midnight. You will need to pick up a prep kit for this test at the imaging location.

## 2020-04-29 ENCOUNTER — Other Ambulatory Visit: Payer: Self-pay

## 2020-04-29 DIAGNOSIS — K7581 Nonalcoholic steatohepatitis (NASH): Secondary | ICD-10-CM

## 2020-04-29 DIAGNOSIS — R1084 Generalized abdominal pain: Secondary | ICD-10-CM

## 2020-05-02 NOTE — Progress Notes (Signed)
Patient ID: Jennifer [name], female   DOB: 03/01/1941, 79 y.o.   MRN: [id number]  HPI Jennifer [name] is a 79 y.o. female seen for recurrent abdominal pain.  She reports that over the last couple weeks she has had intermittent right upper quadrant abdominal pain moderate intensity and colicky type.  Apparently this is exacerbated by heavy meals.  She denies any fevers any chills any biliary obstruction any jaundice.  The patient does have a significant history of cirrhosis with portal hypertension.  At some point in time the patient had significant ascites and was in need of a TIPS procedure for portal hypertension and ascites.  It is unclear about what happened next apparently patient stated that she felt that God was going to make her better and she never went through the procedure.  She did have a recent ultrasound that I have personally reviewed showing evidence of cholelithiasis and evidence of cirrhosis with a trace ascites and splenomegaly.  Her INR 3 months ago was 1.2 CBC shows a normal white count but a platelet count of 53,000.  Her LFTs were normal except a total bilirubin of 1.9. she does have chronic hyponatremia She did have paracentesis before. She has had hysterectomy in the past. EGD pers. Reviewed showed portal gastropathy HPI  Past Medical History:  Diagnosis Date  . Allergy   . Arthritis   . Asthma   . Colon polyp   . Fatty liver   . GERD (gastroesophageal reflux disease)   . Hyperlipidemia   . Hypertension   . Motion sickness    boats    Past Surgical History:  Procedure Laterality Date  . ABDOMINAL HYSTERECTOMY  1978  . COLONOSCOPY WITH PROPOFOL N/A 02/01/2015   Procedure: COLONOSCOPY WITH PROPOFOL;  Surgeon: Lucilla Lame, MD;  Location: [place];  Service: Endoscopy;  Laterality: N/A;  Diabetic - oral meds  . COLONOSCOPY WITH PROPOFOL N/A 11/22/2017   Procedure: COLONOSCOPY WITH PROPOFOL;  Surgeon: Lin Landsman, MD;  Location: [facility] ENDOSCOPY;  Service:  Gastroenterology;  Laterality: N/A;  . ESOPHAGOGASTRODUODENOSCOPY (EGD) WITH PROPOFOL N/A 07/12/2017   Procedure: ESOPHAGOGASTRODUODENOSCOPY (EGD) WITH PROPOFOL;  Surgeon: Lin Landsman, MD;  Location: [facility] ENDOSCOPY;  Service: Gastroenterology;  Laterality: N/A;  . ESOPHAGOGASTRODUODENOSCOPY (EGD) WITH PROPOFOL N/A 08/16/2017   Procedure: ESOPHAGOGASTRODUODENOSCOPY (EGD) WITH PROPOFOL;  Surgeon: Lin Landsman, MD;  Location: [facility] ENDOSCOPY;  Service: Gastroenterology;  Laterality: N/A;  . ESOPHAGOGASTRODUODENOSCOPY (EGD) WITH PROPOFOL N/A 09/20/2017   Procedure: ESOPHAGOGASTRODUODENOSCOPY (EGD) WITH PROPOFOL;  Surgeon: Lin Landsman, MD;  Location: [facility] ENDOSCOPY;  Service: Gastroenterology;  Laterality: N/A;  . ESOPHAGOGASTRODUODENOSCOPY (EGD) WITH PROPOFOL N/A 11/01/2017   Procedure: ESOPHAGOGASTRODUODENOSCOPY (EGD) WITH PROPOFOL;  Surgeon: Lin Landsman, MD;  Location: [facility] ENDOSCOPY;  Service: Gastroenterology;  Laterality: N/A;  . ESOPHAGOGASTRODUODENOSCOPY (EGD) WITH PROPOFOL N/A 01/17/2018   Procedure: ESOPHAGOGASTRODUODENOSCOPY (EGD) WITH PROPOFOL;  Surgeon: Lin Landsman, MD;  Location: [facility] ENDOSCOPY;  Service: Gastroenterology;  Laterality: N/A;  . ESOPHAGOGASTRODUODENOSCOPY (EGD) WITH PROPOFOL N/A 06/20/2018   Procedure: ESOPHAGOGASTRODUODENOSCOPY (EGD) WITH PROPOFOL;  Surgeon: Lin Landsman, MD;  Location: [facility] ENDOSCOPY;  Service: Gastroenterology;  Laterality: N/A;  EGD/ with banding ligation   . IR RADIOLOGIST EVAL & MGMT  07/09/2018  . POLYPECTOMY  02/01/2015   Procedure: POLYPECTOMY;  Surgeon: Lucilla Lame, MD;  Location: [place];  Service: Endoscopy;;  . TONSILLECTOMY AND ADENOIDECTOMY      Family History  Problem Relation Age of Onset  . Dementia Mother   .  Alcohol abuse Father   . Cancer Sister        lung  . Lymphoma Sister   . Melanoma Sister   . Lymphoma Sister     Social History Social History   Tobacco Use  . Smoking  status: Former Smoker    Packs/day: 0.50    Years: 6.00    Pack years: 3.00    Types: Cigarettes    Quit date: 05/08/1987    Years since quitting: 33.0  . Smokeless tobacco: Never Used  Vaping Use  . Vaping Use: Never used  Substance Use Topics  . Alcohol use: Never  . Drug use: Never    Allergies  Allergen Reactions  . Amoxicillin Other (See Comments)    Yeast infections  . Codeine Other (See Comments)    Pt denies  . Hydrocodone-Acetaminophen Other (See Comments)    Pt denies  . Nitrofurantoin Other (See Comments)  . Nitrofurantoin Monohyd Macro     "Sugar got high"  . Penicillins     Has patient had a PCN reaction causing immediate rash, facial/tongue/throat swelling, SOB or lightheadedness with hypotension: Unknown Has patient had a PCN reaction causing severe rash involving mucus membranes or skin necrosis: Unknown Has patient had a PCN reaction that required hospitalization: Unknown Has patient had a PCN reaction occurring within the last 10 years: Unknown If all of the above answers are "NO", then may proceed with Cephalosporin use.  . Sulfa Antibiotics Other (See Comments)    Current Outpatient Medications  Medication Sig Dispense Refill  . albuterol (VENTOLIN HFA) 108 (90 Base) MCG/ACT inhaler Inhale 1-2 puffs into the lungs every 6 (six) hours as needed for wheezing or shortness of breath. 18 g 1  . ergocalciferol (VITAMIN D2) 50000 units capsule Take 50,000 Units by mouth once a week. Every Friday when remembers    . furosemide (LASIX) 20 MG tablet TAKE ONE TABLET EVERY DAY 90 tablet 1  . Magnesium 200 MG TABS Take 1 tablet by mouth daily.    . nadolol (CORGARD) 20 MG tablet TAKE ONE-HALF TABLET BY MOUTH EVERY DAY 45 tablet 3  . pantoprazole (PROTONIX) 40 MG tablet TAKE ONE TABLET BY MOUTH TWICE DAILY 180 tablet 0  . spironolactone (ALDACTONE) 100 MG tablet TAKE ONE TABLET BY MOUTH EVERY DAY 90 tablet 1  . Blood Glucose Monitoring Suppl (ACCU-CHEK AVIVA PLUS)  w/Device KIT To check blood sugar daily 1 kit 0  . FLUZONE HIGH-DOSE QUADRIVALENT 0.7 ML SUSY     . glucose blood (ACCU-CHEK AVIVA PLUS) test strip To check blood sugar daily 100 each 3  . Lancets (ACCU-CHEK SOFT TOUCH) lancets To check blood sugar daily 100 each 12   No current facility-administered medications for this visit.     Review of Systems Full ROS  was asked and was negative except for the information on the HPI  Physical Exam Blood pressure 95/64, pulse 83, temperature 98 F (36.7 C), height 5' 1"  (1.549 m), weight 179 lb (81.2 kg), SpO2 95 %. CONSTITUTIONAL: NAD BMI 33.8 EYES: Pupils are equal, round, and reactive to light, Sclera are non-icteric. EARS, NOSE, MOUTH AND THROAT: She is wearing a mask. Hearing is intact to voice. LYMPH NODES:  Lymph nodes in the neck are normal. RESPIRATORY:  Lungs are clear. There is normal respiratory effort, with equal breath sounds bilaterally, and without pathologic use of accessory muscles. CARDIOVASCULAR: Heart is regular without murmurs, gallops, or rubs. GI: The abdomen is soft, nontender, and nondistended. There are  no palpable masses. Exam is limited due to body habitus, not easy to assess for ascites. There is no hepatosplenomegaly. There are normal bowel sounds in all quadrants. GU: Rectal deferred.   MUSCULOSKELETAL: Normal muscle strength and tone. No cyanosis or edema.   SKIN: Turgor is good and there are no pathologic skin lesions or ulcers. NEUROLOGIC: Motor and sensation is grossly normal. Cranial nerves are grossly intact. PSYCH:  Oriented to person, place and time. Affect is normal.  Data Reviewed I have personally reviewed the patient's imaging, laboratory findings and medical records.    Assessment/Plan   79 year old female with significant history of liver cirrhosis at least child's B comes in with recurrent abdominal pain and cholelithiasis.  First order of business is to establish accurately her liver reserve and  the degree of cirrhosis.  This will help [place] determine whether or not she is a reasonable surgical candidate for potential cholecystectomy.  We will start with a CBC, CMP, INR as well as a CT scan of the abdomen and pelvis to evaluate for cirrhosis.  Currently she is nontoxic and does not require hospitalization or emergent surgical intervention.  Extensive counseling provided to the patient and the daughter  Caroleen Hamman, MD [facility] General Surgeon 05/02/2020, 4:01 PM

## 2020-05-06 ENCOUNTER — Other Ambulatory Visit
Admission: RE | Admit: 2020-05-06 | Discharge: 2020-05-06 | Disposition: A | Discharge status: Home / Self Care | Payer: Medicare HMO | Source: Ambulatory Visit | Attending: Surgery | Admitting: Surgery

## 2020-05-06 DIAGNOSIS — K7581 Nonalcoholic steatohepatitis (NASH): Secondary | ICD-10-CM | POA: Diagnosis not present

## 2020-05-06 DIAGNOSIS — R1084 Generalized abdominal pain: Secondary | ICD-10-CM | POA: Diagnosis not present

## 2020-05-06 DIAGNOSIS — K7460 Unspecified cirrhosis of liver: Secondary | ICD-10-CM | POA: Insufficient documentation

## 2020-05-06 LAB — COMPREHENSIVE METABOLIC PANEL
ALT: 15 U/L (ref 0–44)
AST: 27 U/L (ref 15–41)
Albumin: 3.2 g/dL — ABNORMAL LOW (ref 3.5–5.0)
Alkaline Phosphatase: 72 U/L (ref 38–126)
Anion gap: 8 (ref 5–15)
BUN: 10 mg/dL (ref 8–23)
CO2: 29 mmol/L (ref 22–32)
Calcium: 9.2 mg/dL (ref 8.9–10.3)
Chloride: 84 mmol/L — ABNORMAL LOW (ref 98–111)
Creatinine, Ser: 0.82 mg/dL (ref 0.44–1.00)
GFR, Estimated: >60 mL/min (ref >60)
Glucose, Bld: 129 mg/dL — ABNORMAL HIGH (ref 70–99)
Potassium: 5.3 mmol/L — ABNORMAL HIGH (ref 3.5–5.1)
Sodium: 121 mmol/L — ABNORMAL LOW (ref 135–145)
Total Bilirubin: 1.7 mg/dL — ABNORMAL HIGH (ref 0.3–1.2)
Total Protein: 6.8 g/dL (ref 6.5–8.1)

## 2020-05-11 ENCOUNTER — Other Ambulatory Visit: Payer: Self-pay | Admitting: Physician Assistant

## 2020-05-11 ENCOUNTER — Telehealth: Payer: Self-pay

## 2020-05-11 DIAGNOSIS — M7989 Other specified soft tissue disorders: Secondary | ICD-10-CM

## 2020-05-11 NOTE — Telephone Encounter (Signed)
Pt called back for results. Verbalizes understanding. Pt is scheduled for CT on 01/07 and Dr. Dahlia Byes on 01/17.

## 2020-05-11 NOTE — Telephone Encounter (Signed)
LVM for pt to call office for lab results

## 2020-05-14 ENCOUNTER — Ambulatory Visit
Admission: RE | Admit: 2020-05-14 | Discharge: 2020-05-14 | Disposition: A | Discharge status: Home / Self Care | Payer: Medicare HMO | Source: Ambulatory Visit | Attending: Surgery | Admitting: Surgery

## 2020-05-14 ENCOUNTER — Other Ambulatory Visit: Payer: Self-pay

## 2020-05-14 ENCOUNTER — Telehealth: Payer: Self-pay

## 2020-05-14 DIAGNOSIS — K811 Chronic cholecystitis: Secondary | ICD-10-CM | POA: Diagnosis not present

## 2020-05-14 DIAGNOSIS — R1084 Generalized abdominal pain: Secondary | ICD-10-CM | POA: Diagnosis not present

## 2020-05-14 DIAGNOSIS — K7581 Nonalcoholic steatohepatitis (NASH): Secondary | ICD-10-CM | POA: Diagnosis not present

## 2020-05-14 DIAGNOSIS — R109 Unspecified abdominal pain: Secondary | ICD-10-CM | POA: Diagnosis not present

## 2020-05-14 DIAGNOSIS — K7460 Unspecified cirrhosis of liver: Secondary | ICD-10-CM | POA: Insufficient documentation

## 2020-05-14 MED ORDER — IOHEXOL 300 MG/ML  SOLN
100.0000 mL | Freq: Once | INTRAMUSCULAR | Status: AC | PRN
  Administered 2020-05-14: 100 mL via INTRAVENOUS

## 2020-05-14 NOTE — Telephone Encounter (Signed)
Pt notified of CT results. Pt has a f/u appt on 05/24/20 @ 10am. Verbalizes understanding.

## 2020-05-17 ENCOUNTER — Telehealth: Payer: Self-pay

## 2020-05-17 NOTE — Telephone Encounter (Signed)
Copied from [place] [id number]. Topic: General - Inquiry >> May 17, 2020 11:28 AM Gillis Ends D wrote: Reason for CRM: Patient would like for Dr. Marlyn Corporal to call her in something for a UTI. I offered her an appointment but she refused she wanted me to send a message. She can be reached at [phone]. Please advise

## 2020-05-18 NOTE — Telephone Encounter (Signed)
Unfortunately it has been a long time since she had a urine culture done. She does not need appt but could she have someone pick up a urine cup for her to collect for [place] to send off for testing?  If not, she may require an appt for [place] to collect in office.

## 2020-05-19 ENCOUNTER — Encounter: Payer: Self-pay | Admitting: Emergency Medicine

## 2020-05-19 ENCOUNTER — Inpatient Hospital Stay
Admission: EM | Admit: 2020-05-19 | Discharge: 2020-05-24 | DRG: 641 | Disposition: A | Discharge status: Home / Self Care | Payer: Medicare HMO | Attending: Internal Medicine | Admitting: Internal Medicine

## 2020-05-19 ENCOUNTER — Other Ambulatory Visit: Payer: Self-pay

## 2020-05-19 ENCOUNTER — Emergency Department: Payer: Medicare HMO

## 2020-05-19 DIAGNOSIS — Z880 Allergy status to penicillin: Secondary | ICD-10-CM | POA: Diagnosis not present

## 2020-05-19 DIAGNOSIS — I10 Essential (primary) hypertension: Secondary | ICD-10-CM | POA: Diagnosis not present

## 2020-05-19 DIAGNOSIS — Z79899 Other long term (current) drug therapy: Secondary | ICD-10-CM | POA: Diagnosis not present

## 2020-05-19 DIAGNOSIS — G8929 Other chronic pain: Secondary | ICD-10-CM | POA: Diagnosis present

## 2020-05-19 DIAGNOSIS — R0689 Other abnormalities of breathing: Secondary | ICD-10-CM | POA: Diagnosis not present

## 2020-05-19 DIAGNOSIS — K7581 Nonalcoholic steatohepatitis (NASH): Secondary | ICD-10-CM | POA: Diagnosis present

## 2020-05-19 DIAGNOSIS — Z882 Allergy status to sulfonamides status: Secondary | ICD-10-CM

## 2020-05-19 DIAGNOSIS — K8020 Calculus of gallbladder without cholecystitis without obstruction: Secondary | ICD-10-CM

## 2020-05-19 DIAGNOSIS — N390 Urinary tract infection, site not specified: Secondary | ICD-10-CM | POA: Diagnosis present

## 2020-05-19 DIAGNOSIS — R600 Localized edema: Secondary | ICD-10-CM | POA: Diagnosis present

## 2020-05-19 DIAGNOSIS — E118 Type 2 diabetes mellitus with unspecified complications: Secondary | ICD-10-CM | POA: Diagnosis not present

## 2020-05-19 DIAGNOSIS — Z1629 Resistance to other single specified antibiotic: Secondary | ICD-10-CM | POA: Diagnosis present

## 2020-05-19 DIAGNOSIS — K7469 Other cirrhosis of liver: Secondary | ICD-10-CM | POA: Diagnosis present

## 2020-05-19 DIAGNOSIS — R1011 Right upper quadrant pain: Secondary | ICD-10-CM | POA: Diagnosis present

## 2020-05-19 DIAGNOSIS — Z20822 Contact with and (suspected) exposure to covid-19: Secondary | ICD-10-CM | POA: Diagnosis not present

## 2020-05-19 DIAGNOSIS — R1084 Generalized abdominal pain: Secondary | ICD-10-CM | POA: Diagnosis not present

## 2020-05-19 DIAGNOSIS — Z90710 Acquired absence of both cervix and uterus: Secondary | ICD-10-CM | POA: Diagnosis not present

## 2020-05-19 DIAGNOSIS — R457 State of emotional shock and stress, unspecified: Secondary | ICD-10-CM | POA: Diagnosis not present

## 2020-05-19 DIAGNOSIS — K8010 Calculus of gallbladder with chronic cholecystitis without obstruction: Secondary | ICD-10-CM | POA: Diagnosis not present

## 2020-05-19 DIAGNOSIS — Z885 Allergy status to narcotic agent status: Secondary | ICD-10-CM | POA: Diagnosis not present

## 2020-05-19 DIAGNOSIS — E871 Hypo-osmolality and hyponatremia: Secondary | ICD-10-CM | POA: Diagnosis not present

## 2020-05-19 DIAGNOSIS — Z888 Allergy status to other drugs, medicaments and biological substances status: Secondary | ICD-10-CM

## 2020-05-19 DIAGNOSIS — R0902 Hypoxemia: Secondary | ICD-10-CM | POA: Diagnosis not present

## 2020-05-19 DIAGNOSIS — R1013 Epigastric pain: Secondary | ICD-10-CM | POA: Diagnosis not present

## 2020-05-19 DIAGNOSIS — K7460 Unspecified cirrhosis of liver: Secondary | ICD-10-CM | POA: Diagnosis not present

## 2020-05-19 DIAGNOSIS — R188 Other ascites: Secondary | ICD-10-CM | POA: Diagnosis not present

## 2020-05-19 LAB — BASIC METABOLIC PANEL
Anion gap: 13 (ref 5–15)
BUN: 5 mg/dL — ABNORMAL LOW (ref 8–23)
CO2: 19 mmol/L — ABNORMAL LOW (ref 22–32)
Calcium: 8.6 mg/dL — ABNORMAL LOW (ref 8.9–10.3)
Chloride: 76 mmol/L — ABNORMAL LOW (ref 98–111)
Creatinine, Ser: 0.7 mg/dL (ref 0.44–1.00)
GFR, Estimated: >60 mL/min (ref >60)
Glucose, Bld: 171 mg/dL — ABNORMAL HIGH (ref 70–99)
Potassium: 3.7 mmol/L (ref 3.5–5.1)
Sodium: 108 mmol/L — CL (ref 135–145)

## 2020-05-19 LAB — CBC
HCT: 32.3 % — ABNORMAL LOW (ref 36.0–46.0)
Hemoglobin: 12.1 g/dL (ref 12.0–15.0)
MCH: 31.4 pg (ref 26.0–34.0)
MCHC: 37.5 g/dL — ABNORMAL HIGH (ref 30.0–36.0)
MCV: 83.9 fL (ref 80.0–100.0)
Platelets: 72 10*3/uL — ABNORMAL LOW (ref 150–400)
RBC: 3.85 MIL/uL — ABNORMAL LOW (ref 3.87–5.11)
RDW: 14.1 % (ref 11.5–15.5)
WBC: 5.2 10*3/uL (ref 4.0–10.5)
nRBC: 0 % (ref 0.0–0.2)

## 2020-05-19 LAB — COMPREHENSIVE METABOLIC PANEL
ALT: 17 U/L (ref 0–44)
AST: 38 U/L (ref 15–41)
Albumin: 3.4 g/dL — ABNORMAL LOW (ref 3.5–5.0)
Alkaline Phosphatase: 73 U/L (ref 38–126)
Anion gap: 14 (ref 5–15)
BUN: <5 mg/dL — ABNORMAL LOW (ref 8–23)
CO2: 21 mmol/L — ABNORMAL LOW (ref 22–32)
Calcium: 8.6 mg/dL — ABNORMAL LOW (ref 8.9–10.3)
Chloride: 72 mmol/L — ABNORMAL LOW (ref 98–111)
Creatinine, Ser: 0.64 mg/dL (ref 0.44–1.00)
GFR, Estimated: >60 mL/min (ref >60)
Glucose, Bld: 152 mg/dL — ABNORMAL HIGH (ref 70–99)
Potassium: 3.2 mmol/L — ABNORMAL LOW (ref 3.5–5.1)
Sodium: 107 mmol/L — CL (ref 135–145)
Total Bilirubin: 2 mg/dL — ABNORMAL HIGH (ref 0.3–1.2)
Total Protein: 6.7 g/dL (ref 6.5–8.1)

## 2020-05-19 LAB — LIPASE, BLOOD: Lipase: 41 U/L (ref 11–51)

## 2020-05-19 MED ORDER — ENOXAPARIN SODIUM 40 MG/0.4ML ~~LOC~~ SOLN: 40.0000 mg | SUBCUTANEOUS | Status: DC

## 2020-05-19 MED ORDER — ONDANSETRON HCL 4 MG/2ML IJ SOLN
4.0000 mg | Freq: Four times a day (QID) | INTRAMUSCULAR | Status: DC | PRN
  Administered 2020-05-20: 4 mg via INTRAVENOUS
  Filled 2020-05-19: qty 2

## 2020-05-19 MED ORDER — ONDANSETRON HCL 4 MG/2ML IJ SOLN
4.0000 mg | Freq: Once | INTRAMUSCULAR | Status: AC
  Administered 2020-05-19: 4 mg via INTRAVENOUS
  Filled 2020-05-19: qty 2

## 2020-05-19 MED ORDER — SODIUM CHLORIDE 0.9 % IV SOLN: Freq: Once | INTRAVENOUS | Status: AC

## 2020-05-19 MED ORDER — ONDANSETRON HCL 4 MG PO TABS: 4.0000 mg | ORAL_TABLET | Freq: Four times a day (QID) | ORAL | Status: DC | PRN

## 2020-05-19 MED ORDER — SODIUM CHLORIDE 3 % IV SOLN
INTRAVENOUS | Status: DC
  Filled 2020-05-19 (×5): qty 500

## 2020-05-19 MED ORDER — FENTANYL CITRATE (PF) 100 MCG/2ML IJ SOLN
50.0000 ug | Freq: Once | INTRAMUSCULAR | Status: AC
  Administered 2020-05-19: 50 ug via INTRAVENOUS
  Filled 2020-05-19: qty 2

## 2020-05-19 MED ORDER — INSULIN ASPART 100 UNIT/ML ~~LOC~~ SOLN
0.0000 [IU] | Freq: Three times a day (TID) | SUBCUTANEOUS | Status: DC
  Administered 2020-05-20 – 2020-05-22 (×2): 3 [IU] via SUBCUTANEOUS
  Administered 2020-05-22 – 2020-05-23 (×3): 2 [IU] via SUBCUTANEOUS
  Filled 2020-05-19 (×4): qty 1

## 2020-05-19 MED ORDER — INSULIN ASPART 100 UNIT/ML ~~LOC~~ SOLN
0.0000 [IU] | Freq: Every day | SUBCUTANEOUS | Status: DC
  Filled 2020-05-19: qty 1

## 2020-05-19 MED ORDER — SODIUM CHLORIDE 3 % IV BOLUS
250.0000 mL | Freq: Once | INTRAVENOUS | Status: AC
  Administered 2020-05-19: 250 mL via INTRAVENOUS
  Filled 2020-05-19: qty 250

## 2020-05-19 MED ORDER — ACETAMINOPHEN 325 MG PO TABS: 650.0000 mg | ORAL_TABLET | Freq: Four times a day (QID) | ORAL | Status: DC | PRN

## 2020-05-19 MED ORDER — ACETAMINOPHEN 650 MG RE SUPP: 650.0000 mg | Freq: Four times a day (QID) | RECTAL | Status: DC | PRN

## 2020-05-19 MED ORDER — ENOXAPARIN SODIUM 40 MG/0.4ML ~~LOC~~ SOLN
0.5000 mg/kg | SUBCUTANEOUS | Status: DC
  Administered 2020-05-20 – 2020-05-24 (×4): 40 mg via SUBCUTANEOUS
  Filled 2020-05-19 (×4): qty 0.4

## 2020-05-19 NOTE — ED Triage Notes (Addendum)
Pt comes into the ED via [facility] from home c/o generalized abdominal pain.  Pt also c/o nausea and vomiting.  Pt diagnosed with gallstones on 04/23/20 but has yet to be scheduled for surgery and now the patient is having a "flare up".  Pt unable to answer questions at this time due to hyperventilation.  Pt has her head leaned back in the wheelchair at this time. Pt placed on 2L nasal cannula for comfort d/t the hyperventilation per EMS.

## 2020-05-19 NOTE — ED Provider Notes (Signed)
[facility] Emergency Department Provider Note ____________________________________________   Event Date/Time   First MD Initiated Contact with Patient 05/19/20 1826     (approximate)  I have reviewed the triage vital signs and the nursing notes.  HISTORY  Chief Complaint Abdominal Pain   HPI Jennifer [name] is a 80 y.o. femalewho presents to the ED for evaluation of abdominal pain and emesis.   Chart review indicates history of cholelithiasis, outpatient CT 5 days ago confirms this, as well as history of liver cirrhosis.  Being worked up for cholecystectomy by Dr. Dahlia Byes, last seen on 12/22.  Patient presents to the ED due to feeling "just so sick" with recurrent emesis and chronic epigastric pain and nausea.  She reports drinking copious water due to her discomfort and emesis.  History somewhat limited due to acuity and patient's altered mentation on arrival.  Past Medical History:  Diagnosis Date  . Allergy   . Arthritis   . Asthma   . Colon polyp   . Fatty liver   . GERD (gastroesophageal reflux disease)   . Hyperlipidemia   . Hypertension   . Motion sickness    boats    Patient Active Problem List   Diagnosis Date Noted  . Hyponatremia 05/19/2020  . Essential hypertension, benign 02/04/2018  . Iron deficiency 02/04/2018  . Pelvic fracture ([place]) 01/31/2018  . Multiple gastric polyps   . Hx of colonic polyps   . Acute upper GI bleed 07/12/2017  . Liver cirrhosis secondary to NASH ([place])   . Carpal tunnel syndrome on right 09/28/2016  . Obesity (BMI 30.0-34.9) 09/20/2016  . Upper respiratory infection 03/17/2016  . Thrombocytopenia ([place]) 12/29/2015  . Benign neoplasm of ascending colon   . Benign neoplasm of transverse colon   . Benign neoplasm of sigmoid colon   . Allergic rhinitis 10/14/2014  . Airway hyperreactivity 10/14/2014  . Type 2 diabetes mellitus with unspecified complications ([place]) 24/01/7352  . Calculus of gallbladder  10/14/2014  . Esophagitis, reflux 10/14/2014  . Vitamin D deficiency 10/14/2014    Past Surgical History:  Procedure Laterality Date  . ABDOMINAL HYSTERECTOMY  1978  . COLONOSCOPY WITH PROPOFOL N/A 02/01/2015   Procedure: COLONOSCOPY WITH PROPOFOL;  Surgeon: Lucilla Lame, MD;  Location: [place];  Service: Endoscopy;  Laterality: N/A;  Diabetic - oral meds  . COLONOSCOPY WITH PROPOFOL N/A 11/22/2017   Procedure: COLONOSCOPY WITH PROPOFOL;  Surgeon: Lin Landsman, MD;  Location[facility] ENDOSCOPY;  Service: Gastroenterology;  Laterality: N/A;  . ESOPHAGOGASTRODUODENOSCOPY (EGD) WITH PROPOFOL N/A 07/12/2017   Procedure: ESOPHAGOGASTRODUODENOSCOPY (EGD) WITH PROPOFOL;  Surgeon: Lin Landsman, MD;  Location: [facility] ENDOSCOPY;  Service: Gastroenterology;  Laterality: N/A;  . ESOPHAGOGASTRODUODENOSCOPY (EGD) WITH PROPOFOL N/A 08/16/2017   Procedure: ESOPHAGOGASTRODUODENOSCOPY (EGD) WITH PROPOFOL;  Surgeon: Lin Landsman, MD;  Location: [facility] ENDOSCOPY;  Service: Gastroenterology;  Laterality: N/A;  . ESOPHAGOGASTRODUODENOSCOPY (EGD) WITH PROPOFOL N/A 09/20/2017   Procedure: ESOPHAGOGASTRODUODENOSCOPY (EGD) WITH PROPOFOL;  Surgeon: Lin Landsman, MD;  Location: [facility] ENDOSCOPY;  Service: Gastroenterology;  Laterality: N/A;  . ESOPHAGOGASTRODUODENOSCOPY (EGD) WITH PROPOFOL N/A 11/01/2017   Procedure: ESOPHAGOGASTRODUODENOSCOPY (EGD) WITH PROPOFOL;  Surgeon: Lin Landsman, MD;  Location: [facility] ENDOSCOPY;  Service: Gastroenterology;  Laterality: N/A;  . ESOPHAGOGASTRODUODENOSCOPY (EGD) WITH PROPOFOL N/A 01/17/2018   Procedure: ESOPHAGOGASTRODUODENOSCOPY (EGD) WITH PROPOFOL;  Surgeon: Lin Landsman, MD;  Location: [facility] ENDOSCOPY;  Service: Gastroenterology;  Laterality: N/A;  . ESOPHAGOGASTRODUODENOSCOPY (EGD) WITH PROPOFOL N/A 06/20/2018   Procedure: ESOPHAGOGASTRODUODENOSCOPY (EGD)  WITH PROPOFOL;  Surgeon: Lin Landsman, MD;  Location: [facility] ENDOSCOPY;  Service:  Gastroenterology;  Laterality: N/A;  EGD/ with banding ligation   . IR RADIOLOGIST EVAL & MGMT  07/09/2018  . POLYPECTOMY  02/01/2015   Procedure: POLYPECTOMY;  Surgeon: Lucilla Lame, MD;  Location: [place];  Service: Endoscopy;;  . TONSILLECTOMY AND ADENOIDECTOMY      Prior to Admission medications   Medication Sig Start Date End Date Taking? Authorizing Provider  albuterol (VENTOLIN HFA) 108 (90 Base) MCG/ACT inhaler Inhale 1-2 puffs into the lungs every 6 (six) hours as needed for wheezing or shortness of breath. 12/05/19   Mar Daring, PA-C  Blood Glucose Monitoring Suppl (ACCU-CHEK AVIVA PLUS) w/Device KIT To check blood sugar daily 03/26/20   Mar Daring, PA-C  ergocalciferol (VITAMIN D2) 50000 units capsule Take 50,000 Units by mouth once a week. Every Friday when remembers 02/01/18   [provider]  FLUZONE HIGH-DOSE QUADRIVALENT 0.7 ML SUSY  01/22/19   [provider]  furosemide (LASIX) 20 MG tablet TAKE ONE TABLET EVERY DAY 02/16/20   Mar Daring, PA-C  glucose blood (ACCU-CHEK AVIVA PLUS) test strip To check blood sugar daily 01/28/20   Mar Daring, PA-C  Lancets (ACCU-CHEK SOFT TOUCH) lancets To check blood sugar daily 12/12/18   Mar Daring, PA-C  Magnesium 200 MG TABS Take 1 tablet by mouth daily.    [provider]  nadolol (CORGARD) 20 MG tablet TAKE ONE-HALF TABLET BY MOUTH EVERY DAY 10/07/19   Mar Daring, PA-C  pantoprazole (PROTONIX) 40 MG tablet TAKE ONE TABLET BY MOUTH TWICE DAILY 02/26/20   Mar Daring, PA-C  spironolactone (ALDACTONE) 100 MG tablet TAKE ONE TABLET BY MOUTH EVERY DAY 03/04/20   Mar Daring, PA-C    Allergies Amoxicillin, Codeine, Hydrocodone-acetaminophen, Nitrofurantoin, Nitrofurantoin monohyd macro, Penicillins, and Sulfa antibiotics  Family History  Problem Relation Age of Onset  . Dementia Mother   . Alcohol abuse Father   . Cancer Sister         lung  . Lymphoma Sister   . Melanoma Sister   . Lymphoma Sister     Social History Social History   Tobacco Use  . Smoking status: Former Smoker    Packs/day: 0.50    Years: 6.00    Pack years: 3.00    Types: Cigarettes    Quit date: 05/08/1987    Years since quitting: 33.0  . Smokeless tobacco: Never Used  Vaping Use  . Vaping Use: Never used  Substance Use Topics  . Alcohol use: Never  . Drug use: Never    Review of Systems  Unable to be accurately assessed due to acuity on presentation  ____________________________________________   PHYSICAL EXAM:  VITAL SIGNS: Vitals:   05/19/20 2215 05/19/20 2230  BP: 107/63 (!) 98/52  Pulse: 81 88  Resp: (!) 21 19  Temp:    SpO2: 97% 97%     Constitutional: Alert , keeping her eyes closed.  Diffusely tremulous without focal seizure-like activity.  She responds to questions appropriately and follows commands in all 4 extremities. Eyes: Conjunctivae are normal. PERRL. EOMI. Head: Atraumatic. Nose: No congestion/rhinnorhea. Mouth/Throat: Mucous membranes are dry.  Oropharynx non-erythematous. Neck: No stridor. No cervical spine tenderness to palpation. Cardiovascular: Tachycardic rate, regular rhythm. Grossly normal heart sounds.  Good peripheral circulation. Respiratory: Tachypneic to the low 30s, good air movement throughout. Gastrointestinal: Soft , mildly distended.  Diffuse tenderness, primarily to the  epigastrium, without peritoneal features. Musculoskeletal: No joint effusions. No signs of acute trauma. Neurologic:No gross focal neurologic deficits are appreciated.  Follows commands in all 4 extremities Skin:  Skin is warm, dry and intact. No rash noted. Psychiatric: Mood and affect are altered  ____________________________________________   LABS (all labs ordered are listed, but only abnormal results are displayed)  Labs Reviewed  COMPREHENSIVE METABOLIC PANEL - Abnormal; Notable for the following  components:      Result Value   Sodium 107 (*)    Potassium 3.2 (*)    Chloride 72 (*)    CO2 21 (*)    Glucose, Bld 152 (*)    BUN <5 (*)    Calcium 8.6 (*)    Albumin 3.4 (*)    Total Bilirubin 2.0 (*)    All other components within normal limits  CBC - Abnormal; Notable for the following components:   RBC 3.85 (*)    HCT 32.3 (*)    MCHC 37.5 (*)    Platelets 72 (*)    All other components within normal limits  BASIC METABOLIC PANEL - Abnormal; Notable for the following components:   Sodium 108 (*)    Chloride 76 (*)    CO2 19 (*)    Glucose, Bld 171 (*)    BUN 5 (*)    Calcium 8.6 (*)    All other components within normal limits  LIPASE, BLOOD  URINALYSIS, COMPLETE (UACMP) WITH MICROSCOPIC  BASIC METABOLIC PANEL  POC SARS CORONAVIRUS 2 AG -  ED   ____________________________________________  12 Lead EKG   ____________________________________________  RADIOLOGY  ED MD interpretation: RUQ ultrasound reviewed by me with gallstones without evidence of cholecystitis.  Official radiology report(s): US Abdomen Limited RUQ (LIVER/GB)  Result Date: 05/19/2020 CLINICAL DATA:  Cholelithiasis EXAM: ULTRASOUND ABDOMEN LIMITED RIGHT UPPER QUADRANT COMPARISON:  CT 05/14/2020 FINDINGS: Gallbladder: 1.2 cm stone noted within the gallbladder neck. Sludge within the gallbladder. No wall thickening. No reported sonographic Murphy sign. Common bile duct: Diameter: Upper limits normal in diameter, 7 mm. Distal duct is not visualized due to overlying bowel gas. Liver: Coarsened, increased echotexture throughout the liver. Nodular contours and shrunken appearance. Findings compatible with cirrhosis. No flow seen within the portal vein concerning for portal venous thrombosis. Other: Moderate to large volume ascites. IMPRESSION: Changes of cirrhosis with nodular shrunken liver and associated ascites. No flow seen within the portal vein concerning for portal venous thrombosis. Cholelithiasis.   No sonographic evidence of acute cholecystitis. Electronically Signed   By: Rolm Baptise M.D.   On: 05/19/2020 20:18    ____________________________________________   PROCEDURES and INTERVENTIONS  Procedure(s) performed (including Critical Care):  .1-3 Lead EKG Interpretation Performed by: Vladimir Crofts, MD Authorized by: Vladimir Crofts, MD     Interpretation: abnormal     ECG rate:  110   ECG rate assessment: tachycardic     Rhythm: sinus tachycardia     Ectopy: none     Conduction: normal   .Critical Care Performed by: Vladimir Crofts, MD Authorized by: Vladimir Crofts, MD   Critical care provider statement:    Critical care time (minutes):  45   Critical care was necessary to treat or prevent imminent or life-threatening deterioration of the following conditions:  Metabolic crisis   Critical care was time spent personally by me on the following activities:  Discussions with consultants, evaluation of patient's response to treatment, examination of patient, ordering and performing treatments and interventions, ordering and review of laboratory  studies, ordering and review of radiographic studies, pulse oximetry, re-evaluation of patient's condition, obtaining history from patient or surrogate and review of old charts    Medications  sodium chloride 3% (hypertonic) IV bolus 250 mL (0 mLs Intravenous Stopped 05/19/20 1918)  ondansetron (ZOFRAN) injection 4 mg (4 mg Intravenous Given 05/19/20 1905)  0.9 %  sodium chloride infusion ( Intravenous New Bag/Given 05/19/20 1932)  fentaNYL (SUBLIMAZE) injection 50 mcg (50 mcg Intravenous Given 05/19/20 1937)    ____________________________________________   MDM / ED COURSE   80 year old woman with gallstones who has been drinking too much water presents to the ED agitated, found to be extremely hyponatremic necessitating hypertonic saline and medical admission.  Hemodynamically stable.  Exam with generalized tremulousness and agitation without  focal seizure.  Tender epigastric abdomen, but no peritoneal features.  No evidence of neurovascular deficits or trauma.  Blood work with severe hyponatremia with a sodium of 107.  Immediately provided hypertonic saline, and initiated normal saline drip thereafter.  As the drip was being started we rechecked her metabolic panel with minimal improvement of her sodium, and she has been on normal saline drip for the past 4 hours at 150 mL/h.  We will recheck a metabolic panel around midnight and admit the patient to hospitalist medicine for further work-up and management.  Repeat RUQ ultrasound performed with continued evidence of cholelithiasis without evidence of cholecystitis to necessitate surgical evaluation.   Clinical Course as of 05/19/20 2322  Wed May 19, 2020  1855 Hypertonic saline being infused.  No seizure activity.  Patient is alert, but repetitively saying "oh God, oh God."  Indicates epigastric pain and nausea. [DS]  2010 Repeat sodium hardly improved after hypertonic saline, we will increase the rate to 150/h at this point.  Awaiting ultrasound results to help guide disposition to surgical versus medical [DS]  2036 Reassessed.  Patient was calm down and looks clinically improved.  We discussed drinking too much water and low sodium levels.  We discussed the need for medical admission and she is agreeable. [DS]    Clinical Course User Index [DS] Vladimir Crofts, MD    ____________________________________________   FINAL CLINICAL IMPRESSION(S) / ED DIAGNOSES  Final diagnoses:  Cholelithiases  Hyponatremia  Epigastric pain     ED Discharge Orders    None       Verenis Nicosia Tamala Julian   Note:  This document was prepared using Dragon voice recognition software and may include unintentional dictation errors.   Vladimir Crofts, MD 05/19/20 [phone]

## 2020-05-19 NOTE — ED Notes (Signed)
Spoke w/ Daughter   Clenton Pare [phone] / gave update and pt status.   Son  Ledell Noss  [phone]

## 2020-05-19 NOTE — ED Notes (Signed)
Patient transported to Ultrasound 

## 2020-05-19 NOTE — ED Notes (Signed)
Son listed in computer would like to know when patient in room if you can

## 2020-05-19 NOTE — ED Notes (Signed)
Date and time results received: 05/19/20 8:08  Test: Sodium  Critical Value:108  Name of Provider Notified:MD. Tamala Julian

## 2020-05-19 NOTE — H&P (Addendum)
History and Physical    Jennifer [name] DJS:[id number] DOB: Aug 01, 1940 DOA: 05/19/2020  PCP: Mar Daring, PA-C   Patient coming from: home  I have personally briefly reviewed patient's old medical records in [place]  Chief Complaint: RUQ pain  HPI: Jennifer [name] is a 80 y.o. female with medical history significant for   DM and cirrhosis with chronic hyponatremia secondary to NASH who presents to the emergency room with a 2-day history of right upper quadrant pain off and on for the past 3 weeks associated with nausea .  Pain is about 6 out of 10, colicky, nonradiating, made worse with oral intake.  She denies cough fever or chills and denies chest pain or shortness of breath. ED course: On arrival, vitals within normal limits.  Blood work significant for sodium of 107.  Sodium was 121 2 weeks ago and chronically appears to stay between 127 and 130.  Labs were otherwise unremarkable Right upper quadrant sonogram: Findings consistent with cirrhosis.  No sonographic evidence of acute cholecystitis   Review of Systems: As per HPI otherwise all other systems on review of systems negative.    Past Medical History:  Diagnosis Date  . Allergy   . Arthritis   . Asthma   . Colon polyp   . Fatty liver   . GERD (gastroesophageal reflux disease)   . Hyperlipidemia   . Hypertension   . Motion sickness    boats    Past Surgical History:  Procedure Laterality Date  . ABDOMINAL HYSTERECTOMY  1978  . COLONOSCOPY WITH PROPOFOL N/A 02/01/2015   Procedure: COLONOSCOPY WITH PROPOFOL;  Surgeon: Lucilla Lame, MD;  Location: [place];  Service: Endoscopy;  Laterality: N/A;  Diabetic - oral meds  . COLONOSCOPY WITH PROPOFOL N/A 11/22/2017   Procedure: COLONOSCOPY WITH PROPOFOL;  Surgeon: Lin Landsman, MD;  Location: [facility] ENDOSCOPY;  Service: Gastroenterology;  Laterality: N/A;  . ESOPHAGOGASTRODUODENOSCOPY (EGD) WITH PROPOFOL N/A 07/12/2017   Procedure:  ESOPHAGOGASTRODUODENOSCOPY (EGD) WITH PROPOFOL;  Surgeon: Lin Landsman, MD;  Location: [facility] ENDOSCOPY;  Service: Gastroenterology;  Laterality: N/A;  . ESOPHAGOGASTRODUODENOSCOPY (EGD) WITH PROPOFOL N/A 08/16/2017   Procedure: ESOPHAGOGASTRODUODENOSCOPY (EGD) WITH PROPOFOL;  Surgeon: Lin Landsman, MD;  Location: [facility] ENDOSCOPY;  Service: Gastroenterology;  Laterality: N/A;  . ESOPHAGOGASTRODUODENOSCOPY (EGD) WITH PROPOFOL N/A 09/20/2017   Procedure: ESOPHAGOGASTRODUODENOSCOPY (EGD) WITH PROPOFOL;  Surgeon: Lin Landsman, MD;  Location: [facility] ENDOSCOPY;  Service: Gastroenterology;  Laterality: N/A;  . ESOPHAGOGASTRODUODENOSCOPY (EGD) WITH PROPOFOL N/A 11/01/2017   Procedure: ESOPHAGOGASTRODUODENOSCOPY (EGD) WITH PROPOFOL;  Surgeon: Lin Landsman, MD;  Location[facility]COPY;  Service: Gastroenterology;  Laterality: N/A;  . ESOPHAGOGASTRODUODENOSCOPY (EGD) WITH PROPOFOL N/A 01/17/2018   Procedure: ESOPHAGOGASTRODUODENOSCOPY (EGD) WITH PROPOFOL;  Surgeon: Lin Landsman, MD;  Location: [facility] ENDOSCOPY;  Service: Gastroenterology;  Laterality: N/A;  . ESOPHAGOGASTRODUODENOSCOPY (EGD) WITH PROPOFOL N/A 06/20/2018   Procedure: ESOPHAGOGASTRODUODENOSCOPY (EGD) WITH PROPOFOL;  Surgeon: Lin Landsman, MD;  Location: [facility] ENDOSCOPY;  Service: Gastroenterology;  Laterality: N/A;  EGD/ with banding ligation   . IR RADIOLOGIST EVAL & MGMT  07/09/2018  . POLYPECTOMY  02/01/2015   Procedure: POLYPECTOMY;  Surgeon: Lucilla Lame, MD;  Location: [place];  Service: Endoscopy;;  . TONSILLECTOMY AND ADENOIDECTOMY       reports that she quit smoking about 33 years ago. Her smoking use included cigarettes. She has a 3.00 pack-year smoking history. She has never used smokeless tobacco. She reports that she does not drink  alcohol and does not use drugs.  Allergies  Allergen Reactions  . Amoxicillin Other (See Comments)    Yeast infections  . Codeine Other (See Comments)    Pt  denies  . Hydrocodone-Acetaminophen Other (See Comments)    Pt denies  . Nitrofurantoin Other (See Comments)  . Nitrofurantoin Monohyd Macro     "Sugar got high"  . Penicillins     Has patient had a PCN reaction causing immediate rash, facial/tongue/throat swelling, SOB or lightheadedness with hypotension: Unknown Has patient had a PCN reaction causing severe rash involving mucus membranes or skin necrosis: Unknown Has patient had a PCN reaction that required hospitalization: Unknown Has patient had a PCN reaction occurring within the last 10 years: Unknown If all of the above answers are "NO", then may proceed with Cephalosporin use.  . Sulfa Antibiotics Other (See Comments)    Family History  Problem Relation Age of Onset  . Dementia Mother   . Alcohol abuse Father   . Cancer Sister        lung  . Lymphoma Sister   . Melanoma Sister   . Lymphoma Sister       Prior to Admission medications   Medication Sig Start Date End Date Taking? Authorizing Provider  albuterol (VENTOLIN HFA) 108 (90 Base) MCG/ACT inhaler Inhale 1-2 puffs into the lungs every 6 (six) hours as needed for wheezing or shortness of breath. 12/05/19   Mar Daring, PA-C  Blood Glucose Monitoring Suppl (ACCU-CHEK AVIVA PLUS) w/Device KIT To check blood sugar daily 03/26/20   Mar Daring, PA-C  ergocalciferol (VITAMIN D2) 50000 units capsule Take 50,000 Units by mouth once a week. Every Friday when remembers 02/01/18   [provider]  FLUZONE HIGH-DOSE QUADRIVALENT 0.7 ML SUSY  01/22/19   [provider]  furosemide (LASIX) 20 MG tablet TAKE ONE TABLET EVERY DAY 02/16/20   Mar Daring, PA-C  glucose blood (ACCU-CHEK AVIVA PLUS) test strip To check blood sugar daily 01/28/20   Mar Daring, PA-C  Lancets (ACCU-CHEK SOFT TOUCH) lancets To check blood sugar daily 12/12/18   Mar Daring, PA-C  Magnesium 200 MG TABS Take 1 tablet by mouth daily.    [provider]  nadolol (CORGARD) 20 MG tablet TAKE ONE-HALF TABLET BY MOUTH EVERY DAY 10/07/19   Mar Daring, PA-C  pantoprazole (PROTONIX) 40 MG tablet TAKE ONE TABLET BY MOUTH TWICE DAILY 02/26/20   Mar Daring, PA-C  spironolactone (ALDACTONE) 100 MG tablet TAKE ONE TABLET BY MOUTH EVERY DAY 03/04/20   Mar Daring, [place]    Physical Exam: Vitals:   05/19/20 2145 05/19/20 2200 05/19/20 2215 05/19/20 2230  BP: 100/61 111/61 107/63 (!) 98/52  Pulse: 73 82 81 88  Resp: 15 15 (!) 21 19  Temp:      TempSrc:      SpO2: 97% 97% 97% 97%  Weight:      Height:         Vitals:   05/19/20 2145 05/19/20 2200 05/19/20 2215 05/19/20 2230  BP: 100/61 111/61 107/63 (!) 98/52  Pulse: 73 82 81 88  Resp: 15 15 (!) 21 19  Temp:      TempSrc:      SpO2: 97% 97% 97% 97%  Weight:      Height:          Constitutional:  Somewhat slow to answer questions but oriented x 3 . Not in any apparent distress HEENT:  Head: Normocephalic and atraumatic.         Eyes: PERLA, EOMI, Conjunctivae are normal. Sclera is non-icteric.       Mouth/Throat: Mucous membranes are moist.       Neck: Supple with no signs of meningismus. Cardiovascular: Regular rate and rhythm. No murmurs, gallops, or rubs. 2+ symmetrical distal pulses are present . No JVD. No LE edema Respiratory: Respiratory effort normal .Lungs sounds clear bilaterally. No wheezes, crackles, or rhonchi.  Gastrointestinal: Soft, tender on palpation in RUQ and non distended with positive bowel sounds.  Genitourinary: No CVA tenderness. Musculoskeletal: Nontender with normal range of motion in all extremities. No cyanosis, or erythema of extremities. Neurologic:  Face is symmetric. Moving all extremities. No gross focal neurologic deficits . Skin: Skin is warm, dry.  No rash or ulcers Psychiatric: Mood and affect are normal    Labs on Admission: I have personally reviewed following labs and imaging  studies  CBC: Recent Labs  Lab 05/19/20 1751  WBC 5.2  HGB 12.1  HCT 32.3*  MCV 83.9  PLT 72*   Basic Metabolic Panel: Recent Labs  Lab 05/19/20 1751 05/19/20 1947  NA 107* 108*  K 3.2* 3.7  CL 72* 76*  CO2 21* 19*  GLUCOSE 152* 171*  BUN <5* 5*  CREATININE 0.64 0.70  CALCIUM 8.6* 8.6*   GFR: Estimated Creatinine Clearance: 54.1 mL/min (by C-G formula based on SCr of 0.7 mg/dL). Liver Function Tests: Recent Labs  Lab 05/19/20 1751  AST 38  ALT 17  ALKPHOS 73  BILITOT 2.0*  PROT 6.7  ALBUMIN 3.4*   Recent Labs  Lab 05/19/20 1751  LIPASE 41   No results for input(s): AMMONIA in the last 168 hours. Coagulation Profile: No results for input(s): INR, PROTIME in the last 168 hours. Cardiac Enzymes: No results for input(s): CKTOTAL, CKMB, CKMBINDEX, TROPONINI in the last 168 hours. BNP (last 3 results) No results for input(s): PROBNP in the last 8760 hours. HbA1C: No results for input(s): HGBA1C in the last 72 hours. CBG: No results for input(s): GLUCAP in the last 168 hours. Lipid Profile: No results for input(s): CHOL, HDL, LDLCALC, TRIG, CHOLHDL, LDLDIRECT in the last 72 hours. Thyroid Function Tests: No results for input(s): TSH, T4TOTAL, FREET4, T3FREE, THYROIDAB in the last 72 hours. Anemia Panel: No results for input(s): VITAMINB12, FOLATE, FERRITIN, TIBC, IRON, RETICCTPCT in the last 72 hours. Urine analysis:    Component Value Date/Time   BILIRUBINUR negative 01/16/2017 0922   PROTEINUR negative 01/16/2017 0922   UROBILINOGEN 0.2 01/16/2017 0922   NITRITE negative 01/16/2017 0922   LEUKOCYTESUR Moderate (2+) (A) 01/16/2017 0922    Radiological Exams on Admission: US Abdomen Limited RUQ (LIVER/GB)  Result Date: 05/19/2020 CLINICAL DATA:  Cholelithiasis EXAM: ULTRASOUND ABDOMEN LIMITED RIGHT UPPER QUADRANT COMPARISON:  CT 05/14/2020 FINDINGS: Gallbladder: 1.2 cm stone noted within the gallbladder neck. Sludge within the gallbladder. No wall  thickening. No reported sonographic Murphy sign. Common bile duct: Diameter: Upper limits normal in diameter, 7 mm. Distal duct is not visualized due to overlying bowel gas. Liver: Coarsened, increased echotexture throughout the liver. Nodular contours and shrunken appearance. Findings compatible with cirrhosis. No flow seen within the portal vein concerning for portal venous thrombosis. Other: Moderate to large volume ascites. IMPRESSION: Changes of cirrhosis with nodular shrunken liver and associated ascites. No flow seen within the portal vein concerning for portal venous thrombosis. Cholelithiasis.  No sonographic evidence of acute cholecystitis. Electronically Signed   By: Rolm Baptise  M.D.   On: 05/19/2020 20:18     Assessment/Plan 80 year old female with history of diabetes and [name] and chronic hyponatremia who presents to the emergency room with right upper quadrant pain  Acute on chronic hyponatremia - Possibly hypovolemic related to cirrhosis - Sodium 107, remaining flat at 108 following hypertonic saline.  Baseline sodium 1 27-1 30 over the past few months - Follow sodium osmolality and urine osmolality and urine sodium - Hypertonic saline per order set with close monitoring of serum sodium - Nephrology consult  Right upper quadrant pain, biliary colic - No evidence of cholecystitis on sonogram - 1.2 cm stone within the gallbladder neck with no sonographic Murphy sign, CBD at upper limits at 7 mm - Liver enzymes normal.  Follow-up lipase - GI consult if worsening - Patient states she would like to be evaluated to have her gallbladder removed.    Type 2 diabetes mellitus with unspecified complications (HCC) - Sliding scale insulin coverage    Liver cirrhosis secondary to NASH ([place]) - No acute disease    Essential hypertension, benign - Continue home meds    DVT prophylaxis: Lovenox  Code Status: full code  Family Communication:  none  Disposition Plan: Back to previous  home environment Consults called: none  Status:At the time of admission, it appears that the appropriate admission status for this patient is INPATIENT. This is judged to be reasonable and necessary in order to provide the required intensity of service to ensure the patient's safety given the presenting symptoms, physical exam findings, and initial radiographic and laboratory data in the context of their  Comorbid conditions.   Patient requires inpatient status due to high intensity of service, high risk for further deterioration and high frequency of surveillance required.   I certify that at the point of admission it is my clinical judgment that the patient will require inpatient hospital care spanning beyond [place] MD [facility]     05/19/2020, 11:30 PM

## 2020-05-19 NOTE — Telephone Encounter (Signed)
NA-ok for [facility] nurse to give providers message

## 2020-05-19 NOTE — ED Notes (Signed)
Mepilex placed on pt

## 2020-05-19 NOTE — Progress Notes (Signed)
PHARMACIST - PHYSICIAN COMMUNICATION  CONCERNING:  Enoxaparin (Lovenox) for DVT Prophylaxis    RECOMMENDATION: Patient was prescribed enoxaprin 23m q24 hours for VTE prophylaxis.   Filed Weights   05/19/20 1749  Weight: 81 kg (178 lb 9.2 oz)    Body mass index is 33.74 kg/m.  Estimated Creatinine Clearance: 54.1 mL/min (by C-G formula based on SCr of 0.7 mg/dL).   Based on [place] candidate for enoxaparin 0.514mkg TBW SQ every 24 hours based on BMI being >30.  DESCRIPTION: [facility]  Patient is now receiving enoxaparin 0.5 mg/kg every 24 hours   NaRenda RollsPharmD, MBUniversity Medical Center/04/2021 11:30 PM

## 2020-05-20 DIAGNOSIS — E871 Hypo-osmolality and hyponatremia: Secondary | ICD-10-CM | POA: Diagnosis not present

## 2020-05-20 LAB — CBC
HCT: 28.9 % — ABNORMAL LOW (ref 36.0–46.0)
Hemoglobin: 10.8 g/dL — ABNORMAL LOW (ref 12.0–15.0)
MCH: 31.1 pg (ref 26.0–34.0)
MCHC: 37.4 g/dL — ABNORMAL HIGH (ref 30.0–36.0)
MCV: 83.3 fL (ref 80.0–100.0)
Platelets: 57 10*3/uL — ABNORMAL LOW (ref 150–400)
RBC: 3.47 MIL/uL — ABNORMAL LOW (ref 3.87–5.11)
RDW: 14.1 % (ref 11.5–15.5)
WBC: 6.4 10*3/uL (ref 4.0–10.5)
nRBC: 0 % (ref 0.0–0.2)

## 2020-05-20 LAB — BASIC METABOLIC PANEL
Anion gap: 10 (ref 5–15)
Anion gap: 8 (ref 5–15)
Anion gap: 11 (ref 5–15)
Anion gap: 9 (ref 5–15)
BUN: 5 mg/dL — ABNORMAL LOW (ref 8–23)
BUN: 6 mg/dL — ABNORMAL LOW (ref 8–23)
BUN: 6 mg/dL — ABNORMAL LOW (ref 8–23)
BUN: 7 mg/dL — ABNORMAL LOW (ref 8–23)
CO2: 22 mmol/L (ref 22–32)
CO2: 25 mmol/L (ref 22–32)
CO2: 25 mmol/L (ref 22–32)
CO2: 25 mmol/L (ref 22–32)
Calcium: 8 mg/dL — ABNORMAL LOW (ref 8.9–10.3)
Calcium: 8.1 mg/dL — ABNORMAL LOW (ref 8.9–10.3)
Calcium: 8.1 mg/dL — ABNORMAL LOW (ref 8.9–10.3)
Calcium: 8 mg/dL — ABNORMAL LOW (ref 8.9–10.3)
Chloride: 78 mmol/L — ABNORMAL LOW (ref 98–111)
Chloride: 83 mmol/L — ABNORMAL LOW (ref 98–111)
Chloride: 81 mmol/L — ABNORMAL LOW (ref 98–111)
Chloride: 84 mmol/L — ABNORMAL LOW (ref 98–111)
Creatinine, Ser: 0.58 mg/dL (ref 0.44–1.00)
Creatinine, Ser: 0.7 mg/dL (ref 0.44–1.00)
Creatinine, Ser: 0.72 mg/dL (ref 0.44–1.00)
Creatinine, Ser: 0.71 mg/dL (ref 0.44–1.00)
GFR, Estimated: >60 mL/min (ref >60)
GFR, Estimated: >60 mL/min (ref >60)
GFR, Estimated: >60 mL/min (ref >60)
GFR, Estimated: >60 mL/min (ref >60)
Glucose, Bld: 151 mg/dL — ABNORMAL HIGH (ref 70–99)
Glucose, Bld: 135 mg/dL — ABNORMAL HIGH (ref 70–99)
Glucose, Bld: 147 mg/dL — ABNORMAL HIGH (ref 70–99)
Glucose, Bld: 97 mg/dL (ref 70–99)
Potassium: 3.7 mmol/L (ref 3.5–5.1)
Potassium: 3.9 mmol/L (ref 3.5–5.1)
Potassium: 4 mmol/L (ref 3.5–5.1)
Potassium: 3.8 mmol/L (ref 3.5–5.1)
Sodium: 110 mmol/L — CL (ref 135–145)
Sodium: 116 mmol/L — CL (ref 135–145)
Sodium: 117 mmol/L — CL (ref 135–145)
Sodium: 118 mmol/L — CL (ref 135–145)

## 2020-05-20 LAB — HEMOGLOBIN A1C
Hgb A1c MFr Bld: 5.7 % — ABNORMAL HIGH (ref 4.8–5.6)
Mean Plasma Glucose: 116.89 mg/dL

## 2020-05-20 LAB — OSMOLALITY: Osmolality: 232 mOsm/kg — CL (ref 275–295)

## 2020-05-20 LAB — SARS CORONAVIRUS 2 (TAT 6-24 HRS): SARS Coronavirus 2: NEGATIVE

## 2020-05-20 LAB — SODIUM: Sodium: 112 mmol/L — CL (ref 135–145)

## 2020-05-20 LAB — CORTISOL-AM, BLOOD: Cortisol - AM: 19.6 ug/dL (ref 6.7–22.6)

## 2020-05-20 LAB — URINALYSIS, COMPLETE (UACMP) WITH MICROSCOPIC
Bilirubin Urine: NEGATIVE
Glucose, UA: NEGATIVE mg/dL
Ketones, ur: NEGATIVE mg/dL
Nitrite: NEGATIVE
Protein, ur: 30 mg/dL — AB
Specific Gravity, Urine: 1.014 (ref 1.005–1.030)
WBC, UA: >50 WBC/hpf — ABNORMAL HIGH (ref 0–5)
pH: 8 (ref 5.0–8.0)

## 2020-05-20 LAB — CBG MONITORING, ED
Glucose-Capillary: 110 mg/dL — ABNORMAL HIGH (ref 70–99)
Glucose-Capillary: 118 mg/dL — ABNORMAL HIGH (ref 70–99)
Glucose-Capillary: 150 mg/dL — ABNORMAL HIGH (ref 70–99)
Glucose-Capillary: 163 mg/dL — ABNORMAL HIGH (ref 70–99)
Glucose-Capillary: 94 mg/dL (ref 70–99)

## 2020-05-20 LAB — OSMOLALITY, URINE: Osmolality, Ur: 381 mOsm/kg (ref 300–900)

## 2020-05-20 LAB — SODIUM, URINE, RANDOM: Sodium, Ur: 12 mmol/L

## 2020-05-20 LAB — TSH: TSH: 1.067 u[IU]/mL (ref 0.350–4.500)

## 2020-05-20 MED ORDER — ALBUTEROL SULFATE HFA 108 (90 BASE) MCG/ACT IN AERS
1.0000 | INHALATION_SPRAY | Freq: Four times a day (QID) | RESPIRATORY_TRACT | Status: DC | PRN
  Administered 2020-05-21: 2 via RESPIRATORY_TRACT
  Filled 2020-05-20 (×4): qty 6.7

## 2020-05-20 MED ORDER — PANTOPRAZOLE SODIUM 40 MG PO TBEC
40.0000 mg | DELAYED_RELEASE_TABLET | Freq: Two times a day (BID) | ORAL | Status: DC
  Administered 2020-05-20 – 2020-05-24 (×9): 40 mg via ORAL
  Filled 2020-05-20 (×9): qty 1

## 2020-05-20 MED ORDER — SODIUM CHLORIDE 0.9 % IV SOLN
1.0000 g | INTRAVENOUS | Status: DC
  Administered 2020-05-20 – 2020-05-21 (×2): 1 g via INTRAVENOUS
  Filled 2020-05-20 (×2): qty 10

## 2020-05-20 MED ORDER — ENSURE ENLIVE PO LIQD
237.0000 mL | Freq: Two times a day (BID) | ORAL | Status: DC
  Administered 2020-05-21 – 2020-05-22 (×4): 237 mL via ORAL

## 2020-05-20 NOTE — ED Notes (Signed)
Dr Argie Ramming (on-call hospitalist) notified via secure chat of patient current critical Na+ 110 and critical serum osmolality 232 - no new orders at this time

## 2020-05-20 NOTE — ED Notes (Signed)
Pt given meal tray.

## 2020-05-20 NOTE — ED Notes (Signed)
Per dr Reesa Chew, stop hypertonic saline pending next BMP

## 2020-05-20 NOTE — ED Notes (Signed)
Pt given breakfast tray

## 2020-05-20 NOTE — ED Notes (Signed)
Per Dr Reesa Chew, patient will not go back on hypertonic sodium.  She can have Gatorade or Ensure, no free water. Pa

## 2020-05-20 NOTE — ED Notes (Signed)
Pt awake and alert lying in bed-- oriented to person, place and year.  Speech is soft and delayed but clear.  No acute distress noted.

## 2020-05-20 NOTE — ED Notes (Signed)
Large bowel movement, Patient cleaned and diaper and pure wick changed

## 2020-05-20 NOTE — Telephone Encounter (Signed)
FYI

## 2020-05-20 NOTE — Consult Note (Signed)
[address] [place], [place] [address] Phone [phone]. Fax [phone]  Date: 05/20/2020                  Patient Name:  Jennifer [name]  MRN: [id number]  DOB: 10-05-1940  Age / Sex: 80 y.o., female         PCP: Mar Daring, PA-C                 Service Requesting Consult: IM/ Lorella Nimrod, MD                 Reason for Consult: Hyponatremia            History of Present Illness: Patient is a 80 y.o. female  was admitted to [facility] on 05/19/2020  Patient presented to the emergency room with a 2-day history of right upper quadrant pain and nausea.  She has not been able to eat much and has been drinking clear liquids and water.  She states she was diagnosed with gallbladder problems last month.  She is undergoing evaluation for cholecystectomy. At home she reported that she had an episode of shaking.  Her son thought she was having seizures.  Patient did not have loss of consciousness.  No bowel or urinary incontinence.  She also felt that her left hand was cold.  She reports dysuria recently and thought she had a UTI. In the ER upon presentation her sodium was 107.  She was placed on 3% saline which resulted in improvement of sodium to 116   Medications: Outpatient medications: (Not in a hospital admission)   Current medications: Current Facility-Administered Medications  Medication Dose Route Frequency Provider Last Rate Last Admin  . acetaminophen (TYLENOL) tablet 650 mg  650 mg Oral Q6H PRN Athena Masse, MD       Or  . acetaminophen (TYLENOL) suppository 650 mg  650 mg Rectal Q6H PRN Athena Masse, MD      . albuterol (VENTOLIN HFA) 108 (90 Base) MCG/ACT inhaler 1-2 puff  1-2 puff Inhalation Q6H PRN Lorella Nimrod, MD      . cefTRIAXone (ROCEPHIN) 1 g in sodium chloride 0.9 % 100 mL IVPB  1 g Intravenous Q24H Lorella Nimrod, MD   Stopped at 05/20/20 1439  . enoxaparin (LOVENOX) injection 40 mg  0.5 mg/kg Subcutaneous Q24H Renda Rolls, RPH   40  mg at 05/20/20 0015  . feeding supplement (ENSURE ENLIVE / ENSURE PLUS) liquid 237 mL  237 mL Oral BID BM Amin, Soundra Pilon, MD      . insulin aspart (novoLOG) injection 0-15 Units  0-15 Units Subcutaneous TID WC Judd Gaudier V, MD      . insulin aspart (novoLOG) injection 0-5 Units  0-5 Units Subcutaneous QHS Judd Gaudier V, MD      . ondansetron [facility]) tablet 4 mg  4 mg Oral Q6H PRN Athena Masse, MD       Or  . ondansetron [facility]) injection 4 mg  4 mg Intravenous Q6H PRN Athena Masse, MD   4 mg at 05/20/20 0700  . pantoprazole (PROTONIX) EC tablet 40 mg  40 mg Oral BID Lorella Nimrod, MD   40 mg at 05/20/20 1335  . sodium chloride (hypertonic) 3 % solution   Intravenous Continuous Athena Masse, MD   Stopped at 05/20/20 1245   Current Outpatient Medications  Medication Sig Dispense Refill  . albuterol (VENTOLIN HFA) 108 (90 Base) MCG/ACT inhaler Inhale 1-2 puffs into the  lungs every 6 (six) hours as needed for wheezing or shortness of breath. 18 g 1  . Blood Glucose Monitoring Suppl (ACCU-CHEK AVIVA PLUS) w/Device KIT To check blood sugar daily 1 kit 0  . ergocalciferol (VITAMIN D2) 50000 units capsule Take 50,000 Units by mouth once a week. Every Friday when remembers    . FLUZONE HIGH-DOSE QUADRIVALENT 0.7 ML SUSY     . furosemide (LASIX) 20 MG tablet TAKE ONE TABLET EVERY DAY 90 tablet 1  . glucose blood (ACCU-CHEK AVIVA PLUS) test strip To check blood sugar daily 100 each 3  . Lancets (ACCU-CHEK SOFT TOUCH) lancets To check blood sugar daily 100 each 12  . Magnesium 200 MG TABS Take 1 tablet by mouth daily.    . nadolol (CORGARD) 20 MG tablet TAKE ONE-HALF TABLET BY MOUTH EVERY DAY 45 tablet 3  . pantoprazole (PROTONIX) 40 MG tablet TAKE ONE TABLET BY MOUTH TWICE DAILY 180 tablet 0  . spironolactone (ALDACTONE) 100 MG tablet TAKE ONE TABLET BY MOUTH EVERY DAY 90 tablet 1      Allergies: Allergies  Allergen Reactions  . Amoxicillin Other (See Comments)    Yeast infections   . Codeine Other (See Comments)    Pt denies  . Hydrocodone-Acetaminophen Other (See Comments)    Pt denies  . Nitrofurantoin Other (See Comments)  . Nitrofurantoin Monohyd Macro     "Sugar got high"  . Penicillins     Has patient had a PCN reaction causing immediate rash, facial/tongue/throat swelling, SOB or lightheadedness with hypotension: Unknown Has patient had a PCN reaction causing severe rash involving mucus membranes or skin necrosis: Unknown Has patient had a PCN reaction that required hospitalization: Unknown Has patient had a PCN reaction occurring within the last 10 years: Unknown If all of the above answers are "NO", then may proceed with Cephalosporin use.  . Sulfa Antibiotics Other (See Comments)      Past Medical History: Past Medical History:  Diagnosis Date  . Allergy   . Arthritis   . Asthma   . Colon polyp   . Fatty liver   . GERD (gastroesophageal reflux disease)   . Hyperlipidemia   . Hypertension   . Motion sickness    boats     Past Surgical History: Past Surgical History:  Procedure Laterality Date  . ABDOMINAL HYSTERECTOMY  1978  . COLONOSCOPY WITH PROPOFOL N/A 02/01/2015   Procedure: COLONOSCOPY WITH PROPOFOL;  Surgeon: Lucilla Lame, MD;  Location: [place];  Service: Endoscopy;  Laterality: N/A;  Diabetic - oral meds  . COLONOSCOPY WITH PROPOFOL N/A 11/22/2017   Procedure: COLONOSCOPY WITH PROPOFOL;  Surgeon: Lin Landsman, MD;  Location[facility] ENDOSCOPY;  Service: Gastroenterology;  Laterality: N/A;  . ESOPHAGOGASTRODUODENOSCOPY (EGD) WITH PROPOFOL N/A 07/12/2017   Procedure: ESOPHAGOGASTRODUODENOSCOPY (EGD) WITH PROPOFOL;  Surgeon: Lin Landsman, MD;  Location: [facility] ENDOSCOPY;  Service: Gastroenterology;  Laterality: N/A;  . ESOPHAGOGASTRODUODENOSCOPY (EGD) WITH PROPOFOL N/A 08/16/2017   Procedure: ESOPHAGOGASTRODUODENOSCOPY (EGD) WITH PROPOFOL;  Surgeon: Lin Landsman, MD;  Location: [facility] ENDOSCOPY;  Service:  Gastroenterology;  Laterality: N/A;  . ESOPHAGOGASTRODUODENOSCOPY (EGD) WITH PROPOFOL N/A 09/20/2017   Procedure: ESOPHAGOGASTRODUODENOSCOPY (EGD) WITH PROPOFOL;  Surgeon: Lin Landsman, MD;  Location[facility] ENDOSCOPY;  Service: Gastroenterology;  Laterality: N/A;  . ESOPHAGOGASTRODUODENOSCOPY (EGD) WITH PROPOFOL N/A 11/01/2017   Procedure: ESOPHAGOGASTRODUODENOSCOPY (EGD) WITH PROPOFOL;  Surgeon: Lin Landsman, MD;  Location: [facility] ENDOSCOPY;  Service: Gastroenterology;  Laterality: N/A;  . ESOPHAGOGASTRODUODENOSCOPY (EGD) WITH PROPOFOL N/A  01/17/2018   Procedure: ESOPHAGOGASTRODUODENOSCOPY (EGD) WITH PROPOFOL;  Surgeon: Lin Landsman, MD;  Location: [facility] ENDOSCOPY;  Service: Gastroenterology;  Laterality: N/A;  . ESOPHAGOGASTRODUODENOSCOPY (EGD) WITH PROPOFOL N/A 06/20/2018   Procedure: ESOPHAGOGASTRODUODENOSCOPY (EGD) WITH PROPOFOL;  Surgeon: Lin Landsman, MD;  Location: [facility] ENDOSCOPY;  Service: Gastroenterology;  Laterality: N/A;  EGD/ with banding ligation   . IR RADIOLOGIST EVAL & MGMT  07/09/2018  . POLYPECTOMY  02/01/2015   Procedure: POLYPECTOMY;  Surgeon: Lucilla Lame, MD;  Location: [place];  Service: Endoscopy;;  . TONSILLECTOMY AND ADENOIDECTOMY       Family History: Family History  Problem Relation Age of Onset  . Dementia Mother   . Alcohol abuse Father   . Cancer Sister        lung  . Lymphoma Sister   . Melanoma Sister   . Lymphoma Sister      Social History: Social History   Socioeconomic History  . Marital status: Widowed    Spouse name: Not on file  . Number of children: 2  . Years of education: Not on file  . Highest education level: Some college, no degree  Occupational History  . Occupation: retired  Tobacco Use  . Smoking status: Former Smoker    Packs/day: 0.50    Years: 6.00    Pack years: 3.00    Types: Cigarettes    Quit date: 05/08/1987    Years since quitting: 33.0  . Smokeless tobacco: Never Used  Vaping Use   . Vaping Use: Never used  Substance and Sexual Activity  . Alcohol use: Never  . Drug use: Never  . Sexual activity: Not on file  Other Topics Concern  . Not on file  Social History Narrative  . Not on file   Social Determinants of Health   Financial Resource Strain: Low Risk   . Difficulty of Paying Living Expenses: Not hard at all  Food Insecurity: No Food Insecurity  . Worried About Charity fundraiser in the Last Year: Never true  . Ran Out of Food in the Last Year: Never true  Transportation Needs: No Transportation Needs  . Lack of Transportation (Medical): No  . Lack of Transportation (Non-Medical): No  Physical Activity: Inactive  . Days of Exercise per Week: 0 days  . Minutes of Exercise per Session: 0 min  Stress: No Stress Concern Present  . Feeling of Stress : Not at all  Social Connections: Moderately Isolated  . Frequency of Communication with Friends and Family: More than three times a week  . Frequency of Social Gatherings with Friends and Family: More than three times a week  . Attends Religious Services: More than 4 times per year  . Active Member of Clubs or Organizations: No  . Attends Archivist Meetings: Never  . Marital Status: Widowed  Intimate Partner Violence: Not At Risk  . Fear of Current or Ex-Partner: No  . Emotionally Abused: No  . Physically Abused: No  . Sexually Abused: No     Review of Systems: Gen:  HEENT: no vision or hearing c/o CV: no chest pain or SOB. Has chronic edema- takes lasix Resp: no cough or hemoptysis NW:[id number] appetite. Vomiting at home prior to arrival. Asking for Waffles for breakfast GU : dysuria. No hematuria MS: no c/o Derm:   no c/o Psych: No complaints Heme: No complaints Neuro: No complaints at present.  Thought she had seizures prior to arrival Endocrine.  No complaints  Vital  Signs: Blood pressure (!) 93/53, pulse 78, temperature 97.6 F (36.4 C), temperature source Oral, resp. rate (!)  24, height _0  (1.549 m), weight 81 kg, SpO2 97 %.   Intake/Output Summary (Last 24 hours) at 05/20/2020 1500 Last data filed at 05/20/2020 0915 Gross per 24 hour  Intake 447.91 ml  Output --  Net 447.91 ml    Weight trends: [employer]   05/19/20 1749  Weight: 81 kg   Physical Exam: General:  No acute distress, laying in the bed  HEENT  anicteric, moist oral mucous membrane  Pulm/lungs  normal breathing effort, lungs are clear to auscultation  CVS/Heart  regular rhythm, no rub or gallop  Abdomen:   Soft, nontender  Extremities: + peripheral edema  Neurologic:  Alert, oriented, able to follow commands  Skin:  No acute rashes      Lab results: Basic Metabolic Panel: Recent Labs  Lab 05/19/20 1947 05/19/20 2336 05/20/20 0526 05/20/20 1058  NA 108* 110* 112* 116*  K 3.7 3.7  --  3.9  CL 76* 78*  --  83*  CO2 19* 22  --  25  GLUCOSE 171* 151*  --  135*  BUN 5* 5*  --  6*  CREATININE 0.70 0.58  --  0.70  CALCIUM 8.6* 8.0*  --  8.1*    Liver Function Tests: Recent Labs  Lab 05/19/20 1751  AST 38  ALT 17  ALKPHOS 73  BILITOT 2.0*  PROT 6.7  ALBUMIN 3.4*   Recent Labs  Lab 05/19/20 1751  LIPASE 41   No results for input(s): AMMONIA in the last 168 hours.  CBC: Recent Labs  Lab 05/19/20 1751 05/19/20 2336  WBC 5.2 6.4  HGB 12.1 10.8*  HCT 32.3* 28.9*  MCV 83.9 83.3  PLT 72* 57*    Cardiac Enzymes: No results for input(s): CKTOTAL, TROPONINI in the last 168 hours.  BNP: Invalid input(s): POCBNP  CBG: Recent Labs  Lab 05/19/20 2341 05/20/20 0737 05/20/20 1203  GLUCAP 150* 110* 118*    Microbiology: Recent Results (from the past 720 hour(s))  SARS CORONAVIRUS 2 (TAT 6-24 HRS) Nasopharyngeal Nasopharyngeal Swab     Status: None   Collection Time: 05/20/20  7:28 AM   Specimen: Nasopharyngeal Swab  Result Value Ref Range Status   SARS Coronavirus 2 NEGATIVE NEGATIVE Final    Comment: (NOTE) SARS-CoV-2 target nucleic acids are NOT  DETECTED.  The SARS-CoV-2 RNA is generally detectable in upper and lower respiratory specimens during the acute phase of infection. Negative results do not preclude SARS-CoV-2 infection, do not rule out co-infections with other pathogens, and should not be used as the sole basis for treatment or other patient management decisions. Negative results must be combined with clinical observations, patient history, and epidemiological information. The expected result is Negative.  Fact Sheet for Patients: SugarRoll.be  Fact Sheet for Healthcare Providers: https://www.woods-mathews.com/  This test is not yet approved or cleared by the [place] FDA and  has been authorized for detection and/or diagnosis of SARS-CoV-2 by FDA under an Emergency Use Authorization (EUA). This EUA will remain  in effect (meaning this test can be used) for the duration of the COVID-19 declaration under Se ction 564(b)(1) of the Act, 21 U.S.C. section 360bbb-3(b)(1), unless the authorization is terminated or revoked sooner.  Performed at [facility], [place] [address] [place], [place] [address]      Coagulation Studies: No results for input(s): LABPROT, INR in the last 72  hours.  Urinalysis: Recent Labs    05/20/20 0400  COLORURINE AMBER*  LABSPEC 1.014  PHURINE 8.0  GLUCOSEU NEGATIVE  HGBUR SMALL*  BILIRUBINUR NEGATIVE  KETONESUR NEGATIVE  PROTEINUR 30*  NITRITE NEGATIVE  LEUKOCYTESUR LARGE*        Imaging: US Abdomen Limited RUQ (LIVER/GB)  Result Date: 05/19/2020 CLINICAL DATA:  Cholelithiasis EXAM: ULTRASOUND ABDOMEN LIMITED RIGHT UPPER QUADRANT COMPARISON:  CT 05/14/2020 FINDINGS: Gallbladder: 1.2 cm stone noted within the gallbladder neck. Sludge within the gallbladder. No wall thickening. No reported sonographic Murphy sign. Common bile duct: Diameter: Upper limits normal in diameter, 7 mm. Distal duct is not visualized due to overlying  bowel gas. Liver: Coarsened, increased echotexture throughout the liver. Nodular contours and shrunken appearance. Findings compatible with cirrhosis. No flow seen within the portal vein concerning for portal venous thrombosis. Other: Moderate to large volume ascites. IMPRESSION: Changes of cirrhosis with nodular shrunken liver and associated ascites. No flow seen within the portal vein concerning for portal venous thrombosis. Cholelithiasis.  No sonographic evidence of acute cholecystitis. Electronically Signed   By: Rolm Baptise M.D.   On: 05/19/2020 20:18      Assessment & Plan: Pt is a 80 y.o.   female with diabetes, cirrhosis, chronic hyponatremia secondary to [name], Hypertension was admitted on 05/19/2020 with Hyponatremia [E87.1]   # Hyponatremia # LE edema Hyponatremia appears to be chronic with sodium ranging from 1 29-1 33.  Most recently sodium of 127 on April 23, 2020. Since then sodium level has been decreasing and was 107 at admission.  Patient has been taking furosemide at home for lower extremity edema.  She also reports that oral intake has been poor except for water. A.m. cortisol 19.6 May 20, 2020 TSH normal at 1.067 Urinalysis with specific gravity 1.014, greater than 50 WBCs, urine sodium 12, urine osmolarity 381  Treated with 3% saline overnight with improvement in sodium to 116  Chronic hyponatremia seems to be secondary to nonalcoholic cirrhosis with baseline of 129-133.  Worsening of sodium appears to be secondary to poor oral intake and concurrent use of Lasix.  Recommend Encourage oral intake including high-protein diet Hold Lasix  monitor Na closely Goal for correction: 122 for Friday AM  #Diabetes Lab Results  Component Value Date   HGBA1C 6.8 (H) 12/05/2019       LOS: 1 Hillman Attig 1/13/20223:00 PM    Note: This note was prepared with Dragon dictation. Any transcription errors are unintentional

## 2020-05-20 NOTE — Telephone Encounter (Signed)
Called Pt to relay information that Pt will need to have urine tested prior to antibiotic prescription order.  Spoke with pt's daughter [name]. [name] states that the pt. Has been at [facility] ED since yesterday for pain and low sodium levels.  Pt may be admitted to [facility] for gall bladder Sx.  Pt has an appointment on Monday 05/24/2020. Pt's daughter will cancel if unable to keep.

## 2020-05-20 NOTE — ED Notes (Signed)
Attempted to call patient daughter Clenton Pare ([phone]) however no answer and was unable to leave a message

## 2020-05-20 NOTE — ED Notes (Signed)
Pt has family at bedside. Asking about breakfast tray.

## 2020-05-20 NOTE — Progress Notes (Addendum)
Pharmacy Electrolyte Replacement - Hypertonic Saline  Recent Labs:  Recent Labs    05/19/20 1947  K 3.7  CREATININE 0.70    Low Critical Values Present:  Hypertonic Saline started on 01/13 @ 0009 at 50 ml/hr.  01/12 1947 Na = 108 01/12 2336 Na = 110 01/13 Contacted Lab about 0200 level.  Per lab tech, level was "mis-timed" and appeared to be duplicate and therefore was deleted. Next level scheduled to be drawn at ~0520.  Pharmacy continuing to follow. 01/13 0526 Na = 112, Increase of 2 mEq/L over first 4 hours.   Plan: Pharmacy will continue to follow. Next Na+ level due at ~ [place], PharmD, [facility] 05/20/2020 6:57 AM

## 2020-05-20 NOTE — Progress Notes (Signed)
PROGRESS NOTE    Jennifer [name]  ONG:[id number] DOB: [birth year] DOA: 05/19/2020 PCP: Mar Daring, PA-C   Brief Narrative: Taken from H&P.  Jennifer [name] is a 80 y.o. female with medical history significant for   DM and cirrhosis with chronic hyponatremia secondary to NASH who presents to the emergency room with a 2-day history of right upper quadrant pain off and on for the past 3 weeks associated with nausea .  Pain is about 6 out of 10, colicky, nonradiating, made worse with oral intake.  She denies cough fever or chills and denies chest pain or shortness of breath. On arrival labs significant for sodium of 107, patient has chronic hyponatremia with baseline around 127.  It was 121 2 weeks ago.  Right upper quadrant ultrasound consistent with cirrhosis, no evidence of acute cholecystitis. She was started on hypertonic saline and nephrology was consulted. Hypertonic saline discontinued today when sodium was 116.  UA with pyuria, patient is having increased urinary frequency and dysuria, starting ceftriaxone today after sending culture.  Subjective: Patient seems improving when seen today.  Daughter at bedside.  Alert and oriented.  When asked about urinary symptoms she was having increased urinary frequency and dysuria for the past couple of days stating that she thinks she is having a UTI.  Assessment & Plan:   Principal Problem:   Hyponatremia Active Problems:   Type 2 diabetes mellitus with unspecified complications (HCC)   Liver cirrhosis secondary to NASH [facility])   Essential hypertension, benign   Chronic hyponatremia  Acute on chronic hyponatremia.  Labs consistent with hypoosmolar hyponatremia.  Patient was drinking quite a bit of free water to keep herself hydrated.  She has chronic hyponatremia secondary to cirrhosis, using Lasix, significant lower extremity edema.  Mentation at baseline this morning. Patient was placed on hypertonic saline and sodium improved to 116  this morning. -Stop hypertonic saline. -Restrict free water-she can drink Gatorade and Ensure. -Holding Lasix. -Continue to monitor. -Discussed with nephrology and they are recommending the same.  Right upper quadrant pain.  No pain this morning but she does have intermittent cramping going on in that area. RUQ ultrasound with 1.2 cm stone within the gallbladder neck with no sonographic Murphy sign, CBD dilated at upper limit at 7 mm.  No evidence of cholecystitis. Patient would like to be evaluated for cholecystectomy secondary to cholelithiasis. -We will ask surgery.  UTI.  Urine with pyuria and patient to have urinary symptoms which includes urinary frequency, urgency and dysuria.  No fever or chills. -Check urine culture. -Start her on ceftriaxone.  Type 2 diabetes mellitus with unspecified complications (HCC) - Sliding scale insulin coverage    Liver cirrhosis secondary to NASH ([place]).  Appears compensated. -Continue home spironolactone. -Holding Lasix.  Essential hypertension.  Blood pressure within goal. -Continue spironolactone and monitor.  Objective: Vitals:   05/20/20 1200 05/20/20 1215 05/20/20 1230 05/20/20 1245  BP: (!) 100/55 103/60 100/62 (!) 96/56  Pulse: 74 81 81 80  Resp: (!) 22 19 15  (!) 24  Temp:      TempSrc:      SpO2: 97% 96% 99% 98%  Weight:      Height:        Intake/Output Summary (Last 24 hours) at 05/20/2020 1314 Last data filed at 05/20/2020 0915 Gross per 24 hour  Intake 447.91 ml  Output --  Net 447.91 ml   Filed Weights   05/19/20 1749  Weight: 81 kg    Examination:  General exam: Appears calm and comfortable  Respiratory system: Clear to auscultation. Respiratory effort normal. Cardiovascular system: S1 & S2 heard, RRR.  Gastrointestinal system: Soft, nontender, nondistended, bowel sounds positive. Central nervous system: Alert and oriented. No focal neurological deficits.Symmetric 5 x 5 power. Extremities: 2+ LE edema, no  cyanosis, pulses intact and symmetrical. Psychiatry: Judgement and insight appear normal. Mood & affect appropriate.    DVT prophylaxis: Lovenox Code Status: Full Family Communication: Daughter was updated at bedside. Disposition Plan:  Status is: Inpatient  Remains inpatient appropriate because:Inpatient level of care appropriate due to severity of illness   Dispo: The patient is from: Home              Anticipated d/c is to: Home              Anticipated d/c date is: 1 day              Patient currently is not medically stable to d/c.  Consultants:   Nephrology  General surgery  Procedures:  Antimicrobials: Rocephin  Data Reviewed: I have personally reviewed following labs and imaging studies  CBC: Recent Labs  Lab 05/19/20 1751 05/19/20 2336  WBC 5.2 6.4  HGB 12.1 10.8*  HCT 32.3* 28.9*  MCV 83.9 83.3  PLT 72* 57*   Basic Metabolic Panel: Recent Labs  Lab 05/19/20 1751 05/19/20 1947 05/19/20 2336 05/20/20 0526 05/20/20 1058  NA 107* 108* 110* 112* 116*  K 3.2* 3.7 3.7  --  3.9  CL 72* 76* 78*  --  83*  CO2 21* 19* 22  --  25  GLUCOSE 152* 171* 151*  --  135*  BUN <5* 5* 5*  --  6*  CREATININE 0.64 0.70 0.58  --  0.70  CALCIUM 8.6* 8.6* 8.0*  --  8.1*   GFR: Estimated Creatinine Clearance: 54.1 mL/min (by C-G formula based on SCr of 0.7 mg/dL). Liver Function Tests: Recent Labs  Lab 05/19/20 1751  AST 38  ALT 17  ALKPHOS 73  BILITOT 2.0*  PROT 6.7  ALBUMIN 3.4*   Recent Labs  Lab 05/19/20 1751  LIPASE 41   No results for input(s): AMMONIA in the last 168 hours. Coagulation Profile: No results for input(s): INR, PROTIME in the last 168 hours. Cardiac Enzymes: No results for input(s): CKTOTAL, CKMB, CKMBINDEX, TROPONINI in the last 168 hours. BNP (last 3 results) No results for input(s): PROBNP in the last 8760 hours. HbA1C: No results for input(s): HGBA1C in the last 72 hours. CBG: Recent Labs  Lab 05/19/20 2341 05/20/20 0737  05/20/20 1203  GLUCAP 150* 110* 118*   Lipid Profile: No results for input(s): CHOL, HDL, LDLCALC, TRIG, CHOLHDL, LDLDIRECT in the last 72 hours. Thyroid Function Tests: Recent Labs    05/20/20 0526  TSH 1.067   Anemia Panel: No results for input(s): VITAMINB12, FOLATE, FERRITIN, TIBC, IRON, RETICCTPCT in the last 72 hours. Sepsis Labs: No results for input(s): PROCALCITON, LATICACIDVEN in the last 168 hours.  No results found for this or any previous visit (from the past 240 hour(s)).   Radiology Studies: US Abdomen Limited RUQ (LIVER/GB)  Result Date: 05/19/2020 CLINICAL DATA:  Cholelithiasis EXAM: ULTRASOUND ABDOMEN LIMITED RIGHT UPPER QUADRANT COMPARISON:  CT 05/14/2020 FINDINGS: Gallbladder: 1.2 cm stone noted within the gallbladder neck. Sludge within the gallbladder. No wall thickening. No reported sonographic Murphy sign. Common bile duct: Diameter: Upper limits normal in diameter, 7 mm. Distal duct is not visualized due to overlying bowel gas. Liver:  Coarsened, increased echotexture throughout the liver. Nodular contours and shrunken appearance. Findings compatible with cirrhosis. No flow seen within the portal vein concerning for portal venous thrombosis. Other: Moderate to large volume ascites. IMPRESSION: Changes of cirrhosis with nodular shrunken liver and associated ascites. No flow seen within the portal vein concerning for portal venous thrombosis. Cholelithiasis.  No sonographic evidence of acute cholecystitis. Electronically Signed   By: Rolm Baptise M.D.   On: 05/19/2020 20:18    Scheduled Meds: . enoxaparin (LOVENOX) injection  0.5 mg/kg Subcutaneous Q24H  . feeding supplement  237 mL Oral BID BM  . insulin aspart  0-15 Units Subcutaneous TID WC  . insulin aspart  0-5 Units Subcutaneous QHS  . pantoprazole  40 mg Oral BID   Continuous Infusions: . cefTRIAXone (ROCEPHIN)  IV    . sodium chloride (hypertonic) Stopped (05/20/20 1245)     LOS: 1 day   Time  spent: 35 minutes.  Lorella Nimrod, MD [facility]  If 7PM-7AM, please contact night-coverage [email or web address]  05/20/2020, 1:14 PM   This record has been created using Systems analyst. Errors have been sought and corrected,but may not always be located. Such creation errors do not reflect on the standard of care.

## 2020-05-21 DIAGNOSIS — E871 Hypo-osmolality and hyponatremia: Secondary | ICD-10-CM | POA: Diagnosis not present

## 2020-05-21 LAB — CBG MONITORING, ED
Glucose-Capillary: 99 mg/dL (ref 70–99)
Glucose-Capillary: 128 mg/dL — ABNORMAL HIGH (ref 70–99)
Glucose-Capillary: 138 mg/dL — ABNORMAL HIGH (ref 70–99)

## 2020-05-21 LAB — BASIC METABOLIC PANEL
Anion gap: 6 (ref 5–15)
Anion gap: 7 (ref 5–15)
BUN: 7 mg/dL — ABNORMAL LOW (ref 8–23)
BUN: 7 mg/dL — ABNORMAL LOW (ref 8–23)
CO2: 26 mmol/L (ref 22–32)
CO2: 25 mmol/L (ref 22–32)
Calcium: 8.4 mg/dL — ABNORMAL LOW (ref 8.9–10.3)
Calcium: 8.5 mg/dL — ABNORMAL LOW (ref 8.9–10.3)
Chloride: 85 mmol/L — ABNORMAL LOW (ref 98–111)
Chloride: 86 mmol/L — ABNORMAL LOW (ref 98–111)
Creatinine, Ser: 0.76 mg/dL (ref 0.44–1.00)
Creatinine, Ser: 0.7 mg/dL (ref 0.44–1.00)
GFR, Estimated: >60 mL/min (ref >60)
GFR, Estimated: >60 mL/min (ref >60)
Glucose, Bld: 99 mg/dL (ref 70–99)
Glucose, Bld: 101 mg/dL — ABNORMAL HIGH (ref 70–99)
Potassium: 3.9 mmol/L (ref 3.5–5.1)
Potassium: 3.9 mmol/L (ref 3.5–5.1)
Sodium: 117 mmol/L — CL (ref 135–145)
Sodium: 118 mmol/L — CL (ref 135–145)

## 2020-05-21 LAB — SODIUM: Sodium: 119 mmol/L — CL (ref 135–145)

## 2020-05-21 MED ORDER — SODIUM CHLORIDE 3 % IV SOLN
INTRAVENOUS | Status: DC
  Filled 2020-05-21 (×5): qty 500

## 2020-05-21 MED ORDER — SODIUM CHLORIDE 1 G PO TABS
1.0000 g | ORAL_TABLET | Freq: Two times a day (BID) | ORAL | Status: DC
  Filled 2020-05-21: qty 1

## 2020-05-21 NOTE — ED Notes (Signed)
Pt resting comfortably at this time. NAD noted. Pt sleeping at this normal rise an fall of chest. Call bell in reach.

## 2020-05-21 NOTE — Progress Notes (Addendum)
PROGRESS NOTE    Jennifer [name]  ZOX:[id number] DOB: 1940/11/28 DOA: 05/19/2020 PCP: Mar Daring, PA-C   Brief Narrative: Taken from H&P.  Jennifer [name] is a 80 y.o. female with medical history significant for   DM and cirrhosis with chronic hyponatremia secondary to NASH who presents to the emergency room with a 2-day history of right upper quadrant pain off and on for the past 3 weeks associated with nausea .  Pain is about 6 out of 10, colicky, nonradiating, made worse with oral intake.  She denies cough fever or chills and denies chest pain or shortness of breath. On arrival labs significant for sodium of 107, patient has chronic hyponatremia with baseline around 127.  It was 121 2 weeks ago.  Right upper quadrant ultrasound consistent with cirrhosis, no evidence of acute cholecystitis. She was started on hypertonic saline and nephrology was consulted. Hypertonic saline discontinued today when sodium was 116.  UA with pyuria, patient is having increased urinary frequency and dysuria, starting ceftriaxone today after sending culture.  Subjective: Patient was feeling little improved when seen today.  Sodium remains low.  Son at bedside.  No other complaints.  Assessment & Plan:   Principal Problem:   Hyponatremia Active Problems:   Type 2 diabetes mellitus with unspecified complications (HCC)   Liver cirrhosis secondary to NASH [facility])   Essential hypertension, benign   Chronic hyponatremia  Acute on chronic hyponatremia.  Labs consistent with hypoosmolar hyponatremia.  Patient was drinking quite a bit of free water to keep herself hydrated.  She has chronic hyponatremia secondary to cirrhosis, using Lasix, significant lower extremity edema.  Mentation at baseline this morning. Patient was placed on hypertonic saline and sodium improved to 116, hypertonic saline was stopped and sodium continue to improve to 118 and then started dropping again. -Restart hypertonic saline at 35  mm/h after discussing with nephrology. -Restrict free water-she can drink Gatorade and Ensure. -Holding Lasix. -Continue to monitor.  Right upper quadrant pain.  No pain this morning but she does have intermittent cramping going on in that area. RUQ ultrasound with 1.2 cm stone within the gallbladder neck with no sonographic Murphy sign, CBD dilated at upper limit at 7 mm.  No evidence of cholecystitis. Patient would like to be evaluated for cholecystectomy secondary to cholelithiasis. -Surgery was consulted and apparently she is not a good candidate for surgery due to her underlying liver condition.  UTI.  Urine with pyuria and patient to have urinary symptoms which includes urinary frequency, urgency and dysuria.  No fever or chills. Urine culture with gram-negative rods. -Follow-up final results. -Continue with ceftriaxone  Type 2 diabetes mellitus with unspecified complications (HCC) - Sliding scale insulin coverage    Liver cirrhosis secondary to NASH ([place]).  Appears compensated. -Continue home spironolactone. -Holding Lasix.  Essential hypertension.  Blood pressure within goal. -Continue spironolactone and monitor.  Objective: Vitals:   05/21/20 0637 05/21/20 0700 05/21/20 1000 05/21/20 1100  BP: (!) 107/59 (!) 112/56 (!) 107/54 (!) 111/59  Pulse: 86 82 85 86  Resp: 13 13 (!) 21 16  Temp:      TempSrc:      SpO2: 95% 93% 99% 100%  Weight:      Height:       No intake or output data in the 24 hours ending 05/21/20 1533 Filed Weights   05/19/20 1749  Weight: 81 kg    Examination:  General.  Pleasant elderly lady, in no acute distress. Pulmonary.  Lungs clear bilaterally, normal respiratory effort. CV.  Regular rate and rhythm, no JVD, rub or murmur. Abdomen.  Soft, nontender, nondistended, BS positive. CNS.  Alert and oriented x3.  No focal neurologic deficit. Extremities.  2+ LE edema, no cyanosis, pulses intact and symmetrical. Psychiatry.  Judgment and  insight appears normal.    DVT prophylaxis: Lovenox Code Status: Full Family Communication: Son was updated at bedside. Disposition Plan:  Status is: Inpatient  Remains inpatient appropriate because:Inpatient level of care appropriate due to severity of illness   Dispo: The patient is from: Home              Anticipated d/c is to: Home              Anticipated d/c date is: 1 day              Patient currently is not medically stable to d/c.  Consultants:   Nephrology  General surgery  Procedures:  Antimicrobials: Rocephin  Data Reviewed: I have personally reviewed following labs and imaging studies  CBC: Recent Labs  Lab 05/19/20 1751 05/19/20 2336  WBC 5.2 6.4  HGB 12.1 10.8*  HCT 32.3* 28.9*  MCV 83.9 83.3  PLT 72* 57*   Basic Metabolic Panel: Recent Labs  Lab 05/20/20 1058 05/20/20 1500 05/20/20 2216 05/21/20 0255 05/21/20 0834  NA 116* 117* 118* 117* 118*  K 3.9 4.0 3.8 3.9 3.9  CL 83* 81* 84* 85* 86*  CO2 25 25 25 26 25   GLUCOSE 135* 147* 97 99 101*  BUN 6* 6* 7* 7* 7*  CREATININE 0.70 0.72 0.71 0.76 0.70  CALCIUM 8.1* 8.1* 8.0* 8.4* 8.5*   GFR: Estimated Creatinine Clearance: 54.1 mL/min (by C-G formula based on SCr of 0.7 mg/dL). Liver Function Tests: Recent Labs  Lab 05/19/20 1751  AST 38  ALT 17  ALKPHOS 73  BILITOT 2.0*  PROT 6.7  ALBUMIN 3.4*   Recent Labs  Lab 05/19/20 1751  LIPASE 41   No results for input(s): AMMONIA in the last 168 hours. Coagulation Profile: No results for input(s): INR, PROTIME in the last 168 hours. Cardiac Enzymes: No results for input(s): CKTOTAL, CKMB, CKMBINDEX, TROPONINI in the last 168 hours. BNP (last 3 results) No results for input(s): PROBNP in the last 8760 hours. HbA1C: Recent Labs    05/19/20 2336  HGBA1C 5.7*   CBG: Recent Labs  Lab 05/20/20 0737 05/20/20 1203 05/20/20 1858 05/20/20 2202 05/21/20 0851  GLUCAP 110* 118* 163* 94 99   Lipid Profile: No results for input(s):  CHOL, HDL, LDLCALC, TRIG, CHOLHDL, LDLDIRECT in the last 72 hours. Thyroid Function Tests: Recent Labs    05/20/20 0526  TSH 1.067   Anemia Panel: No results for input(s): VITAMINB12, FOLATE, FERRITIN, TIBC, IRON, RETICCTPCT in the last 72 hours. Sepsis Labs: No results for input(s): PROCALCITON, LATICACIDVEN in the last 168 hours.  Recent Results (from the past 240 hour(s))  Urine Culture     Status: Abnormal (Preliminary result)   Collection Time: 05/19/20 10:36 PM   Specimen: Urine, Clean Catch  Result Value Ref Range Status   Specimen Description   Final    URINE, CLEAN CATCH Performed at [facility], [address], [place], [place] [address]    Special Requests   Final    NONE Performed at [facility], [address] [place],  [address]    Culture >=100,000 COLONIES/mL PROTEUS MIRABILIS (A)  Final   Report Status PENDING  Incomplete  SARS CORONAVIRUS 2 (TAT 6-24 HRS) Nasopharyngeal Nasopharyngeal Swab     Status: None   Collection Time: 05/20/20  7:28 AM   Specimen: Nasopharyngeal Swab  Result Value Ref Range Status   SARS Coronavirus 2 NEGATIVE NEGATIVE Final    Comment: (NOTE) SARS-CoV-2 target nucleic acids are NOT DETECTED.  The SARS-CoV-2 RNA is generally detectable in upper and lower respiratory specimens during the acute phase of infection. Negative results do not preclude SARS-CoV-2 infection, do not rule out co-infections with other pathogens, and should not be used as the sole basis for treatment or other patient management decisions. Negative results must be combined with clinical observations, patient history, and epidemiological information. The expected result is Negative.  Fact Sheet for Patients: SugarRoll.be  Fact Sheet for Healthcare Providers: https://www.woods-mathews.com/  This test is not yet approved or cleared by the [place] FDA and  has been authorized for  detection and/or diagnosis of SARS-CoV-2 by FDA under an Emergency Use Authorization (EUA). This EUA will remain  in effect (meaning this test can be used) for the duration of the COVID-19 declaration under Se ction 564(b)(1) of the Act, 21 U.S.C. section 360bbb-3(b)(1), unless the authorization is terminated or revoked sooner.  Performed at [facility], [place] [address] [place], [place] [address]      Radiology Studies: US Abdomen Limited RUQ (LIVER/GB)  Result Date: 05/19/2020 CLINICAL DATA:  Cholelithiasis EXAM: ULTRASOUND ABDOMEN LIMITED RIGHT UPPER QUADRANT COMPARISON:  CT 05/14/2020 FINDINGS: Gallbladder: 1.2 cm stone noted within the gallbladder neck. Sludge within the gallbladder. No wall thickening. No reported sonographic Murphy sign. Common bile duct: Diameter: Upper limits normal in diameter, 7 mm. Distal duct is not visualized due to overlying bowel gas. Liver: Coarsened, increased echotexture throughout the liver. Nodular contours and shrunken appearance. Findings compatible with cirrhosis. No flow seen within the portal vein concerning for portal venous thrombosis. Other: Moderate to large volume ascites. IMPRESSION: Changes of cirrhosis with nodular shrunken liver and associated ascites. No flow seen within the portal vein concerning for portal venous thrombosis. Cholelithiasis.  No sonographic evidence of acute cholecystitis. Electronically Signed   By: Rolm Baptise M.D.   On: 05/19/2020 20:18    Scheduled Meds: . enoxaparin (LOVENOX) injection  0.5 mg/kg Subcutaneous Q24H  . feeding supplement  237 mL Oral BID BM  . insulin aspart  0-15 Units Subcutaneous TID WC  . insulin aspart  0-5 Units Subcutaneous QHS  . pantoprazole  40 mg Oral BID   Continuous Infusions: . cefTRIAXone (ROCEPHIN)  IV 1 g (05/21/20 1349)  . sodium chloride (hypertonic) 35 mL/hr at 05/21/20 0923     LOS: 2 days   Time spent: 40 minutes.  Lorella Nimrod, MD [facility]  If  7PM-7AM, please contact night-coverage [email or web address]  05/21/2020, 3:33 PM   This record has been created using Systems analyst. Errors have been sought and corrected,but may not always be located. Such creation errors do not reflect on the standard of care.

## 2020-05-21 NOTE — ED Notes (Signed)
Provider notified of hypertonic saline solution being stopped at 1238 previous day. Order still in place for continuous hypertonic saline at 50 ml/hr. Hassan Rowan Morrision NP advised medication to be stopped. Medication discontinued in [facility] by Randol Kern NP.

## 2020-05-21 NOTE — ED Notes (Signed)
Pt had BM in breif changed and new purewick placed and new depends as well. Pt refuses insulin. CBG 123. Daughter at bedside.

## 2020-05-21 NOTE — Progress Notes (Signed)
[address] [place], [place] [address] Phone [phone]. Fax [phone]  Date: 05/21/2020                  Patient Name:  Jennifer Jennifer [name]  MRN: [id number]  DOB: [birth year]  Age / Sex: 80 y.o., female         PCP: Mar Daring, PA-C                 Service Requesting Consult: IM/ Lorella Nimrod, MD                 Reason for Consult: Hyponatremia            History of Presenting Illness: Patient is a 80 y.o. female  was admitted to [facility] on 05/19/2020  Patient presented to the emergency room with a 2-day history of right upper quadrant pain and nausea.  She has not been able to eat much and has been drinking clear liquids and water.  She states she was diagnosed with gallbladder problems last month.  She is undergoing evaluation for cholecystectomy. At home she reported that she had an episode of shaking.  Her son thought she was having seizures.  Patient did not have loss of consciousness.  No bowel or urinary incontinence.  She also felt that her left hand was cold.  She reports dysuria recently and thought she had a UTI. In the ER upon presentation her sodium was 107.  She was placed on 3% saline which resulted in improvement of sodium to 116  1/14: Hypertonic saline was discontinued overnight but had to be restarted because there was no significant improvement in sodium.  Currently levels are about 118 at 8:34 this morning   Vital Signs: Blood pressure (!) 111/59, pulse 86, temperature 97.6 F (36.4 C), temperature source Oral, resp. rate 16, height 5' 1"  (1.549 m), weight 81 kg, SpO2 100 %.  No intake or output data in the 24 hours ending 05/21/20 1541  Weight trends: Filed Weights   05/19/20 1749  Weight: 81 kg   Physical Exam: Jennifer [name]:  No acute distress, laying in the bed  HEENT  anicteric, moist oral mucous membrane  Pulm/lungs  normal breathing effort, lungs are clear to auscultation  CVS/Heart  regular rhythm, no rub or gallop  Abdomen:    Soft, nontender  Extremities: + peripheral edema  Neurologic:  Alert, oriented, able to follow commands  Skin:  No acute rashes      Lab results: Basic Metabolic Panel: Recent Labs  Lab 05/20/20 2216 05/21/20 0255 05/21/20 0834  NA 118* 117* 118*  K 3.8 3.9 3.9  CL 84* 85* 86*  CO2 25 26 25   GLUCOSE 97 99 101*  BUN 7* 7* 7*  CREATININE 0.71 0.76 0.70  CALCIUM 8.0* 8.4* 8.5*    Liver Function Tests: Recent Labs  Lab 05/19/20 1751  AST 38  ALT 17  ALKPHOS 73  BILITOT 2.0*  PROT 6.7  ALBUMIN 3.4*   Recent Labs  Lab 05/19/20 1751  LIPASE 41   No results for input(s): AMMONIA in the last 168 hours.  CBC: Recent Labs  Lab 05/19/20 1751 05/19/20 2336  WBC 5.2 6.4  HGB 12.1 10.8*  HCT 32.3* 28.9*  MCV 83.9 83.3  PLT 72* 57*    Cardiac Enzymes: No results for input(s): CKTOTAL, TROPONINI in the last 168 hours.  BNP: Invalid input(s): POCBNP  CBG: Recent Labs  Lab 05/20/20 0737 05/20/20 1203 05/20/20 1858 05/20/20 2202 05/21/20  [place]    Microbiology: Recent Results (from the past 720 hour(s))  Urine Culture     Status: Abnormal (Preliminary result)   Collection Time: 05/19/20 10:36 PM   Specimen: Urine, Clean Catch  Result Value Ref Range Status   Specimen Description   Final    URINE, CLEAN CATCH Performed at [facility], [address], [place], [place] [address]    Special Requests   Final    NONE Performed at [facility], [place], [place] [address]    Culture >=100,000 COLONIES/mL PROTEUS MIRABILIS (A)  Final   Report Status PENDING  Incomplete  SARS CORONAVIRUS 2 (TAT 6-24 HRS) Nasopharyngeal Nasopharyngeal Swab     Status: None   Collection Time: 05/20/20  7:28 AM   Specimen: Nasopharyngeal Swab  Result Value Ref Range Status   SARS Coronavirus 2 NEGATIVE NEGATIVE Final    Comment: (NOTE) SARS-CoV-2 target nucleic acids are NOT DETECTED.  The SARS-CoV-2 RNA is  generally detectable in upper and lower respiratory specimens during the acute phase of infection. Negative results do not preclude SARS-CoV-2 infection, do not rule out co-infections with other pathogens, and should not be used as the sole basis for treatment or other patient management decisions. Negative results must be combined with clinical observations, patient history, and epidemiological information. The expected result is Negative.  Fact Sheet for Patients: SugarRoll.be  Fact Sheet for Healthcare Providers: https://www.woods-mathews.com/  This test is not yet approved or cleared by the [place] FDA and  has been authorized for detection and/or diagnosis of SARS-CoV-2 by FDA under an Emergency Use Authorization (EUA). This EUA will remain  in effect (meaning this test can be used) for the duration of the COVID-19 declaration under Se ction 564(b)(1) of the Act, 21 U.S.C. section 360bbb-3(b)(1), unless the authorization is terminated or revoked sooner.  Performed at [facility], [place] [address], [place], [place] [address]      Coagulation Studies: No results for input(s): LABPROT, INR in the last 72 hours.  Urinalysis: Recent Labs    05/20/20 0400  COLORURINE AMBER*  LABSPEC 1.014  PHURINE 8.0  GLUCOSEU NEGATIVE  HGBUR SMALL*  BILIRUBINUR NEGATIVE  KETONESUR NEGATIVE  PROTEINUR 30*  NITRITE NEGATIVE  LEUKOCYTESUR LARGE*        Imaging: US Abdomen Limited RUQ (LIVER/GB)  Result Date: 05/19/2020 CLINICAL DATA:  Cholelithiasis EXAM: ULTRASOUND ABDOMEN LIMITED RIGHT UPPER QUADRANT COMPARISON:  CT 05/14/2020 FINDINGS: Gallbladder: 1.2 cm stone noted within the gallbladder neck. Sludge within the gallbladder. No wall thickening. No reported sonographic Murphy sign. Common bile duct: Diameter: Upper limits normal in diameter, 7 mm. Distal duct is not visualized due to overlying bowel gas. Liver: Coarsened,  increased echotexture throughout the liver. Nodular contours and shrunken appearance. Findings compatible with cirrhosis. No flow seen within the portal vein concerning for portal venous thrombosis. Other: Moderate to large volume ascites. IMPRESSION: Changes of cirrhosis with nodular shrunken liver and associated ascites. No flow seen within the portal vein concerning for portal venous thrombosis. Cholelithiasis.  No sonographic evidence of acute cholecystitis. Electronically Signed   By: Rolm Baptise M.D.   On: 05/19/2020 20:18   Scheduled Meds: . enoxaparin (LOVENOX) injection  0.5 mg/kg Subcutaneous Q24H  . feeding supplement  237 mL Oral BID BM  . insulin aspart  0-15 Units Subcutaneous TID WC  . insulin aspart  0-5 Units Subcutaneous QHS  . pantoprazole  40 mg Oral  BID   Continuous Infusions: . cefTRIAXone (ROCEPHIN)  IV 1 g (05/21/20 1349)  . sodium chloride (hypertonic) 35 mL/hr at 05/21/20 0923   PRN Meds:.acetaminophen **OR** acetaminophen, albuterol, ondansetron **OR** ondansetron (ZOFRAN) IV   Assessment & Plan: Pt is a 80 y.o.   female with diabetes, cirrhosis, chronic hyponatremia secondary to [name], Hypertension was admitted on 05/19/2020 with Hyponatremia [E87.1]   # Hyponatremia # LE edema Hyponatremia appears to be chronic with sodium ranging from 129-133.  Most recently sodium of 127 on April 23, 2020. Since then sodium level has been decreasing and was 107 at admission.  Patient has been taking furosemide at home for lower extremity edema.  She also reports that oral intake has been poor except for water. A.m. cortisol 19.6 May 20, 2020 TSH normal at 1.067 Urinalysis with specific gravity 1.014, greater than 50 WBCs, urine sodium 12, urine osmolarity 381  Chronic hyponatremia seems to be secondary to nonalcoholic cirrhosis with baseline of 129-133.  Worsening of sodium appears to be secondary to poor oral intake and concurrent use of Lasix.  Recommend: Encourage  oral intake including high-protein diet Hold Lasix  monitor Na closely Continue 3% saline Goal for correction: 126 for Saturday AM If no significant improvement, tolvaptan can be considered tomorrow  #Diabetes Lab Results  Component Value Date   HGBA1C 5.7 (H) 05/19/2020       LOS: 2 Lillyanne Bradburn Candiss Norse 1/14/20223:41 PM    Note: This note was prepared with Dragon dictation. Any transcription errors are unintentional

## 2020-05-21 NOTE — ED Notes (Signed)
Pt given TV remote per request. Son at bedside. NAD noted. Hypertonic solution infusing.

## 2020-05-21 NOTE — ED Notes (Signed)
Date and time results received: 05/21/20   Test: Na+ Critical Value: 118  Name of Provider Notified: Dr. Milana Kidney secure chat  Orders Received? Or Actions Taken?: restart 3% NA

## 2020-05-21 NOTE — ED Notes (Signed)
Pt resting in room with daughter at bedside.  Pt given meal tray but did not want the food d/t it being cold.

## 2020-05-21 NOTE — ED Notes (Signed)
Pt called out using call bell stating she needed her brief changed. Stool in brief. Purwick removed and discarded. Pt cleansed using wipes. Clean brief and chux placed. New purewick placed and connected to suction. Pt assisted in repositioning in bed. Pt covered with fresh warm blankets. Lights turned off per pt request. Denies further needs at this time.

## 2020-05-21 NOTE — Progress Notes (Signed)
Pharmacy Electrolyte Replacement - Hypertonic Saline  Recent Labs:  Recent Labs    05/21/20 0255  K 3.9  CREATININE 0.76    Low Critical Values Present:  Hypertonic Saline started on 01/13 @ 0009 at 50 ml/hr.  01/12 1947 Na = 108 01/12 2336 Na = 110 01/13 Contacted Lab about 0200 level.  Per lab tech, level was "mis-timed" and appeared to be duplicate and therefore was deleted. Next level scheduled to be drawn at ~0520.  Pharmacy continuing to follow. 01/13 0526 Na = 112 (116>117>118) 01/14 0255 Na = 117, stable   Plan: Continue 3% NaCl at current rate, f/u Na at next lab draw  Hart Robinsons, PharmD Clinical Pharmacist  05/21/2020 6:09 AM

## 2020-05-21 NOTE — ED Notes (Addendum)
Date and time results received: 05/21/20 0352  Test: Sodium Critical Value: 117  Name of Provider Notified: Sharion Settler NP  Advised to continue with current rate of infusion.

## 2020-05-22 DIAGNOSIS — E871 Hypo-osmolality and hyponatremia: Secondary | ICD-10-CM | POA: Diagnosis not present

## 2020-05-22 LAB — CBG MONITORING, ED: Glucose-Capillary: 132 mg/dL — ABNORMAL HIGH (ref 70–99)

## 2020-05-22 LAB — URINE CULTURE: Culture: >=100000 — AB

## 2020-05-22 LAB — HEPATIC FUNCTION PANEL
ALT: 15 U/L (ref 0–44)
AST: 38 U/L (ref 15–41)
Albumin: 2.7 g/dL — ABNORMAL LOW (ref 3.5–5.0)
Alkaline Phosphatase: 62 U/L (ref 38–126)
Bilirubin, Direct: 0.3 mg/dL — ABNORMAL HIGH (ref 0.0–0.2)
Indirect Bilirubin: 0.8 mg/dL (ref 0.3–0.9)
Total Bilirubin: 1.1 mg/dL (ref 0.3–1.2)
Total Protein: 5.4 g/dL — ABNORMAL LOW (ref 6.5–8.1)

## 2020-05-22 LAB — BASIC METABOLIC PANEL
Anion gap: 8 (ref 5–15)
Anion gap: 10 (ref 5–15)
BUN: 7 mg/dL — ABNORMAL LOW (ref 8–23)
BUN: 7 mg/dL — ABNORMAL LOW (ref 8–23)
CO2: 25 mmol/L (ref 22–32)
CO2: 26 mmol/L (ref 22–32)
Calcium: 8.4 mg/dL — ABNORMAL LOW (ref 8.9–10.3)
Calcium: 8.5 mg/dL — ABNORMAL LOW (ref 8.9–10.3)
Chloride: 90 mmol/L — ABNORMAL LOW (ref 98–111)
Chloride: 95 mmol/L — ABNORMAL LOW (ref 98–111)
Creatinine, Ser: 0.63 mg/dL (ref 0.44–1.00)
Creatinine, Ser: 0.73 mg/dL (ref 0.44–1.00)
GFR, Estimated: >60 mL/min (ref >60)
GFR, Estimated: >60 mL/min (ref >60)
Glucose, Bld: 111 mg/dL — ABNORMAL HIGH (ref 70–99)
Glucose, Bld: 171 mg/dL — ABNORMAL HIGH (ref 70–99)
Potassium: 3.8 mmol/L (ref 3.5–5.1)
Potassium: 3.6 mmol/L (ref 3.5–5.1)
Sodium: 123 mmol/L — ABNORMAL LOW (ref 135–145)
Sodium: 131 mmol/L — ABNORMAL LOW (ref 135–145)

## 2020-05-22 LAB — GLUCOSE, CAPILLARY
Glucose-Capillary: 160 mg/dL — ABNORMAL HIGH (ref 70–99)
Glucose-Capillary: 122 mg/dL — ABNORMAL HIGH (ref 70–99)

## 2020-05-22 LAB — SODIUM
Sodium: 122 mmol/L — ABNORMAL LOW (ref 135–145)
Sodium: 123 mmol/L — ABNORMAL LOW (ref 135–145)
Sodium: 125 mmol/L — ABNORMAL LOW (ref 135–145)
Sodium: 130 mmol/L — ABNORMAL LOW (ref 135–145)

## 2020-05-22 MED ORDER — TOLVAPTAN 15 MG PO TABS
15.0000 mg | ORAL_TABLET | Freq: Once | ORAL | Status: AC
  Administered 2020-05-22: 15 mg via ORAL
  Filled 2020-05-22: qty 1

## 2020-05-22 MED ORDER — CEPHALEXIN 500 MG PO CAPS
500.0000 mg | ORAL_CAPSULE | Freq: Two times a day (BID) | ORAL | Status: AC
  Administered 2020-05-22 – 2020-05-23 (×3): 500 mg via ORAL
  Filled 2020-05-22 (×3): qty 1

## 2020-05-22 NOTE — ED Notes (Signed)
Patient son [name] at bedside

## 2020-05-22 NOTE — TOC Initial Note (Signed)
Transition of Care [facility]) - Initial/Assessment Note    Patient Details  Name: Jennifer [name] MRN: [id number] Date of Birth: Jun 22, 1940  Transition of Care [facility]) CM/SW Contact:    Elliot Gurney [place],  Phone Number: 05/22/2020, 1:07 PM  Clinical Narrative:                 TOC consulted by PT. Patient recommended for HHPT upon discharge. Patient contacted by phone. Patient's son at bedside. Patient resides alone in her own home, however her son lives in the home behind hers and is supportive and  available. Per patient, she has a wheelchair and a walker at home. Patient states that she has had [facility] in the past but could not remember the name. Patient has no preference at this time. [facility] contacted and they have agreed to accept patient for PT and nursing. Patient verbalized having no additional discharge planning needs at this time.  TOC to continue to follow for discharge needs.   Bayden Gil, LCSW Transition of Care [phone]   Expected Discharge Plan: [place] Barriers to Discharge: No Barriers Identified   Patient Goals and CMS Choice Patient states their goals for this hospitalization and ongoing recovery are:: "I want to get stronger"      Expected Discharge Plan and Services Expected Discharge Plan: [place] In-house Referral: Clinical Social Work     Living arrangements for the past 2 months: Single Family Home                           HH Arranged: PT          Prior Living Arrangements/Services Living arrangements for the past 2 months: Single Family Home Lives with:: Self Patient language and need for interpreter reviewed:: Yes Do you feel safe going back to the place where you live?: Yes      Need for Family Participation in Patient Care: Yes (Comment) Care giver support system in place?: Yes (comment)   Criminal Activity/Legal Involvement Pertinent to Current Situation/Hospitalization: Yes - Comment  as needed  Activities of Daily Living      Permission Sought/Granted   Permission granted to share information with : Yes, Verbal Permission Granted  Share Information with NAME: tommy     Permission granted to share info w Relationship: son  Permission granted to share info w Contact Information: [phone]  Emotional Assessment   Attitude/Demeanor/Rapport: Engaged,Self-Confident Affect (typically observed): Accepting,Adaptable Orientation: : Oriented to Self,Oriented to Place,Oriented to  Time,Oriented to Situation Alcohol / Substance Use: Not Applicable Psych Involvement: No (comment)  Admission diagnosis:  Hyponatremia [E87.1] Patient Active Problem List   Diagnosis Date Noted  . Hyponatremia 05/19/2020  . Chronic hyponatremia 05/19/2020  . Essential hypertension, benign 02/04/2018  . Iron deficiency 02/04/2018  . Pelvic fracture ([place]) 01/31/2018  . Multiple gastric polyps   . Hx of colonic polyps   . Acute upper GI bleed 07/12/2017  . Liver cirrhosis secondary to NASH ([place])   . Carpal tunnel syndrome on right 09/28/2016  . Obesity (BMI 30.0-34.9) 09/20/2016  . Upper respiratory infection 03/17/2016  . Thrombocytopenia ([place]) 12/29/2015  . Benign neoplasm of ascending colon   . Benign neoplasm of transverse colon   . Benign neoplasm of sigmoid colon   . Allergic rhinitis 10/14/2014  . Airway hyperreactivity 10/14/2014  . Type 2 diabetes mellitus with unspecified complications ([place]) 33/29/5188  . Calculus of gallbladder 10/14/2014  . Esophagitis,  reflux 10/14/2014  . Vitamin D deficiency 10/14/2014   PCP:  Mar Daring, PA-C Pharmacy:   [place], [place] [address] Phone: [phone] Fax: [phone]  [facility] [place], [place] [address] Phone: [phone] Fax: [phone]     Social Determinants of Health  (SDOH) Interventions    Readmission Risk Interventions No flowsheet data found.

## 2020-05-22 NOTE — ED Notes (Signed)
Pt taken off bed pan and new PureWick applied.

## 2020-05-22 NOTE — Evaluation (Signed)
Physical Therapy Evaluation Patient Details Name: Jennifer [name] MRN: [id number] DOB: Aug 31, 1940 Today's Date: 05/22/2020   History of Present Illness  Pt is an 80 y.o. female with medical history significant for DM and cirrhosis with chronic hyponatremia secondary to NASH who presented to the emergency room with a 2-day history of right upper quadrant pain off and on for the past 3 weeks associated with nausea.  MD assessment includes: Acute on chronic hyponatremia, RUQ pain, and UTI.    Clinical Impression  Pt was pleasant and motivated to participate during the session.  Overall pt performed well during the session and put forth good effort throughout.  Pt required only min A for BLE control during sit to sup and was steady during transfers and gait.  Pt's SpO2 noted to drop into the upper 80s at one point but with poor signal quality.  Pt's SpO2 increased quickly back to the upper 90s with sensor adjustment.  Pt reported no adverse symptoms other than very mild dizziness when walking that she reported as baseline.  Pt will benefit from HHPT services upon discharge to safely address deficits listed in patient problem list for decreased caregiver assistance and eventual return to PLOF.      Follow Up Recommendations Home health PT;Supervision for mobility/OOB    Equipment Recommendations  None recommended by PT    Recommendations for Other Services       Precautions / Restrictions Precautions Precautions: Fall;Other (comment) Precaution Comments: Seizure precautions Restrictions Weight Bearing Restrictions: No      Mobility  Bed Mobility Overal bed mobility: Needs Assistance Bed Mobility: Supine to Sit;Sit to Supine     Supine to sit: Supervision Sit to supine: Min assist   General bed mobility comments: Min A during sit to sup to manage BLEs    Transfers Overall transfer level: Needs assistance Equipment used: Rolling walker (2 wheeled) Transfers: Sit to/from  Stand Sit to Stand: From elevated surface;Min guard         General transfer comment: Good eccentric and concentric control  Ambulation/Gait Ambulation/Gait assistance: Min guard Gait Distance (Feet): 30 Feet Assistive device: Rolling walker (2 wheeled) Gait Pattern/deviations: Decreased step length - right;Step-through pattern;Decreased step length - left Gait velocity: decreased   General Gait Details: Pt steady with amb without LOB or buckling  Stairs            Wheelchair Mobility    Modified Rankin (Stroke Patients Only)       Balance Overall balance assessment: Needs assistance   Sitting balance-Leahy Scale: Good     Standing balance support: During functional activity;Bilateral upper extremity supported Standing balance-Leahy Scale: Good Standing balance comment: Min lean on the RW for support                             Pertinent Vitals/Pain Pain Assessment: No/denies pain    Home Living Family/patient expects to be discharged to:: Private residence Living Arrangements: Children Available Help at Discharge: Family;Available 24 hours/day Type of Home: House Home Access: Stairs to enter Entrance Stairs-Rails: Left Entrance Stairs-Number of Steps: 3 Home Layout: One level Home Equipment: Walker - 2 wheels      Prior Function Level of Independence: Independent         Comments: Ind amb community distances without an AD, no fall history, Ind with ADLs     Hand Dominance        Extremity/Trunk Assessment   Upper Extremity  Assessment Upper Extremity Assessment: Generalized weakness    Lower Extremity Assessment Lower Extremity Assessment: Generalized weakness       Communication   Communication: No difficulties  Cognition Arousal/Alertness: Awake/alert Behavior During Therapy: WFL for tasks assessed/performed Overall Cognitive Status: Within Functional Limits for tasks assessed                                         General Comments      Exercises Total Joint Exercises Ankle Circles/Pumps: AROM;Both;10 reps Heel Slides: AROM;Strengthening;Both;5 reps Hip ABduction/ADduction: AROM;Strengthening;Both;5 reps Long Arc Quad: AROM;Strengthening;Both;10 reps;15 reps Knee Flexion: AROM;Strengthening;Both;10 reps;15 reps Marching in Standing: AROM;Strengthening;Both;5 reps Bridges: Strengthening;Both;5 reps   Assessment/Plan    PT Assessment Patient needs continued PT services  PT Problem List Decreased strength;Decreased activity tolerance;Decreased balance;Decreased mobility;Decreased knowledge of use of DME       PT Treatment Interventions DME instruction;Stair training;Gait training;Functional mobility training;Therapeutic activities;Therapeutic exercise;Balance training;Patient/family education    PT Goals (Current goals can be found in the Care Plan section)  Acute Rehab PT Goals Patient Stated Goal: To get healthy PT Goal Formulation: With patient Time For Goal Achievement: 06/04/20 Potential to Achieve Goals: Good    Frequency Min 2X/week   Barriers to discharge        Co-evaluation               AM-PAC PT "6 Clicks" Mobility  Outcome Measure Help needed turning from your back to your side while in a flat bed without using bedrails?: A Little Help needed moving from lying on your back to sitting on the side of a flat bed without using bedrails?: A Little Help needed moving to and from a bed to a chair (including a wheelchair)?: A Little Help needed standing up from a chair using your arms (e.g., wheelchair or bedside chair)?: A Little Help needed to walk in hospital room?: A Little Help needed climbing 3-5 steps with a railing? : A Little 6 Click Score: 18    End of Session Equipment Utilized During Treatment: Gait belt Activity Tolerance: Patient tolerated treatment well Patient left: in bed;with call bell/phone within reach;with family/visitor  present Nurse Communication: Mobility status PT Visit Diagnosis: Difficulty in walking, not elsewhere classified (R26.2);Muscle weakness (generalized) (M62.81)    Time: 9021-1155 PT Time Calculation (min) (ACUTE ONLY): 46 min   Charges:   PT Evaluation $PT Eval Moderate Complexity: 1 Mod PT Treatments $Therapeutic Exercise: 8-22 mins        D. [name] Brit Carbonell PT, DPT 05/22/20, 1:29 PM

## 2020-05-22 NOTE — Progress Notes (Signed)
[facility]  ROUNDING NOTE   Subjective:   Patient is 80 years old female was admitted to [facility] on 05/19/2020. Patient presented to the ED with 2-day history of right upper quadrant pain and nausea.  She was not able to eat much and has been drinking clear liquids and water. Nephrology was consulted for the management of hyponatremia and on presentation to ER her sodium level was 107.  She got treated with 3% saline with improvement of sodium to 116. Hypertonic saline was discontinued on 05/21/2020, but restarted later. Today 3% Saline was off at the time of our evaluation in the ED. Sodium level noticed to be improved to 123 this morning.  Objective:  Vital signs in last 24 hours:  Temp:  [98.6 F (37 C)] 98.6 F (37 C) (01/15 1303) Pulse Rate:  [76-93] 93 (01/15 1500) Resp:  [14-25] 18 (01/15 1500) BP: (98-122)/(54-70) 114/59 (01/15 1500) SpO2:  [93 %-100 %] 98 % (01/15 1500)  Weight change:  Filed Weights   05/19/20 1749  Weight: 81 kg    Intake/Output: I/O last 3 completed shifts: In: 326.7 [I.V.:326.7] Out: -    Intake/Output this shift:  No intake/output data recorded.  Physical Exam: General:  Resting in bed, in no acute distress  Head: Normocephalic, atraumatic. Moist oral mucosal membranes  Eyes: Anicteric  Lungs:   Respirations symmetrical, unlabored, lungs clear to auscultation  Heart: Regular rate and rhythm  Abdomen:  Soft, nontender, nondistended  Extremities:  Trace peripheral edema.  Neurologic:  Awake, alert, oriented  Skin: No acute lesions or rashes    Basic Metabolic Panel: Recent Labs  Lab 05/20/20 1500 05/20/20 2216 05/21/20 0255 05/21/20 0834 05/21/20 1620 05/22/20 0029 05/22/20 0309 05/22/20 1130  NA 117* 118* 117* 118* 119* 122* 123*  123* 125*  K 4.0 3.8 3.9 3.9  --   --  3.8  --   CL 81* 84* 85* 86*  --   --  90*  --   CO2 25 25 26 25   --   --  25  --   GLUCOSE 147* 97 99 101*  --   --  111*  --   BUN 6* 7* 7* 7*   --   --  7*  --   CREATININE 0.72 0.71 0.76 0.70  --   --  0.63  --   CALCIUM 8.1* 8.0* 8.4* 8.5*  --   --  8.4*  --     Liver Function Tests: Recent Labs  Lab 05/19/20 1751  AST 38  ALT 17  ALKPHOS 73  BILITOT 2.0*  PROT 6.7  ALBUMIN 3.4*   Recent Labs  Lab 05/19/20 1751  LIPASE 41   No results for input(s): AMMONIA in the last 168 hours.  CBC: Recent Labs  Lab 05/19/20 1751 05/19/20 2336  WBC 5.2 6.4  HGB 12.1 10.8*  HCT 32.3* 28.9*  MCV 83.9 83.3  PLT 72* 57*    Cardiac Enzymes: No results for input(s): CKTOTAL, CKMB, CKMBINDEX, TROPONINI in the last 168 hours.  BNP: Invalid input(s): POCBNP  CBG: Recent Labs  Lab 05/20/20 2202 05/21/20 0851 05/21/20 1846 05/21/20 2135 05/22/20 1421  GLUCAP 94 99 128* 138* 132*    Microbiology: Results for orders placed or performed during the hospital encounter of 05/19/20  Urine Culture     Status: Abnormal   Collection Time: 05/19/20 10:36 PM   Specimen: Urine, Clean Catch  Result Value Ref Range Status   Specimen Description  Final    URINE, CLEAN CATCH Performed at [facility], [address] [place], [place] [address]    Special Requests   Final    NONE Performed at [facility], [address] [place], [place] [address]    Culture >=100,000 COLONIES/mL PROTEUS MIRABILIS (A)  Final   Report Status 05/22/2020 FINAL  Final   Organism ID, Bacteria PROTEUS MIRABILIS (A)  Final      Susceptibility   Proteus mirabilis - MIC*    AMPICILLIN <=2 SENSITIVE Sensitive     CEFAZOLIN <=4 SENSITIVE Sensitive     CEFEPIME <=0.12 SENSITIVE Sensitive     CEFTRIAXONE <=0.25 SENSITIVE Sensitive     CIPROFLOXACIN <=0.25 SENSITIVE Sensitive     GENTAMICIN <=1 SENSITIVE Sensitive     IMIPENEM 2 SENSITIVE Sensitive     NITROFURANTOIN 128 RESISTANT Resistant     TRIMETH/SULFA <=20 SENSITIVE Sensitive     AMPICILLIN/SULBACTAM <=2 SENSITIVE Sensitive     PIP/TAZO <=4 SENSITIVE Sensitive     *  >=100,000 COLONIES/mL PROTEUS MIRABILIS  SARS CORONAVIRUS 2 (TAT 6-24 HRS) Nasopharyngeal Nasopharyngeal Swab     Status: None   Collection Time: 05/20/20  7:28 AM   Specimen: Nasopharyngeal Swab  Result Value Ref Range Status   SARS Coronavirus 2 NEGATIVE NEGATIVE Final    Comment: (NOTE) SARS-CoV-2 target nucleic acids are NOT DETECTED.  The SARS-CoV-2 RNA is generally detectable in upper and lower respiratory specimens during the acute phase of infection. Negative results do not preclude SARS-CoV-2 infection, do not rule out co-infections with other pathogens, and should not be used as the sole basis for treatment or other patient management decisions. Negative results must be combined with clinical observations, patient history, and epidemiological information. The expected result is Negative.  Fact Sheet for Patients: SugarRoll.be  Fact Sheet for Healthcare Providers: https://www.woods-mathews.com/  This test is not yet approved or cleared by the [place] FDA and  has been authorized for detection and/or diagnosis of SARS-CoV-2 by FDA under an Emergency Use Authorization (EUA). This EUA will remain  in effect (meaning this test can be used) for the duration of the COVID-19 declaration under Se ction 564(b)(1) of the Act, 21 U.S.C. section 360bbb-3(b)(1), unless the authorization is terminated or revoked sooner.  Performed at [facility], [place] [address], [place], [place] [address]     Coagulation Studies: No results for input(s): LABPROT, INR in the last 72 hours.  Urinalysis: Recent Labs    05/20/20 0400  COLORURINE AMBER*  LABSPEC 1.014  PHURINE 8.0  GLUCOSEU NEGATIVE  HGBUR SMALL*  BILIRUBINUR NEGATIVE  KETONESUR NEGATIVE  PROTEINUR 30*  NITRITE NEGATIVE  LEUKOCYTESUR LARGE*      Imaging: No results found.   Medications:   . sodium chloride (hypertonic) 35 mL/hr at 05/22/20 1301   . cephALEXin   500 mg Oral Q12H  . enoxaparin (LOVENOX) injection  0.5 mg/kg Subcutaneous Q24H  . feeding supplement  237 mL Oral BID BM  . insulin aspart  0-15 Units Subcutaneous TID WC  . insulin aspart  0-5 Units Subcutaneous QHS  . pantoprazole  40 mg Oral BID   acetaminophen **OR** acetaminophen, albuterol, ondansetron **OR** ondansetron (ZOFRAN) IV  Assessment/ Plan:  Jennifer [name] is a 80 y.o.  female with diabetes, cirrhosis, chronic hyponatremia secondary to [name], Hypertension was admitted on 05/19/2020 with Hyponatremia [E87.1]   #Hyponatremia Patient has history of chronic hyponatremia possibly due to nonalcoholic cirrhosis. Her baseline sodium level ranging from  129-133. On presentation to the emergency department patient's sodium level was 107.  She reports poor oral intake due to her abdominal pain.  She  was on furosemide at home.  Furosemide was held on 05/21/2020. Sodium level improved to 123 this morning We will plan to give her one dose of tolvaptan today We will continue monitoring closely  #Lower extremity edema Patient was on furosemide at home, currently on hold Peripheral edema trace today, will continue monitoring  #Diabetes type 2 Patient is on insulin aspart Management per Primary team  #Right upper quadrant pain Denies active pain at the time of our assessment Patient getting evaluated for cholecystectomy secondary to cholelithiasis Surgical team on board   LOS: 3 Cordai Rodrigue 1/15/20223:56 PM

## 2020-05-22 NOTE — Progress Notes (Addendum)
Pharmacy Electrolyte Replacement - Hypertonic Saline  Recent Labs:  Recent Labs    05/22/20 1935  K 3.6  CREATININE 0.73    Low Critical Values Present:  Hypertonic Saline started on 01/13 @ 0009 at 50 ml/hr.  01/12 1947 Na = 108 01/12 2336 Na = 110 01/13 Contacted Lab about 0200 level.  Per lab tech, level was "mis-timed" and appeared to be duplicate and therefore was deleted. Next level scheduled to be drawn at ~0520.  Pharmacy continuing to follow. 01/13 0526 Na = 112 (116>117>118) 01/14 0255 Na = 117, stable  01/15 0029 Na = 122 (118 > 119 > 122), slowly trending upward 1/15  1130 Na 125 1/15  1935 Na 131  Plan:  Hypertonic saline was d/c'd on 1/15 @ 1804.  Tolvaptan 15 mg PO X 1 ordered for 1/15 @ 1900 but not given as of 20:30.  Will check Na level Q8H X 2 beginning 1/16 @ 0200.   [place]esh[place] D Clinical Pharmacist 05/22/2020

## 2020-05-22 NOTE — Progress Notes (Signed)
PROGRESS NOTE    Jennifer [name]  OMA:[id number] DOB: 01-25-1941 DOA: 05/19/2020 PCP: Mar Daring, PA-C   Brief Narrative: Taken from H&P.  Jennifer [name] is a 80 y.o. female with medical history significant for   DM and cirrhosis with chronic hyponatremia secondary to NASH who presents to the emergency room with a 2-day history of right upper quadrant pain off and on for the past 3 weeks associated with nausea .  Pain is about 6 out of 10, colicky, nonradiating, made worse with oral intake.  She denies cough fever or chills and denies chest pain or shortness of breath. On arrival labs significant for sodium of 107, patient has chronic hyponatremia with baseline around 127.  It was 121 2 weeks ago.  Right upper quadrant ultrasound consistent with cirrhosis, no evidence of acute cholecystitis. She was started on hypertonic saline and nephrology was consulted. Hypertonic saline discontinued today when sodium was 116.  UA with pyuria, patient is having increased urinary frequency and dysuria, starting ceftriaxone today after sending culture.  Subjective: Patient seems improving.  Had questions regarding cholecystectomy, tried explaining the answers given to me by surgeons that she is not a good candidate and to high risk to be done in a community hospital.  They are recommending tertiary care center.  Son at bedside.  Seems agitated why we cannot help you in this hospital.  Tried explaining to best of my knowledge. Patient seems understanding but son remained quite upset.  Assessment & Plan:   Principal Problem:   Hyponatremia Active Problems:   Type 2 diabetes mellitus with unspecified complications (HCC)   Liver cirrhosis secondary to NASH [facility])   Essential hypertension, benign   Chronic hyponatremia  Acute on chronic hyponatremia.  Labs consistent with hypoosmolar hyponatremia.  Patient was drinking quite a bit of free water to keep herself hydrated.  She has chronic hyponatremia  secondary to cirrhosis, using Lasix, significant lower extremity edema.  Mentation at baseline this morning. Patient was placed on hypertonic saline and sodium improved to 116, hypertonic saline was stopped and sodium continue to improve to 118 and then started dropping again, hypertonic saline was again restarted at a lower rate of 35, sodium improved to 123 today.  Hypertonic saline stopped. -Nephrology is going to give her a dose of tolvaptan. -Restrict free water-she can drink Gatorade and Ensure. -Holding Lasix. -Continue to monitor. -PT recommending home health services which were ordered.  Right upper quadrant pain.  No pain this morning but she does have intermittent cramping going on in that area. RUQ ultrasound with 1.2 cm stone within the gallbladder neck with no sonographic Murphy sign, CBD dilated at upper limit at 7 mm.  No evidence of cholecystitis. Patient would like to be evaluated for cholecystectomy secondary to cholelithiasis. -Surgery was consulted and apparently she is not a good candidate for surgery due to her underlying liver condition. -Had another discussion today about going to a tertiary care center for further evaluation and recommendations.  UTI.  Urine with pyuria and patient to have urinary symptoms which includes urinary frequency, urgency and dysuria.  No fever or chills. Urine culture with Proteus mirabilis, with good sensitivity, only resistant to nitrofurantoin.  She was on ceftriaxone. -Switch ceftriaxone with Keflex to complete a 5-day course.  Type 2 diabetes mellitus with unspecified complications (HCC) - Sliding scale insulin coverage    Liver cirrhosis secondary to NASH ([place]).  Appears compensated. -Continue home spironolactone. -Holding Lasix.  Essential hypertension.  Blood  pressure within goal. -Continue spironolactone and monitor.  Objective: Vitals:   05/22/20 0659 05/22/20 0800 05/22/20 0900 05/22/20 1303  BP: (!) 106/57 (!) 101/57  105/63 (!) 110/57  Pulse: 80 76 86 86  Resp: 14 15 20 20   Temp:    98.6 F (37 C)  TempSrc:    Oral  SpO2: 95% 95% 96% 98%  Weight:      Height:        Intake/Output Summary (Last 24 hours) at 05/22/2020 1354 Last data filed at 05/21/2020 2111 Gross per 24 hour  Intake 326.69 ml  Output --  Net 326.69 ml   Filed Weights   05/19/20 1749  Weight: 81 kg    Examination:  General.  Pleasant elderly lady, in no acute distress. Pulmonary.  Lungs clear bilaterally, normal respiratory effort. CV.  Regular rate and rhythm, no JVD, rub or murmur. Abdomen.  Soft, nontender, nondistended, BS positive. CNS.  Alert and oriented x3.  No focal neurologic deficit. Extremities.  1+ LE edema, no cyanosis, pulses intact and symmetrical. Psychiatry.  Judgment and insight appears normal.   DVT prophylaxis: Lovenox Code Status: Full Family Communication: Son was updated at bedside. Disposition Plan:  Status is: Inpatient  Remains inpatient appropriate because:Inpatient level of care appropriate due to severity of illness   Dispo: The patient is from: Home              Anticipated d/c is to: Home              Anticipated d/c date is: 1 day              Patient currently is not medically stable to d/c.  Consultants:   Nephrology  General surgery  Procedures:  Antimicrobials: Rocephin  Data Reviewed: I have personally reviewed following labs and imaging studies  CBC: Recent Labs  Lab 05/19/20 1751 05/19/20 2336  WBC 5.2 6.4  HGB 12.1 10.8*  HCT 32.3* 28.9*  MCV 83.9 83.3  PLT 72* 57*   Basic Metabolic Panel: Recent Labs  Lab 05/20/20 1500 05/20/20 2216 05/21/20 0255 05/21/20 0834 05/21/20 1620 05/22/20 0029 05/22/20 0309 05/22/20 1130  NA 117* 118* 117* 118* 119* 122* 123*  123* 125*  K 4.0 3.8 3.9 3.9  --   --  3.8  --   CL 81* 84* 85* 86*  --   --  90*  --   CO2 25 25 26 25   --   --  25  --   GLUCOSE 147* 97 99 101*  --   --  111*  --   BUN 6* 7* 7* 7*  --    --  7*  --   CREATININE 0.72 0.71 0.76 0.70  --   --  0.63  --   CALCIUM 8.1* 8.0* 8.4* 8.5*  --   --  8.4*  --    GFR: Estimated Creatinine Clearance: 54.1 mL/min (by C-G formula based on SCr of 0.63 mg/dL). Liver Function Tests: Recent Labs  Lab 05/19/20 1751  AST 38  ALT 17  ALKPHOS 73  BILITOT 2.0*  PROT 6.7  ALBUMIN 3.4*   Recent Labs  Lab 05/19/20 1751  LIPASE 41   No results for input(s): AMMONIA in the last 168 hours. Coagulation Profile: No results for input(s): INR, PROTIME in the last 168 hours. Cardiac Enzymes: No results for input(s): CKTOTAL, CKMB, CKMBINDEX, TROPONINI in the last 168 hours. BNP (last 3 results) No results for input(s): PROBNP in the  last 8760 hours. HbA1C: Recent Labs    05/19/20 2336  HGBA1C 5.7*   CBG: Recent Labs  Lab 05/20/20 1858 05/20/20 2202 05/21/20 0851 05/21/20 1846 05/21/20 2135  GLUCAP 163* 94 99 128* 138*   Lipid Profile: No results for input(s): CHOL, HDL, LDLCALC, TRIG, CHOLHDL, LDLDIRECT in the last 72 hours. Thyroid Function Tests: Recent Labs    05/20/20 0526  TSH 1.067   Anemia Panel: No results for input(s): VITAMINB12, FOLATE, FERRITIN, TIBC, IRON, RETICCTPCT in the last 72 hours. Sepsis Labs: No results for input(s): PROCALCITON, LATICACIDVEN in the last 168 hours.  Recent Results (from the past 240 hour(s))  Urine Culture     Status: Abnormal   Collection Time: 05/19/20 10:36 PM   Specimen: Urine, Clean Catch  Result Value Ref Range Status   Specimen Description   Final    URINE, CLEAN CATCH Performed at [facility], [address] [place], [place] [address]    Special Requests   Final    NONE Performed at [facility], [place], [place] [address]    Culture >=100,000 COLONIES/mL PROTEUS MIRABILIS (A)  Final   Report Status 05/22/2020 FINAL  Final   Organism ID, Bacteria PROTEUS MIRABILIS (A)  Final      Susceptibility   Proteus mirabilis - MIC*     AMPICILLIN <=2 SENSITIVE Sensitive     CEFAZOLIN <=4 SENSITIVE Sensitive     CEFEPIME <=0.12 SENSITIVE Sensitive     CEFTRIAXONE <=0.25 SENSITIVE Sensitive     CIPROFLOXACIN <=0.25 SENSITIVE Sensitive     GENTAMICIN <=1 SENSITIVE Sensitive     IMIPENEM 2 SENSITIVE Sensitive     NITROFURANTOIN 128 RESISTANT Resistant     TRIMETH/SULFA <=20 SENSITIVE Sensitive     AMPICILLIN/SULBACTAM <=2 SENSITIVE Sensitive     PIP/TAZO <=4 SENSITIVE Sensitive     * >=100,000 COLONIES/mL PROTEUS MIRABILIS  SARS CORONAVIRUS 2 (TAT 6-24 HRS) Nasopharyngeal Nasopharyngeal Swab     Status: None   Collection Time: 05/20/20  7:28 AM   Specimen: Nasopharyngeal Swab  Result Value Ref Range Status   SARS Coronavirus 2 NEGATIVE NEGATIVE Final    Comment: (NOTE) SARS-CoV-2 target nucleic acids are NOT DETECTED.  The SARS-CoV-2 RNA is generally detectable in upper and lower respiratory specimens during the acute phase of infection. Negative results do not preclude SARS-CoV-2 infection, do not rule out co-infections with other pathogens, and should not be used as the sole basis for treatment or other patient management decisions. Negative results must be combined with clinical observations, patient history, and epidemiological information. The expected result is Negative.  Fact Sheet for Patients: SugarRoll.be  Fact Sheet for Healthcare Providers: https://www.woods-mathews.com/  This test is not yet approved or cleared by the [place] FDA and  has been authorized for detection and/or diagnosis of SARS-CoV-2 by FDA under an Emergency Use Authorization (EUA). This EUA will remain  in effect (meaning this test can be used) for the duration of the COVID-19 declaration under Se ction 564(b)(1) of the Act, 21 U.S.C. section 360bbb-3(b)(1), unless the authorization is terminated or revoked sooner.  Performed at [facility], [place] [address], [place],  [place] [address]      Radiology Studies: No results found.  Scheduled Meds: . enoxaparin (LOVENOX) injection  0.5 mg/kg Subcutaneous Q24H  . feeding supplement  237 mL Oral BID BM  . insulin aspart  0-15 Units Subcutaneous TID WC  . insulin aspart  0-5 Units Subcutaneous QHS  .  pantoprazole  40 mg Oral BID   Continuous Infusions: . cefTRIAXone (ROCEPHIN)  IV Stopped (05/21/20 1701)  . sodium chloride (hypertonic) 35 mL/hr at 05/22/20 1301     LOS: 3 days   Time spent: 40 minutes.  Lorella Nimrod, MD [facility]  If 7PM-7AM, please contact night-coverage [email or web address]  05/22/2020, 1:54 PM   This record has been created using Systems analyst. Errors have been sought and corrected,but may not always be located. Such creation errors do not reflect on the standard of care.

## 2020-05-22 NOTE — Progress Notes (Signed)
Pharmacy Electrolyte Replacement - Hypertonic Saline  Recent Labs:  Recent Labs    05/21/20 0834  K 3.9  CREATININE 0.70    Low Critical Values Present:  Hypertonic Saline started on 01/13 @ 0009 at 50 ml/hr.  01/12 1947 Na = 108 01/12 2336 Na = 110 01/13 Contacted Lab about 0200 level.  Per lab tech, level was "mis-timed" and appeared to be duplicate and therefore was deleted. Next level scheduled to be drawn at ~0520.  Pharmacy continuing to follow. 01/13 0526 Na = 112 (116>117>118) 01/14 0255 Na = 117, stable  01/15 0029 Na = 122 (118 > 119 > 122), slowly trending upward  Plan: Continue 3% NaCl at current rate, f/u Na at next lab draw  Hart Robinsons, PharmD Clinical Pharmacist  05/22/2020 3:14 AM

## 2020-05-22 NOTE — Progress Notes (Signed)
Pharmacy Electrolyte Replacement - Hypertonic Saline  Recent Labs:  Recent Labs    05/22/20 0309  K 3.8  CREATININE 0.63    Low Critical Values Present:  Hypertonic Saline started on 01/13 @ 0009 at 50 ml/hr.  01/12 1947 Na = 108 01/12 2336 Na = 110 01/13 Contacted Lab about 0200 level.  Per lab tech, level was "mis-timed" and appeared to be duplicate and therefore was deleted. Next level scheduled to be drawn at ~0520.  Pharmacy continuing to follow. 01/13 0526 Na = 112 (116>117>118) 01/14 0255 Na = 117, stable  01/15 0029 Na = 122 (118 > 119 > 122), slowly trending upward 1/15  1130 Na 125  Plan: Continue 3% NaCl at current rate, f/u Na at next lab draw  [place] Pharmacist 05/22/2020

## 2020-05-22 NOTE — ED Notes (Signed)
Patient IV infiltrated on right hand

## 2020-05-23 DIAGNOSIS — E871 Hypo-osmolality and hyponatremia: Secondary | ICD-10-CM | POA: Diagnosis not present

## 2020-05-23 LAB — BASIC METABOLIC PANEL
Anion gap: 7 (ref 5–15)
BUN: <5 mg/dL — ABNORMAL LOW (ref 8–23)
CO2: 28 mmol/L (ref 22–32)
Calcium: 9 mg/dL (ref 8.9–10.3)
Chloride: 96 mmol/L — ABNORMAL LOW (ref 98–111)
Creatinine, Ser: 0.72 mg/dL (ref 0.44–1.00)
GFR, Estimated: >60 mL/min (ref >60)
Glucose, Bld: 144 mg/dL — ABNORMAL HIGH (ref 70–99)
Potassium: 3.9 mmol/L (ref 3.5–5.1)
Sodium: 131 mmol/L — ABNORMAL LOW (ref 135–145)

## 2020-05-23 LAB — SODIUM: Sodium: 130 mmol/L — ABNORMAL LOW (ref 135–145)

## 2020-05-23 LAB — GLUCOSE, CAPILLARY
Glucose-Capillary: 112 mg/dL — ABNORMAL HIGH (ref 70–99)
Glucose-Capillary: 142 mg/dL — ABNORMAL HIGH (ref 70–99)
Glucose-Capillary: 128 mg/dL — ABNORMAL HIGH (ref 70–99)
Glucose-Capillary: 115 mg/dL — ABNORMAL HIGH (ref 70–99)

## 2020-05-23 MED ORDER — GLUCERNA SHAKE PO LIQD
237.0000 mL | Freq: Two times a day (BID) | ORAL | Status: DC
  Administered 2020-05-24: 237 mL via ORAL

## 2020-05-23 MED ORDER — DOCUSATE SODIUM 100 MG PO CAPS
100.0000 mg | ORAL_CAPSULE | Freq: Every day | ORAL | Status: DC | PRN
  Administered 2020-05-23: 100 mg via ORAL
  Filled 2020-05-23: qty 1

## 2020-05-23 NOTE — Progress Notes (Signed)
Patient complained of stomach pain.  Reports not having a BM in 2 days.  Wanted to get a stool softener.    MD gave verbal order for miralax or colace.   Patient wanted to try the colace.  Placed order

## 2020-05-23 NOTE — Progress Notes (Signed)
Pharmacy Electrolyte Replacement -tolvaptan Recent Labs:  Recent Labs    05/23/20 0952  K 3.9  CREATININE 0.72   Low Critical Values Present:  Hypertonic Saline started on 01/13 @ 0009 at 50 ml/hr.  01/12 1947 Na = 108 01/12 2336 Na = 110 01/13 Contacted Lab about 0200 level.  Per lab tech, level was "mis-timed" and appeared to be duplicate and therefore was deleted. Next level scheduled to be drawn at ~0520.  Pharmacy continuing to follow. 01/13 0526 Na = 112 (116>117>118) 01/14 0255 Na = 117, stable  01/15 0029 Na = 122 (118 > 119 > 122), slowly trending upward 1/15  1130 Na 125 1/15  1935 Na 131 1/16  0210 Na 130 1/16 0952 Na 131   Plan:  Hypertonic saline was d/c'd on 1/15 @ 1804.  Tolvaptan 15 mg PO X 1 ordered for 1/15 @ 1900, given at 20:40.   Hazem Kenner A Clinical Pharmacist 05/23/2020

## 2020-05-23 NOTE — Progress Notes (Signed)
[facility]  ROUNDING NOTE   Subjective:   Hospital Course:  Patient is 80 years old female was admitted to [facility] on 05/19/2020. Patient presented to the ED with 2-day history of right upper quadrant pain and nausea.  She was not able to eat much and has been drinking clear liquids and water. Nephrology was consulted for the management of hyponatremia and on presentation to ER her sodium level was 107.  She got treated with 3% saline with improvement of sodium to 116. Hypertonic saline was discontinued on 05/21/2020, but restarted later.  Subsequently started on treatment till that time.  -Doing much better.  Serum sodium up to 131.  Objective:  Vital signs in last 24 hours:  Temp:  [98.1 F (36.7 C)-98.5 F (36.9 C)] 98.1 F (36.7 C) (01/16 1533) Pulse Rate:  [85-97] 85 (01/16 1533) Resp:  [16-18] 18 (01/16 1533) BP: (92-115)/(50-67) 92/67 (01/16 1533) SpO2:  [95 %-98 %] 98 % (01/16 1533) Weight:  [83.8 kg-86.5 kg] 86.5 kg (01/16 0143)  Weight change:  Filed Weights   05/19/20 1749 05/22/20 1640 05/23/20 0143  Weight: 81 kg 83.8 kg 86.5 kg    Intake/Output: I/O last 3 completed shifts: In: 326.7 [I.V.:326.7] Out: -    Intake/Output this shift:  Total I/O In: 240 [P.O.:240] Out: 400 [Urine:400]  Physical Exam: General: Resting in bed, in no acute distress  Head: Normocephalic, atraumatic. Moist oral mucosal membranes  Eyes: Anicteric  Lungs:  Respirations symmetrical, unlabored, lungs clear to auscultation  Heart: Regular rate and rhythm  Abdomen:  Soft, nontender, nondistended  Extremities: Trace peripheral edema.  Neurologic: Awake, alert, oriented  Skin: No acute lesions or rashes    Basic Metabolic Panel: Recent Labs  Lab 05/21/20 0255 05/21/20 0834 05/21/20 1620 05/22/20 0309 05/22/20 1130 05/22/20 1935 05/23/20 0210 05/23/20 0952  NA 117* 118*   < > 123*  123* 125* 131*  130* 130* 131*  K 3.9 3.9  --  3.8  --  3.6  --  3.9  CL 85*  86*  --  90*  --  95*  --  96*  CO2 26 25  --  25  --  26  --  28  GLUCOSE 99 101*  --  111*  --  171*  --  144*  BUN 7* 7*  --  7*  --  7*  --  <5*  CREATININE 0.76 0.70  --  0.63  --  0.73  --  0.72  CALCIUM 8.4* 8.5*  --  8.4*  --  8.5*  --  9.0   < > = values in this interval not displayed.    Liver Function Tests: Recent Labs  Lab 05/19/20 1751 05/22/20 1935  AST 38 38  ALT 17 15  ALKPHOS 73 62  BILITOT 2.0* 1.1  PROT 6.7 5.4*  ALBUMIN 3.4* 2.7*   Recent Labs  Lab 05/19/20 1751  LIPASE 41   No results for input(s): AMMONIA in the last 168 hours.  CBC: Recent Labs  Lab 05/19/20 1751 05/19/20 2336  WBC 5.2 6.4  HGB 12.1 10.8*  HCT 32.3* 28.9*  MCV 83.9 83.3  PLT 72* 57*    Cardiac Enzymes: No results for input(s): CKTOTAL, CKMB, CKMBINDEX, TROPONINI in the last 168 hours.  BNP: Invalid input(s): POCBNP  CBG: Recent Labs  Lab 05/22/20 1421 05/22/20 1719 05/22/20 2056 05/23/20 0819 05/23/20 1220  GLUCAP 132* 160* 122* 112* 142*    Microbiology: Results for orders placed  or performed during the hospital encounter of 05/19/20  Urine Culture     Status: Abnormal   Collection Time: 05/19/20 10:36 PM   Specimen: Urine, Clean Catch  Result Value Ref Range Status   Specimen Description   Final    URINE, CLEAN CATCH Performed at [facility], [address], [place], [place] [address]    Special Requests   Final    NONE Performed at [facility], [place], [place] [address]    Culture >=100,000 COLONIES/mL PROTEUS MIRABILIS (A)  Final   Report Status 05/22/2020 FINAL  Final   Organism ID, Bacteria PROTEUS MIRABILIS (A)  Final      Susceptibility   Proteus mirabilis - MIC*    AMPICILLIN <=2 SENSITIVE Sensitive     CEFAZOLIN <=4 SENSITIVE Sensitive     CEFEPIME <=0.12 SENSITIVE Sensitive     CEFTRIAXONE <=0.25 SENSITIVE Sensitive     CIPROFLOXACIN <=0.25 SENSITIVE Sensitive     GENTAMICIN <=1 SENSITIVE  Sensitive     IMIPENEM 2 SENSITIVE Sensitive     NITROFURANTOIN 128 RESISTANT Resistant     TRIMETH/SULFA <=20 SENSITIVE Sensitive     AMPICILLIN/SULBACTAM <=2 SENSITIVE Sensitive     PIP/TAZO <=4 SENSITIVE Sensitive     * >=100,000 COLONIES/mL PROTEUS MIRABILIS  SARS CORONAVIRUS 2 (TAT 6-24 HRS) Nasopharyngeal Nasopharyngeal Swab     Status: None   Collection Time: 05/20/20  7:28 AM   Specimen: Nasopharyngeal Swab  Result Value Ref Range Status   SARS Coronavirus 2 NEGATIVE NEGATIVE Final    Comment: (NOTE) SARS-CoV-2 target nucleic acids are NOT DETECTED.  The SARS-CoV-2 RNA is generally detectable in upper and lower respiratory specimens during the acute phase of infection. Negative results do not preclude SARS-CoV-2 infection, do not rule out co-infections with other pathogens, and should not be used as the sole basis for treatment or other patient management decisions. Negative results must be combined with clinical observations, patient history, and epidemiological information. The expected result is Negative.  Fact Sheet for Patients: SugarRoll.be  Fact Sheet for Healthcare Providers: https://www.woods-mathews.com/  This test is not yet approved or cleared by the [place] FDA and  has been authorized for detection and/or diagnosis of SARS-CoV-2 by FDA under an Emergency Use Authorization (EUA). This EUA will remain  in effect (meaning this test can be used) for the duration of the COVID-19 declaration under Se ction 564(b)(1) of the Act, 21 U.S.C. section 360bbb-3(b)(1), unless the authorization is terminated or revoked sooner.  Performed at [facility], [place] [address], [place], [place] [address]     Coagulation Studies: No results for input(s): LABPROT, INR in the last 72 hours.  Urinalysis: No results for input(s): COLORURINE, LABSPEC, PHURINE, GLUCOSEU, HGBUR, BILIRUBINUR, KETONESUR, PROTEINUR, UROBILINOGEN,  NITRITE, LEUKOCYTESUR in the last 72 hours.  Invalid input(s): APPERANCEUR    Imaging: No results found.   Medications:    . cephALEXin  500 mg Oral Q12H  . enoxaparin (LOVENOX) injection  0.5 mg/kg Subcutaneous Q24H  . feeding supplement (GLUCERNA SHAKE)  237 mL Oral BID PC  . insulin aspart  0-15 Units Subcutaneous TID WC  . insulin aspart  0-5 Units Subcutaneous QHS  . pantoprazole  40 mg Oral BID   acetaminophen **OR** acetaminophen, albuterol, ondansetron **OR** ondansetron (ZOFRAN) IV  Assessment/ Plan:  [name] is a 80 y.o.  female with diabetes, cirrhosis, chronic hyponatremia secondary to [name], Hypertension was admitted on 05/19/2020 with Hyponatremia [E87.1]   #  Hyponatremia Patient has history of chronic hyponatremia possibly due to nonalcoholic cirrhosis. Her baseline sodium level ranging from 129-133. On presentation to the emergency department patient's sodium level was 107.  She reports poor oral intake due to her abdominal pain.  She  was on furosemide at home.  Furosemide was held on 05/21/2020. Initially treated with 3% saline and then transition to tolvaptan. -Serum sodium up to 131 now.  No further need for tolvaptan.  Recommend fluid restriction of 1200 cc per 24 hours and consider adding salt tablets 1 g twice daily.  #Lower extremity edema Patient was on furosemide at home, currently on hold Continue to monitor clinically.  #Diabetes type 2 Patient is on insulin aspart Management per Primary team  #Right upper quadrant pain Denies active pain at the time of our assessment Patient getting evaluated for cholecystectomy secondary to cholelithiasis Surgical team on board   LOS: 4 Shandria Clinch 1/16/20223:54 PM

## 2020-05-23 NOTE — Progress Notes (Signed)
PROGRESS NOTE    Jennifer [name]  TDH:[id number] DOB: Feb 28, 1941 DOA: 05/19/2020 PCP: Mar Daring, PA-C   Brief Narrative: Taken from H&P.  Jennifer [name] is a 80 y.o. female with medical history significant for   DM and cirrhosis with chronic hyponatremia secondary to NASH who presents to the emergency room with a 2-day history of right upper quadrant pain off and on for the past 3 weeks associated with nausea .  Pain is about 6 out of 10, colicky, nonradiating, made worse with oral intake.  She denies cough fever or chills and denies chest pain or shortness of breath. On arrival labs significant for sodium of 107, patient has chronic hyponatremia with baseline around 127.  It was 121 2 weeks ago.  Right upper quadrant ultrasound consistent with cirrhosis, no evidence of acute cholecystitis. She was started on hypertonic saline and nephrology was consulted. Hypertonic saline discontinued today when sodium was 116.  Need to restart hypertonic saline for another day.  Patient also received 1 dose of tolvaptan by nephrology with improvement in her sodium to 131. Nephrology advised to add salt to diet and restrict free water.  UA with pyuria, patient is having increased urinary frequency and dysuria,  Urine cultures with Proteus Mirabella's and good sensitivity, ceftriaxone switched with Keflex to complete a 5-day course.  Subjective: Patient was feeling improved when seen today.  No new complaint. Lower extremity edema improved.  Ready for discharge but will stay another day due to inclement weather.  Assessment & Plan:   Principal Problem:   Hyponatremia Active Problems:   Type 2 diabetes mellitus with unspecified complications (HCC)   Liver cirrhosis secondary to NASH [facility])   Essential hypertension, benign   Chronic hyponatremia  Acute on chronic hyponatremia.  Labs consistent with hypoosmolar hyponatremia.  Patient was drinking quite a bit of free water to keep herself  hydrated.  She has chronic hyponatremia secondary to cirrhosis, using Lasix, significant lower extremity edema.  Mentation at baseline this morning. Patient was placed on hypertonic saline and sodium improved to 116, hypertonic saline was stopped and sodium continue to improve to 118 and then started dropping again, hypertonic saline was again restarted at a lower rate of 35 for 1 day, she also received a dose of tolvaptan 15 mg yesterday.  Sodium improved to 131 today.  -Appreciate nephrology help.  They are advising restricting free water to 1200 cc a day and add salt to diet. -Restrict free water-she can drink Gatorade and Ensure. -Holding Lasix. -Continue to monitor. -PT recommending home health services which were ordered.  Right upper quadrant pain.  No pain this morning but she does have intermittent cramping going on in that area. RUQ ultrasound with 1.2 cm stone within the gallbladder neck with no sonographic Murphy sign, CBD dilated at upper limit at 7 mm.  No evidence of cholecystitis. Patient would like to be evaluated for cholecystectomy secondary to cholelithiasis. -Surgery was consulted and apparently she is not a good candidate for surgery due to her underlying liver condition. -Had another discussion yesterday about going to a tertiary care center for further evaluation and recommendations.  UTI.  Urine with pyuria and patient to have urinary symptoms which includes urinary frequency, urgency and dysuria.  No fever or chills. Urine culture with Proteus mirabilis, with good sensitivity, only resistant to nitrofurantoin.  She was on ceftriaxone. -Switch ceftriaxone with Keflex to complete a 5-day course.  Type 2 diabetes mellitus with unspecified complications (HCC) - Sliding  scale insulin coverage    Liver cirrhosis secondary to NASH ([place]).  Appears compensated. -Continue home spironolactone. -Holding Lasix.  Essential hypertension.  Blood pressure within goal. -Continue  spironolactone and monitor.  Objective: Vitals:   05/23/20 0143 05/23/20 0602 05/23/20 0821 05/23/20 1220  BP:  115/63 (!) 107/50 (!) 104/56  Pulse:  93 86 86  Resp:  18 18 18   Temp:  98.1 F (36.7 C) 98.3 F (36.8 C) 98.4 F (36.9 C)  TempSrc:  Oral Oral Oral  SpO2:  98% 97% 98%  Weight: 86.5 kg     Height:        Intake/Output Summary (Last 24 hours) at 05/23/2020 1432 Last data filed at 05/23/2020 1340 Gross per 24 hour  Intake 240 ml  Output 400 ml  Net -160 ml   Filed Weights   05/19/20 1749 05/22/20 1640 05/23/20 0143  Weight: 81 kg 83.8 kg 86.5 kg    Examination:  General.  Well-developed elderly lady, in no acute distress. Pulmonary.  Lungs clear bilaterally, normal respiratory effort. CV.  Regular rate and rhythm, no JVD, rub or murmur. Abdomen.  Soft, nontender, nondistended, BS positive. CNS.  Alert and oriented x3.  No focal neurologic deficit. Extremities.  Trace LE edema, no cyanosis, pulses intact and symmetrical. Psychiatry.  Judgment and insight appears normal.   DVT prophylaxis: Lovenox Code Status: Full Family Communication: Discussed with Son on Phone. Disposition Plan:  Status is: Inpatient  Remains inpatient appropriate because:Inpatient level of care appropriate due to severity of illness   Dispo: The patient is from: Home              Anticipated d/c is to: Home              Anticipated d/c date is: 1 day              Patient currently is medically stable for discharge.  Will stay another day due to inclement weather.  Consultants:   Nephrology  General surgery  Procedures:  Antimicrobials: Keflex.  Data Reviewed: I have personally reviewed following labs and imaging studies  CBC: Recent Labs  Lab 05/19/20 1751 05/19/20 2336  WBC 5.2 6.4  HGB 12.1 10.8*  HCT 32.3* 28.9*  MCV 83.9 83.3  PLT 72* 57*   Basic Metabolic Panel: Recent Labs  Lab 05/21/20 0255 05/21/20 0834 05/21/20 1620 05/22/20 0309 05/22/20 1130  05/22/20 1935 05/23/20 0210 05/23/20 0952  NA 117* 118*   < > 123*  123* 125* 131*  130* 130* 131*  K 3.9 3.9  --  3.8  --  3.6  --  3.9  CL 85* 86*  --  90*  --  95*  --  96*  CO2 26 25  --  25  --  26  --  28  GLUCOSE 99 101*  --  111*  --  171*  --  144*  BUN 7* 7*  --  7*  --  7*  --  <5*  CREATININE 0.76 0.70  --  0.63  --  0.73  --  0.72  CALCIUM 8.4* 8.5*  --  8.4*  --  8.5*  --  9.0   < > = values in this interval not displayed.   GFR: Estimated Creatinine Clearance: 56 mL/min (by C-G formula based on SCr of 0.72 mg/dL). Liver Function Tests: Recent Labs  Lab 05/19/20 1751 05/22/20 1935  AST 38 38  ALT 17 15  ALKPHOS 73 62  BILITOT  2.0* 1.1  PROT 6.7 5.4*  ALBUMIN 3.4* 2.7*   Recent Labs  Lab 05/19/20 1751  LIPASE 41   No results for input(s): AMMONIA in the last 168 hours. Coagulation Profile: No results for input(s): INR, PROTIME in the last 168 hours. Cardiac Enzymes: No results for input(s): CKTOTAL, CKMB, CKMBINDEX, TROPONINI in the last 168 hours. BNP (last 3 results) No results for input(s): PROBNP in the last 8760 hours. HbA1C: No results for input(s): HGBA1C in the last 72 hours. CBG: Recent Labs  Lab 05/22/20 1421 05/22/20 1719 05/22/20 2056 05/23/20 0819 05/23/20 1220  GLUCAP 132* 160* 122* 112* 142*   Lipid Profile: No results for input(s): CHOL, HDL, LDLCALC, TRIG, CHOLHDL, LDLDIRECT in the last 72 hours. Thyroid Function Tests: No results for input(s): TSH, T4TOTAL, FREET4, T3FREE, THYROIDAB in the last 72 hours. Anemia Panel: No results for input(s): VITAMINB12, FOLATE, FERRITIN, TIBC, IRON, RETICCTPCT in the last 72 hours. Sepsis Labs: No results for input(s): PROCALCITON, LATICACIDVEN in the last 168 hours.  Recent Results (from the past 240 hour(s))  Urine Culture     Status: Abnormal   Collection Time: 05/19/20 10:36 PM   Specimen: Urine, Clean Catch  Result Value Ref Range Status   Specimen Description   Final    URINE,  CLEAN CATCH Performed at [facility], [address] [place], [place] [address]    Special Requests   Final    NONE Performed at [facility], [place], [place] [address]    Culture >=100,000 COLONIES/mL PROTEUS MIRABILIS (A)  Final   Report Status 05/22/2020 FINAL  Final   Organism ID, Bacteria PROTEUS MIRABILIS (A)  Final      Susceptibility   Proteus mirabilis - MIC*    AMPICILLIN <=2 SENSITIVE Sensitive     CEFAZOLIN <=4 SENSITIVE Sensitive     CEFEPIME <=0.12 SENSITIVE Sensitive     CEFTRIAXONE <=0.25 SENSITIVE Sensitive     CIPROFLOXACIN <=0.25 SENSITIVE Sensitive     GENTAMICIN <=1 SENSITIVE Sensitive     IMIPENEM 2 SENSITIVE Sensitive     NITROFURANTOIN 128 RESISTANT Resistant     TRIMETH/SULFA <=20 SENSITIVE Sensitive     AMPICILLIN/SULBACTAM <=2 SENSITIVE Sensitive     PIP/TAZO <=4 SENSITIVE Sensitive     * >=100,000 COLONIES/mL PROTEUS MIRABILIS  SARS CORONAVIRUS 2 (TAT 6-24 HRS) Nasopharyngeal Nasopharyngeal Swab     Status: None   Collection Time: 05/20/20  7:28 AM   Specimen: Nasopharyngeal Swab  Result Value Ref Range Status   SARS Coronavirus 2 NEGATIVE NEGATIVE Final    Comment: (NOTE) SARS-CoV-2 target nucleic acids are NOT DETECTED.  The SARS-CoV-2 RNA is generally detectable in upper and lower respiratory specimens during the acute phase of infection. Negative results do not preclude SARS-CoV-2 infection, do not rule out co-infections with other pathogens, and should not be used as the sole basis for treatment or other patient management decisions. Negative results must be combined with clinical observations, patient history, and epidemiological information. The expected result is Negative.  Fact Sheet for Patients: SugarRoll.be  Fact Sheet for Healthcare Providers: https://www.woods-mathews.com/  This test is not yet approved or cleared by the [place] FDA and  has  been authorized for detection and/or diagnosis of SARS-CoV-2 by FDA under an Emergency Use Authorization (EUA). This EUA will remain  in effect (meaning this test can be used) for the duration of the COVID-19 declaration under Se ction 564(b)(1) of the Act, 21 U.S.C. section 360bbb-3(b)(1), unless the  authorization is terminated or revoked sooner.  Performed at [facility], [place] [address], [place], [place] [address]      Radiology Studies: No results found.  Scheduled Meds: . cephALEXin  500 mg Oral Q12H  . enoxaparin (LOVENOX) injection  0.5 mg/kg Subcutaneous Q24H  . feeding supplement (GLUCERNA SHAKE)  237 mL Oral BID PC  . insulin aspart  0-15 Units Subcutaneous TID WC  . insulin aspart  0-5 Units Subcutaneous QHS  . pantoprazole  40 mg Oral BID   Continuous Infusions:    LOS: 4 days   Time spent: 30 minutes.  Lorella Nimrod, MD [facility]  If 7PM-7AM, please contact night-coverage [email or web address]  05/23/2020, 2:32 PM   This record has been created using Systems analyst. Errors have been sought and corrected,but may not always be located. Such creation errors do not reflect on the standard of care.

## 2020-05-23 NOTE — Progress Notes (Signed)
Pharmacy Electrolyte Replacement - Hypertonic Saline  Recent Labs:  Recent Labs    05/22/20 1935  K 3.6  CREATININE 0.73   Low Critical Values Present:  Hypertonic Saline started on 01/13 @ 0009 at 50 ml/hr.  01/12 1947 Na = 108 01/12 2336 Na = 110 01/13 Contacted Lab about 0200 level.  Per lab tech, level was "mis-timed" and appeared to be duplicate and therefore was deleted. Next level scheduled to be drawn at ~0520.  Pharmacy continuing to follow. 01/13 0526 Na = 112 (116>117>118) 01/14 0255 Na = 117, stable  01/15 0029 Na = 122 (118 > 119 > 122), slowly trending upward 1/15  1130 Na 125 1/15  1935 Na 131 1/16  0210 Na 130  Plan:  Hypertonic saline was d/c'd on 1/15 @ 1804.  Tolvaptan 15 mg PO X 1 ordered for 1/15 @ 1900, given at 20:40.  Will check Na level Q8H X 2 beginning 1/16 @ 0200.   Hart Robinsons A Clinical Pharmacist 05/23/2020

## 2020-05-24 ENCOUNTER — Ambulatory Visit: Payer: Medicare HMO | Admitting: Surgery

## 2020-05-24 DIAGNOSIS — E871 Hypo-osmolality and hyponatremia: Secondary | ICD-10-CM | POA: Diagnosis not present

## 2020-05-24 LAB — BASIC METABOLIC PANEL
Anion gap: 7 (ref 5–15)
BUN: 5 mg/dL — ABNORMAL LOW (ref 8–23)
CO2: 27 mmol/L (ref 22–32)
Calcium: 8.8 mg/dL — ABNORMAL LOW (ref 8.9–10.3)
Chloride: 97 mmol/L — ABNORMAL LOW (ref 98–111)
Creatinine, Ser: 0.76 mg/dL (ref 0.44–1.00)
GFR, Estimated: >60 mL/min (ref >60)
Glucose, Bld: 115 mg/dL — ABNORMAL HIGH (ref 70–99)
Potassium: 3.9 mmol/L (ref 3.5–5.1)
Sodium: 131 mmol/L — ABNORMAL LOW (ref 135–145)

## 2020-05-24 LAB — GLUCOSE, CAPILLARY: Glucose-Capillary: 111 mg/dL — ABNORMAL HIGH (ref 70–99)

## 2020-05-24 NOTE — Progress Notes (Signed)
Physical Therapy Treatment Patient Details Name: Jennifer [name] MRN: [id number] DOB: April 11, 1941 Today's Date: 05/24/2020    History of Present Illness Pt is an 80 y.o. female with medical history significant for DM and cirrhosis with chronic hyponatremia secondary to NASH who presented to the emergency room with a 2-day history of right upper quadrant pain off and on for the past 3 weeks associated with nausea.  MD assessment includes: Acute on chronic hyponatremia, RUQ pain, and UTI.    PT Comments    Patient in chair with son present upon PT arrival. Attempting to don underwear, pants, and shoes. Reports she took her purewick out and peed everywhere. Cleaned floor and assisted patient in donning clothing and shoes. Requires mod A for shoe donning. STS performed with CGA and patient demonstrating safe sequencing and understanding of AD. Upon standing utilized SUE for support and attempted donning underwear and pants rest of way, requiring Min A to perform. Ambulated to door and back safely but slowly with patient's son educated on holding gait belt and safe household mobility. Patient returned to chair with needs met. Current POC remains appropriate at this time.     Follow Up Recommendations  Home health PT;Supervision for mobility/OOB     Equipment Recommendations  None recommended by PT    Recommendations for Other Services       Precautions / Restrictions Precautions Precautions: Fall Precaution Comments: Seizure precautions Restrictions Weight Bearing Restrictions: No    Mobility  Bed Mobility               General bed mobility comments: in chair upon PT arrival.  Transfers Overall transfer level: Needs assistance Equipment used: Rolling walker (2 wheeled) Transfers: Sit to/from Stand Sit to Stand: Supervision;Min guard         General transfer comment: able to perform with safe sequencing and good safety awareness.  Ambulation/Gait Ambulation/Gait  assistance: Min guard Gait Distance (Feet): 40 Feet Assistive device: Rolling walker (2 wheeled) Gait Pattern/deviations: Decreased step length - right;Step-through pattern;Decreased step length - left Gait velocity: decreased   General Gait Details: Pt steady with amb without LOB or buckling   Stairs             Wheelchair Mobility    Modified Rankin (Stroke Patients Only)       Balance Overall balance assessment: Needs assistance Sitting-balance support: No upper extremity supported;Feet supported Sitting balance-Leahy Scale: Good Sitting balance - Comments: able to reach forward and help with donning of shoes.   Standing balance support: During functional activity;Bilateral upper extremity supported Standing balance-Leahy Scale: Fair Standing balance comment: Ambulates with use of RW, able to static stand with SUE support and reach to pull up underwear with CGA                            Cognition Arousal/Alertness: Awake/alert Behavior During Therapy: WFL for tasks assessed/performed Overall Cognitive Status: Within Functional Limits for tasks assessed                                 General Comments: Patient alert and oriented, eager to participate in therapy to return home      Exercises Other Exercises Other Exercises: Patient educated on PT role in acute care setting, safe mobility and transfers, safe donning of underwear, pants, and shoes (education also to son), safe ambulation with RW.  General Comments        Pertinent Vitals/Pain Pain Assessment: No/denies pain    Home Living                      Prior Function            PT Goals (current goals can now be found in the care plan section) Acute Rehab PT Goals Patient Stated Goal: To get healthy PT Goal Formulation: With patient Time For Goal Achievement: 06/04/20 Potential to Achieve Goals: Good Progress towards PT goals: Progressing toward goals     Frequency    Min 2X/week      PT Plan Current plan remains appropriate    Co-evaluation              AM-PAC PT "6 Clicks" Mobility   Outcome Measure  Help needed turning from your back to your side while in a flat bed without using bedrails?: A Little Help needed moving from lying on your back to sitting on the side of a flat bed without using bedrails?: A Little Help needed moving to and from a bed to a chair (including a wheelchair)?: A Little Help needed standing up from a chair using your arms (e.g., wheelchair or bedside chair)?: A Little Help needed to walk in hospital room?: A Little Help needed climbing 3-5 steps with a railing? : A Little 6 Click Score: 18    End of Session Equipment Utilized During Treatment: Gait belt Activity Tolerance: Patient tolerated treatment well Patient left: with call bell/phone within reach;with family/visitor present;in chair Nurse Communication: Mobility status PT Visit Diagnosis: Difficulty in walking, not elsewhere classified (R26.2);Muscle weakness (generalized) (M62.81)     Time: 7530-0511 PT Time Calculation (min) (ACUTE ONLY): 14 min  Charges:  $Therapeutic Activity: 8-22 mins           Janna Arch, PT, DPT             05/24/2020, 11:05 AM

## 2020-05-24 NOTE — Discharge Summary (Signed)
Physician Discharge Summary  Jennifer [name] HBZ:[id number] DOB: Apr 24, 1941 DOA: 05/19/2020  PCP: Mar Daring, PA-C  Admit date: 05/19/2020 Discharge date: 05/24/2020  Admitted From: Home Disposition:  Home  Recommendations for Outpatient Follow-up:  1. Follow up with PCP in 1-2 weeks 2. Please obtain BMP/CBC in one week 3. Please follow up on the following pending results:None  Home Health:Yes Equipment/Devices: Rolling walker Discharge Condition: Stable CODE STATUS: Full Diet recommendation:  Regular/carb modified  Brief/Interim Summary: Jennifer [name] a 80 y.o.femalewith medical history significant forDM and cirrhosiswith chronic hyponatremiasecondary to NASH who presents to the emergency room with a 2-day history of right upper quadrant pain off and on for the past 3 weeksassociated with nausea . Pain is about 6 out of 10, colicky, nonradiating, made worse with oral intake. She denies cough fever or chills and denies chest pain or shortness of breath. On arrival labs significant for sodium of 107, patient has chronic hyponatremia with baseline around 127.  It was 121 2 weeks ago.  Right upper quadrant ultrasound consistent with cirrhosis, no evidence of acute cholecystitis. She was started on hypertonic saline and nephrology was consulted. Hypertonic saline discontinued today when sodium was 116.  Need to restart hypertonic saline for another day.  Patient also received 1 dose of tolvaptan by nephrology with improvement in her sodium to 131. Nephrology advised to add salt to diet and restrict free water. Sodium was stable at 131 on discharge.  UA with pyuria, patient is having increased urinary frequency and dysuria,  Urine cultures with Proteus Mirabella's and good sensitivity, ceftriaxone switched with Keflex, and she completed a 5-day course before discharge.  Patient was also complaining of right upper quadrant pain which is going on for some time.  RUQ  ultrasound with 1.2 cm stone within the gallbladder neck with no sonographic Murphy sign, CBD dilated at upper limit at 7 mm.  No evidence of cholecystitis. Patient would like to be evaluated for cholecystectomy secondary to cholelithiasis. -Surgery was consulted and apparently she is not a good candidate for surgery due to her underlying liver condition. They were advising tertiary care center for further evaluation.  She will continue with rest of her home meds and follow-up with her providers.  Discharge Diagnoses:  Principal Problem:   Hyponatremia Active Problems:   Type 2 diabetes mellitus with unspecified complications (HCC)   Liver cirrhosis secondary to NASH [facility])   Essential hypertension, benign   Chronic hyponatremia  Discharge Instructions  Discharge Instructions    Diet - low sodium heart healthy   Complete by: As directed    Discharge instructions   Complete by: As directed    It was pleasure taking care of you. Use your Lasix as needed for your lower extremity swelling. Add salt to your diet, your nephrologist or primary care provider can prescribe salt tablets if needed. Try restricting your free water to 1200 cc daily. Follow-up with your primary care provider and nephrologist.   Increase activity slowly   Complete by: As directed      Allergies as of 05/24/2020      Reactions   Amoxicillin Other (See Comments)   Yeast infections   Codeine Other (See Comments)   Pt denies   Hydrocodone-acetaminophen Other (See Comments)   Pt denies   Nitrofurantoin Other (See Comments)   Nitrofurantoin Monohyd Macro    "Sugar got high"   Penicillins    Has patient had a PCN reaction causing immediate rash, facial/tongue/throat swelling, SOB or  lightheadedness with hypotension: Unknown Has patient had a PCN reaction causing severe rash involving mucus membranes or skin necrosis: Unknown Has patient had a PCN reaction that required hospitalization: Unknown Has patient had  a PCN reaction occurring within the last 10 years: Unknown If all of the above answers are "NO", then may proceed with Cephalosporin use.   Sulfa Antibiotics Other (See Comments)      Medication List    TAKE these medications   Accu-Chek Aviva Plus test strip Generic drug: glucose blood To check blood sugar daily   Accu-Chek Aviva Plus w/Device Kit To check blood sugar daily   accu-chek soft touch lancets To check blood sugar daily   albuterol 108 (90 Base) MCG/ACT inhaler Commonly known as: VENTOLIN HFA Inhale 1-2 puffs into the lungs every 6 (six) hours as needed for wheezing or shortness of breath.   Fluzone High-Dose Quadrivalent 0.7 ML Susy Generic drug: Influenza Vac High-Dose Quad   furosemide 20 MG tablet Commonly known as: LASIX TAKE ONE TABLET EVERY DAY   Magnesium 200 MG Tabs Take 1 tablet by mouth daily.   nadolol 20 MG tablet Commonly known as: CORGARD TAKE ONE-HALF TABLET BY MOUTH EVERY DAY What changed: when to take this   pantoprazole 40 MG tablet Commonly known as: PROTONIX TAKE ONE TABLET BY MOUTH TWICE DAILY   spironolactone 100 MG tablet Commonly known as: ALDACTONE TAKE ONE TABLET BY MOUTH EVERY DAY       Follow-up Information    Mar Daring, PA-C. Schedule an appointment as soon as possible for a visit.   Specialty: Family Medicine Contact information: [place] [address] [phone]              Allergies  Allergen Reactions  . Amoxicillin Other (See Comments)    Yeast infections  . Codeine Other (See Comments)    Pt denies  . Hydrocodone-Acetaminophen Other (See Comments)    Pt denies  . Nitrofurantoin Other (See Comments)  . Nitrofurantoin Monohyd Macro     "Sugar got high"  . Penicillins     Has patient had a PCN reaction causing immediate rash, facial/tongue/throat swelling, SOB or lightheadedness with hypotension: Unknown Has patient had a PCN reaction causing severe rash  involving mucus membranes or skin necrosis: Unknown Has patient had a PCN reaction that required hospitalization: Unknown Has patient had a PCN reaction occurring within the last 10 years: Unknown If all of the above answers are "NO", then may proceed with Cephalosporin use.  . Sulfa Antibiotics Other (See Comments)    Consultations:  Nephrology  Procedures/Studies: CT Abdomen Pelvis W Contrast  Result Date: 05/14/2020 CLINICAL DATA:  Intermittent mid and right upper quadrant abdominal pain exacerbated by heavy meals since 04/23/2020. Cirrhosis of the liver secondary to NASH. EXAM: CT ABDOMEN AND PELVIS WITH CONTRAST TECHNIQUE: Multidetector CT imaging of the abdomen and pelvis was performed using the standard protocol following bolus administration of intravenous contrast. CONTRAST:  137m OMNIPAQUE IOHEXOL 300 MG/ML  SOLN COMPARISON:  Right upper quadrant abdomen ultrasound dated 02/06/2020. Abdomen and pelvis CT dated 07/24/2018. FINDINGS: Lower chest: A partially included rounded ground-glass nodule is demonstrated in the anterior aspect of the right lower lobe on the 1st image, measuring 10 mm in maximum diameter, without significant change since 07/24/2018 and 08/09/2017. Hepatobiliary: Diffuse low density and mild heterogeneity of the liver with a small right lobe, enlarged lateral segment left lobe and caudate lobe and diffusely irregular contours. Stable cyst anteriorly  on the right measuring 8 mm in maximum diameter on image number 14 series 2. Multiple small calcified granulomata. Dilated gallbladder with diffuse wall thickening without pericholecystic fluid. Previously demonstrated gallstone in the neck of the gallbladder, measuring 1.1 cm in maximum diameter. Pancreas: Moderate diffuse pancreatic atrophy. Spleen: Mildly enlarged, measuring 13.8 cm in length, without significant change. Adrenals/Urinary Tract: Small amount of air in the urinary bladder, suggesting recent catheterization.  Unremarkable adrenal glands, kidneys and ureters. Stomach/Bowel: Mildly dilated duodenum and proximal jejunum, similar to 07/24/2018. Normal passage of oral contrast through to the rectum. No obstructing mass seen. No evidence of appendicitis. Vascular/Lymphatic: Multiple abdominal varices. Small, recanalized umbilical vein. Minimal atheromatous arterial calcification. No enlarged lymph nodes. Reproductive: Status post hysterectomy. No adnexal masses. Other: Small umbilical hernia containing fat. Moderate amount of free peritoneal fluid, significantly increased since 07/24/2018. Musculoskeletal: Lumbar and lower thoracic spine degenerative changes and mild scoliosis. Stable old 30% L1 vertebral compression deformity and L2 superior endplate Schmorl's node formation. No pars defects or subluxations. IMPRESSION: 1. Moderate amount of free peritoneal fluid, significantly increased since 07/24/2018. 2. Stable changes of cirrhosis of the liver with portal venous hypertension, including mild splenomegaly and multiple abdominal varices. 3. Dilated gallbladder with diffuse wall thickening and 1.1 cm stone lodged in the neck of the gallbladder. These most likely represent changes due to chronic cholecystitis. 4. Stable mildly dilated duodenum and proximal jejunum, similar to 07/24/2018. This could be due to mild ileus or chronic mild partial obstruction. 5. Stable 10 mm right lower lobe ground-glass nodule. Repeat CT is recommended every 2 years until 5 years of stability has been established. This recommendation follows the consensus statement: Guidelines for Management of Incidental Pulmonary Nodules Detected on CT Images: From the Fleischner Society 2017; Radiology 2017; 284:228-243. 6. Small amount of air in the urinary bladder, suggesting recent catheterization. Electronically Signed   By: Claudie Revering M.D.   On: 05/14/2020 08:45   US Abdomen Limited RUQ (LIVER/GB)  Result Date: 05/19/2020 CLINICAL DATA:   Cholelithiasis EXAM: ULTRASOUND ABDOMEN LIMITED RIGHT UPPER QUADRANT COMPARISON:  CT 05/14/2020 FINDINGS: Gallbladder: 1.2 cm stone noted within the gallbladder neck. Sludge within the gallbladder. No wall thickening. No reported sonographic Murphy sign. Common bile duct: Diameter: Upper limits normal in diameter, 7 mm. Distal duct is not visualized due to overlying bowel gas. Liver: Coarsened, increased echotexture throughout the liver. Nodular contours and shrunken appearance. Findings compatible with cirrhosis. No flow seen within the portal vein concerning for portal venous thrombosis. Other: Moderate to large volume ascites. IMPRESSION: Changes of cirrhosis with nodular shrunken liver and associated ascites. No flow seen within the portal vein concerning for portal venous thrombosis. Cholelithiasis.  No sonographic evidence of acute cholecystitis. Electronically Signed   By: Rolm Baptise M.D.   On: 05/19/2020 20:18     Subjective: Patient was feeling better when seen today.  Sitting in chair.  Son at bedside.  No new complaint.  Discharge Exam: Vitals:   05/24/20 0511 05/24/20 0722  BP: (!) 122/57 (!) 105/45  Pulse: 88 88  Resp:    Temp: 98.5 F (36.9 C) 98 F (36.7 C)  SpO2: 96% 96%   Vitals:   05/23/20 2131 05/24/20 0235 05/24/20 0511 05/24/20 0722  BP: (!) 122/56  (!) 122/57 (!) 105/45  Pulse: 87  88 88  Resp: 20     Temp: 98.6 F (37 C)  98.5 F (36.9 C) 98 F (36.7 C)  TempSrc: Oral  Oral Oral  SpO2: 99%  96% 96%  Weight:  84.1 kg    Height:        General: Pt is alert, awake, not in acute distress Cardiovascular: RRR, S1/S2 +, no rubs, no gallops Respiratory: CTA bilaterally, no wheezing, no rhonchi Abdominal: Soft, NT, ND, bowel sounds + Extremities: no edema, no cyanosis   The results of significant diagnostics from this hospitalization (including imaging, microbiology, ancillary and laboratory) are listed below for reference.    Microbiology: Recent Results  (from the past 240 hour(s))  Urine Culture     Status: Abnormal   Collection Time: 05/19/20 10:36 PM   Specimen: Urine, Clean Catch  Result Value Ref Range Status   Specimen Description   Final    URINE, CLEAN CATCH Performed at [facility], [address], [place], [place] [address]    Special Requests   Final    NONE Performed at [facility], [place], [place] [address]    Culture >=100,000 COLONIES/mL PROTEUS MIRABILIS (A)  Final   Report Status 05/22/2020 FINAL  Final   Organism ID, Bacteria PROTEUS MIRABILIS (A)  Final      Susceptibility   Proteus mirabilis - MIC*    AMPICILLIN <=2 SENSITIVE Sensitive     CEFAZOLIN <=4 SENSITIVE Sensitive     CEFEPIME <=0.12 SENSITIVE Sensitive     CEFTRIAXONE <=0.25 SENSITIVE Sensitive     CIPROFLOXACIN <=0.25 SENSITIVE Sensitive     GENTAMICIN <=1 SENSITIVE Sensitive     IMIPENEM 2 SENSITIVE Sensitive     NITROFURANTOIN 128 RESISTANT Resistant     TRIMETH/SULFA <=20 SENSITIVE Sensitive     AMPICILLIN/SULBACTAM <=2 SENSITIVE Sensitive     PIP/TAZO <=4 SENSITIVE Sensitive     * >=100,000 COLONIES/mL PROTEUS MIRABILIS  SARS CORONAVIRUS 2 (TAT 6-24 HRS) Nasopharyngeal Nasopharyngeal Swab     Status: None   Collection Time: 05/20/20  7:28 AM   Specimen: Nasopharyngeal Swab  Result Value Ref Range Status   SARS Coronavirus 2 NEGATIVE NEGATIVE Final    Comment: (NOTE) SARS-CoV-2 target nucleic acids are NOT DETECTED.  The SARS-CoV-2 RNA is generally detectable in upper and lower respiratory specimens during the acute phase of infection. Negative results do not preclude SARS-CoV-2 infection, do not rule out co-infections with other pathogens, and should not be used as the sole basis for treatment or other patient management decisions. Negative results must be combined with clinical observations, patient history, and epidemiological information. The expected result is Negative.  Fact Sheet for  Patients: SugarRoll.be  Fact Sheet for Healthcare Providers: https://www.woods-mathews.com/  This test is not yet approved or cleared by the [place] FDA and  has been authorized for detection and/or diagnosis of SARS-CoV-2 by FDA under an Emergency Use Authorization (EUA). This EUA will remain  in effect (meaning this test can be used) for the duration of the COVID-19 declaration under Se ction 564(b)(1) of the Act, 21 U.S.C. section 360bbb-3(b)(1), unless the authorization is terminated or revoked sooner.  Performed at [facility], [place] [address], [place], [place] [address]      Labs: BNP (last 3 results) No results for input(s): BNP in the last 8760 hours. Basic Metabolic Panel: Recent Labs  Lab 05/21/20 0834 05/21/20 1620 05/22/20 0309 05/22/20 1130 05/22/20 1935 05/23/20 0210 05/23/20 0952 05/24/20 0905  NA 118*   < > 123*  123* 125* 131*  130* 130* 131* 131*  K 3.9  --  3.8  --  3.6  --  3.9 3.9  CL 86*  --  90*  --  95*  --  96* 97*  CO2 25  --  25  --  26  --  28 27  GLUCOSE 101*  --  111*  --  171*  --  144* 115*  BUN 7*  --  7*  --  7*  --  <5* 5*  CREATININE 0.70  --  0.63  --  0.73  --  0.72 0.76  CALCIUM 8.5*  --  8.4*  --  8.5*  --  9.0 8.8*   < > = values in this interval not displayed.   Liver Function Tests: Recent Labs  Lab 05/19/20 1751 05/22/20 1935  AST 38 38  ALT 17 15  ALKPHOS 73 62  BILITOT 2.0* 1.1  PROT 6.7 5.4*  ALBUMIN 3.4* 2.7*   Recent Labs  Lab 05/19/20 1751  LIPASE 41   No results for input(s): AMMONIA in the last 168 hours. CBC: Recent Labs  Lab 05/19/20 1751 05/19/20 2336  WBC 5.2 6.4  HGB 12.1 10.8*  HCT 32.3* 28.9*  MCV 83.9 83.3  PLT 72* 57*   Cardiac Enzymes: No results for input(s): CKTOTAL, CKMB, CKMBINDEX, TROPONINI in the last 168 hours. BNP: Invalid input(s): POCBNP CBG: Recent Labs  Lab 05/23/20 0819 05/23/20 1220 05/23/20 1808  05/23/20 2128 05/24/20 0723  GLUCAP 112* 142* 128* 115* 111*   D-Dimer No results for input(s): DDIMER in the last 72 hours. Hgb A1c No results for input(s): HGBA1C in the last 72 hours. Lipid Profile No results for input(s): CHOL, HDL, LDLCALC, TRIG, CHOLHDL, LDLDIRECT in the last 72 hours. Thyroid function studies No results for input(s): TSH, T4TOTAL, T3FREE, THYROIDAB in the last 72 hours.  Invalid input(s): FREET3 Anemia work up No results for input(s): VITAMINB12, FOLATE, FERRITIN, TIBC, IRON, RETICCTPCT in the last 72 hours. Urinalysis    Component Value Date/Time   COLORURINE AMBER (A) 05/20/2020 0400   APPEARANCEUR CLOUDY (A) 05/20/2020 0400   LABSPEC 1.014 05/20/2020 0400   PHURINE 8.0 05/20/2020 0400   GLUCOSEU NEGATIVE 05/20/2020 0400   HGBUR SMALL (A) 05/20/2020 0400   BILIRUBINUR NEGATIVE 05/20/2020 0400   BILIRUBINUR negative 01/16/2017 0922   KETONESUR NEGATIVE 05/20/2020 0400   PROTEINUR 30 (A) 05/20/2020 0400   UROBILINOGEN 0.2 01/16/2017 0922   NITRITE NEGATIVE 05/20/2020 0400   LEUKOCYTESUR LARGE (A) 05/20/2020 0400   Sepsis Labs Invalid input(s): PROCALCITONIN,  WBC,  LACTICIDVEN Microbiology Recent Results (from the past 240 hour(s))  Urine Culture     Status: Abnormal   Collection Time: 05/19/20 10:36 PM   Specimen: Urine, Clean Catch  Result Value Ref Range Status   Specimen Description   Final    URINE, CLEAN CATCH Performed at [facility], [address] [place], [place] [address]    Special Requests   Final    NONE Performed at [facility], [address] [place], [place] [address]    Culture >=100,000 COLONIES/mL PROTEUS MIRABILIS (A)  Final   Report Status 05/22/2020 FINAL  Final   Organism ID, Bacteria PROTEUS MIRABILIS (A)  Final      Susceptibility   Proteus mirabilis - MIC*    AMPICILLIN <=2 SENSITIVE Sensitive     CEFAZOLIN <=4 SENSITIVE Sensitive     CEFEPIME <=0.12 SENSITIVE Sensitive     CEFTRIAXONE  <=0.25 SENSITIVE Sensitive     CIPROFLOXACIN <=0.25 SENSITIVE Sensitive     GENTAMICIN <=1 SENSITIVE Sensitive     IMIPENEM 2  SENSITIVE Sensitive     NITROFURANTOIN 128 RESISTANT Resistant     TRIMETH/SULFA <=20 SENSITIVE Sensitive     AMPICILLIN/SULBACTAM <=2 SENSITIVE Sensitive     PIP/TAZO <=4 SENSITIVE Sensitive     * >=100,000 COLONIES/mL PROTEUS MIRABILIS  SARS CORONAVIRUS 2 (TAT 6-24 HRS) Nasopharyngeal Nasopharyngeal Swab     Status: None   Collection Time: 05/20/20  7:28 AM   Specimen: Nasopharyngeal Swab  Result Value Ref Range Status   SARS Coronavirus 2 NEGATIVE NEGATIVE Final    Comment: (NOTE) SARS-CoV-2 target nucleic acids are NOT DETECTED.  The SARS-CoV-2 RNA is generally detectable in upper and lower respiratory specimens during the acute phase of infection. Negative results do not preclude SARS-CoV-2 infection, do not rule out co-infections with other pathogens, and should not be used as the sole basis for treatment or other patient management decisions. Negative results must be combined with clinical observations, patient history, and epidemiological information. The expected result is Negative.  Fact Sheet for Patients: SugarRoll.be  Fact Sheet for Healthcare Providers: https://www.woods-mathews.com/  This test is not yet approved or cleared by the [place] FDA and  has been authorized for detection and/or diagnosis of SARS-CoV-2 by FDA under an Emergency Use Authorization (EUA). This EUA will remain  in effect (meaning this test can be used) for the duration of the COVID-19 declaration under Se ction 564(b)(1) of the Act, 21 U.S.C. section 360bbb-3(b)(1), unless the authorization is terminated or revoked sooner.  Performed at [facility], [place] [address], [place], [place] [address]     Time coordinating discharge: Over 30 minutes  SIGNED:  Lorella Nimrod, MD  [facility] 05/24/2020, 11:00  AM  If 7PM-7AM, please contact night-coverage [email or web address]  This record has been created using Systems analyst. Errors have been sought and corrected,but may not always be located. Such creation errors do not reflect on the standard of care.

## 2020-05-24 NOTE — Care Management Important Message (Addendum)
Important Message  Patient Details  Name: Jennifer [name] MRN: [id number] Date of Birth: 04-May-1941   Medicare Important Message Given:  No  Patient discharged prior to arrival to unit to deliver concurrent Medicare IM.  Initial Medicare IM reviewed with daughter Jennifer [name] on 05/19/20 by Legrand Rams, Patient Access Associate.   Dannette Barbara 05/24/2020, 12:14 PM

## 2020-05-24 NOTE — Progress Notes (Signed)
[facility]  ROUNDING NOTE   Subjective:   Hospital Course:  Patient is 80 years old female was admitted to [facility] on 05/19/2020. Patient presented to the ED with 2-day history of right upper quadrant pain and nausea.  She was not able to eat much and has been drinking clear liquids and water. Nephrology was consulted for the management of hyponatremia and on presentation to ER her sodium level was 107.  She got treated with 3% saline with improvement of sodium to 116. Hypertonic saline was discontinued on 05/21/2020, but restarted later.  Subsequently started on treatment till that time.  Daily update: Serum sodium remained stable at 131.  Tolvaptan had good effect.  Patient in good spirits.  Objective:  Vital signs in last 24 hours:  Temp:  [98 F (36.7 C)-98.6 F (37 C)] 98 F (36.7 C) (01/17 0722) Pulse Rate:  [85-88] 88 (01/17 0722) Resp:  [18-20] 20 (01/16 2131) BP: (92-122)/(45-67) 105/45 (01/17 0722) SpO2:  [96 %-99 %] 96 % (01/17 0722) Weight:  [84.1 kg] 84.1 kg (01/17 0235)  Weight change: 0.227 kg Filed Weights   05/22/20 1640 05/23/20 0143 05/24/20 0235  Weight: 83.8 kg 86.5 kg 84.1 kg    Intake/Output: I/O last 3 completed shifts: In: 240 [P.O.:240] Out: 1100 [Urine:1100]   Intake/Output this shift:  Total I/O In: 360 [P.O.:360] Out: -   Physical Exam: General: Resting in bed, in no acute distress  Head: Normocephalic, atraumatic. Moist oral mucosal membranes  Eyes: Anicteric  Lungs:  Clear bilateral, normal effort  Heart: Regular rate and rhythm  Abdomen:  Soft, nontender, nondistended  Extremities: Trace peripheral edema.  Neurologic: Awake, alert, oriented  Skin: No acute lesions or rashes    Basic Metabolic Panel: Recent Labs  Lab 05/21/20 0834 05/21/20 1620 05/22/20 0309 05/22/20 1130 05/22/20 1935 05/23/20 0210 05/23/20 0952 05/24/20 0905  NA 118*   < > 123*  123* 125* 131*  130* 130* 131* 131*  K 3.9  --  3.8  --  3.6   --  3.9 3.9  CL 86*  --  90*  --  95*  --  96* 97*  CO2 25  --  25  --  26  --  28 27  GLUCOSE 101*  --  111*  --  171*  --  144* 115*  BUN 7*  --  7*  --  7*  --  <5* 5*  CREATININE 0.70  --  0.63  --  0.73  --  0.72 0.76  CALCIUM 8.5*  --  8.4*  --  8.5*  --  9.0 8.8*   < > = values in this interval not displayed.    Liver Function Tests: Recent Labs  Lab 05/19/20 1751 05/22/20 1935  AST 38 38  ALT 17 15  ALKPHOS 73 62  BILITOT 2.0* 1.1  PROT 6.7 5.4*  ALBUMIN 3.4* 2.7*   Recent Labs  Lab 05/19/20 1751  LIPASE 41   No results for input(s): AMMONIA in the last 168 hours.  CBC: Recent Labs  Lab 05/19/20 1751 05/19/20 2336  WBC 5.2 6.4  HGB 12.1 10.8*  HCT 32.3* 28.9*  MCV 83.9 83.3  PLT 72* 57*    Cardiac Enzymes: No results for input(s): CKTOTAL, CKMB, CKMBINDEX, TROPONINI in the last 168 hours.  BNP: Invalid input(s): POCBNP  CBG: Recent Labs  Lab 05/23/20 0819 05/23/20 1220 05/23/20 1808 05/23/20 2128 05/24/20 0723  GLUCAP 112* 142* 128* 115* 111*  Microbiology: Results for orders placed or performed during the hospital encounter of 05/19/20  Urine Culture     Status: Abnormal   Collection Time: 05/19/20 10:36 PM   Specimen: Urine, Clean Catch  Result Value Ref Range Status   Specimen Description   Final    URINE, CLEAN CATCH Performed at [facility], [address], [place], [place] [address]    Special Requests   Final    NONE Performed at [facility], [place], [place] [address]    Culture >=100,000 COLONIES/mL PROTEUS MIRABILIS (A)  Final   Report Status 05/22/2020 FINAL  Final   Organism ID, Bacteria PROTEUS MIRABILIS (A)  Final      Susceptibility   Proteus mirabilis - MIC*    AMPICILLIN <=2 SENSITIVE Sensitive     CEFAZOLIN <=4 SENSITIVE Sensitive     CEFEPIME <=0.12 SENSITIVE Sensitive     CEFTRIAXONE <=0.25 SENSITIVE Sensitive     CIPROFLOXACIN <=0.25 SENSITIVE Sensitive     GENTAMICIN  <=1 SENSITIVE Sensitive     IMIPENEM 2 SENSITIVE Sensitive     NITROFURANTOIN 128 RESISTANT Resistant     TRIMETH/SULFA <=20 SENSITIVE Sensitive     AMPICILLIN/SULBACTAM <=2 SENSITIVE Sensitive     PIP/TAZO <=4 SENSITIVE Sensitive     * >=100,000 COLONIES/mL PROTEUS MIRABILIS  SARS CORONAVIRUS 2 (TAT 6-24 HRS) Nasopharyngeal Nasopharyngeal Swab     Status: None   Collection Time: 05/20/20  7:28 AM   Specimen: Nasopharyngeal Swab  Result Value Ref Range Status   SARS Coronavirus 2 NEGATIVE NEGATIVE Final    Comment: (NOTE) SARS-CoV-2 target nucleic acids are NOT DETECTED.  The SARS-CoV-2 RNA is generally detectable in upper and lower respiratory specimens during the acute phase of infection. Negative results do not preclude SARS-CoV-2 infection, do not rule out co-infections with other pathogens, and should not be used as the sole basis for treatment or other patient management decisions. Negative results must be combined with clinical observations, patient history, and epidemiological information. The expected result is Negative.  Fact Sheet for Patients: SugarRoll.be  Fact Sheet for Healthcare Providers: https://www.woods-mathews.com/  This test is not yet approved or cleared by the [place] FDA and  has been authorized for detection and/or diagnosis of SARS-CoV-2 by FDA under an Emergency Use Authorization (EUA). This EUA will remain  in effect (meaning this test can be used) for the duration of the COVID-19 declaration under Se ction 564(b)(1) of the Act, 21 U.S.C. section 360bbb-3(b)(1), unless the authorization is terminated or revoked sooner.  Performed at [facility], [place] [address] [place], [place] [address]     Coagulation Studies: No results for input(s): LABPROT, INR in the last 72 hours.  Urinalysis: No results for input(s): COLORURINE, LABSPEC, PHURINE, GLUCOSEU, HGBUR, BILIRUBINUR, KETONESUR, PROTEINUR,  UROBILINOGEN, NITRITE, LEUKOCYTESUR in the last 72 hours.  Invalid input(s): APPERANCEUR    Imaging: No results found.   Medications:    . enoxaparin (LOVENOX) injection  0.5 mg/kg Subcutaneous Q24H  . feeding supplement (GLUCERNA SHAKE)  237 mL Oral BID PC  . insulin aspart  0-15 Units Subcutaneous TID WC  . insulin aspart  0-5 Units Subcutaneous QHS  . pantoprazole  40 mg Oral BID   acetaminophen **OR** acetaminophen, albuterol, docusate sodium, ondansetron **OR** ondansetron (ZOFRAN) IV  Assessment/ Plan:  [name] is a 80 y.o.  female with diabetes, cirrhosis, chronic hyponatremia secondary to [name], Hypertension was admitted on 05/19/2020 with Hyponatremia [E87.1]   #  Hyponatremia Patient has history of chronic hyponatremia possibly due to nonalcoholic cirrhosis. Her baseline sodium level ranging from 129-133. On presentation to the emergency department patient's sodium level was 107.  She reports poor oral intake due to her abdominal pain.  She  was on furosemide at home.  Furosemide was held on 05/21/2020. Initially treated with 3% saline and then transition to tolvaptan. -Patient had good response to tolvaptan.  Serum sodium 131.  We will continue to monitor this as an outpatient.  #Lower extremity edema Reevaluate lower extremity edema as an outpatient.  #Diabetes type 2 Patient is on insulin aspart Management per Primary team    LOS: 5 Jennifer [name] 1/17/20223:06 PM

## 2020-05-25 ENCOUNTER — Telehealth: Payer: Self-pay

## 2020-05-25 NOTE — Telephone Encounter (Signed)
Next apt scheduled is for 06/07/20.

## 2020-05-25 NOTE — Telephone Encounter (Signed)
Transition Care Management Follow-up Telephone Call  Date of discharge and from where: [facility] on 05/24/20  How have you been since you were released from the hospital? Doing good and feels better. Pt states she believe she was sent home too early though because she had no strength yesterday when she was d/c but today she feels a little bit stronger. Pt was unable to have a BM in the hospital but once she came home she had 2-3 episodes and 1 today. BM was normal. Appetite is good and pt rested well last night. Declines pain, fever, SOB or n/v/d.  Any questions or concerns? Pt is concerned about getting weak and shaky again. Pt is concerned that she wont be able to stop herself from having another episode.   Items Reviewed:  Did the pt receive and understand the discharge instructions provided? Yes   Medications obtained and verified? Declined, pt wants to verify on virtual HFU call tomorrow  Any new allergies since your discharge? No   Dietary orders reviewed? Yes  Do you have support at home? Yes   Other (ie: DME, Home Health, etc): N/A  Functional Questionnaire: (I = Independent and D = Dependent)  Bathing/Dressing- I   Meal Prep- I  Eating- I  Maintaining continence- I  Transferring/Ambulation- D, using a walker currently.  Managing Meds- I   Follow up appointments reviewed:    PCP Hospital f/u appt confirmed? Yes  scheduled for a virtual visit with [name] tomorrow, 05/26/20 @ 10:00 AM.  [facility] f/u appt confirmed? N/A  Are transportation arrangements needed? No   If their condition worsens, is the pt aware to call  their PCP or go to the ED? Yes  Was the patient provided with contact information for the PCP's office or ED? Yes  Was the pt encouraged to call back with questions or concerns? Yes

## 2020-05-26 ENCOUNTER — Emergency Department: Payer: Medicare HMO

## 2020-05-26 ENCOUNTER — Telehealth (INDEPENDENT_AMBULATORY_CARE_PROVIDER_SITE_OTHER): Payer: Medicare HMO | Admitting: Physician Assistant

## 2020-05-26 ENCOUNTER — Other Ambulatory Visit: Payer: Self-pay

## 2020-05-26 ENCOUNTER — Inpatient Hospital Stay
Admission: EM | Admit: 2020-05-26 | Discharge: 2020-05-31 | DRG: 441 | Disposition: A | Discharge status: Home Health | Payer: Medicare HMO | Attending: Internal Medicine | Admitting: Internal Medicine

## 2020-05-26 VITALS — HR 68

## 2020-05-26 DIAGNOSIS — Z87440 Personal history of urinary (tract) infections: Secondary | ICD-10-CM | POA: Diagnosis not present

## 2020-05-26 DIAGNOSIS — I8510 Secondary esophageal varices without bleeding: Secondary | ICD-10-CM | POA: Diagnosis present

## 2020-05-26 DIAGNOSIS — E878 Other disorders of electrolyte and fluid balance, not elsewhere classified: Secondary | ICD-10-CM

## 2020-05-26 DIAGNOSIS — E871 Hypo-osmolality and hyponatremia: Secondary | ICD-10-CM

## 2020-05-26 DIAGNOSIS — I4581 Long QT syndrome: Secondary | ICD-10-CM | POA: Diagnosis not present

## 2020-05-26 DIAGNOSIS — K7460 Unspecified cirrhosis of liver: Secondary | ICD-10-CM

## 2020-05-26 DIAGNOSIS — M7989 Other specified soft tissue disorders: Secondary | ICD-10-CM | POA: Diagnosis not present

## 2020-05-26 DIAGNOSIS — E118 Type 2 diabetes mellitus with unspecified complications: Secondary | ICD-10-CM | POA: Diagnosis not present

## 2020-05-26 DIAGNOSIS — K8020 Calculus of gallbladder without cholecystitis without obstruction: Secondary | ICD-10-CM | POA: Diagnosis present

## 2020-05-26 DIAGNOSIS — R188 Other ascites: Secondary | ICD-10-CM | POA: Diagnosis not present

## 2020-05-26 DIAGNOSIS — K7290 Hepatic failure, unspecified without coma: Secondary | ICD-10-CM | POA: Diagnosis not present

## 2020-05-26 DIAGNOSIS — I472 Ventricular tachycardia: Secondary | ICD-10-CM | POA: Diagnosis present

## 2020-05-26 DIAGNOSIS — D696 Thrombocytopenia, unspecified: Secondary | ICD-10-CM | POA: Diagnosis not present

## 2020-05-26 DIAGNOSIS — D6959 Other secondary thrombocytopenia: Secondary | ICD-10-CM | POA: Diagnosis present

## 2020-05-26 DIAGNOSIS — K7581 Nonalcoholic steatohepatitis (NASH): Secondary | ICD-10-CM | POA: Diagnosis not present

## 2020-05-26 DIAGNOSIS — K219 Gastro-esophageal reflux disease without esophagitis: Secondary | ICD-10-CM | POA: Diagnosis present

## 2020-05-26 DIAGNOSIS — Z87891 Personal history of nicotine dependence: Secondary | ICD-10-CM | POA: Diagnosis not present

## 2020-05-26 DIAGNOSIS — Z86010 Personal history of colonic polyps: Secondary | ICD-10-CM

## 2020-05-26 DIAGNOSIS — K8010 Calculus of gallbladder with chronic cholecystitis without obstruction: Secondary | ICD-10-CM | POA: Diagnosis present

## 2020-05-26 DIAGNOSIS — Z20822 Contact with and (suspected) exposure to covid-19: Secondary | ICD-10-CM | POA: Diagnosis present

## 2020-05-26 DIAGNOSIS — I081 Rheumatic disorders of both mitral and tricuspid valves: Secondary | ICD-10-CM | POA: Diagnosis present

## 2020-05-26 DIAGNOSIS — R079 Chest pain, unspecified: Secondary | ICD-10-CM | POA: Diagnosis not present

## 2020-05-26 DIAGNOSIS — I361 Nonrheumatic tricuspid (valve) insufficiency: Secondary | ICD-10-CM | POA: Diagnosis not present

## 2020-05-26 DIAGNOSIS — Z79899 Other long term (current) drug therapy: Secondary | ICD-10-CM

## 2020-05-26 DIAGNOSIS — R4182 Altered mental status, unspecified: Secondary | ICD-10-CM | POA: Diagnosis present

## 2020-05-26 DIAGNOSIS — I959 Hypotension, unspecified: Secondary | ICD-10-CM | POA: Diagnosis not present

## 2020-05-26 DIAGNOSIS — I10 Essential (primary) hypertension: Secondary | ICD-10-CM | POA: Diagnosis not present

## 2020-05-26 DIAGNOSIS — Z885 Allergy status to narcotic agent status: Secondary | ICD-10-CM

## 2020-05-26 DIAGNOSIS — E785 Hyperlipidemia, unspecified: Secondary | ICD-10-CM | POA: Diagnosis present

## 2020-05-26 DIAGNOSIS — R1030 Lower abdominal pain, unspecified: Secondary | ICD-10-CM | POA: Diagnosis present

## 2020-05-26 DIAGNOSIS — G9341 Metabolic encephalopathy: Secondary | ICD-10-CM | POA: Diagnosis present

## 2020-05-26 DIAGNOSIS — Z880 Allergy status to penicillin: Secondary | ICD-10-CM

## 2020-05-26 DIAGNOSIS — R000 Tachycardia, unspecified: Secondary | ICD-10-CM | POA: Diagnosis not present

## 2020-05-26 DIAGNOSIS — M1990 Unspecified osteoarthritis, unspecified site: Secondary | ICD-10-CM | POA: Diagnosis present

## 2020-05-26 DIAGNOSIS — Z888 Allergy status to other drugs, medicaments and biological substances status: Secondary | ICD-10-CM

## 2020-05-26 DIAGNOSIS — K7469 Other cirrhosis of liver: Secondary | ICD-10-CM | POA: Diagnosis present

## 2020-05-26 DIAGNOSIS — Z90710 Acquired absence of both cervix and uterus: Secondary | ICD-10-CM | POA: Diagnosis not present

## 2020-05-26 DIAGNOSIS — R410 Disorientation, unspecified: Secondary | ICD-10-CM | POA: Diagnosis not present

## 2020-05-26 DIAGNOSIS — Z882 Allergy status to sulfonamides status: Secondary | ICD-10-CM

## 2020-05-26 DIAGNOSIS — J45909 Unspecified asthma, uncomplicated: Secondary | ICD-10-CM | POA: Diagnosis present

## 2020-05-26 LAB — RESP PANEL BY RT-PCR (FLU A&B, COVID) ARPGX2
Influenza A by PCR: NEGATIVE
Influenza B by PCR: NEGATIVE
SARS Coronavirus 2 by RT PCR: NEGATIVE

## 2020-05-26 LAB — COMPREHENSIVE METABOLIC PANEL
ALT: 17 U/L (ref 0–44)
AST: 44 U/L — ABNORMAL HIGH (ref 15–41)
Albumin: 3.1 g/dL — ABNORMAL LOW (ref 3.5–5.0)
Alkaline Phosphatase: 80 U/L (ref 38–126)
Anion gap: 10 (ref 5–15)
BUN: 10 mg/dL (ref 8–23)
CO2: 28 mmol/L (ref 22–32)
Calcium: 9 mg/dL (ref 8.9–10.3)
Chloride: 89 mmol/L — ABNORMAL LOW (ref 98–111)
Creatinine, Ser: 0.77 mg/dL (ref 0.44–1.00)
GFR, Estimated: >60 mL/min (ref >60)
Glucose, Bld: 142 mg/dL — ABNORMAL HIGH (ref 70–99)
Potassium: 3.8 mmol/L (ref 3.5–5.1)
Sodium: 127 mmol/L — ABNORMAL LOW (ref 135–145)
Total Bilirubin: 2 mg/dL — ABNORMAL HIGH (ref 0.3–1.2)
Total Protein: 6.2 g/dL — ABNORMAL LOW (ref 6.5–8.1)

## 2020-05-26 LAB — CBC
HCT: 34.6 % — ABNORMAL LOW (ref 36.0–46.0)
Hemoglobin: 11.9 g/dL — ABNORMAL LOW (ref 12.0–15.0)
MCH: 31.3 pg (ref 26.0–34.0)
MCHC: 34.4 g/dL (ref 30.0–36.0)
MCV: 91.1 fL (ref 80.0–100.0)
Platelets: 92 10*3/uL — ABNORMAL LOW (ref 150–400)
RBC: 3.8 MIL/uL — ABNORMAL LOW (ref 3.87–5.11)
RDW: 15.6 % — ABNORMAL HIGH (ref 11.5–15.5)
WBC: 6.2 10*3/uL (ref 4.0–10.5)
nRBC: 0 % (ref 0.0–0.2)

## 2020-05-26 LAB — URINALYSIS, COMPLETE (UACMP) WITH MICROSCOPIC
Bilirubin Urine: NEGATIVE
Glucose, UA: NEGATIVE mg/dL
Hgb urine dipstick: NEGATIVE
Ketones, ur: NEGATIVE mg/dL
Leukocytes,Ua: NEGATIVE
Nitrite: NEGATIVE
Protein, ur: NEGATIVE mg/dL
Specific Gravity, Urine: 1.003 — ABNORMAL LOW (ref 1.005–1.030)
pH: 5 (ref 5.0–8.0)

## 2020-05-26 LAB — BRAIN NATRIURETIC PEPTIDE: B Natriuretic Peptide: 187.6 pg/mL — ABNORMAL HIGH (ref 0.0–100.0)

## 2020-05-26 LAB — MAGNESIUM: Magnesium: 1.4 mg/dL — ABNORMAL LOW (ref 1.7–2.4)

## 2020-05-26 LAB — TSH: TSH: 1.526 u[IU]/mL (ref 0.350–4.500)

## 2020-05-26 LAB — AMMONIA: Ammonia: 14 umol/L (ref 9–35)

## 2020-05-26 MED ORDER — SODIUM CHLORIDE 0.9 % IV SOLN: INTRAVENOUS | Status: DC

## 2020-05-26 MED ORDER — ONDANSETRON HCL 4 MG/2ML IJ SOLN
4.0000 mg | Freq: Four times a day (QID) | INTRAMUSCULAR | Status: DC | PRN
  Administered 2020-05-28: 4 mg via INTRAVENOUS
  Filled 2020-05-26: qty 2

## 2020-05-26 MED ORDER — SODIUM CHLORIDE 0.9 % IV BOLUS
500.0000 mL | Freq: Once | INTRAVENOUS | Status: AC
  Administered 2020-05-26: 500 mL via INTRAVENOUS

## 2020-05-26 MED ORDER — LACTULOSE 10 GM/15ML PO SOLN
10.0000 g | Freq: Two times a day (BID) | ORAL | Status: DC
  Administered 2020-05-26 – 2020-05-28 (×4): 10 g via ORAL
  Filled 2020-05-26 (×4): qty 30

## 2020-05-26 MED ORDER — SPIRONOLACTONE 25 MG PO TABS
100.0000 mg | ORAL_TABLET | Freq: Every day | ORAL | Status: DC
  Administered 2020-05-27 – 2020-05-31 (×5): 100 mg via ORAL
  Filled 2020-05-26 (×3): qty 4
  Filled 2020-05-26: qty 1
  Filled 2020-05-26 (×3): qty 4

## 2020-05-26 MED ORDER — FUROSEMIDE 20 MG PO TABS
20.0000 mg | ORAL_TABLET | Freq: Every day | ORAL | Status: DC
  Administered 2020-05-26 – 2020-05-31 (×6): 20 mg via ORAL
  Filled 2020-05-26 (×7): qty 1

## 2020-05-26 MED ORDER — MAGNESIUM SULFATE 2 GM/50ML IV SOLN
2.0000 g | Freq: Once | INTRAVENOUS | Status: AC
  Administered 2020-05-26: 2 g via INTRAVENOUS
  Filled 2020-05-26: qty 50

## 2020-05-26 MED ORDER — NADOLOL 20 MG PO TABS
10.0000 mg | ORAL_TABLET | Freq: Every day | ORAL | Status: DC
  Administered 2020-05-27 – 2020-05-31 (×5): 10 mg via ORAL
  Filled 2020-05-26 (×5): qty 1

## 2020-05-26 MED ORDER — ALBUTEROL SULFATE HFA 108 (90 BASE) MCG/ACT IN AERS
1.0000 | INHALATION_SPRAY | Freq: Four times a day (QID) | RESPIRATORY_TRACT | Status: DC | PRN
Start: 2020-05-26 — End: 2020-05-31
  Filled 2020-05-26 (×2): qty 6.7

## 2020-05-26 MED ORDER — ACETAMINOPHEN 500 MG PO TABS
1000.0000 mg | ORAL_TABLET | Freq: Once | ORAL | Status: AC
  Administered 2020-05-26: 1000 mg via ORAL
  Filled 2020-05-26: qty 2

## 2020-05-26 MED ORDER — RIFAXIMIN 550 MG PO TABS
550.0000 mg | ORAL_TABLET | Freq: Two times a day (BID) | ORAL | Status: DC
  Administered 2020-05-26 – 2020-05-31 (×10): 550 mg via ORAL
  Filled 2020-05-26 (×12): qty 1

## 2020-05-26 MED ORDER — ONDANSETRON HCL 4 MG PO TABS: 4.0000 mg | ORAL_TABLET | Freq: Four times a day (QID) | ORAL | Status: DC | PRN

## 2020-05-26 NOTE — Patient Instructions (Signed)
Two Gram Sodium Diet 2000 mg  What is Sodium? Sodium is a mineral found naturally in many foods. The most significant source of sodium in the diet is table salt, which is about 40% sodium.  Processed, convenience, and preserved foods also contain a large amount of sodium.  The body needs only 500 mg of sodium daily to function,  A normal diet provides more than enough sodium even if you do not use salt.  Why Limit Sodium? A build up of sodium in the body can cause thirst, increased blood pressure, shortness of breath, and water retention.  Decreasing sodium in the diet can reduce edema and risk of heart attack or stroke associated with high blood pressure.  Keep in mind that there are many other factors involved in these health problems.  Heredity, obesity, lack of exercise, cigarette smoking, stress and what you eat all play a role.  General Guidelines:  Do not add salt at the table or in cooking.  One teaspoon of salt contains over 2 grams of sodium.  Read food labels  Avoid processed and convenience foods  Ask your dietitian before eating any foods not dicussed in the menu planning guidelines  Consult your physician if you wish to use a salt substitute or a sodium containing medication such as antacids.  Limit milk and milk products to 16 oz (2 cups) per day.  Shopping Hints:  READ LABELS!! "Dietetic" does not necessarily mean low sodium.  Salt and other sodium ingredients are often added to foods during processing.   Menu Planning Guidelines Food Group Choose More Often Avoid  Beverages (see also the milk group All fruit juices, low-sodium, salt-free vegetables juices, low-sodium carbonated beverages Regular vegetable or tomato juices, commercially softened water used for drinking or cooking  Breads and Cereals Enriched white, wheat, rye and pumpernickel bread, hard rolls and dinner rolls; muffins, cornbread and waffles; most dry cereals, cooked cereal without added salt; unsalted  crackers and breadsticks; low sodium or homemade bread crumbs Bread, rolls and crackers with salted tops; quick breads; instant hot cereals; pancakes; commercial bread stuffing; self-rising flower and biscuit mixes; regular bread crumbs or cracker crumbs  Desserts and Sweets Desserts and sweets mad with mild should be within allowance Instant pudding mixes and cake mixes  Fats Butter or margarine; vegetable oils; unsalted salad dressings, regular salad dressings limited to 1 Tbs; light, sour and heavy cream Regular salad dressings containing bacon fat, bacon bits, and salt pork; snack dips made with instant soup mixes or processed cheese; salted nuts  Fruits Most fresh, frozen and canned fruits Fruits processed with salt or sodium-containing ingredient (some dried fruits are processed with sodium sulfites        Vegetables Fresh, frozen vegetables and low- sodium canned vegetables Regular canned vegetables, sauerkraut, pickled vegetables, and others prepared in brine; frozen vegetables in sauces; vegetables seasoned with ham, bacon or salt pork  Condiments, Sauces, Miscellaneous  Salt substitute with physician's approval; pepper, herbs, spices; vinegar, lemon or lime juice; hot pepper sauce; garlic powder, onion powder, low sodium soy sauce (1 Tbs.); low sodium condiments (ketchup, chili sauce, mustard) in limited amounts (1 tsp.) fresh ground horseradish; unsalted tortilla chips, pretzels, potato chips, popcorn, salsa (1/4 cup) Any seasoning made with salt including garlic salt, celery salt, onion salt, and seasoned salt; sea salt, rock salt, kosher salt; meat tenderizers; monosodium glutamate; mustard, regular soy sauce, barbecue, sauce, chili sauce, teriyaki sauce, steak sauce, Worcestershire sauce, and most flavored vinegars; canned gravy and mixes;  regular condiments; salted snack foods, olives, picles, relish, horseradish sauce, catsup   Food preparation: Try these seasonings Meats:    Pork  Sage, onion Serve with applesauce  Chicken Poultry seasoning, thyme, parsley Serve with cranberry sauce  Lamb Curry powder, rosemary, garlic, thyme Serve with mint sauce or jelly  Veal Marjoram, basil Serve with current jelly, cranberry sauce  Beef Pepper, bay leaf Serve with dry mustard, unsalted chive butter  Fish Bay leaf, dill Serve with unsalted lemon butter, unsalted parsley butter  Vegetables:    Asparagus Lemon juice   Broccoli Lemon juice   Carrots Mustard dressing parsley, mint, nutmeg, glazed with unsalted butter and sugar   Green beans Marjoram, lemon juice, nutmeg,dill seed   Tomatoes Basil, marjoram, onion   Spice /blend for [employer]" 4 tsp ground thyme 1 tsp ground sage 3 tsp ground rosemary 4 tsp ground marjoram   Test your knowledge 1. A product that says "Salt Free" may still contain sodium. True or False 2. Garlic Powder and Hot Pepper Sauce an be used as alternative seasonings.True or False 3. Processed foods have more sodium than fresh foods.  True or False 4. Canned Vegetables have less sodium than froze True or False  WAYS TO DECREASE YOUR SODIUM INTAKE 1. Avoid the use of added salt in cooking and at the table.  Table salt (and other prepared seasonings which contain salt) is probably one of the greatest sources of sodium in the diet.  Unsalted foods can gain flavor from the sweet, sour, and butter taste sensations of herbs and spices.  Instead of using salt for seasoning, try the following seasonings with the foods listed.  Remember: how you use them to enhance natural food flavors is limited only by your creativity... Allspice-Meat, fish, eggs, fruit, peas, red and yellow vegetables Almond Extract-Fruit baked goods Anise Seed-Sweet breads, fruit, carrots, beets, cottage cheese, cookies (tastes like licorice) Basil-Meat, fish, eggs, vegetables, rice, vegetables salads, soups, sauces Bay Leaf-Meat, fish, stews, poultry Burnet-Salad, vegetables (cucumber-like  flavor) Caraway Seed-Bread, cookies, cottage cheese, meat, vegetables, cheese, rice Cardamon-Baked goods, fruit, soups Celery Powder or seed-Salads, salad dressings, sauces, meatloaf, soup, bread.Do not use  celery salt Chervil-Meats, salads, fish, eggs, vegetables, cottage cheese (parsley-like flavor) Chili Power-Meatloaf, chicken cheese, corn, eggplant, egg dishes Chives-Salads cottage cheese, egg dishes, soups, vegetables, sauces Cilantro-Salsa, casseroles Cinnamon-Baked goods, fruit, pork, lamb, chicken, carrots Cloves-Fruit, baked goods, fish, pot roast, green beans, beets, carrots Coriander-Pastry, cookies, meat, salads, cheese (lemon-orange flavor) Cumin-Meatloaf, fish,cheese, eggs, cabbage,fruit pie (caraway flavor) [employer], fruit, eggs, fish, poultry, cottage cheese, vegetables Dill Seed-Meat, cottage cheese, poultry, vegetables, fish, salads, bread Fennel Seed-Bread, cookies, apples, pork, eggs, fish, beets, cabbage, cheese, Licorice-like flavor Garlic-(buds or powder) Salads, meat, poultry, fish, bread, butter, vegetables, potatoes.Do not  use garlic salt Ginger-Fruit, vegetables, baked goods, meat, fish, poultry Horseradish Root-Meet, vegetables, butter Lemon Juice or Extract-Vegetables, fruit, tea, baked goods, fish salads Mace-Baked goods fruit, vegetables, fish, poultry (taste like nutmeg) Maple Extract-Syrups Marjoram-Meat, chicken, fish, vegetables, breads, green salads (taste like Sage) Mint-Tea, lamb, sherbet, vegetables, desserts, carrots, cabbage Mustard, Dry or Seed-Cheese, eggs, meats, vegetables, poultry Nutmeg-Baked goods, fruit, chicken, eggs, vegetables, desserts Onion Powder-Meat, fish, poultry, vegetables, cheese, eggs, bread, rice salads (Do not use   Onion salt) Orange Extract-Desserts, baked goods Oregano-Pasta, eggs, cheese, onions, pork, lamb, fish, chicken, vegetables, green salads Paprika-Meat, fish, poultry, eggs, cheese, vegetables Parsley  Flakes-Butter, vegetables, meat fish, poultry, eggs, bread, salads (certain forms may   Contain sodium Pepper-Meat fish, poultry,  vegetables, eggs Peppermint Extract-Desserts, baked goods Poppy Seed-Eggs, bread, cheese, fruit dressings, baked goods, noodles, vegetables, cottage  [employer], poultry, meat, fish, cauliflower, turnips,eggs bread Saffron-Rice, bread, veal, chicken, fish, eggs Sage-Meat, fish, poultry, onions, eggplant, tomateos, pork, stews Savory-Eggs, salads, poultry, meat, rice, vegetables, soups, pork Tarragon-Meat, poultry, fish, eggs, butter, vegetables (licorice-like flavor)  Thyme-Meat, poultry, fish, eggs, vegetables, (clover-like flavor), sauces, soups Tumeric-Salads, butter, eggs, fish, rice, vegetables (saffron-like flavor) Vanilla Extract-Baked goods, candy Vinegar-Salads, vegetables, meat marinades Walnut Extract-baked goods, candy  2. Choose your Foods Wisely   The following is a list of foods to avoid which are high in sodium:  Meats-Avoid all smoked, canned, salt cured, dried and kosher meat and fish as well as Anchovies   [employer] meats:Bologna, Liverwurst, Pastrami Canned meat or fish  Marinated herring Caviar    Pepperoni Corned Beef   Pizza Dried chipped beef  Salami Frozen breaded fish or meat Salt pork Frankfurters or hot dogs  Sardines Gefilte fish   Sausage Ham (boiled ham, Proscuitto Smoked butt    spiced ham)   Spam      TV Dinners Vegetables Canned vegetables (Regular) Relish Canned mushrooms  Sauerkraut Olives    Tomato juice Pickles  Bakery and Dessert Products Canned puddings  Cream pies Cheesecake   Decorated cakes Cookies  Beverages/Juices Tomato juice, regular  Gatorade   V-8 vegetable juice, regular  Breads and Cereals Biscuit mixes   Salted potato chips, corn chips, pretzels Bread stuffing mixes  Salted crackers and rolls Pancake and waffle mixes Self-rising  flour  Seasonings Accent    Meat sauces Barbecue sauce  Meat tenderizer Catsup    Monosodium glutamate (MSG) Celery salt   Onion salt Chili sauce   Prepared mustard Garlic salt   Salt, seasoned salt, sea salt Gravy mixes   Soy sauce Horseradish   Steak sauce Ketchup   Tartar sauce Lite salt    Teriyaki sauce Marinade mixes   Worcestershire sauce  Others Baking powder   Cocoa and cocoa mixes Baking soda   Commercial casserole mixes Candy-caramels, chocolate  Dehydrated soups    Bars, fudge,nougats  Instant rice and pasta mixes Canned broth or soup  Maraschino cherries Cheese, aged and processed cheese and cheese spreads  Learning Assessment Quiz  Indicated T (for True) or F (for False) for each of the following statements:  1. _____ Fresh fruits and vegetables and unprocessed grains are generally low in sodium 2. _____ Water may contain a considerable amount of sodium, depending on the source 3. _____ You can always tell if a food is high in sodium by tasting it 4. _____ Certain laxatives my be high in sodium and should be avoided unless prescribed   by a physician or pharmacist 5. _____ Salt substitutes may be used freely by anyone on a sodium restricted diet 6. _____ Sodium is present in table salt, food additives and as a natural component of   most foods 7. _____ Table salt is approximately 90% sodium 8. _____ Limiting sodium intake may help prevent excess fluid accumulation in the body 9. _____ On a sodium-restricted diet, seasonings such as bouillon soy sauce, and    cooking wine should be used in place of table salt 10. _____ On an ingredient list, a product which lists monosodium glutamate as the first   ingredient is an appropriate food to include on a low sodium diet  Circle the best answer(s) to the following statements (Hint: there  may be more than one correct answer)  11. On a low-sodium diet, some acceptable snack items are:    A. Olives  F. Bean dip   K.  Grapefruit juice    B. Salted Pretzels G. Commercial Popcorn   L. Canned peaches    C. Carrot Sticks  H. Bouillon   M. Unsalted nuts   D. [place] fries  I. Peanut butter crackers N. Salami   E. Sweet pickles J. Tomato Juice   O. Pizza  12.  Seasonings that may be used freely on a reduced - sodium diet include   A. Lemon wedges F.Monosodium glutamate K. Celery seed    B.Soysauce   G. Pepper   L. Mustard powder   C. Sea salt  H. Cooking wine  M. Onion flakes   D. Vinegar  E. Prepared horseradish N. Salsa   E. Sage   J. Worcestershire sauce  O. Chutney

## 2020-05-26 NOTE — ED Triage Notes (Addendum)
Pt comes into the ED via EMS from home , increased confusion since last night, was recently here with low Na+. Denies any pain Pt was admitted here 1/12-1/17 with initial Na+107 136/60 HR60 98.2 CBG148 98%RA

## 2020-05-26 NOTE — ED Notes (Signed)
Pt states that her purewick was removed and she needs to urinate. RN assessed and pt still has purewick in place. Pt informed of this. Pt started to urinate and it went into suction canister. Pt stated that she has urine going down her leg. RN assessed and noted no urine running down her leg. Pt informed.

## 2020-05-26 NOTE — H&P (Signed)
History and Physical    ASHWIKA [name] OTL:[id number] DOB: 17-Oct-1940 DOA: 05/26/2020  PCP: Mar Daring, PA-C  Patient coming from: Home via EMS  I have personally briefly reviewed patient's old medical records in [place]  Chief Complaint: Confusion  HPI: Jennifer [name] is a 80 y.o. female with medical history significant for nonalcoholic liver cirrhosis, esophageal varices s/p banding, chronic hyponatremia, thrombocytopenia, diet-controlled type 2 diabetes, hypertension, and asthma who presents to the ED for evaluation of altered mental status.  Patient was recently admitted 05/19/2020-05/24/2020 for acute on chronic hyponatremia.  Initial sodium was 107 compared to baseline around 127.  Nephrology were consulted and she was managed with 3% saline and tolvaptan.  On discharge, sodium was 131.  Patient was also found to have Proteus mirabilis UTI for which she completed 5 days of cephalosporin antibiotic therapy.  Patient returns with complaint of shakes of her upper extremities and feeling cold.  She has noticed increased swelling of her abdomen and bilateral lower extremities.  Reports good urine output without dysuria.  She says her last bowel movement was earlier today.  She is awake but keeps her eyes mostly closed.  She is oriented to self, place, and year but history is otherwise limited as she keeps repeating "God help me."  ED Course:  Initial vitals showed BP 105/46, pulse 58, RR 16, temp 97.8 F, SPO2 97% on room air.  Labs are notable for sodium 127, potassium 3.8, chloride 89, bicarb 28, BUN 10, creatinine 0.77, magnesium 1.4, serum glucose 142, AST 44, ALT 17, alk phos 80, total bilirubin 2.0, WBC 6.2, hemoglobin 11.9, platelets 92,000, ammonia 14, BNP 187.6, TSH 1.526.  SARS-CoV-2 PCR pending was ordered and pending.  CT head without contrast is negative for evidence of acute intracranial abnormality.  2 view chest x-ray shows low lung volumes without focal  consolidation, effusion, or edema.  RUQ ultrasound shows cholelithiasis and gallbladder sludge without evidence of acute cholecystitis.  Cirrhotic liver with associated ascites noted.  Patient was given 500 cc normal saline, IV magnesium 2 g.  The hospitalist service was consulted to admit for further evaluation and management.  Review of Systems: All systems reviewed and are negative except as documented in history of present illness above.   Past Medical History:  Diagnosis Date  . Allergy   . Arthritis   . Asthma   . Colon polyp   . Fatty liver   . GERD (gastroesophageal reflux disease)   . Hyperlipidemia   . Hypertension   . Motion sickness    boats    Past Surgical History:  Procedure Laterality Date  . ABDOMINAL HYSTERECTOMY  1978  . COLONOSCOPY WITH PROPOFOL N/A 02/01/2015   Procedure: COLONOSCOPY WITH PROPOFOL;  Surgeon: Lucilla Lame, MD;  Location: [place];  Service: Endoscopy;  Laterality: N/A;  Diabetic - oral meds  . COLONOSCOPY WITH PROPOFOL N/A 11/22/2017   Procedure: COLONOSCOPY WITH PROPOFOL;  Surgeon: Lin Landsman, MD;  Location: [facility] ENDOSCOPY;  Service: Gastroenterology;  Laterality: N/A;  . ESOPHAGOGASTRODUODENOSCOPY (EGD) WITH PROPOFOL N/A 07/12/2017   Procedure: ESOPHAGOGASTRODUODENOSCOPY (EGD) WITH PROPOFOL;  Surgeon: Lin Landsman, MD;  Location: [facility] ENDOSCOPY;  Service: Gastroenterology;  Laterality: N/A;  . ESOPHAGOGASTRODUODENOSCOPY (EGD) WITH PROPOFOL N/A 08/16/2017   Procedure: ESOPHAGOGASTRODUODENOSCOPY (EGD) WITH PROPOFOL;  Surgeon: Lin Landsman, MD;  Location: [facility] ENDOSCOPY;  Service: Gastroenterology;  Laterality: N/A;  . ESOPHAGOGASTRODUODENOSCOPY (EGD) WITH PROPOFOL N/A 09/20/2017   Procedure: ESOPHAGOGASTRODUODENOSCOPY (EGD) WITH PROPOFOL;  Surgeon:  Lin Landsman, MD;  Location: [facility] ENDOSCOPY;  Service: Gastroenterology;  Laterality: N/A;  . ESOPHAGOGASTRODUODENOSCOPY (EGD) WITH PROPOFOL N/A 11/01/2017    Procedure: ESOPHAGOGASTRODUODENOSCOPY (EGD) WITH PROPOFOL;  Surgeon: Lin Landsman, MD;  Location: [facility] ENDOSCOPY;  Service: Gastroenterology;  Laterality: N/A;  . ESOPHAGOGASTRODUODENOSCOPY (EGD) WITH PROPOFOL N/A 01/17/2018   Procedure: ESOPHAGOGASTRODUODENOSCOPY (EGD) WITH PROPOFOL;  Surgeon: Lin Landsman, MD;  Location: [facility] ENDOSCOPY;  Service: Gastroenterology;  Laterality: N/A;  . ESOPHAGOGASTRODUODENOSCOPY (EGD) WITH PROPOFOL N/A 06/20/2018   Procedure: ESOPHAGOGASTRODUODENOSCOPY (EGD) WITH PROPOFOL;  Surgeon: Lin Landsman, MD;  Location: [facility] ENDOSCOPY;  Service: Gastroenterology;  Laterality: N/A;  EGD/ with banding ligation   . IR RADIOLOGIST EVAL & MGMT  07/09/2018  . POLYPECTOMY  02/01/2015   Procedure: POLYPECTOMY;  Surgeon: Lucilla Lame, MD;  Location: [place];  Service: Endoscopy;;  . TONSILLECTOMY AND ADENOIDECTOMY      Social History:  reports that she quit smoking about 33 years ago. Her smoking use included cigarettes. She has a 3.00 pack-year smoking history. She has never used smokeless tobacco. She reports that she does not drink alcohol and does not use drugs.  Allergies  Allergen Reactions  . Amoxicillin Other (See Comments)    Yeast infections  . Codeine Other (See Comments)    Pt denies  . Hydrocodone-Acetaminophen Other (See Comments)    Pt denies  . Nitrofurantoin Other (See Comments)  . Nitrofurantoin Monohyd Macro     "Sugar got high"  . Penicillins     Has patient had a PCN reaction causing immediate rash, facial/tongue/throat swelling, SOB or lightheadedness with hypotension: Unknown Has patient had a PCN reaction causing severe rash involving mucus membranes or skin necrosis: Unknown Has patient had a PCN reaction that required hospitalization: Unknown Has patient had a PCN reaction occurring within the last 10 years: Unknown If all of the above answers are "NO", then may proceed with Cephalosporin use.  . Sulfa Antibiotics  Other (See Comments)    Family History  Problem Relation Age of Onset  . Dementia Mother   . Alcohol abuse Father   . Cancer Sister        lung  . Lymphoma Sister   . Melanoma Sister   . Lymphoma Sister      Prior to Admission medications   Medication Sig Start Date End Date Taking? Authorizing Provider  albuterol (VENTOLIN HFA) 108 (90 Base) MCG/ACT inhaler Inhale 1-2 puffs into the lungs every 6 (six) hours as needed for wheezing or shortness of breath. 12/05/19   Mar Daring, PA-C  Blood Glucose Monitoring Suppl (ACCU-CHEK AVIVA PLUS) w/Device KIT To check blood sugar daily 03/26/20   [name] M, PA-C  FLUZONE HIGH-DOSE QUADRIVALENT 0.7 ML SUSY  01/22/19   [provider]  furosemide (LASIX) 20 MG tablet TAKE ONE TABLET EVERY DAY Patient taking differently: Take 20 mg by mouth daily. 02/16/20   Mar Daring, PA-C  glucose blood (ACCU-CHEK AVIVA PLUS) test strip To check blood sugar daily 01/28/20   Mar Daring, PA-C  Lancets [facility]) lancets To check blood sugar daily 12/12/18   Mar Daring, PA-C  Magnesium 200 MG TABS Take 1 tablet by mouth daily.    [provider]  nadolol (CORGARD) 20 MG tablet TAKE ONE-HALF TABLET BY MOUTH EVERY DAY Patient taking differently: Take 10 mg by mouth 2 (two) times daily. 10/07/19   Mar Daring, PA-C  pantoprazole (PROTONIX) 40 MG tablet TAKE ONE TABLET  BY MOUTH TWICE DAILY Patient taking differently: Take 40 mg by mouth 2 (two) times daily. 02/26/20   Mar Daring, PA-C  spironolactone (ALDACTONE) 100 MG tablet TAKE ONE TABLET BY MOUTH EVERY DAY Patient taking differently: Take 100 mg by mouth daily. 03/04/20   Mar Daring, PA-C    Physical Exam: Vitals:   05/26/20 1307 05/26/20 1318 05/26/20 1751  BP:  (!) 105/46 122/64  Pulse:  (!) 58 66  Resp:  16 20  Temp:  97.8 F (36.6 C)   TempSrc:  Oral   SpO2:  97%   Weight: 84.1 kg    Height: 5' 1"   (1.549 m)     Constitutional: Elderly woman resting in bed with head elevated, appears anxious Eyes: PERRL, lids and conjunctivae normal ENMT: Mucous membranes are moist. Posterior pharynx clear of any exudate or lesions.Normal dentition.  Neck: normal, supple, no masses. Respiratory: clear to auscultation anteriorly.  Normal respiratory effort. No accessory muscle use.  Cardiovascular: Regular rate and rhythm, no murmurs / rubs / gallops.  +1 bilateral lower extremity edema. Abdomen: Distended and slightly tense abdomen with ascites. Bowel sounds positive.  Musculoskeletal: no clubbing / cyanosis. No joint deformity upper and lower extremities.  Tremors bilateral lower extremities, worse with movement.  No contractures. Normal muscle tone.  Skin: no rashes, lesions, ulcers. No induration Neurologic: CN 2-12 grossly intact. Sensation intact, Strength 5/5 in all 4.  Psychiatric: Alert and oriented x 3 but with repetitive speech and anxious mood.  Labs on Admission: I have personally reviewed following labs and imaging studies  CBC: Recent Labs  Lab 05/19/20 2336 05/26/20 1326  WBC 6.4 6.2  HGB 10.8* 11.9*  HCT 28.9* 34.6*  MCV 83.3 91.1  PLT 57* 92*   Basic Metabolic Panel: Recent Labs  Lab 05/22/20 0309 05/22/20 1130 05/22/20 1935 05/23/20 0210 05/23/20 0952 05/24/20 0905 05/26/20 1326  NA 123*  123*   < > 131*  130* 130* 131* 131* 127*  K 3.8  --  3.6  --  3.9 3.9 3.8  CL 90*  --  95*  --  96* 97* 89*  CO2 25  --  26  --  28 27 28   GLUCOSE 111*  --  171*  --  144* 115* 142*  BUN 7*  --  7*  --  <5* 5* 10  CREATININE 0.63  --  0.73  --  0.72 0.76 0.77  CALCIUM 8.4*  --  8.5*  --  9.0 8.8* 9.0  MG  --   --   --   --   --   --  1.4*   < > = values in this interval not displayed.   GFR: Estimated Creatinine Clearance: 55.2 mL/min (by C-G formula based on SCr of 0.77 mg/dL). Liver Function Tests: Recent Labs  Lab 05/22/20 1935 05/26/20 1326  AST 38 44*  ALT 15 17   ALKPHOS 62 80  BILITOT 1.1 2.0*  PROT 5.4* 6.2*  ALBUMIN 2.7* 3.1*   No results for input(s): LIPASE, AMYLASE in the last 168 hours. Recent Labs  Lab 05/26/20 1326  AMMONIA 14   Coagulation Profile: No results for input(s): INR, PROTIME in the last 168 hours. Cardiac Enzymes: No results for input(s): CKTOTAL, CKMB, CKMBINDEX, TROPONINI in the last 168 hours. BNP (last 3 results) No results for input(s): PROBNP in the last 8760 hours. HbA1C: No results for input(s): HGBA1C in the last 72 hours. CBG: Recent Labs  Lab 05/23/20 [phone]  05/23/20 1220 05/23/20 1808 05/23/20 2128 05/24/20 0723  GLUCAP 112* 142* 128* 115* 111*   Lipid Profile: No results for input(s): CHOL, HDL, LDLCALC, TRIG, CHOLHDL, LDLDIRECT in the last 72 hours. Thyroid Function Tests: Recent Labs    05/26/20 1326  TSH 1.526   Anemia Panel: No results for input(s): VITAMINB12, FOLATE, FERRITIN, TIBC, IRON, RETICCTPCT in the last 72 hours. Urine analysis:    Component Value Date/Time   COLORURINE STRAW (A) 05/26/2020 1326   APPEARANCEUR CLEAR (A) 05/26/2020 1326   LABSPEC 1.003 (L) 05/26/2020 1326   PHURINE 5.0 05/26/2020 1326   GLUCOSEU NEGATIVE 05/26/2020 1326   [place] 05/26/2020 1326   [place] 05/26/2020 1326   BILIRUBINUR negative 01/16/2017 0922   KETONESUR NEGATIVE 05/26/2020 1326   PROTEINUR NEGATIVE 05/26/2020 1326   UROBILINOGEN 0.2 01/16/2017 0922   NITRITE NEGATIVE 05/26/2020 1326   LEUKOCYTESUR NEGATIVE 05/26/2020 1326    Radiological Exams on Admission: DG Chest 2 View  Result Date: 05/26/2020 CLINICAL DATA:  Low blood pressure EXAM: CHEST - 2 VIEW COMPARISON:  01/30/2018 FINDINGS: Low lung volumes. No focal opacity or pleural effusion. Stable cardiomediastinal silhouette. Stable nodularity projecting over right first rib end. No pneumothorax. IMPRESSION: No active cardiopulmonary disease. Electronically Signed   By: Donavan Foil M.D.   On: 05/26/2020 17:23    CT Head Wo Contrast  Result Date: 05/26/2020 CLINICAL DATA:  Mental status change.  Confusion. EXAM: CT HEAD WITHOUT CONTRAST TECHNIQUE: Contiguous axial images were obtained from the base of the skull through the vertex without intravenous contrast. COMPARISON:  CT head February 18, 2010. FINDINGS: Brain: No evidence of acute large vascular territory infarction, hemorrhage, hydrocephalus, extra-axial collection or mass lesion/mass effect. Vascular: No hyperdense vessel identified. Calcific atherosclerosis. Skull: No acute fracture. Sinuses/Orbits: Visualized sinuses are clear. Unremarkable visualized orbits. Other: No mastoid effusions. IMPRESSION: No evidence of acute intracranial abnormality. Electronically Signed   By: Margaretha Sheffield MD   On: 05/26/2020 17:01   US ABDOMEN LIMITED RUQ (LIVER/GB)  Result Date: 05/26/2020 CLINICAL DATA:  Bilirubinemia. EXAM: ULTRASOUND ABDOMEN LIMITED RIGHT UPPER QUADRANT COMPARISON:  None. FINDINGS: Gallbladder: Shadowing echogenic gallstones are seen within the gallbladder lumen. The largest measures approximately 1.1 cm. Heterogeneous gallbladder sludge is also noted. The gallbladder wall measures 12.5 mm in thickness. No sonographic Murphy sign noted by sonographer. Common bile duct: Diameter: 4.1 mm Liver: The liver is nodular in contour and demonstrates coarse echotexture. No focal lesion identified. Within normal limits in parenchymal echogenicity. Portal vein is patent on color Doppler imaging with normal direction of blood flow towards the liver. Other: Free fluid is seen within the abdomen. IMPRESSION: 1. Cholelithiasis and gallbladder sludge, without evidence of acute cholecystitis. 2. Cirrhotic liver with associated ascites. Electronically Signed   By: Virgina Norfolk M.D.   On: 05/26/2020 18:13    EKG: Personally reviewed. Sinus rhythm, low voltage.  Tachycardia resolved when compared to prior.  Assessment/Plan Principal Problem:   Altered mental  status Active Problems:   Type 2 diabetes mellitus with unspecified complications (HCC)   Calculus of gallbladder   Thrombocytopenia (HCC)   Liver cirrhosis secondary to NASH ([place])   Hyponatremia  Jennifer [name] is a 80 y.o. female with medical history significant for nonalcoholic liver cirrhosis, esophageal varices s/p banding, chronic hyponatremia, thrombocytopenia, diet-controlled type 2 diabetes, hypertension, and asthma who is admitted for altered mental status.  Altered mental status Nonalcoholic liver cirrhosis with ascites Esophageal varices s/p banding: Patient presenting with reported confusion although she  is alert and oriented x3.  She does have repetitive speech and tremors of her upper extremities.  She has significant abdominal ascites and lower extremity edema.  Ammonia is normal however suspect hepatic encephalopathy is primary issue. -Start lactulose 10 g BID, titrate to 2-3 BMs per day -Start rifaximin 550 mg twice daily -Continue home Lasix, spironolactone, nadolol -US paracentesis ordered  Hyponatremia: Chronic secondary to liver cirrhosis with baseline sodium 127.  Continue to monitor.  Hypomagnesemia: Supplemented, recheck in AM.  Thrombocytopenia: Chronic secondary to liver cirrhosis without obvious bleeding.  Continue to monitor.  Type 2 diabetes: Diet controlled, continue monitor.  Cholelithiasis: Chronic and follow-up with general surgery as an outpatient.  RUQ ultrasound without evidence of cholecystitis.  DVT prophylaxis: SCDs Code Status: Full code, confirmed with patient Family Communication: Discussed with patient Disposition Plan: From home and likely discharge to home pending improvement encephalopathy Consults called: None Admission status:  Status is: Observation  The patient remains OBS appropriate and will d/c before 2 midnights.  Dispo: The patient is from: Home              Anticipated d/c is to: Home              Anticipated d/c  date is: 1 day              Patient currently is not medically stable to d/c.   Zada Finders MD [facility]  If 7PM-7AM, please contact night-coverage [email or web address]  05/26/2020, 6:52 PM

## 2020-05-26 NOTE — ED Notes (Signed)
Pt comingin into the ER. Pt on cardiac, bp and pulse ox monitor. Pt denies pain. Family at bedside state bringing pt in for weakness and swelling. Pt noted to have swelling to bilateral lower extremities.

## 2020-05-26 NOTE — ED Provider Notes (Addendum)
[facility] Emergency Department Provider Note  ____________________________________________   Event Date/Time   First MD Initiated Contact with Patient 05/26/20 1623     (approximate)  I have reviewed the triage vital signs and the nursing notes.   HISTORY  Chief Complaint Altered Mental Status   HPI Jennifer [name] is a 80 y.o. female with medical history significant ofDM and cirrhosiswith chronic hyponatremiasecondary to NASH and recent hospitalization 1/12-1/17 for symptomatic hyponatremia and UTI who presents accompanied by daughter for assessment of some worsening confusion today.  Patient states she feels she is having difficulty with her words and has had a headache and feels she is little more short of breath than usual today.  She also thinks that she is having some burning with urination but is not sure how long this has been going on.  She denies any recent falls or injuries, chest pain, cough, fevers, Donnell pain, vomiting, diarrhea rash or other acute sick symptoms although she does states she is having difficulty remembering things and getting her words out.  No EtOH use or illicit drug use.  He states he has been taking her medicines as directed.  Her daughter states that she saw her this morning very confused throwing Vaseline over all over herself and deleting things from her phone but unable to explain the some questioned.    Past Medical History:  Diagnosis Date   Allergy    Arthritis    Asthma    Colon polyp    Fatty liver    GERD (gastroesophageal reflux disease)    Hyperlipidemia    Hypertension    Motion sickness    boats    Patient Active Problem List   Diagnosis Date Noted   Altered mental status 05/26/2020   Hyponatremia 05/19/2020   Chronic hyponatremia 05/19/2020   Essential hypertension, benign 02/04/2018   Iron deficiency 02/04/2018   Pelvic fracture ([place]) 01/31/2018   Multiple gastric polyps     Hx of colonic polyps    Acute upper GI bleed 07/12/2017   Liver cirrhosis secondary to NASH ([place])    Carpal tunnel syndrome on right 09/28/2016   Obesity (BMI 30.0-34.9) 09/20/2016   Upper respiratory infection 03/17/2016   Thrombocytopenia ([place]) 12/29/2015   Benign neoplasm of ascending colon    Benign neoplasm of transverse colon    Benign neoplasm of sigmoid colon    Allergic rhinitis 10/14/2014   Airway hyperreactivity 10/14/2014   Type 2 diabetes mellitus with unspecified complications ([place]) 07/37/1062   Calculus of gallbladder 10/14/2014   Esophagitis, reflux 10/14/2014   Vitamin D deficiency 10/14/2014    Past Surgical History:  Procedure Laterality Date   ABDOMINAL HYSTERECTOMY  1978   COLONOSCOPY WITH PROPOFOL N/A 02/01/2015   Procedure: COLONOSCOPY WITH PROPOFOL;  Surgeon: Lucilla Lame, MD;  Location: [place];  Service: Endoscopy;  Laterality: N/A;  Diabetic - oral meds   COLONOSCOPY WITH PROPOFOL N/A 11/22/2017   Procedure: COLONOSCOPY WITH PROPOFOL;  Surgeon: Lin Landsman, MD;  [facility] ENDOSCOPY;  Service: Gastroenterology;  Laterality: N/A;   ESOPHAGOGASTRODUODENOSCOPY (EGD) WITH PROPOFOL N/A 07/12/2017   Procedure: ESOPHAGOGASTRODUODENOSCOPY (EGD) WITH PROPOFOL;  Surgeon: Lin Landsman, MD;  Location: [facility] ENDOSCOPY;  Service: Gastroenterology;  Laterality: N/A;   ESOPHAGOGASTRODUODENOSCOPY (EGD) WITH PROPOFOL N/A 08/16/2017   Procedure: ESOPHAGOGASTRODUODENOSCOPY (EGD) WITH PROPOFOL;  Surgeon: Lin Landsman, MD;  Location: [facility] ENDOSCOPY;  Service: Gastroenterology;  Laterality: N/A;   ESOPHAGOGASTRODUODENOSCOPY (EGD) WITH PROPOFOL N/A 09/20/2017   Procedure:  ESOPHAGOGASTRODUODENOSCOPY (EGD) WITH PROPOFOL;  Surgeon: Lin Landsman, MD;  Location: [facility] ENDOSCOPY;  Service: Gastroenterology;  Laterality: N/A;   ESOPHAGOGASTRODUODENOSCOPY (EGD) WITH PROPOFOL N/A 11/01/2017   Procedure: ESOPHAGOGASTRODUODENOSCOPY  (EGD) WITH PROPOFOL;  Surgeon: Lin Landsman, MD;  Location: [facility] ENDOSCOPY;  Service: Gastroenterology;  Laterality: N/A;   ESOPHAGOGASTRODUODENOSCOPY (EGD) WITH PROPOFOL N/A 01/17/2018   Procedure: ESOPHAGOGASTRODUODENOSCOPY (EGD) WITH PROPOFOL;  Surgeon: Lin Landsman, MD;  Location: [facility] ENDOSCOPY;  Service: Gastroenterology;  Laterality: N/A;   ESOPHAGOGASTRODUODENOSCOPY (EGD) WITH PROPOFOL N/A 06/20/2018   Procedure: ESOPHAGOGASTRODUODENOSCOPY (EGD) WITH PROPOFOL;  Surgeon: Lin Landsman, MD;  Location: [place];  Service: Gastroenterology;  Laterality: N/A;  EGD/ with banding ligation    IR RADIOLOGIST EVAL & MGMT  07/09/2018   POLYPECTOMY  02/01/2015   Procedure: POLYPECTOMY;  Surgeon: Lucilla Lame, MD;  Location: [place];  Service: Endoscopy;;   TONSILLECTOMY AND ADENOIDECTOMY      Prior to Admission medications   Medication Sig Start Date End Date Taking? Authorizing Provider  albuterol (VENTOLIN HFA) 108 (90 Base) MCG/ACT inhaler Inhale 1-2 puffs into the lungs every 6 (six) hours as needed for wheezing or shortness of breath. 12/05/19   Mar Daring, PA-C  Blood Glucose Monitoring Suppl (ACCU-CHEK AVIVA PLUS) w/Device KIT To check blood sugar daily 03/26/20   [name] M, PA-C  FLUZONE HIGH-DOSE QUADRIVALENT 0.7 ML SUSY  01/22/19   [provider]  furosemide (LASIX) 20 MG tablet TAKE ONE TABLET EVERY DAY Patient taking differently: Take 20 mg by mouth daily. 02/16/20   Mar Daring, PA-C  glucose blood (ACCU-CHEK AVIVA PLUS) test strip To check blood sugar daily 01/28/20   Mar Daring, PA-C  Lancets [facility]) lancets To check blood sugar daily 12/12/18   Mar Daring, PA-C  Magnesium 200 MG TABS Take 1 tablet by mouth daily.    [provider]  nadolol (CORGARD) 20 MG tablet TAKE ONE-HALF TABLET BY MOUTH EVERY DAY Patient taking differently: Take 10 mg by mouth 2 (two) times  daily. 10/07/19   Mar Daring, PA-C  pantoprazole (PROTONIX) 40 MG tablet TAKE ONE TABLET BY MOUTH TWICE DAILY Patient taking differently: Take 40 mg by mouth 2 (two) times daily. 02/26/20   Mar Daring, PA-C  spironolactone (ALDACTONE) 100 MG tablet TAKE ONE TABLET BY MOUTH EVERY DAY Patient taking differently: Take 100 mg by mouth daily. 03/04/20   Mar Daring, PA-C    Allergies Amoxicillin, Codeine, Hydrocodone-acetaminophen, Nitrofurantoin, Nitrofurantoin monohyd macro, Penicillins, and Sulfa antibiotics  Family History  Problem Relation Age of Onset   Dementia Mother    Alcohol abuse Father    Cancer Sister        lung   Lymphoma Sister    Melanoma Sister    Lymphoma Sister     Social History Social History   Tobacco Use   Smoking status: Former Smoker    Packs/day: 0.50    Years: 6.00    Pack years: 3.00    Types: Cigarettes    Quit date: 05/08/1987    Years since quitting: 33.0   Smokeless tobacco: Never Used  Vaping Use   Vaping Use: Never used  Substance Use Topics   Alcohol use: Never   Drug use: Never    Review of Systems  Review of Systems  Unable to perform ROS: Mental status change  Eyes: Negative for blurred vision and double vision.  Respiratory: Positive for shortness of breath.  Cardiovascular: Negative for chest pain.  Genitourinary: Positive for dysuria.  Musculoskeletal: Negative for myalgias.  Neurological: Positive for headaches.  Psychiatric/Behavioral: Negative for depression.    Further history as far as her ROS is limited by patient stating she cannot recall and cannot get her words out properly.  ____________________________________________   PHYSICAL EXAM:  VITAL SIGNS: ED Triage Vitals  Enc Vitals Group     BP 05/26/20 1318 (!) 105/46     Pulse Rate 05/26/20 1318 (!) 58     Resp 05/26/20 1318 16     Temp 05/26/20 1318 97.8 F (36.6 C)     Temp Source 05/26/20 1318 Oral     SpO2  05/26/20 1318 97 %     Weight 05/26/20 1307 185 lb 4.8 oz (84.1 kg)     Height 05/26/20 1307 5' 1"  (1.549 m)     Head Circumference --      Peak Flow --      Pain Score 05/26/20 1307 0     Pain Loc --      Pain Edu? --      Excl. in [place]? --    Vitals:   05/26/20 1318 05/26/20 1751  BP: (!) 105/46 122/64  Pulse: (!) 58 66  Resp: 16 20  Temp: 97.8 F (36.6 C)   SpO2: 97%    Physical Exam Vitals and nursing note reviewed.  Constitutional:      General: She is not in acute distress.    Appearance: She is well-developed and well-nourished.  HENT:     Head: Normocephalic and atraumatic.     Right Ear: External ear normal.     Left Ear: External ear normal.     Nose: Nose normal.  Eyes:     Conjunctiva/sclera: Conjunctivae normal.  Cardiovascular:     Rate and Rhythm: Normal rate and regular rhythm.     Heart sounds: No murmur heard.   Pulmonary:     Effort: Pulmonary effort is normal. No respiratory distress.     Breath sounds: Normal breath sounds.  Abdominal:     Palpations: Abdomen is soft.     Tenderness: There is no abdominal tenderness.  Musculoskeletal:        General: No edema.     Cervical back: Neck supple.  Skin:    General: Skin is warm and dry.     Capillary Refill: Capillary refill takes less than 2 seconds.  Neurological:     Mental Status: She is alert.     Cranial Nerves: No cranial nerve deficit.  Psychiatric:        Mood and Affect: Mood and affect and mood normal.      ____________________________________________   LABS (all labs ordered are listed, but only abnormal results are displayed)  Labs Reviewed  COMPREHENSIVE METABOLIC PANEL - Abnormal; Notable for the following components:      Result Value   Sodium 127 (*)    Chloride 89 (*)    Glucose, Bld 142 (*)    Total Protein 6.2 (*)    Albumin 3.1 (*)    AST 44 (*)    Total Bilirubin 2.0 (*)    All other components within normal limits  CBC - Abnormal; Notable for the following  components:   RBC 3.80 (*)    Hemoglobin 11.9 (*)    HCT 34.6 (*)    RDW 15.6 (*)    Platelets 92 (*)    All other components within normal limits  URINALYSIS, COMPLETE (UACMP) WITH MICROSCOPIC - Abnormal; Notable for the following components:   Color, Urine STRAW (*)    APPearance CLEAR (*)    Specific Gravity, Urine 1.003 (*)    Bacteria, UA RARE (*)    All other components within normal limits  BRAIN NATRIURETIC PEPTIDE - Abnormal; Notable for the following components:   B Natriuretic Peptide 187.6 (*)    All other components within normal limits  MAGNESIUM - Abnormal; Notable for the following components:   Magnesium 1.4 (*)    All other components within normal limits  RESP PANEL BY RT-PCR (FLU A&B, COVID) ARPGX2  AMMONIA  TSH  CBG MONITORING, ED   ____________________________________________  EKG  Sinus rhythm with ventricular rate of 60, normal axis, low voltage throughout without clear evidence of acute ischemia. ____________________________________________  RADIOLOGY  ED MD interpretation: Chest x-ray shows cardiomegaly without any focal consolidations, large effusion, thorax, significant edema or other clear acute events or process.  CT head is unremarkable for evidence of acute intracranial process.  Right upper quadrant ultrasound shows gallstones and sludge without any evidence of cholecystitis.  Official radiology report(s): DG Chest 2 View  Result Date: 05/26/2020 CLINICAL DATA:  Low blood pressure EXAM: CHEST - 2 VIEW COMPARISON:  01/30/2018 FINDINGS: Low lung volumes. No focal opacity or pleural effusion. Stable cardiomediastinal silhouette. Stable nodularity projecting over right first rib end. No pneumothorax. IMPRESSION: No active cardiopulmonary disease. Electronically Signed   By: Donavan Foil M.D.   On: 05/26/2020 17:23   CT Head Wo Contrast  Result Date: 05/26/2020 CLINICAL DATA:  Mental status change.  Confusion. EXAM: CT HEAD WITHOUT CONTRAST  TECHNIQUE: Contiguous axial images were obtained from the base of the skull through the vertex without intravenous contrast. COMPARISON:  CT head February 18, 2010. FINDINGS: Brain: No evidence of acute large vascular territory infarction, hemorrhage, hydrocephalus, extra-axial collection or mass lesion/mass effect. Vascular: No hyperdense vessel identified. Calcific atherosclerosis. Skull: No acute fracture. Sinuses/Orbits: Visualized sinuses are clear. Unremarkable visualized orbits. Other: No mastoid effusions. IMPRESSION: No evidence of acute intracranial abnormality. Electronically Signed   By: Margaretha Sheffield MD   On: 05/26/2020 17:01   US ABDOMEN LIMITED RUQ (LIVER/GB)  Result Date: 05/26/2020 CLINICAL DATA:  Bilirubinemia. EXAM: ULTRASOUND ABDOMEN LIMITED RIGHT UPPER QUADRANT COMPARISON:  None. FINDINGS: Gallbladder: Shadowing echogenic gallstones are seen within the gallbladder lumen. The largest measures approximately 1.1 cm. Heterogeneous gallbladder sludge is also noted. The gallbladder wall measures 12.5 mm in thickness. No sonographic Murphy sign noted by sonographer. Common bile duct: Diameter: 4.1 mm Liver: The liver is nodular in contour and demonstrates coarse echotexture. No focal lesion identified. Within normal limits in parenchymal echogenicity. Portal vein is patent on color Doppler imaging with normal direction of blood flow towards the liver. Other: Free fluid is seen within the abdomen. IMPRESSION: 1. Cholelithiasis and gallbladder sludge, without evidence of acute cholecystitis. 2. Cirrhotic liver with associated ascites. Electronically Signed   By: Virgina Norfolk M.D.   On: 05/26/2020 18:13    ____________________________________________   PROCEDURES  Procedure(s) performed (including Critical Care):  .1-3 Lead EKG Interpretation Performed by: Lucrezia Starch, MD Authorized by: Lucrezia Starch, MD     Interpretation: normal     ECG rate assessment: normal      Rhythm: sinus rhythm     Ectopy: none     Conduction: normal       ____________________________________________   INITIAL IMPRESSION / ASSESSMENT AND PLAN / ED COURSE  Patient presents with post history exam for assessment of headache, nausea, shortness of breath and confusion as well as difficulty getting her words out per patient and daughter.  On arrival patient is afebrile and hemodynamically stable.  She has a nonfocal neuro exam but does appear to be struggling to express herself.  Differential includes but is not limited to metabolic derangements, liver failure, acute infectious process, and thyroid derangements.  No history or exam findings to suggest acute traumatic injury and a very low suspicion for toxic ingestion.  No exam findings or CT head findings to suggest CVA or other clear acute intracranial process.  Patient has no fever or elevated white blood cell count or other findings that would suggest encephalitis.  CMP shows evidence of hyponatremia with NA of 127 as well as low chloride at 89 with no other significant derangements although bili is 2.  LFTs are unremarkable.  Per review of discharge summary 127 is close to patient's baseline sodium.  Ultrasound obtained shows evidence of stones and sludge but no evidence of cholecystitis.  Do not believe this is etiology for patient's confusion and difficulty with words today.  CBC shows no leukocytosis and hemoglobin at baseline at 11.9 compared to 10.87 days ago.  Chest x-ray is no evidence of confusion or volume overload.  UA does not appear infected.  Ammonia is not elevated and overall patient is not jaundiced or icteric and low suspicion for hepatic failure contributing to symptoms.  TSH is WNL and not consistent with acute thyroid derangements.  Magnesium is low and this was repleted.  Patient was also given normal saline bolus and had slight improvement in her headache on reassessment but was still mumbling about stones  in her hand when none were present.  I will plan to admit to medicine service for encephalopathy of unclear etiology.      ____________________________________________   FINAL CLINICAL IMPRESSION(S) / ED DIAGNOSES  Final diagnoses:  Bilirubinemia  Altered mental status, unspecified altered mental status type  Hypomagnesemia  Hyponatremia  Hypochloremia    Medications  0.9 %  sodium chloride infusion (has no administration in time range)  acetaminophen (TYLENOL) tablet 1,000 mg (has no administration in time range)  magnesium sulfate IVPB 2 g 50 mL (2 g Intravenous New Bag/Given 05/26/20 1751)  sodium chloride 0.9 % bolus 500 mL (500 mLs Intravenous New Bag/Given 05/26/20 1749)     ED Discharge Orders    None       Note:  This document was prepared using Dragon voice recognition software and may include unintentional dictation errors.   Lucrezia Starch, MD 05/26/20 Yevette Edwards    Lucrezia Starch, MD 05/26/20 [phone]

## 2020-05-26 NOTE — Progress Notes (Signed)
MyChart Video Visit    Virtual Visit via Video Note   This visit type was conducted due to national recommendations for restrictions regarding the COVID-19 Pandemic (e.g. social distancing) in an effort to limit this patient's exposure and mitigate transmission in our community. This patient is at least at moderate risk for complications without adequate follow up. This format is felt to be most appropriate for this patient at this time. Physical exam was limited by quality of the video and audio technology used for the visit.   Interactive audio and video communications were attempted, although failed due to patient's inability to connect to video. Continued visit with audio only interaction with patient agreement.  Patient location: home Provider location: [place] [facility]  I discussed the limitations of evaluation and management by telemedicine and the availability of in person appointments. The patient expressed understanding and agreed to proceed.  Patient: Jennifer [name]   DOB: 10/17/1940   80 y.o. Female  MRN: [id number] Visit Date: 05/26/2020  Today's healthcare provider: Mar Daring, PA-C   No chief complaint on file.  Subjective    HPI  Follow up Hospitalization  Patient was admitted to [facility] on 05/19/2020 and discharged on 05/24/2020. She was treated for hyponatremia, cholecystitis, and UTI. Treatment for this included 3% saline solution and tolvaptan. Sodium was increased to 131 at discharge. Telephone follow up was done on 05/25/2020 She reports excellent compliance with treatment. She reports this condition is improved. Sugar was 119 this morning. She reports she is adding salt to her diet. Overall, she feels her appetite has improved. Still not eating as much as she was, but is doing much better than before going into the hospital. She does mention some swelling in her legs, but feels it is from all the fluids they gave her at the hospital. She is  keeping her legs elevated and states the swelling is improving with elevation.  ----------------------------------------------------------------------------------------- -    Patient Active Problem List   Diagnosis Date Noted   Hyponatremia 05/19/2020   Chronic hyponatremia 05/19/2020   Essential hypertension, benign 02/04/2018   Iron deficiency 02/04/2018   Pelvic fracture ([place]) 01/31/2018   Multiple gastric polyps    Hx of colonic polyps    Acute upper GI bleed 07/12/2017   Liver cirrhosis secondary to NASH ([place])    Carpal tunnel syndrome on right 09/28/2016   Obesity (BMI 30.0-34.9) 09/20/2016   Upper respiratory infection 03/17/2016   Thrombocytopenia ([place]) 12/29/2015   Benign neoplasm of ascending colon    Benign neoplasm of transverse colon    Benign neoplasm of sigmoid colon    Allergic rhinitis 10/14/2014   Airway hyperreactivity 10/14/2014   Type 2 diabetes mellitus with unspecified complications ([place]) 96/75/9163   Calculus of gallbladder 10/14/2014   Esophagitis, reflux 10/14/2014   Vitamin D deficiency 10/14/2014   Past Medical History:  Diagnosis Date   Allergy    Arthritis    Asthma    Colon polyp    Fatty liver    GERD (gastroesophageal reflux disease)    Hyperlipidemia    Hypertension    Motion sickness    boats   Social History   Tobacco Use   Smoking status: Former Smoker    Packs/day: 0.50    Years: 6.00    Pack years: 3.00    Types: Cigarettes    Quit date: 05/08/1987    Years since quitting: 33.0   Smokeless tobacco: Never Used  Vaping Use  Vaping Use: Never used  Substance Use Topics   Alcohol use: Never   Drug use: Never   Allergies  Allergen Reactions   Amoxicillin Other (See Comments)    Yeast infections   Codeine Other (See Comments)    Pt denies   Hydrocodone-Acetaminophen Other (See Comments)    Pt denies   Nitrofurantoin Other (See Comments)   Nitrofurantoin Monohyd Macro      "Sugar got high"   Penicillins     Has patient had a PCN reaction causing immediate rash, facial/tongue/throat swelling, SOB or lightheadedness with hypotension: Unknown Has patient had a PCN reaction causing severe rash involving mucus membranes or skin necrosis: Unknown Has patient had a PCN reaction that required hospitalization: Unknown Has patient had a PCN reaction occurring within the last 10 years: Unknown If all of the above answers are "NO", then may proceed with Cephalosporin use.   Sulfa Antibiotics Other (See Comments)    Medications: Outpatient Medications Prior to Visit  Medication Sig   albuterol (VENTOLIN HFA) 108 (90 Base) MCG/ACT inhaler Inhale 1-2 puffs into the lungs every 6 (six) hours as needed for wheezing or shortness of breath.   Blood Glucose Monitoring Suppl (ACCU-CHEK AVIVA PLUS) w/Device KIT To check blood sugar daily   FLUZONE HIGH-DOSE QUADRIVALENT 0.7 ML SUSY    furosemide (LASIX) 20 MG tablet TAKE ONE TABLET EVERY DAY (Patient taking differently: Take 20 mg by mouth daily.)   glucose blood (ACCU-CHEK AVIVA PLUS) test strip To check blood sugar daily   Lancets (ACCU-CHEK SOFT TOUCH) lancets To check blood sugar daily   Magnesium 200 MG TABS Take 1 tablet by mouth daily.   nadolol (CORGARD) 20 MG tablet TAKE ONE-HALF TABLET BY MOUTH EVERY DAY (Patient taking differently: Take 10 mg by mouth 2 (two) times daily.)   pantoprazole (PROTONIX) 40 MG tablet TAKE ONE TABLET BY MOUTH TWICE DAILY (Patient taking differently: Take 40 mg by mouth 2 (two) times daily.)   spironolactone (ALDACTONE) 100 MG tablet TAKE ONE TABLET BY MOUTH EVERY DAY (Patient taking differently: Take 100 mg by mouth daily.)   No facility-administered medications prior to visit.    Review of Systems  Constitutional: Negative.   Respiratory: Negative.   Cardiovascular: Positive for leg swelling. Negative for chest pain and palpitations.  Gastrointestinal: Negative.    Musculoskeletal: Negative.   Neurological: Negative.       Objective    There were no vitals taken for this visit.   Physical Exam     Assessment & Plan     1. Hyponatremia Patient reports she is feeling well. Reports she feels almost like normal again. Trying to increase salt in her diet. Ask for a high salt diet plan be mailed to her house. Will need to see in person in the next 1-2 weeks to recheck labs. Patient in agreement.   2. Liver cirrhosis secondary to NASH ([place]) Appears currently stable. Patient eating without nausea or pain as she was having before. Will continue to monitor.   3. Leg swelling Patient reports was worse after hospitalization. She is elevating her legs and reports improvement.  Discussed adding compression stockings while she is awake and can remove at bedtime. Call if worsening or does not continue to improve with elevation.   4. Calculus of gallbladder without cholecystitis without obstruction Currently stable. No pain or nausea with eating. [place] has been negative for infection. Patient is not a good surgical candidate, per surgery, due to comorbidities, specifically advanced liver disease.  No follow-ups on file.     I discussed the assessment and treatment plan with the patient. The patient was provided an opportunity to ask questions and all were answered. The patient agreed with the plan and demonstrated an understanding of the instructions.   The patient was advised to call back or seek an in-person evaluation if the symptoms worsen or if the condition fails to improve as anticipated.  I provided 21 minutes of non-face-to-face time during this encounter.  Reynolds Bowl, PA-C, have reviewed all documentation for this visit. The documentation on 05/27/20 for the exam, diagnosis, procedures, and orders are all accurate and complete.  [place] [facility] [phone] (phone) [phone] (fax)  [place]

## 2020-05-26 NOTE — ED Notes (Signed)
ER provider stated that pt does not need in and out since pt was able to provide urine sample via pure wick

## 2020-05-27 ENCOUNTER — Ambulatory Visit: Payer: Self-pay

## 2020-05-27 ENCOUNTER — Observation Stay: Payer: Medicare HMO

## 2020-05-27 ENCOUNTER — Encounter: Payer: Self-pay | Admitting: Physician Assistant

## 2020-05-27 DIAGNOSIS — R188 Other ascites: Secondary | ICD-10-CM

## 2020-05-27 DIAGNOSIS — D696 Thrombocytopenia, unspecified: Secondary | ICD-10-CM | POA: Diagnosis not present

## 2020-05-27 DIAGNOSIS — K7581 Nonalcoholic steatohepatitis (NASH): Secondary | ICD-10-CM | POA: Diagnosis not present

## 2020-05-27 DIAGNOSIS — K8020 Calculus of gallbladder without cholecystitis without obstruction: Secondary | ICD-10-CM

## 2020-05-27 DIAGNOSIS — K7460 Unspecified cirrhosis of liver: Secondary | ICD-10-CM

## 2020-05-27 DIAGNOSIS — E871 Hypo-osmolality and hyponatremia: Secondary | ICD-10-CM

## 2020-05-27 DIAGNOSIS — G9341 Metabolic encephalopathy: Secondary | ICD-10-CM

## 2020-05-27 DIAGNOSIS — R000 Tachycardia, unspecified: Secondary | ICD-10-CM | POA: Diagnosis not present

## 2020-05-27 DIAGNOSIS — E118 Type 2 diabetes mellitus with unspecified complications: Secondary | ICD-10-CM | POA: Diagnosis not present

## 2020-05-27 LAB — BASIC METABOLIC PANEL
Anion gap: 11 (ref 5–15)
BUN: 12 mg/dL (ref 8–23)
CO2: 27 mmol/L (ref 22–32)
Calcium: 8.9 mg/dL (ref 8.9–10.3)
Chloride: 91 mmol/L — ABNORMAL LOW (ref 98–111)
Creatinine, Ser: 0.89 mg/dL (ref 0.44–1.00)
GFR, Estimated: >60 mL/min (ref >60)
Glucose, Bld: 117 mg/dL — ABNORMAL HIGH (ref 70–99)
Potassium: 3.7 mmol/L (ref 3.5–5.1)
Sodium: 129 mmol/L — ABNORMAL LOW (ref 135–145)

## 2020-05-27 LAB — CBC
HCT: 33.7 % — ABNORMAL LOW (ref 36.0–46.0)
Hemoglobin: 11.6 g/dL — ABNORMAL LOW (ref 12.0–15.0)
MCH: 31.1 pg (ref 26.0–34.0)
MCHC: 34.4 g/dL (ref 30.0–36.0)
MCV: 90.3 fL (ref 80.0–100.0)
Platelets: 79 10*3/uL — ABNORMAL LOW (ref 150–400)
RBC: 3.73 MIL/uL — ABNORMAL LOW (ref 3.87–5.11)
RDW: 15.2 % (ref 11.5–15.5)
WBC: 5.2 10*3/uL (ref 4.0–10.5)
nRBC: 0 % (ref 0.0–0.2)

## 2020-05-27 LAB — BODY FLUID CELL COUNT WITH DIFFERENTIAL
Eos, Fluid: 0 %
Lymphs, Fluid: 45 %
Monocyte-Macrophage-Serous Fluid: 43 %
Neutrophil Count, Fluid: 12 %
Total Nucleated Cell Count, Fluid: 213 cu mm

## 2020-05-27 LAB — PROTIME-INR
INR: 1.4 — ABNORMAL HIGH (ref 0.8–1.2)
Prothrombin Time: 16.4 seconds — ABNORMAL HIGH (ref 11.4–15.2)

## 2020-05-27 LAB — PROTEIN, PLEURAL OR PERITONEAL FLUID: Total protein, fluid: <3 g/dL

## 2020-05-27 LAB — MAGNESIUM: Magnesium: 1.6 mg/dL — ABNORMAL LOW (ref 1.7–2.4)

## 2020-05-27 LAB — ALBUMIN, PLEURAL OR PERITONEAL FLUID: Albumin, Fluid: <1 g/dL

## 2020-05-27 MED ORDER — ALBUMIN HUMAN 25 % IV SOLN
25.0000 g | Freq: Once | INTRAVENOUS | Status: AC
  Administered 2020-05-27: 25 g via INTRAVENOUS
  Filled 2020-05-27: qty 100

## 2020-05-27 MED ORDER — SODIUM CHLORIDE 0.9 % IV SOLN: 2.0000 g | INTRAVENOUS | Status: DC

## 2020-05-27 MED ORDER — SODIUM CHLORIDE 0.9 % IV SOLN
2.0000 g | INTRAVENOUS | Status: DC
  Filled 2020-05-27: qty 20

## 2020-05-27 MED ORDER — MAGNESIUM SULFATE 4 GM/100ML IV SOLN
4.0000 g | Freq: Once | INTRAVENOUS | Status: AC
  Administered 2020-05-27: 4 g via INTRAVENOUS
  Filled 2020-05-27 (×2): qty 100

## 2020-05-27 MED ORDER — SODIUM CHLORIDE 0.9 % IV SOLN
2.0000 g | INTRAVENOUS | Status: DC
  Administered 2020-05-27 – 2020-05-28 (×2): 2 g via INTRAVENOUS
  Filled 2020-05-27: qty 20
  Filled 2020-05-27: qty 2
  Filled 2020-05-27: qty 20

## 2020-05-27 NOTE — ED Notes (Addendum)
This RN and Environmental consultant assisted pt to toilet with walker. Pt urinated and had bowel movement.

## 2020-05-27 NOTE — Progress Notes (Signed)
Confirmed with [facility] Representative [place] that patient is active with [facility] for RN, PT, and OT services. This was arranged by TOC during hospitalization last week.  Jennifer [name], [place]

## 2020-05-27 NOTE — Progress Notes (Addendum)
PROGRESS NOTE    Jennifer [name]  PZW:[id number] DOB: Feb 20, 1941 DOA: 05/26/2020 PCP: Mar Daring, PA-C    Chief Complaint  Patient presents with  . Altered Mental Status    Brief Narrative:  HPI Per Dr. Karilyn Cota is a 80 y.o. female with medical history significant for nonalcoholic liver cirrhosis, esophageal varices s/p banding, chronic hyponatremia, thrombocytopenia, diet-controlled type 2 diabetes, hypertension, and asthma who presents to the ED for evaluation of altered mental status.  Patient was recently admitted 05/19/2020-05/24/2020 for acute on chronic hyponatremia.  Initial sodium was 107 compared to baseline around 127.  Nephrology were consulted and she was managed with 3% saline and tolvaptan.  On discharge, sodium was 131.  Patient was also found to have Proteus mirabilis UTI for which she completed 5 days of cephalosporin antibiotic therapy.  Patient returns with complaint of shakes of her upper extremities and feeling cold.  She has noticed increased swelling of her abdomen and bilateral lower extremities.  Reports good urine output without dysuria.  She says her last bowel movement was earlier today.  She is awake but keeps her eyes mostly closed.  She is oriented to self, place, and year but history is otherwise limited as she keeps repeating "God help me."  ED Course:  Initial vitals showed BP 105/46, pulse 58, RR 16, temp 97.8 F, SPO2 97% on room air.  Labs are notable for sodium 127, potassium 3.8, chloride 89, bicarb 28, BUN 10, creatinine 0.77, magnesium 1.4, serum glucose 142, AST 44, ALT 17, alk phos 80, total bilirubin 2.0, WBC 6.2, hemoglobin 11.9, platelets 92,000, ammonia 14, BNP 187.6, TSH 1.526.  SARS-CoV-2 PCR pending was ordered and pending.  CT head without contrast is negative for evidence of acute intracranial abnormality.  2 view chest x-ray shows low lung volumes without focal consolidation, effusion, or edema.  RUQ  ultrasound shows cholelithiasis and gallbladder sludge without evidence of acute cholecystitis.  Cirrhotic liver with associated ascites noted.  Patient was given 500 cc normal saline, IV magnesium 2 g.  The hospitalist service was consulted to admit for further evaluation and management.  Assessment & Plan:   Principal Problem:   Altered mental status Active Problems:   Type 2 diabetes mellitus with unspecified complications (HCC)   Calculus of gallbladder   Thrombocytopenia (HCC)   Liver cirrhosis secondary to NASH (HCC)   Hyponatremia  1 acute metabolic encephalopathy/?  Hepatic encephalopathy/NASH cirrhosis with ascites/esophageal varices status post banding Patient presented with reported confusion although noted to be alert and oriented x3.  Patient noted on admission to have some repetitive speech and tremors in the upper extremities.  Patient with abdominal distention and lower extremity edema with some ascites.  Ammonia levels within normal limits however concern for possible hepatic encephalopathy. Patient with complaints of some shortness of breath related to abdominal distention.  Continue current regimen of lactulose 10 g twice daily to titrate to 2-3 bowel movements per day, Xifaxan 550 mg twice daily, Lasix, spironolactone, nadolol.  Patient getting ready to go for ultrasound paracentesis. We will give a dose of IV albumin x1.  Check urine cultures, check blood cultures x2.  We will also place on IV Rocephin pending gram stain and cultures from paracentesis and for SBP prophylaxis.  Follow.  2.  Chronic hyponatremia Likely secondary to liver cirrhosis with baseline sodium approximately 127.  Currently at baseline.  Follow-up.  3.  Hypomagnesemia Patient given 2 g of IV magnesium sulfate on admission magnesium  currently at 1.6.  Will give magnesium 4 g IV x1.  Repeat labs in the morning.  4.  Chronic thrombocytopenia Likely secondary to liver cirrhosis.  No bleeding.   Follow.  5.  Diet-controlled type 2 diabetes mellitus Hemoglobin A1c 5.7 05/19/2020.  Outpatient follow-up.  6. chronic cholelithiasis Currently asymptomatic.  Outpatient follow-up with general surgery.  7.  Lower abdominal pain Status post recent treatment for UTI.  Repeat UA with cultures and sensitivities.  Placed empirically on IV Rocephin post paracentesis pending gram stain and cultures for possible SBP.  8.  Prognosis Patient with NASH, esophageal varices status post recent banding, presenting with concern for hepatic encephalopathy.  History of chronic hyponatremia.  Patient with recent hospitalization 05/19/2020-05/24/2020 with acute on chronic hyponatremia treated with 3% hypertonic saline and tolvaptan, status post full course of antibiotic treatment for UTI presenting back within a 2-week.  With abdominal ascites, concern for hepatic encephalopathy.  Patient with poor prognosis.  Patient seems to relay that procedure was recommended by GI which she cannot quite remember however stated that she would rather die than have any more further significant invasive procedures.  Consult palliative care for goals of care   DVT prophylaxis: SCDs Code Status: Full Family Communication: Updated patient and daughter, [name] at bedside. Disposition:   Status is: Observation    Dispo: The patient is from: Home              Anticipated d/c is to: SNF versus home with home health              Anticipated d/c date is: 2 to 3 days              Patient currently not medically stable for discharge per       Consultants:   None  Procedures:   Ultrasound-guided paracentesis pending 05/27/2020  Antimicrobials:   IV Rocephin 05/27/2020>>>   Subjective: Patient laying in bed.  Alert and oriented to self place and time.  Complaining of inability to take deep breaths which she feels might be coming from abdominal distention.  Complains of suprapubic lower abdominal pain and some increased  urinary frequency.  No bleeding.  No chest pain.  Daughter at bedside.  Patient stated she was offered a procedure by GI during last hospitalization however refused as she stated she would rather die.  Objective: Vitals:   05/27/20 0730 05/27/20 0800 05/27/20 0915 05/27/20 0917  BP: (!) 111/52 (!) 113/56 126/61 126/61  Pulse: 72 (!) 59 76 72  Resp:  14 14   Temp:  97.8 F (36.6 C) (!) 97.5 F (36.4 C) (!) 97.5 F (36.4 C)  TempSrc:  Oral Axillary Oral  SpO2: 99% 93% 99% 98%  Weight:      Height:        Intake/Output Summary (Last 24 hours) at 05/27/2020 1111 Last data filed at 05/26/2020 2055 Gross per 24 hour  Intake 202.08 ml  Output 1025 ml  Net -822.92 ml   Filed Weights   05/26/20 1307  Weight: 84.1 kg    Examination:  General exam: Appears calm and comfortable  Respiratory system: Decreased breath sounds in the bases otherwise clear.  No wheezing.  No crackles.  No rhonchi.  No expiratory wheezing.  Cardiovascular system: S1 & S2 heard, RRR. No JVD, murmurs, rubs, gallops or clicks.  1-2+ bilateral lower extremity edema. Gastrointestinal system: Abdomen is mildly distended, soft, some tenderness to palpation in the suprapubic region, positive bowel sounds.  No  rebound.  No guarding.  Central nervous system: Alert and oriented. No focal neurological deficits. Extremities: Symmetric 5 x 5 power. Skin: No rashes, lesions or ulcers Psychiatry: Judgement and insight appear normal. Mood & affect appropriate.     Data Reviewed: I have personally reviewed following labs and imaging studies  CBC: Recent Labs  Lab 05/26/20 1326 05/27/20 0519  WBC 6.2 5.2  HGB 11.9* 11.6*  HCT 34.6* 33.7*  MCV 91.1 90.3  PLT 92* 79*    Basic Metabolic Panel: Recent Labs  Lab 05/22/20 1935 05/23/20 0210 05/23/20 0952 05/24/20 0905 05/26/20 1326 05/27/20 0519  NA 131*  130* 130* 131* 131* 127* 129*  K 3.6  --  3.9 3.9 3.8 3.7  CL 95*  --  96* 97* 89* 91*  CO2 26  --  28 27  28 27   GLUCOSE 171*  --  144* 115* 142* 117*  BUN 7*  --  <5* 5* 10 12  CREATININE 0.73  --  0.72 0.76 0.77 0.89  CALCIUM 8.5*  --  9.0 8.8* 9.0 8.9  MG  --   --   --   --  1.4* 1.6*    GFR: Estimated Creatinine Clearance: 49.6 mL/min (by C-G formula based on SCr of 0.89 mg/dL).  Liver Function Tests: Recent Labs  Lab 05/22/20 1935 05/26/20 1326  AST 38 44*  ALT 15 17  ALKPHOS 62 80  BILITOT 1.1 2.0*  PROT 5.4* 6.2*  ALBUMIN 2.7* 3.1*    CBG: Recent Labs  Lab 05/23/20 0819 05/23/20 1220 05/23/20 1808 05/23/20 2128 05/24/20 0723  GLUCAP 112* 142* 128* 115* 111*     Recent Results (from the past 240 hour(s))  Urine Culture     Status: Abnormal   Collection Time: 05/19/20 10:36 PM   Specimen: Urine, Clean Catch  Result Value Ref Range Status   Specimen Description   Final    URINE, CLEAN CATCH Performed at [facility], [address], [place], [place] [address]    Special Requests   Final    NONE Performed at [facility], [address] [place], [place] [address]    Culture >=100,000 COLONIES/mL PROTEUS MIRABILIS (A)  Final   Report Status 05/22/2020 FINAL  Final   Organism ID, Bacteria PROTEUS MIRABILIS (A)  Final      Susceptibility   Proteus mirabilis - MIC*    AMPICILLIN <=2 SENSITIVE Sensitive     CEFAZOLIN <=4 SENSITIVE Sensitive     CEFEPIME <=0.12 SENSITIVE Sensitive     CEFTRIAXONE <=0.25 SENSITIVE Sensitive     CIPROFLOXACIN <=0.25 SENSITIVE Sensitive     GENTAMICIN <=1 SENSITIVE Sensitive     IMIPENEM 2 SENSITIVE Sensitive     NITROFURANTOIN 128 RESISTANT Resistant     TRIMETH/SULFA <=20 SENSITIVE Sensitive     AMPICILLIN/SULBACTAM <=2 SENSITIVE Sensitive     PIP/TAZO <=4 SENSITIVE Sensitive     * >=100,000 COLONIES/mL PROTEUS MIRABILIS  SARS CORONAVIRUS 2 (TAT 6-24 HRS) Nasopharyngeal Nasopharyngeal Swab     Status: None   Collection Time: 05/20/20  7:28 AM   Specimen: Nasopharyngeal Swab  Result Value Ref Range  Status   SARS Coronavirus 2 NEGATIVE NEGATIVE Final    Comment: (NOTE) SARS-CoV-2 target nucleic acids are NOT DETECTED.  The SARS-CoV-2 RNA is generally detectable in upper and lower respiratory specimens during the acute phase of infection. Negative results do not preclude SARS-CoV-2 infection, do not rule out co-infections with other pathogens, and should not be used  as the sole basis for treatment or other patient management decisions. Negative results must be combined with clinical observations, patient history, and epidemiological information. The expected result is Negative.  Fact Sheet for Patients: SugarRoll.be  Fact Sheet for Healthcare Providers: https://www.woods-mathews.com/  This test is not yet approved or cleared by the [place] FDA and  has been authorized for detection and/or diagnosis of SARS-CoV-2 by FDA under an Emergency Use Authorization (EUA). This EUA will remain  in effect (meaning this test can be used) for the duration of the COVID-19 declaration under Se ction 564(b)(1) of the Act, 21 U.S.C. section 360bbb-3(b)(1), unless the authorization is terminated or revoked sooner.  Performed at [facility], [place] [address], [place], [place] [address]   Resp Panel by RT-PCR (Flu A&B, Covid) Nasopharyngeal Swab     Status: None   Collection Time: 05/26/20  7:22 PM   Specimen: Nasopharyngeal Swab; Nasopharyngeal(NP) swabs in vial transport medium  Result Value Ref Range Status   SARS Coronavirus 2 by RT PCR NEGATIVE NEGATIVE Final    Comment: (NOTE) SARS-CoV-2 target nucleic acids are NOT DETECTED.  The SARS-CoV-2 RNA is generally detectable in upper respiratory specimens during the acute phase of infection. The lowest concentration of SARS-CoV-2 viral copies this assay can detect is 138 copies/mL. A negative result does not preclude SARS-Cov-2 infection and should not be used as the sole basis for  treatment or other patient management decisions. A negative result may occur with  improper specimen collection/handling, submission of specimen other than nasopharyngeal swab, presence of viral mutation(s) within the areas targeted by this assay, and inadequate number of viral copies(<138 copies/mL). A negative result must be combined with clinical observations, patient history, and epidemiological information. The expected result is Negative.  Fact Sheet for Patients:  EntrepreneurPulse.com.au  Fact Sheet for Healthcare Providers:  IncredibleEmployment.be  This test is no t yet approved or cleared by the [place] FDA and  has been authorized for detection and/or diagnosis of SARS-CoV-2 by FDA under an Emergency Use Authorization (EUA). This EUA will remain  in effect (meaning this test can be used) for the duration of the COVID-19 declaration under Section 564(b)(1) of the Act, 21 U.S.C.section 360bbb-3(b)(1), unless the authorization is terminated  or revoked sooner.       Influenza A by PCR NEGATIVE NEGATIVE Final   Influenza B by PCR NEGATIVE NEGATIVE Final    Comment: (NOTE) The Xpert Xpress SARS-CoV-2/FLU/RSV plus assay is intended as an aid in the diagnosis of influenza from Nasopharyngeal swab specimens and should not be used as a sole basis for treatment. Nasal washings and aspirates are unacceptable for Xpert Xpress SARS-CoV-2/FLU/RSV testing.  Fact Sheet for Patients: EntrepreneurPulse.com.au  Fact Sheet for Healthcare Providers: IncredibleEmployment.be  This test is not yet approved or cleared by the [place] FDA and has been authorized for detection and/or diagnosis of SARS-CoV-2 by FDA under an Emergency Use Authorization (EUA). This EUA will remain in effect (meaning this test can be used) for the duration of the COVID-19 declaration under Section 564(b)(1) of the Act, 21  U.S.C. section 360bbb-3(b)(1), unless the authorization is terminated or revoked.  Performed at [facility], [address], [place],  [address]          Radiology Studies: DG Chest 2 View  Result Date: 05/26/2020 CLINICAL DATA:  Low blood pressure EXAM: CHEST - 2 VIEW COMPARISON:  01/30/2018 FINDINGS: Low lung volumes. No focal opacity or pleural effusion. Stable cardiomediastinal silhouette.  Stable nodularity projecting over right first rib end. No pneumothorax. IMPRESSION: No active cardiopulmonary disease. Electronically Signed   By: Donavan Foil M.D.   On: 05/26/2020 17:23   CT Head Wo Contrast  Result Date: 05/26/2020 CLINICAL DATA:  Mental status change.  Confusion. EXAM: CT HEAD WITHOUT CONTRAST TECHNIQUE: Contiguous axial images were obtained from the base of the skull through the vertex without intravenous contrast. COMPARISON:  CT head February 18, 2010. FINDINGS: Brain: No evidence of acute large vascular territory infarction, hemorrhage, hydrocephalus, extra-axial collection or mass lesion/mass effect. Vascular: No hyperdense vessel identified. Calcific atherosclerosis. Skull: No acute fracture. Sinuses/Orbits: Visualized sinuses are clear. Unremarkable visualized orbits. Other: No mastoid effusions. IMPRESSION: No evidence of acute intracranial abnormality. Electronically Signed   By: Margaretha Sheffield MD   On: 05/26/2020 17:01   US ABDOMEN LIMITED RUQ (LIVER/GB)  Result Date: 05/26/2020 CLINICAL DATA:  Bilirubinemia. EXAM: ULTRASOUND ABDOMEN LIMITED RIGHT UPPER QUADRANT COMPARISON:  None. FINDINGS: Gallbladder: Shadowing echogenic gallstones are seen within the gallbladder lumen. The largest measures approximately 1.1 cm. Heterogeneous gallbladder sludge is also noted. The gallbladder wall measures 12.5 mm in thickness. No sonographic Murphy sign noted by sonographer. Common bile duct: Diameter: 4.1 mm Liver: The liver is nodular in contour and demonstrates  coarse echotexture. No focal lesion identified. Within normal limits in parenchymal echogenicity. Portal vein is patent on color Doppler imaging with normal direction of blood flow towards the liver. Other: Free fluid is seen within the abdomen. IMPRESSION: 1. Cholelithiasis and gallbladder sludge, without evidence of acute cholecystitis. 2. Cirrhotic liver with associated ascites. Electronically Signed   By: Virgina Norfolk M.D.   On: 05/26/2020 18:13        Scheduled Meds: . furosemide  20 mg Oral Daily  . lactulose  10 g Oral BID  . nadolol  10 mg Oral Daily  . rifaximin  550 mg Oral BID  . spironolactone  100 mg Oral Daily   Continuous Infusions: . magnesium sulfate bolus IVPB       LOS: 0 days    Time spent: 40 minutes    Irine Seal, MD [facility]   To contact the attending provider between 7A-7P or the covering provider during after hours 7P-7A, please log into the web site [email or web address] and access using universal [place] password for that web site. If you do not have the password, please call the hospital operator.  05/27/2020, 11:11 AM

## 2020-05-27 NOTE — Plan of Care (Signed)

## 2020-05-27 NOTE — Evaluation (Addendum)
Physical Therapy Evaluation Patient Details Name: Jennifer [name] MRN: [id number] DOB: April 04, 1941 Today's Date: 05/27/2020   History of Present Illness  80 y.o. female with medical history significant for nonalcoholic liver cirrhosis, esophageal varices s/p banding, chronic hyponatremia, thrombocytopenia, diet-controlled type 2 diabetes, hypertension, and asthma who presents to the ED for evaluation of altered mental status  Clinical Impression  Pt is a pleasant 80 year old female who was admitted for AMS. Recent discharge with limited time at home. Reports she doesn't have enough support at home, however later reports she only wants to go home and doesn't want to go to SNF. Reviewed safety. Pt performs bed mobility/transfers with min assist and ambulation with cga and RW. Performed 5TSTS in 27 seconds, demonstrating poor power. Pt demonstrates deficits with strength/mobility/endurance. Isn't at baseline level. Would benefit from skilled PT to address above deficits and promote optimal return to PLOF; recommend transition to STR upon discharge from acute hospitalization.    Follow Up Recommendations SNF    Equipment Recommendations  None recommended by PT    Recommendations for Other Services       Precautions / Restrictions Precautions Precautions: Fall Restrictions Weight Bearing Restrictions: No      Mobility  Bed Mobility Overal bed mobility: Needs Assistance Bed Mobility: Supine to Sit;Sit to Supine;Rolling     Supine to sit: Min assist     General bed mobility comments: needs cues for initiation. Needs assist for sliding B LE off bed. Once seated at EOB, able to sit with upright posture    Transfers Overall transfer level: Needs assistance Equipment used: Rolling walker (2 wheeled) Transfers: Sit to/from Stand Sit to Stand: Min assist         General transfer comment: Needs cues to push from seated surface however attempts to pull up on  RW.  Ambulation/Gait Ambulation/Gait assistance: Min guard Gait Distance (Feet): 5 Feet Assistive device: Rolling walker (2 wheeled) Gait Pattern/deviations: Step-to pattern     General Gait Details: ambulated over to recliner with safe technique. Needs cues for safety.  Stairs            Wheelchair Mobility    Modified Rankin (Stroke Patients Only)       Balance Overall balance assessment: Needs assistance Sitting-balance support: No upper extremity supported;Feet supported Sitting balance-Leahy Scale: Fair     Standing balance support: Bilateral upper extremity supported Standing balance-Leahy Scale: Fair                               Pertinent Vitals/Pain Pain Assessment: No/denies pain    Home Living Family/patient expects to be discharged to:: Private residence Living Arrangements: Children (son) Available Help at Discharge: Available PRN/intermittently Type of Home: House Home Access: Stairs to enter Entrance Stairs-Rails: Left Entrance Stairs-Number of Steps: 3 Home Layout: One level Home Equipment: Walker - 2 wheels Additional Comments: pt reports that she is asking church family if they can build her additional railing    Prior Function Level of Independence: Independent with assistive device(s)         Comments: Ind amb community distances without an AD, no fall history, Ind with ADLs prior to last admission     Hand Dominance   Dominant Hand: Right    Extremity/Trunk Assessment   Upper Extremity Assessment Upper Extremity Assessment: Generalized weakness (B UE grossly 4/5)    Lower Extremity Assessment Lower Extremity Assessment: Generalized weakness (B LE grossly 4+/5)  Communication   Communication: No difficulties  Cognition Arousal/Alertness: Awake/alert Behavior During Therapy: WFL for tasks assessed/performed;Anxious Overall Cognitive Status: Impaired/Different from baseline                                  General Comments: confused, reports she hasn't had any visitors today although documented visitors. Does appear anxious when talking about discharge disposition.      General Comments      Exercises Other Exercises Other Exercises: Seated ther-ex performed on B LE including hip add squeezes, alt. marching, LAQ x 10 reps with supervision and safe technique   Assessment/Plan    PT Assessment Patient needs continued PT services  PT Problem List Decreased strength;Decreased activity tolerance;Decreased balance;Decreased mobility;Decreased knowledge of use of DME       PT Treatment Interventions DME instruction;Gait training;Therapeutic activities;Therapeutic exercise;Balance training    PT Goals (Current goals can be found in the Care Plan section)  Acute Rehab PT Goals Patient Stated Goal: To get healthy PT Goal Formulation: With patient Time For Goal Achievement: 06/10/20 Potential to Achieve Goals: Good    Frequency Min 2X/week   Barriers to discharge Decreased caregiver support      Co-evaluation               AM-PAC PT "6 Clicks" Mobility  Outcome Measure Help needed turning from your back to your side while in a flat bed without using bedrails?: A Little Help needed moving from lying on your back to sitting on the side of a flat bed without using bedrails?: A Little Help needed moving to and from a bed to a chair (including a wheelchair)?: A Little Help needed standing up from a chair using your arms (e.g., wheelchair or bedside chair)?: A Little Help needed to walk in hospital room?: A Little Help needed climbing 3-5 steps with a railing? : A Lot 6 Click Score: 17    End of Session Equipment Utilized During Treatment: Gait belt Activity Tolerance: Patient tolerated treatment well Patient left: in chair;with chair alarm set Nurse Communication: Mobility status PT Visit Diagnosis: Difficulty in walking, not elsewhere classified  (R26.2);Muscle weakness (generalized) (M62.81)    Time: 1517-6160 PT Time Calculation (min) (ACUTE ONLY): 35 min   Charges:   PT Evaluation $PT Eval Low Complexity: 1 Low PT Treatments $Therapeutic Exercise: 8-22 mins        Jennifer [name], PT, DPT [phone]   Jennifer [name] 05/27/2020, 4:58 PM

## 2020-05-27 NOTE — ED Notes (Signed)
Pt given a warm blanket 

## 2020-05-27 NOTE — Chronic Care Management (AMB) (Signed)
  Chronic Care Management   Outreach Note  05/27/2020 Name: [name] MRN: [id number] DOB: 1940/09/14  Primary Care Provider: Mar Daring, PA-C Reason for referral : Red EMMI/Provider follow-up/Transportation   Red EMMI notification received for Ms. Rosanna Randy. Per chart review she is currently admitted at [facility]. The chronic care management team will plan to engage following hospital discharge.    Follow Up Plan:  Pending discharge disposition.     [facility] [phone]

## 2020-05-27 NOTE — ED Notes (Signed)
Pt provided with breakfast tray.  Pt assisted to seated position in the bed and denies any other current needs.

## 2020-05-27 NOTE — Evaluation (Signed)
Occupational Therapy Evaluation Patient Details Name: Jennifer [name] MRN: [id number] DOB: October 27, 1940 Today's Date: 05/27/2020    History of Present Illness 80 y.o. female with medical history significant for nonalcoholic liver cirrhosis, esophageal varices s/p banding, chronic hyponatremia, thrombocytopenia, diet-controlled type 2 diabetes, hypertension, and asthma who presents to the ED for evaluation of altered mental status   Clinical Impression   Patient presenting with decreased I in self care, balance, functional mobility/transfers, endurance, and safety awareness. Patient's daughter present in room who confirms baseline. Pt recently hospitalized and at home for 1 day before returning back to hospital. Pt reports living alone with son who lives nearby but is unable to provide 24/7 and was unable to assist her this last discharge. Prior to hospital admissions, pt reports being mod I with use of RW and living independently. Patient currently very pleasant but requires maximal cuing for encouragement. Pt performed supine >sit with min A. Pt then becomes very tremulous when asked to stand and appears very anvious. Pt needing mi - mod A of 2 for standing and to take steps forwards and backwards. Pt agrees she needs higher level of care and states, " I can't go back home". OT recommends short term rehab at discharge to address functional deficits. Patient will benefit from acute OT to increase overall independence in the areas of ADLs, functional mobility, and safety awareness in order to safely discharge to next venue of care.    Follow Up Recommendations  SNF;Supervision/Assistance - 24 hour    Equipment Recommendations  Other (comment) (defer to next venue of care)       Precautions / Restrictions Precautions Precautions: Fall Precaution Comments: Seizure precautions      Mobility Bed Mobility Overal bed mobility: Needs Assistance Bed Mobility: Supine to Sit;Sit to  Supine;Rolling Rolling: Min assist   Supine to sit: Min assist Sit to supine: Mod assist   General bed mobility comments: minA for supine>sit for trunk. Mod A for B LEs sit>supine         Balance Overall balance assessment: Needs assistance Sitting-balance support: No upper extremity supported;Feet supported Sitting balance-Leahy Scale: Fair     Standing balance support: During functional activity;Bilateral upper extremity supported Standing balance-Leahy Scale: Poor Standing balance comment: +2 for safety as pt becomes very tremulous with mobility secondary to anxiety          ADL either performed or assessed with clinical judgement   ADL Overall ADL's : Needs assistance/impaired     Grooming: Wash/dry hands;Wash/dry face;Oral care;Sitting;Set up;Supervision/safety              Vision Patient Visual Report: No change from baseline              Pertinent Vitals/Pain Pain Assessment: No/denies pain     Hand Dominance Right   Extremity/Trunk Assessment Upper Extremity Assessment Upper Extremity Assessment: Generalized weakness   Lower Extremity Assessment Lower Extremity Assessment: Generalized weakness       Communication Communication Communication: No difficulties   Cognition Arousal/Alertness: Awake/alert Behavior During Therapy: WFL for tasks assessed/performed;Anxious Overall Cognitive Status: Within Functional Limits for tasks assessed           General Comments: alert and oriented x4              Home Living Family/patient expects to be discharged to:: Private residence Living Arrangements: Children (son) Available Help at Discharge: Family;Available 24 hours/day Type of Home: House Home Access: Stairs to enter [employer] of Steps: 3 Entrance Stairs-Rails:  Left Home Layout: One level     Bathroom Shower/Tub: Tub/shower unit;Walk-in shower   Bathroom Toilet: Standard     Home Equipment: Walker - 2 wheels           Prior Functioning/Environment Level of Independence: Independent with assistive device(s)        Comments: Ind amb community distances without an AD, no fall history, Ind with ADLs prior to last admission        OT Problem List: Decreased strength;Decreased activity tolerance;Impaired balance (sitting and/or standing);Decreased safety awareness;Decreased knowledge of use of DME or AE;Decreased cognition      OT Treatment/Interventions: Self-care/ADL training;Therapeutic exercise;Energy conservation;Therapeutic activities;Balance training;Patient/family education;Manual therapy;DME and/or AE instruction    OT Goals(Current goals can be found in the care plan section) ADL Goals Pt Will Perform Grooming: standing;with supervision Pt Will Perform Lower Body Dressing: with supervision;sit to/from stand Pt Will Transfer to Toilet: with supervision;ambulating Pt Will Perform Toileting - Clothing Manipulation and hygiene: with supervision;sit to/from stand  OT Frequency: Min 1X/week   Barriers to D/C: Decreased caregiver support  does not have 24/7 assist at home          AM-PAC OT "6 Clicks" Daily Activity     Outcome Measure Help from another person eating meals?: None Help from another person taking care of personal grooming?: A Little Help from another person toileting, which includes using toliet, bedpan, or urinal?: Total Help from another person bathing (including washing, rinsing, drying)?: A Lot Help from another person to put on and taking off regular upper body clothing?: A Little Help from another person to put on and taking off regular lower body clothing?: A Lot 6 Click Score: 15   End of Session Nurse Communication: Mobility status  Activity Tolerance: Patient limited by fatigue Patient left: in bed;with call bell/phone within reach  OT Visit Diagnosis: Unsteadiness on feet (R26.81);Muscle weakness (generalized) (M62.81)                Time: 2334-3568 OT  Time Calculation (min): 26 min Charges:  OT General Charges $OT Visit: 1 Visit OT Evaluation $OT Eval Moderate Complexity: 1 Mod OT Treatments $Therapeutic Activity: 8-22 mins  05/27/2020, 12:38 PM

## 2020-05-27 NOTE — ED Notes (Signed)
Entered room to find pt leaning on R stretcher rail -- pt repeatedly saying "its coming out.. its coming out... its coming out my eyes" -- when probed further pt acknowledges that she needs to have another BM (reportedly had one prior to this nurse assuming care) -- when pt placed on bedpan only smear of stool passed-- pt however continues to exhibit period of acute confusion - unable to state her name or where she is-- unable to reorient at this time -- pt also observed holding BUE out in front of her in rigid state (per report pt has h/o of periods of confusion at baseline) -- remains on continuous cardiac monitoring and continuous pulse ox -- will continue to monitor.

## 2020-05-27 NOTE — ED Notes (Signed)
Pt now more alert -- now oriented to person and place -- denies pain at this time or other complaints.  Will continue to monitor for acute changes.

## 2020-05-27 NOTE — ED Notes (Signed)
Pt provided with new pure wick after unmeasured bowel movement occurred.  New bed pad, top blankets, and new brief provided.  Pt able to moderately assist with rolling.

## 2020-05-28 DIAGNOSIS — K7581 Nonalcoholic steatohepatitis (NASH): Secondary | ICD-10-CM | POA: Diagnosis not present

## 2020-05-28 DIAGNOSIS — R000 Tachycardia, unspecified: Secondary | ICD-10-CM

## 2020-05-28 DIAGNOSIS — E118 Type 2 diabetes mellitus with unspecified complications: Secondary | ICD-10-CM | POA: Diagnosis not present

## 2020-05-28 DIAGNOSIS — I4581 Long QT syndrome: Secondary | ICD-10-CM | POA: Diagnosis not present

## 2020-05-28 DIAGNOSIS — E871 Hypo-osmolality and hyponatremia: Secondary | ICD-10-CM | POA: Diagnosis not present

## 2020-05-28 DIAGNOSIS — R188 Other ascites: Secondary | ICD-10-CM | POA: Diagnosis not present

## 2020-05-28 DIAGNOSIS — D696 Thrombocytopenia, unspecified: Secondary | ICD-10-CM | POA: Diagnosis not present

## 2020-05-28 DIAGNOSIS — K7200 Acute and subacute hepatic failure without coma: Secondary | ICD-10-CM

## 2020-05-28 DIAGNOSIS — K8020 Calculus of gallbladder without cholecystitis without obstruction: Secondary | ICD-10-CM | POA: Diagnosis not present

## 2020-05-28 DIAGNOSIS — R079 Chest pain, unspecified: Secondary | ICD-10-CM | POA: Diagnosis not present

## 2020-05-28 DIAGNOSIS — G9341 Metabolic encephalopathy: Secondary | ICD-10-CM | POA: Diagnosis not present

## 2020-05-28 LAB — BASIC METABOLIC PANEL
Anion gap: 15 (ref 5–15)
BUN: 16 mg/dL (ref 8–23)
CO2: 26 mmol/L (ref 22–32)
Calcium: 9.1 mg/dL (ref 8.9–10.3)
Chloride: 91 mmol/L — ABNORMAL LOW (ref 98–111)
Creatinine, Ser: 0.8 mg/dL (ref 0.44–1.00)
GFR, Estimated: >60 mL/min (ref >60)
Glucose, Bld: 139 mg/dL — ABNORMAL HIGH (ref 70–99)
Potassium: 3.5 mmol/L (ref 3.5–5.1)
Sodium: 132 mmol/L — ABNORMAL LOW (ref 135–145)

## 2020-05-28 LAB — COMPREHENSIVE METABOLIC PANEL
ALT: 17 U/L (ref 0–44)
AST: 38 U/L (ref 15–41)
Albumin: 3.1 g/dL — ABNORMAL LOW (ref 3.5–5.0)
Alkaline Phosphatase: 64 U/L (ref 38–126)
Anion gap: 13 (ref 5–15)
BUN: 14 mg/dL (ref 8–23)
CO2: 26 mmol/L (ref 22–32)
Calcium: 9.1 mg/dL (ref 8.9–10.3)
Chloride: 91 mmol/L — ABNORMAL LOW (ref 98–111)
Creatinine, Ser: 0.73 mg/dL (ref 0.44–1.00)
GFR, Estimated: >60 mL/min (ref >60)
Glucose, Bld: 125 mg/dL — ABNORMAL HIGH (ref 70–99)
Potassium: 3.6 mmol/L (ref 3.5–5.1)
Sodium: 130 mmol/L — ABNORMAL LOW (ref 135–145)
Total Bilirubin: 1.7 mg/dL — ABNORMAL HIGH (ref 0.3–1.2)
Total Protein: 5.9 g/dL — ABNORMAL LOW (ref 6.5–8.1)

## 2020-05-28 LAB — PROTEIN, BODY FLUID (OTHER): Total Protein, Body Fluid Other: 0.7 g/dL

## 2020-05-28 LAB — CBC
HCT: 35.3 % — ABNORMAL LOW (ref 36.0–46.0)
Hemoglobin: 12.3 g/dL (ref 12.0–15.0)
MCH: 30.9 pg (ref 26.0–34.0)
MCHC: 34.8 g/dL (ref 30.0–36.0)
MCV: 88.7 fL (ref 80.0–100.0)
Platelets: 84 10*3/uL — ABNORMAL LOW (ref 150–400)
RBC: 3.98 MIL/uL (ref 3.87–5.11)
RDW: 15.2 % (ref 11.5–15.5)
WBC: 5.5 10*3/uL (ref 4.0–10.5)
nRBC: 0 % (ref 0.0–0.2)

## 2020-05-28 LAB — MAGNESIUM
Magnesium: 1.9 mg/dL (ref 1.7–2.4)
Magnesium: 2 mg/dL (ref 1.7–2.4)

## 2020-05-28 LAB — TROPONIN I (HIGH SENSITIVITY)
Troponin I (High Sensitivity): 66 ng/L — ABNORMAL HIGH (ref <18)
Troponin I (High Sensitivity): 69 ng/L — ABNORMAL HIGH (ref <18)

## 2020-05-28 LAB — GLUCOSE, CAPILLARY: Glucose-Capillary: 127 mg/dL — ABNORMAL HIGH (ref 70–99)

## 2020-05-28 MED ORDER — ALUM & MAG HYDROXIDE-SIMETH 200-200-20 MG/5ML PO SUSP: 30.0000 mL | Freq: Three times a day (TID) | ORAL | Status: DC | PRN

## 2020-05-28 MED ORDER — POTASSIUM CHLORIDE 20 MEQ PO PACK
40.0000 meq | PACK | Freq: Once | ORAL | Status: AC
  Administered 2020-05-28: 40 meq via ORAL
  Filled 2020-05-28: qty 2

## 2020-05-28 MED ORDER — LACTULOSE 10 GM/15ML PO SOLN
20.0000 g | Freq: Once | ORAL | Status: AC
  Administered 2020-05-28: 20 g via ORAL
  Filled 2020-05-28: qty 30

## 2020-05-28 MED ORDER — LACTULOSE 10 GM/15ML PO SOLN
30.0000 g | Freq: Two times a day (BID) | ORAL | Status: DC
  Administered 2020-05-29: 30 g via ORAL
  Filled 2020-05-28 (×2): qty 60

## 2020-05-28 NOTE — Progress Notes (Signed)
   05/28/20 1511  Clinical Encounter Type  Visited With Patient  Visit Type Initial;Spiritual support  Referral From Nurse  Consult/Referral To Chaplain   Chaplain Hadiya Spoerl responded to a rapid response page in room 137-A Mrs. Jennifer [name]. Pt possibly not breathing. Nurses assessed Pt, Pt is now alert and following commands. Pt. Denies any pain or shortness of breath.

## 2020-05-28 NOTE — Plan of Care (Addendum)
Pt alert and disoriented to situation, on room air, afebrile, pt incontinent of bowel and bladder, on pure wick. She denied any complains of pain, nausea, SOB. Pt on multiple occasions said "oh God, Jesus save me". Sore perirenal area with barrier cream applied. Falls precautions remained in place, call bell within reach  [phone]: Daughter called [name]- [phone]), she will like to be called for the update today as the brother who will visit today is disabled Problem: Education: Goal: Knowledge of General Education information will improve Description: Including pain rating scale, medication(s)/side effects and non-pharmacologic comfort measures Outcome: Progressing   Problem: Activity: Goal: Risk for activity intolerance will decrease Outcome: Progressing   Problem: Coping: Goal: Level of anxiety will decrease Outcome: Progressing   Problem: Elimination: Goal: Will not experience complications related to bowel motility Outcome: Progressing   Problem: Pain Managment: Goal: General experience of comfort will improve Outcome: Progressing   Problem: Safety: Goal: Ability to remain free from injury will improve Outcome: Progressing

## 2020-05-28 NOTE — Progress Notes (Signed)
Patient disoriented per RN and chart review. CSW attempted call to patient's son [name], left a voicemail. Left handoff for weekend TOC to follow up on SNF recommendation.   Oleh Genin, [place]

## 2020-05-28 NOTE — Significant Event (Signed)
Rapid Response Event Note   Reason for Call :  Ventricular tachycardia/ventricular fibrillation  Initial Focused Assessment:  Rapid response RN arrived in patient room's with patient yelling out lying in bed with 1A staff surrounding her. Per report from 1A staff, they were notified from central telemetry monitoring that patient was in vfib/vtach. Patient checked on by staff after call and patient was restless and moving a lot in bed. Upon this assessment patient was very confused and restless but staff reported patient had been like this the whole admission. Patient alert. CBG 127. Potassium 3.6 and Magnesium 1.9 this morning on labs. Reviewed telemetry strips in the computer. Unable to tell if artifact or actually Vfib/Vtach. Current rhythm looked to be sinus rhythm.  Interventions:  12 lead EKG obtained.   Plan of Care:  Patient to stay on 1A for now. Dr. Grandville Silos personally assessed patient and then reviewed patient's telemetry strips, 12 lead EKGs, and labs and is planning to make changes as needed. 1A staff instructed to recall rapid response as needed.  Event Summary:   MD Notified: Dr. Grandville Silos Call Time: 15: 05 Arrival Time: 15:07  End Time: 15:27  Ceili Boshers, Jaynie Bream, RN

## 2020-05-28 NOTE — Progress Notes (Signed)
PT Cancellation Note  Patient Details Name: Jennifer [name] MRN: [id number] DOB: Sep 22, 1940   Cancelled Treatment:    Reason Eval/Treat Not Completed: Other (comment). Pt with RN and CNA at bedside, pt with elevated HR (monitor showing 130s-momentarily 150s). PT to hold and re-attempt as patient is available and medically appropriate.  Lieutenant Diego PT, DPT 2:57 PM,05/28/20

## 2020-05-28 NOTE — Progress Notes (Signed)
Was called by nursing that the rapid response had been called due to concerns that patient was not V. tach/V. fib on monitor. Came and assessed patient patient alert, following commands.  Patient denies any chest pain.  Patient denies any shortness of breath. General: NAD Respiratory: Clear to auscultation bilaterally.  No wheezes, no crackles, no rhonchi Cardiovascular: Regular rate rhythm no murmurs rubs or gallops.  No JVD.  1-2+ bilateral lower extremity edema GI: Abdomen is soft, mildly distended, nontender to palpation, no rebound, no guarding. Extremities: No clubbing no cyanosis 1-2+ bilateral lower extremity edema  Assessment/plan #1 tachycardia/??  V. tach Was called by RN due to concerns for V. tach.  Came and assessed patient patient in normal sinus rhythm and asymptomatic.  EKG done concerning for lead reversal.  Repeat EKG done with normal sinus rhythm with low voltage with no significant ischemic changes noted. Telemetry monitor with some artifact noted and tachycardia with rates as high as 200.  Also it was noted that patient did have some tremors during this time.  We will give a dose of potassium 40 mEq p.o. x1 to keep potassium > 4. Check a basic metabolic profile, magnesium, cycle cardiac enzymes, check a 2D echo.  Supportive care.  Continue home regimen nadolol  Follow.

## 2020-05-28 NOTE — Progress Notes (Addendum)
PROGRESS NOTE    Jennifer [name]  GEZ:[id number] DOB: 04-17-1941 DOA: 05/26/2020 PCP: Mar Daring, PA-C    Chief Complaint  Patient presents with  . Altered Mental Status    Brief Narrative:  HPI Per Dr. Karilyn Cota is a 80 y.o. female with medical history significant for nonalcoholic liver cirrhosis, esophageal varices s/p banding, chronic hyponatremia, thrombocytopenia, diet-controlled type 2 diabetes, hypertension, and asthma who presents to the ED for evaluation of altered mental status.  Patient was recently admitted 05/19/2020-05/24/2020 for acute on chronic hyponatremia.  Initial sodium was 107 compared to baseline around 127.  Nephrology were consulted and she was managed with 3% saline and tolvaptan.  On discharge, sodium was 131.  Patient was also found to have Proteus mirabilis UTI for which she completed 5 days of cephalosporin antibiotic therapy.  Patient returns with complaint of shakes of her upper extremities and feeling cold.  She has noticed increased swelling of her abdomen and bilateral lower extremities.  Reports good urine output without dysuria.  She says her last bowel movement was earlier today.  She is awake but keeps her eyes mostly closed.  She is oriented to self, place, and year but history is otherwise limited as she keeps repeating "God help me."  ED Course:  Initial vitals showed BP 105/46, pulse 58, RR 16, temp 97.8 F, SPO2 97% on room air.  Labs are notable for sodium 127, potassium 3.8, chloride 89, bicarb 28, BUN 10, creatinine 0.77, magnesium 1.4, serum glucose 142, AST 44, ALT 17, alk phos 80, total bilirubin 2.0, WBC 6.2, hemoglobin 11.9, platelets 92,000, ammonia 14, BNP 187.6, TSH 1.526.  SARS-CoV-2 PCR pending was ordered and pending.  CT head without contrast is negative for evidence of acute intracranial abnormality.  2 view chest x-ray shows low lung volumes without focal consolidation, effusion, or edema.  RUQ  ultrasound shows cholelithiasis and gallbladder sludge without evidence of acute cholecystitis.  Cirrhotic liver with associated ascites noted.  Patient was given 500 cc normal saline, IV magnesium 2 g.  The hospitalist service was consulted to admit for further evaluation and management.  Assessment & Plan:   Principal Problem:   Altered mental status Active Problems:   Type 2 diabetes mellitus with unspecified complications (HCC)   Calculus of gallbladder   Thrombocytopenia (HCC)   Liver cirrhosis secondary to NASH (HCC)   Hyponatremia   Acute metabolic encephalopathy   Ascites   Hypomagnesemia  1 acute metabolic encephalopathy/?  Hepatic encephalopathy/NASH cirrhosis with ascites/esophageal varices status post banding Patient presented with reported confusion some transient improvement but still confused.  Patient noted on admission to have some repetitive speech and tremors in the upper extremities.  Patient with abdominal distention and lower extremity edema with some ascites.  Ammonia levels within normal limits however concern for possible hepatic encephalopathy. Patient confused this morning.  Likely still secondary to hepatic encephalopathy.  Increase lactulose to 30 g twice daily and uptitrate further if needed to have 3-5 bowel movements per day.  Continue Xifaxan 550 mg twice daily, Lasix, spironolactone, nadolol.  Patient's primary gastroenterologist, Dr. Marius Ditch informed of patient's admission who did a social visit.  Continue IV Rocephin another 24 hours and if cultures remain negative will discontinue antibiotics.  Supportive care.  Follow.  2.  Chronic hyponatremia Likely secondary to liver cirrhosis with baseline sodium approximately 130.  Currently at baseline.  Follow-up.  3.  Hypomagnesemia Patient given 6 g of IV magnesium sulfate in total during  this hospitalization.  Magnesium at 1.9 this morning.  Follow.   4.  Chronic thrombocytopenia Likely secondary to liver  cirrhosis.  No bleeding.  Follow.  5.  Diet-controlled type 2 diabetes mellitus Hemoglobin A1c 5.7 05/19/2020.  CBG 111 this morning.  Outpatient follow-up.  6. chronic cholelithiasis Currently asymptomatic.  Outpatient follow-up with general surgery.  7.  Lower abdominal pain Status post recent treatment for UTI.  Repeat UA with cultures and sensitivities.  Placed empirically on IV Rocephin post paracentesis pending gram stain and cultures for possible SBP.  Urine cultures pending.  If urine cultures with no results in the next 24 hours we will discontinue IV Rocephin as gram stain and cultures from paracentesis negative for  8.  Prognosis Patient with NASH, esophageal varices status post recent banding, presenting with concern for hepatic encephalopathy.  History of chronic hyponatremia.  Patient with recent hospitalization 05/19/2020-05/24/2020 with acute on chronic hyponatremia treated with 3% hypertonic saline and tolvaptan, status post full course of antibiotic treatment for UTI presenting back within a 2-week.  With abdominal ascites, concern for hepatic encephalopathy.  Patient with poor prognosis.  Patient seems to relay that procedure was recommended by GI which she cannot quite remember however stated that she would rather die than have any more further significant invasive procedures.  Palliative care consulted and recommending outpatient follow-up with palliative care on discharge at SNF.     DVT prophylaxis: SCDs Code Status: Full Family Communication: Updated patient.  No family at bedside. Disposition:   Status is: Observation    Dispo: The patient is from: Home              Anticipated d/c is to: SNF with palliative care following.              Anticipated d/c date is: 2 to 3 days              Patient currently not medically stable for discharge.       Consultants:   Palliative care:  Procedures:   Ultrasound-guided paracentesis 05/27/2020--2.8 L clear yellow fluid  removed per IR, Dr. Kathlene Cote  Antimicrobials:   IV Rocephin 05/27/2020>>>   Subjective: Patient laying in bed, confused.  Patient speaking in the telephone which seems like she has brain asking Jesus to forgive her, stating that she cannot take her finger off the trigger.    Objective: Vitals:   05/28/20 0012 05/28/20 0438 05/28/20 0758 05/28/20 1121  BP: 140/68 (!) 117/58 (!) 106/58 (!) 152/79  Pulse: 89 67 77 65  Resp: 18 17 17 18   Temp: 97.6 F (36.4 C)  97.9 F (36.6 C) 98.8 F (37.1 C)  TempSrc: Oral     SpO2: 96% 99% 98% 94%  Weight:      Height:        Intake/Output Summary (Last 24 hours) at 05/28/2020 1322 Last data filed at 05/27/2020 1400 Gross per 24 hour  Intake 120 ml  Output --  Net 120 ml   Filed Weights   05/26/20 1307  Weight: 84.1 kg    Examination:  General exam: Pleasantly confused Respiratory system: Clear to auscultation bilaterally anterior lung fields.  No wheezing.  No crackles.  No rhonchi.  Cardiovascular system: Regular rate rhythm no murmurs rubs or gallops.  No JVD.  1-2+ bilateral lower extremity edema. Gastrointestinal system: Abdomen is soft, mildly distended, nontender to palpation, positive bowel sounds.  No rebound.  No guarding.  Central nervous system: Confused.  Moving extremity spontaneously.Marland Kitchen  No focal neurological deficits. Extremities: Symmetric 5 x 5 power. Skin: No rashes, lesions or ulcers Psychiatry: Judgement and insight appear poor. Mood & affect appropriate.     Data Reviewed: I have personally reviewed following labs and imaging studies  CBC: Recent Labs  Lab 05/26/20 1326 05/27/20 0519 05/28/20 0523  WBC 6.2 5.2 5.5  HGB 11.9* 11.6* 12.3  HCT 34.6* 33.7* 35.3*  MCV 91.1 90.3 88.7  PLT 92* 79* 84*    Basic Metabolic Panel: Recent Labs  Lab 05/23/20 0952 05/24/20 0905 05/26/20 1326 05/27/20 0519 05/28/20 0523  NA 131* 131* 127* 129* 130*  K 3.9 3.9 3.8 3.7 3.6  CL 96* 97* 89* 91* 91*  CO2 28 27  28 27 26   GLUCOSE 144* 115* 142* 117* 125*  BUN <5* 5* 10 12 14   CREATININE 0.72 0.76 0.77 0.89 0.73  CALCIUM 9.0 8.8* 9.0 8.9 9.1  MG  --   --  1.4* 1.6* 1.9    GFR: Estimated Creatinine Clearance: 55.2 mL/min (by C-G formula based on SCr of 0.73 mg/dL).  Liver Function Tests: Recent Labs  Lab 05/22/20 1935 05/26/20 1326 05/28/20 0523  AST 38 44* 38  ALT 15 17 17   ALKPHOS 62 80 64  BILITOT 1.1 2.0* 1.7*  PROT 5.4* 6.2* 5.9*  ALBUMIN 2.7* 3.1* 3.1*    CBG: Recent Labs  Lab 05/23/20 0819 05/23/20 1220 05/23/20 1808 05/23/20 2128 05/24/20 0723  GLUCAP 112* 142* 128* 115* 111*     Recent Results (from the past 240 hour(s))  Urine Culture     Status: Abnormal   Collection Time: 05/19/20 10:36 PM   Specimen: Urine, Clean Catch  Result Value Ref Range Status   Specimen Description   Final    URINE, CLEAN CATCH Performed at [facility], [address], [place], [place] [address]    Special Requests   Final    NONE Performed at [facility], [address] [place], [place] [address]    Culture >=100,000 COLONIES/mL PROTEUS MIRABILIS (A)  Final   Report Status 05/22/2020 FINAL  Final   Organism ID, Bacteria PROTEUS MIRABILIS (A)  Final      Susceptibility   Proteus mirabilis - MIC*    AMPICILLIN <=2 SENSITIVE Sensitive     CEFAZOLIN <=4 SENSITIVE Sensitive     CEFEPIME <=0.12 SENSITIVE Sensitive     CEFTRIAXONE <=0.25 SENSITIVE Sensitive     CIPROFLOXACIN <=0.25 SENSITIVE Sensitive     GENTAMICIN <=1 SENSITIVE Sensitive     IMIPENEM 2 SENSITIVE Sensitive     NITROFURANTOIN 128 RESISTANT Resistant     TRIMETH/SULFA <=20 SENSITIVE Sensitive     AMPICILLIN/SULBACTAM <=2 SENSITIVE Sensitive     PIP/TAZO <=4 SENSITIVE Sensitive     * >=100,000 COLONIES/mL PROTEUS MIRABILIS  SARS CORONAVIRUS 2 (TAT 6-24 HRS) Nasopharyngeal Nasopharyngeal Swab     Status: None   Collection Time: 05/20/20  7:28 AM   Specimen: Nasopharyngeal Swab  Result Value  Ref Range Status   SARS Coronavirus 2 NEGATIVE NEGATIVE Final    Comment: (NOTE) SARS-CoV-2 target nucleic acids are NOT DETECTED.  The SARS-CoV-2 RNA is generally detectable in upper and lower respiratory specimens during the acute phase of infection. Negative results do not preclude SARS-CoV-2 infection, do not rule out co-infections with other pathogens, and should not be used as the sole basis for treatment or other patient management decisions. Negative results must be combined with clinical observations, patient history, and epidemiological information. The expected result  is Negative.  Fact Sheet for Patients: SugarRoll.be  Fact Sheet for Healthcare Providers: https://www.woods-mathews.com/  This test is not yet approved or cleared by the [place] FDA and  has been authorized for detection and/or diagnosis of SARS-CoV-2 by FDA under an Emergency Use Authorization (EUA). This EUA will remain  in effect (meaning this test can be used) for the duration of the COVID-19 declaration under Se ction 564(b)(1) of the Act, 21 U.S.C. section 360bbb-3(b)(1), unless the authorization is terminated or revoked sooner.  Performed at [facility], [place] [address] [place], [place] [address]   Resp Panel by RT-PCR (Flu A&B, Covid) Nasopharyngeal Swab     Status: None   Collection Time: 05/26/20  7:22 PM   Specimen: Nasopharyngeal Swab; Nasopharyngeal(NP) swabs in vial transport medium  Result Value Ref Range Status   SARS Coronavirus 2 by RT PCR NEGATIVE NEGATIVE Final    Comment: (NOTE) SARS-CoV-2 target nucleic acids are NOT DETECTED.  The SARS-CoV-2 RNA is generally detectable in upper respiratory specimens during the acute phase of infection. The lowest concentration of SARS-CoV-2 viral copies this assay can detect is 138 copies/mL. A negative result does not preclude SARS-Cov-2 infection and should not be used as the sole basis  for treatment or other patient management decisions. A negative result may occur with  improper specimen collection/handling, submission of specimen other than nasopharyngeal swab, presence of viral mutation(s) within the areas targeted by this assay, and inadequate number of viral copies(<138 copies/mL). A negative result must be combined with clinical observations, patient history, and epidemiological information. The expected result is Negative.  Fact Sheet for Patients:  EntrepreneurPulse.com.au  Fact Sheet for Healthcare Providers:  IncredibleEmployment.be  This test is no t yet approved or cleared by the [place] FDA and  has been authorized for detection and/or diagnosis of SARS-CoV-2 by FDA under an Emergency Use Authorization (EUA). This EUA will remain  in effect (meaning this test can be used) for the duration of the COVID-19 declaration under Section 564(b)(1) of the Act, 21 U.S.C.section 360bbb-3(b)(1), unless the authorization is terminated  or revoked sooner.       Influenza A by PCR NEGATIVE NEGATIVE Final   Influenza B by PCR NEGATIVE NEGATIVE Final    Comment: (NOTE) The Xpert Xpress SARS-CoV-2/FLU/RSV plus assay is intended as an aid in the diagnosis of influenza from Nasopharyngeal swab specimens and should not be used as a sole basis for treatment. Nasal washings and aspirates are unacceptable for Xpert Xpress SARS-CoV-2/FLU/RSV testing.  Fact Sheet for Patients: EntrepreneurPulse.com.au  Fact Sheet for Healthcare Providers: IncredibleEmployment.be  This test is not yet approved or cleared by the [place] FDA and has been authorized for detection and/or diagnosis of SARS-CoV-2 by FDA under an Emergency Use Authorization (EUA). This EUA will remain in effect (meaning this test can be used) for the duration of the COVID-19 declaration under Section 564(b)(1) of the Act, 21  U.S.C. section 360bbb-3(b)(1), unless the authorization is terminated or revoked.  Performed at [facility], [address] [place], [place] [address]   Body fluid culture     Status: None (Preliminary result)   Collection Time: 05/27/20 11:45 AM   Specimen: PATH Cytology Peritoneal fluid  Result Value Ref Range Status   Specimen Description   Final    PERITONEAL Performed at [facility], [address] [place], [place] [address]    Special Requests   Final    NONE Performed at [facility],  [place], [place] [address]    Gram Stain NO WBC SEEN NO ORGANISMS SEEN   Final   Culture   Final    NO GROWTH < 24 HOURS Performed at [facility], [place] [address] [place], [place] [address]    Report Status PENDING  Incomplete  Culture, blood (Routine X 2) w Reflex to ID Panel     Status: None (Preliminary result)   Collection Time: 05/27/20  3:26 PM   Specimen: BLOOD  Result Value Ref Range Status   Specimen Description BLOOD BLOOD LEFT HAND  Final   Special Requests   Final    BOTTLES DRAWN AEROBIC ONLY Blood Culture results may not be optimal due to an inadequate volume of blood received in culture bottles   Culture   Final    NO GROWTH < 24 HOURS Performed at [facility], [address], [place], [place] [address]    Report Status PENDING  Incomplete  Culture, blood (Routine X 2) w Reflex to ID Panel     Status: None (Preliminary result)   Collection Time: 05/27/20  3:27 PM   Specimen: BLOOD  Result Value Ref Range Status   Specimen Description BLOOD LEFT ANTECUBITAL  Final   Special Requests   Final    BOTTLES DRAWN AEROBIC ONLY Blood Culture results may not be optimal due to an inadequate volume of blood received in culture bottles   Culture   Final    NO GROWTH < 24 HOURS Performed at [facility], [address] [place], [place] [address]    Report Status PENDING  Incomplete          Radiology Studies: DG Chest 2 View  Result Date: 05/26/2020 CLINICAL DATA:  Low blood pressure EXAM: CHEST - 2 VIEW COMPARISON:  01/30/2018 FINDINGS: Low lung volumes. No focal opacity or pleural effusion. Stable cardiomediastinal silhouette. Stable nodularity projecting over right first rib end. No pneumothorax. IMPRESSION: No active cardiopulmonary disease. Electronically Signed   By: Donavan Foil M.D.   On: 05/26/2020 17:23   CT Head Wo Contrast  Result Date: 05/26/2020 CLINICAL DATA:  Mental status change.  Confusion. EXAM: CT HEAD WITHOUT CONTRAST TECHNIQUE: Contiguous axial images were obtained from the base of the skull through the vertex without intravenous contrast. COMPARISON:  CT head February 18, 2010. FINDINGS: Brain: No evidence of acute large vascular territory infarction, hemorrhage, hydrocephalus, extra-axial collection or mass lesion/mass effect. Vascular: No hyperdense vessel identified. Calcific atherosclerosis. Skull: No acute fracture. Sinuses/Orbits: Visualized sinuses are clear. Unremarkable visualized orbits. Other: No mastoid effusions. IMPRESSION: No evidence of acute intracranial abnormality. Electronically Signed   By: Margaretha Sheffield MD   On: 05/26/2020 17:01   US Paracentesis  Result Date: 05/27/2020 INDICATION: Cirrhosis and ascites. EXAM: ULTRASOUND GUIDED PARACENTESIS MEDICATIONS: None. COMPLICATIONS: None immediate. PROCEDURE: Informed written consent was obtained from the patient after a discussion of the risks, benefits and alternatives to treatment. A timeout was performed prior to the initiation of the procedure. Initial ultrasound was performed to localize ascites. The right lower abdomen was prepped and draped in the usual sterile fashion. 1% lidocaine was used for local anesthesia. Following this, a 6 Fr Safe-T-Centesis catheter was introduced. An ultrasound image was saved for documentation purposes. The paracentesis was performed. The catheter was  removed and a dressing was applied. The patient tolerated the procedure well without immediate post procedural complication. FINDINGS: A total of approximately 2.8 L of clear, yellow fluid was removed.  Samples were sent to the laboratory as requested by the clinical team. IMPRESSION: Successful ultrasound-guided paracentesis yielding 2.8 liters of peritoneal fluid. Electronically Signed   By: Aletta Edouard M.D.   On: 05/27/2020 15:56   US ABDOMEN LIMITED RUQ (LIVER/GB)  Result Date: 05/26/2020 CLINICAL DATA:  Bilirubinemia. EXAM: ULTRASOUND ABDOMEN LIMITED RIGHT UPPER QUADRANT COMPARISON:  None. FINDINGS: Gallbladder: Shadowing echogenic gallstones are seen within the gallbladder lumen. The largest measures approximately 1.1 cm. Heterogeneous gallbladder sludge is also noted. The gallbladder wall measures 12.5 mm in thickness. No sonographic Murphy sign noted by sonographer. Common bile duct: Diameter: 4.1 mm Liver: The liver is nodular in contour and demonstrates coarse echotexture. No focal lesion identified. Within normal limits in parenchymal echogenicity. Portal vein is patent on color Doppler imaging with normal direction of blood flow towards the liver. Other: Free fluid is seen within the abdomen. IMPRESSION: 1. Cholelithiasis and gallbladder sludge, without evidence of acute cholecystitis. 2. Cirrhotic liver with associated ascites. Electronically Signed   By: Virgina Norfolk M.D.   On: 05/26/2020 18:13        Scheduled Meds: . furosemide  20 mg Oral Daily  . lactulose  20 g Oral Once  . lactulose  30 g Oral BID  . nadolol  10 mg Oral Daily  . rifaximin  550 mg Oral BID  . spironolactone  100 mg Oral Daily   Continuous Infusions: . cefTRIAXone (ROCEPHIN)  IV 2 g (05/27/20 2103)     LOS: 0 days    Time spent: 40 minutes    Irine Seal, MD [facility]   To contact the attending provider between 7A-7P or the covering provider during after hours 7P-7A, please log  into the web site [email or web address] and access using universal [place] password for that web site. If you do not have the password, please call the hospital operator.  05/28/2020, 1:22 PM

## 2020-05-29 DIAGNOSIS — R4182 Altered mental status, unspecified: Secondary | ICD-10-CM | POA: Diagnosis present

## 2020-05-29 DIAGNOSIS — D696 Thrombocytopenia, unspecified: Secondary | ICD-10-CM | POA: Diagnosis not present

## 2020-05-29 DIAGNOSIS — M1990 Unspecified osteoarthritis, unspecified site: Secondary | ICD-10-CM | POA: Diagnosis present

## 2020-05-29 DIAGNOSIS — K8020 Calculus of gallbladder without cholecystitis without obstruction: Secondary | ICD-10-CM | POA: Diagnosis not present

## 2020-05-29 DIAGNOSIS — I8510 Secondary esophageal varices without bleeding: Secondary | ICD-10-CM | POA: Diagnosis present

## 2020-05-29 DIAGNOSIS — I361 Nonrheumatic tricuspid (valve) insufficiency: Secondary | ICD-10-CM | POA: Diagnosis not present

## 2020-05-29 DIAGNOSIS — I10 Essential (primary) hypertension: Secondary | ICD-10-CM | POA: Diagnosis present

## 2020-05-29 DIAGNOSIS — E871 Hypo-osmolality and hyponatremia: Secondary | ICD-10-CM | POA: Diagnosis not present

## 2020-05-29 DIAGNOSIS — K7581 Nonalcoholic steatohepatitis (NASH): Secondary | ICD-10-CM | POA: Diagnosis present

## 2020-05-29 DIAGNOSIS — K7460 Unspecified cirrhosis of liver: Secondary | ICD-10-CM | POA: Diagnosis present

## 2020-05-29 DIAGNOSIS — I081 Rheumatic disorders of both mitral and tricuspid valves: Secondary | ICD-10-CM | POA: Diagnosis present

## 2020-05-29 DIAGNOSIS — R000 Tachycardia, unspecified: Secondary | ICD-10-CM | POA: Diagnosis not present

## 2020-05-29 DIAGNOSIS — G9341 Metabolic encephalopathy: Secondary | ICD-10-CM | POA: Diagnosis not present

## 2020-05-29 DIAGNOSIS — Z86010 Personal history of colonic polyps: Secondary | ICD-10-CM | POA: Diagnosis not present

## 2020-05-29 DIAGNOSIS — R1030 Lower abdominal pain, unspecified: Secondary | ICD-10-CM | POA: Diagnosis present

## 2020-05-29 DIAGNOSIS — E118 Type 2 diabetes mellitus with unspecified complications: Secondary | ICD-10-CM | POA: Diagnosis present

## 2020-05-29 DIAGNOSIS — K8010 Calculus of gallbladder with chronic cholecystitis without obstruction: Secondary | ICD-10-CM | POA: Diagnosis present

## 2020-05-29 DIAGNOSIS — E785 Hyperlipidemia, unspecified: Secondary | ICD-10-CM | POA: Diagnosis present

## 2020-05-29 DIAGNOSIS — K7290 Hepatic failure, unspecified without coma: Secondary | ICD-10-CM | POA: Diagnosis present

## 2020-05-29 DIAGNOSIS — R188 Other ascites: Secondary | ICD-10-CM | POA: Diagnosis not present

## 2020-05-29 DIAGNOSIS — Z87891 Personal history of nicotine dependence: Secondary | ICD-10-CM | POA: Diagnosis not present

## 2020-05-29 DIAGNOSIS — Z87440 Personal history of urinary (tract) infections: Secondary | ICD-10-CM | POA: Diagnosis not present

## 2020-05-29 DIAGNOSIS — I472 Ventricular tachycardia: Secondary | ICD-10-CM | POA: Diagnosis not present

## 2020-05-29 DIAGNOSIS — D6959 Other secondary thrombocytopenia: Secondary | ICD-10-CM | POA: Diagnosis present

## 2020-05-29 DIAGNOSIS — Z20822 Contact with and (suspected) exposure to covid-19: Secondary | ICD-10-CM | POA: Diagnosis present

## 2020-05-29 DIAGNOSIS — Z90710 Acquired absence of both cervix and uterus: Secondary | ICD-10-CM | POA: Diagnosis not present

## 2020-05-29 DIAGNOSIS — K219 Gastro-esophageal reflux disease without esophagitis: Secondary | ICD-10-CM | POA: Diagnosis present

## 2020-05-29 DIAGNOSIS — J45909 Unspecified asthma, uncomplicated: Secondary | ICD-10-CM | POA: Diagnosis present

## 2020-05-29 LAB — COMPREHENSIVE METABOLIC PANEL
ALT: 15 U/L (ref 0–44)
AST: 32 U/L (ref 15–41)
Albumin: 2.8 g/dL — ABNORMAL LOW (ref 3.5–5.0)
Alkaline Phosphatase: 57 U/L (ref 38–126)
Anion gap: 10 (ref 5–15)
BUN: 20 mg/dL (ref 8–23)
CO2: 29 mmol/L (ref 22–32)
Calcium: 9 mg/dL (ref 8.9–10.3)
Chloride: 94 mmol/L — ABNORMAL LOW (ref 98–111)
Creatinine, Ser: 0.97 mg/dL (ref 0.44–1.00)
GFR, Estimated: 59 mL/min — ABNORMAL LOW (ref >60)
Glucose, Bld: 116 mg/dL — ABNORMAL HIGH (ref 70–99)
Potassium: 3.8 mmol/L (ref 3.5–5.1)
Sodium: 133 mmol/L — ABNORMAL LOW (ref 135–145)
Total Bilirubin: 1.5 mg/dL — ABNORMAL HIGH (ref 0.3–1.2)
Total Protein: 5.3 g/dL — ABNORMAL LOW (ref 6.5–8.1)

## 2020-05-29 LAB — CBC
HCT: 30.7 % — ABNORMAL LOW (ref 36.0–46.0)
Hemoglobin: 10.9 g/dL — ABNORMAL LOW (ref 12.0–15.0)
MCH: 31.5 pg (ref 26.0–34.0)
MCHC: 35.5 g/dL (ref 30.0–36.0)
MCV: 88.7 fL (ref 80.0–100.0)
Platelets: 71 10*3/uL — ABNORMAL LOW (ref 150–400)
RBC: 3.46 MIL/uL — ABNORMAL LOW (ref 3.87–5.11)
RDW: 15.3 % (ref 11.5–15.5)
WBC: 4.8 10*3/uL (ref 4.0–10.5)
nRBC: 0 % (ref 0.0–0.2)

## 2020-05-29 LAB — MAGNESIUM: Magnesium: 1.7 mg/dL (ref 1.7–2.4)

## 2020-05-29 MED ORDER — MAGNESIUM SULFATE 4 GM/100ML IV SOLN
4.0000 g | Freq: Once | INTRAVENOUS | Status: AC
  Administered 2020-05-29: 4 g via INTRAVENOUS
  Filled 2020-05-29: qty 100

## 2020-05-29 MED ORDER — LORAZEPAM 2 MG/ML IJ SOLN
0.5000 mg | Freq: Once | INTRAMUSCULAR | Status: AC
  Administered 2020-05-29: 0.5 mg via INTRAVENOUS
  Filled 2020-05-29: qty 1

## 2020-05-29 MED ORDER — METOPROLOL TARTRATE 5 MG/5ML IV SOLN: 5.0000 mg | INTRAVENOUS | Status: DC | PRN

## 2020-05-29 MED ORDER — LACTULOSE 10 GM/15ML PO SOLN
20.0000 g | Freq: Two times a day (BID) | ORAL | Status: DC
  Administered 2020-05-29 – 2020-05-31 (×2): 20 g via ORAL
  Filled 2020-05-29 (×4): qty 30

## 2020-05-29 NOTE — Progress Notes (Addendum)
PROGRESS NOTE    Jennifer [name]  QQV:[id number] DOB: 12-18-1940 DOA: 05/26/2020 PCP: Mar Daring, PA-C    Chief Complaint  Patient presents with  . Altered Mental Status    Brief Narrative:  HPI Per Dr. Karilyn Cota is a 80 y.o. female with medical history significant for nonalcoholic liver cirrhosis, esophageal varices s/p banding, chronic hyponatremia, thrombocytopenia, diet-controlled type 2 diabetes, hypertension, and asthma who presents to the ED for evaluation of altered mental status.  Patient was recently admitted 05/19/2020-05/24/2020 for acute on chronic hyponatremia.  Initial sodium was 107 compared to baseline around 127.  Nephrology were consulted and she was managed with 3% saline and tolvaptan.  On discharge, sodium was 131.  Patient was also found to have Proteus mirabilis UTI for which she completed 5 days of cephalosporin antibiotic therapy.  Patient returns with complaint of shakes of her upper extremities and feeling cold.  She has noticed increased swelling of her abdomen and bilateral lower extremities.  Reports good urine output without dysuria.  She says her last bowel movement was earlier today.  She is awake but keeps her eyes mostly closed.  She is oriented to self, place, and year but history is otherwise limited as she keeps repeating "God help me."  ED Course:  Initial vitals showed BP 105/46, pulse 58, RR 16, temp 97.8 F, SPO2 97% on room air.  Labs are notable for sodium 127, potassium 3.8, chloride 89, bicarb 28, BUN 10, creatinine 0.77, magnesium 1.4, serum glucose 142, AST 44, ALT 17, alk phos 80, total bilirubin 2.0, WBC 6.2, hemoglobin 11.9, platelets 92,000, ammonia 14, BNP 187.6, TSH 1.526.  SARS-CoV-2 PCR pending was ordered and pending.  CT head without contrast is negative for evidence of acute intracranial abnormality.  2 view chest x-ray shows low lung volumes without focal consolidation, effusion, or edema.  RUQ  ultrasound shows cholelithiasis and gallbladder sludge without evidence of acute cholecystitis.  Cirrhotic liver with associated ascites noted.  Patient was given 500 cc normal saline, IV magnesium 2 g.  The hospitalist service was consulted to admit for further evaluation and management.  Assessment & Plan:   Principal Problem:   Altered mental status Active Problems:   Type 2 diabetes mellitus with unspecified complications (HCC)   Calculus of gallbladder   Thrombocytopenia (HCC)   Liver cirrhosis secondary to NASH (HCC)   Hyponatremia   Acute metabolic encephalopathy   Ascites   Hypomagnesemia   AMS (altered mental status)  1 acute metabolic encephalopathy/?  Hepatic encephalopathy/NASH cirrhosis with ascites/esophageal varices status post banding Patient presented with reported confusion some transient improvement but still confused which seems to be waxing and waning. Patient with some clinical improvement after adjustment of lactulose.  Patient alert and oriented to self place and time..   Patient noted on admission to have some repetitive speech and tremors in the upper extremities.   Patient on presentation noted to have abdominal distention and lower extremity edema with some ascites.   Ammonia levels within normal limits however concern for possible hepatic encephalopathy. Patient's acute metabolic encephalopathy likely still secondary to hepatic encephalopathy.  Patient with multiple loose stools after increase of lactulose to 30 g twice daily.  Patient with clinical improvement.  Decrease lactulose to 20 g twice daily and titrate for 3-5 bowel movements per day.  Continue Xifaxan 550 mg twice daily, Lasix, spironolactone, nadolol.  Patient's primary gastroenterologist, Dr. Marius Ditch informed of patient's admission who did a social visit( 05/28/2020).  Discontinue IV Rocephin.  Supportive care.  Follow.  2.  Chronic hyponatremia Likely secondary to liver cirrhosis with baseline  sodium approximately 130.  Currently at baseline.  Sodium at 133.  Follow-up.  3.  Hypomagnesemia Magnesium at 1.7.  Magnesium sulfate 4 g IV x1.  May need to place on oral magnesium supplementation as patient on lactulose.   4.  Chronic thrombocytopenia Likely secondary to liver cirrhosis.  No bleeding.  Follow.  5.  Diet-controlled type 2 diabetes mellitus Hemoglobin A1c 5.7 05/19/2020.  Blood glucose level of 116 this morning on labs.  Outpatient follow-up.   6. chronic cholelithiasis Currently asymptomatic.  Outpatient follow-up with general surgery.  7.  Lower abdominal pain Status post recent treatment for UTI.  Repeat UA bland.  Urine cultures pending.  Patient afebrile.  No further abdominal pain.  Gram stain and cultures from paracentesis with no growth to date.  Discontinue IV Rocephin.  Follow.  8.  Asymptomatic nonsustained V. tach Patient noted by RN yesterday to have some concern for nonsustained V. tach.  Patient with no further episodes.  Cardiac enzymes were elevated however flat and in doubt ACS.  Patient with no chest pain.  2D echo pending.  Continue home regimen nadolol.  IV Lopressor as needed heart rate > 130  9.  Prognosis Patient with NASH, esophageal varices status post recent banding, presenting with concern for hepatic encephalopathy.  History of chronic hyponatremia.  Patient with recent hospitalization 05/19/2020-05/24/2020 with acute on chronic hyponatremia treated with 3% hypertonic saline and tolvaptan, status post full course of antibiotic treatment for UTI presenting back within a 2-week.  With abdominal ascites, concern for hepatic encephalopathy.  Patient with poor prognosis.  Patient seems to relay that procedure was recommended by GI which she cannot quite remember however stated that she would rather die than have any more further significant invasive procedures.  Palliative care consulted and recommending outpatient follow-up with palliative care on  discharge at SNF.     DVT prophylaxis: SCDs Code Status: Full Family Communication: Updated patient and daughter Jennifer [name] at bedside.  Disposition:   Status is: Observation    Dispo: The patient is from: Home              Anticipated d/c is to: SNF with palliative care following.              Anticipated d/c date is: 05/31/2020              Patient currently not medically stable for discharge.       Consultants:   Palliative care:  Procedures:   Ultrasound-guided paracentesis 05/27/2020--2.8 L clear yellow fluid removed per IR, Dr. Kathlene Cote  Antimicrobials:   IV Rocephin 05/27/2020>>> 05/29/2020   Subjective: Patient sitting up in chair eating lunch.  Daughter at bedside.  Patient alert today, answering questions appropriately.  Stated had multiple loose stools last night.   Objective: Vitals:   05/29/20 0402 05/29/20 0500 05/29/20 0800 05/29/20 1200  BP: (!) 113/56  (!) 100/53 114/62  Pulse: 73  77 81  Resp: _0 Temp: 98.3 F (36.8 C)  97.7 F (36.5 C) 98 F (36.7 C)  TempSrc:   Oral   SpO2: 94%  95% 95%  Weight:  78.6 kg    Height:        Intake/Output Summary (Last 24 hours) at 05/29/2020 1420 Last data filed at 05/29/2020 0328 Gross per 24 hour  Intake 200 ml  Output -  Net 200 ml   Filed Weights   05/26/20 1307 05/29/20 0500  Weight: 84.1 kg 78.6 kg    Examination:  General exam: Alert.  NAD. Respiratory system: Lungs clear to auscultation bilaterally.  No wheezes, no crackles, no rhonchi.  Normal respiratory effort.  Cardiovascular system: RRR no murmurs rubs or gallops.  No JVD.  1-2+ bilateral lower extremity edema. Gastrointestinal system: Abdomen is soft, mildly distended, nontender to palpation, positive bowel sounds.  No rebound.  No guarding.  Central nervous system: Alert and oriented x3.  Moving extremity spontaneously.. No focal neurological deficits. Extremities: Symmetric 5 x 5 power. Skin: No rashes, lesions or  ulcers Psychiatry: Judgement and insight appear poor. Mood & affect appropriate.     Data Reviewed: I have personally reviewed following labs and imaging studies  CBC: Recent Labs  Lab 05/26/20 1326 05/27/20 0519 05/28/20 0523 05/29/20 0457  WBC 6.2 5.2 5.5 4.8  HGB 11.9* 11.6* 12.3 10.9*  HCT 34.6* 33.7* 35.3* 30.7*  MCV 91.1 90.3 88.7 88.7  PLT 92* 79* 84* 71*    Basic Metabolic Panel: Recent Labs  Lab 05/26/20 1326 05/27/20 0519 05/28/20 0523 05/28/20 1557 05/29/20 0457  NA 127* 129* 130* 132* 133*  K 3.8 3.7 3.6 3.5 3.8  CL 89* 91* 91* 91* 94*  CO2 _0 GLUCOSE 142* 117* 125* 139* 116*  BUN _1 CREATININE 0.77 0.89 0.73 0.80 0.97  CALCIUM 9.0 8.9 9.1 9.1 9.0  MG 1.4* 1.6* 1.9 2.0 1.7    GFR: Estimated Creatinine Clearance: 43.9 mL/min (by C-G formula based on SCr of 0.97 mg/dL).  Liver Function Tests: Recent Labs  Lab 05/22/20 1935 05/26/20 1326 05/28/20 0523 05/29/20 0457  AST 38 44* 38 32  ALT _2 ALKPHOS 62 80 64 57  BILITOT 1.1 2.0* 1.7* 1.5*  PROT 5.4* 6.2* 5.9* 5.3*  ALBUMIN 2.7* 3.1* 3.1* 2.8*    CBG: Recent Labs  Lab 05/23/20 1220 05/23/20 1808 05/23/20 2128 05/24/20 0723 05/28/20 1508  GLUCAP 142* 128* 115* 111* 127*     Recent Results (from the past 240 hour(s))  Urine Culture     Status: Abnormal   Collection Time: 05/19/20 10:36 PM   Specimen: Urine, Clean Catch  Result Value Ref Range Status   Specimen Description   Final    URINE, CLEAN CATCH Performed at [facility], [address] [place], [place] [address]    Special Requests   Final    NONE Performed at [facility], [address] [place], [place] [address]    Culture >=100,000 COLONIES/mL PROTEUS MIRABILIS (A)  Final   Report Status 05/22/2020 FINAL  Final   Organism ID, Bacteria PROTEUS MIRABILIS (A)  Final      Susceptibility   Proteus mirabilis - MIC*    AMPICILLIN <=2 SENSITIVE Sensitive      CEFAZOLIN <=4 SENSITIVE Sensitive     CEFEPIME <=0.12 SENSITIVE Sensitive     CEFTRIAXONE <=0.25 SENSITIVE Sensitive     CIPROFLOXACIN <=0.25 SENSITIVE Sensitive     GENTAMICIN <=1 SENSITIVE Sensitive     IMIPENEM 2 SENSITIVE Sensitive     NITROFURANTOIN 128 RESISTANT Resistant     TRIMETH/SULFA <=20 SENSITIVE Sensitive     AMPICILLIN/SULBACTAM <=2 SENSITIVE Sensitive     PIP/TAZO <=4 SENSITIVE Sensitive     * >=100,000 COLONIES/mL PROTEUS MIRABILIS  SARS CORONAVIRUS 2 (TAT 6-24 HRS) Nasopharyngeal Nasopharyngeal Swab  Status: None   Collection Time: 05/20/20  7:28 AM   Specimen: Nasopharyngeal Swab  Result Value Ref Range Status   SARS Coronavirus 2 NEGATIVE NEGATIVE Final    Comment: (NOTE) SARS-CoV-2 target nucleic acids are NOT DETECTED.  The SARS-CoV-2 RNA is generally detectable in upper and lower respiratory specimens during the acute phase of infection. Negative results do not preclude SARS-CoV-2 infection, do not rule out co-infections with other pathogens, and should not be used as the sole basis for treatment or other patient management decisions. Negative results must be combined with clinical observations, patient history, and epidemiological information. The expected result is Negative.  Fact Sheet for Patients: SugarRoll.be  Fact Sheet for Healthcare Providers: https://www.woods-mathews.com/  This test is not yet approved or cleared by the [place] FDA and  has been authorized for detection and/or diagnosis of SARS-CoV-2 by FDA under an Emergency Use Authorization (EUA). This EUA will remain  in effect (meaning this test can be used) for the duration of the COVID-19 declaration under Se ction 564(b)(1) of the Act, 21 U.S.C. section 360bbb-3(b)(1), unless the authorization is terminated or revoked sooner.  Performed at [facility], [place] [address], [place], [place] [address]   Resp Panel by RT-PCR (Flu  A&B, Covid) Nasopharyngeal Swab     Status: None   Collection Time: 05/26/20  7:22 PM   Specimen: Nasopharyngeal Swab; Nasopharyngeal(NP) swabs in vial transport medium  Result Value Ref Range Status   SARS Coronavirus 2 by RT PCR NEGATIVE NEGATIVE Final    Comment: (NOTE) SARS-CoV-2 target nucleic acids are NOT DETECTED.  The SARS-CoV-2 RNA is generally detectable in upper respiratory specimens during the acute phase of infection. The lowest concentration of SARS-CoV-2 viral copies this assay can detect is 138 copies/mL. A negative result does not preclude SARS-Cov-2 infection and should not be used as the sole basis for treatment or other patient management decisions. A negative result may occur with  improper specimen collection/handling, submission of specimen other than nasopharyngeal swab, presence of viral mutation(s) within the areas targeted by this assay, and inadequate number of viral copies(<138 copies/mL). A negative result must be combined with clinical observations, patient history, and epidemiological information. The expected result is Negative.  Fact Sheet for Patients:  EntrepreneurPulse.com.au  Fact Sheet for Healthcare Providers:  IncredibleEmployment.be  This test is no t yet approved or cleared by the [place] FDA and  has been authorized for detection and/or diagnosis of SARS-CoV-2 by FDA under an Emergency Use Authorization (EUA). This EUA will remain  in effect (meaning this test can be used) for the duration of the COVID-19 declaration under Section 564(b)(1) of the Act, 21 U.S.C.section 360bbb-3(b)(1), unless the authorization is terminated  or revoked sooner.       Influenza A by PCR NEGATIVE NEGATIVE Final   Influenza B by PCR NEGATIVE NEGATIVE Final    Comment: (NOTE) The Xpert Xpress SARS-CoV-2/FLU/RSV plus assay is intended as an aid in the diagnosis of influenza from Nasopharyngeal swab specimens  and should not be used as a sole basis for treatment. Nasal washings and aspirates are unacceptable for Xpert Xpress SARS-CoV-2/FLU/RSV testing.  Fact Sheet for Patients: EntrepreneurPulse.com.au  Fact Sheet for Healthcare Providers: IncredibleEmployment.be  This test is not yet approved or cleared by the [place] FDA and has been authorized for detection and/or diagnosis of SARS-CoV-2 by FDA under an Emergency Use Authorization (EUA). This EUA will remain in effect (meaning this test can be used) for the duration  of the COVID-19 declaration under Section 564(b)(1) of the Act, 21 U.S.C. section 360bbb-3(b)(1), unless the authorization is terminated or revoked.  Performed at [facility], [address] [place], [place] [address]   Body fluid culture     Status: None (Preliminary result)   Collection Time: 05/27/20 11:45 AM   Specimen: PATH Cytology Peritoneal fluid  Result Value Ref Range Status   Specimen Description   Final    PERITONEAL Performed at [facility], [address], [place], [place] [address]    Special Requests   Final    NONE Performed at [facility], [place], [place] [address]    Gram Stain NO WBC SEEN NO ORGANISMS SEEN   Final   Culture   Final    NO GROWTH 2 DAYS Performed at [facility], [place] [address] [place], [place] [address]    Report Status PENDING  Incomplete  Culture, blood (Routine X 2) w Reflex to ID Panel     Status: None (Preliminary result)   Collection Time: 05/27/20  3:26 PM   Specimen: BLOOD  Result Value Ref Range Status   Specimen Description BLOOD BLOOD LEFT HAND  Final   Special Requests   Final    BOTTLES DRAWN AEROBIC ONLY Blood Culture results may not be optimal due to an inadequate volume of blood received in culture bottles   Culture   Final    NO GROWTH 2 DAYS Performed at [facility], [address],  [place], [place] [address]    Report Status PENDING  Incomplete  Culture, blood (Routine X 2) w Reflex to ID Panel     Status: None (Preliminary result)   Collection Time: 05/27/20  3:27 PM   Specimen: BLOOD  Result Value Ref Range Status   Specimen Description BLOOD LEFT ANTECUBITAL  Final   Special Requests   Final    BOTTLES DRAWN AEROBIC ONLY Blood Culture results may not be optimal due to an inadequate volume of blood received in culture bottles   Culture   Final    NO GROWTH 2 DAYS Performed at [facility], [address] [place], [place] [address]    Report Status PENDING  Incomplete         Radiology Studies: No results found.      Scheduled Meds: . furosemide  20 mg Oral Daily  . lactulose  30 g Oral BID  . nadolol  10 mg Oral Daily  . rifaximin  550 mg Oral BID  . spironolactone  100 mg Oral Daily   Continuous Infusions: . cefTRIAXone (ROCEPHIN)  IV 2 g (05/28/20 2028)     LOS: 0 days    Time spent: 40 minutes    Irine Seal, MD [facility]   To contact the attending provider between 7A-7P or the covering provider during after hours 7P-7A, please log into the web site [email or web address] and access using universal [place] password for that web site. If you do not have the password, please call the hospital operator.  05/29/2020, 2:20 PM

## 2020-05-30 ENCOUNTER — Inpatient Hospital Stay (HOSPITAL_COMMUNITY)
Admit: 2020-05-30 | Discharge: 2020-05-30 | Disposition: A | Discharge status: Home / Self Care | Payer: Medicare HMO | Attending: Internal Medicine | Admitting: Internal Medicine

## 2020-05-30 DIAGNOSIS — I361 Nonrheumatic tricuspid (valve) insufficiency: Secondary | ICD-10-CM | POA: Diagnosis not present

## 2020-05-30 DIAGNOSIS — I472 Ventricular tachycardia: Secondary | ICD-10-CM | POA: Diagnosis not present

## 2020-05-30 LAB — COMPREHENSIVE METABOLIC PANEL
ALT: 16 U/L (ref 0–44)
AST: 35 U/L (ref 15–41)
Albumin: 3 g/dL — ABNORMAL LOW (ref 3.5–5.0)
Alkaline Phosphatase: 64 U/L (ref 38–126)
Anion gap: 11 (ref 5–15)
BUN: 23 mg/dL (ref 8–23)
CO2: 31 mmol/L (ref 22–32)
Calcium: 9.2 mg/dL (ref 8.9–10.3)
Chloride: 91 mmol/L — ABNORMAL LOW (ref 98–111)
Creatinine, Ser: 0.9 mg/dL (ref 0.44–1.00)
GFR, Estimated: >60 mL/min (ref >60)
Glucose, Bld: 101 mg/dL — ABNORMAL HIGH (ref 70–99)
Potassium: 3.5 mmol/L (ref 3.5–5.1)
Sodium: 133 mmol/L — ABNORMAL LOW (ref 135–145)
Total Bilirubin: 1.5 mg/dL — ABNORMAL HIGH (ref 0.3–1.2)
Total Protein: 5.7 g/dL — ABNORMAL LOW (ref 6.5–8.1)

## 2020-05-30 LAB — ECHOCARDIOGRAM COMPLETE
AR max vel: 1.42 cm2
AV Peak grad: 7 mmHg
Ao pk vel: 1.32 m/s
Area-P 1/2: 5.02 cm2
Height: 61 in
S' Lateral: 2.91 cm
Weight: 2772.5 oz

## 2020-05-30 LAB — BODY FLUID CULTURE
Culture: NO GROWTH
Gram Stain: NONE SEEN

## 2020-05-30 LAB — MAGNESIUM: Magnesium: 2.2 mg/dL (ref 1.7–2.4)

## 2020-05-30 LAB — SARS CORONAVIRUS 2 (TAT 6-24 HRS): SARS Coronavirus 2: NEGATIVE

## 2020-05-30 MED ORDER — POTASSIUM CHLORIDE CRYS ER 20 MEQ PO TBCR
40.0000 meq | EXTENDED_RELEASE_TABLET | Freq: Once | ORAL | Status: AC
  Administered 2020-05-30: 40 meq via ORAL
  Filled 2020-05-30: qty 2

## 2020-05-30 MED ORDER — ZINC OXIDE 40 % EX OINT
TOPICAL_OINTMENT | CUTANEOUS | Status: DC | PRN
  Filled 2020-05-30: qty 113

## 2020-05-30 NOTE — Progress Notes (Signed)
Received call from unit nurse, patient requested a bible. Delivered the bible, had a chance to visit with patient. She expects to be released tomorrow to an extended care facility to get her back to where she can care for herself. Patient was very appreciative receiving a bible to carry with her. She asked for her name and date to be written in it. Very pleasant visit.

## 2020-05-30 NOTE — TOC Initial Note (Signed)
Transition of Care [facility]) - Initial/Assessment Note    Patient Details  Name: Jennifer [name] MRN: [id number] Date of Birth: 1940-10-28  [facility] CM/SW Contact:    Boris Sharper, LCSW Phone Number: 05/30/2020, 12:04 PM  Clinical Narrative:                 CSW received notice that the pt's daughter Jennifer [name] wanted to discuss discharge plans for her mother. CSW reached out to Jennifer [name] and notified her of the PT recommendations and the process. Jennifer [name] was agreeable to SNF search and stated that her mother does not want to go but due to her weakness, increased confusion and pt's son who lives with her has a disability as well this is the best option for her.  CSW completed FL2 and PASRR and faxed to surrounding facilities.   Expected Discharge Plan: Skilled Nursing Facility Barriers to Discharge: Continued Medical Work up   Patient Goals and CMS Choice Patient states their goals for this hospitalization and ongoing recovery are:: Pt does not want to dgo to SNF but due to increased weakm=ness and confusion family feels it is the best option CMS Medicare.gov Compare Post Acute Care list provided to:: Patient Represenative (must comment) Choice offered to / list presented to : Adult Children  Expected Discharge Plan and Services Expected Discharge Plan: [place] In-house Referral: Clinical Social Work     Living arrangements for the past 2 months: Single Family Home Expected Discharge Date: 05/31/20                                    Prior Living Arrangements/Services Living arrangements for the past 2 months: Single Family Home Lives with:: Adult Children Patient language and need for interpreter reviewed:: Yes        Need for Family Participation in Patient Care: Yes (Comment) (increased confusion) Care giver support system in place?: Yes (comment) (daughter)   Criminal Activity/Legal Involvement Pertinent to Current Situation/Hospitalization: No -  Comment as needed  Activities of Daily Living Home Assistive Devices/Equipment: Environmental consultant (specify type) ADL Screening (condition at time of admission) Patient's cognitive ability adequate to safely complete daily activities?: Yes Is the patient deaf or have difficulty hearing?: No Does the patient have difficulty seeing, even when wearing glasses/contacts?: No Does the patient have difficulty concentrating, remembering, or making decisions?: Yes Patient able to express need for assistance with ADLs?: Yes Does the patient have difficulty dressing or bathing?: Yes Independently performs ADLs?: Yes (appropriate for developmental age) Does the patient have difficulty walking or climbing stairs?: Yes Weakness of Legs: Both Weakness of Arms/Hands: None  Permission Sought/Granted   Permission granted to share information with : Yes, Verbal Permission Granted  Share Information with NAME: Jennifer [name]     Permission granted to share info w Relationship: daughter  Permission granted to share info w Contact Information: [phone]  Emotional Assessment Appearance:: Other (Comment Required (unable to assess) Attitude/Demeanor/Rapport: Unable to Assess Affect (typically observed): Unable to Assess Orientation: : Oriented to Self,Fluctuating Orientation (Suspected and/or reported Sundowners),Oriented to Place,Oriented to  Time Alcohol / Substance Use: Not Applicable Psych Involvement: No (comment)  Admission diagnosis:  Hypochloremia [E87.8] Hypomagnesemia [E83.42] Bilirubinemia [E80.6] Hyponatremia [E87.1] Altered mental state [R41.82] Ascites [R18.8] Altered mental status, unspecified altered mental status type [R41.82] AMS (altered mental status) [R41.82] Patient Active Problem List   Diagnosis Date Noted  . AMS (altered mental status)  05/29/2020  . Acute metabolic encephalopathy   . Ascites   . Hypomagnesemia   . Altered mental status 05/26/2020  . Hyponatremia 05/19/2020  . Chronic  hyponatremia 05/19/2020  . Essential hypertension, benign 02/04/2018  . Iron deficiency 02/04/2018  . Pelvic fracture ([place]) 01/31/2018  . Multiple gastric polyps   . Hx of colonic polyps   . Acute upper GI bleed 07/12/2017  . Liver cirrhosis secondary to NASH ([place])   . Carpal tunnel syndrome on right 09/28/2016  . Obesity (BMI 30.0-34.9) 09/20/2016  . Upper respiratory infection 03/17/2016  . Thrombocytopenia ([place]) 12/29/2015  . Benign neoplasm of ascending colon   . Benign neoplasm of transverse colon   . Benign neoplasm of sigmoid colon   . Allergic rhinitis 10/14/2014  . Airway hyperreactivity 10/14/2014  . Type 2 diabetes mellitus with unspecified complications ([place]) 02/77/4128  . Calculus of gallbladder 10/14/2014  . Esophagitis, reflux 10/14/2014  . Vitamin D deficiency 10/14/2014   PCP:  Mar Daring, PA-C Pharmacy:   [place], [place] - [place] [address] Phone: [phone] Fax: [phone]  [facility] [place], [place] [address] Phone: [phone] Fax: [phone]     Social Determinants of Health (SDOH) Interventions    Readmission Risk Interventions No flowsheet data found.

## 2020-05-30 NOTE — NC FL2 (Signed)
[place] LEVEL OF CARE SCREENING TOOL     IDENTIFICATION  Patient Name: Jennifer [name] Birthdate: 1941/01/30 Sex: female Admission Date (Current Location): 05/26/2020  [place] and [place] Number:  Engineering geologist and Address:  [facility], [address] [place], [place] [address]      Provider Number: [id number]  Attending Physician Name and Address:  Eugenie Filler, MD  Relative Name and Phone Number:  [name] [phone]    Current Level of Care: Hospital Recommended Level of Care: [place] Prior Approval Number:    Date Approved/Denied:   PASRR Number: [id number]  Discharge Plan:      Current Diagnoses: Patient Active Problem List   Diagnosis Date Noted  . AMS (altered mental status) 05/29/2020  . Acute metabolic encephalopathy   . Ascites   . Hypomagnesemia   . Altered mental status 05/26/2020  . Hyponatremia 05/19/2020  . Chronic hyponatremia 05/19/2020  . Essential hypertension, benign 02/04/2018  . Iron deficiency 02/04/2018  . Pelvic fracture ([place]) 01/31/2018  . Multiple gastric polyps   . Hx of colonic polyps   . Acute upper GI bleed 07/12/2017  . Liver cirrhosis secondary to NASH ([place])   . Carpal tunnel syndrome on right 09/28/2016  . Obesity (BMI 30.0-34.9) 09/20/2016  . Upper respiratory infection 03/17/2016  . Thrombocytopenia ([place]) 12/29/2015  . Benign neoplasm of ascending colon   . Benign neoplasm of transverse colon   . Benign neoplasm of sigmoid colon   . Allergic rhinitis 10/14/2014  . Airway hyperreactivity 10/14/2014  . Type 2 diabetes mellitus with unspecified complications ([place]) 82/99/3716  . Calculus of gallbladder 10/14/2014  . Esophagitis, reflux 10/14/2014  . Vitamin D deficiency 10/14/2014    Orientation RESPIRATION BLADDER Height & Weight     Self,Place  Normal Incontinent Weight: 173 lb 4.5 oz (78.6 kg) Height:  5' 1"  (154.9 cm)  BEHAVIORAL SYMPTOMS/MOOD  NEUROLOGICAL BOWEL NUTRITION STATUS      Incontinent Diet  AMBULATORY STATUS COMMUNICATION OF NEEDS Skin   Extensive Assist Verbally Normal                       Personal Care Assistance Level of Assistance  Bathing,Feeding,Dressing Bathing Assistance: Limited assistance Feeding assistance: Limited assistance Dressing Assistance: Limited assistance     Functional Limitations Info  Sight,Hearing,Speech Sight Info: Adequate Hearing Info: Adequate Speech Info: Adequate    SPECIAL CARE FACTORS FREQUENCY  OT (By licensed OT),PT (By licensed PT)     PT Frequency: 5x week OT Frequency: 5x week            Contractures Contractures Info: Not present    Additional Factors Info  Code Status,Allergies Code Status Info: Full Allergies Info: Amoxicillin, Codeine, Hydrocodone, Nitrofurantoin, Penicillins           Current Medications (05/30/2020):  This is the current hospital active medication list Current Facility-Administered Medications  Medication Dose Route Frequency Provider Last Rate Last Admin  . albuterol (VENTOLIN HFA) 108 (90 Base) MCG/ACT inhaler 1-2 puff  1-2 puff Inhalation Q6H PRN Lenore Cordia, MD      . alum & mag hydroxide-simeth (MAALOX/MYLANTA) 200-200-20 MG/5ML suspension 30 mL  30 mL Oral TID PRN Eugenie Filler, MD      . furosemide (LASIX) tablet 20 mg  20 mg Oral Daily Lenore Cordia, MD   20 mg at 05/30/20 0843  . lactulose (CHRONULAC) 10 GM/15ML solution 20 g  20 g  Oral BID Eugenie Filler, MD   20 g at 05/29/20 2151  . liver oil-zinc oxide (DESITIN) 40 % ointment   Topical PRN Eugenie Filler, MD      . metoprolol tartrate (LOPRESSOR) injection 5 mg  5 mg Intravenous Q4H PRN Eugenie Filler, MD      . nadolol (CORGARD) tablet 10 mg  10 mg Oral Daily Lenore Cordia, MD   10 mg at 05/30/20 0843  . ondansetron (ZOFRAN) tablet 4 mg  4 mg Oral Q6H PRN Lenore Cordia, MD       Or  . ondansetron (ZOFRAN) injection 4 mg  4 mg Intravenous  Q6H PRN Lenore Cordia, MD   4 mg at 05/28/20 1459  . rifaximin (XIFAXAN) tablet 550 mg  550 mg Oral BID Lenore Cordia, MD   550 mg at 05/29/20 2151  . spironolactone (ALDACTONE) tablet 100 mg  100 mg Oral Daily Lenore Cordia, MD   100 mg at 05/30/20 2924     Discharge Medications: Please see discharge summary for a list of discharge medications.  Relevant Imaging Results:  Relevant Lab Results:   Additional Information SS: [id number]  Boris Sharper, LCSW

## 2020-05-30 NOTE — Progress Notes (Signed)
*  PRELIMINARY RESULTS* Echocardiogram 2D Echocardiogram has been performed.  Jennifer [name] 05/30/2020, 12:41 PM

## 2020-05-30 NOTE — Progress Notes (Signed)
PROGRESS NOTE    Jennifer [name]  IOE:[id number] DOB: 1941-01-01 DOA: 05/26/2020 PCP: Mar Daring, PA-C    Chief Complaint  Patient presents with  . Altered Mental Status    Brief Narrative:  HPI Per Dr. Karilyn Cota is a 80 y.o. female with medical history significant for nonalcoholic liver cirrhosis, esophageal varices s/p banding, chronic hyponatremia, thrombocytopenia, diet-controlled type 2 diabetes, hypertension, and asthma who presents to the ED for evaluation of altered mental status.  Patient was recently admitted 05/19/2020-05/24/2020 for acute on chronic hyponatremia.  Initial sodium was 107 compared to baseline around 127.  Nephrology were consulted and she was managed with 3% saline and tolvaptan.  On discharge, sodium was 131.  Patient was also found to have Proteus mirabilis UTI for which she completed 5 days of cephalosporin antibiotic therapy.  Patient returns with complaint of shakes of her upper extremities and feeling cold.  She has noticed increased swelling of her abdomen and bilateral lower extremities.  Reports good urine output without dysuria.  She says her last bowel movement was earlier today.  She is awake but keeps her eyes mostly closed.  She is oriented to self, place, and year but history is otherwise limited as she keeps repeating "God help me."  ED Course:  Initial vitals showed BP 105/46, pulse 58, RR 16, temp 97.8 F, SPO2 97% on room air.  Labs are notable for sodium 127, potassium 3.8, chloride 89, bicarb 28, BUN 10, creatinine 0.77, magnesium 1.4, serum glucose 142, AST 44, ALT 17, alk phos 80, total bilirubin 2.0, WBC 6.2, hemoglobin 11.9, platelets 92,000, ammonia 14, BNP 187.6, TSH 1.526.  SARS-CoV-2 PCR pending was ordered and pending.  CT head without contrast is negative for evidence of acute intracranial abnormality.  2 view chest x-ray shows low lung volumes without focal consolidation, effusion, or edema.  RUQ  ultrasound shows cholelithiasis and gallbladder sludge without evidence of acute cholecystitis.  Cirrhotic liver with associated ascites noted.  Patient was given 500 cc normal saline, IV magnesium 2 g.  The hospitalist service was consulted to admit for further evaluation and management.  Assessment & Plan:   Principal Problem:   Altered mental status Active Problems:   Type 2 diabetes mellitus with unspecified complications (HCC)   Calculus of gallbladder   Thrombocytopenia (HCC)   Liver cirrhosis secondary to NASH (HCC)   Hyponatremia   Acute metabolic encephalopathy   Ascites   Hypomagnesemia   AMS (altered mental status)  1 acute metabolic encephalopathy/?  Hepatic encephalopathy/NASH cirrhosis with ascites/esophageal varices status post banding Patient presented with reported confusion some transient improvement but still confused which seems to be waxing and waning. Patient with improvement after adjustment of lactulose.  Patient alert and oriented to self place and time..   Patient noted on admission to have some repetitive speech and tremors in the upper extremities.   Patient on presentation noted to have abdominal distention and lower extremity edema with some ascites.   Ammonia levels within normal limits however concern for possible hepatic encephalopathy. Patient's acute metabolic encephalopathy likely still secondary to hepatic encephalopathy.  Patient with multiple loose stools after increase of lactulose to 30 g twice daily.  Lactulose decreased to 20 g twice daily and may need to decrease further to titrate for 3-5 bowel movements per day.   Continue Xifaxan 550 mg twice daily, Lasix, spironolactone, nadolol.  Patient's primary gastroenterologist, Dr. Marius Ditch informed of patient's admission who did a social visit( 05/28/2020).  Discontinued IV Rocephin.  Supportive care.  Follow.  2.  Chronic hyponatremia Likely secondary to liver cirrhosis with baseline sodium  approximately 130.  Currently at baseline.  Sodium at 133.  Follow-up.  3.  Hypomagnesemia Magnesium at 2.2.  Follow.  4.  Chronic thrombocytopenia Likely secondary to liver cirrhosis.  No bleeding.  Follow.  5.  Diet-controlled type 2 diabetes mellitus Hemoglobin A1c 5.7 05/19/2020.  Blood glucose level of 101 this morning on labs.  Outpatient follow-up.   6. chronic cholelithiasis Currently asymptomatic.  Outpatient follow-up with general surgery.  7.  Lower abdominal pain Status post recent treatment for UTI.  Repeat UA bland.  Urine cultures pending.  Patient afebrile.  No further abdominal pain.  Gram stain and cultures from paracentesis negative.  IV Rocephin discontinued.    8.  Asymptomatic nonsustained V. tach Patient noted by RN yesterday to have some concern for nonsustained V. tach.  Patient with no further episodes.  Cardiac enzymes were elevated however flat and in doubt ACS.  Patient with no chest pain.  2D echo with EF 60 to 65%, no wall motion abnormality, mild LVH, grade 1 diastolic dysfunction, mild to moderate tricuspid valvular regurgitation, mild mitral valve regurgitation.  Continue home regimen nadolol.  IV Lopressor as needed heart rate > 130  9.  Prognosis Patient with NASH, esophageal varices status post recent banding, presenting with concern for hepatic encephalopathy.  History of chronic hyponatremia.  Patient with recent hospitalization 05/19/2020-05/24/2020 with acute on chronic hyponatremia treated with 3% hypertonic saline and tolvaptan, status post full course of antibiotic treatment for UTI presenting back within a 2-week.  With abdominal ascites, concern for hepatic encephalopathy.  Patient with poor prognosis.  Patient seems to relay that procedure was recommended by GI which she cannot quite remember however stated that she would rather die than have any more further significant invasive procedures.  Palliative care consulted and recommending outpatient  follow-up with palliative care on discharge at SNF.     DVT prophylaxis: SCDs Code Status: Full Family Communication: Updated patient and daughter [name] at bedside.  Disposition:   Status is: Observation    Dispo: The patient is from: Home              Anticipated d/c is to: SNF with palliative care following.              Anticipated d/c date is: 05/31/2020              Patient currently not medically stable for discharge.       Consultants:   Palliative care:  Procedures:   Ultrasound-guided paracentesis 05/27/2020--2.8 L clear yellow fluid removed per IR, Dr. Kathlene Cote  Antimicrobials:   IV Rocephin 05/27/2020>>> 05/29/2020   Subjective: Patient sitting up on bedside commode.  Daughter at bedside.  Alert.  States he is feeling much better.  Complains of multiple stools overnight.  No chest pain.  No shortness of breath.  No abdominal pain.   Objective: Vitals:   05/29/20 1957 05/29/20 2341 05/30/20 0359 05/30/20 0830  BP: (!) 104/57 (!) 96/48 (!) 96/59 (!) 114/52  Pulse: 64 (!) 59 64 62  Resp: 17 20 18 15   Temp: (!) 97.5 F (36.4 C) 97.8 F (36.6 C) 97.7 F (36.5 C) 97.7 F (36.5 C)  TempSrc: Oral Oral Oral   SpO2: 96% 94% 97% 95%  Weight:      Height:        Intake/Output Summary (Last 24 hours) at 05/30/2020  1248 Last data filed at 05/30/2020 0359 Gross per 24 hour  Intake --  Output 1250 ml  Net -1250 ml   Filed Weights   05/26/20 1307 05/29/20 0500  Weight: 84.1 kg 78.6 kg    Examination:  General exam: NAD Respiratory system: CTAB.  No wheezes, no crackles, no rhonchi.  Normal respiratory effort. Cardiovascular system: Regular rate rhythm no murmurs rubs or gallops.  No JVD.  Trace to 1+ bilateral lower extremity edema. Gastrointestinal system: Abdomen is soft, mildly distended, nontender, positive bowel sounds.  No rebound.  No guarding.  Central nervous system: Alert and oriented x3.  Moving extremity spontaneously.. No focal neurological  deficits. Extremities: Symmetric 5 x 5 power. Skin: No rashes, lesions or ulcers Psychiatry: Judgement and insight appear poor. Mood & affect appropriate.     Data Reviewed: I have personally reviewed following labs and imaging studies  CBC: Recent Labs  Lab 05/26/20 1326 05/27/20 0519 05/28/20 0523 05/29/20 0457  WBC 6.2 5.2 5.5 4.8  HGB 11.9* 11.6* 12.3 10.9*  HCT 34.6* 33.7* 35.3* 30.7*  MCV 91.1 90.3 88.7 88.7  PLT 92* 79* 84* 71*    Basic Metabolic Panel: Recent Labs  Lab 05/27/20 0519 05/28/20 0523 05/28/20 1557 05/29/20 0457 05/30/20 0455  NA 129* 130* 132* 133* 133*  K 3.7 3.6 3.5 3.8 3.5  CL 91* 91* 91* 94* 91*  CO2 27 26 26 29 31   GLUCOSE 117* 125* 139* 116* 101*  BUN 12 14 16 20 23   CREATININE 0.89 0.73 0.80 0.97 0.90  CALCIUM 8.9 9.1 9.1 9.0 9.2  MG 1.6* 1.9 2.0 1.7 2.2    GFR: Estimated Creatinine Clearance: 47.3 mL/min (by C-G formula based on SCr of 0.9 mg/dL).  Liver Function Tests: Recent Labs  Lab 05/26/20 1326 05/28/20 0523 05/29/20 0457 05/30/20 0455  AST 44* 38 32 35  ALT 17 17 15 16   ALKPHOS 80 64 57 64  BILITOT 2.0* 1.7* 1.5* 1.5*  PROT 6.2* 5.9* 5.3* 5.7*  ALBUMIN 3.1* 3.1* 2.8* 3.0*    CBG: Recent Labs  Lab 05/23/20 1808 05/23/20 2128 05/24/20 0723 05/28/20 1508  GLUCAP 128* 115* 111* 127*     Recent Results (from the past 240 hour(s))  Resp Panel by RT-PCR (Flu A&B, Covid) Nasopharyngeal Swab     Status: None   Collection Time: 05/26/20  7:22 PM   Specimen: Nasopharyngeal Swab; Nasopharyngeal(NP) swabs in vial transport medium  Result Value Ref Range Status   SARS Coronavirus 2 by RT PCR NEGATIVE NEGATIVE Final    Comment: (NOTE) SARS-CoV-2 target nucleic acids are NOT DETECTED.  The SARS-CoV-2 RNA is generally detectable in upper respiratory specimens during the acute phase of infection. The lowest concentration of SARS-CoV-2 viral copies this assay can detect is 138 copies/mL. A negative result does not  preclude SARS-Cov-2 infection and should not be used as the sole basis for treatment or other patient management decisions. A negative result may occur with  improper specimen collection/handling, submission of specimen other than nasopharyngeal swab, presence of viral mutation(s) within the areas targeted by this assay, and inadequate number of viral copies(<138 copies/mL). A negative result must be combined with clinical observations, patient history, and epidemiological information. The expected result is Negative.  Fact Sheet for Patients:  EntrepreneurPulse.com.au  Fact Sheet for Healthcare Providers:  IncredibleEmployment.be  This test is no t yet approved or cleared by the [place] FDA and  has been authorized for detection and/or diagnosis of SARS-CoV-2 by FDA under  an Emergency Use Authorization (EUA). This EUA will remain  in effect (meaning this test can be used) for the duration of the COVID-19 declaration under Section 564(b)(1) of the Act, 21 U.S.C.section 360bbb-3(b)(1), unless the authorization is terminated  or revoked sooner.       Influenza A by PCR NEGATIVE NEGATIVE Final   Influenza B by PCR NEGATIVE NEGATIVE Final    Comment: (NOTE) The Xpert Xpress SARS-CoV-2/FLU/RSV plus assay is intended as an aid in the diagnosis of influenza from Nasopharyngeal swab specimens and should not be used as a sole basis for treatment. Nasal washings and aspirates are unacceptable for Xpert Xpress SARS-CoV-2/FLU/RSV testing.  Fact Sheet for Patients: EntrepreneurPulse.com.au  Fact Sheet for Healthcare Providers: IncredibleEmployment.be  This test is not yet approved or cleared by the [place] FDA and has been authorized for detection and/or diagnosis of SARS-CoV-2 by FDA under an Emergency Use Authorization (EUA). This EUA will remain in effect (meaning this test can be used) for the  duration of the COVID-19 declaration under Section 564(b)(1) of the Act, 21 U.S.C. section 360bbb-3(b)(1), unless the authorization is terminated or revoked.  Performed at [facility], [address] [place], [place] [address]   Body fluid culture     Status: None   Collection Time: 05/27/20 11:45 AM   Specimen: PATH Cytology Peritoneal fluid  Result Value Ref Range Status   Specimen Description   Final    PERITONEAL Performed at [facility], [address], [place], [place] [address]    Special Requests   Final    NONE Performed at [facility], [address] [place], [place] [address]    Gram Stain NO WBC SEEN NO ORGANISMS SEEN   Final   Culture   Final    NO GROWTH Performed at [facility], [place] [address] [place], [place] [address]    Report Status 05/30/2020 FINAL  Final  Culture, blood (Routine X 2) w Reflex to ID Panel     Status: None (Preliminary result)   Collection Time: 05/27/20  3:26 PM   Specimen: BLOOD  Result Value Ref Range Status   Specimen Description BLOOD BLOOD LEFT HAND  Final   Special Requests   Final    BOTTLES DRAWN AEROBIC ONLY Blood Culture results may not be optimal due to an inadequate volume of blood received in culture bottles   Culture   Final    NO GROWTH 3 DAYS Performed at [name], [address] [place], [place] [address]    Report Status PENDING  Incomplete  Culture, blood (Routine X 2) w Reflex to ID Panel     Status: None (Preliminary result)   Collection Time: 05/27/20  3:27 PM   Specimen: BLOOD  Result Value Ref Range Status   Specimen Description BLOOD LEFT ANTECUBITAL  Final   Special Requests   Final    BOTTLES DRAWN AEROBIC ONLY Blood Culture results may not be optimal due to an inadequate volume of blood received in culture bottles   Culture   Final    NO GROWTH 3 DAYS Performed at [facility], [address], [place],  [address]    Report  Status PENDING  Incomplete         Radiology Studies: No results found.      Scheduled Meds: . furosemide  20 mg Oral Daily  . lactulose  20 g Oral BID  . nadolol  10 mg Oral Daily  .  rifaximin  550 mg Oral BID  . spironolactone  100 mg Oral Daily   Continuous Infusions:    LOS: 1 day    Time spent: 40 minutes    Irine Seal, MD [facility]   To contact the attending provider between 7A-7P or the covering provider during after hours 7P-7A, please log into the web site [email or web address] and access using universal [place] password for that web site. If you do not have the password, please call the hospital operator.  05/30/2020, 12:48 PM

## 2020-05-31 ENCOUNTER — Ambulatory Visit: Payer: Self-pay

## 2020-05-31 ENCOUNTER — Telehealth: Payer: Self-pay

## 2020-05-31 LAB — CBC
HCT: 29.4 % — ABNORMAL LOW (ref 36.0–46.0)
Hemoglobin: 10.3 g/dL — ABNORMAL LOW (ref 12.0–15.0)
MCH: 31.5 pg (ref 26.0–34.0)
MCHC: 35 g/dL (ref 30.0–36.0)
MCV: 89.9 fL (ref 80.0–100.0)
Platelets: 61 10*3/uL — ABNORMAL LOW (ref 150–400)
RBC: 3.27 MIL/uL — ABNORMAL LOW (ref 3.87–5.11)
RDW: 15 % (ref 11.5–15.5)
WBC: 3.8 10*3/uL — ABNORMAL LOW (ref 4.0–10.5)
nRBC: 0 % (ref 0.0–0.2)

## 2020-05-31 LAB — COMPREHENSIVE METABOLIC PANEL
ALT: 14 U/L (ref 0–44)
AST: 32 U/L (ref 15–41)
Albumin: 2.7 g/dL — ABNORMAL LOW (ref 3.5–5.0)
Alkaline Phosphatase: 66 U/L (ref 38–126)
Anion gap: 8 (ref 5–15)
BUN: 21 mg/dL (ref 8–23)
CO2: 30 mmol/L (ref 22–32)
Calcium: 8.7 mg/dL — ABNORMAL LOW (ref 8.9–10.3)
Chloride: 93 mmol/L — ABNORMAL LOW (ref 98–111)
Creatinine, Ser: 0.72 mg/dL (ref 0.44–1.00)
GFR, Estimated: >60 mL/min (ref >60)
Glucose, Bld: 101 mg/dL — ABNORMAL HIGH (ref 70–99)
Potassium: 3.6 mmol/L (ref 3.5–5.1)
Sodium: 131 mmol/L — ABNORMAL LOW (ref 135–145)
Total Bilirubin: 1.5 mg/dL — ABNORMAL HIGH (ref 0.3–1.2)
Total Protein: 5.2 g/dL — ABNORMAL LOW (ref 6.5–8.1)

## 2020-05-31 LAB — CYTOLOGY - NON PAP

## 2020-05-31 MED ORDER — RIFAXIMIN 550 MG PO TABS
550.0000 mg | ORAL_TABLET | Freq: Two times a day (BID) | ORAL | 1 refills | Status: AC
Start: 2020-05-31 — End: ?

## 2020-05-31 MED ORDER — ZINC OXIDE 40 % EX OINT: TOPICAL_OINTMENT | CUTANEOUS | 0 refills | Status: AC | PRN

## 2020-05-31 MED ORDER — POTASSIUM CHLORIDE 20 MEQ PO PACK
40.0000 meq | PACK | Freq: Once | ORAL | Status: AC
  Administered 2020-05-31: 40 meq via ORAL
  Filled 2020-05-31: qty 2

## 2020-05-31 MED ORDER — LACTULOSE 10 GM/15ML PO SOLN: 20.0000 g | Freq: Two times a day (BID) | ORAL | 1 refills | Status: AC

## 2020-05-31 NOTE — TOC Transition Note (Addendum)
Transition of Care [facility]) - CM/SW Discharge Note   Patient Details  Name: Jennifer [name] MRN: [id number] Date of Birth: 05-Jul-1940  [facility] CM/SW Contact:  Magnus Ivan, LCSW Phone Number: 05/31/2020, 12:49 PM   Clinical Narrative:   Patient has orders to discharge home with home health. Outpatient Palliative referral made to [place] by NP. CSW notified [facility] Representative [place], and asked to add SW and Aide services (so patient will have PT, OT, RN, Aide, SW). No additional TOC needs prior to discharge.  3:45- LPN asked CSW to call patient's daughter. She had questions about Home Health. Explained we added Aide and SW to [facility] services and that CSW has notified [facility] of patient being discharged today. She was satisfied with this and reported patient's son will be home with her and [name] will be checkin in regularly. [name] reported she will come pick patient up after her appointment at 4:15 today, updated LPN.  Final next level of care: [place] Barriers to Discharge: Barriers Resolved   Patient Goals and CMS Choice Patient states their goals for this hospitalization and ongoing recovery are:: home with home health CMS Medicare.gov Compare Post Acute Care list provided to:: Patient Represenative (must comment) Choice offered to / list presented to : Adult Children  Discharge Placement                Patient to be transferred to facility by: daughter [name] Name of family member notified: daughter [name] Patient and family notified of of transfer: 05/31/20  Discharge Plan and Services In-house Referral: Clinical Social Work                        HH Arranged: PT,OT,RN,Nurse's [place] Work [employer] Agency: [facility] [place] Date [place] Agency Contacted: 05/31/20   Representative spoke with at [place]: [place] ([place]) Interventions     Readmission Risk  Interventions No flowsheet data found.

## 2020-05-31 NOTE — Discharge Summary (Signed)
Physician Discharge Summary  SHAKEEMA LIPPMAN WUJ:[id number] DOB: Jul 25, 1940 DOA: 05/26/2020  PCP: Mar Daring, PA-C  Admit date: 05/26/2020 Discharge date: 05/31/2020  Time spent: 55 minutes  Recommendations for Outpatient Follow-up:  1. Follow-up with Dr. Marius Ditch, gastroenterology in 1 to 2 weeks for follow-up on chronic cirrhosis and hepatic encephalopathy. Patient will need a comprehensive metabolic profile as well as a magnesium level checked on follow-up. 2. Follow-up with palliative care in the outpatient setting. 3. Follow-up with Mar Daring, PA-C in 2 to 3 weeks. On follow-up patient will need a comprehensive metabolic profile done to follow-up on electrolytes and renal function, magnesium level.   Discharge Diagnoses:  Principal Problem:   Altered mental status Active Problems:   Type 2 diabetes mellitus with unspecified complications (HCC)   Calculus of gallbladder   Thrombocytopenia (HCC)   Liver cirrhosis secondary to NASH (HCC)   Hyponatremia   Acute metabolic encephalopathy   Ascites   Hypomagnesemia   AMS (altered mental status)   Discharge Condition: Stable and improved  Diet recommendation: Heart healthy  Filed Weights   05/26/20 1307 05/29/20 0500 05/31/20 0319  Weight: 84.1 kg 78.6 kg 78.4 kg    History of present illness:  HPI per Dr Karilyn Cota is a 80 y.o. female with medical history significant for nonalcoholic liver cirrhosis, esophageal varices s/p banding, chronic hyponatremia, thrombocytopenia, diet-controlled type 2 diabetes, hypertension, and asthma who presents to the ED for evaluation of altered mental status.  Patient was recently admitted 05/19/2020-05/24/2020 for acute on chronic hyponatremia.  Initial sodium was 107 compared to baseline around 127.  Nephrology were consulted and she was managed with 3% saline and tolvaptan.  On discharge, sodium was 131.  Patient was also found to have Proteus mirabilis UTI for which  she completed 5 days of cephalosporin antibiotic therapy.  Patient returns with complaint of shakes of her upper extremities and feeling cold.  She has noticed increased swelling of her abdomen and bilateral lower extremities.  Reports good urine output without dysuria.  She says her last bowel movement was earlier today.  She is awake but keeps her eyes mostly closed.  She is oriented to self, place, and year but history is otherwise limited as she keeps repeating "God help me."  ED Course:  Initial vitals showed BP 105/46, pulse 58, RR 16, temp 97.8 F, SPO2 97% on room air.  Labs are notable for sodium 127, potassium 3.8, chloride 89, bicarb 28, BUN 10, creatinine 0.77, magnesium 1.4, serum glucose 142, AST 44, ALT 17, alk phos 80, total bilirubin 2.0, WBC 6.2, hemoglobin 11.9, platelets 92,000, ammonia 14, BNP 187.6, TSH 1.526.  SARS-CoV-2 PCR pending was ordered and pending.  CT head without contrast is negative for evidence of acute intracranial abnormality.  2 view chest x-ray shows low lung volumes without focal consolidation, effusion, or edema.  RUQ ultrasound shows cholelithiasis and gallbladder sludge without evidence of acute cholecystitis.  Cirrhotic liver with associated ascites noted.  Patient was given 500 cc normal saline, IV magnesium 2 g.  The hospitalist service was consulted to admit for further evaluation and management.  Review of Systems: All systems reviewed and are negative except as documented in history of present illness above.   Hospital Course:  1 acute metabolic encephalopathy/probable Hepatic encephalopathy/NASH cirrhosis with ascites/esophageal varices status post banding Patient presented with reported confusion some transient improvement but still confused which seems to be waxing and waning early on during the hospitalization. Patient with  improvement after adjustment of lactulose.  Patient alert and oriented to self place and time..    Patient noted on admission to have some repetitive speech and tremors in the upper extremities.   Patient on presentation noted to have abdominal distention and lower extremity edema with some ascites.   Ammonia levels within normal limits however concern for possible hepatic encephalopathy. Patient's acute metabolic encephalopathy likely secondary to hepatic encephalopathy.  Patient with multiple loose stools after increase of lactulose to 30 g twice daily.  Lactulose decreased to 20 g twice daily with a decrease in number of stools with goal 3-5 bowel movements per day. Patient improved clinically and was close to baseline by day of discharge. Patient also maintained on Xifaxan 550 mg twice daily as well as home regimen of Lasix, spironolactone, nadolol.  Patient's primary gastroenterologist, Dr. Marius Ditch informed of patient's admission who did a social visit( 05/28/2020).  Discontinued IV Rocephin.   Outpatient follow-up with GI.  2.  Chronic hyponatremia Likely secondary to liver cirrhosis with baseline sodium approximately 130.   Sodium levels remain at baseline. Sodium was one thirty-one by day of discharge. Outpatient follow-up with GI and PCP.   3.  Hypomagnesemia Repleted. Outpatient follow-up.   4.  Chronic thrombocytopenia Likely secondary to liver cirrhosis.  No bleeding.  Follow.  5.  Diet-controlled type 2 diabetes mellitus Hemoglobin A1c 5.7 05/19/2020.  Blood glucose level were well controlled during the hospitalization. Outpatient follow-up.  6. chronic cholelithiasis Patient remained asymptomatic.  Outpatient follow-up with general surgery.  7.  Lower abdominal pain Status post recent treatment for UTI.  Repeat UA bland.  Urine cultures pending with no growth to date. Patient remained afebrile. Patient had no further abdominal pain.  Gram stain and cultures from paracentesis negative.  IV Rocephin discontinued.    8.  Asymptomatic nonsustained V. tach Patient noted by  RN early on during the hospitalization, to have some concern for nonsustained V. tach.  Patient with no further episodes.  Cardiac enzymes were elevated however flat and in doubt ACS.  Patient with no chest pain.  2D echo with EF 60 to 65%, no wall motion abnormality, mild LVH, grade 1 diastolic dysfunction, mild to moderate tricuspid valvular regurgitation, mild mitral valve regurgitation.  Patient maintained on home regimen nadolol. Patient also placed on IV Lopressor as needed for HR > 130.Marland Kitchen Patient had no further episodes. Electrolytes repleted. Outpatient follow-up.  9.  Prognosis Patient with NASH, esophageal varices status post recent banding, presenting with concern for hepatic encephalopathy.  History of chronic hyponatremia.  Patient with recent hospitalization 05/19/2020-05/24/2020 with acute on chronic hyponatremia treated with 3% hypertonic saline and tolvaptan, status post full course of antibiotic treatment for UTI presenting back within a 2-week.  With abdominal ascites, concern for hepatic encephalopathy.  Patient with poor prognosis.  Patient seems to relay that procedure was recommended by GI which she cannot quite remember however stated that she would rather die than have any more further significant invasive procedures.  Palliative care consulted and recommending outpatient follow-up with palliative care on discharge.   Procedures:  Ultrasound-guided paracentesis 05/27/2020--2.8 L clear yellow fluid removed per IR, Dr. Kathlene Cote   Consultations:  Palliative care:   Discharge Exam: Vitals:   05/31/20 0843 05/31/20 1107  BP: (!) 98/58 (!) 99/54  Pulse: 64 65  Resp: 16 16  Temp: 97.8 F (36.6 C) 97.8 F (36.6 C)  SpO2: 97% 98%    General: NAD Cardiovascular: RRR Respiratory: CTAB  Discharge Instructions  Discharge Instructions    Diet - low sodium heart healthy   Complete by: As directed    Increase activity slowly   Complete by: As directed       Allergies as of 05/31/2020      Reactions   Amoxicillin Other (See Comments)   Yeast infections   Codeine Other (See Comments)   Pt denies   Hydrocodone-acetaminophen Other (See Comments)   Pt denies   Nitrofurantoin Other (See Comments)   Nitrofurantoin Monohyd Macro    "Sugar got high"   Penicillins    Has patient had a PCN reaction causing immediate rash, facial/tongue/throat swelling, SOB or lightheadedness with hypotension: Unknown Has patient had a PCN reaction causing severe rash involving mucus membranes or skin necrosis: Unknown Has patient had a PCN reaction that required hospitalization: Unknown Has patient had a PCN reaction occurring within the last 10 years: Unknown If all of the above answers are "NO", then may proceed with Cephalosporin use.   Sulfa Antibiotics Other (See Comments)      Medication List    TAKE these medications   Accu-Chek Aviva Plus test strip Generic drug: glucose blood To check blood sugar daily   Accu-Chek Aviva Plus w/Device Kit To check blood sugar daily   accu-chek soft touch lancets To check blood sugar daily   albuterol 108 (90 Base) MCG/ACT inhaler Commonly known as: VENTOLIN HFA Inhale 1-2 puffs into the lungs every 6 (six) hours as needed for wheezing or shortness of breath.   Fluzone High-Dose Quadrivalent 0.7 ML Susy Generic drug: Influenza Vac High-Dose Quad   furosemide 20 MG tablet Commonly known as: LASIX TAKE ONE TABLET EVERY DAY   lactulose 10 GM/15ML solution Commonly known as: CHRONULAC Take 30 mLs (20 g total) by mouth 2 (two) times daily.   liver oil-zinc oxide 40 % ointment Commonly known as: DESITIN Apply topically as needed for irritation.   Magnesium 200 MG Tabs Take 1 tablet by mouth daily.   nadolol 20 MG tablet Commonly known as: CORGARD TAKE ONE-HALF TABLET BY MOUTH EVERY DAY   pantoprazole 40 MG tablet Commonly known as: PROTONIX TAKE ONE TABLET BY MOUTH TWICE DAILY   rifaximin 550 MG  Tabs tablet Commonly known as: XIFAXAN Take 1 tablet (550 mg total) by mouth 2 (two) times daily.   spironolactone 100 MG tablet Commonly known as: ALDACTONE TAKE ONE TABLET BY MOUTH EVERY DAY      Allergies  Allergen Reactions  . Amoxicillin Other (See Comments)    Yeast infections  . Codeine Other (See Comments)    Pt denies  . Hydrocodone-Acetaminophen Other (See Comments)    Pt denies  . Nitrofurantoin Other (See Comments)  . Nitrofurantoin Monohyd Macro     "Sugar got high"  . Penicillins     Has patient had a PCN reaction causing immediate rash, facial/tongue/throat swelling, SOB or lightheadedness with hypotension: Unknown Has patient had a PCN reaction causing severe rash involving mucus membranes or skin necrosis: Unknown Has patient had a PCN reaction that required hospitalization: Unknown Has patient had a PCN reaction occurring within the last 10 years: Unknown If all of the above answers are "NO", then may proceed with Cephalosporin use.  . Sulfa Antibiotics Other (See Comments)    Follow-up Information    Mar Daring, PA-C. Schedule an appointment as soon as possible for a visit in 2 week(s).   Specialty: Family Medicine Why: f/uin 2-3 weeks. Contact information: [address]  [place] [address] [phone]        Lin Landsman, MD. Schedule an appointment as soon as possible for a visit in 2 week(s).   Specialty: Gastroenterology Why: f/u in 1-2 weeks. Contact information: [place] [address] [phone]        Palliaitve care Follow up.   Why: Outpatient palliative care               The results of significant diagnostics from this hospitalization (including imaging, microbiology, ancillary and laboratory) are listed below for reference.    Significant Diagnostic Studies: DG Chest 2 View  Result Date: 05/26/2020 CLINICAL DATA:  Low blood pressure EXAM: CHEST - 2 VIEW COMPARISON:   01/30/2018 FINDINGS: Low lung volumes. No focal opacity or pleural effusion. Stable cardiomediastinal silhouette. Stable nodularity projecting over right first rib end. No pneumothorax. IMPRESSION: No active cardiopulmonary disease. Electronically Signed   By: Donavan Foil M.D.   On: 05/26/2020 17:23   CT Head Wo Contrast  Result Date: 05/26/2020 CLINICAL DATA:  Mental status change.  Confusion. EXAM: CT HEAD WITHOUT CONTRAST TECHNIQUE: Contiguous axial images were obtained from the base of the skull through the vertex without intravenous contrast. COMPARISON:  CT head February 18, 2010. FINDINGS: Brain: No evidence of acute large vascular territory infarction, hemorrhage, hydrocephalus, extra-axial collection or mass lesion/mass effect. Vascular: No hyperdense vessel identified. Calcific atherosclerosis. Skull: No acute fracture. Sinuses/Orbits: Visualized sinuses are clear. Unremarkable visualized orbits. Other: No mastoid effusions. IMPRESSION: No evidence of acute intracranial abnormality. Electronically Signed   By: Margaretha Sheffield MD   On: 05/26/2020 17:01   CT Abdomen Pelvis W Contrast  Result Date: 05/14/2020 CLINICAL DATA:  Intermittent mid and right upper quadrant abdominal pain exacerbated by heavy meals since 04/23/2020. Cirrhosis of the liver secondary to NASH. EXAM: CT ABDOMEN AND PELVIS WITH CONTRAST TECHNIQUE: Multidetector CT imaging of the abdomen and pelvis was performed using the standard protocol following bolus administration of intravenous contrast. CONTRAST:  116m OMNIPAQUE IOHEXOL 300 MG/ML  SOLN COMPARISON:  Right upper quadrant abdomen ultrasound dated 02/06/2020. Abdomen and pelvis CT dated 07/24/2018. FINDINGS: Lower chest: A partially included rounded ground-glass nodule is demonstrated in the anterior aspect of the right lower lobe on the 1st image, measuring 10 mm in maximum diameter, without significant change since 07/24/2018 and 08/09/2017. Hepatobiliary: Diffuse low  density and mild heterogeneity of the liver with a small right lobe, enlarged lateral segment left lobe and caudate lobe and diffusely irregular contours. Stable cyst anteriorly on the right measuring 8 mm in maximum diameter on image number 14 series 2. Multiple small calcified granulomata. Dilated gallbladder with diffuse wall thickening without pericholecystic fluid. Previously demonstrated gallstone in the neck of the gallbladder, measuring 1.1 cm in maximum diameter. Pancreas: Moderate diffuse pancreatic atrophy. Spleen: Mildly enlarged, measuring 13.8 cm in length, without significant change. Adrenals/Urinary Tract: Small amount of air in the urinary bladder, suggesting recent catheterization. Unremarkable adrenal glands, kidneys and ureters. Stomach/Bowel: Mildly dilated duodenum and proximal jejunum, similar to 07/24/2018. Normal passage of oral contrast through to the rectum. No obstructing mass seen. No evidence of appendicitis. Vascular/Lymphatic: Multiple abdominal varices. Small, recanalized umbilical vein. Minimal atheromatous arterial calcification. No enlarged lymph nodes. Reproductive: Status post hysterectomy. No adnexal masses. Other: Small umbilical hernia containing fat. Moderate amount of free peritoneal fluid, significantly increased since 07/24/2018. Musculoskeletal: Lumbar and lower thoracic spine degenerative changes and mild scoliosis. Stable old 30% L1 vertebral compression deformity and L2 superior endplate Schmorl's node  formation. No pars defects or subluxations. IMPRESSION: 1. Moderate amount of free peritoneal fluid, significantly increased since 07/24/2018. 2. Stable changes of cirrhosis of the liver with portal venous hypertension, including mild splenomegaly and multiple abdominal varices. 3. Dilated gallbladder with diffuse wall thickening and 1.1 cm stone lodged in the neck of the gallbladder. These most likely represent changes due to chronic cholecystitis. 4. Stable mildly  dilated duodenum and proximal jejunum, similar to 07/24/2018. This could be due to mild ileus or chronic mild partial obstruction. 5. Stable 10 mm right lower lobe ground-glass nodule. Repeat CT is recommended every 2 years until 5 years of stability has been established. This recommendation follows the consensus statement: Guidelines for Management of Incidental Pulmonary Nodules Detected on CT Images: From the Fleischner Society 2017; Radiology 2017; 284:228-243. 6. Small amount of air in the urinary bladder, suggesting recent catheterization. Electronically Signed   By: Claudie Revering M.D.   On: 05/14/2020 08:45   US Paracentesis  Result Date: 05/27/2020 INDICATION: Cirrhosis and ascites. EXAM: ULTRASOUND GUIDED PARACENTESIS MEDICATIONS: None. COMPLICATIONS: None immediate. PROCEDURE: Informed written consent was obtained from the patient after a discussion of the risks, benefits and alternatives to treatment. A timeout was performed prior to the initiation of the procedure. Initial ultrasound was performed to localize ascites. The right lower abdomen was prepped and draped in the usual sterile fashion. 1% lidocaine was used for local anesthesia. Following this, a 6 Fr Safe-T-Centesis catheter was introduced. An ultrasound image was saved for documentation purposes. The paracentesis was performed. The catheter was removed and a dressing was applied. The patient tolerated the procedure well without immediate post procedural complication. FINDINGS: A total of approximately 2.8 L of clear, yellow fluid was removed. Samples were sent to the laboratory as requested by the clinical team. IMPRESSION: Successful ultrasound-guided paracentesis yielding 2.8 liters of peritoneal fluid. Electronically Signed   By: Aletta Edouard M.D.   On: 05/27/2020 15:56   ECHOCARDIOGRAM COMPLETE  Result Date: 05/30/2020    ECHOCARDIOGRAM REPORT   Patient Name:   LASHAUNDRA LEHRMANN Date of Exam: 05/30/2020 Medical Rec #:  [id number]        Height:       61.0 in Accession #:    [id number]      Weight:       173.3 lb Date of Birth:  01-Jan-1941        BSA:          1.777 m Patient Age:    22 years        BP:           114/52 mmHg Patient Gender: F               HR:           62 bpm. Exam Location:  [facility] Procedure: 2D Echo, Cardiac Doppler and Color Doppler Indications:     Ventricular Tachycardia I47.2  History:         Patient has prior history of Echocardiogram examinations. Risk                  Factors:Hypertension.  Sonographer:     Alyse Low Roar Referring Phys:  Kenvir Phys: Nelva Bush MD IMPRESSIONS  1. Left ventricular ejection fraction, by estimation, is 60 to 65%. The left ventricle has normal function. Left ventricular endocardial border not optimally defined to evaluate regional wall motion. There is mild left ventricular hypertrophy. Left ventricular diastolic parameters are consistent with  Grade I diastolic dysfunction (impaired relaxation). Elevated left atrial pressure.  2. Right ventricular systolic function is normal. The right ventricular size is normal. There is mildly elevated pulmonary artery systolic pressure.  3. The mitral valve is degenerative. Mild mitral valve regurgitation. No evidence of mitral stenosis. Moderate mitral annular calcification.  4. Tricuspid valve regurgitation is mild to moderate.  5. The aortic valve has an indeterminant number of cusps. There is mild calcification of the aortic valve. There is mild thickening of the aortic valve. Aortic valve regurgitation is not visualized. Mild to moderate aortic valve sclerosis/calcification is present, without any evidence of aortic stenosis.  6. The inferior vena cava is dilated in size with <50% respiratory variability, suggesting right atrial pressure of 15 mmHg. FINDINGS  Left Ventricle: Left ventricular ejection fraction, by estimation, is 60 to 65%. The left ventricle has normal function. Left ventricular endocardial border not  optimally defined to evaluate regional wall motion. The left ventricular internal cavity size was normal in size. There is mild left ventricular hypertrophy. Left ventricular diastolic parameters are consistent with Grade I diastolic dysfunction (impaired relaxation). Elevated left atrial pressure. Right Ventricle: The right ventricular size is normal. No increase in right ventricular wall thickness. Right ventricular systolic function is normal. There is mildly elevated pulmonary artery systolic pressure. The tricuspid regurgitant velocity is 2.43  m/s, and with an assumed right atrial pressure of 15 mmHg, the estimated right ventricular systolic pressure is 16.1 mmHg. Left Atrium: Left atrial size was normal in size. Right Atrium: Right atrial size was normal in size. Pericardium: There is no evidence of pericardial effusion. Mitral Valve: The mitral valve is degenerative in appearance. Moderate mitral annular calcification. Mild mitral valve regurgitation. No evidence of mitral valve stenosis. Tricuspid Valve: The tricuspid valve is grossly normal. Tricuspid valve regurgitation is mild to moderate. Aortic Valve: The aortic valve has an indeterminant number of cusps. There is mild calcification of the aortic valve. There is mild thickening of the aortic valve. Aortic valve regurgitation is not visualized. Mild to moderate aortic valve sclerosis/calcification is present, without any evidence of aortic stenosis. Aortic valve peak gradient measures 7.0 mmHg. Pulmonic Valve: The pulmonic valve was not well visualized. Pulmonic valve regurgitation is not visualized. No evidence of pulmonic stenosis. Aorta: The aortic root is normal in size and structure. Pulmonary Artery: The pulmonary artery is of normal size. Venous: The inferior vena cava is dilated in size with less than 50% respiratory variability, suggesting right atrial pressure of 15 mmHg. IAS/Shunts: The interatrial septum was not well visualized.  LEFT  VENTRICLE PLAX 2D LVIDd:         4.11 cm  Diastology LVIDs:         2.91 cm  LV e' medial:    5.55 cm/s LV PW:         1.01 cm  LV E/e' medial:  15.2 LV IVS:        1.05 cm  LV e' lateral:   6.85 cm/s LVOT diam:     1.70 cm  LV E/e' lateral: 12.3 LVOT Area:     2.27 cm  RIGHT VENTRICLE RV Mid diam:    3.32 cm RV S prime:     12.80 cm/s TAPSE (M-mode): 2.4 cm LEFT ATRIUM           Index       RIGHT ATRIUM           Index LA diam:      3.60 cm  2.03 cm/m  RA Area:     15.00 cm LA Vol (A2C): 31.8 ml 17.89 ml/m RA Volume:   41.40 ml  23.30 ml/m LA Vol (A4C): 36.3 ml 20.43 ml/m  AORTIC VALVE                PULMONIC VALVE AV Area (Vmax): 1.42 cm    PV Vmax:        1.07 m/s AV Vmax:        132.00 cm/s PV Peak grad:   4.6 mmHg AV Peak Grad:   7.0 mmHg    RVOT Peak grad: 3 mmHg LVOT Vmax:      82.70 cm/s  AORTA Ao Root diam: 2.50 cm MITRAL VALVE                TRICUSPID VALVE MV Area (PHT): 5.02 cm     TR Peak grad:   23.6 mmHg MV Decel Time: 151 msec     TR Vmax:        243.00 cm/s MV E velocity: 84.40 cm/s MV A velocity: 107.00 cm/s  SHUNTS MV E/A ratio:  0.79         Systemic Diam: 1.70 cm MV A Prime:    9.7 cm/s Nelva Bush MD Electronically signed by Nelva Bush MD Signature Date/Time: 05/30/2020/12:58:28 PM    Final    US ABDOMEN LIMITED RUQ (LIVER/GB)  Result Date: 05/26/2020 CLINICAL DATA:  Bilirubinemia. EXAM: ULTRASOUND ABDOMEN LIMITED RIGHT UPPER QUADRANT COMPARISON:  None. FINDINGS: Gallbladder: Shadowing echogenic gallstones are seen within the gallbladder lumen. The largest measures approximately 1.1 cm. Heterogeneous gallbladder sludge is also noted. The gallbladder wall measures 12.5 mm in thickness. No sonographic Murphy sign noted by sonographer. Common bile duct: Diameter: 4.1 mm Liver: The liver is nodular in contour and demonstrates coarse echotexture. No focal lesion identified. Within normal limits in parenchymal echogenicity. Portal vein is patent on color Doppler imaging with normal  direction of blood flow towards the liver. Other: Free fluid is seen within the abdomen. IMPRESSION: 1. Cholelithiasis and gallbladder sludge, without evidence of acute cholecystitis. 2. Cirrhotic liver with associated ascites. Electronically Signed   By: Virgina Norfolk M.D.   On: 05/26/2020 18:13   US Abdomen Limited RUQ (LIVER/GB)  Result Date: 05/19/2020 CLINICAL DATA:  Cholelithiasis EXAM: ULTRASOUND ABDOMEN LIMITED RIGHT UPPER QUADRANT COMPARISON:  CT 05/14/2020 FINDINGS: Gallbladder: 1.2 cm stone noted within the gallbladder neck. Sludge within the gallbladder. No wall thickening. No reported sonographic Murphy sign. Common bile duct: Diameter: Upper limits normal in diameter, 7 mm. Distal duct is not visualized due to overlying bowel gas. Liver: Coarsened, increased echotexture throughout the liver. Nodular contours and shrunken appearance. Findings compatible with cirrhosis. No flow seen within the portal vein concerning for portal venous thrombosis. Other: Moderate to large volume ascites. IMPRESSION: Changes of cirrhosis with nodular shrunken liver and associated ascites. No flow seen within the portal vein concerning for portal venous thrombosis. Cholelithiasis.  No sonographic evidence of acute cholecystitis. Electronically Signed   By: Rolm Baptise M.D.   On: 05/19/2020 20:18    Microbiology: Recent Results (from the past 240 hour(s))  Resp Panel by RT-PCR (Flu A&B, Covid) Nasopharyngeal Swab     Status: None   Collection Time: 05/26/20  7:22 PM   Specimen: Nasopharyngeal Swab; Nasopharyngeal(NP) swabs in vial transport medium  Result Value Ref Range Status   SARS Coronavirus 2 by RT PCR NEGATIVE NEGATIVE Final    Comment: (NOTE) SARS-CoV-2 target nucleic acids are  NOT DETECTED.  The SARS-CoV-2 RNA is generally detectable in upper respiratory specimens during the acute phase of infection. The lowest concentration of SARS-CoV-2 viral copies this assay can detect is 138 copies/mL. A  negative result does not preclude SARS-Cov-2 infection and should not be used as the sole basis for treatment or other patient management decisions. A negative result may occur with  improper specimen collection/handling, submission of specimen other than nasopharyngeal swab, presence of viral mutation(s) within the areas targeted by this assay, and inadequate number of viral copies(<138 copies/mL). A negative result must be combined with clinical observations, patient history, and epidemiological information. The expected result is Negative.  Fact Sheet for Patients:  EntrepreneurPulse.com.au  Fact Sheet for Healthcare Providers:  IncredibleEmployment.be  This test is no t yet approved or cleared by the [place] FDA and  has been authorized for detection and/or diagnosis of SARS-CoV-2 by FDA under an Emergency Use Authorization (EUA). This EUA will remain  in effect (meaning this test can be used) for the duration of the COVID-19 declaration under Section 564(b)(1) of the Act, 21 U.S.C.section 360bbb-3(b)(1), unless the authorization is terminated  or revoked sooner.       Influenza A by PCR NEGATIVE NEGATIVE Final   Influenza B by PCR NEGATIVE NEGATIVE Final    Comment: (NOTE) The Xpert Xpress SARS-CoV-2/FLU/RSV plus assay is intended as an aid in the diagnosis of influenza from Nasopharyngeal swab specimens and should not be used as a sole basis for treatment. Nasal washings and aspirates are unacceptable for Xpert Xpress SARS-CoV-2/FLU/RSV testing.  Fact Sheet for Patients: EntrepreneurPulse.com.au  Fact Sheet for Healthcare Providers: IncredibleEmployment.be  This test is not yet approved or cleared by the [place] FDA and has been authorized for detection and/or diagnosis of SARS-CoV-2 by FDA under an Emergency Use Authorization (EUA). This EUA will remain in effect (meaning this test can  be used) for the duration of the COVID-19 declaration under Section 564(b)(1) of the Act, 21 U.S.C. section 360bbb-3(b)(1), unless the authorization is terminated or revoked.  Performed at [facility], [address] [place], [place] [address]   Body fluid culture     Status: None   Collection Time: 05/27/20 11:45 AM   Specimen: PATH Cytology Peritoneal fluid  Result Value Ref Range Status   Specimen Description   Final    PERITONEAL Performed at [facility], [address] [place], [place] [address]    Special Requests   Final    NONE Performed at [facility], [address] [place], [place] [address]    Gram Stain NO WBC SEEN NO ORGANISMS SEEN   Final   Culture   Final    NO GROWTH Performed at [facility], [place] [address], [place], [place] [address]    Report Status 05/30/2020 FINAL  Final  Culture, blood (Routine X 2) w Reflex to ID Panel     Status: None (Preliminary result)   Collection Time: 05/27/20  3:26 PM   Specimen: BLOOD  Result Value Ref Range Status   Specimen Description BLOOD BLOOD LEFT HAND  Final   Special Requests   Final    BOTTLES DRAWN AEROBIC ONLY Blood Culture results may not be optimal due to an inadequate volume of blood received in culture bottles   Culture   Final    NO GROWTH 4 DAYS Performed at [facility], [address] [place], [place] [address]    Report Status PENDING  Incomplete  Culture, blood (Routine X 2) w Reflex to ID Panel     Status: None (Preliminary result)   Collection Time: 05/27/20  3:27 PM   Specimen: BLOOD  Result Value Ref Range Status   Specimen Description BLOOD LEFT ANTECUBITAL  Final   Special Requests   Final    BOTTLES DRAWN AEROBIC ONLY Blood Culture results may not be optimal due to an inadequate volume of blood received in culture bottles   Culture   Final    NO GROWTH 4 DAYS Performed at [facility], [address], [place], [place]  [address]    Report Status PENDING  Incomplete  SARS CORONAVIRUS 2 (TAT 6-24 HRS) Nasopharyngeal Nasopharyngeal Swab     Status: None   Collection Time: 05/30/20  9:16 AM   Specimen: Nasopharyngeal Swab  Result Value Ref Range Status   SARS Coronavirus 2 NEGATIVE NEGATIVE Final    Comment: (NOTE) SARS-CoV-2 target nucleic acids are NOT DETECTED.  The SARS-CoV-2 RNA is generally detectable in upper and lower respiratory specimens during the acute phase of infection. Negative results do not preclude SARS-CoV-2 infection, do not rule out co-infections with other pathogens, and should not be used as the sole basis for treatment or other patient management decisions. Negative results must be combined with clinical observations, patient history, and epidemiological information. The expected result is Negative.  Fact Sheet for Patients: SugarRoll.be  Fact Sheet for Healthcare Providers: https://www.woods-mathews.com/  This test is not yet approved or cleared by the [place] FDA and  has been authorized for detection and/or diagnosis of SARS-CoV-2 by FDA under an Emergency Use Authorization (EUA). This EUA will remain  in effect (meaning this test can be used) for the duration of the COVID-19 declaration under Se ction 564(b)(1) of the Act, 21 U.S.C. section 360bbb-3(b)(1), unless the authorization is terminated or revoked sooner.  Performed at [facility], [place] [address], [place],  [address]      Labs: Basic Metabolic Panel: Recent Labs  Lab 05/27/20 [phone] 05/28/20 0523 05/28/20 1557 05/29/20 0457 05/30/20 0455 05/31/20 0229  NA 129* 130* 132* 133* 133* 131*  K 3.7 3.6 3.5 3.8 3.5 3.6  CL 91* 91* 91* 94* 91* 93*  CO2 27 26 26 29 31 30   GLUCOSE 117* 125* 139* 116* 101* 101*  BUN 12 14 16 20 23 21   CREATININE 0.89 0.73 0.80 0.97 0.90 0.72  CALCIUM 8.9 9.1 9.1 9.0 9.2 8.7*  MG 1.6* 1.9 2.0 1.7 2.2  --    Liver  Function Tests: Recent Labs  Lab 05/26/20 1326 05/28/20 0523 05/29/20 0457 05/30/20 0455 05/31/20 0229  AST 44* 38 32 35 32  ALT 17 17 15 16 14   ALKPHOS 80 64 57 64 66  BILITOT 2.0* 1.7* 1.5* 1.5* 1.5*  PROT 6.2* 5.9* 5.3* 5.7* 5.2*  ALBUMIN 3.1* 3.1* 2.8* 3.0* 2.7*   No results for input(s): LIPASE, AMYLASE in the last 168 hours. Recent Labs  Lab 05/26/20 1326  AMMONIA 14   CBC: Recent Labs  Lab 05/26/20 1326 05/27/20 0519 05/28/20 0523 05/29/20 0457 05/31/20 0229  WBC 6.2 5.2 5.5 4.8 3.8*  HGB 11.9* 11.6* 12.3 10.9* 10.3*  HCT 34.6* 33.7* 35.3* 30.7* 29.4*  MCV 91.1 90.3 88.7 88.7 89.9  PLT 92* 79* 84* 71* 61*   Cardiac Enzymes: No results for input(s): CKTOTAL, CKMB, CKMBINDEX, TROPONINI in the last 168 hours. BNP: BNP (last 3 results) Recent Labs    05/26/20 1326  BNP 187.6*  ProBNP (last 3 results) No results for input(s): PROBNP in the last 8760 hours.  CBG: Recent Labs  Lab 05/28/20 1508  GLUCAP 127*       Signed:  Irine Seal MD.  [facility] 05/31/2020, 1:10 PM

## 2020-05-31 NOTE — Progress Notes (Signed)
[facility] Liaison note: New referral for [facility] community palliative program to follow at home received from Palliative NP Ihor Dow. TOC Meagan Hagwood aware.  Plan is for patient to discharge home today with [facility].   Thank you for this referral. Flo Shanks BSN, RN, [facility] [phone]

## 2020-05-31 NOTE — TOC Progression Note (Addendum)
Transition of Care [facility]) - Progression Note    Patient Details  Name: Jennifer [name] MRN: [id number] Date of Birth: Mar 23, 1941  Transition of Care [facility]) CM/SW [place], LCSW Phone Number: 05/31/2020, 9:10 AM  Clinical Narrative:   Currently no SNF bed offers. CSW reached out to Admissions Workers at [facility] and asked them to review referral.  10:25- Informed by PT recommendation changing to [place]. Called and spoke to patient's daughter [name] who is agreeable to patient returning home with [facility]. She reported patient already has a RW at home. [name] will pick patient up at time of DC. [name] requested a call from MD/Nurse, updated MD and LPN.   Expected Discharge Plan: [place] Barriers to Discharge: Continued Medical Work up  Expected Discharge Plan and Services Expected Discharge Plan: [place] In-house Referral: Clinical Social Work     Living arrangements for the past 2 months: Single Family Home Expected Discharge Date: 05/31/20                                     Social Determinants of Health (SDOH) Interventions    Readmission Risk Interventions No flowsheet data found.

## 2020-05-31 NOTE — Progress Notes (Signed)
Patient is stable and ready for discharge today home with H/H. Patient's IV removed. Writer and NT helped patient pack her belongings and assisted with helping her get dressed. Writer went over discharge paperwork with patient and she verbalized understanding and had no questions and her hard script prescriptions placed in discharge packet. Patient's daughter will be picking patient up to take her home. Writer transported patient to her daughter's car via [place].

## 2020-05-31 NOTE — Progress Notes (Signed)
Chronic Care Management Pharmacy Assistant   Name: Jennifer [name]  MRN: [id number] DOB: 1941-02-20  Reason for Encounter:Diabetes Disease State Call.   PCP : Mar Daring, PA-C  Allergies:   Allergies  Allergen Reactions  . Amoxicillin Other (See Comments)    Yeast infections  . Codeine Other (See Comments)    Pt denies  . Hydrocodone-Acetaminophen Other (See Comments)    Pt denies  . Nitrofurantoin Other (See Comments)  . Nitrofurantoin Monohyd Macro     "Sugar got high"  . Penicillins     Has patient had a PCN reaction causing immediate rash, facial/tongue/throat swelling, SOB or lightheadedness with hypotension: Unknown Has patient had a PCN reaction causing severe rash involving mucus membranes or skin necrosis: Unknown Has patient had a PCN reaction that required hospitalization: Unknown Has patient had a PCN reaction occurring within the last 10 years: Unknown If all of the above answers are "NO", then may proceed with Cephalosporin use.  . Sulfa Antibiotics Other (See Comments)    Medications: Facility-Administered Encounter Medications as of 05/31/2020  Medication  . albuterol (VENTOLIN HFA) 108 (90 Base) MCG/ACT inhaler 1-2 puff  . alum & mag hydroxide-simeth (MAALOX/MYLANTA) 200-200-20 MG/5ML suspension 30 mL  . furosemide (LASIX) tablet 20 mg  . lactulose (CHRONULAC) 10 GM/15ML solution 20 g  . liver oil-zinc oxide (DESITIN) 40 % ointment  . metoprolol tartrate (LOPRESSOR) injection 5 mg  . nadolol (CORGARD) tablet 10 mg  . ondansetron (ZOFRAN) tablet 4 mg   Or  . ondansetron (ZOFRAN) injection 4 mg  . rifaximin (XIFAXAN) tablet 550 mg  . spironolactone (ALDACTONE) tablet 100 mg   Outpatient Encounter Medications as of 05/31/2020  Medication Sig Note  . albuterol (VENTOLIN HFA) 108 (90 Base) MCG/ACT inhaler Inhale 1-2 puffs into the lungs every 6 (six) hours as needed for wheezing or shortness of breath.   . Blood Glucose Monitoring Suppl  (ACCU-CHEK AVIVA PLUS) w/Device KIT To check blood sugar daily   . FLUZONE HIGH-DOSE QUADRIVALENT 0.7 ML SUSY    . furosemide (LASIX) 20 MG tablet TAKE ONE TABLET EVERY DAY (Patient taking differently: Take 20 mg by mouth daily.)   . glucose blood (ACCU-CHEK AVIVA PLUS) test strip To check blood sugar daily   . lactulose (CHRONULAC) 10 GM/15ML solution Take 30 mLs (20 g total) by mouth 2 (two) times daily.   . Lancets (ACCU-CHEK SOFT TOUCH) lancets To check blood sugar daily   . liver oil-zinc oxide (DESITIN) 40 % ointment Apply topically as needed for irritation.   . Magnesium 200 MG TABS Take 1 tablet by mouth daily.   . nadolol (CORGARD) 20 MG tablet TAKE ONE-HALF TABLET BY MOUTH EVERY DAY (Patient taking differently: Take 10 mg by mouth daily.) 05/26/2020: Patient is down to 10 mg once daily  . pantoprazole (PROTONIX) 40 MG tablet TAKE ONE TABLET BY MOUTH TWICE DAILY (Patient taking differently: Take 40 mg by mouth 2 (two) times daily.)   . rifaximin (XIFAXAN) 550 MG TABS tablet Take 1 tablet (550 mg total) by mouth 2 (two) times daily.   Marland Kitchen spironolactone (ALDACTONE) 100 MG tablet TAKE ONE TABLET BY MOUTH EVERY DAY (Patient taking differently: Take 100 mg by mouth daily.)     Current Diagnosis: Patient Active Problem List   Diagnosis Date Noted  . AMS (altered mental status) 05/29/2020  . Acute metabolic encephalopathy   . Ascites   . Hypomagnesemia   . Altered mental status 05/26/2020  . Hyponatremia 05/19/2020  .  Chronic hyponatremia 05/19/2020  . Essential hypertension, benign 02/04/2018  . Iron deficiency 02/04/2018  . Pelvic fracture ([place]) 01/31/2018  . Multiple gastric polyps   . Hx of colonic polyps   . Acute upper GI bleed 07/12/2017  . Liver cirrhosis secondary to NASH ([place])   . Carpal tunnel syndrome on right 09/28/2016  . Obesity (BMI 30.0-34.9) 09/20/2016  . Upper respiratory infection 03/17/2016  . Thrombocytopenia ([place]) 12/29/2015  . Benign neoplasm of ascending  colon   . Benign neoplasm of transverse colon   . Benign neoplasm of sigmoid colon   . Allergic rhinitis 10/14/2014  . Airway hyperreactivity 10/14/2014  . Type 2 diabetes mellitus with unspecified complications ([place]) 14/43/1540  . Calculus of gallbladder 10/14/2014  . Esophagitis, reflux 10/14/2014  . Vitamin D deficiency 10/14/2014    Goals Addressed   None     Recent Relevant Labs: Lab Results  Component Value Date/Time   HGBA1C 5.7 (H) 05/19/2020 11:36 PM   HGBA1C 6.8 (H) 12/05/2019 08:56 AM   MICROALBUR 20 08/30/2017 10:11 AM   MICROALBUR 20 07/27/2016 10:17 AM    Kidney Function Lab Results  Component Value Date/Time   CREATININE 0.72 05/31/2020 02:29 AM   CREATININE 0.90 05/30/2020 04:55 AM   CREATININE 0.98 (H) 07/09/2018 10:57 AM   GFRNONAA >60 05/31/2020 02:29 AM   GFRNONAA 55 (L) 07/09/2018 10:57 AM   GFRAA >60 01/23/2020 10:02 AM   GFRAA 64 07/09/2018 10:57 AM   Patient states she does not have diabetes and "never did". . Current antihyperglycemic regimen:  o None ID . What recent interventions/DTPs have been made to improve glycemic control:  o None ID . Have there been any recent hospitalizations or ED visits since last visit with CPP? Yes  o 04/23/2020 ED abdominal Pain o 05/19/2020 Hyponatremia o 05/26/2020 Hyponatremia . Patient denies hypoglycemic symptoms, including Pale, Sweaty, Shaky, Hungry, Nervous/irritable and Vision changes . Patient denies hyperglycemic symptoms, including blurry vision, excessive thirst, fatigue, polyuria and weakness . How often are you checking your blood sugar? Patient states she does not have diabetes and does not check her blood sugar at home. . What are your blood sugars ranging?  o Fasting: N/A o Before meals: N/A o After meals: N/A o Bedtime: N/A . During the week, how often does your blood glucose drop below 70? Never . Are you checking your feet daily/regularly?   Patient denies pain, numbness or tingling  sensations in her feet. Adherence Review: Is the patient currently on a STATIN medication? No Is the patient currently on ACE/ARB medication? No Does the patient have >5 day gap between last estimated fill dates? Yes   Maryjean Ka  Follow-Up:  Pharmacist Review   [place] Clinical Pharmacist Assistant [phone]

## 2020-05-31 NOTE — Progress Notes (Signed)
Occupational Therapy Treatment Patient Details Name: Jennifer [name] MRN: [id number] DOB: May 31, 1940 Today's Date: 05/31/2020    History of present illness 80 y.o. female with medical history significant for nonalcoholic liver cirrhosis, esophageal varices s/p banding, chronic hyponatremia, thrombocytopenia, diet-controlled type 2 diabetes, hypertension, and asthma who presents to the ED for evaluation of altered mental status   OT comments  Pt seen for OT treatment this date. Pt demos improved mentation and generally improved fxl activity tolerance this date. OT engages pt in UB/LB bathing standing sink-side with SUPV/SETUP for UB and CGA for LB. Pt tolerates well. Pt compeltes standing grooming tasks at the sink with SETUP and no AD for balance. Pt performs fxl mobility within the room with SUPV and no AD. Some occasional light sway, but no gross LOB. D/c recommendation updated to [facility] to reflect pt progress. Will continue to follow acutely.    Follow Up Recommendations  Home health OT;Supervision/Assistance - 24 hour    Equipment Recommendations  3 in 1 bedside commode;Tub/shower seat;Other (comment) (2ww)    Recommendations for Other Services      Precautions / Restrictions Precautions Precautions: Fall Precaution Comments: Seizure precautions Restrictions Weight Bearing Restrictions: No       Mobility Bed Mobility Overal bed mobility: Modified Independent             General bed mobility comments: pt up to chair pre/post session  Transfers Overall transfer level: Needs assistance Equipment used: Rolling walker (2 wheeled);None Transfers: Sit to/from Stand Sit to Stand: Supervision         General transfer comment: improved stability this date    Balance Overall balance assessment: Needs assistance Sitting-balance support: No upper extremity supported;Feet supported Sitting balance-Leahy Scale: Good     Standing balance support: No upper extremity  supported Standing balance-Leahy Scale: Fair                             ADL either performed or assessed with clinical judgement   ADL Overall ADL's : Needs assistance/impaired     Grooming: Wash/dry hands;Wash/dry face;Oral care;Set up;Supervision/safety;Standing Grooming Details (indicate cue type and reason): sink-side Upper Body Bathing: Supervision/ safety;Set up;Standing Upper Body Bathing Details (indicate cue type and reason): sink-side Lower Body Bathing: Min guard;Sit to/from stand;Supervison/ safety   Upper Body Dressing : Set up;Sitting   Lower Body Dressing: Min guard;Sit to/from stand;Supervision/safety       Toileting- Clothing Manipulation and Hygiene: Modified independent;Sitting/lateral lean Toileting - Clothing Manipulation Details (indicate cue type and reason): to perform posterior peri care after using BSC for loose BM     Functional mobility during ADLs: Supervision/safety (SUPV with no AD to walk back and forth to sink and to/from Clinical biochemist)       Vision Patient Visual Report: No change from baseline     Perception     Praxis      Cognition Arousal/Alertness: Awake/alert Behavior During Therapy: WFL for tasks assessed/performed;Anxious Overall Cognitive Status: Within Functional Limits for tasks assessed                                          Exercises Other Exercises Other Exercises: Pt demos improved mentation and generally improved fxl activity tolerance this date. OT engages pt in UB/LB bathing standing sink-side with SUPV/SETUP for UB and CGA for LB. Pt tolerates well. Pt  compeltes standing grooming tasks at the sink with SETUP and no AD for balance. Pt performs fxl mobility within the room with SUPV and no AD. Some occasional light sway, but no gross LOB.   Shoulder Instructions       General Comments      Pertinent Vitals/ Pain       Pain Assessment: No/denies pain  Home Living                                           Prior Functioning/Environment              Frequency  Min 1X/week        Progress Toward Goals  OT Goals(current goals can now be found in the care plan section)  Progress towards OT goals: Progressing toward goals  Acute Rehab OT Goals Patient Stated Goal: To get healthy  Plan Discharge plan remains appropriate    Co-evaluation                 AM-PAC OT "6 Clicks" Daily Activity     Outcome Measure   Help from another person eating meals?: None Help from another person taking care of personal grooming?: A Little Help from another person toileting, which includes using toliet, bedpan, or urinal?: A Little Help from another person bathing (including washing, rinsing, drying)?: A Little Help from another person to put on and taking off regular upper body clothing?: A Little Help from another person to put on and taking off regular lower body clothing?: A Little 6 Click Score: 19    End of Session Equipment Utilized During Treatment: Gait belt  OT Visit Diagnosis: Unsteadiness on feet (R26.81);Muscle weakness (generalized) (M62.81)   Activity Tolerance Patient limited by fatigue   Patient Left in chair;Other (comment) (with PTA presenting for treatment)   Nurse Communication Mobility status        Time: 9323-5573 OT Time Calculation (min): 53 min  Charges: OT General Charges $OT Visit: 1 Visit OT Treatments $Self Care/Home Management : 23-37 mins $Therapeutic Activity: 23-37 mins  Gerrianne Scale, [place], OTR/L ascom [phone] 05/31/20, 2:12 PM

## 2020-05-31 NOTE — Progress Notes (Signed)
PMT consult received and chart reviewed. We apologize for delay in seeing this patient due to high volume of referrals. Plan is for discharge home today. Spoke with Dr. Grandville Silos and recommended outpatient palliative referral. He will add to discharge summary. Updated [place] liaison of palliative referral. Thank you.  NO CHARGE  Ihor Dow, [place], FNP-C Palliative Medicine Team  Phone: [phone] Fax: [phone]

## 2020-05-31 NOTE — Chronic Care Management (AMB) (Signed)
  Chronic Care Management   Outreach Note  05/31/2020 Name: ALMIRA PHETTEPLACE MRN: [id number] DOB: 1940-07-20  Primary Care Provider: Mar Daring, PA-C   A notification was received today to contact Ms. Rosanna Randy. Per message, Ms. Fitch requested assistance with coordinating home health services.   Successful outreach with Ms. Currington today. She is currently admitted at [facility] and wants to confirm resumption of care with [facility]. Reports declining recommendation to transition to a [place] (SNF) at discharge.  Message submitted to the inpatient Transition of Care (TOC) LCSW. Confirmed that Ms. Yeomans will discharge home later today with home health services. Also received call from [facility]. Nurse confirmed that Ms. Bhandari is currently listed on the agency roster for outreach.    Follow Up Plan:  A member of the chronic care management team will reach out to Ms. Rosanna Randy following hospital discharge to engage for chronic care management services.    Cristy [facility] Care Management [facility] [phone]

## 2020-05-31 NOTE — Progress Notes (Signed)
Physical Therapy Treatment Patient Details Name: Jennifer [name] MRN: [id number] DOB: March 25, 1941 Today's Date: 05/31/2020    History of Present Illness 80 y.o. female with medical history significant for nonalcoholic liver cirrhosis, esophageal varices s/p banding, chronic hyponatremia, thrombocytopenia, diet-controlled type 2 diabetes, hypertension, and asthma who presents to the ED for evaluation of altered mental status    PT Comments    Pt finishing with OT session upon arrival.  She is able to continue and complete 1 lap with no AD.  A bit unsteady at times but recovers all imbalances without assist.  She takes seated rest in gym before going up/down stairs with bilateral rails.  Stated church is adding a second rail to her steps at home  She is able to complete a second lap around nursing station with RW.  Overall gait is improved but she stated she does not feel like she needs the walker at this time.  Encouraged her to use it initially upon discharge and when in community/outside until strength increases.  Voices understanding.   While pt does voice some hesitation about going home based on recent readmission, pt has cleared mentally and demonstrates overall safe mobility with good endurance for household distances.  Will change discharge recommendations to reflect improved status. Discussed with TOC. Son lives with her and can provide assistance as needed.   Follow Up Recommendations  Home health PT;Supervision - Intermittent;Supervision for mobility/OOB     Equipment Recommendations  Rolling walker with 5" wheels    Recommendations for Other Services       Precautions / Restrictions Precautions Precautions: Fall Restrictions Weight Bearing Restrictions: No    Mobility  Bed Mobility Overal bed mobility: Modified Independent                Transfers Overall transfer level: Needs assistance Equipment used: Rolling walker (2 wheeled);None Transfers: Sit to/from  Stand Sit to Stand: Supervision            Ambulation/Gait Ambulation/Gait assistance: Min guard Gait Distance (Feet): 320 Feet Assistive device: Rolling walker (2 wheeled);None Gait Pattern/deviations: Step-through pattern Gait velocity: decreased       Stairs Stairs: Yes Stairs assistance: Min guard Stair Management: Two rails Number of Stairs: 4 General stair comments: with ease   Wheelchair Mobility    Modified Rankin (Stroke Patients Only)       Balance Overall balance assessment: Needs assistance Sitting-balance support: No upper extremity supported;Feet supported Sitting balance-Leahy Scale: Good     Standing balance support: No upper extremity supported Standing balance-Leahy Scale: Fair                              Cognition Arousal/Alertness: Awake/alert Behavior During Therapy: WFL for tasks assessed/performed;Anxious Overall Cognitive Status: Within Functional Limits for tasks assessed                                        Exercises      General Comments        Pertinent Vitals/Pain Pain Assessment: No/denies pain    Home Living                      Prior Function            PT Goals (current goals can now be found in the care plan section) Progress towards  PT goals: Progressing toward goals    Frequency    Min 2X/week      PT Plan Discharge plan needs to be updated    Co-evaluation              AM-PAC PT "6 Clicks" Mobility   Outcome Measure  Help needed turning from your back to your side while in a flat bed without using bedrails?: None Help needed moving from lying on your back to sitting on the side of a flat bed without using bedrails?: None Help needed moving to and from a bed to a chair (including a wheelchair)?: None Help needed standing up from a chair using your arms (e.g., wheelchair or bedside chair)?: None Help needed to walk in hospital room?: A Little Help  needed climbing 3-5 steps with a railing? : A Little 6 Click Score: 22    End of Session Equipment Utilized During Treatment: Gait belt Activity Tolerance: Patient tolerated treatment well Patient left: in chair;with chair alarm set;with call bell/phone within reach Nurse Communication: Mobility status       Time: 1000-1015 PT Time Calculation (min) (ACUTE ONLY): 15 min  Charges:  $Gait Training: 8-22 mins                    Chesley Noon, PTA 05/31/20, 10:24 AM

## 2020-06-01 ENCOUNTER — Ambulatory Visit: Payer: Self-pay

## 2020-06-01 ENCOUNTER — Telehealth: Payer: Self-pay | Admitting: *Deleted

## 2020-06-01 ENCOUNTER — Telehealth: Payer: Self-pay

## 2020-06-01 DIAGNOSIS — E118 Type 2 diabetes mellitus with unspecified complications: Secondary | ICD-10-CM

## 2020-06-01 LAB — CULTURE, BLOOD (ROUTINE X 2)
Culture: NO GROWTH
Culture: NO GROWTH

## 2020-06-01 NOTE — Chronic Care Management (AMB) (Signed)
  Care Management   Note  06/01/2020 Name: Jennifer [name] MRN: [id number] DOB: 1940-06-29  Jennifer [name] is a 80 y.o. year old female who is a primary care patient of [facility] and is actively engaged with the care management team. I reached out to [name] by phone today to assist with scheduling a follow up visit with the RN Case Manager.  Follow up plan: Unsuccessful telephone outreach attempt made. A HIPAA compliant phone message was left for the patient providing contact information and requesting a return call. The care management team will reach out to the patient again over the next 7 days. If patient returns call to provider office, please advise to call [place] at [phone].  [place] Management

## 2020-06-01 NOTE — Telephone Encounter (Signed)
HFU scheduled for 06/07/20.

## 2020-06-01 NOTE — Telephone Encounter (Signed)
Transition Care Management Unsuccessful Follow-up Telephone Call  Date of discharge and from where:  [facility] on 05/31/20.  Attempts:  1st Attempt  Reason for unsuccessful TCM follow-up call:  Left voice message

## 2020-06-01 NOTE — Chronic Care Management (AMB) (Signed)
  Care Management   Note  06/01/2020 Name: Jennifer [name] MRN: [id number] DOB: January 29, 1941  Jennifer [name] is a 80 y.o. year old female who is a primary care patient of [facility] and is actively engaged with the care management team. I reached out to [name] by phone today to assist with scheduling a follow up visit with the RN Case Manager  Follow up plan: Telephone appointment with care management team member scheduled for:06/02/2020  [place] Management

## 2020-06-02 ENCOUNTER — Telehealth: Payer: Medicare HMO

## 2020-06-02 ENCOUNTER — Telehealth: Payer: Self-pay | Admitting: Gastroenterology

## 2020-06-02 ENCOUNTER — Telehealth: Payer: Self-pay | Admitting: Physician Assistant

## 2020-06-02 ENCOUNTER — Emergency Department
Admission: EM | Admit: 2020-06-02 | Discharge: 2020-06-03 | Disposition: A | Discharge status: Home / Self Care | Payer: Medicare HMO | Source: Home / Self Care | Attending: Emergency Medicine | Admitting: Emergency Medicine

## 2020-06-02 ENCOUNTER — Telehealth: Payer: Self-pay

## 2020-06-02 ENCOUNTER — Emergency Department: Payer: Medicare HMO

## 2020-06-02 ENCOUNTER — Other Ambulatory Visit: Payer: Self-pay

## 2020-06-02 DIAGNOSIS — K219 Gastro-esophageal reflux disease without esophagitis: Secondary | ICD-10-CM | POA: Diagnosis present

## 2020-06-02 DIAGNOSIS — E669 Obesity, unspecified: Secondary | ICD-10-CM | POA: Diagnosis present

## 2020-06-02 DIAGNOSIS — R159 Full incontinence of feces: Secondary | ICD-10-CM | POA: Diagnosis not present

## 2020-06-02 DIAGNOSIS — E119 Type 2 diabetes mellitus without complications: Secondary | ICD-10-CM | POA: Insufficient documentation

## 2020-06-02 DIAGNOSIS — R4182 Altered mental status, unspecified: Secondary | ICD-10-CM | POA: Diagnosis not present

## 2020-06-02 DIAGNOSIS — Z885 Allergy status to narcotic agent status: Secondary | ICD-10-CM | POA: Diagnosis not present

## 2020-06-02 DIAGNOSIS — R442 Other hallucinations: Secondary | ICD-10-CM | POA: Diagnosis not present

## 2020-06-02 DIAGNOSIS — D693 Immune thrombocytopenic purpura: Secondary | ICD-10-CM | POA: Diagnosis present

## 2020-06-02 DIAGNOSIS — M1990 Unspecified osteoarthritis, unspecified site: Secondary | ICD-10-CM | POA: Diagnosis present

## 2020-06-02 DIAGNOSIS — E871 Hypo-osmolality and hyponatremia: Secondary | ICD-10-CM | POA: Diagnosis present

## 2020-06-02 DIAGNOSIS — K7469 Other cirrhosis of liver: Secondary | ICD-10-CM | POA: Diagnosis not present

## 2020-06-02 DIAGNOSIS — Z87891 Personal history of nicotine dependence: Secondary | ICD-10-CM | POA: Insufficient documentation

## 2020-06-02 DIAGNOSIS — K2100 Gastro-esophageal reflux disease with esophagitis, without bleeding: Secondary | ICD-10-CM | POA: Diagnosis present

## 2020-06-02 DIAGNOSIS — I10 Essential (primary) hypertension: Secondary | ICD-10-CM | POA: Insufficient documentation

## 2020-06-02 DIAGNOSIS — R251 Tremor, unspecified: Secondary | ICD-10-CM | POA: Diagnosis present

## 2020-06-02 DIAGNOSIS — R410 Disorientation, unspecified: Secondary | ICD-10-CM

## 2020-06-02 DIAGNOSIS — Z85038 Personal history of other malignant neoplasm of large intestine: Secondary | ICD-10-CM | POA: Insufficient documentation

## 2020-06-02 DIAGNOSIS — R456 Violent behavior: Secondary | ICD-10-CM | POA: Insufficient documentation

## 2020-06-02 DIAGNOSIS — E1165 Type 2 diabetes mellitus with hyperglycemia: Secondary | ICD-10-CM | POA: Diagnosis not present

## 2020-06-02 DIAGNOSIS — Z880 Allergy status to penicillin: Secondary | ICD-10-CM | POA: Diagnosis not present

## 2020-06-02 DIAGNOSIS — E785 Hyperlipidemia, unspecified: Secondary | ICD-10-CM | POA: Diagnosis present

## 2020-06-02 DIAGNOSIS — K7460 Unspecified cirrhosis of liver: Secondary | ICD-10-CM | POA: Diagnosis present

## 2020-06-02 DIAGNOSIS — J45909 Unspecified asthma, uncomplicated: Secondary | ICD-10-CM | POA: Insufficient documentation

## 2020-06-02 DIAGNOSIS — Z79899 Other long term (current) drug therapy: Secondary | ICD-10-CM | POA: Insufficient documentation

## 2020-06-02 DIAGNOSIS — Z20822 Contact with and (suspected) exposure to covid-19: Secondary | ICD-10-CM | POA: Insufficient documentation

## 2020-06-02 DIAGNOSIS — Z6832 Body mass index (BMI) 32.0-32.9, adult: Secondary | ICD-10-CM | POA: Diagnosis not present

## 2020-06-02 DIAGNOSIS — R569 Unspecified convulsions: Secondary | ICD-10-CM | POA: Diagnosis present

## 2020-06-02 DIAGNOSIS — R5381 Other malaise: Secondary | ICD-10-CM | POA: Diagnosis not present

## 2020-06-02 DIAGNOSIS — Z881 Allergy status to other antibiotic agents status: Secondary | ICD-10-CM | POA: Diagnosis not present

## 2020-06-02 DIAGNOSIS — Z86010 Personal history of colonic polyps: Secondary | ICD-10-CM | POA: Diagnosis not present

## 2020-06-02 DIAGNOSIS — R000 Tachycardia, unspecified: Secondary | ICD-10-CM | POA: Diagnosis not present

## 2020-06-02 DIAGNOSIS — R404 Transient alteration of awareness: Secondary | ICD-10-CM | POA: Diagnosis not present

## 2020-06-02 DIAGNOSIS — K7581 Nonalcoholic steatohepatitis (NASH): Secondary | ICD-10-CM | POA: Diagnosis present

## 2020-06-02 DIAGNOSIS — R4020 Unspecified coma: Secondary | ICD-10-CM | POA: Diagnosis not present

## 2020-06-02 DIAGNOSIS — F0390 Unspecified dementia without behavioral disturbance: Secondary | ICD-10-CM | POA: Diagnosis present

## 2020-06-02 DIAGNOSIS — U071 COVID-19: Secondary | ICD-10-CM | POA: Diagnosis present

## 2020-06-02 DIAGNOSIS — Z882 Allergy status to sulfonamides status: Secondary | ICD-10-CM | POA: Diagnosis not present

## 2020-06-02 LAB — URINALYSIS, COMPLETE (UACMP) WITH MICROSCOPIC
Bilirubin Urine: NEGATIVE
Glucose, UA: NEGATIVE mg/dL
Ketones, ur: 5 mg/dL — AB
Nitrite: NEGATIVE
Protein, ur: NEGATIVE mg/dL
Specific Gravity, Urine: 1.026 (ref 1.005–1.030)
pH: 5 (ref 5.0–8.0)

## 2020-06-02 LAB — CBC
HCT: 34.9 % — ABNORMAL LOW (ref 36.0–46.0)
Hemoglobin: 11.7 g/dL — ABNORMAL LOW (ref 12.0–15.0)
MCH: 31.3 pg (ref 26.0–34.0)
MCHC: 33.5 g/dL (ref 30.0–36.0)
MCV: 93.3 fL (ref 80.0–100.0)
Platelets: 67 10*3/uL — ABNORMAL LOW (ref 150–400)
RBC: 3.74 MIL/uL — ABNORMAL LOW (ref 3.87–5.11)
RDW: 15.9 % — ABNORMAL HIGH (ref 11.5–15.5)
WBC: 4.2 10*3/uL (ref 4.0–10.5)
nRBC: 0 % (ref 0.0–0.2)

## 2020-06-02 LAB — COMPREHENSIVE METABOLIC PANEL
ALT: 17 U/L (ref 0–44)
AST: 35 U/L (ref 15–41)
Albumin: 3.3 g/dL — ABNORMAL LOW (ref 3.5–5.0)
Alkaline Phosphatase: 98 U/L (ref 38–126)
Anion gap: 13 (ref 5–15)
BUN: 20 mg/dL (ref 8–23)
CO2: 26 mmol/L (ref 22–32)
Calcium: 10 mg/dL (ref 8.9–10.3)
Chloride: 99 mmol/L (ref 98–111)
Creatinine, Ser: 0.72 mg/dL (ref 0.44–1.00)
GFR, Estimated: >60 mL/min (ref >60)
Glucose, Bld: 151 mg/dL — ABNORMAL HIGH (ref 70–99)
Potassium: 4.2 mmol/L (ref 3.5–5.1)
Sodium: 138 mmol/L (ref 135–145)
Total Bilirubin: 1.9 mg/dL — ABNORMAL HIGH (ref 0.3–1.2)
Total Protein: 6.3 g/dL — ABNORMAL LOW (ref 6.5–8.1)

## 2020-06-02 LAB — AMMONIA: Ammonia: 12 umol/L (ref 9–35)

## 2020-06-02 NOTE — Telephone Encounter (Signed)
You saw patient in the hospital

## 2020-06-02 NOTE — Telephone Encounter (Signed)
rifaximin (XIFAXAN) 550 MG TABS tablet  This medicine is much too expensive, she will have to pay $1000. What can she do? Please [name] (daughter) [phone]

## 2020-06-02 NOTE — Telephone Encounter (Signed)
Verbal ok to start palliative care. Do I need to place referral in epic?  Home health had previously mentioned Hospice as well. Is this desired to be started?

## 2020-06-02 NOTE — ED Notes (Signed)
Pt to CT

## 2020-06-02 NOTE — ED Notes (Signed)
PT at bedside.

## 2020-06-02 NOTE — ED Notes (Signed)
Pt moved to 20 h.

## 2020-06-02 NOTE — ED Provider Notes (Signed)
-----------------------------------------   3:06 PM on 06/02/2020 -----------------------------------------  Blood pressure 122/75, pulse 86, temperature 97.9 F (36.6 C), temperature source Oral, resp. rate (!) 24, height 5' 1"  (1.549 m), weight 78 kg, SpO2 97 %.  Assuming care from Dr. Quentin Cornwall.  In short, Jennifer [name] is a 80 y.o. female with a chief complaint of Altered Mental Status .  Refer to the original H&P for additional details.  The current plan of care is to follow-up recommendations of palliative care and social work.  ----------------------------------------- 8:28 PM on 06/02/2020 -----------------------------------------  Palliative care unfortunately will be unable to evaluate the patient until tomorrow.  [facility] was contacted and will begin arrangements for home hospice care.  Social work also involved to assist with safe disposition.    Blake Divine, MD 06/02/20 2029

## 2020-06-02 NOTE — Telephone Encounter (Signed)
Copied from [place] [id number]. Topic: Quick Communication - See Telephone Encounter >> Jun 02, 2020  9:38 AM Loma [place] wrote: CRM for notification. See Telephone encounter for: 06/02/20. [facility], Pam, nurse, meet w/t Pt Daughter, [name] and Son , Tommy,yesterday, pt in liver failure, can't afford meds to keep toxins cleared out , can no longer meet her needs, and want her approved for hospice. Palliative Care does not seem an option. Call Pam to confirm or discuss at [phone].

## 2020-06-02 NOTE — ED Notes (Signed)
Pt linens changed and peri care provided due to incontinence of loose stool X1 .

## 2020-06-02 NOTE — Telephone Encounter (Signed)
[name] schedule with [facility] is calling pt was d/c from [place] on 05-31-2020 and they are recommending in home pallative service. [facility] would like to know if Jennifer [name] will give orders
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
## 2020-06-02 NOTE — Progress Notes (Signed)
[facility]) Hospital Liaison RN note:  Received request from Dr. Blake Divine for hospice services at home after discharge.[name], TOC is aware. Chart and patient information under review by [facility] physician. Hospice eligibility is pending.   Spoke with daughter, [name] over the phone to initiate education related to hospice philosophy, services and to answer any questions. She verbalized understanding of information given. Plan is for discharge to her home this evening.  DME needs discussed and [name] states no DME needs at present. [name] is the contact person and her number is [phone].  Please provide prescriptions at discharge as needed to ensure ongoing symptom management.  Please call with any hospice related questions or concerns.  Thank you for the opportunity to participate in this patient's care.  Zandra Abts, RN [facility] Liaison [phone]

## 2020-06-02 NOTE — Discharge Instructions (Addendum)
Please follow up with PCP.  Return for any questions or concerns.  Palliative care/Hospice will be in contact with you shortly to set up care.  I h ave also a prescription for an anxiety medication to your pharmacy.  You can take this once daily as needed for anxiety or agitation.

## 2020-06-02 NOTE — Telephone Encounter (Signed)
Patient is currently in ER. Will await patient disposition and address once out. If she is admitted again this may be something discussed there

## 2020-06-02 NOTE — ED Notes (Signed)
Gave phone to pt to speak with son.
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
## 2020-06-02 NOTE — Telephone Encounter (Signed)
Pt is currently in the ED at [facility]. Unable to make outreach call at this time.
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
## 2020-06-02 NOTE — ED Provider Notes (Signed)
[facility] Emergency Department Provider Note    Event Date/Time   First MD Initiated Contact with Patient 06/02/20 1101     (approximate)  I have reviewed the triage vital signs and the nursing notes.   HISTORY  Chief Complaint Altered Mental Status  Level V Caveat:  AMS - received IM B52 with EMS  HPI Jennifer [name] is a 80 y.o. female   presents to the ER for evaluation of altered mental status and reported combativeness.  Patient recently admitted for altered mental status found to be significantly hyponatremic in the setting of known cirrhosis.  Patient received a B-52 during transport with EMS she arrives calm cooperative very poor historian she is drowsy.  No report of any trauma.   Past Medical History:  Diagnosis Date  . Allergy   . Arthritis   . Asthma   . Colon polyp   . Fatty liver   . GERD (gastroesophageal reflux disease)   . Hyperlipidemia   . Hypertension   . Motion sickness    boats   Family History  Problem Relation Age of Onset  . Dementia Mother   . Alcohol abuse Father   . Cancer Sister        lung  . Lymphoma Sister   . Melanoma Sister   . Lymphoma Sister    Past Surgical History:  Procedure Laterality Date  . ABDOMINAL HYSTERECTOMY  1978  . COLONOSCOPY WITH PROPOFOL N/A 02/01/2015   Procedure: COLONOSCOPY WITH PROPOFOL;  Surgeon: Lucilla Lame, MD;  Location: [place];  Service: Endoscopy;  Laterality: N/A;  Diabetic - oral meds  . COLONOSCOPY WITH PROPOFOL N/A 11/22/2017   Procedure: COLONOSCOPY WITH PROPOFOL;  Surgeon: Lin Landsman, MD;  Location: [facility] ENDOSCOPY;  Service: Gastroenterology;  Laterality: N/A;  . ESOPHAGOGASTRODUODENOSCOPY (EGD) WITH PROPOFOL N/A 07/12/2017   Procedure: ESOPHAGOGASTRODUODENOSCOPY (EGD) WITH PROPOFOL;  Surgeon: Lin Landsman, MD;  Location: [facility] ENDOSCOPY;  Service: Gastroenterology;  Laterality: N/A;  . ESOPHAGOGASTRODUODENOSCOPY (EGD) WITH PROPOFOL N/A 08/16/2017    Procedure: ESOPHAGOGASTRODUODENOSCOPY (EGD) WITH PROPOFOL;  Surgeon: Lin Landsman, MD;  Location: [facility] ENDOSCOPY;  Service: Gastroenterology;  Laterality: N/A;  . ESOPHAGOGASTRODUODENOSCOPY (EGD) WITH PROPOFOL N/A 09/20/2017   Procedure: ESOPHAGOGASTRODUODENOSCOPY (EGD) WITH PROPOFOL;  Surgeon: Lin Landsman, MD;  Location: [facility] ENDOSCOPY;  Service: Gastroenterology;  Laterality: N/A;  . ESOPHAGOGASTRODUODENOSCOPY (EGD) WITH PROPOFOL N/A 11/01/2017   Procedure: ESOPHAGOGASTRODUODENOSCOPY (EGD) WITH PROPOFOL;  Surgeon: Lin Landsman, MD;  Location: [facility] ENDOSCOPY;  Service: Gastroenterology;  Laterality: N/A;  . ESOPHAGOGASTRODUODENOSCOPY (EGD) WITH PROPOFOL N/A 01/17/2018   Procedure: ESOPHAGOGASTRODUODENOSCOPY (EGD) WITH PROPOFOL;  Surgeon: Lin Landsman, MD;  Location: [facility] ENDOSCOPY;  Service: Gastroenterology;  Laterality: N/A;  . ESOPHAGOGASTRODUODENOSCOPY (EGD) WITH PROPOFOL N/A 06/20/2018   Procedure: ESOPHAGOGASTRODUODENOSCOPY (EGD) WITH PROPOFOL;  Surgeon: Lin Landsman, MD;  Location: [facility] ENDOSCOPY;  Service: Gastroenterology;  Laterality: N/A;  EGD/ with banding ligation   . IR RADIOLOGIST EVAL & MGMT  07/09/2018  . POLYPECTOMY  02/01/2015   Procedure: POLYPECTOMY;  Surgeon: Lucilla Lame, MD;  Location: [place];  Service: Endoscopy;;  . TONSILLECTOMY AND ADENOIDECTOMY     Patient Active Problem List   Diagnosis Date Noted  . AMS (altered mental status) 05/29/2020  . Acute metabolic encephalopathy   . Ascites   . Hypomagnesemia   . Altered mental status 05/26/2020  . Hyponatremia 05/19/2020  . Chronic hyponatremia 05/19/2020  . Essential hypertension, benign 02/04/2018  . Iron deficiency 02/04/2018  .  Pelvic fracture ([place]) 01/31/2018  . Multiple gastric polyps   . Hx of colonic polyps   . Acute upper GI bleed 07/12/2017  . Liver cirrhosis secondary to NASH ([place])   . Carpal tunnel syndrome on right 09/28/2016  . Obesity (BMI 30.0-34.9)  09/20/2016  . Upper respiratory infection 03/17/2016  . Thrombocytopenia ([place]) 12/29/2015  . Benign neoplasm of ascending colon   . Benign neoplasm of transverse colon   . Benign neoplasm of sigmoid colon   . Allergic rhinitis 10/14/2014  . Airway hyperreactivity 10/14/2014  . Type 2 diabetes mellitus with unspecified complications ([place]) 78/24/2353  . Calculus of gallbladder 10/14/2014  . Esophagitis, reflux 10/14/2014  . Vitamin D deficiency 10/14/2014      Prior to Admission medications   Medication Sig Start Date End Date Taking? Authorizing Provider  albuterol (VENTOLIN HFA) 108 (90 Base) MCG/ACT inhaler Inhale 1-2 puffs into the lungs every 6 (six) hours as needed for wheezing or shortness of breath. 12/05/19   Mar Daring, PA-C  Blood Glucose Monitoring Suppl (ACCU-CHEK AVIVA PLUS) w/Device KIT To check blood sugar daily 03/26/20   [name] M, PA-C  FLUZONE HIGH-DOSE QUADRIVALENT 0.7 ML SUSY  01/22/19   [provider]  furosemide (LASIX) 20 MG tablet TAKE ONE TABLET EVERY DAY Patient taking differently: Take 20 mg by mouth daily. 02/16/20   Mar Daring, PA-C  glucose blood (ACCU-CHEK AVIVA PLUS) test strip To check blood sugar daily 01/28/20   Mar Daring, PA-C  lactulose (CHRONULAC) 10 GM/15ML solution Take 30 mLs (20 g total) by mouth 2 (two) times daily. 05/31/20   Eugenie Filler, MD  Lancets (ACCU-CHEK SOFT [facility]) lancets To check blood sugar daily 12/12/18   Mar Daring, PA-C  liver oil-zinc oxide (DESITIN) 40 % ointment Apply topically as needed for irritation. 05/31/20   Eugenie Filler, MD  Magnesium 200 MG TABS Take 1 tablet by mouth daily.    [provider]  nadolol (CORGARD) 20 MG tablet TAKE ONE-HALF TABLET BY MOUTH EVERY DAY Patient taking differently: Take 10 mg by mouth daily. 10/07/19   Mar Daring, PA-C  pantoprazole (PROTONIX) 40 MG tablet TAKE ONE TABLET BY MOUTH TWICE DAILY Patient taking  differently: Take 40 mg by mouth 2 (two) times daily. 02/26/20   Mar Daring, PA-C  rifaximin (XIFAXAN) 550 MG TABS tablet Take 1 tablet (550 mg total) by mouth 2 (two) times daily. 05/31/20   Eugenie Filler, MD  spironolactone (ALDACTONE) 100 MG tablet TAKE ONE TABLET BY MOUTH EVERY DAY Patient taking differently: Take 100 mg by mouth daily. 03/04/20   Mar Daring, PA-C    Allergies Amoxicillin, Codeine, Hydrocodone-acetaminophen, Nitrofurantoin, Nitrofurantoin monohyd macro, Penicillins, and Sulfa antibiotics    Social History Social History   Tobacco Use  . Smoking status: Former Smoker    Packs/day: 0.50    Years: 6.00    Pack years: 3.00    Types: Cigarettes    Quit date: 05/08/1987    Years since quitting: 33.0  . Smokeless tobacco: Never Used  Vaping Use  . Vaping Use: Never used  Substance Use Topics  . Alcohol use: Never  . Drug use: Never    Review of Systems Patient denies headaches, rhinorrhea, blurry vision, numbness, shortness of breath, chest pain, edema, cough, abdominal pain, nausea, vomiting, diarrhea, dysuria, fevers, rashes or hallucinations unless otherwise stated above in HPI. ____________________________________________   PHYSICAL EXAM:  VITAL SIGNS: Vitals:   06/02/20 1400  06/02/20 1430  BP: 126/65 122/75  Pulse: 81 86  Resp: (!) 22 (!) 24  Temp:    SpO2: 97% 97%    Constitutional: Alert drowsy but calm and cooperative Eyes: Conjunctivae are normal.  Head: Atraumatic. Nose: No congestion/rhinnorhea. Mouth/Throat: Mucous membranes are moist.   Neck: No stridor. Painless ROM.  Cardiovascular: Normal rate, regular rhythm. Grossly normal heart sounds.  Good peripheral circulation. Respiratory: Normal respiratory effort.  No retractions. Lungs CTAB. Gastrointestinal: Soft and nontender. No distention. No abdominal bruits. No CVA tenderness. Genitourinary:  Musculoskeletal: No lower extremity tenderness nor edema.  No  joint effusions. Neurologic:  Normal speech and language. No gross focal neurologic deficits are appreciated. No facial droop, no asterixis Skin:  Skin is warm, dry and intact. No rash noted. Psychiatric: Mood and affect are normal. Speech and behavior are normal.  ____________________________________________   LABS (all labs ordered are listed, but only abnormal results are displayed)  Results for orders placed or performed during the hospital encounter of 06/02/20 (from the past 24 hour(s))  Comprehensive metabolic panel     Status: Abnormal   Collection Time: 06/02/20 10:51 AM  Result Value Ref Range   Sodium 138 135 - 145 mmol/L   Potassium 4.2 3.5 - 5.1 mmol/L   Chloride 99 98 - 111 mmol/L   CO2 26 22 - 32 mmol/L   Glucose, Bld 151 (H) 70 - 99 mg/dL   BUN 20 8 - 23 mg/dL   Creatinine, Ser 0.72 0.44 - 1.00 mg/dL   Calcium 10.0 8.9 - 10.3 mg/dL   Total Protein 6.3 (L) 6.5 - 8.1 g/dL   Albumin 3.3 (L) 3.5 - 5.0 g/dL   AST 35 15 - 41 U/L   ALT 17 0 - 44 U/L   Alkaline Phosphatase 98 38 - 126 U/L   Total Bilirubin 1.9 (H) 0.3 - 1.2 mg/dL   GFR, Estimated >60 >60 mL/min   Anion gap 13 5 - 15  CBC     Status: Abnormal   Collection Time: 06/02/20 10:51 AM  Result Value Ref Range   WBC 4.2 4.0 - 10.5 K/uL   RBC 3.74 (L) 3.87 - 5.11 MIL/uL   Hemoglobin 11.7 (L) 12.0 - 15.0 g/dL   HCT 34.9 (L) 36.0 - 46.0 %   MCV 93.3 80.0 - 100.0 fL   MCH 31.3 26.0 - 34.0 pg   MCHC 33.5 30.0 - 36.0 g/dL   RDW 15.9 (H) 11.5 - 15.5 %   Platelets 67 (L) 150 - 400 K/uL   nRBC 0.0 0.0 - 0.2 %  Ammonia     Status: None   Collection Time: 06/02/20 10:51 AM  Result Value Ref Range   Ammonia 12 9 - 35 umol/L  Urinalysis, Complete w Microscopic Urine, Clean Catch     Status: Abnormal   Collection Time: 06/02/20 11:13 AM  Result Value Ref Range   Color, Urine AMBER (A) YELLOW   APPearance HAZY (A) CLEAR   Specific Gravity, Urine 1.026 1.005 - 1.030   pH 5.0 5.0 - 8.0   Glucose, UA NEGATIVE  NEGATIVE mg/dL   Hgb urine dipstick SMALL (A) NEGATIVE   Bilirubin Urine NEGATIVE NEGATIVE   Ketones, ur 5 (A) NEGATIVE mg/dL   Protein, ur NEGATIVE NEGATIVE mg/dL   Nitrite NEGATIVE NEGATIVE   Leukocytes,Ua TRACE (A) NEGATIVE   RBC / HPF 0-5 0 - 5 RBC/hpf   WBC, UA 6-10 0 - 5 WBC/hpf   Bacteria, UA RARE (A) NONE SEEN  Squamous Epithelial / LPF 6-10 0 - 5   Mucus PRESENT    Hyaline Casts, UA PRESENT    ____________________________________________  EKG My review and personal interpretation at Time: 10:44   Indication: ams  Rate: 100  Rhythm: sinus Axis: normal Other: normal intervals, poor r wave progression, no stemi ____________________________________________  RADIOLOGY  I personally reviewed all radiographic images ordered to evaluate for the above acute complaints and reviewed radiology reports and findings.  These findings were personally discussed with the patient.  Please see medical record for radiology report.  ____________________________________________   PROCEDURES  Procedure(s) performed:  Procedures    Critical Care performed: no ____________________________________________   INITIAL IMPRESSION / ASSESSMENT AND PLAN / ED COURSE  Pertinent labs & imaging results that were available during my care of the patient were reviewed by me and considered in my medical decision making (see chart for details).   DDX: Dehydration, sepsis, pna, uti, hypoglycemia, cva, drug effect, withdrawal, encephalitis   Jennifer [name] is a 80 y.o. who presents to the ED with presentation as described above.  On arrival to the ER she is calm and cooperative.  Somewhat confused no asterixis.  She is afebrile hemodynamically stable.  No history of dementia on review of records but was recently admitted for electrolyte abnormalities concern for metabolic encephalopathy and worsening liver failure.  Her blood work returned within normal limits and is reassuring.  No signs of UTI.  She  is afebrile.  CT imaging is reassuring.  Talk to the patient's son who called EMS and states that she was becoming increasingly agitated this morning frustrated because they were not able to obtain her medications due to insurance not covering it.  He states that she is difficult to manage at home.  They have been discussing enrolling her in hospice but the patient states that she wants to be at home for end-of-life.  He states that he is unable to care for her at this time.  She is calm and not showing any signs of acute psychosis.  Blood work is reassuring.  Will consult palliative care medicine as well as social work to see if we might build to give her additional resources at home.  She states that she wants to be discharged home at this time.  Clinical Course as of 06/03/20 1105  Wed Jun 02, 2020  1412 Discussed results and presentation with daughter.  Patient now is completely back to baseline she is calm and cooperative.  Does not appear confused.  States that she got agitated with EMS because she was repeatedly saying that she did not want to be taken to the hospital and felt like she was being abducted by them.  She does not want be placed in skilled nursing facility and wants to be taken back home but by PT recommendations would meet criteria for skilled nursing facility.  Patient unwilling to go to skilled nursing facility but lives at home with her disabled son who is having difficulty caring for her therefore will consult TOC to see if there is any additional help at home we can provide.  Daughter is agreeable to that plan. [PR]  Thu Jun 03, 2020  1104 Pregnancy, urine [PR]    Clinical Course User Index [PR] Merlyn Lot, MD    The patient was evaluated in Emergency Department today for the symptoms described in the history of present illness. He/she was evaluated in the context of the global COVID-19 pandemic, which necessitated consideration that  the patient might be at risk for  infection with the SARS-CoV-2 virus that causes COVID-19. Institutional protocols and algorithms that pertain to the evaluation of patients at risk for COVID-19 are in a state of rapid change based on information released by regulatory bodies including the CDC and federal and state organizations. These policies and algorithms were followed during the patient's care in the ED.  As part of my medical decision making, I reviewed the following data within the [place] notes reviewed and incorporated, Labs reviewed, notes from prior ED visits and [place] Controlled Substance Database   ____________________________________________   FINAL CLINICAL IMPRESSION(S) / ED DIAGNOSES  Final diagnoses:  Confusion      NEW MEDICATIONS STARTED DURING THIS VISIT:  New Prescriptions   No medications on file     Note:  This document was prepared using Dragon voice recognition software and may include unintentional dictation errors.    Merlyn Lot, MD 06/02/20 [phone]

## 2020-06-02 NOTE — Telephone Encounter (Signed)
She is back in the ER today. Rifaximin was started in the hospital for hepatic encephalopathy Alternative medication is lactulose which can be started at 30 g 2 times daily.  However, we can address her prescription issues after she is released from the hospital  Cephas Darby, MD [address]  [place]  [place], [place] [address]  Main: [phone]  Fax: [phone] Pager: [phone]

## 2020-06-03 ENCOUNTER — Inpatient Hospital Stay
Admission: EM | Admit: 2020-06-03 | Discharge: 2020-06-08 | DRG: 100 | Disposition: A | Discharge status: Hospice | Payer: Medicare HMO | Attending: Internal Medicine | Admitting: Internal Medicine

## 2020-06-03 ENCOUNTER — Telehealth: Payer: Self-pay | Admitting: Physician Assistant

## 2020-06-03 ENCOUNTER — Encounter: Payer: Self-pay | Admitting: Emergency Medicine

## 2020-06-03 ENCOUNTER — Other Ambulatory Visit: Payer: Self-pay

## 2020-06-03 ENCOUNTER — Emergency Department: Payer: Medicare HMO

## 2020-06-03 DIAGNOSIS — R569 Unspecified convulsions: Principal | ICD-10-CM | POA: Diagnosis present

## 2020-06-03 DIAGNOSIS — K2100 Gastro-esophageal reflux disease with esophagitis, without bleeding: Secondary | ICD-10-CM | POA: Diagnosis present

## 2020-06-03 DIAGNOSIS — Z882 Allergy status to sulfonamides status: Secondary | ICD-10-CM

## 2020-06-03 DIAGNOSIS — F0390 Unspecified dementia without behavioral disturbance: Secondary | ICD-10-CM | POA: Diagnosis present

## 2020-06-03 DIAGNOSIS — R410 Disorientation, unspecified: Secondary | ICD-10-CM | POA: Diagnosis not present

## 2020-06-03 DIAGNOSIS — E871 Hypo-osmolality and hyponatremia: Secondary | ICD-10-CM | POA: Diagnosis present

## 2020-06-03 DIAGNOSIS — R251 Tremor, unspecified: Secondary | ICD-10-CM | POA: Diagnosis present

## 2020-06-03 DIAGNOSIS — K7581 Nonalcoholic steatohepatitis (NASH): Secondary | ICD-10-CM | POA: Diagnosis present

## 2020-06-03 DIAGNOSIS — Z87891 Personal history of nicotine dependence: Secondary | ICD-10-CM

## 2020-06-03 DIAGNOSIS — J45909 Unspecified asthma, uncomplicated: Secondary | ICD-10-CM | POA: Diagnosis present

## 2020-06-03 DIAGNOSIS — Z881 Allergy status to other antibiotic agents status: Secondary | ICD-10-CM | POA: Diagnosis not present

## 2020-06-03 DIAGNOSIS — K7469 Other cirrhosis of liver: Secondary | ICD-10-CM | POA: Diagnosis not present

## 2020-06-03 DIAGNOSIS — R159 Full incontinence of feces: Secondary | ICD-10-CM | POA: Diagnosis not present

## 2020-06-03 DIAGNOSIS — M1990 Unspecified osteoarthritis, unspecified site: Secondary | ICD-10-CM | POA: Diagnosis present

## 2020-06-03 DIAGNOSIS — E785 Hyperlipidemia, unspecified: Secondary | ICD-10-CM | POA: Diagnosis present

## 2020-06-03 DIAGNOSIS — K219 Gastro-esophageal reflux disease without esophagitis: Secondary | ICD-10-CM | POA: Diagnosis not present

## 2020-06-03 DIAGNOSIS — I10 Essential (primary) hypertension: Secondary | ICD-10-CM

## 2020-06-03 DIAGNOSIS — Z6832 Body mass index (BMI) 32.0-32.9, adult: Secondary | ICD-10-CM | POA: Diagnosis not present

## 2020-06-03 DIAGNOSIS — K7460 Unspecified cirrhosis of liver: Secondary | ICD-10-CM

## 2020-06-03 DIAGNOSIS — R404 Transient alteration of awareness: Secondary | ICD-10-CM | POA: Diagnosis not present

## 2020-06-03 DIAGNOSIS — E1165 Type 2 diabetes mellitus with hyperglycemia: Secondary | ICD-10-CM | POA: Diagnosis not present

## 2020-06-03 DIAGNOSIS — U071 COVID-19: Secondary | ICD-10-CM | POA: Diagnosis not present

## 2020-06-03 DIAGNOSIS — Z885 Allergy status to narcotic agent status: Secondary | ICD-10-CM

## 2020-06-03 DIAGNOSIS — E669 Obesity, unspecified: Secondary | ICD-10-CM | POA: Diagnosis present

## 2020-06-03 DIAGNOSIS — Z880 Allergy status to penicillin: Secondary | ICD-10-CM

## 2020-06-03 DIAGNOSIS — D693 Immune thrombocytopenic purpura: Secondary | ICD-10-CM | POA: Diagnosis present

## 2020-06-03 DIAGNOSIS — E119 Type 2 diabetes mellitus without complications: Secondary | ICD-10-CM

## 2020-06-03 DIAGNOSIS — Z79899 Other long term (current) drug therapy: Secondary | ICD-10-CM

## 2020-06-03 DIAGNOSIS — R4020 Unspecified coma: Secondary | ICD-10-CM | POA: Diagnosis not present

## 2020-06-03 DIAGNOSIS — Z86010 Personal history of colonic polyps: Secondary | ICD-10-CM

## 2020-06-03 LAB — CBC WITH DIFFERENTIAL/PLATELET
Abs Immature Granulocytes: 0.02 10*3/uL (ref 0.00–0.07)
Basophils Absolute: 0 10*3/uL (ref 0.0–0.1)
Basophils Relative: 0 %
Eosinophils Absolute: 0 10*3/uL (ref 0.0–0.5)
Eosinophils Relative: 0 %
HCT: 37.3 % (ref 36.0–46.0)
Hemoglobin: 12.5 g/dL (ref 12.0–15.0)
Immature Granulocytes: 0 %
Lymphocytes Relative: 10 %
Lymphs Abs: 0.4 10*3/uL — ABNORMAL LOW (ref 0.7–4.0)
MCH: 31.4 pg (ref 26.0–34.0)
MCHC: 33.5 g/dL (ref 30.0–36.0)
MCV: 93.7 fL (ref 80.0–100.0)
Monocytes Absolute: 0.3 10*3/uL (ref 0.1–1.0)
Monocytes Relative: 7 %
Neutro Abs: 3.8 10*3/uL (ref 1.7–7.7)
Neutrophils Relative %: 83 %
Platelets: 67 10*3/uL — ABNORMAL LOW (ref 150–400)
RBC: 3.98 MIL/uL (ref 3.87–5.11)
RDW: 15.9 % — ABNORMAL HIGH (ref 11.5–15.5)
WBC: 4.6 10*3/uL (ref 4.0–10.5)
nRBC: 0 % (ref 0.0–0.2)

## 2020-06-03 LAB — COMPREHENSIVE METABOLIC PANEL
ALT: 17 U/L (ref 0–44)
AST: 35 U/L (ref 15–41)
Albumin: 3.2 g/dL — ABNORMAL LOW (ref 3.5–5.0)
Alkaline Phosphatase: 97 U/L (ref 38–126)
Anion gap: 11 (ref 5–15)
BUN: 26 mg/dL — ABNORMAL HIGH (ref 8–23)
CO2: 26 mmol/L (ref 22–32)
Calcium: 9.6 mg/dL (ref 8.9–10.3)
Chloride: 98 mmol/L (ref 98–111)
Creatinine, Ser: 0.86 mg/dL (ref 0.44–1.00)
GFR, Estimated: >60 mL/min (ref >60)
Glucose, Bld: 171 mg/dL — ABNORMAL HIGH (ref 70–99)
Potassium: 4 mmol/L (ref 3.5–5.1)
Sodium: 135 mmol/L (ref 135–145)
Total Bilirubin: 1.8 mg/dL — ABNORMAL HIGH (ref 0.3–1.2)
Total Protein: 5.9 g/dL — ABNORMAL LOW (ref 6.5–8.1)

## 2020-06-03 LAB — SARS CORONAVIRUS 2 (TAT 6-24 HRS): SARS Coronavirus 2: NEGATIVE

## 2020-06-03 LAB — CK: Total CK: 91 U/L (ref 38–234)

## 2020-06-03 LAB — ETHANOL: Alcohol, Ethyl (B): <10 mg/dL (ref <10)

## 2020-06-03 LAB — AMMONIA
Ammonia: 34 umol/L (ref 9–35)
Ammonia: 9 umol/L (ref 9–35)

## 2020-06-03 LAB — SARS CORONAVIRUS 2 BY RT PCR (HOSPITAL ORDER, PERFORMED IN ~~LOC~~ HOSPITAL LAB): SARS Coronavirus 2: POSITIVE — AB

## 2020-06-03 LAB — LACTIC ACID, PLASMA: Lactic Acid, Venous: 2.2 mmol/L (ref 0.5–1.9)

## 2020-06-03 MED ORDER — ONDANSETRON HCL 4 MG/2ML IJ SOLN: 4.0000 mg | Freq: Four times a day (QID) | INTRAMUSCULAR | Status: DC | PRN

## 2020-06-03 MED ORDER — ONDANSETRON HCL 4 MG PO TABS: 4.0000 mg | ORAL_TABLET | Freq: Four times a day (QID) | ORAL | Status: DC | PRN

## 2020-06-03 MED ORDER — PANTOPRAZOLE SODIUM 40 MG PO TBEC
40.0000 mg | DELAYED_RELEASE_TABLET | Freq: Two times a day (BID) | ORAL | Status: DC
  Administered 2020-06-04 – 2020-06-07 (×7): 40 mg via ORAL
  Filled 2020-06-03 (×10): qty 1

## 2020-06-03 MED ORDER — RISPERIDONE 0.25 MG PO TABS
0.2500 mg | ORAL_TABLET | Freq: Every day | ORAL | Status: DC | PRN
Start: 2020-06-03 — End: 2020-06-04
  Filled 2020-06-03: qty 1

## 2020-06-03 MED ORDER — LORAZEPAM 2 MG/ML IJ SOLN: 1.0000 mg | INTRAMUSCULAR | Status: DC | PRN

## 2020-06-03 MED ORDER — LACTULOSE 10 GM/15ML PO SOLN
20.0000 g | Freq: Two times a day (BID) | ORAL | Status: DC
  Filled 2020-06-03: qty 30

## 2020-06-03 MED ORDER — LACTULOSE 10 GM/15ML PO SOLN
20.0000 g | Freq: Two times a day (BID) | ORAL | Status: DC
  Administered 2020-06-03: 20 g via ORAL
  Filled 2020-06-03 (×2): qty 30

## 2020-06-03 MED ORDER — NADOLOL 20 MG PO TABS
10.0000 mg | ORAL_TABLET | Freq: Every day | ORAL | Status: DC
Start: 2020-06-04 — End: 2020-06-08
  Administered 2020-06-04 – 2020-06-07 (×3): 10 mg via ORAL
  Filled 2020-06-03 (×6): qty 1

## 2020-06-03 MED ORDER — MAGNESIUM OXIDE 400 (241.3 MG) MG PO TABS
200.0000 mg | ORAL_TABLET | Freq: Every day | ORAL | Status: DC
Start: 2020-06-04 — End: 2020-06-08
  Administered 2020-06-04 – 2020-06-07 (×4): 200 mg via ORAL
  Filled 2020-06-03 (×5): qty 1

## 2020-06-03 MED ORDER — RIFAXIMIN 550 MG PO TABS
550.0000 mg | ORAL_TABLET | Freq: Two times a day (BID) | ORAL | Status: DC
  Administered 2020-06-04 – 2020-06-07 (×4): 550 mg via ORAL
  Filled 2020-06-03 (×12): qty 1

## 2020-06-03 MED ORDER — ACETAMINOPHEN 325 MG PO TABS: 650.0000 mg | ORAL_TABLET | ORAL | Status: DC | PRN

## 2020-06-03 MED ORDER — FUROSEMIDE 20 MG PO TABS
20.0000 mg | ORAL_TABLET | Freq: Every day | ORAL | Status: DC
  Administered 2020-06-04 – 2020-06-08 (×5): 20 mg via ORAL
  Filled 2020-06-03 (×5): qty 1

## 2020-06-03 MED ORDER — ENOXAPARIN SODIUM 40 MG/0.4ML ~~LOC~~ SOLN: 40.0000 mg | SUBCUTANEOUS | Status: DC

## 2020-06-03 MED ORDER — RIFAXIMIN 200 MG PO TABS
200.0000 mg | ORAL_TABLET | Freq: Two times a day (BID) | ORAL | Status: DC
Start: 2020-06-03 — End: 2020-06-03
  Administered 2020-06-03: 200 mg via ORAL
  Filled 2020-06-03 (×2): qty 1

## 2020-06-03 MED ORDER — TRAZODONE HCL 50 MG PO TABS: 25.0000 mg | ORAL_TABLET | Freq: Every evening | ORAL | Status: DC | PRN

## 2020-06-03 MED ORDER — ALBUTEROL SULFATE HFA 108 (90 BASE) MCG/ACT IN AERS
1.0000 | INHALATION_SPRAY | Freq: Four times a day (QID) | RESPIRATORY_TRACT | Status: DC | PRN
  Filled 2020-06-03: qty 6.7

## 2020-06-03 MED ORDER — RISPERIDONE 0.25 MG PO TABS: 0.2500 mg | ORAL_TABLET | Freq: Every day | ORAL | 0 refills | Status: DC | PRN

## 2020-06-03 MED ORDER — SPIRONOLACTONE 25 MG PO TABS
100.0000 mg | ORAL_TABLET | Freq: Every day | ORAL | Status: DC
  Administered 2020-06-04 – 2020-06-07 (×4): 100 mg via ORAL
  Filled 2020-06-03: qty 1
  Filled 2020-06-03 (×4): qty 4

## 2020-06-03 MED ORDER — MAGNESIUM HYDROXIDE 400 MG/5ML PO SUSP
30.0000 mL | Freq: Every day | ORAL | Status: DC | PRN
  Filled 2020-06-03: qty 30

## 2020-06-03 MED ORDER — SODIUM CHLORIDE 0.9 % IV SOLN
75.0000 mL/h | INTRAVENOUS | Status: DC
  Administered 2020-06-03 – 2020-06-04 (×2): 75 mL/h via INTRAVENOUS

## 2020-06-03 MED ORDER — HALOPERIDOL LACTATE 5 MG/ML IJ SOLN
5.0000 mg | Freq: Once | INTRAMUSCULAR | Status: AC
  Administered 2020-06-03: 5 mg via INTRAMUSCULAR
  Filled 2020-06-03: qty 1

## 2020-06-03 MED ORDER — ACETAMINOPHEN 650 MG RE SUPP: 650.0000 mg | RECTAL | Status: DC | PRN

## 2020-06-03 NOTE — Progress Notes (Addendum)
[place] [facility] ED11 [facility]) Hospital Liaison RN note:  Spoke with daughter, [name] to confirm her interest in her mother going home with hospice. She is in agreement with this and has no questions that were not addressed yesterday when we spoke. Hospice eligibility was approved by [facility] physician. Plan is to discharge home today by private vehicle. Hospital care team is aware.   Thank you for the opportunity to participate in this patient's care.  Loney Laurence [facility] Liaison [phone]

## 2020-06-03 NOTE — ED Notes (Signed)
Pt currently confused and uncooperative when RN attempting to administer medication. Pt agitated at this time. Very tense and rigid. Picking at lip causing it to bleed. Difficult to redirect. MD made aware of current circumstances.

## 2020-06-03 NOTE — Telephone Encounter (Signed)
Yes I could

## 2020-06-03 NOTE — TOC Transition Note (Signed)
Transition of Care [facility]) - CM/SW Discharge Note   Patient Details  Name: Jennifer [name] MRN: [id number] Date of Birth: 03-Jan-1941  Transition of [facility]) CM/SW Contact:  Jennifer [name] Phone Number: [phone] 06/03/2020, 12:12 PM   Clinical Narrative:     Patient will d/c home with [facility], Jennifer Brightly RN following.  Patient's Jennifer [name] (Son) [phone] will transport.  CSW updated patient's daughter Jennifer [name], [phone]. EDP and Ed Staff notified. TOC consult completed      Barriers to Discharge: No Barriers Identified   Patient Goals and CMS Choice        Discharge Placement                       Discharge Plan and Services                                     Social Determinants of Health (SDOH) Interventions     Readmission Risk Interventions No flowsheet data found.

## 2020-06-03 NOTE — ED Notes (Signed)
Pt given shasta cola and meal tray at this time. Meal tray give late due to pt refusal to cooperate when attempting to get her to eat earlier this morning

## 2020-06-03 NOTE — ED Notes (Signed)
Pt now pleasant, calm and cooperative. Took PO medications at 1052

## 2020-06-03 NOTE — Telephone Encounter (Signed)
Horris Latino with [facility] is calling to request if Tawanna Sat would be able to follow the patient under Hospice Care.  Patient is currently a palliative Patient wanting to move to Hospice. Please advise Cb- [phone]

## 2020-06-03 NOTE — ED Notes (Signed)
Pt son signed paper copy of d/c and follow up consent

## 2020-06-03 NOTE — H&P (Addendum)
[place]   PATIENT NAME: Jennifer [name]    MR#:  [id number]  DATE OF BIRTH:   13, 1942  DATE OF ADMISSION:  06/03/2020  PRIMARY CARE PHYSICIAN: Mar Daring, PA-C   REQUESTING/REFERRING PHYSICIAN: Naaman Plummer, MD  CHIEF COMPLAINT:   Chief Complaint  Patient presents with  . Seizures    HISTORY OF PRESENT ILLNESS:  Jennifer [name]  is a 80 y.o. Caucasian female with a known history of hypertension, dyslipidemia, GERD, asthma and osteoarthritis, who presented to the emergency room with acute onset of unresponsiveness and seizure-like activity witnessed by her family.  Upon arrival of EMS the patient was having more of a clonic seizure and appeared to be clenched throughout her whole body.  She was given Narcan initially with no response.  She was then given 4 mg of IV Versed and on route to the hospital she became responsive.  She was postictal with some confusion though.  During my interview she was fairly somnolent but arousable and responding appropriately to questions.  She admitted to stool incontinence during her seizures however denied any tongue bites.  She denies any headache or dizziness or blurred vision.  No paresthesias or focal muscle weakness.  No fever or chills.  No cough or wheezing or hemoptysis.  She had 2 vaccines injections for COVID-19 as well as a booster.  Upon presentation to the ER, vital signs were within normal.  Labs revealed unremarkable CMP with a blood glucose of 171 and total bili of 1.8.  Lactic acid was 2.2 CBC showed thrombocytopenia of 67.  UA showed 6-10 WBCs and 0-5 RBCs with 5 ketones.  Alcohol levels less than 10.  Noncontrasted CT scan showed mild generalized atrophy of the brain that is stable with no acute intracranial abnormality.  COVID-19 PCR came back negative yesterday. EKG yesterday revealed sinus tachycardia with a rate of 100 with low voltage QRS.  The patient will be admitted to a medical monitored bed for further  evaluation and management. PAST MEDICAL HISTORY:   Past Medical History:  Diagnosis Date  . Allergy   . Arthritis   . Asthma   . Colon polyp   . Fatty liver   . GERD (gastroesophageal reflux disease)   . Hyperlipidemia   . Hypertension   . Motion sickness    boats    PAST SURGICAL HISTORY:   Past Surgical History:  Procedure Laterality Date  . ABDOMINAL HYSTERECTOMY  1978  . COLONOSCOPY WITH PROPOFOL N/A 02/01/2015   Procedure: COLONOSCOPY WITH PROPOFOL;  Surgeon: Lucilla Lame, MD;  Location: [place];  Service: Endoscopy;  Laterality: N/A;  Diabetic - oral meds  . COLONOSCOPY WITH PROPOFOL N/A 11/22/2017   Procedure: COLONOSCOPY WITH PROPOFOL;  Surgeon: Lin Landsman, MD;  Location: [facility] ENDOSCOPY;  Service: Gastroenterology;  Laterality: N/A;  . ESOPHAGOGASTRODUODENOSCOPY (EGD) WITH PROPOFOL N/A 07/12/2017   Procedure: ESOPHAGOGASTRODUODENOSCOPY (EGD) WITH PROPOFOL;  Surgeon: Lin Landsman, MD;  Location: [facility] ENDOSCOPY;  Service: Gastroenterology;  Laterality: N/A;  . ESOPHAGOGASTRODUODENOSCOPY (EGD) WITH PROPOFOL N/A 08/16/2017   Procedure: ESOPHAGOGASTRODUODENOSCOPY (EGD) WITH PROPOFOL;  Surgeon: Lin Landsman, MD;  Location: [facility] ENDOSCOPY;  Service: Gastroenterology;  Laterality: N/A;  . ESOPHAGOGASTRODUODENOSCOPY (EGD) WITH PROPOFOL N/A 09/20/2017   Procedure: ESOPHAGOGASTRODUODENOSCOPY (EGD) WITH PROPOFOL;  Surgeon: Lin Landsman, MD;  Location: [facility] ENDOSCOPY;  Service: Gastroenterology;  Laterality: N/A;  . ESOPHAGOGASTRODUODENOSCOPY (EGD) WITH PROPOFOL N/A 11/01/2017   Procedure: ESOPHAGOGASTRODUODENOSCOPY (EGD) WITH PROPOFOL;  Surgeon: Lin Landsman,  MD;  Location: [facility] ENDOSCOPY;  Service: Gastroenterology;  Laterality: N/A;  . ESOPHAGOGASTRODUODENOSCOPY (EGD) WITH PROPOFOL N/A 01/17/2018   Procedure: ESOPHAGOGASTRODUODENOSCOPY (EGD) WITH PROPOFOL;  Surgeon: Lin Landsman, MD;  Location: [facility] ENDOSCOPY;  Service: Gastroenterology;   Laterality: N/A;  . ESOPHAGOGASTRODUODENOSCOPY (EGD) WITH PROPOFOL N/A 06/20/2018   Procedure: ESOPHAGOGASTRODUODENOSCOPY (EGD) WITH PROPOFOL;  Surgeon: Lin Landsman, MD;  Location: [facility] ENDOSCOPY;  Service: Gastroenterology;  Laterality: N/A;  EGD/ with banding ligation   . IR RADIOLOGIST EVAL & MGMT  07/09/2018  . POLYPECTOMY  02/01/2015   Procedure: POLYPECTOMY;  Surgeon: Lucilla Lame, MD;  Location: [address];  Service: Endoscopy;;  . TONSILLECTOMY AND ADENOIDECTOMY      SOCIAL HISTORY:   Social History   Tobacco Use  . Smoking status: Former Smoker    Packs/day: 0.50    Years: 6.00    Pack years: 3.00    Types: Cigarettes    Quit date: 05/08/1987    Years since quitting: 33.0  . Smokeless tobacco: Never Used  Substance Use Topics  . Alcohol use: Never    FAMILY HISTORY:   Family History  Problem Relation Age of Onset  . Dementia Mother   . Alcohol abuse Father   . Cancer Sister        lung  . Lymphoma Sister   . Melanoma Sister   . Lymphoma Sister     DRUG ALLERGIES:   Allergies  Allergen Reactions  . Amoxicillin Other (See Comments)    Yeast infections  . Codeine Other (See Comments)    Pt denies  . Hydrocodone-Acetaminophen Other (See Comments)    Pt denies  . Nitrofurantoin Other (See Comments)  . Nitrofurantoin Monohyd Macro     "Sugar got high"  . Penicillins     Has patient had a PCN reaction causing immediate rash, facial/tongue/throat swelling, SOB or lightheadedness with hypotension: Unknown Has patient had a PCN reaction causing severe rash involving mucus membranes or skin necrosis: Unknown Has patient had a PCN reaction that required hospitalization: Unknown Has patient had a PCN reaction occurring within the last 10 years: Unknown If all of the above answers are "NO", then may proceed with Cephalosporin use.  . Sulfa Antibiotics Other (See Comments)    REVIEW OF SYSTEMS:   ROS As per history of present illness. All  pertinent systems were reviewed above. Constitutional, HEENT, cardiovascular, respiratory, GI, GU, musculoskeletal, neuro, psychiatric, endocrine, integumentary and hematologic systems were reviewed and are otherwise negative/unremarkable except for positive findings mentioned above in the HPI.   MEDICATIONS AT HOME:   Prior to Admission medications   Medication Sig Start Date End Date Taking? Authorizing Provider  albuterol (VENTOLIN HFA) 108 (90 Base) MCG/ACT inhaler Inhale 1-2 puffs into the lungs every 6 (six) hours as needed for wheezing or shortness of breath. 12/05/19   Mar Daring, PA-C  Blood Glucose Monitoring Suppl (ACCU-CHEK AVIVA PLUS) w/Device KIT To check blood sugar daily 03/26/20   [name] M, PA-C  FLUZONE HIGH-DOSE QUADRIVALENT 0.7 ML SUSY  01/22/19   [provider]  furosemide (LASIX) 20 MG tablet TAKE ONE TABLET EVERY DAY Patient taking differently: Take 20 mg by mouth daily. 02/16/20   Mar Daring, PA-C  glucose blood (ACCU-CHEK AVIVA PLUS) test strip To check blood sugar daily 01/28/20   Mar Daring, PA-C  lactulose (CHRONULAC) 10 GM/15ML solution Take 30 mLs (20 g total) by mouth 2 (two) times daily. 05/31/20   Eugenie Filler, MD  Lancets (ACCU-CHEK SOFT TOUCH) lancets To check blood sugar daily 12/12/18   Mar Daring, PA-C  liver oil-zinc oxide (DESITIN) 40 % ointment Apply topically as needed for irritation. 05/31/20   Eugenie Filler, MD  Magnesium 200 MG TABS Take 1 tablet by mouth daily.    [provider]  nadolol (CORGARD) 20 MG tablet TAKE ONE-HALF TABLET BY MOUTH EVERY DAY Patient taking differently: Take 10 mg by mouth daily. 10/07/19   Mar Daring, PA-C  pantoprazole (PROTONIX) 40 MG tablet TAKE ONE TABLET BY MOUTH TWICE DAILY Patient taking differently: Take 40 mg by mouth 2 (two) times daily. 02/26/20   Mar Daring, PA-C  rifaximin (XIFAXAN) 550 MG TABS tablet Take 1 tablet (550  mg total) by mouth 2 (two) times daily. 05/31/20   Eugenie Filler, MD  risperiDONE (RISPERDAL) 0.25 MG tablet Take 1 tablet (0.25 mg total) by mouth daily as needed (anxiety, agitation). 06/03/20   Merlyn Lot, MD  spironolactone (ALDACTONE) 100 MG tablet TAKE ONE TABLET BY MOUTH EVERY DAY Patient taking differently: Take 100 mg by mouth daily. 03/04/20   Mar Daring, PA-C      VITAL SIGNS:  Blood pressure 113/60, pulse 90, temperature 97.8 F (36.6 C), temperature source Oral, resp. rate 15, height 5' 1"  (1.549 m), weight 78 kg, SpO2 98 %.  PHYSICAL EXAMINATION:  Physical Exam  GENERAL:  80 y.o.-year-old Caucasian female patient lying in the bed with no acute distress.  EYES: Pupils equal, round, reactive to light and accommodation. No scleral icterus. Extraocular muscles intact.  HEENT: Head atraumatic, normocephalic. Oropharynx and nasopharynx clear.  NECK:  Supple, no jugular venous distention. No thyroid enlargement, no tenderness.  LUNGS: Normal breath sounds bilaterally, no wheezing, rales,rhonchi or crepitation. No use of accessory muscles of respiration.  CARDIOVASCULAR: Regular rate and rhythm, S1, S2 normal. No murmurs, rubs, or gallops.  ABDOMEN: Soft, nondistended, nontender. Bowel sounds present. No organomegaly or mass.  EXTREMITIES: No pedal edema, cyanosis, or clubbing.  NEUROLOGIC: Cranial nerves II through XII are intact. Muscle strength 5/5 in all extremities. Sensation intact. Gait not checked.  PSYCHIATRIC: The patient is alert and oriented x 3.  Normal affect and good eye contact. SKIN: No obvious rash, lesion, or ulcer.   LABORATORY PANEL:   CBC Recent Labs  Lab 06/03/20 1859  WBC 4.6  HGB 12.5  HCT 37.3  PLT 67*   ------------------------------------------------------------------------------------------------------------------  Chemistries  Recent Labs  Lab 05/30/20 0455 05/31/20 0229 06/03/20 2009  NA 133*   < > 135  K 3.5   < >  4.0  CL 91*   < > 98  CO2 31   < > 26  GLUCOSE 101*   < > 171*  BUN 23   < > 26*  CREATININE 0.90   < > 0.86  CALCIUM 9.2   < > 9.6  MG 2.2  --   --   AST 35   < > 35  ALT 16   < > 17  ALKPHOS 64   < > 97  BILITOT 1.5*   < > 1.8*   < > = values in this interval not displayed.   ------------------------------------------------------------------------------------------------------------------  Cardiac Enzymes No results for input(s): TROPONINI in the last 168 hours. ------------------------------------------------------------------------------------------------------------------  RADIOLOGY:  CT HEAD WO CONTRAST  Result Date: 06/03/2020 CLINICAL DATA:  Unresponsive, seizure-like activity, confusion, cirrhosis EXAM: CT HEAD WITHOUT CONTRAST TECHNIQUE: Contiguous axial images were obtained from the base of the skull through  the vertex without intravenous contrast. COMPARISON:  06/02/2020 FINDINGS: Brain: No acute infarct or hemorrhage. Lateral ventricles and midline structures are stable. No acute extra-axial fluid collections. No mass effect. Vascular: No hyperdense vessel or unexpected calcification. Skull: Normal. Negative for fracture or focal lesion. Sinuses/Orbits: No acute finding. Other: None. IMPRESSION: 1. Stable head CT, no acute process. Electronically Signed   By: Randa Ngo M.D.   On: 06/03/2020 21:32   CT Head Wo Contrast  Result Date: 06/02/2020 CLINICAL DATA:  Mental status change, unknown cause. Additional history provided: Altered mental status EXAM: CT HEAD WITHOUT CONTRAST TECHNIQUE: Contiguous axial images were obtained from the base of the skull through the vertex without intravenous contrast. COMPARISON:  Prior head CT examinations 05/26/2020. FINDINGS: Brain: Mild cerebral and cerebellar atrophy. There is no acute intracranial hemorrhage. No demarcated cortical infarct. No extra-axial fluid collection. No evidence of intracranial mass. No midline shift. Vascular: No  hyperdense vessel.  Atherosclerotic calcifications. Skull: Normal. Negative for fracture or focal suspicious osseous lesion. Sinuses/Orbits: Visualized orbits show no acute finding. No significant paranasal sinus disease at the imaged levels. IMPRESSION: No evidence of acute intracranial abnormality. Mild generalized atrophy of the brain, stable. Electronically Signed   By: Kellie Simmering DO   On: 06/02/2020 12:19      IMPRESSION AND PLAN:   1.  New onset seizures. -The patient will be admitted to a medical monitored bed. -She will be placed on seizures precautions. -We will continue her on as needed IV Ativan. -Neurology consult will be obtained as well as EEG. -We will obtain a brain MRI without contrast. -I notified Dr. Rory Percy about the patient.  2.  COVID-19 infection. -The patient will be placed on IV remdesivir. -We will monitor pulse oximetry closely. -We will follow inflammatory markers. -The patient will be placed on vitamin C, vitamin D3, zinc sulfate and aspirin.  3.  Essential hypertension. -We will continue her nadolol.  4.  GERD. -We will continue PPI therapy.  5.  Type 2 diabetes mellitus. -We will place the patient on supplement coverage with NovoLog.  6.  DVT prophylaxis. -Subcutaneous Lovenox  All the records are reviewed and case discussed with ED provider. The plan of care was discussed in details with the patient (and family). I answered all questions. The patient agreed to proceed with the above mentioned plan. Further management will depend upon hospital course.   CODE STATUS: Full code  Status is: Inpatient  Remains inpatient appropriate because:Ongoing diagnostic testing needed not appropriate for outpatient work up, Unsafe d/c plan, IV treatments appropriate due to intensity of illness or inability to take PO and Inpatient level of care appropriate due to severity of illness   Dispo: The patient is from: Home              Anticipated d/c is to:  Home              Anticipated d/c date is: 2 days              Patient currently is not medically stable to d/c.   Difficult to place patient No   TOTAL TIME TAKING CARE OF THIS PATIENT: 55 minutes.    Christel Mormon M.D on 06/03/2020 at 10:22 PM  [facility]   From 7 PM-7 AM, contact night-coverage [email or web address]  CC: Primary care physician; Mar Daring, PA-C

## 2020-06-03 NOTE — Telephone Encounter (Signed)
Spoke with son [name]) who states pt is still in the hospital and he is unsure if pt will be d/c today or tomorrow. Son requests HFU be changed to a telephonic call due to pts condition. FYI to PCP

## 2020-06-03 NOTE — ED Notes (Signed)
Pt had an episode of bowel incontinent. Pt taken into room and cleansed at this time. Pt chaffed around gluteal fold. Barrier cream placed on irritation, new brief placed, warm blankets given.

## 2020-06-03 NOTE — ED Notes (Signed)
Dietary contacted to place order for breakfast tray.

## 2020-06-03 NOTE — ED Triage Notes (Signed)
Pt presents via [facility] with c/o episode of unresponsiveness and seizure like activity. Pt family called ems. When ems arrived pt appeared clenched throughout her whole body. Fire gave narcan before ems arrived with no response. EMS gave 4 mg versed in route and patient became responsive. Pt currently alert, but confused. Pt seen here recently and discharged from hospital today. Pt due for hospice consult on Monday.

## 2020-06-03 NOTE — ED Notes (Signed)
Pt daughter contacted regarding pt discharge order. She states she will contact pt son to pick up pt from ED.

## 2020-06-03 NOTE — ED Provider Notes (Signed)
[facility] Emergency Department Provider Note   ____________________________________________   Event Date/Time   First MD Initiated Contact with Patient 06/03/20 1855     (approximate)  I have reviewed the triage vital signs and the nursing notes.   HISTORY  Chief Complaint Seizures    HPI Jennifer [name] is a 80 y.o. female with a past medical history of hypertension, dementia, asthma, GERD who presents via EMS for seizure-like activity.  EMS state that when they found patient she was tonic throughout her whole body and unresponsive.  They initially gave Narcan with no response but patient did become responsive after 4 mg of Versed.  Patient was then fairly confused and has been clearing since then.  Upon my evaluation, patient states "I was discharged from the hospital and then ended back up here and I do remember anything in between".  Further history and review of systems unable to assess at this time         Past Medical History:  Diagnosis Date  . Allergy   . Arthritis   . Asthma   . Colon polyp   . Fatty liver   . GERD (gastroesophageal reflux disease)   . Hyperlipidemia   . Hypertension   . Motion sickness    boats    Patient Active Problem List   Diagnosis Date Noted  . Seizure ([place]) 06/03/2020  . AMS (altered mental status) 05/29/2020  . Acute metabolic encephalopathy   . Ascites   . Hypomagnesemia   . Altered mental status 05/26/2020  . Hyponatremia 05/19/2020  . Chronic hyponatremia 05/19/2020  . Essential hypertension, benign 02/04/2018  . Iron deficiency 02/04/2018  . Pelvic fracture ([place]) 01/31/2018  . Multiple gastric polyps   . Hx of colonic polyps   . Acute upper GI bleed 07/12/2017  . Liver cirrhosis secondary to NASH ([place])   . Carpal tunnel syndrome on right 09/28/2016  . Obesity (BMI 30.0-34.9) 09/20/2016  . Upper respiratory infection 03/17/2016  . Thrombocytopenia ([place]) 12/29/2015  . Benign neoplasm of  ascending colon   . Benign neoplasm of transverse colon   . Benign neoplasm of sigmoid colon   . Allergic rhinitis 10/14/2014  . Airway hyperreactivity 10/14/2014  . Type 2 diabetes mellitus with unspecified complications ([place]) 02/21/5101  . Calculus of gallbladder 10/14/2014  . Esophagitis, reflux 10/14/2014  . Vitamin D deficiency 10/14/2014    Past Surgical History:  Procedure Laterality Date  . ABDOMINAL HYSTERECTOMY  1978  . COLONOSCOPY WITH PROPOFOL N/A 02/01/2015   Procedure: COLONOSCOPY WITH PROPOFOL;  Surgeon: Lucilla Lame, MD;  Location: [place];  Service: Endoscopy;  Laterality: N/A;  Diabetic - oral meds  . COLONOSCOPY WITH PROPOFOL N/A 11/22/2017   Procedure: COLONOSCOPY WITH PROPOFOL;  Surgeon: Lin Landsman, MD;  Location: [facility] ENDOSCOPY;  Service: Gastroenterology;  Laterality: N/A;  . ESOPHAGOGASTRODUODENOSCOPY (EGD) WITH PROPOFOL N/A 07/12/2017   Procedure: ESOPHAGOGASTRODUODENOSCOPY (EGD) WITH PROPOFOL;  Surgeon: Lin Landsman, MD;  Location: [name] ENDOSCOPY;  Service: Gastroenterology;  Laterality: N/A;  . ESOPHAGOGASTRODUODENOSCOPY (EGD) WITH PROPOFOL N/A 08/16/2017   Procedure: ESOPHAGOGASTRODUODENOSCOPY (EGD) WITH PROPOFOL;  Surgeon: Lin Landsman, MD;  Location: [facility] ENDOSCOPY;  Service: Gastroenterology;  Laterality: N/A;  . ESOPHAGOGASTRODUODENOSCOPY (EGD) WITH PROPOFOL N/A 09/20/2017   Procedure: ESOPHAGOGASTRODUODENOSCOPY (EGD) WITH PROPOFOL;  Surgeon: Lin Landsman, MD;  Location: [facility] ENDOSCOPY;  Service: Gastroenterology;  Laterality: N/A;  . ESOPHAGOGASTRODUODENOSCOPY (EGD) WITH PROPOFOL N/A 11/01/2017   Procedure: ESOPHAGOGASTRODUODENOSCOPY (EGD) WITH PROPOFOL;  Surgeon: Marius Ditch,  Tally Due, MD;  Location: [place];  Service: Gastroenterology;  Laterality: N/A;  . ESOPHAGOGASTRODUODENOSCOPY (EGD) WITH PROPOFOL N/A 01/17/2018   Procedure: ESOPHAGOGASTRODUODENOSCOPY (EGD) WITH PROPOFOL;  Surgeon: Lin Landsman, MD;   Location: [facility] ENDOSCOPY;  Service: Gastroenterology;  Laterality: N/A;  . ESOPHAGOGASTRODUODENOSCOPY (EGD) WITH PROPOFOL N/A 06/20/2018   Procedure: ESOPHAGOGASTRODUODENOSCOPY (EGD) WITH PROPOFOL;  Surgeon: Lin Landsman, MD;  Location: [name] ENDOSCOPY;  Service: Gastroenterology;  Laterality: N/A;  EGD/ with banding ligation   . IR RADIOLOGIST EVAL & MGMT  07/09/2018  . POLYPECTOMY  02/01/2015   Procedure: POLYPECTOMY;  Surgeon: Lucilla Lame, MD;  Location: [place];  Service: Endoscopy;;  . TONSILLECTOMY AND ADENOIDECTOMY      Prior to Admission medications   Medication Sig Start Date End Date Taking? Authorizing Provider  albuterol (VENTOLIN HFA) 108 (90 Base) MCG/ACT inhaler Inhale 1-2 puffs into the lungs every 6 (six) hours as needed for wheezing or shortness of breath. 12/05/19   Mar Daring, PA-C  Blood Glucose Monitoring Suppl (ACCU-CHEK AVIVA PLUS) w/Device KIT To check blood sugar daily 03/26/20   [name] M, PA-C  FLUZONE HIGH-DOSE QUADRIVALENT 0.7 ML SUSY  01/22/19   [provider]  furosemide (LASIX) 20 MG tablet TAKE ONE TABLET EVERY DAY Patient taking differently: Take 20 mg by mouth daily. 02/16/20   Mar Daring, PA-C  glucose blood (ACCU-CHEK AVIVA PLUS) test strip To check blood sugar daily 01/28/20   Mar Daring, PA-C  lactulose (CHRONULAC) 10 GM/15ML solution Take 30 mLs (20 g total) by mouth 2 (two) times daily. 05/31/20   Eugenie Filler, MD  Lancets (ACCU-CHEK SOFT [facility]) lancets To check blood sugar daily 12/12/18   Mar Daring, PA-C  liver oil-zinc oxide (DESITIN) 40 % ointment Apply topically as needed for irritation. 05/31/20   Eugenie Filler, MD  Magnesium 200 MG TABS Take 1 tablet by mouth daily.    [provider]  nadolol (CORGARD) 20 MG tablet TAKE ONE-HALF TABLET BY MOUTH EVERY DAY Patient taking differently: Take 10 mg by mouth daily. 10/07/19   Mar Daring, PA-C  pantoprazole  (PROTONIX) 40 MG tablet TAKE ONE TABLET BY MOUTH TWICE DAILY Patient taking differently: Take 40 mg by mouth 2 (two) times daily. 02/26/20   Mar Daring, PA-C  rifaximin (XIFAXAN) 550 MG TABS tablet Take 1 tablet (550 mg total) by mouth 2 (two) times daily. 05/31/20   Eugenie Filler, MD  risperiDONE (RISPERDAL) 0.25 MG tablet Take 1 tablet (0.25 mg total) by mouth daily as needed (anxiety, agitation). 06/03/20   Merlyn Lot, MD  spironolactone (ALDACTONE) 100 MG tablet TAKE ONE TABLET BY MOUTH EVERY DAY Patient taking differently: Take 100 mg by mouth daily. 03/04/20   Mar Daring, PA-C    Allergies Amoxicillin, Codeine, Hydrocodone-acetaminophen, Nitrofurantoin, Nitrofurantoin monohyd macro, Penicillins, and Sulfa antibiotics  Family History  Problem Relation Age of Onset  . Dementia Mother   . Alcohol abuse Father   . Cancer Sister        lung  . Lymphoma Sister   . Melanoma Sister   . Lymphoma Sister     Social History Social History   Tobacco Use  . Smoking status: Former Smoker    Packs/day: 0.50    Years: 6.00    Pack years: 3.00    Types: Cigarettes    Quit date: 05/08/1987    Years since quitting: 33.0  . Smokeless tobacco: Never Used  Vaping Use  .  Vaping Use: Never used  Substance Use Topics  . Alcohol use: Never  . Drug use: Never    Review of Systems Unable to assess ____________________________________________   PHYSICAL EXAM:  VITAL SIGNS: ED Triage Vitals  Enc Vitals Group     BP 06/03/20 1855 106/62     Pulse Rate 06/03/20 1855 92     Resp 06/03/20 1855 17     Temp 06/03/20 1855 97.8 F (36.6 C)     Temp Source 06/03/20 1855 Oral     SpO2 06/03/20 1855 98 %     Weight 06/03/20 1853 171 lb 15.3 oz (78 kg)     Height 06/03/20 1853 5' 1"  (1.549 m)     Head Circumference --      Peak Flow --      Pain Score 06/03/20 1853 0     Pain Loc --      Pain Edu? --      Excl. in [place]? --    Constitutional: Alert and  oriented only to self. Well appearing and in no acute distress. Eyes: Conjunctivae are normal. PERRL. Head: Atraumatic. Nose: No congestion/rhinnorhea. Mouth/Throat: Mucous membranes are moist. Neck: No stridor Cardiovascular: Grossly normal heart sounds.  Good peripheral circulation. Respiratory: Normal respiratory effort.  No retractions. Gastrointestinal: Soft and nontender. No distention. Musculoskeletal: No obvious deformities Neurologic:  Normal speech and language. No gross focal neurologic deficits are appreciated. Skin:  Skin is warm and dry. No rash noted. Psychiatric: Mood and affect are normal. Speech and behavior are normal.  ____________________________________________   LABS (all labs ordered are listed, but only abnormal results are displayed)  Labs Reviewed  CBC WITH DIFFERENTIAL/PLATELET - Abnormal; Notable for the following components:      Result Value   RDW 15.9 (*)    Platelets 67 (*)    Lymphs Abs 0.4 (*)    All other components within normal limits  COMPREHENSIVE METABOLIC PANEL - Abnormal; Notable for the following components:   Glucose, Bld 171 (*)    BUN 26 (*)    Total Protein 5.9 (*)    Albumin 3.2 (*)    Total Bilirubin 1.8 (*)    All other components within normal limits  LACTIC ACID, PLASMA - Abnormal; Notable for the following components:   Lactic Acid, Venous 2.2 (*)    All other components within normal limits  SARS CORONAVIRUS 2 BY RT PCR (HOSPITAL ORDER, [place] [facility])  ETHANOL  AMMONIA  CK  URINE DRUG SCREEN, QUALITATIVE ([facility] ONLY)  CBC  COMPREHENSIVE METABOLIC PANEL  CBG MONITORING, ED   RADIOLOGY  ED MD interpretation: CT of the head without contrast shows no evidence of acute abnormalities including no intracranial hemorrhage, edema, or obvious masses  Official radiology report(s): CT HEAD WO CONTRAST  Result Date: 06/03/2020 CLINICAL DATA:  Unresponsive, seizure-like activity, confusion, cirrhosis  EXAM: CT HEAD WITHOUT CONTRAST TECHNIQUE: Contiguous axial images were obtained from the base of the skull through the vertex without intravenous contrast. COMPARISON:  06/02/2020 FINDINGS: Brain: No acute infarct or hemorrhage. Lateral ventricles and midline structures are stable. No acute extra-axial fluid collections. No mass effect. Vascular: No hyperdense vessel or unexpected calcification. Skull: Normal. Negative for fracture or focal lesion. Sinuses/Orbits: No acute finding. Other: None. IMPRESSION: 1. Stable head CT, no acute process. Electronically Signed   By: Randa Ngo M.D.   On: 06/03/2020 21:32    ____________________________________________   PROCEDURES  Procedure(s) performed (including Critical Care):  .1-3  Lead EKG Interpretation Performed by: Naaman Plummer, MD Authorized by: Naaman Plummer, MD     Interpretation: normal     ECG rate:  90   ECG rate assessment: normal     Rhythm: sinus rhythm     Ectopy: none     Conduction: normal       ____________________________________________   INITIAL IMPRESSION / ASSESSMENT AND PLAN / ED COURSE  As part of my medical decision making, I reviewed the following data within the [place] notes reviewed and incorporated, Labs reviewed, Old chart reviewed, Radiograph reviewed and Notes from prior ED visits reviewed and incorporated        patient presents after recent seizure episode.  Patient had slow return to baseline mental and physical function per EMS. No immunosuppresion hx and had no preceding fever. No history of alcohol abuse or suspicion for toxin ingestion. Unlikely stroke, syncope. Unlikely infectious etiology. No preceding trauma.  Workup: EKG, BMP, POCT glucose (pregnancy test if female) and CT Brain.  Field Interventions: 4 mg Versed ED Interventions: None Consult: Neurology at [facility] recommends inpatient evaluation with MRI and EEG Disposition: Admit to  medicine      ____________________________________________   FINAL CLINICAL IMPRESSION(S) / ED DIAGNOSES  Final diagnoses:  Seizure-like activity [facility])     ED Discharge Orders    None       Note:  This document was prepared using Dragon voice recognition software and may include unintentional dictation errors.   Naaman Plummer, MD 06/03/20 [phone]

## 2020-06-03 NOTE — ED Notes (Signed)
Jennifer [name] wishes to be updated on pt's condition and plan of care: [phone]

## 2020-06-03 NOTE — ED Provider Notes (Addendum)
Patient reassessed.  Oriented person place and.  She has no complaints.  She has requested multiple times to be discharged.  Patient seen by physical therapy and was recommending home health.  Has been given referral to hospice and palliative care.  A bit unclear as to why she was kept over night however at this time she appears appropriate for discharge home.  Family agreeable to come pick patient up.  No additional concerns reported.   Merlyn Lot, MD 06/03/20 [phone]   Patient now agitated seems confused nurse reports concerned that she is hallucinating.  Does have findings of asterixis now.  Patient did not receive her scheduled lactulose and rifaximin last night while she was held in the ER.  I suspect that she is developing hyperammonemia.  She is refusing to take her lactulose.  Given her mild agitation will give calling dose of Haldol, give her lactulose and reassess.   Merlyn Lot, MD 06/03/20 [phone]   Patient calm cooperative again requesting discharge home.  States that she does suffer from anxiety but does not have any prescription for anxiety medication.  Patient reported not a candidate for hospice home.  Palliative care will try to initiate hospice at home.  Patient refusing transfer to SNF.  Due to her anxiety will prescribe very low-dose Risperdal which may help with her symptoms at home.  I do not see indication for hospitalization at this time.  Family agreeable to taking her home.   Merlyn Lot, MD 06/03/20 1245

## 2020-06-04 ENCOUNTER — Inpatient Hospital Stay: Payer: Medicare HMO

## 2020-06-04 ENCOUNTER — Telehealth: Payer: Self-pay | Admitting: Physician Assistant

## 2020-06-04 DIAGNOSIS — R569 Unspecified convulsions: Principal | ICD-10-CM

## 2020-06-04 DIAGNOSIS — K7469 Other cirrhosis of liver: Secondary | ICD-10-CM

## 2020-06-04 DIAGNOSIS — U071 COVID-19: Secondary | ICD-10-CM

## 2020-06-04 LAB — CBC
HCT: 30.4 % — ABNORMAL LOW (ref 36.0–46.0)
Hemoglobin: 10.4 g/dL — ABNORMAL LOW (ref 12.0–15.0)
MCH: 31.8 pg (ref 26.0–34.0)
MCHC: 34.2 g/dL (ref 30.0–36.0)
MCV: 93 fL (ref 80.0–100.0)
Platelets: 60 10*3/uL — ABNORMAL LOW (ref 150–400)
RBC: 3.27 MIL/uL — ABNORMAL LOW (ref 3.87–5.11)
RDW: 15.9 % — ABNORMAL HIGH (ref 11.5–15.5)
WBC: 3.2 10*3/uL — ABNORMAL LOW (ref 4.0–10.5)
nRBC: 0 % (ref 0.0–0.2)

## 2020-06-04 LAB — LACTATE DEHYDROGENASE: LDH: 170 U/L (ref 98–192)

## 2020-06-04 LAB — TROPONIN I (HIGH SENSITIVITY)
Troponin I (High Sensitivity): 107 ng/L
Troponin I (High Sensitivity): 88 ng/L — ABNORMAL HIGH

## 2020-06-04 LAB — BRAIN NATRIURETIC PEPTIDE: B Natriuretic Peptide: 103.9 pg/mL — ABNORMAL HIGH (ref 0.0–100.0)

## 2020-06-04 LAB — C-REACTIVE PROTEIN: CRP: 0.6 mg/dL

## 2020-06-04 LAB — PROCALCITONIN: Procalcitonin: <0.1 ng/mL

## 2020-06-04 LAB — FIBRINOGEN: Fibrinogen: 167 mg/dL — ABNORMAL LOW (ref 210–475)

## 2020-06-04 LAB — FIBRIN DERIVATIVES D-DIMER (ARMC ONLY): Fibrin derivatives D-dimer (ARMC): 1498.85 ng/mL (FEU) — ABNORMAL HIGH (ref 0.00–499.00)

## 2020-06-04 LAB — FERRITIN: Ferritin: 47 ng/mL (ref 11–307)

## 2020-06-04 MED ORDER — ZINC OXIDE 40 % EX OINT
TOPICAL_OINTMENT | CUTANEOUS | Status: AC
  Filled 2020-06-04: qty 113

## 2020-06-04 MED ORDER — LEVETIRACETAM 500 MG PO TABS
500.0000 mg | ORAL_TABLET | Freq: Two times a day (BID) | ORAL | Status: DC
  Administered 2020-06-04 – 2020-06-07 (×3): 500 mg via ORAL
  Filled 2020-06-04 (×10): qty 1

## 2020-06-04 MED ORDER — ASCORBIC ACID 500 MG PO TABS
500.0000 mg | ORAL_TABLET | Freq: Every day | ORAL | Status: DC
  Administered 2020-06-04 – 2020-06-07 (×4): 500 mg via ORAL
  Filled 2020-06-04 (×5): qty 1

## 2020-06-04 MED ORDER — SODIUM CHLORIDE 0.9 % IV SOLN
200.0000 mg | Freq: Once | INTRAVENOUS | Status: AC
  Administered 2020-06-04: 200 mg via INTRAVENOUS
  Filled 2020-06-04: qty 40

## 2020-06-04 MED ORDER — VITAMIN D 25 MCG (1000 UNIT) PO TABS
1000.0000 [IU] | ORAL_TABLET | Freq: Every day | ORAL | Status: DC
  Administered 2020-06-04 – 2020-06-07 (×4): 1000 [IU] via ORAL
  Filled 2020-06-04 (×5): qty 1

## 2020-06-04 MED ORDER — ZINC SULFATE 220 (50 ZN) MG PO CAPS
220.0000 mg | ORAL_CAPSULE | Freq: Every day | ORAL | Status: DC
  Administered 2020-06-04 – 2020-06-07 (×3): 220 mg via ORAL
  Filled 2020-06-04 (×4): qty 1

## 2020-06-04 MED ORDER — LACTULOSE 10 GM/15ML PO SOLN
10.0000 g | Freq: Every day | ORAL | Status: DC
  Administered 2020-06-05 – 2020-06-06 (×2): 10 g via ORAL
  Filled 2020-06-04 (×5): qty 30

## 2020-06-04 MED ORDER — SODIUM CHLORIDE 0.9 % IV SOLN: 100.0000 mg | Freq: Every day | INTRAVENOUS | Status: DC

## 2020-06-04 MED ORDER — LOPERAMIDE HCL 2 MG PO CAPS
2.0000 mg | ORAL_CAPSULE | Freq: Four times a day (QID) | ORAL | Status: DC | PRN
  Filled 2020-06-04: qty 1

## 2020-06-04 NOTE — Telephone Encounter (Signed)
Patient has been back to ER and per pallative care she will not be in Hospice.

## 2020-06-04 NOTE — Procedures (Signed)
Patient Name: Jennifer [name]  MRN: [id number]  Epilepsy Attending: Lora Havens  Referring Physician/Provider: Dr Eugenie Norrie Date: 06/04/2020 Duration: 22.18 mins  Patient history: 80yo F with multiple seizures in the setting of hyponatremia secondary to liver disease. EEG to evaluate for seizure  Level of alertness: Awake,  asleep  AEDs during EEG study: LEV  Technical aspects: This EEG study was done with scalp electrodes positioned according to the 10-20 International system of electrode placement. Electrical activity was acquired at a sampling rate of 500Hz  and reviewed with a high frequency filter of 70Hz  and a low frequency filter of 1Hz . EEG data were recorded continuously and digitally stored.   Description: The posterior dominant rhythm consists of 7 Hz activity of moderate voltage (25-35 uV) seen predominantly in posterior head regions, symmetric and reactive to eye opening and eye closing. Sleep was characterized by vertex waves, sleep spindles (12 to 14 Hz), maximal frontocentral region. Hyperventilation and photic stimulation were not performed.     ABNORMALITY - Background slow  IMPRESSION: This study is suggestive of mild diffuse encephalopathy, nonspecific etiology. No seizures or epileptiform discharges were seen throughout the recording.   Christal Lagerstrom Barbra Sarks

## 2020-06-04 NOTE — Procedures (Signed)
Nurse asked I wait to do eeg until pt gets to her room-114.

## 2020-06-04 NOTE — TOC Initial Note (Signed)
Transition of Care [facility]) - Progression Note    Patient Details  Name: Jennifer [name] MRN: [id number] Date of Birth: Feb 03, 1941  Transition of Care [facility]) CM/SW [place], [place] Phone Number: [phone] 06/04/2020, 3:20 PM  Clinical Narrative:     CSW received call from [facility], stating she received an APS report. CSW gave Magda Paganini the patient's current room number.       Expected Discharge Plan and Services                                                 Social Determinants of Health (SDOH) Interventions    Readmission Risk Interventions No flowsheet data found.

## 2020-06-04 NOTE — ED Notes (Signed)
Pt had BM in brief.  When RN walked into room. Pt had kicked off covers, and was lying on her left side.  Pt cleaned of stool and bed linens changed at this time.  Pt's blankets reapplied and pt not resting comfortably.  NAD noted.  RN will continue to monitor.

## 2020-06-04 NOTE — Plan of Care (Signed)
Updated daughter via phone that pt arrived to unit, rom#, all questions answered.

## 2020-06-04 NOTE — TOC Progression Note (Signed)
Transition of Care [facility]) - Progression Note    Patient Details  Name: Jennifer [name] MRN: [id number] Date of Birth: 1941-02-17  Transition of Care [facility]) CM/SW Contact  Shelbie Hutching, RN Phone Number: 06/04/2020, 4:11 PM  Clinical Narrative:    Patient followed by APS, case worker is [place] Ar [phone].  There have been concerns about family not being able to care for patient properly.  Patient has been brought back to the hospital 3 times within the past 4 days.         Expected Discharge Plan and Services                                                 Social Determinants of Health (SDOH) Interventions    Readmission Risk Interventions No flowsheet data found.

## 2020-06-04 NOTE — ED Notes (Signed)
Pt returned from MRI at this time

## 2020-06-04 NOTE — Progress Notes (Signed)
Remdesivir - Pharmacy Brief Note   O:  CXR: No CXR available at this time. SpO2: 95-98% on RA   A/P:  Remdesivir 200 mg IVPB once followed by 100 mg IVPB daily x 4 days.   Renda Rolls, PharmD, MBA 06/04/2020 1:25 AM

## 2020-06-04 NOTE — ED Notes (Signed)
Pt cleaned of loose BM. Peri care preformed. Chucks changed

## 2020-06-04 NOTE — ED Notes (Signed)
Lab called and requested to come draw Jennifer [name] labs for pt.

## 2020-06-04 NOTE — Progress Notes (Addendum)
Pt returned from EEG. Pt has new skin tears on L forearm and back of hand. Pt refuses to open eyes and states "im not going to do anything" when I ask her questions. VSS. MD notified. No new orders, will continue to monitor.   Skin tears dressing with vaseline gauze and mepalex foam.

## 2020-06-04 NOTE — Telephone Encounter (Signed)
Tammy call with [facility] is calling to ask will Tawanna Sat be able to be the attending MD for the patient. Please advise Cb- [phone]

## 2020-06-04 NOTE — ED Notes (Signed)
Patient cleaned of bowel incontinence. MD paged for anti-diarrheal medication. Chuck changed, peri-care preformed.

## 2020-06-04 NOTE — Consult Note (Signed)
Neurology Consultation  Reason for Consult: Seizure Referring Physician: Dr. Sidney Ace, [facility]  CC: Seizure  History is obtained from: Patient, daughter, chart  HPI: Jennifer [name] is a 80 y.o. female has a past medical history of hypertension, dementia, asthma, cirrhosis, brought into the hospital for acute onset of unresponsiveness and seizure-like activity. She was recently discharged after treatment of hyponatremia from the hospital.  Her lowest sodium level was 107-and to the best of my chart review, the cause of hyponatremia is likely secondary to the cirrhosis. She was at home and on discharge, was recommended to take her medications that she took to medications including rifaximin as well as risperidone and after that the family noticed that she became unresponsive with eyes closed and her face appeared to be clenched.  Lasted for about 5 to 8 minutes.  EMS was called.  Gave her Narcan with no response and then given 4 mg of IV Versed after which she became some more responsive.  She still appeared confused and was interviewed and evaluated by the hospitalist, when she was fairly somnolent but arousable and responding appropriately to questions.  There was stool incontinence during this process.  She has slowly been coming around is now back to her baseline according to her and family. She has received both Covid vaccines as well as a booster shot. Her RT-PCR yesterday for COVID-19 came back positive. She denies any respiratory symptoms Denies any history of seizures in the past but the family reports that for the past 4 weeks or so she has been having difficulty with tremors-these are relatively new and have stayed on unchanged for the past 4 weeks. Patient has had not much in terms of her cognitive decline subacutely but had some issues ever since she has been in and out of the hospital due to hyponatremia.  Up until a month ago, she was also driving.  Takes care of her bills and  finances herself and has had no issues with it until last month.   According to the daughter she has had multiple seizure-like episodes since the hyponatremia spells and episodes happened over the last month or so.  In the ED, brain MRI was unrevealing for any acute process. Blood work revealed hyperglycemia and troponinemia.  With elevated lactate.   ROS: Performed and negative except as noted in HPI  Past Medical History:  Diagnosis Date  . Allergy   . Arthritis   . Asthma   . Colon polyp   . Fatty liver   . GERD (gastroesophageal reflux disease)   . Hyperlipidemia   . Hypertension   . Motion sickness    boats        Family History  Problem Relation Age of Onset  . Dementia Mother   . Alcohol abuse Father   . Cancer Sister        lung  . Lymphoma Sister   . Melanoma Sister   . Lymphoma Sister      Social History:   reports that she quit smoking about 33 years ago. Her smoking use included cigarettes. She has a 3.00 pack-year smoking history. She has never used smokeless tobacco. She reports that she does not drink alcohol and does not use drugs.  Medications  Current Facility-Administered Medications:  .  0.9 %  sodium chloride infusion, 75 mL/hr, Intravenous, Continuous, Mansy, Jan A, MD, Last Rate: 75 mL/hr at 06/03/20 2348, 75 mL/hr at 06/03/20 2348 .  acetaminophen (TYLENOL) tablet 650 mg, 650 mg,  Oral, Q4H PRN **OR** acetaminophen (TYLENOL) suppository 650 mg, 650 mg, Rectal, Q4H PRN, Mansy, Jan A, MD .  albuterol (VENTOLIN HFA) 108 (90 Base) MCG/ACT inhaler 1-2 puff, 1-2 puff, Inhalation, Q6H PRN, Mansy, Jan A, MD .  ascorbic acid (VITAMIN C) tablet 500 mg, 500 mg, Oral, Daily, Mansy, Jan A, MD, 500 mg at 06/04/20 1036 .  cholecalciferol (VITAMIN D3) tablet 1,000 Units, 1,000 Units, Oral, Daily, Mansy, Jan A, MD, 1,000 Units at 06/04/20 1036 .  furosemide (LASIX) tablet 20 mg, 20 mg, Oral, Daily, Mansy, Jan A, MD, 20 mg at 06/04/20 1036 .  lactulose  (CHRONULAC) 10 GM/15ML solution 20 g, 20 g, Oral, BID, Mansy, Jan A, MD .  LORazepam (ATIVAN) injection 1-2 mg, 1-2 mg, Intravenous, Q2H PRN, Mansy, Jan A, MD .  magnesium hydroxide (MILK OF MAGNESIA) suspension 30 mL, 30 mL, Oral, Daily PRN, Mansy, Jan A, MD .  magnesium oxide (MAG-OX) tablet 200 mg, 200 mg, Oral, Daily, Mansy, Jan A, MD, 200 mg at 06/04/20 1035 .  nadolol (CORGARD) tablet 10 mg, 10 mg, Oral, Daily, Mansy, Jan A, MD, 10 mg at 06/04/20 1036 .  ondansetron (ZOFRAN) tablet 4 mg, 4 mg, Oral, Q6H PRN **OR** ondansetron (ZOFRAN) injection 4 mg, 4 mg, Intravenous, Q6H PRN, Mansy, Jan A, MD .  pantoprazole (PROTONIX) EC tablet 40 mg, 40 mg, Oral, BID, Mansy, Jan A, MD, 40 mg at 06/04/20 1036 .  [COMPLETED] remdesivir 200 mg in sodium chloride 0.9% 250 mL IVPB, 200 mg, Intravenous, Once, Stopped at 06/04/20 0302 **FOLLOWED BY** [START ON 06/05/2020] remdesivir 100 mg in sodium chloride 0.9 % 100 mL IVPB, 100 mg, Intravenous, Daily, Mansy, Jan A, MD .  rifaximin Doreene Nest) tablet 550 mg, 550 mg, Oral, BID, Mansy, Jan A, MD, 550 mg at 06/04/20 1035 .  risperiDONE (RISPERDAL) tablet 0.25 mg, 0.25 mg, Oral, Daily PRN, Mansy, Jan A, MD .  spironolactone (ALDACTONE) tablet 100 mg, 100 mg, Oral, Daily, Mansy, Jan A, MD, 100 mg at 06/04/20 1036 .  traZODone (DESYREL) tablet 25 mg, 25 mg, Oral, QHS PRN, Mansy, Jan A, MD .  zinc sulfate capsule 220 mg, 220 mg, Oral, Daily, Mansy, Jan A, MD, 220 mg at 06/04/20 1035  Current Outpatient Medications:  .  albuterol (VENTOLIN HFA) 108 (90 Base) MCG/ACT inhaler, Inhale 1-2 puffs into the lungs every 6 (six) hours as needed for wheezing or shortness of breath., Disp: 18 g, Rfl: 1 .  furosemide (LASIX) 20 MG tablet, TAKE ONE TABLET EVERY DAY (Patient taking differently: Take 20 mg by mouth daily.), Disp: 90 tablet, Rfl: 1 .  lactulose (CHRONULAC) 10 GM/15ML solution, Take 30 mLs (20 g total) by mouth 2 (two) times daily., Disp: 1892 mL, Rfl: 1 .  liver  oil-zinc oxide (DESITIN) 40 % ointment, Apply topically as needed for irritation., Disp: 56.7 g, Rfl: 0 .  Magnesium 200 MG TABS, Take 1 tablet by mouth daily., Disp: , Rfl:  .  nadolol (CORGARD) 20 MG tablet, TAKE ONE-HALF TABLET BY MOUTH EVERY DAY (Patient taking differently: Take 10 mg by mouth daily.), Disp: 45 tablet, Rfl: 3 .  pantoprazole (PROTONIX) 40 MG tablet, TAKE ONE TABLET BY MOUTH TWICE DAILY (Patient taking differently: Take 40 mg by mouth 2 (two) times daily.), Disp: 180 tablet, Rfl: 0 .  rifaximin (XIFAXAN) 550 MG TABS tablet, Take 1 tablet (550 mg total) by mouth 2 (two) times daily., Disp: 60 tablet, Rfl: 1 .  spironolactone (ALDACTONE) 100 MG tablet, TAKE ONE TABLET  BY MOUTH EVERY DAY (Patient taking differently: Take 100 mg by mouth daily.), Disp: 90 tablet, Rfl: 1 .  Blood Glucose Monitoring Suppl (ACCU-CHEK AVIVA PLUS) w/Device KIT, To check blood sugar daily, Disp: 1 kit, Rfl: 0 .  FLUZONE HIGH-DOSE QUADRIVALENT 0.7 ML SUSY, , Disp: , Rfl:  .  glucose blood (ACCU-CHEK AVIVA PLUS) test strip, To check blood sugar daily, Disp: 100 each, Rfl: 3 .  Lancets (ACCU-CHEK SOFT TOUCH) lancets, To check blood sugar daily, Disp: 100 each, Rfl: 12 .  risperiDONE (RISPERDAL) 0.25 MG tablet, Take 1 tablet (0.25 mg total) by mouth daily as needed (anxiety, agitation)., Disp: 20 tablet, Rfl: 0   Exam: Current vital signs: BP 126/70   Pulse 91   Temp 97.8 F (36.6 C) (Oral)   Resp 18   Ht 5' 1"  (9.381 m)   Wt 78 kg   SpO2 99%   BMI 32.49 kg/m  Vital signs in last 24 hours: Temp:  [97.8 F (36.6 C)] 97.8 F (36.6 C) (01/27 1855) Pulse Rate:  [76-96] 91 (01/28 1000) Resp:  [14-22] 18 (01/28 1000) BP: (91-127)/(40-76) 126/70 (01/28 1000) SpO2:  [95 %-99 %] 99 % (01/28 1000) Weight:  [78 kg] 78 kg (01/27 1853) GENERAL: Awake, alert in NAD HEENT: - Normocephalic and atraumatic, dry mm, no LN++, no Thyromegally LUNGS - Clear to auscultation bilaterally with no wheezes CV - S1S2  RRR, no m/r/g, equal pulses bilaterally. ABDOMEN - Soft, nontender, nondistended with normoactive BS Ext: warm, well perfused, intact peripheral pulses, __ edema NEURO:  Mental Status: AA&Ox3 Language: speech is nondysarthric.  Naming, repetition, fluency, and comprehension intact. Cranial Nerves: PERRL. EOMI, visual fields full, no facial asymmetry, facial sensation intact, hearing intact, tongue/uvula/soft palate midline, normal sternocleidomastoid and trapezius muscle strength. No evidence of tongue atrophy or fibrillations Motor: 5/5 with no vertical drift in any of the 4 extremities.  There is a resting tremor with cogwheeling in both upper extremities. Sensation- Intact to light touch bilaterally Coordination: No gross dysmetria but tremor with cogwheeling as noted above. Gait- deferred  NIHSS-0    Labs I have reviewed labs in epic and the results pertinent to this consultation are:   CBC    Component Value Date/Time   WBC 3.2 (L) 06/04/2020 0240   RBC 3.27 (L) 06/04/2020 0240   HGB 10.4 (L) 06/04/2020 0240   HGB 11.5 12/05/2019 0856   HCT 30.4 (L) 06/04/2020 0240   HCT 33.6 (L) 12/05/2019 0856   PLT 60 (L) 06/04/2020 0240   PLT 52 (LL) 12/05/2019 0856   MCV 93.0 06/04/2020 0240   MCV 88 12/05/2019 0856   MCH 31.8 06/04/2020 0240   MCHC 34.2 06/04/2020 0240   RDW 15.9 (H) 06/04/2020 0240   RDW 12.8 12/05/2019 0856   LYMPHSABS 0.4 (L) 06/03/2020 1859   LYMPHSABS 0.5 (L) 12/05/2019 0856   MONOABS 0.3 06/03/2020 1859   EOSABS 0.0 06/03/2020 1859   EOSABS 0.1 12/05/2019 0856   BASOSABS 0.0 06/03/2020 1859   BASOSABS 0.0 12/05/2019 0856    CMP     Component Value Date/Time   NA 135 06/03/2020 2009   NA 129 (L) 12/05/2019 0856   K 4.0 06/03/2020 2009   CL 98 06/03/2020 2009   CO2 26 06/03/2020 2009   GLUCOSE 171 (H) 06/03/2020 2009   BUN 26 (H) 06/03/2020 2009   BUN 10 12/05/2019 0856   CREATININE 0.86 06/03/2020 2009   CREATININE 0.98 (H) 07/09/2018 1057    CALCIUM 9.6 06/03/2020  2009   PROT 5.9 (L) 06/03/2020 2009   PROT 6.5 12/05/2019 0856   ALBUMIN 3.2 (L) 06/03/2020 2009   ALBUMIN 3.8 12/05/2019 0856   AST 35 06/03/2020 2009   ALT 17 06/03/2020 2009   ALKPHOS 97 06/03/2020 2009   BILITOT 1.8 (H) 06/03/2020 2009   BILITOT 1.3 (H) 12/05/2019 0856   GFRNONAA >60 06/03/2020 2009   GFRNONAA 55 (L) 07/09/2018 1057   GFRAA >60 01/23/2020 1002   GFRAA 64 07/09/2018 1057    Lipid Panel     Component Value Date/Time   CHOL 178 12/05/2019 0856   TRIG 68 12/05/2019 0856   HDL 54 12/05/2019 0856   CHOLHDL 3.3 12/05/2019 0856   LDLCALC 111 (H) 12/05/2019 0856     Imaging I have reviewed the images obtained:  MRI head 06/04/2020-no acute changes.  No stroke.  No bleed.  No evidence of abnormal increased signal in the cortex or deep gray matter.  Assessment:  80 year old woman past history of hypertension, dementia, asthma, cirrhosis brought her to the hospital for acute onset of unresponsiveness and seizure-like activity. In the last 1 month or so, she has had significant problems with hyponatremia, most likely related to her cirrhosis and the family reports that she has had more than one episode of seizure-like activity in the past 1 month. My examination this morning revealed some cogwheeling and tremors in her upper and lower extremities but otherwise was nonfocal.  Has risperidone on her medication list, which is a new medication.  Based on the history that I could elicit from the daughter, she has not had any subtle or subacute cognitive decline and this seems to be much more acute since the hyponatremia.  Family does not report any behavioral issues.  I am not truly convinced that she has primary seizure disorder but given the fact that she is at high risk for having wide metabolic/electrolyte fluctuations, consideration definitely should be made to start an antiepileptic. The rather sudden change in mentation and cognition can most  definitely be related to the hyponatremia and liver function abnormalities although her ammonia levels are completely normal.  Medication such as trazodone and risperidone might also be contributing.  Also she is incidentally positive for COVID-19.  Other uncommon diagnoses such as CJD should only be considered if symptoms do not improve after correcting toxic metabolic derangements and optimizing medications.  Impression: Multiple seizures in the setting of hyponatremia secondary to liver disease Some concern for short-term memory loss in the last 1 month during the acute sickness with hyponatremia Tremors and cogwheeling-relatively new in the setting of the above COVID-19 infection-likely incidental  Recommendations: -Management of hyponatremia per primary team-currently sodium is in normal level. -Management of liver disease per primary team-no evidence of hyperammonemia. -From a seizure perspective: Initially when I discussed with the daughter, I was not too much in favor of initiating antiepileptics but given the fact that she has had multiple seizures and is prone for electrolyte abnormalities, I would recommend starting her on Keppra.  I would start Keppra 500 twice daily. -Routine EEG  -I will discontinue the trazodone-can lower seizure threshold -I will also recommend discontinuing risperidone-the extra paramedical side effects might be because of the antipsychotic. -Further work-up only if adequate correction of the toxic metabolic derangements and initiation of antiepileptics does not resolve the recurrent seizures.  Discussed my plan with Dr. Posey Pronto.  -- Amie Portland, MD Neurologist [facility] Pager: [phone]

## 2020-06-04 NOTE — ED Notes (Signed)
Assisted patient with bedpan. Medium sized liquid BM. Peri care preformed.

## 2020-06-04 NOTE — Telephone Encounter (Signed)
Left detailed message advising as below.

## 2020-06-04 NOTE — ED Notes (Signed)
Pt has a small bowel movement. Took pt off bed pan, provided peri care, made sure purewick was clean and in place. Call bell within reach. No other needs at this time.

## 2020-06-04 NOTE — Progress Notes (Signed)
eeg done °

## 2020-06-04 NOTE — ED Notes (Signed)
Pt cleaned of urine, brief changed, linens dry, barrier cream placed and Purwick in position.  Call bell in reach, stretcher locked in low position.  Will continue to monitor.

## 2020-06-04 NOTE — ED Notes (Signed)
Called to room by pt, IV has become dislodged and is leaking in bed.  Pt cleaned, bandage placed.  IV team consult placed as pt is difficult stick and has several areas of bruising noted on both arms.

## 2020-06-04 NOTE — Progress Notes (Signed)
[facility] [place] NAME: Jennifer [name]    MR#:  [id number]  DATE OF BIRTH:  1941/01/04  SUBJECTIVE:   Seen in the emergency room. Patient is a calm and pleasant. Complains of tremors tells me her son gave him extra dose of medication that she was discharged on the other day from the emergency room. Denies any other symptoms.  She tells me she lives by herself son lives behind her house and she is able to ambulate REVIEW OF SYSTEMS:   Review of Systems  Constitutional: Negative for chills, fever and weight loss.  HENT: Negative for ear discharge, ear pain and nosebleeds.   Eyes: Negative for blurred vision, pain and discharge.  Respiratory: Negative for sputum production, shortness of breath, wheezing and stridor.   Cardiovascular: Negative for chest pain, palpitations, orthopnea and PND.  Gastrointestinal: Negative for abdominal pain, diarrhea, nausea and vomiting.  Genitourinary: Negative for frequency and urgency.  Musculoskeletal: Negative for back pain and joint pain.  Neurological: Positive for weakness. Negative for sensory change, speech change and focal weakness.  Psychiatric/Behavioral: Negative for depression and hallucinations. The patient is not nervous/anxious.    Tolerating Diet: Tolerating PT:   DRUG ALLERGIES:   Allergies  Allergen Reactions  . Amoxicillin Other (See Comments)    Yeast infections  . Codeine Other (See Comments)    Pt denies  . Hydrocodone-Acetaminophen Other (See Comments)    Pt denies  . Nitrofurantoin Other (See Comments)  . Nitrofurantoin Monohyd Macro     "Sugar got high"  . Penicillins     Has patient had a PCN reaction causing immediate rash, facial/tongue/throat swelling, SOB or lightheadedness with hypotension: Unknown Has patient had a PCN reaction causing severe rash involving mucus membranes or skin necrosis: Unknown Has patient had a PCN reaction that required hospitalization:  Unknown Has patient had a PCN reaction occurring within the last 10 years: Unknown If all of the above answers are "NO", then may proceed with Cephalosporin use.  . Sulfa Antibiotics Other (See Comments)    VITALS:  Blood pressure 129/66, pulse 75, temperature 97.8 F (36.6 C), temperature source Oral, resp. rate 18, height 5' 1"  (1.549 m), weight 78 kg, SpO2 96 %.  PHYSICAL EXAMINATION:   Physical Exam  GENERAL:  80 y.o.-year-old patient lying in the bed with no acute distress.  LUNGS: Normal breath sounds bilaterally, no wheezing, rales, rhonchi. No use of accessory muscles of respiration.  CARDIOVASCULAR: S1, S2 normal. No murmurs, rubs, or gallops.  ABDOMEN: Soft, nontender, nondistended. Bowel sounds present. No organomegaly or mass.  EXTREMITIES: No cyanosis, clubbing or edema b/l.    NEUROLOGIC: Cranial nerves II through XII are intact. No focal Motor or sensory deficits b/l.   PSYCHIATRIC:  patient is alert and oriented x 2.  SKIN: No obvious rash, lesion, or ulcer.   LABORATORY PANEL:  CBC Recent Labs  Lab 06/04/20 0240  WBC 3.2*  HGB 10.4*  HCT 30.4*  PLT 60*    Chemistries  Recent Labs  Lab 05/30/20 0455 05/31/20 0229 06/03/20 2009  NA 133*   < > 135  K 3.5   < > 4.0  CL 91*   < > 98  CO2 31   < > 26  GLUCOSE 101*   < > 171*  BUN 23   < > 26*  CREATININE 0.90   < > 0.86  CALCIUM 9.2   < > 9.6  MG 2.2  --   --  AST 35   < > 35  ALT 16   < > 17  ALKPHOS 64   < > 97  BILITOT 1.5*   < > 1.8*   < > = values in this interval not displayed.   Cardiac Enzymes No results for input(s): TROPONINI in the last 168 hours. RADIOLOGY:  CT HEAD WO CONTRAST  Result Date: 06/03/2020 CLINICAL DATA:  Unresponsive, seizure-like activity, confusion, cirrhosis EXAM: CT HEAD WITHOUT CONTRAST TECHNIQUE: Contiguous axial images were obtained from the base of the skull through the vertex without intravenous contrast. COMPARISON:  06/02/2020 FINDINGS: Brain: No acute  infarct or hemorrhage. Lateral ventricles and midline structures are stable. No acute extra-axial fluid collections. No mass effect. Vascular: No hyperdense vessel or unexpected calcification. Skull: Normal. Negative for fracture or focal lesion. Sinuses/Orbits: No acute finding. Other: None. IMPRESSION: 1. Stable head CT, no acute process. Electronically Signed   By: Randa Ngo M.D.   On: 06/03/2020 21:32   MR BRAIN WO CONTRAST  Result Date: 06/04/2020 CLINICAL DATA:  Seizure EXAM: MRI HEAD WITHOUT CONTRAST TECHNIQUE: Multiplanar, multiecho pulse sequences of the brain and surrounding structures were obtained without intravenous contrast. COMPARISON:  None. FINDINGS: Brain: No acute infarct, mass effect or extra-axial collection. No acute or chronic hemorrhage. Normal white matter signal, parenchymal volume and CSF spaces. The midline structures are normal. The hippocampi are normal and symmetric in size and signal. The hypothalamus and mamillary bodies are normal. There is no cortical ectopia or dysplasia. Vascular: Major flow voids are preserved. Skull and upper cervical spine: Normal calvarium and skull base. Visualized upper cervical spine and soft tissues are normal. Sinuses/Orbits:No paranasal sinus fluid levels or advanced mucosal thickening. No mastoid or middle ear effusion. Normal orbits. IMPRESSION: Normal MRI of the brain. No seizure etiology identified. Electronically Signed   By: Ulyses Jarred M.D.   On: 06/04/2020 01:24   ASSESSMENT AND PLAN:  Jennifer [name] is a 80 y.o. female has a past medical history of hypertension, dementia, asthma, cirrhosis, brought into the hospital for acute onset of unresponsiveness and seizure-like activity. She was recently discharged after treatment of hyponatremia from the hospital.  Her lowest sodium level was 107.Hyponatremia is likely secondary to the cirrhosis.she took to medications including rifaximin as well as risperidone and after that the family  noticed that she became unresponsive with eyes closed and her face appeared to be clenched.  Lasted for about 5 to 8 minutes.  EMS was called  Suspected New onset seizures. -She will be placed on seizures precautions. -l continue her on as needed IV Ativan. -Neurology consult with Dr Rory Percy appreciated  --EEG pending -- no seizures or tremors so far. -MRI without contrast-negative -- was started empirically on Keppra 500 BID  COVID-19 infection-- incidental -- patient was tested negative on 19th 23rd and 26th January during her hospital and ER visit. She tested positive on the 27th. -- Patient is asymptomatic. CRP is .4. No indication for treatment.  Cirrhosis of liver due to NASH with history of severe hyponatremia -- continue lactulose, rifaximin and nadolol -- patient's daughter [name] reports they're not able to afford Rifaximin--will have TOC look into it -- sodium is normal.   GERD. -continue PPI therapy.   Type 2 diabetes mellitus. - place the patient on supplement coverage with NovoLog.   DVT prophylaxis. -Subcutaneous Lovenox   CODE STATUS: Full code  Status is: Inpatient  Remains inpatient appropriate because:Ongoing diagnostic testing needed not appropriate for outpatient work up, Unsafe  d/c plan  Dispo: The patient is from: Home  Anticipated d/c is to: Home  Anticipated d/c date is: 1-2 days  Patient currently is not medically stable to d/c.              Difficult to place patient No  Family communication : [name] daughter on the phone Level of care: Med-Surg Status is: Inpatient  Monitor for seizures another day. Started on antiseizure meds.          TOTAL TIME TAKING CARE OF THIS PATIENT: 25 minutes.  >50% time spent on counselling and coordination of care  Note: This dictation was prepared with Dragon dictation along with smaller phrase technology. Any transcriptional errors that result from this process  are unintentional.  Fritzi Mandes M.D    [facility]   CC: Primary care physician; Mar Daring, PA-CPatient ID: [name], female   DOB: Sep 24, 1940, 80 y.o.   MRN: [id number]

## 2020-06-04 NOTE — ED Notes (Signed)
Report given to floor RN

## 2020-06-05 NOTE — Progress Notes (Signed)
Patient ID: Jennifer [name], female   DOB: Sep 22, 1940, 80 y.o.   MRN: [id number]  Discussed with TOC. APS report has been filed-- unsure who dated. Will keep patient until Monday till APS can evaluate patient's safe discharge plan.

## 2020-06-05 NOTE — Plan of Care (Signed)
  Problem: Activity: Goal: Risk for activity intolerance will decrease Outcome: Progressing  Pt wanted to sleep in recliner; chair alarm on Problem: Pain Managment: Goal: General experience of comfort will improve Outcome: Progressing   Problem: Safety: Goal: Ability to remain free from injury will improve Outcome: Progressing

## 2020-06-05 NOTE — Progress Notes (Signed)
Updated pt's daughter via phone.

## 2020-06-05 NOTE — Progress Notes (Signed)
Neurology Progress Note   S:// Seen and examined. No new complaints. Reports feeling well. No sz.    O:// Current vital signs: BP (!) 105/55 (BP Location: Right Arm)   Pulse 72   Temp 98.5 F (36.9 C)   Resp 16   Ht 5' 1"  (1.549 m)   Wt 78 kg   SpO2 97%   BMI 32.49 kg/m  Vital signs in last 24 hours: Temp:  [98 F (36.7 C)-98.5 F (36.9 C)] 98.5 F (36.9 C) (01/29 0815) Pulse Rate:  [72-84] 72 (01/29 0815) Resp:  [16-27] 16 (01/29 0815) BP: (105-132)/(55-98) 105/55 (01/29 0815) SpO2:  [94 %-98 %] 97 % (01/29 0815) General: Comfortably sitting in chair in no distress, pleasantly communicating HEENT: Normocephalic atraumatic CVs: Regular rate rhythm Extremities warm well perfused Abdomen nondistended nontender Respiratory: Breathing well saturating normally on room air Neurologic exam Awake alert oriented x3 Speech is nondysarthric No evidence of aphasia Mildly reduced attention concentration Cranial nerves II to XII intact Motor system with no drift in any of the 4 extremities. Sensory exam intact to light touch all over Coordination: There is mild increased tone worse on the left arm compared to the right and resting tremor of the left arm. Gait testing deferred-according to the staff, walking to the bathroom with a cautious gait.  Medications  Current Facility-Administered Medications:  .  acetaminophen (TYLENOL) tablet 650 mg, 650 mg, Oral, Q4H PRN **OR** acetaminophen (TYLENOL) suppository 650 mg, 650 mg, Rectal, Q4H PRN, Mansy, Jan A, MD .  albuterol (VENTOLIN HFA) 108 (90 Base) MCG/ACT inhaler 1-2 puff, 1-2 puff, Inhalation, Q6H PRN, Mansy, Jan A, MD .  ascorbic acid (VITAMIN C) tablet 500 mg, 500 mg, Oral, Daily, Mansy, Jan A, MD, 500 mg at 06/05/20 0857 .  cholecalciferol (VITAMIN D3) tablet 1,000 Units, 1,000 Units, Oral, Daily, Mansy, Jan A, MD, 1,000 Units at 06/05/20 0857 .  furosemide (LASIX) tablet 20 mg, 20 mg, Oral, Daily, Mansy, Jan A, MD, 20 mg at  06/05/20 0857 .  lactulose (CHRONULAC) 10 GM/15ML solution 10 g, 10 g, Oral, Daily, Fritzi Mandes, MD, 10 g at 06/05/20 0859 .  levETIRAcetam (KEPPRA) tablet 500 mg, 500 mg, Oral, BID, Amie Portland, MD, 500 mg at 06/05/20 1012 .  loperamide (IMODIUM) capsule 2 mg, 2 mg, Oral, Q6H PRN, Fritzi Mandes, MD .  LORazepam (ATIVAN) injection 1-2 mg, 1-2 mg, Intravenous, Q2H PRN, Mansy, Jan A, MD .  magnesium oxide (MAG-OX) tablet 200 mg, 200 mg, Oral, Daily, Mansy, Jan A, MD, 200 mg at 06/05/20 0857 .  nadolol (CORGARD) tablet 10 mg, 10 mg, Oral, Daily, Mansy, Jan A, MD, 10 mg at 06/05/20 1012 .  ondansetron (ZOFRAN) tablet 4 mg, 4 mg, Oral, Q6H PRN **OR** ondansetron (ZOFRAN) injection 4 mg, 4 mg, Intravenous, Q6H PRN, Mansy, Jan A, MD .  pantoprazole (PROTONIX) EC tablet 40 mg, 40 mg, Oral, BID, Mansy, Jan A, MD, 40 mg at 06/05/20 0858 .  rifaximin (XIFAXAN) tablet 550 mg, 550 mg, Oral, BID, Mansy, Jan A, MD, 550 mg at 06/05/20 0857 .  spironolactone (ALDACTONE) tablet 100 mg, 100 mg, Oral, Daily, Mansy, Jan A, MD, 100 mg at 06/05/20 0858 .  zinc sulfate capsule 220 mg, 220 mg, Oral, Daily, Mansy, Jan A, MD, 220 mg at 06/05/20 0857 Labs CBC    Component Value Date/Time   WBC 3.2 (L) 06/04/2020 0240   RBC 3.27 (L) 06/04/2020 0240   HGB 10.4 (L) 06/04/2020 0240   HGB 11.5 12/05/2019 0856  HCT 30.4 (L) 06/04/2020 0240   HCT 33.6 (L) 12/05/2019 0856   PLT 60 (L) 06/04/2020 0240   PLT 52 (LL) 12/05/2019 0856   MCV 93.0 06/04/2020 0240   MCV 88 12/05/2019 0856   MCH 31.8 06/04/2020 0240   MCHC 34.2 06/04/2020 0240   RDW 15.9 (H) 06/04/2020 0240   RDW 12.8 12/05/2019 0856   LYMPHSABS 0.4 (L) 06/03/2020 1859   LYMPHSABS 0.5 (L) 12/05/2019 0856   MONOABS 0.3 06/03/2020 1859   EOSABS 0.0 06/03/2020 1859   EOSABS 0.1 12/05/2019 0856   BASOSABS 0.0 06/03/2020 1859   BASOSABS 0.0 12/05/2019 0856    CMP     Component Value Date/Time   NA 135 06/03/2020 2009   NA 129 (L) 12/05/2019 0856   K 4.0  06/03/2020 2009   CL 98 06/03/2020 2009   CO2 26 06/03/2020 2009   GLUCOSE 171 (H) 06/03/2020 2009   BUN 26 (H) 06/03/2020 2009   BUN 10 12/05/2019 0856   CREATININE 0.86 06/03/2020 2009   CREATININE 0.98 (H) 07/09/2018 1057   CALCIUM 9.6 06/03/2020 2009   PROT 5.9 (L) 06/03/2020 2009   PROT 6.5 12/05/2019 0856   ALBUMIN 3.2 (L) 06/03/2020 2009   ALBUMIN 3.8 12/05/2019 0856   AST 35 06/03/2020 2009   ALT 17 06/03/2020 2009   ALKPHOS 97 06/03/2020 2009   BILITOT 1.8 (H) 06/03/2020 2009   BILITOT 1.3 (H) 12/05/2019 0856   GFRNONAA >60 06/03/2020 2009   GFRNONAA 55 (L) 07/09/2018 1057   GFRAA >60 01/23/2020 1002   GFRAA 64 07/09/2018 1057  Ammonia normal  Imaging I have reviewed images in epic and the results pertinent to this consultation are: No new images  Assessment: 80 year old woman past history of hypertension dementia asthma, cirrhosis brought to the hospital for acute onset of unresponsiveness and concern for seizure-like activity. In the last 1 month she has had significant hyponatremia to the point where her sodium was 107 at 1 point.  This is likely related to cirrhosis. Family reports more than one episode of seizure-like activity in the past month.  Hyponatremia could be contributing.  Also will start trazodone which could lower seizure threshold.  EEG was negative. On my examination there is mild cogwheeling and resting tremor worse on the left hand compared to the right She was also started on risperidone, which might be contributing to this-drug-induced Parkinson's versus idiopathic Parkinson's Her cognitive decline is fairly acute and in the setting of hyponatremia, so I think that it will take some time to get back to her normal self-today she appeared much more cognizant of things around her than yesterday. I am not convinced that she has a primary seizure disorder but given the fact that she is prone to severe changes in her electrolytes especially sodium, I would  err on the side of keeping her on an antiepileptic since there has been a question of seizure-like activity. . Does not seem like CJD-there are better explanations as above for both the tremors as well as possible seizures.  Impression: -Episodes of unresponsiveness-toxic metabolic encephalopathy secondary to cirrhosis and hyponatremia -Question underlying Parkinson's given resting tremor and cogwheeling worse on the left compared to the right.  Idiopathic versus drug-induced-I have stopped risperidone. -Question underlying cognitive impairment that has gone undetected but according to the family the change in mentation was very acute and has only been present for about 4 to 6 weeks.  Will require further outpatient work-up. -Incidental COVID-19 infection-no respiratory symptoms  Recommendations: I do not see a need for any further inpatient work-up from a neurological standpoint at this time. She should follow-up with outpatient neurology after discharge for further evaluation of tremors and cogwheeling in the left arm as well as can get a formal neuropsych testing at that time as well. I would also be helpful to have continuing neurology care to address the concern of seizures on an ongoing basis. For now I would keep her on the Keppra 500 twice daily and maintain seizure precautions. Driving restrictions-per [employer] law cannot drive unless 6 months seizure-free. Plan discussed with Dr. Posey Pronto over secure chart Please call with questions.  -- Amie Portland, MD Neurologist [facility] Pager: [phone]  Seizure precautions Per [facility] statutes, patients with seizures are not allowed to drive until they have been seizure-free for six months.   Use caution when using heavy equipment or power tools. Avoid working on ladders or at heights. Take showers instead of baths. Ensure the water temperature is not too high on the home water heater. Do not go  swimming alone. Do not lock yourself in a room alone (i.e. bathroom). When caring for infants or small children, sit down when holding, feeding, or changing them to minimize risk of injury to the child in the event you have a seizure. Maintain good sleep hygiene. Avoid alcohol.   If patient has another seizure, call 911 and bring them back to the ED if: A. The seizure lasts longer than 5 minutes.  B. The patient doesn't wake shortly after the seizure or has new problems such as difficulty seeing, speaking or moving following the seizure C. The patient was injured during the seizure D. The patient has a temperature over 102 F (39C) E. The patient vomited during the seizure and now is having trouble breathing

## 2020-06-05 NOTE — Progress Notes (Signed)
[place] [facility] [place] NAME: Jennifer [name]    MR#:  [id number]  DATE OF BIRTH:  [birth year]  SUBJECTIVE:   Patient sitting up from her chair telling me her skin with her buttock is raw. Made her sit down in the chair. She tells me she wants to go home.  REVIEW OF SYSTEMS:   Review of Systems  Constitutional: Negative for chills, fever and weight loss.  HENT: Negative for ear discharge, ear pain and nosebleeds.   Eyes: Negative for blurred vision, pain and discharge.  Respiratory: Negative for sputum production, shortness of breath, wheezing and stridor.   Cardiovascular: Negative for chest pain, palpitations, orthopnea and PND.  Gastrointestinal: Negative for abdominal pain, diarrhea, nausea and vomiting.  Genitourinary: Negative for frequency and urgency.  Musculoskeletal: Negative for back pain and joint pain.  Neurological: Positive for weakness. Negative for sensory change, speech change and focal weakness.  Psychiatric/Behavioral: Negative for depression and hallucinations. The patient is not nervous/anxious.    Tolerating Diet:yes Tolerating PT: ambulates by herself  DRUG ALLERGIES:   Allergies  Allergen Reactions  . Amoxicillin Other (See Comments)    Yeast infections  . Codeine Other (See Comments)    Pt denies  . Hydrocodone-Acetaminophen Other (See Comments)    Pt denies  . Nitrofurantoin Other (See Comments)  . Nitrofurantoin Monohyd Macro     "Sugar got high"  . Penicillins     Has patient had a PCN reaction causing immediate rash, facial/tongue/throat swelling, SOB or lightheadedness with hypotension: Unknown Has patient had a PCN reaction causing severe rash involving mucus membranes or skin necrosis: Unknown Has patient had a PCN reaction that required hospitalization: Unknown Has patient had a PCN reaction occurring within the last 10 years: Unknown If all of the above answers are "NO", then may proceed with  Cephalosporin use.  . Sulfa Antibiotics Other (See Comments)    VITALS:  Blood pressure (!) 105/55, pulse 72, temperature 98.5 F (36.9 C), resp. rate 16, height 5' 1"  (1.549 m), weight 78 kg, SpO2 97 %.  PHYSICAL EXAMINATION:   Physical Exam  GENERAL:  80 y.o.-year-old patient lying in the bed with no acute distress.  LUNGS: Normal breath sounds bilaterally, no wheezing, rales, rhonchi. No use of accessory muscles of respiration.  CARDIOVASCULAR: S1, S2 normal. No murmurs, rubs, or gallops.  ABDOMEN: Soft, nontender, nondistended. Bowel sounds present. No organomegaly or mass.  EXTREMITIES: No cyanosis, clubbing or edema b/l.    NEUROLOGIC: Cranial nerves II through XII are intact. No focal Motor or sensory deficits b/l.   PSYCHIATRIC:  patient is alert and oriented x 2.  SKIN: No obvious rash, lesion, or ulcer.   LABORATORY PANEL:  CBC Recent Labs  Lab 06/04/20 0240  WBC 3.2*  HGB 10.4*  HCT 30.4*  PLT 60*    Chemistries  Recent Labs  Lab 05/30/20 0455 05/31/20 0229 06/03/20 2009  NA 133*   < > 135  K 3.5   < > 4.0  CL 91*   < > 98  CO2 31   < > 26  GLUCOSE 101*   < > 171*  BUN 23   < > 26*  CREATININE 0.90   < > 0.86  CALCIUM 9.2   < > 9.6  MG 2.2  --   --   AST 35   < > 35  ALT 16   < > 17  ALKPHOS 64   < > 97  BILITOT 1.5*   < > 1.8*   < > = values in this interval not displayed.   Cardiac Enzymes No results for input(s): TROPONINI in the last 168 hours. RADIOLOGY:  EEG  Result Date: 06/04/2020 Lora Havens, MD     06/04/2020  4:58 PM Patient Name: Jennifer [name] MRN: [id number] Epilepsy Attending: Lora Havens Referring Physician/Provider: Dr Eugenie Norrie Date: 06/04/2020 Duration: 22.18 mins Patient history: 80yo F with multiple seizures in the setting of hyponatremia secondary to liver disease. EEG to evaluate for seizure Level of alertness: Awake,  asleep AEDs during EEG study: LEV Technical aspects: This EEG study was done with scalp electrodes  positioned according to the 10-20 International system of electrode placement. Electrical activity was acquired at a sampling rate of 500Hz  and reviewed with a high frequency filter of 70Hz  and a low frequency filter of 1Hz . EEG data were recorded continuously and digitally stored. Description: The posterior dominant rhythm consists of 7 Hz activity of moderate voltage (25-35 uV) seen predominantly in posterior head regions, symmetric and reactive to eye opening and eye closing. Sleep was characterized by vertex waves, sleep spindles (12 to 14 Hz), maximal frontocentral region. Hyperventilation and photic stimulation were not performed.   ABNORMALITY - Background slow IMPRESSION: This study is suggestive of mild diffuse encephalopathy, nonspecific etiology. No seizures or epileptiform discharges were seen throughout the recording. Lora Havens   CT HEAD WO CONTRAST  Result Date: 06/03/2020 CLINICAL DATA:  Unresponsive, seizure-like activity, confusion, cirrhosis EXAM: CT HEAD WITHOUT CONTRAST TECHNIQUE: Contiguous axial images were obtained from the base of the skull through the vertex without intravenous contrast. COMPARISON:  06/02/2020 FINDINGS: Brain: No acute infarct or hemorrhage. Lateral ventricles and midline structures are stable. No acute extra-axial fluid collections. No mass effect. Vascular: No hyperdense vessel or unexpected calcification. Skull: Normal. Negative for fracture or focal lesion. Sinuses/Orbits: No acute finding. Other: None. IMPRESSION: 1. Stable head CT, no acute process. Electronically Signed   By: Randa Ngo M.D.   On: 06/03/2020 21:32   MR BRAIN WO CONTRAST  Result Date: 06/04/2020 CLINICAL DATA:  Seizure EXAM: MRI HEAD WITHOUT CONTRAST TECHNIQUE: Multiplanar, multiecho pulse sequences of the brain and surrounding structures were obtained without intravenous contrast. COMPARISON:  None. FINDINGS: Brain: No acute infarct, mass effect or extra-axial collection. No acute  or chronic hemorrhage. Normal white matter signal, parenchymal volume and CSF spaces. The midline structures are normal. The hippocampi are normal and symmetric in size and signal. The hypothalamus and mamillary bodies are normal. There is no cortical ectopia or dysplasia. Vascular: Major flow voids are preserved. Skull and upper cervical spine: Normal calvarium and skull base. Visualized upper cervical spine and soft tissues are normal. Sinuses/Orbits:No paranasal sinus fluid levels or advanced mucosal thickening. No mastoid or middle ear effusion. Normal orbits. IMPRESSION: Normal MRI of the brain. No seizure etiology identified. Electronically Signed   By: Ulyses Jarred M.D.   On: 06/04/2020 01:24   ASSESSMENT AND PLAN:  Jennifer [name] is a 80 y.o. female has a past medical history of hypertension, dementia, asthma, cirrhosis, brought into the hospital for acute onset of unresponsiveness and seizure-like activity. She was recently discharged after treatment of hyponatremia from the hospital.  Her lowest sodium level was 107.Hyponatremia is likely secondary to the cirrhosis.she took to medications including rifaximin as well as risperidone and after that the family noticed that she became unresponsive with eyes closed and her face appeared to be clenched.  Lasted  for about 5 to 8 minutes.  EMS was called  Suspected New onset seizures. -She will be placed on seizures precautions. -l continue her on as needed IV Ativan. -Neurology consult with Dr Rory Percy appreciated  --EEG no epileptiform or seizure activity noted -- no seizures or tremors so far. -MRI without contrast-negative --  empirically on Keppra 500 BID  COVID-19 infection-- incidental -- patient was tested negative on 19th 23rd and 26th January during her hospital and ER visit. She tested positive on the 27th. -- Patient is asymptomatic. CRP is .4. No indication for treatment.  Cirrhosis of liver due to NASH with history of severe  hyponatremia (recently) Chronic TCP--[facility]s [facility] dr B [facility] -- continue lactulose, rifaximin and nadolol -- patient's daughter [name] reports they're not able to afford Rifaximin--will have TOC look into it -- sodium is normal.   GERD. -continue PPI therapy.   Type 2 diabetes mellitus. - place the patient on supplement coverage with NovoLog.   DVT prophylaxis. -Subcutaneous Lovenox   CODE STATUS: Full code  Status is: Inpatient  Remains inpatient appropriate because:Ongoing diagnostic testing needed not appropriate for outpatient work up, Unsafe d/c plan  Dispo: The patient is from: Home  Anticipated d/c is to: Home  Anticipated d/c date is: 1-2 days  Patient currently is  medically stable to d/c however there is APS involvement and awaiting TOC to respond me regarding her d/c plan (send message to [facility])              Difficult to place patient No  Family communication : [name] daughter on the phone 1/28 Level of care: Med-Surg Status is: Inpatient      TOTAL TIME TAKING CARE OF THIS PATIENT: 25 minutes.  >50% time spent on counselling and coordination of care  Note: This dictation was prepared with Dragon dictation along with smaller phrase technology. Any transcriptional errors that result from this process are unintentional.  Fritzi Mandes M.D    [facility]   CC: Primary care physician; Mar Daring, PA-CPatient ID: Jennifer [name], female   DOB: 01-16-1941, 80 y.o.   MRN: [id number]

## 2020-06-05 NOTE — TOC Progression Note (Addendum)
Transition of Care [facility]) - Progression Note    Patient Details  Name: KINZLEIGH KANDLER MRN: [id number] Date of Birth: 05/14/1940  Transition of Care [facility]) CM/SW Contact  Truitt Merle, LCSW Phone Number: 06/05/2020, 2:13 PM  Clinical Narrative:    Patient refusing [facility] (SNF) and wants to discharge home. CSW Bernadene Bell spoke with Adult Scientist, forensic (APS) Social Worker (SW) Joanne Chars [phone]) stating discharge plan to home at this time is unsafe and asked that patient not be discharged until Monday to establish safe discharge plan. CSW updated Dr. Posey Pronto. TOC continuing to follow discharge needs.    Barriers to Discharge: Barriers Unresolved (comment)  Expected Discharge Plan and Services                                                 Social Determinants of Health (SDOH) Interventions    Readmission Risk Interventions No flowsheet data found.

## 2020-06-06 NOTE — Plan of Care (Signed)
Pt resting comfortably in the chair She appears to be in no apparent distress VSS. Assessment completed as charted.  Pt refused a few of her morning medications. Educated on the benefits of the medication but still refuses at this time. Up to [place]. 1 assist Pt denies any wants or needs at this time.  Chair alarm in place.  Denies any additional wants or needs at this time Call bell within reach. Will continue to closely monitor.   Problem: Education: Goal: Knowledge of General Education information will improve Description: Including pain rating scale, medication(s)/side effects and non-pharmacologic comfort measures Outcome: Progressing   Problem: Health Behavior/Discharge Planning: Goal: Ability to manage health-related needs will improve Outcome: Progressing   Problem: Clinical Measurements: Goal: Ability to maintain clinical measurements within normal limits will improve Outcome: Progressing Goal: Will remain free from infection Outcome: Progressing Goal: Diagnostic test results will improve Outcome: Progressing Goal: Respiratory complications will improve Outcome: Progressing Goal: Cardiovascular complication will be avoided Outcome: Progressing   Problem: Activity: Goal: Risk for activity intolerance will decrease Outcome: Progressing   Problem: Nutrition: Goal: Adequate nutrition will be maintained Outcome: Progressing   Problem: Coping: Goal: Level of anxiety will decrease Outcome: Progressing   Problem: Elimination: Goal: Will not experience complications related to bowel motility Outcome: Progressing Goal: Will not experience complications related to urinary retention Outcome: Progressing   Problem: Pain Managment: Goal: General experience of comfort will improve Outcome: Progressing   Problem: Safety: Goal: Ability to remain free from injury will improve Outcome: Progressing   Problem: Skin Integrity: Goal: Risk for impaired skin integrity will  decrease Outcome: Progressing

## 2020-06-06 NOTE — Progress Notes (Signed)
[facility] [place] NAME: Jennifer [name]    MR#:  [id number]  DATE OF BIRTH:  10/22/1940  SUBJECTIVE:  No new issues reported per RN. Pt has been pleasant  She tells me she wants to go home.  REVIEW OF SYSTEMS:   Review of Systems  Constitutional: Negative for chills, fever and weight loss.  HENT: Negative for ear discharge, ear pain and nosebleeds.   Eyes: Negative for blurred vision, pain and discharge.  Respiratory: Negative for sputum production, shortness of breath, wheezing and stridor.   Cardiovascular: Negative for chest pain, palpitations, orthopnea and PND.  Gastrointestinal: Negative for abdominal pain, diarrhea, nausea and vomiting.  Genitourinary: Negative for frequency and urgency.  Musculoskeletal: Negative for back pain and joint pain.  Neurological: Positive for weakness. Negative for sensory change, speech change and focal weakness.  Psychiatric/Behavioral: Negative for depression and hallucinations. The patient is not nervous/anxious.    Tolerating Diet:yes Tolerating PT: ambulates by herself  DRUG ALLERGIES:   Allergies  Allergen Reactions  . Amoxicillin Other (See Comments)    Yeast infections  . Codeine Other (See Comments)    Pt denies  . Hydrocodone-Acetaminophen Other (See Comments)    Pt denies  . Nitrofurantoin Other (See Comments)  . Nitrofurantoin Monohyd Macro     "Sugar got high"  . Penicillins     Has patient had a PCN reaction causing immediate rash, facial/tongue/throat swelling, SOB or lightheadedness with hypotension: Unknown Has patient had a PCN reaction causing severe rash involving mucus membranes or skin necrosis: Unknown Has patient had a PCN reaction that required hospitalization: Unknown Has patient had a PCN reaction occurring within the last 10 years: Unknown If all of the above answers are "NO", then may proceed with Cephalosporin use.  . Sulfa Antibiotics Other (See Comments)     VITALS:  Blood pressure (!) 109/52, pulse 82, temperature 98.2 F (36.8 C), temperature source Oral, resp. rate 16, height 5' 1"  (1.549 m), weight 78 kg, SpO2 96 %.  PHYSICAL EXAMINATION:   Physical Exam  GENERAL:  80 y.o.-year-old patient lying in the bed with no acute distress.  LUNGS: Normal breath sounds bilaterally, no wheezing, rales, rhonchi. No use of accessory muscles of respiration.  CARDIOVASCULAR: S1, S2 normal. No murmurs, rubs, or gallops.  ABDOMEN: Soft, nontender, nondistended. Bowel sounds present. No organomegaly or mass.  EXTREMITIES: No cyanosis, clubbing or edema b/l.    NEUROLOGIC: Cranial nerves II through XII are intact. No focal Motor or sensory deficits b/l.   PSYCHIATRIC:  patient is alert and oriented x 2.  SKIN: No obvious rash, lesion, or ulcer.   LABORATORY PANEL:  CBC Recent Labs  Lab 06/04/20 0240  WBC 3.2*  HGB 10.4*  HCT 30.4*  PLT 60*    Chemistries  Recent Labs  Lab 06/03/20 2009  NA 135  K 4.0  CL 98  CO2 26  GLUCOSE 171*  BUN 26*  CREATININE 0.86  CALCIUM 9.6  AST 35  ALT 17  ALKPHOS 97  BILITOT 1.8*   Cardiac Enzymes No results for input(s): TROPONINI in the last 168 hours. RADIOLOGY:  EEG  Result Date: 06/04/2020 Lora Havens, MD     06/04/2020  4:58 PM Patient Name: Jennifer [name] MRN: [id number] Epilepsy Attending: Lora Havens Referring Physician/Provider: Dr Eugenie Norrie Date: 06/04/2020 Duration: 22.18 mins Patient history: 80yo F with multiple seizures in the setting of hyponatremia secondary to liver disease. EEG to evaluate  for seizure Level of alertness: Awake,  asleep AEDs during EEG study: LEV Technical aspects: This EEG study was done with scalp electrodes positioned according to the 10-20 International system of electrode placement. Electrical activity was acquired at a sampling rate of 500Hz  and reviewed with a high frequency filter of 70Hz  and a low frequency filter of 1Hz . EEG data were recorded  continuously and digitally stored. Description: The posterior dominant rhythm consists of 7 Hz activity of moderate voltage (25-35 uV) seen predominantly in posterior head regions, symmetric and reactive to eye opening and eye closing. Sleep was characterized by vertex waves, sleep spindles (12 to 14 Hz), maximal frontocentral region. Hyperventilation and photic stimulation were not performed.   ABNORMALITY - Background slow IMPRESSION: This study is suggestive of mild diffuse encephalopathy, nonspecific etiology. No seizures or epileptiform discharges were seen throughout the recording. [place]   ASSESSMENT AND PLAN:  Jennifer [name] is a 80 y.o. female has a past medical history of hypertension, dementia, asthma, cirrhosis, brought into the hospital for acute onset of unresponsiveness and seizure-like activity. She was recently discharged after treatment of hyponatremia from the hospital.  Her lowest sodium level was 107.Hyponatremia is likely secondary to the cirrhosis.she took to medications including rifaximin as well as risperidone and after that the family noticed that she became unresponsive with eyes closed and her face appeared to be clenched.  Lasted for about 5 to 8 minutes.  EMS was called  Suspected New onset seizures. - on seizures precautions. - as needed IV Ativan. -Neurology consult with Dr Rory Percy appreciated --recommends   Empiric Keppra 500 BID --EEG no epileptiform or seizure activity noted -- no seizures or tremors so far. -MRI without contrast-negative  COVID-19 infection-- incidental -- patient was tested negative on 19th 23rd and 26th January during her hospital and ER visit. She tested positive on the 27th. -- Patient is asymptomatic. CRP is .4. No indication for treatment.  Cirrhosis of liver due to NASH with history of severe hyponatremia (recently) Chronic TCP--follows with dr B [facility] -- continue lactulose, rifaximin and nadolol -- patient's  daughter [name] reports they're not able to afford Rifaximin--will have TOC look into it -- sodium is normal.   GERD. -continue PPI therapy.   Type 2 diabetes mellitus. - place the patient on supplement coverage with NovoLog.   DVT prophylaxis. -Subcutaneous Lovenox   CODE STATUS: Full code  Status is: Inpatient  Remains inpatient appropriate because:Ongoing diagnostic testing needed not appropriate for outpatient work up, Unsafe d/c plan  Dispo: The patient is from: Home  Anticipated d/c is to: Home  Anticipated d/c date is: 1-2 days  Patient currently is  medically stable to d/c however there is APS involvement and per TOC need to wait for APS assessment on Monday regarding her d/c plan               Difficult to place patient No  Family communication : [name] daughter on the phone 1/30--left VM Level of care: Med-Surg Status is: Inpatient      TOTAL TIME TAKING CARE OF THIS PATIENT: 20 minutes.  >50% time spent on counselling and coordination of care  Note: This dictation was prepared with Dragon dictation along with smaller phrase technology. Any transcriptional errors that result from this process are unintentional.  Fritzi Mandes M.D    [facility]   CC: Primary care physician; Mar Daring, PA-CPatient ID: Jennifer [name], female   DOB: 1940/07/24, 80 y.o.  MRN: [id number]

## 2020-06-07 ENCOUNTER — Ambulatory Visit: Payer: Self-pay | Admitting: Physician Assistant

## 2020-06-07 MED ORDER — ZINC OXIDE 40 % EX OINT
TOPICAL_OINTMENT | Freq: Every morning | CUTANEOUS | Status: AC
  Filled 2020-06-07: qty 113

## 2020-06-07 MED ORDER — RISPERIDONE 0.25 MG PO TABS
0.2500 mg | ORAL_TABLET | Freq: Once | ORAL | Status: DC
  Filled 2020-06-07: qty 1

## 2020-06-07 NOTE — Progress Notes (Signed)
Pt refusing AM medications despite education of risks. MD notified.

## 2020-06-07 NOTE — Patient Instructions (Addendum)
Thank you for allowing the Chronic Care Management team to participate in your care.   Patient Care Plan: Fall Risk (Adult)    Problem Identified: Fall Risk     Long-Range Goal: Absence of Fall and Fall-Related Injury   Start Date: 06/01/2020  Expected End Date: 09/29/2020  Priority: High  Note:   Current Barriers:  . Chronic Disease Management support and education needs related to fall prevention.  Clinical Goal(s):  Over the next 120 days, patient, . Will not experience falls. Patient will not require hospitalization or emergent care d/t fall related injuries. . Will follow recommended safety and fall prevention measures.  Interventions:  . Collaboration with Mar Daring, PA-C regarding development and update of comprehensive plan of care as evidenced by provider attestation and co-signature . Inter-disciplinary care team collaboration (see longitudinal plan of care) . Provided  verbal education re: Potential causes of falls and fall prevention strategies . Reviewed medications and discussed potential side effects of medications such as dizziness and frequent urination . Discussed history of falls. Discussed concerns regarding balance and gait. . Discussed importance of using appropriate assistive device when ambulating to prevent accidental falls.  . Encouraged to avoid strenuous activities and overreaching. Encouraged to take breaks as needed and request assistance from her caregiver when needed.  Patient Goals/Self-Care Activities . Over the next 120 days, patient will:   - Utilize (assistive device) appropriately with all ambulation - De-clutter walkways - Change positions slowly - Wear secure fitting shoes at all times with ambulation - Utilize home lighting for dim lit areas - Demonstrate self and pet awareness at all times  Follow Up Plan:  The care management team will reach out to the patient again over the 14 next days.             Patient Care Plan:  Diabetes Type 2 (Adult)    Problem Identified: Glycemic Management (Diabetes, Type 2)     Long-Range Goal: Glycemic Management Optimized   Start Date: 06/01/2020  Expected End Date: 09/29/2020  Priority: High  Note:   Objective:  Lab Results  Component Value Date   HGBA1C 5.7 (H) 05/19/2020 .   Lab Results  Component Value Date   CREATININE 0.72 05/31/2020   CREATININE 0.90 05/30/2020   CREATININE 0.97 05/29/2020 .    Current Barriers:  . Chronic Care Management support r/t Diabetes Self Management.   Case Manager Clinical Goal(s):  Marland Kitchen Collaboration with Mar Daring, PA-C regarding development and update of comprehensive plan of care as evidenced by provider attestation and co-signature . Inter-disciplinary care team collaboration (see longitudinal plan of care) Over the next 120 days: .      Patient will demonstrate improved adherence to prescribed treatment plan for diabetes self      care/management. . Patient will monitor and record blood glucose levels daily. . Patient will adhere to recommended ADA/carb modified diet.  Interventions:  . Reviewed medications with patient and discussed importance of medication adherence. Patient advised to take medications as prescribed. . Reviewed medication and indications for use. Patient advised to take medications as prescribed. . Discussed fasting blood glucose levels and compliance with recommended diabetic diet. Reviewed s/sx of hypoglycemia and hyperglycemia along with appropriate interventions. Encouraged to monitor and record blood glucose readings daily. Patient reports increased readings over the past few weeks. Reports fasting readings in the 150's. Attempting to comply with a diabetic diet.  . Reviewed scheduled/upcoming provider appointments. Encouraged to attend medical appointments as scheduled to  prevent delays in care. Encouraged to notify the care management team if transportation assistance is needed.  Patient  Goals/Self-Care Activities . Over the next 120 days, patient will:  -Self administers medications. -Continue adherence to recommended diabetic diet. -Monitor blood sugars and utilize hyperglycemia and hypoglycemia protocols as needed. -Attend scheduled follow-up appointments.  Follow Up Plan:  The care management team will reach out to the patient again over the next 14 days.               Ms. Villari was given information about Chronic Care Management services including:  1. CCM service includes personalized support from designated clinical staff supervised by her physician, including individualized plan of care and coordination with other care providers 2. 24/7 contact phone numbers for assistance for urgent and routine care needs. 3. Service will only be billed when office clinical staff spend 20 minutes or more in a month to coordinate care. 4. Only one practitioner may furnish and bill the service in a calendar month. 5. The patient may stop CCM services at any time (effective at the end of the month) by phone call to the office staff. 6. The patient will be responsible for cost sharing (co-pay) of up to 20% of the service fee (after annual deductible is met).  Patient agreed to services and verbal consent obtained.     Ms. Megna verbalized understanding of the information discussed during the telephonic outreach today. Declined need for a mailed copy of the plan. A member of the care management team will follow up with Ms. Whitebread within the next two weeks.    [facility] Care Management [facility] [phone]

## 2020-06-07 NOTE — Plan of Care (Signed)
  Problem: Pain Managment: Goal: General experience of comfort will improve Outcome: Progressing   Problem: Safety: Goal: Ability to remain free from injury will improve Outcome: Progressing   Problem: Skin Integrity: Goal: Risk for impaired skin integrity will decrease Outcome: Progressing   Problem: Education: Goal: Knowledge of General Education information will improve Description: Including pain rating scale, medication(s)/side effects and non-pharmacologic comfort measures Outcome: Not Progressing  Pt refusing scheduled medications

## 2020-06-07 NOTE — Telephone Encounter (Signed)
Yes this is ok 

## 2020-06-07 NOTE — Progress Notes (Signed)
Attempted to give pt missed medications from the morning; still refusing at this time. MD notified

## 2020-06-07 NOTE — TOC Progression Note (Signed)
Transition of Care [facility]) - Progression Note    Patient Details  Name: Jennifer [name] MRN: [id number] Date of Birth: [birth year]  Transition of Care [facility] CM/SW Contact  Candie Chroman, LCSW Phone Number: 06/07/2020, 4:21 PM  Clinical Narrative:  Plan to discharge home tomorrow. Daughter will pick her up around 11:00. Patient will go to her home. Address on facesheet is correct. Son lives behind her house and helps as much as he can. Patient's sister will give her medications in the morning and daughter will give nighttime medications. Discussed home with home health vs. Hospice. Daughter prefers home hospice services. [facility] RN came to the home this morning, not realizing patient hadn't discharged yet. Patient has a walker, wheelchair, and bedside commode at home. No other DME needed at this time.      Barriers to Discharge: Barriers Unresolved (comment)  Expected Discharge Plan and Services                                                 Social Determinants of Health (SDOH) Interventions    Readmission Risk Interventions No flowsheet data found.

## 2020-06-07 NOTE — Chronic Care Management (AMB) (Signed)
Chronic Care Management   Initial Visit Note   Name: Jennifer [name] MRN: [id number] DOB: [birth year]  Primary Care Provider: Mar Daring, PA-C Reason for referral : Chronic Care Management  Jennifer [name] is a 80 y.o. year old female who is a primary care patient of [facility]. The CCM team was consulted for assistance with chronic disease management and care coordination.  Review of Jennifer [name] status, including review of consultants reports, relevant labs and test results was conducted today. Collaboration with appropriate care team members was performed as part of the comprehensive evaluation and provision of chronic care management services.    SDOH (Social Determinants of Health) assessments performed: Yes See Care Plan activities for detailed interventions related to SDOH  SDOH Interventions   Flowsheet Row Most Recent Value  SDOH Interventions   Food Insecurity Interventions Intervention Not Indicated  Transportation Interventions Intervention Not Indicated       Medications: No facility-administered encounter medications on file as of 06/01/2020.   Outpatient Encounter Medications as of 06/01/2020  Medication Sig Note  . albuterol (VENTOLIN HFA) 108 (90 Base) MCG/ACT inhaler Inhale 1-2 puffs into the lungs every 6 (six) hours as needed for wheezing or shortness of breath.   Marland Kitchen FLUZONE HIGH-DOSE QUADRIVALENT 0.7 ML SUSY    . furosemide (LASIX) 20 MG tablet TAKE ONE TABLET EVERY DAY (Patient taking differently: Take 20 mg by mouth daily.)   . lactulose (CHRONULAC) 10 GM/15ML solution Take 30 mLs (20 g total) by mouth 2 (two) times daily.   . Magnesium 200 MG TABS Take 1 tablet by mouth daily.   . nadolol (CORGARD) 20 MG tablet TAKE ONE-HALF TABLET BY MOUTH EVERY DAY (Patient taking differently: Take 10 mg by mouth daily.) 05/26/2020: Patient is down to 10 mg once daily  . rifaximin (XIFAXAN) 550 MG TABS tablet Take 1 tablet (550 mg total) by mouth 2  (two) times daily.   Marland Kitchen spironolactone (ALDACTONE) 100 MG tablet TAKE ONE TABLET BY MOUTH EVERY DAY (Patient taking differently: Take 100 mg by mouth daily.)   . Blood Glucose Monitoring Suppl (ACCU-CHEK AVIVA PLUS) w/Device KIT To check blood sugar daily   . glucose blood (ACCU-CHEK AVIVA PLUS) test strip To check blood sugar daily   . Lancets (ACCU-CHEK SOFT TOUCH) lancets To check blood sugar daily   . liver oil-zinc oxide (DESITIN) 40 % ointment Apply topically as needed for irritation.   . pantoprazole (PROTONIX) 40 MG tablet TAKE ONE TABLET BY MOUTH TWICE DAILY (Patient taking differently: Take 40 mg by mouth 2 (two) times daily.)      Objective:  Patient Care Plan: Fall Risk (Adult)    Problem Identified: Fall Risk     Long-Range Goal: Absence of Fall and Fall-Related Injury   Start Date: 06/01/2020  Expected End Date: 09/29/2020  Priority: High  Note:   Current Barriers:  . Chronic Disease Management support and education needs related to fall prevention.  Clinical Goal(s):  Over the next 120 days, patient, . Will not experience falls. Patient will not require hospitalization or emergent care d/t fall related injuries. . Will follow recommended safety and fall prevention measures.  Interventions:  . Collaboration with Mar Daring, PA-C regarding development and update of comprehensive plan of care as evidenced by provider attestation and co-signature . Inter-disciplinary care team collaboration (see longitudinal plan of care) . Provided  verbal education re: Potential causes of falls and fall prevention strategies . Reviewed medications and discussed potential  side effects of medications such as dizziness and frequent urination . Discussed history of falls. Discussed concerns regarding balance and gait. . Discussed importance of using appropriate assistive device when ambulating to prevent accidental falls.  . Encouraged to avoid strenuous activities and overreaching.  Encouraged to take breaks as needed and request assistance from her caregiver when needed.  Patient Goals/Self-Care Activities . Over the next 120 days, patient will:   - Utilize (assistive device) appropriately with all ambulation - De-clutter walkways - Change positions slowly - Wear secure fitting shoes at all times with ambulation - Utilize home lighting for dim lit areas - Demonstrate self and pet awareness at all times  Follow Up Plan:  The care management team will reach out to the patient again over the 14 next days.             Patient Care Plan: Diabetes Type 2 (Adult)    Problem Identified: Glycemic Management (Diabetes, Type 2)     Long-Range Goal: Glycemic Management Optimized   Start Date: 06/01/2020  Expected End Date: 09/29/2020  Priority: High  Note:   Objective:  Lab Results  Component Value Date   HGBA1C 5.7 (H) 05/19/2020 .   Lab Results  Component Value Date   CREATININE 0.72 05/31/2020   CREATININE 0.90 05/30/2020   CREATININE 0.97 05/29/2020 .    Current Barriers:  . Chronic Care Management support r/t Diabetes Self Management.   Case Manager Clinical Goal(s):  Marland Kitchen Collaboration with Mar Daring, PA-C regarding development and update of comprehensive plan of care as evidenced by provider attestation and co-signature . Inter-disciplinary care team collaboration (see longitudinal plan of care) Over the next 120 days: .      Patient will demonstrate improved adherence to prescribed treatment plan for diabetes self      care/management. . Patient will monitor and record blood glucose levels daily. . Patient will adhere to recommended ADA/carb modified diet.  Interventions:  . Reviewed medications with patient and discussed importance of medication adherence. Patient advised to take medications as prescribed. . Reviewed medication and indications for use. Patient advised to take medications as prescribed. . Discussed fasting blood glucose  levels and compliance with recommended diabetic diet. Reviewed s/sx of hypoglycemia and hyperglycemia along with appropriate interventions. Encouraged to monitor and record blood glucose readings daily. Patient reports increased readings over the past few weeks. Reports fasting readings in the 150's. Attempting to comply with a diabetic diet.  . Reviewed scheduled/upcoming provider appointments. Encouraged to attend medical appointments as scheduled to prevent delays in care. Encouraged to notify the care management team if transportation assistance is needed.  Patient Goals/Self-Care Activities . Over the next 120 days, patient will:  -Self administers medications. -Continue adherence to recommended diabetic diet. -Monitor blood sugars and utilize hyperglycemia and hypoglycemia protocols as needed. -Attend scheduled follow-up appointments.  Follow Up Plan:  The care management team will reach out to the patient again over the next 14 days.               Jennifer [name] was given information about Chronic Care Management services including:  1. CCM service includes personalized support from designated clinical staff supervised by her physician, including individualized plan of care and coordination with other care providers 2. 24/7 contact phone numbers for assistance for urgent and routine care needs. 3. Service will only be billed when office clinical staff spend 20 minutes or more in a month to coordinate care. 4. Only one practitioner may  furnish and bill the service in a calendar month. 5. The patient may stop CCM services at any time (effective at the end of the month) by phone call to the office staff. 6. The patient will be responsible for cost sharing (co-pay) of up to 20% of the service fee (after annual deductible is met).  Patient agreed to services and verbal consent obtained.    PLAN A member of the care management team will follow up with Jennifer [name] within the next two  weeks.   [facility] Care Management [facility] [phone]

## 2020-06-07 NOTE — Progress Notes (Signed)
Jennifer [name] at [place] NAME: Jennifer [name]    MR#:  [id number]  DATE OF BIRTH:  12-06-1940  SUBJECTIVE:  No new issues reported per RN. Pt has been pleasant  She tells me she wants to go home earleir when I saw her in the morning per RN refused to take her meds other than Lasix. When back to see her she communicated well with me.  REVIEW OF SYSTEMS:   Review of Systems  Constitutional: Negative for chills, fever and weight loss.  HENT: Negative for ear discharge, ear pain and nosebleeds.   Eyes: Negative for blurred vision, pain and discharge.  Respiratory: Negative for sputum production, shortness of breath, wheezing and stridor.   Cardiovascular: Negative for chest pain, palpitations, orthopnea and PND.  Gastrointestinal: Negative for abdominal pain, diarrhea, nausea and vomiting.  Genitourinary: Negative for frequency and urgency.  Musculoskeletal: Negative for back pain and joint pain.  Neurological: Positive for weakness. Negative for sensory change, speech change and focal weakness.  Psychiatric/Behavioral: Negative for depression and hallucinations. The patient is not nervous/anxious.    Tolerating Diet:yes Tolerating PT: ambulates by herself  DRUG ALLERGIES:   Allergies  Allergen Reactions  . Amoxicillin Other (See Comments)    Yeast infections  . Codeine Other (See Comments)    Pt denies  . Hydrocodone-Acetaminophen Other (See Comments)    Pt denies  . Nitrofurantoin Other (See Comments)  . Nitrofurantoin Monohyd Macro     "Sugar got high"  . Penicillins     Has patient had a PCN reaction causing immediate rash, facial/tongue/throat swelling, SOB or lightheadedness with hypotension: Unknown Has patient had a PCN reaction causing severe rash involving mucus membranes or skin necrosis: Unknown Has patient had a PCN reaction that required hospitalization: Unknown Has patient had a PCN reaction occurring within the last 10  years: Unknown If all of the above answers are "NO", then may proceed with Cephalosporin use.  . Sulfa Antibiotics Other (See Comments)    VITALS:  Blood pressure 100/65, pulse 81, temperature 98.6 F (37 C), temperature source Oral, resp. rate 19, height 5' 1"  (1.549 m), weight 78 kg, SpO2 93 %.  PHYSICAL EXAMINATION:   Physical Exam  GENERAL:  80 y.o.-year-old patient lying in the bed with no acute distress.  LUNGS: Normal breath sounds bilaterally, no wheezing, rales, rhonchi. No use of accessory muscles of respiration.  CARDIOVASCULAR: S1, S2 normal. No murmurs, rubs, or gallops.  ABDOMEN: Soft, nontender, nondistended. Bowel sounds present. No organomegaly or mass.  EXTREMITIES: No cyanosis, clubbing or edema b/l.    NEUROLOGIC: Cranial nerves II through XII are intact. No focal Motor or sensory deficits b/l.   PSYCHIATRIC:  patient is alert and oriented x 2.  SKIN: No obvious rash, lesion, or ulcer.   LABORATORY PANEL:  CBC Recent Labs  Lab 06/04/20 0240  WBC 3.2*  HGB 10.4*  HCT 30.4*  PLT 60*    Chemistries  Recent Labs  Lab 06/03/20 2009  NA 135  K 4.0  CL 98  CO2 26  GLUCOSE 171*  BUN 26*  CREATININE 0.86  CALCIUM 9.6  AST 35  ALT 17  ALKPHOS 97  BILITOT 1.8*   Cardiac Enzymes No results for input(s): TROPONINI in the last 168 hours. RADIOLOGY:  No results found. ASSESSMENT AND PLAN:  Jennifer [name] is a 79 y.o. female has a past medical history of hypertension, dementia, asthma, cirrhosis, brought into the hospital for acute  onset of unresponsiveness and seizure-like activity. She was recently discharged after treatment of hyponatremia from the hospital.  Her lowest sodium level was 107.Hyponatremia is likely secondary to the cirrhosis.she took to medications including rifaximin as well as risperidone and after that the family noticed that she became unresponsive with eyes closed and her face appeared to be clenched.  Lasted for about 5 to 8  minutes.  EMS was called  Suspected New onset seizures. - on seizures precautions. - as needed IV Ativan. -Neurology consult with Dr Jennifer [name] appreciated --recommends   Empiric Keppra 500 BID --EEG no epileptiform or seizure activity noted -- no seizures or tremors so far. -MRI without contrast-negative -- discussed with patient's daughter Jennifer [name] to follow-up outpatient neurology and or PCP to consider do dementia workup. Patient has times when she is pleasantly confused and at times refusing meds. She is most of the time pleasant and calm.  COVID-19 infection-- incidental -- patient was tested negative on 19th 23rd and 26th January during her hospital and ER visit. She tested positive on the 27th. -- Patient is asymptomatic. CRP is .4. No indication for treatment.  Cirrhosis of liver due to NASH with history of severe hyponatremia (recently) Chronic TCP--follows with dr B at [facility] -- continue lactulose, rifaximin and nadolol -- patient's daughter Jennifer [name] reports they're not able to afford Rifaximin--will have TOC look into it -- sodium is normal.   GERD. -continue PPI therapy.   Type 2 diabetes mellitus. - place the patient on supplement coverage with NovoLog.   DVT prophylaxis. -Subcutaneous Lovenox   CODE STATUS: Full code  Status is: Inpatient  Remains inpatient appropriate because:Ongoing diagnostic testing needed not appropriate for outpatient work up, Unsafe d/c plan  Dispo: The patient is from: Home  Anticipated d/c is to: Home  Anticipated d/c date is: 2/1  Patient currently is  medically stable to d/c. Will monitor one more day. Since patient has had some intermittent confusion. Daughter will come tomorrow to pick her up around 11 AM. Discussed with daughter Jennifer [name].  Difficult to place patient No  Family communication : Jennifer [name] daughter on the phone 1/31 Level of care: Med-Surg Status is:  Inpatient  TOC to discuss with daughter home health needs.    TOTAL TIME TAKING CARE OF THIS PATIENT: 25 minutes.  >50% time spent on counselling and coordination of care  Note: This dictation was prepared with Dragon dictation along with smaller phrase technology. Any transcriptional errors that result from this process are unintentional.  Fritzi Mandes M.D    [facility]   CC: Primary care physician; Mar Daring, PA-CPatient ID: Jennifer [name], female   DOB: 1940-07-28, 80 y.o.   MRN: [id number]

## 2020-06-07 NOTE — Progress Notes (Signed)
[facility]) Hospital Liaison RN note:  This patient is a pending hospice patient with [facility]. She was scheduled to have an admission visit today. [facility] Liaison was notified today that she was admitted to hospital. Spoke with Dayton Scrape, TOC. Plan is that she will discharge home tomorrow by private vehicle. Family would still like to have [place] to follow with hospice support. [facility] Liaison will notify our referral to make them aware of her discharge so that they can proceed with admission.  Please call with any hospice related questions or concerns.  Zandra Abts, RN [facility] Liaison  [phone]

## 2020-06-07 NOTE — Telephone Encounter (Signed)
Tammy advised as below.

## 2020-06-08 MED ORDER — VITAMIN D3 25 MCG PO TABS: 1000.0000 [IU] | ORAL_TABLET | Freq: Every day | ORAL | 0 refills | Status: AC

## 2020-06-08 MED ORDER — LEVETIRACETAM 500 MG PO TABS: 500.0000 mg | ORAL_TABLET | Freq: Two times a day (BID) | ORAL | 1 refills | Status: DC

## 2020-06-08 NOTE — Discharge Summary (Signed)
[place] at [place] NAME: Jennifer [name]    MR#:  [id number]  DATE OF BIRTH:  10-Feb-1941  DATE OF ADMISSION:  06/03/2020 ADMITTING PHYSICIAN: Christel Mormon, MD  DATE OF DISCHARGE: 06/08/2020  PRIMARY CARE PHYSICIAN: Mar Daring, PA-C    ADMISSION DIAGNOSIS:  Seizure ([place]) [R56.9] Seizure-like activity ([place]) [R56.9]  DISCHARGE DIAGNOSIS:  Suspected Seizure COVID-19 --incidental Cirrhosis due to NASH  SECONDARY DIAGNOSIS:   Past Medical History:  Diagnosis Date  . Allergy   . Arthritis   . Asthma   . Colon polyp   . Fatty liver   . GERD (gastroesophageal reflux disease)   . Hyperlipidemia   . Hypertension   . Motion sickness    boats    HOSPITAL COURSE:   Jennifer [name] a 80 y.o.femalehas a past medical history of hypertension, dementia, asthma, cirrhosis, brought into the hospital for acute onset of unresponsiveness and seizure-like activity. She was recently discharged after treatment of hyponatremia from the hospital.Her lowest sodium level was 107.Hyponatremia is likely secondary to the cirrhosis.she took to medications including rifaximin as well as risperidone and after that the family noticed that she became unresponsive with eyes closed and her face appeared to be clenched. Lasted for about 5 to 8 minutes. EMS was called  Suspected New onset seizures. -- on seizures precautions. - as needed IV Ativan. -Neurology consult with Dr Rory Percy appreciated --recommends   Empiric Keppra 500 BID --EEG no epileptiform or seizure activity noted -- no seizures or tremors so far. -MRI without contrast-negative -- discussed with patient's daughter Jennifer [name] to follow-up outpatient neurology and or PCP to consider do dementia workup. Patient has times when she is pleasantly confused and at times refusing meds. She is most of the time pleasant and calm.  COVID-19 infection-- incidental -- patient was tested negative  on 19th 23rd and 26th January during her hospital and ER visit. She tested positive on the 27th. -- Patient is asymptomatic. CRP is 0.4. No indication for treatment.  Cirrhosis of liver due to NASH with history of severe hyponatremia (recently) Chronic TCP--follows with dr B at cancer center -- continue lactulose, rifaximin and nadolol -- patient's daughter Jennifer [name] reports they're not able to afford Rifaximin--will have TOC look into it -- sodium is normal.  GERD. -continue PPI therapy.  Type 2 diabetes mellitus. - place the patient on supplement coverage with NovoLog. Not on any meds at home. Defer to PCP for DM-2 monitoring  DVT prophylaxis. -Subcutaneous Lovenox   CODE STATUS: Full code  Status is: Inpatient  Dispo: The patient is from:Home Anticipated d/c is HB:ZJIR with hospice Anticipated d/c date is: 06/08/20 Patient currently is  medically stable to d/c. Difficult to place patient No  Family communication : Jennifer [name] daughter on the phone 1/31 Level of care: Med-Surg Status is: Inpatient  D/c home today CONSULTS OBTAINED:    DRUG ALLERGIES:   Allergies  Allergen Reactions  . Amoxicillin Other (See Comments)    Yeast infections  . Codeine Other (See Comments)    Pt denies  . Hydrocodone-Acetaminophen Other (See Comments)    Pt denies  . Nitrofurantoin Other (See Comments)  . Nitrofurantoin Monohyd Macro     "Sugar got high"  . Penicillins     Has patient had a PCN reaction causing immediate rash, facial/tongue/throat swelling, SOB or lightheadedness with hypotension: Unknown Has patient had a PCN reaction causing severe rash involving mucus membranes or skin necrosis: Unknown Has  patient had a PCN reaction that required hospitalization: Unknown Has patient had a PCN reaction occurring within the last 10 years: Unknown If all of the above answers are "NO", then may proceed with Cephalosporin use.  . Sulfa  Antibiotics Other (See Comments)    DISCHARGE MEDICATIONS:   Allergies as of 06/08/2020      Reactions   Amoxicillin Other (See Comments)   Yeast infections   Codeine Other (See Comments)   Pt denies   Hydrocodone-acetaminophen Other (See Comments)   Pt denies   Nitrofurantoin Other (See Comments)   Nitrofurantoin Monohyd Macro    "Sugar got high"   Penicillins    Has patient had a PCN reaction causing immediate rash, facial/tongue/throat swelling, SOB or lightheadedness with hypotension: Unknown Has patient had a PCN reaction causing severe rash involving mucus membranes or skin necrosis: Unknown Has patient had a PCN reaction that required hospitalization: Unknown Has patient had a PCN reaction occurring within the last 10 years: Unknown If all of the above answers are "NO", then may proceed with Cephalosporin use.   Sulfa Antibiotics Other (See Comments)      Medication List    STOP taking these medications   Fluzone High-Dose Quadrivalent 0.7 ML Susy Generic drug: Influenza Vac High-Dose Quad   risperiDONE 0.25 MG tablet Commonly known as: RisperDAL     TAKE these medications   Accu-Chek Aviva Plus test strip Generic drug: glucose blood To check blood sugar daily   Accu-Chek Aviva Plus w/Device Kit To check blood sugar daily   accu-chek soft touch lancets To check blood sugar daily   albuterol 108 (90 Base) MCG/ACT inhaler Commonly known as: VENTOLIN HFA Inhale 1-2 puffs into the lungs every 6 (six) hours as needed for wheezing or shortness of breath.   furosemide 20 MG tablet Commonly known as: LASIX TAKE ONE TABLET EVERY DAY   lactulose 10 GM/15ML solution Commonly known as: CHRONULAC Take 30 mLs (20 g total) by mouth 2 (two) times daily.   levETIRAcetam 500 MG tablet Commonly known as: KEPPRA Take 1 tablet (500 mg total) by mouth 2 (two) times daily.   liver oil-zinc oxide 40 % ointment Commonly known as: DESITIN Apply topically as needed for  irritation.   Magnesium 200 MG Tabs Take 1 tablet by mouth daily.   nadolol 20 MG tablet Commonly known as: CORGARD TAKE ONE-HALF TABLET BY MOUTH EVERY DAY   pantoprazole 40 MG tablet Commonly known as: PROTONIX TAKE ONE TABLET BY MOUTH TWICE DAILY   rifaximin 550 MG Tabs tablet Commonly known as: XIFAXAN Take 1 tablet (550 mg total) by mouth 2 (two) times daily.   spironolactone 100 MG tablet Commonly known as: ALDACTONE TAKE ONE TABLET BY MOUTH EVERY DAY   Vitamin D3 25 MCG tablet Commonly known as: Vitamin D Take 1 tablet (1,000 Units total) by mouth daily.       If you experience worsening of your admission symptoms, develop shortness of breath, life threatening emergency, suicidal or homicidal thoughts you must seek medical attention immediately by calling 911 or calling your MD immediately  if symptoms less severe.  You Must read complete instructions/literature along with all the possible adverse reactions/side effects for all the Medicines you take and that have been prescribed to you. Take any new Medicines after you have completely understood and accept all the possible adverse reactions/side effects.   Please note  You were cared for by a hospitalist during your hospital stay. If you have any questions  about your discharge medications or the care you received while you were in the hospital after you are discharged, you can call the unit and asked to speak with the hospitalist on call if the hospitalist that took care of you is not available. Once you are discharged, your primary care physician will handle any further medical issues. Please note that NO REFILLS for any discharge medications will be authorized once you are discharged, as it is imperative that you return to your primary care physician (or establish a relationship with a primary care physician if you do not have one) for your aftercare needs so that they can reassess your need for medications and monitor your  lab values. Today   SUBJECTIVE   Pt sitting in the chair. Wants to go home.  VITAL SIGNS:  Blood pressure (!) 115/46, pulse 83, temperature 98 F (36.7 C), resp. rate 16, height 5' 1"  (1.549 m), weight 78 kg, SpO2 95 %.  I/O:  No intake or output data in the 24 hours ending 06/08/20 0901  PHYSICAL EXAMINATION:  GENERAL:  80 y.o.-year-old patient lying in the bed with no acute distress.  LUNGS: Normal breath sounds bilaterally, no wheezing, rales,rhonchi or crepitation. No use of accessory muscles of respiration.  CARDIOVASCULAR: S1, S2 normal. No murmurs, rubs, or gallops.  ABDOMEN: Soft, non-tender, non-distended. Bowel sounds present. No organomegaly or mass.  EXTREMITIES: No pedal edema, cyanosis, or clubbing.  NEUROLOGIC:groslly nonfocal PSYCHIATRIC: The patient is alert and  Awake--mood stable SKIN: No obvious rash, lesion, or ulcer.        DATA REVIEW:   CBC  Recent Labs  Lab 06/04/20 0240  WBC 3.2*  HGB 10.4*  HCT 30.4*  PLT 60*    Chemistries  Recent Labs  Lab 06/03/20 2009  NA 135  K 4.0  CL 98  CO2 26  GLUCOSE 171*  BUN 26*  CREATININE 0.86  CALCIUM 9.6  AST 35  ALT 17  ALKPHOS 97  BILITOT 1.8*    Microbiology Results   Recent Results (from the past 240 hour(s))  SARS CORONAVIRUS 2 (TAT 6-24 HRS) Nasopharyngeal Nasopharyngeal Swab     Status: None   Collection Time: 05/30/20  9:16 AM   Specimen: Nasopharyngeal Swab  Result Value Ref Range Status   SARS Coronavirus 2 NEGATIVE NEGATIVE Final    Comment: (NOTE) SARS-CoV-2 target nucleic acids are NOT DETECTED.  The SARS-CoV-2 RNA is generally detectable in upper and lower respiratory specimens during the acute phase of infection. Negative results do not preclude SARS-CoV-2 infection, do not rule out co-infections with other pathogens, and should not be used as the sole basis for treatment or other patient management decisions. Negative results must be combined with clinical  observations, patient history, and epidemiological information. The expected result is Negative.  Fact Sheet for Patients: SugarRoll.be  Fact Sheet for Healthcare Providers: https://www.woods-mathews.com/  This test is not yet approved or cleared by the [place] FDA and  has been authorized for detection and/or diagnosis of SARS-CoV-2 by FDA under an Emergency Use Authorization (EUA). This EUA will remain  in effect (meaning this test can be used) for the duration of the COVID-19 declaration under Se ction 564(b)(1) of the Act, 21 U.S.C. section 360bbb-3(b)(1), unless the authorization is terminated or revoked sooner.  Performed at [facility], [place] [address] [place], [place] [address]   SARS CORONAVIRUS 2 (TAT 6-24 HRS) Nasopharyngeal Nasopharyngeal Swab     Status: None   Collection Time: 06/02/20  11:13 AM   Specimen: Nasopharyngeal Swab  Result Value Ref Range Status   SARS Coronavirus 2 NEGATIVE NEGATIVE Final    Comment: (NOTE) SARS-CoV-2 target nucleic acids are NOT DETECTED.  The SARS-CoV-2 RNA is generally detectable in upper and lower respiratory specimens during the acute phase of infection. Negative results do not preclude SARS-CoV-2 infection, do not rule out co-infections with other pathogens, and should not be used as the sole basis for treatment or other patient management decisions. Negative results must be combined with clinical observations, patient history, and epidemiological information. The expected result is Negative.  Fact Sheet for Patients: SugarRoll.be  Fact Sheet for Healthcare Providers: https://www.woods-mathews.com/  This test is not yet approved or cleared by the [place] FDA and  has been authorized for detection and/or diagnosis of SARS-CoV-2 by FDA under an Emergency Use Authorization (EUA). This EUA will remain  in effect (meaning this  test can be used) for the duration of the COVID-19 declaration under Se ction 564(b)(1) of the Act, 21 U.S.C. section 360bbb-3(b)(1), unless the authorization is terminated or revoked sooner.  Performed at [facility], [place] [address], [place], [place] [address]   SARS Coronavirus 2 by RT PCR (hospital order, performed in [facility] hospital lab) Nasopharyngeal Nasopharyngeal Swab     Status: Abnormal   Collection Time: 06/03/20  9:43 PM   Specimen: Nasopharyngeal Swab  Result Value Ref Range Status   SARS Coronavirus 2 POSITIVE (A) NEGATIVE Final    Comment: RESULT CALLED TO, READ BACK BY AND VERIFIED WITH: [place] [phone] 06/03/20 [facility] (NOTE) SARS-CoV-2 target nucleic acids are DETECTED  SARS-CoV-2 RNA is generally detectable in upper respiratory specimens  during the acute phase of infection.  Positive results are indicative  of the presence of the identified virus, but do not rule out bacterial infection or co-infection with other pathogens not detected by the test.  Clinical correlation with patient history and  other diagnostic information is necessary to determine patient infection status.  The expected result is negative.  Fact Sheet for Patients:   StrictlyIdeas.no   Fact Sheet for Healthcare Providers:   BankingDealers.co.za    This test is not yet approved or cleared by the [place] FDA and  has been authorized for detection and/or diagnosis of SARS-CoV-2 by FDA under an Emergency Use Authorization (EUA).  This EUA will remain in effect (meaning this tes t can be used) for the duration of  the COVID-19 declaration under Section 564(b)(1) of the Act, 21 U.S.C. section 360-bbb-3(b)(1), unless the authorization is terminated or revoked sooner.  Performed at [facility], [address] [place], [place] [address]     RADIOLOGY:  No results found.   CODE STATUS:     Code Status Orders   (From admission, onward)         Start     Ordered   06/03/20 2210  Full code  Continuous        06/03/20 2222        Code Status History    Date Active Date Inactive Code Status Order ID Comments User Context   05/26/2020 2017 05/31/2020 2339 Full Code [id number]  Lenore Cordia, MD ED   05/19/2020 2328 05/24/2020 1638 Full Code [id number]  Athena Masse, MD ED   01/31/2018 0205 02/01/2018 2017 Full Code [id number]  Amelia Jo, MD Inpatient   07/12/2017 1333 07/15/2017 1616 Full Code [id number]  Epifanio Lesches, MD ED   Advance Care  Planning Activity       TOTAL TIME TAKING CARE OF THIS PATIENT: *35* minutes.    Fritzi Mandes M.D  [facility]    CC: Primary care physician; Mar Daring, PA-C

## 2020-06-08 NOTE — TOC Transition Note (Addendum)
Transition of Care [facility]) - CM/SW Discharge Note   Patient Details  Name: ADALAI PERL MRN: [id number] Date of Birth: 1941-02-20  Transition [facility] CM/SW Contact:  Candie Chroman, LCSW Phone Number: 06/08/2020, 9:13 AM   Clinical Narrative:  Patient has orders to discharge home with hospice today. Left message on Zandra Abts, RN with [facility]'s confidential voicemail to notify. Looked at [employer] for [employer]. All coupons were around $3000. MD aware. No further concerns. CSW signing off.   Final next level of care: Home w Hospice Care Barriers to Discharge: Barriers Resolved   Patient Goals and CMS Choice        Discharge Placement                Patient to be transferred to facility by: Daughter will pick her up Name of family member notified: Clenton Pare Patient and family notified of of transfer: 06/07/20  Discharge Plan and Services                                     Social Determinants of Health (SDOH) Interventions     Readmission Risk Interventions No flowsheet data found.

## 2020-06-08 NOTE — Care Management Important Message (Signed)
Important Message  Patient Details  Name: Jennifer [name] MRN: [id number] Date of Birth: [birth year]   Medicare Important Message Given:  Yes (Patient on COVID isolation precautions. Gave to RN to put in patient's room yesterday.)     Candie Chroman, LCSW 06/08/2020, 8:21 AM

## 2020-06-09 ENCOUNTER — Telehealth: Payer: Self-pay

## 2020-06-09 ENCOUNTER — Ambulatory Visit: Payer: Self-pay

## 2020-06-09 NOTE — Telephone Encounter (Signed)
Pt was d/c to home with hospice care on 06/08/20. Would you like a TCM call completed?

## 2020-06-09 NOTE — Telephone Encounter (Signed)
I do not think that is necessary in her situation

## 2020-06-10 IMAGING — CR DG CHEST 1V
1 series · 1 of 1 positions shown · non-contrast
Comparison: Chest radiograph dated 07/12/2017

CLINICAL DATA: 69-year-old female with fall and pain.

EXAM:
CHEST  1 VIEW

[dg chest 1 view]
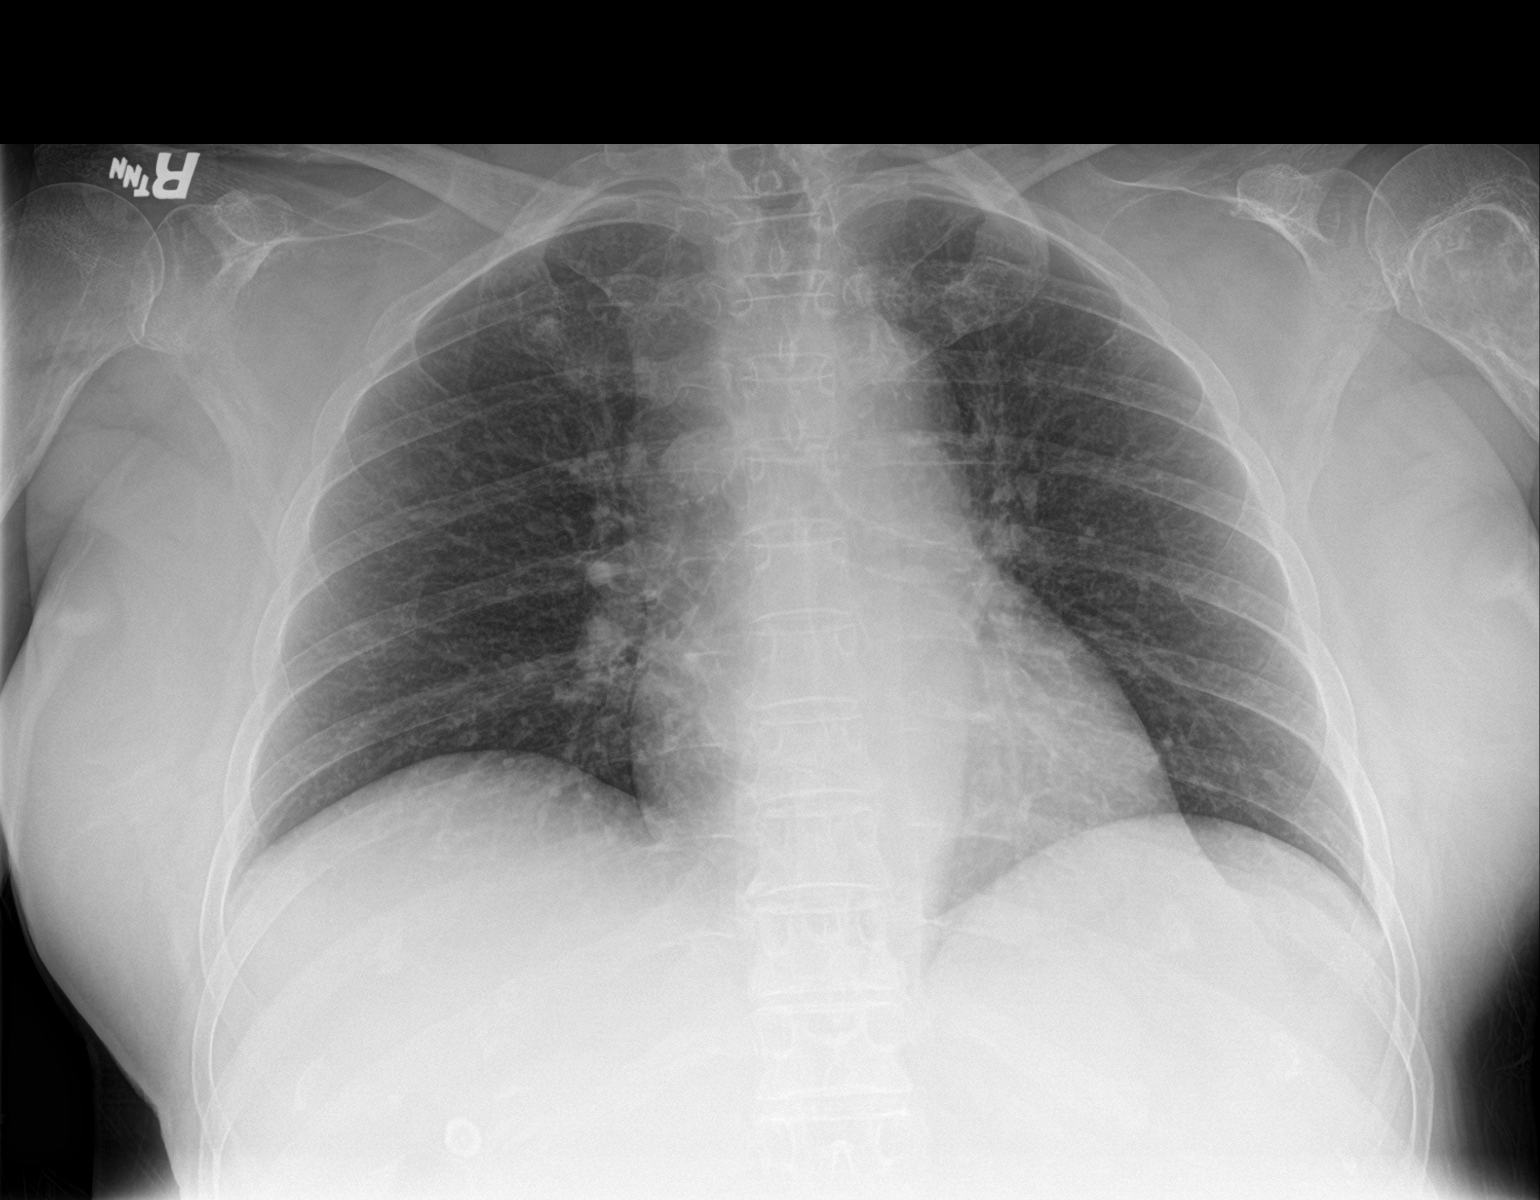

[1 of 1 positions shown; findings below may reference images not displayed]

FINDINGS: The lungs are clear. There is no pleural effusion or pneumothorax.
The cardiac silhouette is within normal limits. Probable small right
hilar granuloma. A 17 mm apparent nodular density in the right
suprahilar region may be artifactual or represent a mildly enlarged
lymph node or partially calcified granuloma. Clinical correlation
and attention follow-up imaging recommended. Chest CT may provide
better evaluation if clinically indicated. Partially visualized
heterogeneous area in the proximal left humerus not well evaluated.
Further evaluation with dedicated radiograph on a nonemergent basis
recommended.
IMPRESSION: No acute cardiopulmonary process.

## 2020-06-11 NOTE — Chronic Care Management (AMB) (Signed)
  Chronic Care Management      Name: Jennifer [name] MRN: [id number] DOB: 1940-11-15  Primary Care Provider: Mar Daring, PA-C   [name] was referred to the case management team and enrolled in the Chronic Care Management program. Received notification that she was transitioned to hospice care following her recent hospitalization on 06/03/20.    PLAN:  Will update enrollment status and complete case closure.     [facility] [phone]

## 2020-06-15 ENCOUNTER — Ambulatory Visit: Payer: Medicare HMO | Admitting: Physician Assistant

## 2020-06-15 ENCOUNTER — Other Ambulatory Visit: Payer: Self-pay | Admitting: Physician Assistant

## 2020-06-15 ENCOUNTER — Encounter: Payer: Self-pay | Admitting: Physician Assistant

## 2020-06-15 DIAGNOSIS — R413 Other amnesia: Secondary | ICD-10-CM

## 2020-06-15 DIAGNOSIS — R569 Unspecified convulsions: Secondary | ICD-10-CM | POA: Diagnosis not present

## 2020-06-15 DIAGNOSIS — K7460 Unspecified cirrhosis of liver: Secondary | ICD-10-CM

## 2020-06-15 DIAGNOSIS — K7581 Nonalcoholic steatohepatitis (NASH): Secondary | ICD-10-CM | POA: Diagnosis not present

## 2020-06-15 DIAGNOSIS — K219 Gastro-esophageal reflux disease without esophagitis: Secondary | ICD-10-CM

## 2020-06-15 DIAGNOSIS — U071 COVID-19: Secondary | ICD-10-CM | POA: Diagnosis not present

## 2020-06-15 DIAGNOSIS — K7290 Hepatic failure, unspecified without coma: Secondary | ICD-10-CM | POA: Diagnosis not present

## 2020-06-15 DIAGNOSIS — K7682 Hepatic encephalopathy: Secondary | ICD-10-CM

## 2020-06-15 NOTE — Progress Notes (Signed)
Virtual telephone visit    Virtual Visit via Telephone Note   This visit type was conducted due to national recommendations for restrictions regarding the COVID-19 Pandemic (e.g. social distancing) in an effort to limit this patient's exposure and mitigate transmission in our community. Due to her co-morbid illnesses, this patient is at least at moderate risk for complications without adequate follow up. This format is felt to be most appropriate for this patient at this time. The patient did not have access to video technology or had technical difficulties with video requiring transitioning to audio format only (telephone). Physical exam was limited to content and character of the telephone converstion.    Patient location: Home Provider location: [facility]  I discussed the limitations of evaluation and management by telemedicine and the availability of in person appointments. The patient expressed understanding and agreed to proceed.   Visit Date: 06/15/2020  Today's healthcare provider: Mar Daring, PA-C   Chief Complaint  Patient presents with  . Hospitalization Follow-up   Subjective    HPI  Follow up Hospitalization  Patient was admitted to [facility] on 06/04/2020 and discharged on 06/08/2020. She was treated for Cirrhosis of liver and Seizure-like activity (hepatic encephalopathy). Also tested positive for Covid on 06/03/2020 Treatment for this included testing for seizures (unremarkable), monitoring with Covid symptoms (no treatment required), continued medications for hepatic encephalopathy. Telephone follow up was done on N/A She reports this condition is improved.  Pt had a question about one of her medications that she takes at bedtime. (She does not know the name of it)  She states it is causing side effects.  She says she will ask her sister the name of it when she gets there later today.     Pt reports she is NOT taking lactulose, Rifaximin,  or Keppra.  ----------------------------------------------------------------------------------------- -      Patient Active Problem List   Diagnosis Date Noted  . COVID-19 virus infection   . Seizure-like activity ([place]) 06/03/2020  . AMS (altered mental status) 05/29/2020  . Acute metabolic encephalopathy   . Ascites   . Hypomagnesemia   . Altered mental status 05/26/2020  . Hyponatremia 05/19/2020  . Chronic hyponatremia 05/19/2020  . Essential hypertension, benign 02/04/2018  . Iron deficiency 02/04/2018  . Pelvic fracture ([place]) 01/31/2018  . Multiple gastric polyps   . Hx of colonic polyps   . Acute upper GI bleed 07/12/2017  . Other cirrhosis of liver ([place])   . Carpal tunnel syndrome on right 09/28/2016  . Obesity (BMI 30.0-34.9) 09/20/2016  . Upper respiratory infection 03/17/2016  . Thrombocytopenia ([place]) 12/29/2015  . Benign neoplasm of ascending colon   . Benign neoplasm of transverse colon   . Benign neoplasm of sigmoid colon   . Allergic rhinitis 10/14/2014  . Airway hyperreactivity 10/14/2014  . Type 2 diabetes mellitus with unspecified complications ([place]) 67/28/9791  . Calculus of gallbladder 10/14/2014  . Esophagitis, reflux 10/14/2014  . Vitamin D deficiency 10/14/2014   Past Medical History:  Diagnosis Date  . Allergy   . Arthritis   . Asthma   . Colon polyp   . Fatty liver   . GERD (gastroesophageal reflux disease)   . Hyperlipidemia   . Hypertension   . Motion sickness    boats   Social History   Tobacco Use  . Smoking status: Former Smoker    Packs/day: 0.50    Years: 6.00    Pack years: 3.00    Types: Cigarettes  Quit date: 05/08/1987    Years since quitting: 33.1  . Smokeless tobacco: Never Used  Vaping Use  . Vaping Use: Never used  Substance Use Topics  . Alcohol use: Never  . Drug use: Never   Allergies  Allergen Reactions  . Amoxicillin Other (See Comments)    Yeast infections  . Codeine Other (See Comments)    Pt  denies  . Hydrocodone-Acetaminophen Other (See Comments)    Pt denies  . Nitrofurantoin Other (See Comments)  . Nitrofurantoin Monohyd Macro     "Sugar got high"  . Penicillins     Has patient had a PCN reaction causing immediate rash, facial/tongue/throat swelling, SOB or lightheadedness with hypotension: Unknown Has patient had a PCN reaction causing severe rash involving mucus membranes or skin necrosis: Unknown Has patient had a PCN reaction that required hospitalization: Unknown Has patient had a PCN reaction occurring within the last 10 years: Unknown If all of the above answers are "NO", then may proceed with Cephalosporin use.  . Sulfa Antibiotics Other (See Comments)    Medications: Outpatient Medications Prior to Visit  Medication Sig  . albuterol (VENTOLIN HFA) 108 (90 Base) MCG/ACT inhaler Inhale 1-2 puffs into the lungs every 6 (six) hours as needed for wheezing or shortness of breath.  . Blood Glucose Monitoring Suppl (ACCU-CHEK AVIVA PLUS) w/Device KIT To check blood sugar daily  . cholecalciferol (VITAMIN D) 25 MCG tablet Take 1 tablet (1,000 Units total) by mouth daily.  . furosemide (LASIX) 20 MG tablet TAKE ONE TABLET EVERY DAY (Patient taking differently: Take 20 mg by mouth daily.)  . glucose blood (ACCU-CHEK AVIVA PLUS) test strip To check blood sugar daily  . lactulose (CHRONULAC) 10 GM/15ML solution Take 30 mLs (20 g total) by mouth 2 (two) times daily.  . Lancets (ACCU-CHEK SOFT TOUCH) lancets To check blood sugar daily  . levETIRAcetam (KEPPRA) 500 MG tablet Take 1 tablet (500 mg total) by mouth 2 (two) times daily.  Marland Kitchen liver oil-zinc oxide (DESITIN) 40 % ointment Apply topically as needed for irritation.  . Magnesium 200 MG TABS Take 1 tablet by mouth daily.  . nadolol (CORGARD) 20 MG tablet TAKE ONE-HALF TABLET BY MOUTH EVERY DAY (Patient taking differently: Take 10 mg by mouth daily.)  . pantoprazole (PROTONIX) 40 MG tablet TAKE ONE TABLET BY MOUTH TWICE DAILY  (Patient taking differently: Take 40 mg by mouth 2 (two) times daily.)  . rifaximin (XIFAXAN) 550 MG TABS tablet Take 1 tablet (550 mg total) by mouth 2 (two) times daily.  Marland Kitchen spironolactone (ALDACTONE) 100 MG tablet TAKE ONE TABLET BY MOUTH EVERY DAY (Patient taking differently: Take 100 mg by mouth daily.)   No facility-administered medications prior to visit.    Review of Systems  Constitutional: Negative.   Respiratory: Negative.   Cardiovascular: Negative.   Gastrointestinal: Positive for diarrhea and nausea.  Psychiatric/Behavioral:       Memory loss      Objective    There were no vitals taken for this visit.     Assessment & Plan     1. Liver cirrhosis secondary to NASH (HCC) Stable. Continue medications as prescribed by GI. F/U with Dr. Marius Ditch on 07/08/20.  2. Hepatic encephalopathy (HCC) Stable. Has acute memory loss most likely secondary to this and seizure like activity. Will refer to Neuro based on hospitalist recommendation for memory loss.  - Ambulatory referral to Neurology  3. Seizure-like activity ([place]) EEG in hospital showed no epilepsy. See above medical  treatment plan. - Ambulatory referral to Neurology  4. COVID-19 virus infection Much Improved. No symptoms.   5. Memory loss Worsening. Referral placed.  - Ambulatory referral to Neurology   No follow-ups on file.    I discussed the assessment and treatment plan with the patient. The patient was provided an opportunity to ask questions and all were answered. The patient agreed with the plan and demonstrated an understanding of the instructions.   The patient was advised to call back or seek an in-person evaluation if the symptoms worsen or if the condition fails to improve as anticipated.  I provided 22 minutes of non-face-to-face time during this encounter.  Reynolds Bowl, PA-C, have reviewed all documentation for this visit. The documentation on 06/15/20 for the exam, diagnosis,  procedures, and orders are all accurate and complete.  [place] [facility] [phone] (phone) [phone] (fax)  [place]

## 2020-06-29 ENCOUNTER — Other Ambulatory Visit: Payer: Self-pay | Admitting: Physician Assistant

## 2020-06-29 DIAGNOSIS — K219 Gastro-esophageal reflux disease without esophagitis: Secondary | ICD-10-CM

## 2020-07-08 ENCOUNTER — Ambulatory Visit: Payer: Medicare HMO | Admitting: Gastroenterology

## 2020-07-08 ENCOUNTER — Other Ambulatory Visit: Payer: Self-pay

## 2020-07-08 ENCOUNTER — Encounter: Payer: Self-pay | Admitting: Gastroenterology

## 2020-07-08 ENCOUNTER — Other Ambulatory Visit
Admission: RE | Admit: 2020-07-08 | Discharge: 2020-07-08 | Disposition: A | Discharge status: Home / Self Care | Source: Ambulatory Visit | Attending: Gastroenterology | Admitting: Gastroenterology

## 2020-07-08 VITALS — BP 117/77 | HR 82 | Temp 97.8°F | Ht 61.0 in | Wt 159.0 lb

## 2020-07-08 DIAGNOSIS — R188 Other ascites: Secondary | ICD-10-CM

## 2020-07-08 DIAGNOSIS — K7581 Nonalcoholic steatohepatitis (NASH): Secondary | ICD-10-CM | POA: Diagnosis not present

## 2020-07-08 DIAGNOSIS — M7989 Other specified soft tissue disorders: Secondary | ICD-10-CM

## 2020-07-08 DIAGNOSIS — K7460 Unspecified cirrhosis of liver: Secondary | ICD-10-CM | POA: Diagnosis not present

## 2020-07-08 DIAGNOSIS — K7290 Hepatic failure, unspecified without coma: Secondary | ICD-10-CM

## 2020-07-08 LAB — CBC
HCT: 36.6 % (ref 36.0–46.0)
Hemoglobin: 12.6 g/dL (ref 12.0–15.0)
MCH: 31.7 pg (ref 26.0–34.0)
MCHC: 34.4 g/dL (ref 30.0–36.0)
MCV: 92.2 fL (ref 80.0–100.0)
Platelets: 96 10*3/uL — ABNORMAL LOW (ref 150–400)
RBC: 3.97 MIL/uL (ref 3.87–5.11)
RDW: 15.5 % (ref 11.5–15.5)
WBC: 4.6 10*3/uL (ref 4.0–10.5)
nRBC: 0 % (ref 0.0–0.2)

## 2020-07-08 LAB — VITAMIN B12: Vitamin B-12: 771 pg/mL (ref 180–914)

## 2020-07-08 LAB — COMPREHENSIVE METABOLIC PANEL
ALT: 18 U/L (ref 0–44)
AST: 37 U/L (ref 15–41)
Albumin: 3.2 g/dL — ABNORMAL LOW (ref 3.5–5.0)
Alkaline Phosphatase: 82 U/L (ref 38–126)
Anion gap: 9 (ref 5–15)
BUN: 11 mg/dL (ref 8–23)
CO2: 28 mmol/L (ref 22–32)
Calcium: 9.3 mg/dL (ref 8.9–10.3)
Chloride: 93 mmol/L — ABNORMAL LOW (ref 98–111)
Creatinine, Ser: 0.89 mg/dL (ref 0.44–1.00)
GFR, Estimated: >60 mL/min (ref >60)
Glucose, Bld: 105 mg/dL — ABNORMAL HIGH (ref 70–99)
Potassium: 4.2 mmol/L (ref 3.5–5.1)
Sodium: 130 mmol/L — ABNORMAL LOW (ref 135–145)
Total Bilirubin: 1.8 mg/dL — ABNORMAL HIGH (ref 0.3–1.2)
Total Protein: 6.8 g/dL (ref 6.5–8.1)

## 2020-07-08 LAB — VITAMIN D 25 HYDROXY (VIT D DEFICIENCY, FRACTURES): Vit D, 25-Hydroxy: 75.82 ng/mL (ref 30–100)

## 2020-07-08 LAB — FOLATE: Folate: 13.7 ng/mL (ref >5.9)

## 2020-07-08 LAB — MAGNESIUM: Magnesium: 1.4 mg/dL — ABNORMAL LOW (ref 1.7–2.4)

## 2020-07-08 MED ORDER — FUROSEMIDE 40 MG PO TABS: 40.0000 mg | ORAL_TABLET | Freq: Every day | ORAL | 1 refills | Status: DC

## 2020-07-08 NOTE — Patient Instructions (Addendum)
1. Increase Lasix 27m and take 1 tablet of Spironlactone

## 2020-07-08 NOTE — Progress Notes (Signed)
Cephas Darby, MD [address]  [place]  [place], [place] [address]  Main: [phone]  Fax: [phone]    Gastroenterology Consultation  Referring Provider:     Florian Buff* Primary Care Physician:  Mar Daring, PA-C Primary Gastroenterologist:  Dr. Cephas Darby Reason for Consultation:   Decompensated NASH Cirrhosis        HPI:   Jennifer [name] is a 80 y.o. female referred by Dr. Marlyn Corporal, Clearnce Sorrel, PA-C  for consultation & management of decompensated cirrhosis. Patient has history of metabolic syndrome. Patient was recently admitted to the hospital about 2 weeks ago with variceal bleed,and massive ascites, underwent EGD and ligation of esophageal varices. She had therapeutic paracentesis, fluid analysis consistent with portal hypertension and there as no evidence of SBP. She was temporarily intubated in the setting of massive upper GI bleed. She was discharged home on spironolactone, Lasix, nadolol and pantoprazole. She is known to have cirrhosis since 2013. She had CBC, CMP after discharge which are stable. She is here for hospital follow-up today. She continues to have abdominal distention, reports that swelling of legs has significantly improved, otherwise feeling well. She reports having loose nonbloody stools which is chronic. She denies hematemesis, melena, rectal bleeding, abdominal pain, nausea, vomiting. She recently had an ultrasound which revealed only trace ascites. Hep B surface antigen is negative, HCV antibody negative in 2017, ferritin 22 in 07/2016. She does not have a regular gi f/u for cirrhosis, and not on diuretics. She denies drinking ETOH. She reports that she stopped taking metformin and has been checking her blood glucose levels at home 4 times daily and numbers have been below 120  Follow-up visit 09/11/2017 Patient lost about 25 pounds since last visit after aggressive diuresis. I increased her Lasix to 40 mg and spironolactone  100 mg. Patient also has pain restricting her sodium intake in her diet. She reports feeling well. Her abdominal distention and swelling of legs have completely resolved. She reports feeling slightly dizzy when she gets up in the morning. She is on nadolol for secondary prophylaxis. She denies any complaints today. She reports that her energy levels are better. She is taking oral iron. She denies black stools or rectal bleeding, hematemesis. Her most recent labs about 2 weeks ago revealed normal hemoglobin, mild thrombocytopenia. Her LFTs are almost normal, hypoalbuminemia resolved. She has preserved kidney function and electrolytes are normal  Follow-up visit 11/16/2017 She reports feeling well. She denies abdominal distention or swelling of legs. She continues to take Lasix 40 mg and spironolactone 100 mg daily. She is taking Protonix daily, and nadolol. Her last A1c was 6. She's not taking oral iron any more. She is no longer anemic. I performed her last EGD about 2 weeks ago, showed large esophageal varices with no red well signs. There was significant scarring from prior banding, therefore I did not perform ligation.  Follow-up visit 03/27/2018 Patient had right pelvic fracture after mechanical fall in 01/2018.  He did not undergo surgery per orthopedics.  She was undergoing PT at skilled nursing facility.  Patient reported that her diuretics were held at rehab, resulted in severe swelling of legs as well as abdominal distention, gained about 15 pounds.  She was restarted back on diuretics and her swelling resolved, she lost about 10 pounds on diuretics.  Currently, she is taking Lasix 20 mg daily and spironolactone 100 mg daily.  She reports doing well.  Taking oral iron 1 pill daily.  Denies hematochezia, melena, hematemesis or abdominal pain.  Follow-up visit 07/08/2020 Patient was admitted to [facility] in January secondary to severe hyponatremia resulting in altered mental status, loss of consciousness.   This has resolved.  She then got readmitted secondary to seizure-like episode and she started on Keppra.  She has appointment to see neurology in April.  Patient reports that she has been doing very well since discharge.  She does report mild abdominal distention, denies any swelling of legs.  She is taking Lasix 20 mg daily and spironolactone 100 mg daily.  She denies any black stools, abdominal pain, rectal bleeding, nausea, vomiting.  Her weight has been stable.  She is trying to maintain low-sodium diet.  She is not taking rifaximin or lactulose.  She reports having 3-4 bowel movements daily.  Patient is also evaluated by IR for TIPS placement.  Patient reports that she chose not to undergo the procedure because she had a dream that said not to undergo procedure and she decided not to proceed with it.  NSAIDs: none  Antiplts/Anticoagulants/Anti thrombotics: none  GI Procedures:   EGD 01/17/18 - Normal duodenal bulb and second portion of the duodenum. - Portal hypertensive gastropathy. - A few gastric polyps.resected, Clip (MR conditional) was placed. - Large (> 5 mm) esophageal varices, banding was not performed due to device malfunction.  DIAGNOSIS:  A. STOMACH POLYPS X2, BODY; HOT SNARE:  - HYPERPLASTIC TYPE GASTRIC POLYP.  - NEGATIVE FOR H. PYLORI, DYSPLASIA, AND MALIGNANCY.   For EGD 11/01/2017 - Normal duodenal bulb and second portion of the duodenum. - Portal hypertensive gastropathy. - A few gastric polyps. - Large (> 5 mm) esophageal varices. - No specimens collected.  EGD 09/20/2017 - Normal duodenal bulb and second portion of the duodenum. - Portal hypertensive gastropathy. - Recently bleeding large (> 5 mm) esophageal varices. Incompletely eradicated. Bandedx3. - No specimens collected.  EGD 08/16/2017 - Normal duodenal bulb and second portion of the duodenum. - Portal hypertensive gastropathy. - Recently bleeding large (> 5 mm) esophageal varices. Incompletely  eradicated. Banded x 4. - No specimens collected.  EGD 07/12/2017 - Normal duodenal bulb and second portion of the duodenum. - Portal hypertensive gastropathy. - Bleeding large (> 5 mm) esophageal varices. Incompletely eradicated. Banded. - No specimens collected.  Colonoscopy 04/03/2005 normal  Colonoscopy 02/01/2015 - One 8 mm polyp in the ascending colon. Resected and retrieved. Clips were placed. - One 4 mm polyp in the transverse colon. Resected and retrieved. - One 7 mm polyp in the sigmoid colon. Resected and retrieved. Clip was placed. - Non-bleeding internal hemorrhoids. 1. Colon, polyp(s), ascending colon polyp - SESSILE SERRATED POLYP. - NO DYSPLASIA OR MALIGNANCY. 2. Colon, polyp(s), transverse colon polyp - TUBULAR ADENOMA. - NO HIGH GRADE DYSPLASIA OR MALIGNANCY. 3. Colon, polyp(s), sigmoid colon polyp - TUBULAR ADENOMA. - NO HIGH GRADE DYSPLASIA OR MALIGNANCY.  Colonoscopy 11/22/2017 - One 5 mm polyp in the proximal ascending colon, removed with a cold biopsy forceps. Resected and retrieved. - Rectal varices. - Non-bleeding internal hemorrhoids. DIAGNOSIS:  A. COLON POLYP X 1, ASCENDING; COLD BIOPSY:  - TUBULAR ADENOMA.  - NEGATIVE FOR HIGH-GRADE DYSPLASIA AND MALIGNANCY.   Past Medical History:  Diagnosis Date  . Allergy   . Arthritis   . Asthma   . Colon polyp   . Fatty liver   . GERD (gastroesophageal reflux disease)   . Hyperlipidemia   . Hypertension   . Motion sickness    boats    Past Surgical  History:  Procedure Laterality Date  . ABDOMINAL HYSTERECTOMY  1978  . COLONOSCOPY WITH PROPOFOL N/A 02/01/2015   Procedure: COLONOSCOPY WITH PROPOFOL;  Surgeon: Lucilla Lame, MD;  Location: [facility];  Service: Endoscopy;  Laterality: N/A;  Diabetic - oral meds  . COLONOSCOPY WITH PROPOFOL N/A 11/22/2017   Procedure: COLONOSCOPY WITH PROPOFOL;  Surgeon: Lin Landsman, MD;  Location: [facility] ENDOSCOPY;  Service: Gastroenterology;   Laterality: N/A;  . ESOPHAGOGASTRODUODENOSCOPY (EGD) WITH PROPOFOL N/A 07/12/2017   Procedure: ESOPHAGOGASTRODUODENOSCOPY (EGD) WITH PROPOFOL;  Surgeon: Lin Landsman, MD;  Location: [facility] ENDOSCOPY;  Service: Gastroenterology;  Laterality: N/A;  . ESOPHAGOGASTRODUODENOSCOPY (EGD) WITH PROPOFOL N/A 08/16/2017   Procedure: ESOPHAGOGASTRODUODENOSCOPY (EGD) WITH PROPOFOL;  Surgeon: Lin Landsman, MD;  Location: [facility] ENDOSCOPY;  Service: Gastroenterology;  Laterality: N/A;  . ESOPHAGOGASTRODUODENOSCOPY (EGD) WITH PROPOFOL N/A 09/20/2017   Procedure: ESOPHAGOGASTRODUODENOSCOPY (EGD) WITH PROPOFOL;  Surgeon: Lin Landsman, MD;  Location: [facility] ENDOSCOPY;  Service: Gastroenterology;  Laterality: N/A;  . ESOPHAGOGASTRODUODENOSCOPY (EGD) WITH PROPOFOL N/A 11/01/2017   Procedure: ESOPHAGOGASTRODUODENOSCOPY (EGD) WITH PROPOFOL;  Surgeon: Lin Landsman, MD;  [facility] ENDOSCOPY;  Service: Gastroenterology;  Laterality: N/A;  . ESOPHAGOGASTRODUODENOSCOPY (EGD) WITH PROPOFOL N/A 01/17/2018   Procedure: ESOPHAGOGASTRODUODENOSCOPY (EGD) WITH PROPOFOL;  Surgeon: Lin Landsman, MD;  Location: [facility] ENDOSCOPY;  Service: Gastroenterology;  Laterality: N/A;  . ESOPHAGOGASTRODUODENOSCOPY (EGD) WITH PROPOFOL N/A 06/20/2018   Procedure: ESOPHAGOGASTRODUODENOSCOPY (EGD) WITH PROPOFOL;  Surgeon: Lin Landsman, MD;  Location: [facility] ENDOSCOPY;  Service: Gastroenterology;  Laterality: N/A;  EGD/ with banding ligation   . IR RADIOLOGIST EVAL & MGMT  07/09/2018  . POLYPECTOMY  02/01/2015   Procedure: POLYPECTOMY;  Surgeon: Lucilla Lame, MD;  Location: [place];  Service: Endoscopy;;  . TONSILLECTOMY AND ADENOIDECTOMY       Current Outpatient Medications:  .  albuterol (VENTOLIN HFA) 108 (90 Base) MCG/ACT inhaler, Inhale 1-2 puffs into the lungs every 6 (six) hours as needed for wheezing or shortness of breath., Disp: 18 g, Rfl: 1 .  Blood Glucose Monitoring Suppl (ACCU-CHEK AVIVA PLUS)  w/Device KIT, To check blood sugar daily, Disp: 1 kit, Rfl: 0 .  cholecalciferol (VITAMIN D) 25 MCG tablet, Take 1 tablet (1,000 Units total) by mouth daily., Disp: 30 tablet, Rfl: 0 .  furosemide (LASIX) 40 MG tablet, Take 1 tablet (40 mg total) by mouth daily., Disp: 90 tablet, Rfl: 1 .  glucose blood (ACCU-CHEK AVIVA PLUS) test strip, To check blood sugar daily, Disp: 100 each, Rfl: 3 .  Lancets (ACCU-CHEK SOFT TOUCH) lancets, To check blood sugar daily, Disp: 100 each, Rfl: 12 .  liver oil-zinc oxide (DESITIN) 40 % ointment, Apply topically as needed for irritation., Disp: 56.7 g, Rfl: 0 .  Magnesium 200 MG TABS, Take 1 tablet by mouth daily., Disp: , Rfl:  .  nadolol (CORGARD) 20 MG tablet, TAKE ONE-HALF TABLET BY MOUTH EVERY DAY (Patient taking differently: Take 10 mg by mouth daily.), Disp: 45 tablet, Rfl: 3 .  pantoprazole (PROTONIX) 40 MG tablet, TAKE ONE (1) TABLET BY MOUTH TWO TIMES PER DAY, Disp: 180 tablet, Rfl: 0 .  spironolactone (ALDACTONE) 100 MG tablet, TAKE ONE TABLET BY MOUTH EVERY DAY (Patient taking differently: Take 100 mg by mouth daily.), Disp: 90 tablet, Rfl: 1 .  lactulose (CHRONULAC) 10 GM/15ML solution, Take 30 mLs (20 g total) by mouth 2 (two) times daily. (Patient not taking: No sig reported), Disp: 1892 mL, Rfl: 1 .  levETIRAcetam (KEPPRA) 500 MG tablet, Take 1  tablet (500 mg total) by mouth 2 (two) times daily. (Patient not taking: No sig reported), Disp: 60 tablet, Rfl: 1 .  rifaximin (XIFAXAN) 550 MG TABS tablet, Take 1 tablet (550 mg total) by mouth 2 (two) times daily. (Patient not taking: No sig reported), Disp: 60 tablet, Rfl: 1   Family History  Problem Relation Age of Onset  . Dementia Mother   . Alcohol abuse Father   . Cancer Sister        lung  . Lymphoma Sister   . Melanoma Sister   . Lymphoma Sister      Social History   Tobacco Use  . Smoking status: Former Smoker    Packs/day: 0.50    Years: 6.00    Pack years: 3.00    Types: Cigarettes     Quit date: 05/08/1987    Years since quitting: 33.1  . Smokeless tobacco: Never Used  Vaping Use  . Vaping Use: Never used  Substance Use Topics  . Alcohol use: Never  . Drug use: Never    Allergies as of 07/08/2020 - Review Complete 07/08/2020  Allergen Reaction Noted  . Amoxicillin Other (See Comments) 07/17/2018  . Codeine Other (See Comments) 10/14/2014  . Hydrocodone-acetaminophen Other (See Comments) 10/14/2014  . Nitrofurantoin Other (See Comments) 11/05/2014  . Nitrofurantoin monohyd macro  10/14/2014  . Penicillins  10/14/2014  . Sulfa antibiotics Other (See Comments) 10/14/2014    Review of Systems:    All systems reviewed and negative except where noted in HPI.   Physical Exam:  BP 117/77 (BP Location: Left Arm, Patient Position: Sitting, Cuff Size: Normal)   Pulse 82   Temp 97.8 F (36.6 C) (Oral)   Ht 5' 1"  (1.549 m)   Wt 159 lb (72.1 kg)   BMI 30.04 kg/m  No LMP recorded. Patient has had a hysterectomy.  General:   Alert,  Well-developed, well-nourished, pleasant and cooperative in NAD Head:  Normocephalic and atraumatic. Eyes:  Sclera clear, no icterus.   Conjunctiva pink. Ears:  Normal auditory acuity. Nose:  No deformity, discharge, or lesions. Mouth:  No deformity or lesions,oropharynx pink & moist. Neck:  Supple; no masses or thyromegaly. Lungs:  Respirations even and unlabored.  Clear throughout to auscultation.   No wheezes, crackles, or rhonchi. No acute distress. Heart:  Regular rate and rhythm; no murmurs, clicks, rubs, or gallops. Abdomen:  Normal bowel sounds. Soft, non-tender and mildly distended, no hepatosplenomegaly.  No guarding or rebound tenderness.   Rectal: Not performed Msk:  Symmetrical without gross deformities. Good, equal movement & strength bilaterally. Pulses:  Normal pulses noted. Extremities:  No clubbing, no edema.  No cyanosis. Neurologic:  Alert and oriented x3;  grossly normal neurologically. Skin:  Intact without  significant lesions or rashes. No jaundice. Psych:  Alert and cooperative. Normal mood and affect.  Imaging Studies: reviewed  Assessment and Plan:   Jennifer [name] is a 80 y.o. Caucasian female with metabolic syndrome, diabetes, hypertension, hyperlipidemia seen for follow-up of decompensated cirrhosis with ascites and variceal bleed.  Decompensated cirrhosis: probably secondary to NASH in the setting of metabolic syndrome Hepatitis B and C negative, received hepatitis B vaccine in 2013. Normal ferritin levels Portal hypertension: manifested as ascites, thrombocytopenia, esophageal varices Volume overload: Patient is requiring periodic therapeutic paracentesis due to recurrence of ascites, Continue spironolactone 100 mg, and increase Lasix to 40 mg daily, continue low-sodium diet.  She refused TIPS Recheck labs Esophageal varices: status post banding on 07/12/2017. EGD in  08/2017, ligation performed.  EGD in 10/2017, ligation was not performed due to extensive scarring from prior banding. Continue nadolol, patient is currently taking 10 mg daily, Attempted variceal banding in 01/2018, unsuccessful due to device malfunction. Repeat EGD in 06/2018, banding was not performed due to extensive scarring.  She was referred to IR for TIPS placement and she was deemed to be a candidate for it however, patient decided not to proceed with it and she is persistent about this decision.  Anemia/thrombocytopenia/coagulopathy: Recurrence of anemia,does not have B12 or iron deficiency, mild thrombocytopenia and stable, recheck labs today PSE: None HCC surveillance: No evidence of liver lesions based on imaging, up-to-date HRS: None She is immune to hepatitis A and B, received vaccinations in 2013 Received Pneumovax and prevnar  History of tubular adenomas:  Repeat surveillance colonoscopy 11/2022   Follow up in 3 months   Cephas Darby, MD

## 2020-07-09 ENCOUNTER — Telehealth: Payer: Self-pay

## 2020-07-09 DIAGNOSIS — M7989 Other specified soft tissue disorders: Secondary | ICD-10-CM

## 2020-07-09 MED ORDER — SPIRONOLACTONE 50 MG PO TABS: 50.0000 mg | ORAL_TABLET | Freq: Every day | ORAL | 0 refills | Status: DC

## 2020-07-09 NOTE — Telephone Encounter (Signed)
-----   Message from Lin Landsman, MD sent at 07/08/2020  4:56 PM EST ----- Recommend to increase spironolactone to 150 mg daily as her sodium levels  are slightly below normal, recommend to recheck sodium in 1 week  RV

## 2020-07-09 NOTE — Telephone Encounter (Signed)
Patient verbalized understanding of results. Sent 63m to go along with the 1065m Order CMP for 1 week. She will get labs in 1 week

## 2020-07-15 ENCOUNTER — Other Ambulatory Visit
Admission: RE | Admit: 2020-07-15 | Discharge: 2020-07-15 | Disposition: A | Discharge status: Home / Self Care | Payer: Medicare HMO | Source: Ambulatory Visit | Attending: Gastroenterology | Admitting: Gastroenterology

## 2020-07-15 DIAGNOSIS — M7989 Other specified soft tissue disorders: Secondary | ICD-10-CM | POA: Diagnosis not present

## 2020-07-15 LAB — COMPREHENSIVE METABOLIC PANEL
ALT: 15 U/L (ref 0–44)
AST: 30 U/L (ref 15–41)
Albumin: 3.2 g/dL — ABNORMAL LOW (ref 3.5–5.0)
Alkaline Phosphatase: 76 U/L (ref 38–126)
Anion gap: 8 (ref 5–15)
BUN: 10 mg/dL (ref 8–23)
CO2: 30 mmol/L (ref 22–32)
Calcium: 9.1 mg/dL (ref 8.9–10.3)
Chloride: 93 mmol/L — ABNORMAL LOW (ref 98–111)
Creatinine, Ser: 1.06 mg/dL — ABNORMAL HIGH (ref 0.44–1.00)
GFR, Estimated: 53 mL/min — ABNORMAL LOW (ref >60)
Glucose, Bld: 121 mg/dL — ABNORMAL HIGH (ref 70–99)
Potassium: 4.2 mmol/L (ref 3.5–5.1)
Sodium: 131 mmol/L — ABNORMAL LOW (ref 135–145)
Total Bilirubin: 1.9 mg/dL — ABNORMAL HIGH (ref 0.3–1.2)
Total Protein: 6.4 g/dL — ABNORMAL LOW (ref 6.5–8.1)

## 2020-07-19 ENCOUNTER — Telehealth: Payer: Self-pay | Admitting: Gastroenterology

## 2020-07-19 DIAGNOSIS — M7989 Other specified soft tissue disorders: Secondary | ICD-10-CM

## 2020-07-19 NOTE — Telephone Encounter (Signed)
Patient called wanting lab results. 

## 2020-07-19 NOTE — Telephone Encounter (Signed)
Patient verbalized understanding and will go for lab work in 1 week

## 2020-07-19 NOTE — Telephone Encounter (Signed)
Labs look stable, her sodium levels remain stable.  Recheck BMP in 1 week, continue Lasix 40 mg and spironolactone 150 mg daily  RV

## 2020-07-19 NOTE — Telephone Encounter (Signed)
Please advised  

## 2020-07-19 NOTE — Addendum Note (Signed)
Addended by: Ulyess Blossom L on: 07/19/2020 01:02 PM   Modules accepted: Orders

## 2020-07-22 ENCOUNTER — Other Ambulatory Visit: Payer: Medicare HMO

## 2020-07-22 ENCOUNTER — Telehealth: Payer: Self-pay

## 2020-07-22 ENCOUNTER — Other Ambulatory Visit
Admission: RE | Admit: 2020-07-22 | Discharge: 2020-07-22 | Disposition: A | Discharge status: Home / Self Care | Payer: Medicare HMO | Source: Ambulatory Visit | Attending: Gastroenterology | Admitting: Gastroenterology

## 2020-07-22 ENCOUNTER — Ambulatory Visit: Payer: Medicare HMO | Admitting: Internal Medicine

## 2020-07-22 DIAGNOSIS — M7989 Other specified soft tissue disorders: Secondary | ICD-10-CM | POA: Diagnosis not present

## 2020-07-22 LAB — BASIC METABOLIC PANEL
Anion gap: 9 (ref 5–15)
BUN: 10 mg/dL (ref 8–23)
CO2: 26 mmol/L (ref 22–32)
Calcium: 9.1 mg/dL (ref 8.9–10.3)
Chloride: 94 mmol/L — ABNORMAL LOW (ref 98–111)
Creatinine, Ser: 0.79 mg/dL (ref 0.44–1.00)
GFR, Estimated: >60 mL/min (ref >60)
Glucose, Bld: 144 mg/dL — ABNORMAL HIGH (ref 70–99)
Potassium: 4.8 mmol/L (ref 3.5–5.1)
Sodium: 129 mmol/L — ABNORMAL LOW (ref 135–145)

## 2020-07-22 NOTE — Telephone Encounter (Signed)
-----   Message from Lin Landsman, MD sent at 07/22/2020  4:43 PM EDT ----- Recommend to decrease spironolactone to 100 mg daily Continue Lasix 40 mg daily  RV

## 2020-07-22 NOTE — Telephone Encounter (Signed)
Called and left a message for call back  

## 2020-07-22 NOTE — Telephone Encounter (Signed)
Patient verbalized understanding of lab results

## 2020-07-26 ENCOUNTER — Inpatient Hospital Stay (HOSPITAL_BASED_OUTPATIENT_CLINIC_OR_DEPARTMENT_OTHER): Admitting: Internal Medicine

## 2020-07-26 ENCOUNTER — Other Ambulatory Visit: Payer: Self-pay

## 2020-07-26 ENCOUNTER — Telehealth: Payer: Self-pay | Admitting: Internal Medicine

## 2020-07-26 ENCOUNTER — Inpatient Hospital Stay: Attending: Internal Medicine

## 2020-07-26 DIAGNOSIS — D731 Hypersplenism: Secondary | ICD-10-CM | POA: Insufficient documentation

## 2020-07-26 DIAGNOSIS — D649 Anemia, unspecified: Secondary | ICD-10-CM | POA: Insufficient documentation

## 2020-07-26 DIAGNOSIS — Z87891 Personal history of nicotine dependence: Secondary | ICD-10-CM | POA: Insufficient documentation

## 2020-07-26 DIAGNOSIS — E871 Hypo-osmolality and hyponatremia: Secondary | ICD-10-CM | POA: Diagnosis not present

## 2020-07-26 DIAGNOSIS — E119 Type 2 diabetes mellitus without complications: Secondary | ICD-10-CM | POA: Insufficient documentation

## 2020-07-26 DIAGNOSIS — D696 Thrombocytopenia, unspecified: Secondary | ICD-10-CM | POA: Insufficient documentation

## 2020-07-26 DIAGNOSIS — I10 Essential (primary) hypertension: Secondary | ICD-10-CM | POA: Insufficient documentation

## 2020-07-26 DIAGNOSIS — K7290 Hepatic failure, unspecified without coma: Secondary | ICD-10-CM

## 2020-07-26 DIAGNOSIS — K7581 Nonalcoholic steatohepatitis (NASH): Secondary | ICD-10-CM

## 2020-07-26 DIAGNOSIS — Z79899 Other long term (current) drug therapy: Secondary | ICD-10-CM | POA: Diagnosis not present

## 2020-07-26 DIAGNOSIS — K7460 Unspecified cirrhosis of liver: Secondary | ICD-10-CM | POA: Diagnosis not present

## 2020-07-26 LAB — CBC WITH DIFFERENTIAL/PLATELET
Abs Immature Granulocytes: 0 10*3/uL (ref 0.00–0.07)
Basophils Absolute: 0 10*3/uL (ref 0.0–0.1)
Basophils Relative: 0 %
Eosinophils Absolute: 0.1 10*3/uL (ref 0.0–0.5)
Eosinophils Relative: 2 %
HCT: 36.1 % (ref 36.0–46.0)
Hemoglobin: 12.1 g/dL (ref 12.0–15.0)
Immature Granulocytes: 0 %
Lymphocytes Relative: 18 %
Lymphs Abs: 0.5 10*3/uL — ABNORMAL LOW (ref 0.7–4.0)
MCH: 31.3 pg (ref 26.0–34.0)
MCHC: 33.5 g/dL (ref 30.0–36.0)
MCV: 93.3 fL (ref 80.0–100.0)
Monocytes Absolute: 0.2 10*3/uL (ref 0.1–1.0)
Monocytes Relative: 8 %
Neutro Abs: 1.9 10*3/uL (ref 1.7–7.7)
Neutrophils Relative %: 72 %
Platelets: 67 10*3/uL — ABNORMAL LOW (ref 150–400)
RBC: 3.87 MIL/uL (ref 3.87–5.11)
RDW: 14.6 % (ref 11.5–15.5)
WBC: 2.6 10*3/uL — ABNORMAL LOW (ref 4.0–10.5)
nRBC: 0 % (ref 0.0–0.2)

## 2020-07-26 LAB — PROTIME-INR
INR: 1.3 — ABNORMAL HIGH (ref 0.8–1.2)
Prothrombin Time: 15.8 seconds — ABNORMAL HIGH (ref 11.4–15.2)

## 2020-07-26 LAB — COMPREHENSIVE METABOLIC PANEL
ALT: 15 U/L (ref 0–44)
AST: 30 U/L (ref 15–41)
Albumin: 3.1 g/dL — ABNORMAL LOW (ref 3.5–5.0)
Alkaline Phosphatase: 71 U/L (ref 38–126)
Anion gap: 9 (ref 5–15)
BUN: 13 mg/dL (ref 8–23)
CO2: 27 mmol/L (ref 22–32)
Calcium: 9 mg/dL (ref 8.9–10.3)
Chloride: 92 mmol/L — ABNORMAL LOW (ref 98–111)
Creatinine, Ser: 0.92 mg/dL (ref 0.44–1.00)
GFR, Estimated: >60 mL/min (ref >60)
Glucose, Bld: 205 mg/dL — ABNORMAL HIGH (ref 70–99)
Potassium: 4 mmol/L (ref 3.5–5.1)
Sodium: 128 mmol/L — ABNORMAL LOW (ref 135–145)
Total Bilirubin: 1.7 mg/dL — ABNORMAL HIGH (ref 0.3–1.2)
Total Protein: 6.4 g/dL — ABNORMAL LOW (ref 6.5–8.1)

## 2020-07-26 LAB — APTT: aPTT: 31 seconds (ref 24–36)

## 2020-07-26 NOTE — Telephone Encounter (Signed)
On 3/21-I spoke to patient's sister Ria Comment regarding overall poor prognosis of patient's decompensated cirrhosis.  Suspect patient intermittent mental status changes-related to hepatic encephalopathy.  However I would defer prognostication from cirrhosis stand point to GI. Also discussed that pt's sodium is still slightly at 128/however stable from underlying cirrhosis/diuretics.  FYI-Dr.Vanga.

## 2020-07-26 NOTE — Assessment & Plan Note (Signed)
#   Thrombocytopenia secondary to hypersplenism from splenomegaly.  Platelets-53 ; trending down over the last 5 years, but overall - STABLE  If platelets less than 30/spontaneous bleeding-would consider thrombopoietin stimulating agents.  # mild anemia/ leucopenia- sec to cirrhosis-no infections; asymtomatic-STABLE.    # Cirrhosis-child pugh-C/recent decompensation s/p paracentesis; declined TIPs; awaiting AFP.  US abdomen for [facility] surveillance- Jan 2022- ascites/ NO Liver lesions.   # Hyponatremia- Sodium 128- sec to Cirrhosis/STABLE. Defer to PCP/GI>   # DISPOSITION: #  follow up 6 months- MD/labs-CBC CMP PT PTT AFP-Dr.B

## 2020-07-26 NOTE — Progress Notes (Signed)
[place] NOTE  Patient Care Team: Rubye Beach as PCP - General (Family Medicine) Bary Castilla, Forest Gleason, MD (General Surgery) Anell Barr, OD as Consulting Physician (Optometry) Cammie Sickle, MD as Consulting Physician (Internal Medicine) Lin Landsman, MD as Consulting Physician (Gastroenterology) Sandi Mariscal, MD as Consulting Physician (Interventional Radiology) Germaine Pomfret, [facility] as Pharmacist (Pharmacist)  CHIEF COMPLAINTS/PURPOSE OF CONSULTATION:   # THROMBOCYTOPENIA- [2015-100s-80s; 2017] N-Hb/WBC;  Hypersplenism/splenomegaly; no clumping.   #  Cirrhosis/ splenomegaly [US- 2016; stable since 2013]; Hep B/Hep C negative on 12/29/15; DECOMPENSATION of CIRRHOSIS [feb 2019; Dr.Vanga]-declined TIPS  HISTORY OF PRESENTING ILLNESS:  Jennifer [name] 80 y.o.  female Caucasian patient history of thrombocytopenia/compensated cirrhosis/splenomegaly [based on imaging] is here for follow-up.  In the interim patient was admitted to hospital in January 2022 for decompensation of cirrhosis.  Patient had paracentesis done.  Patient continues to be on diuretics.  She also noted to have hyponatremia sodium 120s to 130s.  Patient sodium is checked every week-GI.  Patient denies any worsening swelling in the legs.  Easy bruising but no bleeding.  No abdominal pain.  Review of Systems  Constitutional: Positive for malaise/fatigue. Negative for chills, diaphoresis, fever and weight loss.  HENT: Negative for nosebleeds and sore throat.   Eyes: Negative for double vision.  Respiratory: Negative for cough, hemoptysis, sputum production, shortness of breath and wheezing.   Cardiovascular: Negative for chest pain, palpitations, orthopnea and leg swelling.  Gastrointestinal: Negative for abdominal pain, blood in stool, constipation, diarrhea, heartburn, melena, nausea and vomiting.  Genitourinary: Negative for dysuria, frequency and urgency.   Musculoskeletal: Positive for myalgias and neck pain. Negative for back pain and joint pain.  Skin: Negative.  Negative for itching and rash.  Neurological: Negative for dizziness, tingling, focal weakness, weakness and headaches.  Endo/Heme/Allergies: Bruises/bleeds easily.  Psychiatric/Behavioral: Negative for depression. The patient is not nervous/anxious and does not have insomnia.      MEDICAL HISTORY:  Past Medical History:  Diagnosis Date  . Allergy   . Arthritis   . Asthma   . Colon polyp   . Fatty liver   . GERD (gastroesophageal reflux disease)   . Hyperlipidemia   . Hypertension   . Motion sickness    boats    SURGICAL HISTORY: Past Surgical History:  Procedure Laterality Date  . ABDOMINAL HYSTERECTOMY  1978  . COLONOSCOPY WITH PROPOFOL N/A 02/01/2015   Procedure: COLONOSCOPY WITH PROPOFOL;  Surgeon: Lucilla Lame, MD;  Location: [place];  Service: Endoscopy;  Laterality: N/A;  Diabetic - oral meds  . COLONOSCOPY WITH PROPOFOL N/A 11/22/2017   Procedure: COLONOSCOPY WITH PROPOFOL;  Surgeon: Lin Landsman, MD;  Location[facility] ENDOSCOPY;  Service: Gastroenterology;  Laterality: N/A;  . ESOPHAGOGASTRODUODENOSCOPY (EGD) WITH PROPOFOL N/A 07/12/2017   Procedure: ESOPHAGOGASTRODUODENOSCOPY (EGD) WITH PROPOFOL;  Surgeon: Lin Landsman, MD;  Location: [facility] ENDOSCOPY;  Service: Gastroenterology;  Laterality: N/A;  . ESOPHAGOGASTRODUODENOSCOPY (EGD) WITH PROPOFOL N/A 08/16/2017   Procedure: ESOPHAGOGASTRODUODENOSCOPY (EGD) WITH PROPOFOL;  Surgeon: Lin Landsman, MD;  Location: [facility] ENDOSCOPY;  Service: Gastroenterology;  Laterality: N/A;  . ESOPHAGOGASTRODUODENOSCOPY (EGD) WITH PROPOFOL N/A 09/20/2017   Procedure: ESOPHAGOGASTRODUODENOSCOPY (EGD) WITH PROPOFOL;  Surgeon: Lin Landsman, MD;  Location: [facility] ENDOSCOPY;  Service: Gastroenterology;  Laterality: N/A;  . ESOPHAGOGASTRODUODENOSCOPY (EGD) WITH PROPOFOL N/A 11/01/2017   Procedure:  ESOPHAGOGASTRODUODENOSCOPY (EGD) WITH PROPOFOL;  Surgeon: Lin Landsman, MD;  Location: [facility] ENDOSCOPY;  Service: Gastroenterology;  Laterality: N/A;  .  ESOPHAGOGASTRODUODENOSCOPY (EGD) WITH PROPOFOL N/A 01/17/2018   Procedure: ESOPHAGOGASTRODUODENOSCOPY (EGD) WITH PROPOFOL;  Surgeon: Lin Landsman, MD;  Location: [place];  Service: Gastroenterology;  Laterality: N/A;  . ESOPHAGOGASTRODUODENOSCOPY (EGD) WITH PROPOFOL N/A 06/20/2018   Procedure: ESOPHAGOGASTRODUODENOSCOPY (EGD) WITH PROPOFOL;  Surgeon: Lin Landsman, MD;  Location: [facility] ENDOSCOPY;  Service: Gastroenterology;  Laterality: N/A;  EGD/ with banding ligation   . IR RADIOLOGIST EVAL & MGMT  07/09/2018  . POLYPECTOMY  02/01/2015   Procedure: POLYPECTOMY;  Surgeon: Lucilla Lame, MD;  Location: [place];  Service: Endoscopy;;  . TONSILLECTOMY AND ADENOIDECTOMY      SOCIAL HISTORY: Social History   Socioeconomic History  . Marital status: Widowed    Spouse name: Not on file  . Number of children: 2  . Years of education: Not on file  . Highest education level: Some college, no degree  Occupational History  . Occupation: retired  Tobacco Use  . Smoking status: Former Smoker    Packs/day: 0.50    Years: 6.00    Pack years: 3.00    Types: Cigarettes    Quit date: 05/08/1987    Years since quitting: 33.2  . Smokeless tobacco: Never Used  Vaping Use  . Vaping Use: Never used  Substance and Sexual Activity  . Alcohol use: Never  . Drug use: Never  . Sexual activity: Not on file  Other Topics Concern  . Not on file  Social History Narrative  . Not on file   Social Determinants of Health   Financial Resource Strain: Low Risk   . Difficulty of Paying Living Expenses: Not hard at all  Food Insecurity: No Food Insecurity  . Worried About Charity fundraiser in the Last Year: Never true  . Ran Out of Food in the Last Year: Never true  Transportation Needs: No Transportation Needs  . Lack of  Transportation (Medical): No  . Lack of Transportation (Non-Medical): No  Physical Activity: Inactive  . Days of Exercise per Week: 0 days  . Minutes of Exercise per Session: 0 min  Stress: No Stress Concern Present  . Feeling of Stress : Not at all  Social Connections: Moderately Isolated  . Frequency of Communication with Friends and Family: More than three times a week  . Frequency of Social Gatherings with Friends and Family: More than three times a week  . Attends Religious Services: More than 4 times per year  . Active Member of Clubs or Organizations: No  . Attends Archivist Meetings: Never  . Marital Status: Widowed  Intimate Partner Violence: Not At Risk  . Fear of Current or Ex-Partner: No  . Emotionally Abused: No  . Physically Abused: No  . Sexually Abused: No    FAMILY HISTORY: Family History  Problem Relation Age of Onset  . Dementia Mother   . Alcohol abuse Father   . Cancer Sister        lung  . Lymphoma Sister   . Melanoma Sister   . Lymphoma Sister     ALLERGIES:  is allergic to amoxicillin, codeine, hydrocodone-acetaminophen, nitrofurantoin, nitrofurantoin monohyd macro, penicillins, and sulfa antibiotics.  MEDICATIONS:  Current Outpatient Medications  Medication Sig Dispense Refill  . Blood Glucose Monitoring Suppl (ACCU-CHEK AVIVA PLUS) w/Device KIT To check blood sugar daily 1 kit 0  . cholecalciferol (VITAMIN D) 25 MCG tablet Take 1 tablet (1,000 Units total) by mouth daily. 30 tablet 0  . furosemide (LASIX) 40 MG tablet Take 1 tablet (  40 mg total) by mouth daily. 90 tablet 1  . glucose blood (ACCU-CHEK AVIVA PLUS) test strip To check blood sugar daily 100 each 3  . lactulose (CHRONULAC) 10 GM/15ML solution Take 30 mLs (20 g total) by mouth 2 (two) times daily. 1892 mL 1  . Lancets (ACCU-CHEK SOFT TOUCH) lancets To check blood sugar daily 100 each 12  . levETIRAcetam (KEPPRA) 500 MG tablet Take 1 tablet (500 mg total) by mouth 2 (two)  times daily. 60 tablet 1  . liver oil-zinc oxide (DESITIN) 40 % ointment Apply topically as needed for irritation. 56.7 g 0  . Magnesium 200 MG TABS Take 1 tablet by mouth daily.    . nadolol (CORGARD) 20 MG tablet TAKE ONE-HALF TABLET BY MOUTH EVERY DAY (Patient taking differently: Take 10 mg by mouth daily.) 45 tablet 3  . pantoprazole (PROTONIX) 40 MG tablet TAKE ONE (1) TABLET BY MOUTH TWO TIMES PER DAY 180 tablet 0  . rifaximin (XIFAXAN) 550 MG TABS tablet Take 1 tablet (550 mg total) by mouth 2 (two) times daily. 60 tablet 1  . spironolactone (ALDACTONE) 50 MG tablet Take 1 tablet (50 mg total) by mouth daily. Take along with 168m 30 tablet 0   No current facility-administered medications for this visit.      .Marland Kitchen PHYSICAL EXAMINATION:   Vitals:   07/26/20 1044  BP: 103/72  Pulse: 72  Resp: 20  Temp: (!) 97.1 F (36.2 C)   Filed Weights   07/26/20 1044  Weight: 158 lb (71.7 kg)    Physical Exam Constitutional:      Comments: Alone.  Ambulating independently.  HENT:     Head: Normocephalic and atraumatic.     Mouth/Throat:     Pharynx: No oropharyngeal exudate.  Eyes:     Pupils: Pupils are equal, round, and reactive to light.  Cardiovascular:     Rate and Rhythm: Normal rate and regular rhythm.  Pulmonary:     Effort: Pulmonary effort is normal. No respiratory distress.     Breath sounds: Normal breath sounds. No wheezing.  Abdominal:     General: Bowel sounds are normal. There is no distension.     Palpations: Abdomen is soft. There is no mass.     Tenderness: There is no abdominal tenderness. There is no guarding or rebound.     Comments: Positive for splenomegaly.  No ascites noted   Musculoskeletal:        General: No tenderness. Normal range of motion.     Cervical back: Normal range of motion and neck supple.  Skin:    General: Skin is warm.  Neurological:     Mental Status: She is alert and oriented to person, place, and time.  Psychiatric:         Mood and Affect: Affect normal.      LABORATORY DATA:  I have reviewed the data as listed Lab Results  Component Value Date   WBC 2.6 (L) 07/26/2020   HGB 12.1 07/26/2020   HCT 36.1 07/26/2020   MCV 93.3 07/26/2020   PLT 67 (L) 07/26/2020   Recent Labs    12/05/19 0856 01/23/20 1002 04/23/20 1217 05/22/20 1935 05/23/20 0210 07/08/20 1524 07/15/20 0835 07/22/20 1146 07/26/20 1015  NA 129* 130*   < > 131*  130*   < > 130* 131* 129* 128*  K 4.4 4.3   < > 3.6   < > 4.2 4.2 4.8 4.0  CL 90* 93*   < >  95*   < > 93* 93* 94* 92*  CO2 26 29   < > 26   < > 28 30 26 27   GLUCOSE 121* 136*   < > 171*   < > 105* 121* 144* 205*  BUN 10 11   < > 7*   < > 11 10 10 13   CREATININE 0.87 0.92   < > 0.73   < > 0.89 1.06* 0.79 0.92  CALCIUM 9.1 9.2   < > 8.5*   < > 9.3 9.1 9.1 9.0  GFRNONAA 64 59*   < > >60   < > >60 53* >60 >60  GFRAA 73 >60  --   --   --   --   --   --   --   PROT 6.5 7.1   < > 5.4*   < > 6.8 6.4*  --  6.4*  ALBUMIN 3.8 3.8   < > 2.7*   < > 3.2* 3.2*  --  3.1*  AST 23 27   < > 38   < > 37 30  --  30  ALT 10 14   < > 15   < > 18 15  --  15  ALKPHOS 73 59   < > 62   < > 82 76  --  71  BILITOT 1.3* 1.4*   < > 1.1   < > 1.8* 1.9*  --  1.7*  BILIDIR  --   --   --  0.3*  --   --   --   --   --   IBILI  --   --   --  0.8  --   --   --   --   --    < > = values in this interval not displayed.    RADIOGRAPHIC STUDIES: I have personally reviewed the radiological images as listed and agreed with the findings in the report. No results found.  ASSESSMENT & PLAN:  Thrombocytopenia ([place]) # Thrombocytopenia secondary to hypersplenism from splenomegaly.  Platelets-53 ; trending down over the last 5 years, but overall - STABLE  If platelets less than 30/spontaneous bleeding-would consider thrombopoietin stimulating agents.  # mild anemia/ leucopenia- sec to cirrhosis-no infections; asymtomatic-STABLE.    # Cirrhosis-child pugh-C/recent decompensation s/p paracentesis; declined  TIPs; awaiting AFP.  US abdomen for [facility] surveillance- Jan 2022- ascites/ NO Liver lesions.   # Hyponatremia- Sodium 128- sec to Cirrhosis/STABLE. Defer to PCP/GI>   # DISPOSITION: #  follow up 6 months- MD/labs-CBC CMP PT PTT AFP-Dr.B     Cammie Sickle, MD 07/26/2020 12:36 PM

## 2020-07-27 LAB — AFP TUMOR MARKER: AFP, Serum, Tumor Marker: 3.6 ng/mL (ref 0.0–9.2)

## 2020-07-27 NOTE — Telephone Encounter (Signed)
Agree, since patient is not a liver transplant candidate, recommend referral to palliative care  Jennifer [name], please refer her to palliative care for decompensated cirrhosis  Thanks RV

## 2020-07-27 NOTE — Telephone Encounter (Signed)
Placed referral to palliative care

## 2020-07-27 NOTE — Addendum Note (Signed)
Addended by: Ulyess Blossom L on: 07/27/2020 02:13 PM   Modules accepted: Orders

## 2020-07-30 ENCOUNTER — Telehealth: Payer: Self-pay

## 2020-07-30 NOTE — Progress Notes (Signed)
    Chronic Care Management Pharmacy Assistant   Name: Jennifer [name]  MRN: [id number] DOB: Dec 24, 1940  Reason for Friend[place] Call.   Recent office visits:  06/01/2020 CCM Felecia McCray  06/09/2020 CCM Felecia McCray 06/15/2020 PCP [name]  Recent consult visits:  07/08/2020 Gastroenterology Rohini Vanga increase Lasix to 40 mg daily 07/26/2020 Oncology [facility] visits:  Medication Reconciliation was completed by comparing discharge summary, patient's EMR and Pharmacy list, and upon discussion with patient.  Admitted to the hospital on 06/02/2020 due to Confusion. Marland Kitchen Discharged from [place] [facility] emergency [place]?Medications Started at [facility] Discharge:?? -None  Medication Changes at Hospital Discharge: -None   Medications Discontinued at Hospital Discharge: -None   Medications that remain the same after Hospital Discharge:??  -All other medications will remain the same.    Medication Reconciliation was completed by comparing discharge summary, patient's EMR and Pharmacy list, and upon discussion with patient.  Admitted to the hospital on 06/03/2020 due to Seizure- like activity. Discharge date was 06/08/2020. Discharged from [facility] health at [place]?Medications Started at [facility] Discharge:?? -None   Medication Changes at Hospital Discharge: -None   Medications Discontinued at Hospital Discharge: -None   Medications that remain the same after Hospital Discharge:??  -All other medications will remain the same.  Medications: Outpatient Encounter Medications as of 07/30/2020  Medication Sig Note  . Blood Glucose Monitoring Suppl (ACCU-CHEK AVIVA PLUS) w/Device KIT To check blood sugar daily   . cholecalciferol (VITAMIN D) 25 MCG tablet Take 1 tablet (1,000 Units total) by mouth daily.   . furosemide (LASIX) 40 MG tablet Take 1 tablet (40 mg  total) by mouth daily.   Marland Kitchen glucose blood (ACCU-CHEK AVIVA PLUS) test strip To check blood sugar daily   . lactulose (CHRONULAC) 10 GM/15ML solution Take 30 mLs (20 g total) by mouth 2 (two) times daily.   . Lancets (ACCU-CHEK SOFT TOUCH) lancets To check blood sugar daily   . levETIRAcetam (KEPPRA) 500 MG tablet Take 1 tablet (500 mg total) by mouth 2 (two) times daily.   Marland Kitchen liver oil-zinc oxide (DESITIN) 40 % ointment Apply topically as needed for irritation.   . Magnesium 200 MG TABS Take 1 tablet by mouth daily.   . nadolol (CORGARD) 20 MG tablet TAKE ONE-HALF TABLET BY MOUTH EVERY DAY (Patient taking differently: Take 10 mg by mouth daily.) 05/26/2020: Patient is down to 10 mg once daily  . pantoprazole (PROTONIX) 40 MG tablet TAKE ONE (1) TABLET BY MOUTH TWO TIMES PER DAY   . rifaximin (XIFAXAN) 550 MG TABS tablet Take 1 tablet (550 mg total) by mouth 2 (two) times daily.   Marland Kitchen spironolactone (ALDACTONE) 50 MG tablet Take 1 tablet (50 mg total) by mouth daily. Take along with 153m    No facility-administered encounter medications on file as of 07/30/2020.    Received notification that she was transitioned to hospice care following her recent hospitalization on 06/03/20.Notifed Clinical Pharmacist.   Bessie KDeemstonPharmacist Assistant [phone]

## 2020-08-02 ENCOUNTER — Telehealth: Payer: Self-pay

## 2020-08-02 DIAGNOSIS — M7989 Other specified soft tissue disorders: Secondary | ICD-10-CM

## 2020-08-02 DIAGNOSIS — K7460 Unspecified cirrhosis of liver: Secondary | ICD-10-CM

## 2020-08-02 NOTE — Telephone Encounter (Signed)
Patient lvm inquiring if she needs to have a repeat Sodium check this week.  Also she would like to know if she can resume her Milk thistle, and Turmeric.  Please advise,  Thanks,  Sharyn Lull, [place]

## 2020-08-04 ENCOUNTER — Other Ambulatory Visit: Payer: Self-pay | Admitting: Gastroenterology

## 2020-08-04 ENCOUNTER — Other Ambulatory Visit: Payer: Self-pay | Admitting: Physician Assistant

## 2020-08-04 NOTE — Addendum Note (Signed)
Addended by: Ulyess Blossom L on: 08/04/2020 08:25 AM   Modules accepted: Orders

## 2020-08-04 NOTE — Telephone Encounter (Signed)
Recommend to decrease spironolactone to 100 mg daily Continue Lasix 40 mg daily  Only need 174m

## 2020-08-04 NOTE — Telephone Encounter (Signed)
Requested medication (s) are due for refill today:   Provider to review  Requested medication (s) are on the active medication list:   Yes  Future visit scheduled:   No   Last ordered: 06/08/2020 #60, 1 refill  Non delegated refill   Requested Prescriptions  Pending Prescriptions Disp Refills   levETIRAcetam (KEPPRA) 500 MG tablet [Pharmacy Med Name: LEVETIRACETAM 500 MG TAB] 60 tablet 1    Sig: TAKE ONE TABLET BY MOUTH TWICE DAILY      Not Delegated - Neurology:  Anticonvulsants Failed - 08/04/2020  8:33 AM      Failed - This refill cannot be delegated      Failed - PLT in normal range and within 360 days    Platelets  Date Value Ref Range Status  07/26/2020 67 (L) 150 - 400 K/uL Final  12/05/2019 52 (LL) 150 - 450 x10E3/uL Final    Comment:    Actual platelet count may be somewhat higher than reported due to aggregation of platelets in this sample.           Failed - WBC in normal range and within 360 days    WBC  Date Value Ref Range Status  07/26/2020 2.6 (L) 4.0 - 10.5 K/uL Final          Passed - HCT in normal range and within 360 days    HCT  Date Value Ref Range Status  07/26/2020 36.1 36.0 - 46.0 % Final   Hematocrit  Date Value Ref Range Status  12/05/2019 33.6 (L) 34.0 - 46.6 % Final          Passed - HGB in normal range and within 360 days    Hemoglobin  Date Value Ref Range Status  07/26/2020 12.1 12.0 - 15.0 g/dL Final  12/05/2019 11.5 11.1 - 15.9 g/dL Final          Passed - Valid encounter within last 12 months    Recent Outpatient Visits           1 month ago Liver cirrhosis secondary to NASH [facility])   [place], [place]   2 months ago Hyponatremia   [place], [place]   3 months ago Acute upper abdominal pain   [place], Vickki Muff, PA-C   8 months ago Annual physical exam   [place], Clearnce Sorrel, [place]   1 year ago  Annual physical exam   [place], Clearnce Sorrel, [place]       Future Appointments             In 2 months Vanga, Tally Due, MD [place]

## 2020-08-04 NOTE — Telephone Encounter (Signed)
Goahead and order BMP, and it's ok to take turmeric but not milk thistle  RV

## 2020-08-04 NOTE — Telephone Encounter (Signed)
Patient verbalized understanding of instructions  

## 2020-08-06 ENCOUNTER — Other Ambulatory Visit: Payer: Self-pay | Admitting: Gastroenterology

## 2020-08-08 ENCOUNTER — Other Ambulatory Visit: Payer: Self-pay | Admitting: Physician Assistant

## 2020-08-09 ENCOUNTER — Other Ambulatory Visit
Admission: RE | Admit: 2020-08-09 | Discharge: 2020-08-09 | Disposition: A | Discharge status: Home / Self Care | Attending: Gastroenterology | Admitting: Gastroenterology

## 2020-08-09 DIAGNOSIS — K7581 Nonalcoholic steatohepatitis (NASH): Secondary | ICD-10-CM | POA: Insufficient documentation

## 2020-08-09 DIAGNOSIS — M7989 Other specified soft tissue disorders: Secondary | ICD-10-CM | POA: Diagnosis not present

## 2020-08-09 DIAGNOSIS — K7460 Unspecified cirrhosis of liver: Secondary | ICD-10-CM | POA: Diagnosis not present

## 2020-08-09 LAB — BASIC METABOLIC PANEL
Anion gap: 7 (ref 5–15)
BUN: 14 mg/dL (ref 8–23)
CO2: 28 mmol/L (ref 22–32)
Calcium: 8.6 mg/dL — ABNORMAL LOW (ref 8.9–10.3)
Chloride: 96 mmol/L — ABNORMAL LOW (ref 98–111)
Creatinine, Ser: 0.81 mg/dL (ref 0.44–1.00)
GFR, Estimated: >60 mL/min (ref >60)
Glucose, Bld: 123 mg/dL — ABNORMAL HIGH (ref 70–99)
Potassium: 3.8 mmol/L (ref 3.5–5.1)
Sodium: 131 mmol/L — ABNORMAL LOW (ref 135–145)

## 2020-08-11 ENCOUNTER — Other Ambulatory Visit: Payer: Self-pay | Admitting: Physician Assistant

## 2020-08-11 ENCOUNTER — Telehealth: Payer: Self-pay

## 2020-08-11 NOTE — Telephone Encounter (Signed)
Patient LVM to call her back

## 2020-08-11 NOTE — Telephone Encounter (Signed)
-----   Message from Lin Landsman, MD sent at 08/11/2020 11:42 AM EDT ----- Her sodium levels are stable at 131  RV

## 2020-08-11 NOTE — Telephone Encounter (Signed)
Called and left a message for call back  

## 2020-08-11 NOTE — Telephone Encounter (Signed)
Patient verbalized understanding  

## 2020-09-01 ENCOUNTER — Telehealth: Payer: Self-pay | Admitting: Physician Assistant

## 2020-09-01 NOTE — Telephone Encounter (Signed)
I can

## 2020-09-01 NOTE — Telephone Encounter (Signed)
Please review chart and advise. KW

## 2020-09-01 NOTE — Telephone Encounter (Addendum)
Deandra rn with [facility] is calling and would like to know who will be managing pt care and also hospice attending since jennifer has left the practice

## 2020-09-02 NOTE — Telephone Encounter (Signed)
Jennifer [name] is aware dr b said she can to the below message

## 2020-09-03 ENCOUNTER — Other Ambulatory Visit: Payer: Self-pay | Admitting: Physician Assistant

## 2020-09-06 NOTE — Telephone Encounter (Signed)
Requested medication (s) are due for refill today: yes  Requested medication (s) are on the active medication list: yes   Last refill: 07/25/2020  Future visit scheduled:  yes   Notes to clinic: this refill cannot be delegated    Requested Prescriptions  Pending Prescriptions Disp Refills   levETIRAcetam (KEPPRA) 500 MG tablet [Pharmacy Med Name: LEVETIRACETAM 500 MG TAB] 60 tablet 1    Sig: TAKE ONE TABLET BY MOUTH TWICE DAILY      Not Delegated - Neurology:  Anticonvulsants Failed - 09/03/2020 12:23 PM      Failed - This refill cannot be delegated      Failed - PLT in normal range and within 360 days    Platelets  Date Value Ref Range Status  07/26/2020 67 (L) 150 - 400 K/uL Final  12/05/2019 52 (LL) 150 - 450 x10E3/uL Final    Comment:    Actual platelet count may be somewhat higher than reported due to aggregation of platelets in this sample.           Failed - WBC in normal range and within 360 days    WBC  Date Value Ref Range Status  07/26/2020 2.6 (L) 4.0 - 10.5 K/uL Final          Passed - HCT in normal range and within 360 days    HCT  Date Value Ref Range Status  07/26/2020 36.1 36.0 - 46.0 % Final   Hematocrit  Date Value Ref Range Status  12/05/2019 33.6 (L) 34.0 - 46.6 % Final          Passed - HGB in normal range and within 360 days    Hemoglobin  Date Value Ref Range Status  07/26/2020 12.1 12.0 - 15.0 g/dL Final  12/05/2019 11.5 11.1 - 15.9 g/dL Final          Passed - Valid encounter within last 12 months    Recent Outpatient Visits           2 months ago Liver cirrhosis secondary to NASH [facility])   [place], [place]   3 months ago Hyponatremia   [place], [place]   4 months ago Acute upper abdominal pain   [place], Vickki Muff, PA-C   9 months ago Annual physical exam   [place], Clearnce Sorrel, [place]   1 year ago  Annual physical exam   [place], Clearnce Sorrel, [place]       Future Appointments             In 1 month Vanga, Tally Due, MD [place]

## 2020-09-20 ENCOUNTER — Other Ambulatory Visit: Payer: Self-pay | Admitting: Family Medicine

## 2020-09-20 DIAGNOSIS — K219 Gastro-esophageal reflux disease without esophagitis: Secondary | ICD-10-CM

## 2020-09-23 ENCOUNTER — Telehealth: Payer: Self-pay

## 2020-09-23 NOTE — Telephone Encounter (Signed)
Copied from [place] [id number]. Topic: General - Other >> Sep 23, 2020 11:01 AM Tessa Lerner A wrote: Reason for CRM: Patient would like to be contacted to further discuss their medications   Patient shares that they were taken off omeprazole (PRILOSEC) 20 MG capsule and prescribed pantoprazole (PROTONIX) 40 MG tablet  The patient would like to discuss the discontinuation further and has additional questions related to both medications   Please contact to further advise when possible

## 2020-09-23 NOTE — Telephone Encounter (Signed)
Spoke with patient on the phone to clarify, she states that she wanted to know why you switched from omeprazole to pantoprazole. I reviewed over patients past medication list and active medication list and advised her from what I saw Omeprazole was d/c in 07/2017 and she was started on pantoprazole and according to past office notes she reported good compliance on medication and symptom control. Patient stated that she remembered that she was switched after her hospital stay "back then", patient states that hospice is coming to her home and advised her they could supply her with 15 days worth of prescription, I advised her you sent in new prescription on 09/20/20. Patient wanted to thank you for refilling medication and states that she will be in touch with her pharmacy for future refills. KW

## 2020-09-23 NOTE — Telephone Encounter (Signed)
Noted  

## 2020-09-24 ENCOUNTER — Other Ambulatory Visit: Payer: Self-pay | Admitting: Physician Assistant

## 2020-09-24 DIAGNOSIS — J45902 Unspecified asthma with status asthmaticus: Secondary | ICD-10-CM

## 2020-09-27 ENCOUNTER — Other Ambulatory Visit: Payer: Self-pay | Admitting: Family Medicine

## 2020-09-27 NOTE — Telephone Encounter (Signed)
Requested medication (s) are due for refill today: Yes  Requested medication (s) are on the active medication list: Yes  Last refill:  08/04/20  Future visit scheduled: No  Notes to clinic:  See request.    Requested Prescriptions  Pending Prescriptions Disp Refills   levETIRAcetam (KEPPRA) 500 MG tablet [Pharmacy Med Name: LEVETIRACETAM 500 MG TAB] 60 tablet 1    Sig: TAKE ONE TABLET BY MOUTH TWICE DAILY      Not Delegated - Neurology:  Anticonvulsants Failed - 09/27/2020 11:35 AM      Failed - This refill cannot be delegated      Failed - PLT in normal range and within 360 days    Platelets  Date Value Ref Range Status  07/26/2020 67 (L) 150 - 400 K/uL Final  12/05/2019 52 (LL) 150 - 450 x10E3/uL Final    Comment:    Actual platelet count may be somewhat higher than reported due to aggregation of platelets in this sample.           Failed - WBC in normal range and within 360 days    WBC  Date Value Ref Range Status  07/26/2020 2.6 (L) 4.0 - 10.5 K/uL Final          Passed - HCT in normal range and within 360 days    HCT  Date Value Ref Range Status  07/26/2020 36.1 36.0 - 46.0 % Final   Hematocrit  Date Value Ref Range Status  12/05/2019 33.6 (L) 34.0 - 46.6 % Final          Passed - HGB in normal range and within 360 days    Hemoglobin  Date Value Ref Range Status  07/26/2020 12.1 12.0 - 15.0 g/dL Final  12/05/2019 11.5 11.1 - 15.9 g/dL Final          Passed - Valid encounter within last 12 months    Recent Outpatient Visits           3 months ago Liver cirrhosis secondary to NASH [facility])   [place], [place]   4 months ago Hyponatremia   [place], [place]   5 months ago Acute upper abdominal pain   [place], Vickki Muff, PA-C   9 months ago Annual physical exam   [place], Clearnce Sorrel, [place]   1 year ago Annual physical exam    [place], Clearnce Sorrel, [place]       Future Appointments             In 2 weeks Vanga, Tally Due, MD [place]

## 2020-09-28 NOTE — Telephone Encounter (Signed)
Requested medication (s) are due for refill today: yes  Requested medication (s) are on the active medication list: no  Last refill:  05/15/20  Future visit scheduled: no  Notes to clinic:  last filled by [name], not on active med list    Requested Prescriptions  Pending Prescriptions Disp Refills   albuterol (VENTOLIN HFA) 108 (90 Base) MCG/ACT inhaler [Pharmacy Med Name: ALBUTEROL SULFATE HFA 108 (90 BASE)] 18 g 1    Sig: INHALE 1-2 PUFFS INTO THE LUNGS EVERY 6 HOURS AS NEEDED FOR WHEEZING OR SHORTNESS OF BREATH      Pulmonology:  Beta Agonists Failed - 09/24/2020 10:05 AM      Failed - One inhaler should last at least one month. If the patient is requesting refills earlier, contact the patient to check for uncontrolled symptoms.      Passed - Valid encounter within last 12 months    Recent Outpatient Visits           3 months ago Liver cirrhosis secondary to NASH [facility])   [place], [place]   4 months ago Hyponatremia   [place], [place]   5 months ago Acute upper abdominal pain   [facility] [place], Vickki Muff, PA-C   9 months ago Annual physical exam   [facility] [place], Clearnce Sorrel, [place]   1 year ago Annual physical exam   [place], Clearnce Sorrel, [place]       Future Appointments             In 1 week Vanga, Tally Due, MD [place]

## 2020-09-28 NOTE — Telephone Encounter (Signed)
Needs to see Neuro for additional refills - referral was previously placed.

## 2020-10-04 ENCOUNTER — Other Ambulatory Visit: Payer: Self-pay | Admitting: Gastroenterology

## 2020-10-04 ENCOUNTER — Other Ambulatory Visit: Payer: Self-pay | Admitting: Physician Assistant

## 2020-10-04 DIAGNOSIS — Z8719 Personal history of other diseases of the digestive system: Secondary | ICD-10-CM

## 2020-10-04 DIAGNOSIS — K7460 Unspecified cirrhosis of liver: Secondary | ICD-10-CM

## 2020-10-11 ENCOUNTER — Other Ambulatory Visit: Payer: Self-pay

## 2020-10-11 ENCOUNTER — Encounter: Payer: Self-pay | Admitting: Gastroenterology

## 2020-10-11 ENCOUNTER — Telehealth: Payer: Self-pay

## 2020-10-11 ENCOUNTER — Ambulatory Visit (INDEPENDENT_AMBULATORY_CARE_PROVIDER_SITE_OTHER): Payer: Medicare HMO | Admitting: Gastroenterology

## 2020-10-11 VITALS — BP 110/66 | HR 69 | Temp 97.8°F | Ht 61.0 in | Wt 165.2 lb

## 2020-10-11 DIAGNOSIS — R188 Other ascites: Secondary | ICD-10-CM

## 2020-10-11 DIAGNOSIS — K7460 Unspecified cirrhosis of liver: Secondary | ICD-10-CM | POA: Diagnosis not present

## 2020-10-11 DIAGNOSIS — K7581 Nonalcoholic steatohepatitis (NASH): Secondary | ICD-10-CM

## 2020-10-11 DIAGNOSIS — R2243 Localized swelling, mass and lump, lower limb, bilateral: Secondary | ICD-10-CM

## 2020-10-11 DIAGNOSIS — K7290 Hepatic failure, unspecified without coma: Secondary | ICD-10-CM | POA: Diagnosis not present

## 2020-10-11 MED ORDER — ALBUMIN HUMAN 25 % IV SOLN: INTRAVENOUS | 0 refills | Status: DC

## 2020-10-11 NOTE — Telephone Encounter (Signed)
Talked to [place] with special procedures to schedule for Paracentesis. Patient is scheduled for Wednesday 10/13/2020 arrive to medical mall at 2:00pm for a 2:30pm scan. Tried to call patient left a message for call back

## 2020-10-11 NOTE — Patient Instructions (Addendum)
We will call you when your paracentesis is scheduled

## 2020-10-11 NOTE — Progress Notes (Signed)
Cephas Darby, MD [address]  [place]  [place], [place] [address]  Main: [phone]  Fax: [phone]    Gastroenterology Consultation  Referring Provider:     Florian Buff* Primary Care Physician:  Mar Daring, PA-C Primary Gastroenterologist:  Dr. Cephas Darby Reason for Consultation:   Decompensated NASH Cirrhosis        HPI:   Jennifer [name] is a 80 y.o. female referred by Dr. Marlyn Corporal, Cle[place] Sorrel, PA-C  for consultation & management of decompensated cirrhosis. Patient has history of metabolic syndrome. Patient was recently admitted to the hospital about 2 weeks ago with variceal bleed,and massive ascites, underwent EGD and ligation of esophageal varices. She had therapeutic paracentesis, fluid analysis consistent with portal hypertension and there as no evidence of SBP. She was temporarily intubated in the setting of massive upper GI bleed. She was discharged home on spironolactone, Lasix, nadolol and pantoprazole. She is known to have cirrhosis since 2013. She had CBC, CMP after discharge which are stable. She is here for hospital follow-up today. She continues to have abdominal distention, reports that swelling of legs has significantly improved, otherwise feeling well. She reports having loose nonbloody stools which is chronic. She denies hematemesis, melena, rectal bleeding, abdominal pain, nausea, vomiting. She recently had an ultrasound which revealed only trace ascites. Hep B surface antigen is negative, HCV antibody negative in 2017, ferritin 22 in 07/2016. She does not have a regular gi f/u for cirrhosis, and not on diuretics. She denies drinking ETOH. She reports that she stopped taking metformin and has been checking her blood glucose levels at home 4 times daily and numbers have been below 120  Follow-up visit 09/11/2017 Patient lost about 25 pounds since last visit after aggressive diuresis. I increased her Lasix to 40 mg and spironolactone  100 mg. Patient also has pain restricting her sodium intake in her diet. She reports feeling well. Her abdominal distention and swelling of legs have completely resolved. She reports feeling slightly dizzy when she gets up in the morning. She is on nadolol for secondary prophylaxis. She denies any complaints today. She reports that her energy levels are better. She is taking oral iron. She denies black stools or rectal bleeding, hematemesis. Her most recent labs about 2 weeks ago revealed normal hemoglobin, mild thrombocytopenia. Her LFTs are almost normal, hypoalbuminemia resolved. She has preserved kidney function and electrolytes are normal  Follow-up visit 11/16/2017 She reports feeling well. She denies abdominal distention or swelling of legs. She continues to take Lasix 40 mg and spironolactone 100 mg daily. She is taking Protonix daily, and nadolol. Her last A1c was 6. She's not taking oral iron any more. She is no longer anemic. I performed her last EGD about 2 weeks ago, showed large esophageal varices with no red well signs. There was significant scarring from prior banding, therefore I did not perform ligation.  Follow-up visit 03/27/2018 Patient had right pelvic fracture after mechanical fall in 01/2018.  He did not undergo surgery per orthopedics.  She was undergoing PT at skilled nursing facility.  Patient reported that her diuretics were held at rehab, resulted in severe swelling of legs as well as abdominal distention, gained about 15 pounds.  She was restarted back on diuretics and her swelling resolved, she lost about 10 pounds on diuretics.  Currently, she is taking Lasix 20 mg daily and spironolactone 100 mg daily.  She reports doing well.  Taking oral iron 1 pill daily.  Denies hematochezia, melena, hematemesis or abdominal pain.  Follow-up visit 07/08/2020 Patient was admitted to [facility] in January secondary to severe hyponatremia resulting in altered mental status, loss of consciousness.   This has resolved.  She then got readmitted secondary to seizure-like episode and she started on Keppra.  She has appointment to see neurology in April.  Patient reports that she has been doing very well since discharge.  She does report mild abdominal distention, denies any swelling of legs.  She is taking Lasix 20 mg daily and spironolactone 100 mg daily.  She denies any black stools, abdominal pain, rectal bleeding, nausea, vomiting.  Her weight has been stable.  She is trying to maintain low-sodium diet.  She is not taking rifaximin or lactulose.  She reports having 3-4 bowel movements daily.  Patient is also evaluated by IR for TIPS placement.  Patient reports that she chose not to undergo the procedure because she had a dream that said not to undergo procedure and she decided not to proceed with it.  Follow-up visit 10/11/2020 Patient is here for follow-up of decompensated cirrhosis of liver.  She has gained about 7 pounds since last visit.  She reports worsening of abdominal distention.  She needs a paracentesis.  She also reports bilateral swelling of legs.  She remains compliant with Lasix 40 mg and spironolactone 100 mg daily.  Seeing dietitian, patient was informed not to have salmon and she admitted.  She reports that she is trying to be compliant with low-sodium diet.  Patient is currently under hospice care which she feels is giving her tremendous support.  She denies any black stools, rectal bleeding, nausea or vomiting, abdominal pain, fever, chills  NSAIDs: none  Antiplts/Anticoagulants/Anti thrombotics: none  GI Procedures:   EGD 01/17/18 - Normal duodenal bulb and second portion of the duodenum. - Portal hypertensive gastropathy. - A few gastric polyps.resected, Clip (MR conditional) was placed. - Large (> 5 mm) esophageal varices, banding was not performed due to device malfunction.  DIAGNOSIS:  A. STOMACH POLYPS X2, BODY; HOT SNARE:  - HYPERPLASTIC TYPE GASTRIC POLYP.  -  NEGATIVE FOR H. PYLORI, DYSPLASIA, AND MALIGNANCY.   For EGD 11/01/2017 - Normal duodenal bulb and second portion of the duodenum. - Portal hypertensive gastropathy. - A few gastric polyps. - Large (> 5 mm) esophageal varices. - No specimens collected.  EGD 09/20/2017 - Normal duodenal bulb and second portion of the duodenum. - Portal hypertensive gastropathy. - Recently bleeding large (> 5 mm) esophageal varices. Incompletely eradicated. Bandedx3. - No specimens collected.  EGD 08/16/2017 - Normal duodenal bulb and second portion of the duodenum. - Portal hypertensive gastropathy. - Recently bleeding large (> 5 mm) esophageal varices. Incompletely eradicated. Banded x 4. - No specimens collected.  EGD 07/12/2017 - Normal duodenal bulb and second portion of the duodenum. - Portal hypertensive gastropathy. - Bleeding large (> 5 mm) esophageal varices. Incompletely eradicated. Banded. - No specimens collected.  Colonoscopy 04/03/2005 normal  Colonoscopy 02/01/2015 - One 8 mm polyp in the ascending colon. Resected and retrieved. Clips were placed. - One 4 mm polyp in the transverse colon. Resected and retrieved. - One 7 mm polyp in the sigmoid colon. Resected and retrieved. Clip was placed. - Non-bleeding internal hemorrhoids. 1. Colon, polyp(s), ascending colon polyp - SESSILE SERRATED POLYP. - NO DYSPLASIA OR MALIGNANCY. 2. Colon, polyp(s), transverse colon polyp - TUBULAR ADENOMA. - NO HIGH GRADE DYSPLASIA OR MALIGNANCY. 3. Colon, polyp(s), sigmoid colon polyp - TUBULAR ADENOMA. - NO HIGH  GRADE DYSPLASIA OR MALIGNANCY.  Colonoscopy 11/22/2017 - One 5 mm polyp in the proximal ascending colon, removed with a cold biopsy forceps. Resected and retrieved. - Rectal varices. - Non-bleeding internal hemorrhoids. DIAGNOSIS:  A. COLON POLYP X 1, ASCENDING; COLD BIOPSY:  - TUBULAR ADENOMA.  - NEGATIVE FOR HIGH-GRADE DYSPLASIA AND MALIGNANCY.   Past Medical History:   Diagnosis Date  . Allergy   . Arthritis   . Asthma   . Colon polyp   . Fatty liver   . GERD (gastroesophageal reflux disease)   . Hyperlipidemia   . Hypertension   . Motion sickness    boats    Past Surgical History:  Procedure Laterality Date  . ABDOMINAL HYSTERECTOMY  1978  . COLONOSCOPY WITH PROPOFOL N/A 02/01/2015   Procedure: COLONOSCOPY WITH PROPOFOL;  Surgeon: Lucilla Lame, MD;  Location: [facility];  Service: Endoscopy;  Laterality: N/A;  Diabetic - oral meds  . COLONOSCOPY WITH PROPOFOL N/A 11/22/2017   Procedure: COLONOSCOPY WITH PROPOFOL;  Surgeon: Lin Landsman, MD;  Location: [facility] ENDOSCOPY;  Service: Gastroenterology;  Laterality: N/A;  . ESOPHAGOGASTRODUODENOSCOPY (EGD) WITH PROPOFOL N/A 07/12/2017   Procedure: ESOPHAGOGASTRODUODENOSCOPY (EGD) WITH PROPOFOL;  Surgeon: Lin Landsman, MD;  Location: [facility] ENDOSCOPY;  Service: Gastroenterology;  Laterality: N/A;  . ESOPHAGOGASTRODUODENOSCOPY (EGD) WITH PROPOFOL N/A 08/16/2017   Procedure: ESOPHAGOGASTRODUODENOSCOPY (EGD) WITH PROPOFOL;  Surgeon: Lin Landsman, MD;  Location: [facility] ENDOSCOPY;  Service: Gastroenterology;  Laterality: N/A;  . ESOPHAGOGASTRODUODENOSCOPY (EGD) WITH PROPOFOL N/A 09/20/2017   Procedure: ESOPHAGOGASTRODUODENOSCOPY (EGD) WITH PROPOFOL;  Surgeon: Lin Landsman, MD;  Location: [facility] ENDOSCOPY;  Service: Gastroenterology;  Laterality: N/A;  . ESOPHAGOGASTRODUODENOSCOPY (EGD) WITH PROPOFOL N/A 11/01/2017   Procedure: ESOPHAGOGASTRODUODENOSCOPY (EGD) WITH PROPOFOL;  Surgeon: Lin Landsman, MD;  Location: [facility] ENDOSCOPY;  Service: Gastroenterology;  Laterality: N/A;  . ESOPHAGOGASTRODUODENOSCOPY (EGD) WITH PROPOFOL N/A 01/17/2018   Procedure: ESOPHAGOGASTRODUODENOSCOPY (EGD) WITH PROPOFOL;  Surgeon: Lin Landsman, MD;  Location: [facility] ENDOSCOPY;  Service: Gastroenterology;  Laterality: N/A;  . ESOPHAGOGASTRODUODENOSCOPY (EGD) WITH PROPOFOL N/A 06/20/2018   Procedure:  ESOPHAGOGASTRODUODENOSCOPY (EGD) WITH PROPOFOL;  Surgeon: Lin Landsman, MD;  Location: [facility] ENDOSCOPY;  Service: Gastroenterology;  Laterality: N/A;  EGD/ with banding ligation   . IR RADIOLOGIST EVAL & MGMT  07/09/2018  . POLYPECTOMY  02/01/2015   Procedure: POLYPECTOMY;  Surgeon: Lucilla Lame, MD;  Location: [place];  Service: Endoscopy;;  . TONSILLECTOMY AND ADENOIDECTOMY       Current Outpatient Medications:  .  albumin human 25 % bottle, Inject 25 grams no matter what if 5L or more is removed another 25 grams, Disp: 50 mL, Rfl: 0 .  albuterol (VENTOLIN HFA) 108 (90 Base) MCG/ACT inhaler, INHALE 1-2 PUFFS INTO THE LUNGS EVERY 6 HOURS AS NEEDED FOR WHEEZING OR SHORTNESS OF BREATH, Disp: 18 g, Rfl: 1 .  Blood Glucose Monitoring Suppl (ACCU-CHEK AVIVA PLUS) w/Device KIT, To check blood sugar daily, Disp: 1 kit, Rfl: 0 .  cholecalciferol (VITAMIN D) 25 MCG tablet, Take 1 tablet (1,000 Units total) by mouth daily., Disp: 30 tablet, Rfl: 0 .  furosemide (LASIX) 40 MG tablet, TAKE 1 TABLET BY MOUTH DAILY, Disp: 90 tablet, Rfl: 0 .  glucose blood (ACCU-CHEK AVIVA PLUS) test strip, To check blood sugar daily, Disp: 100 each, Rfl: 3 .  Lancets (ACCU-CHEK SOFT TOUCH) lancets, To check blood sugar daily, Disp: 100 each, Rfl: 12 .  levETIRAcetam (KEPPRA) 500 MG tablet, TAKE ONE TABLET BY MOUTH TWICE DAILY, Disp: 60 tablet,  Rfl: 0 .  liver oil-zinc oxide (DESITIN) 40 % ointment, Apply topically as needed for irritation., Disp: 56.7 g, Rfl: 0 .  Magnesium 200 MG TABS, Take 1 tablet by mouth daily., Disp: , Rfl:  .  nadolol (CORGARD) 20 MG tablet, TAKE ONE-HALF TABLET BY MOUTH EVERY DAY (Patient taking differently: Take 10 mg by mouth daily.), Disp: 45 tablet, Rfl: 3 .  pantoprazole (PROTONIX) 40 MG tablet, TAKE ONE (1) TABLET BY MOUTH TWO TIMES PER DAY, Disp: 180 tablet, Rfl: 0 .  rifaximin (XIFAXAN) 550 MG TABS tablet, Take 1 tablet (550 mg total) by mouth 2 (two) times daily., Disp: 60  tablet, Rfl: 1 .  spironolactone (ALDACTONE) 100 MG tablet, TAKE ONE TABLET BY MOUTH EVERY DAY, Disp: 90 tablet, Rfl: 0 .  spironolactone (ALDACTONE) 50 MG tablet, Take 1 tablet (50 mg total) by mouth daily. Take along with 119m, Disp: 30 tablet, Rfl: 0 .  lactulose (CHRONULAC) 10 GM/15ML solution, Take 30 mLs (20 g total) by mouth 2 (two) times daily. (Patient not taking: Reported on 10/11/2020), Disp: 1892 mL, Rfl: 1   Family History  Problem Relation Age of Onset  . Dementia Mother   . Alcohol abuse Father   . Cancer Sister        lung  . Lymphoma Sister   . Melanoma Sister   . Lymphoma Sister      Social History   Tobacco Use  . Smoking status: Former Smoker    Packs/day: 0.50    Years: 6.00    Pack years: 3.00    Types: Cigarettes    Quit date: 05/08/1987    Years since quitting: 33.4  . Smokeless tobacco: Never Used  Vaping Use  . Vaping Use: Never used  Substance Use Topics  . Alcohol use: Never  . Drug use: Never    Allergies as of 10/11/2020 - Review Complete 10/11/2020  Allergen Reaction Noted  . Amoxicillin Other (See Comments) 07/17/2018  . Codeine Other (See Comments) 10/14/2014  . Hydrocodone-acetaminophen Other (See Comments) 10/14/2014  . Nitrofurantoin Other (See Comments) 11/05/2014  . Nitrofurantoin monohyd macro  10/14/2014  . Penicillins  10/14/2014  . Sulfa antibiotics Other (See Comments) 10/14/2014    Review of Systems:    All systems reviewed and negative except where noted in HPI.   Physical Exam:  BP 110/66 (BP Location: Left Arm, Patient Position: Sitting, Cuff Size: Normal)   Pulse 69   Temp 97.8 F (36.6 C) (Oral)   Ht _0  (1.549 m)   Wt 165 lb 4 oz (75 kg)   BMI 31.22 kg/m  No LMP recorded. Patient has had a hysterectomy.  General:   Alert,  Well-developed, well-nourished, pleasant and cooperative in NAD Head:  Normocephalic and atraumatic. Eyes:  Sclera clear, no icterus.   Conjunctiva pink. Ears:  Normal auditory  acuity. Nose:  No deformity, discharge, or lesions. Mouth:  No deformity or lesions,oropharynx pink & moist. Neck:  Supple; no masses or thyromegaly. Lungs:  Respirations even and unlabored.  Clear throughout to auscultation.   No wheezes, crackles, or rhonchi. No acute distress. Heart:  Regular rate and rhythm; no murmurs, clicks, rubs, or gallops. Abdomen:  Normal bowel sounds. Soft, non-tender and diffusely distended, dull in bilateral flanks, tympanic in the central abdomen, no hepatosplenomegaly.  No guarding or rebound tenderness.   Rectal: Not performed Msk:  Symmetrical without gross deformities. Good, equal movement & strength bilaterally. Pulses:  Normal pulses noted. Extremities:  No clubbing, 3+  edema.  No cyanosis. Neurologic:  Alert and oriented x3;  grossly normal neurologically. Skin:  Intact without significant lesions or rashes. No jaundice. Psych:  Alert and cooperative. Normal mood and affect.  Imaging Studies: reviewed  Assessment and Plan:   MORENIKE CUFF is a 81 y.o. Caucasian female with metabolic syndrome, diabetes, hypertension, hyperlipidemia seen for follow-up of decompensated cirrhosis with ascites and variceal bleed.  Decompensated cirrhosis: probably secondary to NASH in the setting of metabolic syndrome Hepatitis B and C negative, received hepatitis B vaccine in 2013. Normal ferritin levels Portal hypertension: manifested as ascites, thrombocytopenia, esophageal varices Volume overload: Patient is requiring periodic therapeutic paracentesis due to recurrence of ascites, Continue spironolactone 100 mg, and Lasix to 40 mg daily, continue low-sodium diet.  She refused TIPS 2D echo revealed grade 1 diastolic dysfunction Recommend therapeutic paracentesis with administration of 50 g of albumin Esophageal varices: status post banding on 07/12/2017. EGD in 08/2017, ligation performed.  EGD in 10/2017, ligation was not performed due to extensive scarring from prior  banding. Continue nadolol, patient is currently taking 10 mg daily, Attempted variceal banding in 01/2018, unsuccessful due to device malfunction. Repeat EGD in 06/2018, banding was not performed due to extensive scarring.  She was referred to IR for TIPS placement and she was deemed to be a candidate for it however, patient decided not to proceed with it and she is persistent about this decision.  Anemia/thrombocytopenia/coagulopathy: Recurrence of anemia,does not have B12 or iron deficiency, mild thrombocytopenia and stable, recheck labs today PSE: None HCC surveillance: No evidence of liver lesions based on imaging, up-to-date HRS: None She is immune to hepatitis A and B, received vaccinations in 2013 Received Pneumovax and prevnar  History of tubular adenomas:  Repeat surveillance colonoscopy 11/2022   Follow up in 3 months   Cephas Darby, MD

## 2020-10-11 NOTE — Telephone Encounter (Signed)
Informed patient of the instructions and she verbalized understanding

## 2020-10-13 ENCOUNTER — Ambulatory Visit
Admission: RE | Admit: 2020-10-13 | Discharge: 2020-10-13 | Disposition: A | Discharge status: Home / Self Care | Source: Ambulatory Visit | Attending: Gastroenterology | Admitting: Gastroenterology

## 2020-10-13 ENCOUNTER — Other Ambulatory Visit: Payer: Self-pay

## 2020-10-13 DIAGNOSIS — R188 Other ascites: Secondary | ICD-10-CM | POA: Diagnosis not present

## 2020-10-13 DIAGNOSIS — K7581 Nonalcoholic steatohepatitis (NASH): Secondary | ICD-10-CM | POA: Insufficient documentation

## 2020-10-13 DIAGNOSIS — K7460 Unspecified cirrhosis of liver: Secondary | ICD-10-CM | POA: Insufficient documentation

## 2020-10-13 MED ORDER — ALBUMIN HUMAN 25 % IV SOLN: 25.0000 g | Freq: Once | INTRAVENOUS | Status: AC

## 2020-10-13 MED ORDER — ALBUMIN HUMAN 25 % IV SOLN
INTRAVENOUS | Status: AC
  Administered 2020-10-13: 15:00:00 25 g via INTRAVENOUS
  Filled 2020-10-13: qty 100

## 2020-10-13 MED ORDER — ALBUMIN HUMAN 25 % IV SOLN
INTRAVENOUS | Status: AC
  Administered 2020-10-13: 16:00:00 25 g via INTRAVENOUS
  Filled 2020-10-13: qty 100

## 2020-10-13 MED ORDER — SODIUM CHLORIDE FLUSH 0.9 % IV SOLN
INTRAVENOUS | Status: AC
  Filled 2020-10-13: qty 10

## 2020-11-05 ENCOUNTER — Other Ambulatory Visit: Payer: Self-pay | Admitting: Family Medicine

## 2020-11-05 NOTE — Telephone Encounter (Signed)
I spoke with Mitzi she states pt will be out of keppra by tomorrow.  She is worried that if Ms. Leaman stops Keppra abruptly she will have seizures.  I stated we don't have a provider in the office this afternoon but I will send a 30 supply to [facility] now.    Pt will be establishing care with Dr. Brita Romp on 11/10/2020.  (Former pt of [facility])   Thanks,   -Mickel Baas

## 2020-11-05 NOTE — Telephone Encounter (Signed)
Mitzi from [facility] called and stated that the pt is out of levETIRAcetam (KEPPRA) 500 MG tablet and is worried she will have a stroke if she doesn't get this refill/ Mitzi is asking if DR. Bacigalupo can send in a refill today just until the pts next appt on the 6th / she also would like to speak with Nurse or Dr. B about not seeing a seizure diagnoses for the pt and wanted to get information on who the Neurologist is / please advise asap

## 2020-11-05 NOTE — Telephone Encounter (Signed)
Notes to clinic: Patient has appt on 7/8/20022 Review for refill   Requested Prescriptions  Pending Prescriptions Disp Refills   spironolactone (ALDACTONE) 100 MG tablet [Pharmacy Med Name: SPIRONOLACTONE 100 MG TAB] 90 tablet 0    Sig: TAKE 1 TABLET BY MOUTH DAILY      Cardiovascular: Diuretics - Aldosterone Antagonist Failed - 11/05/2020 10:14 AM      Failed - Na in normal range and within 360 days    Sodium  Date Value Ref Range Status  08/09/2020 131 (L) 135 - 145 mmol/L Final  12/05/2019 129 (L) 134 - 144 mmol/L Final          Passed - Cr in normal range and within 360 days    Creat  Date Value Ref Range Status  07/09/2018 0.98 (H) 0.60 - 0.93 mg/dL Final    Comment:    For patients >61 years of age, the reference limit for Creatinine is approximately 13% higher for people identified as African-American. .    Creatinine, Ser  Date Value Ref Range Status  08/09/2020 0.81 0.44 - 1.00 mg/dL Final   Creatinine, POC  Date Value Ref Range Status  08/30/2017 NA mg/dL Final          Passed - K in normal range and within 360 days    Potassium  Date Value Ref Range Status  08/09/2020 3.8 3.5 - 5.1 mmol/L Final          Passed - Last BP in normal range    BP Readings from Last 1 Encounters:  10/13/20 (!) 96/59          Passed - Valid encounter within last 6 months    Recent Outpatient Visits           4 months ago Liver cirrhosis secondary to NASH [facility])   [facility] [place], [place], [place]   5 months ago Hyponatremia   [facility] [place], [place], [place]   6 months ago Acute upper abdominal pain   [facility] Chrismon, Vickki Muff, PA-C   11 months ago Annual physical exam   [place], [place], [place]   1 year ago Annual physical exam   [place], Clearnce Sorrel, [place]       Future Appointments             In 1 week Bacigalupo, Dionne Bucy, MD [facility], [place]   In 2 months Vanga, Tally Due, MD [facility] [place]               levETIRAcetam ([employer]) 500 MG tablet [Pharmacy Med Name: LEVETIRACETAM 500 MG TAB] 60 tablet 0    Sig: TAKE ONE TABLET BY MOUTH TWICE DAILY      Not Delegated - Neurology:  Anticonvulsants Failed - 11/05/2020 10:14 AM      Failed - This refill cannot be delegated      Failed - PLT in normal range and within 360 days    Platelets  Date Value Ref Range Status  07/26/2020 67 (L) 150 - 400 K/uL Final  12/05/2019 52 (LL) 150 - 450 x10E3/uL Final    Comment:    Actual platelet count may be somewhat higher than reported due to aggregation of platelets in this sample.           Failed - WBC in normal range and within 360 days    WBC  Date Value Ref Range Status  07/26/2020  2.6 (L) 4.0 - 10.5 K/uL Final          Passed - HCT in normal range and within 360 days    HCT  Date Value Ref Range Status  07/26/2020 36.1 36.0 - 46.0 % Final   Hematocrit  Date Value Ref Range Status  12/05/2019 33.6 (L) 34.0 - 46.6 % Final          Passed - HGB in normal range and within 360 days    Hemoglobin  Date Value Ref Range Status  07/26/2020 12.1 12.0 - 15.0 g/dL Final  12/05/2019 11.5 11.1 - 15.9 g/dL Final          Passed - Valid encounter within last 12 months    Recent Outpatient Visits           4 months ago Liver cirrhosis secondary to NASH [facility])   [place], [place]   5 months ago Hyponatremia   [place], [place]   6 months ago Acute upper abdominal pain   [facility] Chrismon, Vickki Muff, PA-C   11 months ago Annual physical exam   [place], Clearnce Sorrel, [place]   1 year ago Annual physical exam   [place], [place], [place]       Future Appointments             In 1 week Bacigalupo, Dionne Bucy, MD [facility], [place]   In 2  months Vanga, Tally Due, MD [place]

## 2020-11-12 ENCOUNTER — Telehealth: Payer: Medicare HMO | Admitting: Family Medicine

## 2020-11-17 ENCOUNTER — Telehealth: Payer: Self-pay | Admitting: Gastroenterology

## 2020-11-17 DIAGNOSIS — K7460 Unspecified cirrhosis of liver: Secondary | ICD-10-CM

## 2020-11-17 DIAGNOSIS — K7581 Nonalcoholic steatohepatitis (NASH): Secondary | ICD-10-CM

## 2020-11-17 MED ORDER — ALBUMIN HUMAN 25 % IV SOLN: INTRAVENOUS | 0 refills | Status: DC

## 2020-11-17 NOTE — Telephone Encounter (Signed)
We did not do labs at last Paracentesis. Put the order in for Paracentesis

## 2020-11-17 NOTE — Telephone Encounter (Signed)
Patient is taking Spironolactone 11m and Lasix 499monce a day. She states the swelling began to get worse again 2 weeks after she had the paracentesis on 10/13/2020. She states her stomach stays swollen all the time. She states it looks like she is 6 month pregnant. Her leg swelling goes down at night and she keeps them elevated at night when she is sleeping

## 2020-11-17 NOTE — Telephone Encounter (Signed)
Let's schedule paracentesis with 50gm albumin post paracentesis  RV

## 2020-11-17 NOTE — Telephone Encounter (Signed)
Patient is schedule for Paracentesis for 11/19/2020 arrive to medical mall at 10:30am for a 11:00am scan. Informed patient and she verbalized understanding of time and place

## 2020-11-17 NOTE — Telephone Encounter (Signed)
Do you want any labs with the Paracentesis

## 2020-11-17 NOTE — Telephone Encounter (Signed)
Patient is having swelling in abdomin and legs (0-10 @ 8). Please advise.

## 2020-11-17 NOTE — Telephone Encounter (Signed)
Called and left a message for special scheduling to schedule the paracentesis

## 2020-11-17 NOTE — Addendum Note (Signed)
Addended by: Ulyess Blossom L on: 11/17/2020 01:47 PM   Modules accepted: Orders

## 2020-11-19 ENCOUNTER — Ambulatory Visit
Admission: RE | Admit: 2020-11-19 | Discharge: 2020-11-19 | Disposition: A | Discharge status: Home / Self Care | Source: Ambulatory Visit | Attending: Gastroenterology | Admitting: Gastroenterology

## 2020-11-19 ENCOUNTER — Other Ambulatory Visit: Payer: Self-pay

## 2020-11-19 DIAGNOSIS — K7460 Unspecified cirrhosis of liver: Secondary | ICD-10-CM | POA: Diagnosis present

## 2020-11-19 DIAGNOSIS — K7581 Nonalcoholic steatohepatitis (NASH): Secondary | ICD-10-CM | POA: Insufficient documentation

## 2020-11-19 MED ORDER — ALBUMIN HUMAN 25 % IV SOLN
INTRAVENOUS | Status: AC
  Filled 2020-11-19: qty 100

## 2020-11-19 MED ORDER — ALBUMIN HUMAN 25 % IV SOLN: 12.5000 g | Freq: Once | INTRAVENOUS | Status: DC

## 2020-11-19 MED ORDER — ALBUMIN HUMAN 25 % IV SOLN
INTRAVENOUS | Status: AC
  Administered 2020-11-19: 25 g
  Filled 2020-11-19: qty 100

## 2020-11-19 MED ORDER — ALBUMIN HUMAN 25 % IV SOLN
50.0000 g | Freq: Once | INTRAVENOUS | Status: AC
  Administered 2020-11-19: 25 g via INTRAVENOUS

## 2020-11-19 NOTE — Procedures (Signed)
Pre Procedural Dx: Symptomatic Ascites Post Procedural Dx: Same  Successful US guided paracentesis yielding 5.4 L of serous ascitic fluid.  EBL: None Complications: None immediate  Ronny Bacon, MD Pager #: [phone]

## 2020-11-25 ENCOUNTER — Telehealth: Payer: Self-pay

## 2020-11-25 NOTE — Telephone Encounter (Signed)
Copied from [place] [id number]. Topic: General - Other >> Nov 25, 2020  4:17 PM Tessa Lerner A wrote: Reason for CRM: Ivin Booty with [facility] has called with questions regarding patients medication allergies  Ivin Booty would like to know if the patient is allergic to Tylenol or Hydrocodone specifically   Please contact to further advise when possible

## 2020-11-26 NOTE — Telephone Encounter (Signed)
Called and spoke with daughter Clenton Pare who denied that patient had allergy to either medications. Will contact [facility] back and advise. KW

## 2020-11-26 NOTE — Telephone Encounter (Signed)
I contacted [facility] back and advised Ivin Booty of what daughter reported.KW

## 2020-11-26 NOTE — Telephone Encounter (Signed)
Noted  

## 2020-11-30 ENCOUNTER — Other Ambulatory Visit: Payer: Self-pay | Admitting: Family Medicine

## 2020-11-30 ENCOUNTER — Telehealth: Payer: Self-pay

## 2020-11-30 DIAGNOSIS — K7460 Unspecified cirrhosis of liver: Secondary | ICD-10-CM

## 2020-11-30 MED ORDER — ALBUMIN HUMAN 25 % IV SOLN: INTRAVENOUS | 0 refills | Status: AC

## 2020-11-30 NOTE — Telephone Encounter (Signed)
Jennifer [name] got patient scheduled for 1:30pm on 12/01/2020 at the medical mall

## 2020-11-30 NOTE — Addendum Note (Signed)
Addended by: Ulyess Blossom L on: 11/30/2020 12:21 PM   Modules accepted: Orders

## 2020-11-30 NOTE — Telephone Encounter (Signed)
Pt wanted to know when Dr. Marius Ditch will set her up for another paracentesis. She doesn't want to fill back up again and she has started gaining weight. She was hoping to have it done this week but I told her this would need to be scheduled.

## 2020-11-30 NOTE — Telephone Encounter (Signed)
Order a paracentesis for patient and fax the order form. Called and left a message for call back for special procedure at [phone]

## 2020-11-30 NOTE — Telephone Encounter (Signed)
Informed patient of the instructions

## 2020-11-30 NOTE — Telephone Encounter (Signed)
Requested medications are due for refill today.  yes  Requested medications are on the active medications list.  yes  Last refill. 11/05/2020 for both  Future visit scheduled.   yes  Notes to clinic.  One medication not delegated. Pt is shown as under the care of Dr. Rogue Bussing

## 2020-12-01 ENCOUNTER — Other Ambulatory Visit: Payer: Self-pay

## 2020-12-01 ENCOUNTER — Ambulatory Visit
Admission: RE | Admit: 2020-12-01 | Discharge: 2020-12-01 | Disposition: A | Discharge status: Home / Self Care | Source: Ambulatory Visit | Attending: Gastroenterology | Admitting: Gastroenterology

## 2020-12-01 DIAGNOSIS — K7581 Nonalcoholic steatohepatitis (NASH): Secondary | ICD-10-CM | POA: Insufficient documentation

## 2020-12-01 DIAGNOSIS — K7460 Unspecified cirrhosis of liver: Secondary | ICD-10-CM | POA: Insufficient documentation

## 2020-12-01 MED ORDER — ALBUMIN HUMAN 25 % IV SOLN: 25.0000 g | Freq: Once | INTRAVENOUS | Status: AC

## 2020-12-01 MED ORDER — ALBUMIN HUMAN 25 % IV SOLN
INTRAVENOUS | Status: AC
  Administered 2020-12-01: 25 g via INTRAVENOUS
  Filled 2020-12-01: qty 100

## 2020-12-14 ENCOUNTER — Telehealth: Payer: Self-pay | Admitting: Gastroenterology

## 2020-12-14 DIAGNOSIS — K7460 Unspecified cirrhosis of liver: Secondary | ICD-10-CM

## 2020-12-14 DIAGNOSIS — K7581 Nonalcoholic steatohepatitis (NASH): Secondary | ICD-10-CM

## 2020-12-14 NOTE — Telephone Encounter (Signed)
Patient wants to know if she can discuss the fluid that she's experiencing in her stomach and wants a sooner appt, per patient. Clinical staff will follow up with patient.

## 2020-12-14 NOTE — Telephone Encounter (Signed)
Patient states she is having abdominal fluid in stomach. At last paracentesis she had 4L removed which was 12/01/2020.  She states that she is worried it going to get as bad as it was back in June when they had to take 6.2L off her stomach. She states she does not want to get to that point and wants to know what you recommend. Patient has a follow up appointment with you on 01/11/2021

## 2020-12-15 ENCOUNTER — Other Ambulatory Visit: Payer: Self-pay | Admitting: Radiology

## 2020-12-15 MED ORDER — SPIRONOLACTONE 50 MG PO TABS: 150.0000 mg | ORAL_TABLET | Freq: Every day | ORAL | 0 refills | Status: DC

## 2020-12-15 MED ORDER — ALBUMIN HUMAN 25 % IV SOLN: INTRAVENOUS | 0 refills | Status: DC

## 2020-12-15 MED ORDER — FUROSEMIDE 80 MG PO TABS: 80.0000 mg | ORAL_TABLET | Freq: Every day | ORAL | 0 refills | Status: DC

## 2020-12-15 NOTE — Telephone Encounter (Signed)
Got patient schedule for paracentetics  12/16/2020 arrive at 1:15pm . Patient verbalized understanding of instructions of paracentesis and the increase in medication and repeat labs

## 2020-12-15 NOTE — Telephone Encounter (Signed)
Order paracentesis and fax order to special procedures. Called and left a message to get patient schedule

## 2020-12-16 ENCOUNTER — Other Ambulatory Visit: Payer: Self-pay

## 2020-12-16 ENCOUNTER — Ambulatory Visit
Admission: RE | Admit: 2020-12-16 | Discharge: 2020-12-16 | Disposition: A | Discharge status: Home / Self Care | Source: Ambulatory Visit | Attending: Gastroenterology | Admitting: Gastroenterology

## 2020-12-16 DIAGNOSIS — K7460 Unspecified cirrhosis of liver: Secondary | ICD-10-CM | POA: Diagnosis not present

## 2020-12-16 DIAGNOSIS — R188 Other ascites: Secondary | ICD-10-CM | POA: Diagnosis not present

## 2020-12-16 DIAGNOSIS — K7581 Nonalcoholic steatohepatitis (NASH): Secondary | ICD-10-CM | POA: Diagnosis not present

## 2020-12-16 MED ORDER — ALBUMIN HUMAN 25 % IV SOLN: 25.0000 g | Freq: Once | INTRAVENOUS | Status: AC

## 2020-12-16 MED ORDER — ALBUMIN HUMAN 25 % IV SOLN
INTRAVENOUS | Status: AC
  Administered 2020-12-16: 25 g via INTRAVENOUS
  Filled 2020-12-16: qty 200

## 2020-12-16 MED ORDER — ALBUMIN HUMAN 25 % IV SOLN
25.0000 g | Freq: Once | INTRAVENOUS | Status: AC
  Administered 2020-12-16: 25 g via INTRAVENOUS

## 2020-12-16 NOTE — Procedures (Signed)
PROCEDURE SUMMARY:  Successful US guided paracentesis from [facility].  Yielded 3.1 Liters of clear yellow fluid.  No immediate complications.  Pt tolerated well.   Specimen was not sent for labs.  EBL < 18m  MRockney Ghee8/03/2021 1:58 PM

## 2020-12-22 DIAGNOSIS — K7581 Nonalcoholic steatohepatitis (NASH): Secondary | ICD-10-CM | POA: Diagnosis not present

## 2020-12-22 DIAGNOSIS — K7460 Unspecified cirrhosis of liver: Secondary | ICD-10-CM | POA: Diagnosis not present

## 2020-12-23 LAB — BASIC METABOLIC PANEL
BUN/Creatinine Ratio: 20 (ref 12–28)
BUN: 20 mg/dL (ref 8–27)
CO2: 21 mmol/L (ref 20–29)
Calcium: 9.8 mg/dL (ref 8.7–10.3)
Chloride: 94 mmol/L — ABNORMAL LOW (ref 96–106)
Creatinine, Ser: 1 mg/dL (ref 0.57–1.00)
Glucose: 137 mg/dL — ABNORMAL HIGH (ref 65–99)
Potassium: 4.3 mmol/L (ref 3.5–5.2)
Sodium: 135 mmol/L (ref 134–144)
eGFR: 57 mL/min/{1.73_m2} — ABNORMAL LOW (ref >59)

## 2020-12-24 ENCOUNTER — Telehealth: Payer: Self-pay | Admitting: Gastroenterology

## 2020-12-24 ENCOUNTER — Telehealth: Payer: Self-pay

## 2020-12-24 NOTE — Telephone Encounter (Signed)
Patient verbalized understanding of results. She states she abdominal swelling has improved. She will keep doing the same does of Lasix and spironolactone.

## 2020-12-24 NOTE — Telephone Encounter (Signed)
-----   Message from Lin Landsman, MD sent at 12/24/2020  8:34 AM EDT ----- Her Cr and electrolytes are looking normal. Please ask her if she is still taking lasix 52m and spironolactone 1547mdaily and if her abdomen swelling is improving. We can continue this dose for now  RV

## 2020-12-24 NOTE — Telephone Encounter (Signed)
Called patient back and documented in other telephone call

## 2020-12-24 NOTE — Telephone Encounter (Signed)
Calling for lab results. °

## 2020-12-24 NOTE — Telephone Encounter (Signed)
Called and left a message for call back  

## 2020-12-27 ENCOUNTER — Other Ambulatory Visit: Payer: Self-pay | Admitting: Physician Assistant

## 2020-12-27 DIAGNOSIS — E1142 Type 2 diabetes mellitus with diabetic polyneuropathy: Secondary | ICD-10-CM

## 2020-12-29 ENCOUNTER — Other Ambulatory Visit: Payer: Self-pay | Admitting: Family Medicine

## 2020-12-29 NOTE — Telephone Encounter (Signed)
Requested medication (s) are due for refill today - no  Requested medication (s) are on the active medication list -yes  Future visit scheduled -yes  Last refill: Keppra-12/01/20 #60 1RF Aldactone- 12/15/20 #90  Notes to clinic: Request RF- non delegated Rx- future RF request and outside provider - sent for review   Requested Prescriptions  Pending Prescriptions Disp Refills   levETIRAcetam (KEPPRA) 500 MG tablet [Pharmacy Med Name: LEVETIRACETAM 500 MG TAB] 60 tablet 1    Sig: TAKE ONE TABLET BY MOUTH TWICE DAILY     Not Delegated - Neurology:  Anticonvulsants Failed - 12/29/2020  8:38 AM      Failed - This refill cannot be delegated      Failed - PLT in normal range and within 360 days    Platelets  Date Value Ref Range Status  07/26/2020 67 (L) 150 - 400 K/uL Final  12/05/2019 52 (LL) 150 - 450 x10E3/uL Final    Comment:    Actual platelet count may be somewhat higher than reported due to aggregation of platelets in this sample.           Failed - WBC in normal range and within 360 days    WBC  Date Value Ref Range Status  07/26/2020 2.6 (L) 4.0 - 10.5 K/uL Final          Passed - HCT in normal range and within 360 days    HCT  Date Value Ref Range Status  07/26/2020 36.1 36.0 - 46.0 % Final   Hematocrit  Date Value Ref Range Status  12/05/2019 33.6 (L) 34.0 - 46.6 % Final          Passed - HGB in normal range and within 360 days    Hemoglobin  Date Value Ref Range Status  07/26/2020 12.1 12.0 - 15.0 g/dL Final  12/05/2019 11.5 11.1 - 15.9 g/dL Final          Passed - Valid encounter within last 12 months    Recent Outpatient Visits           6 months ago Liver cirrhosis secondary to NASH [facility])   [facility] [place], [place], [place]   7 months ago Hyponatremia   [facility] [place], [place], [place]   8 months ago Acute upper abdominal pain   [place], Vickki Muff, PA-C   1 year ago Annual  physical exam   [place], Clearnce [place], [place]   2 years ago Annual physical exam   [place], [place], [place]       Future Appointments             In 1 week Vanga, Tally Due, MD [place]   In 2 weeks Bacigalupo, Dionne Bucy, MD [facility]             spironolactone (ALDACTONE) 100 MG tablet [Pharmacy Med Name: SPIRONOLACTONE 100 MG TAB] 30 tablet 1    Sig: TAKE 1 TABLET BY MOUTH DAILY     Cardiovascular: Diuretics - Aldosterone Antagonist Failed - 12/29/2020  8:38 AM      Failed - Last BP in normal range    BP Readings from Last 1 Encounters:  12/16/20 (!) 98/47          Failed - Valid encounter within last 6 months    Recent Outpatient Visits           6 months ago  Liver cirrhosis secondary to NASH [facility])   [facility] [place], [place], [place]   7 months ago Hyponatremia   [facility] [name] M, [place]   8 months ago Acute upper abdominal pain   [place], PA-C   1 year ago Annual physical exam   [facility] [place], [place], [place]   2 years ago Annual physical exam   [place], [place], [place]       Future Appointments             In 1 week Vanga, Tally Due, MD [place]   In 2 weeks Brita Romp, Dionne Bucy, MD [facility], [facility]            Passed - Cr in normal range and within 360 days    Creat  Date Value Ref Range Status  07/09/2018 0.98 (H) 0.60 - 0.93 mg/dL Final    Comment:    For patients >22 years of age, the reference limit for Creatinine is approximately 13% higher for people identified as African-American. .    Creatinine, Ser  Date Value Ref Range Status  12/22/2020 1.00 0.57 - 1.00 mg/dL Final   Creatinine, POC  Date Value Ref Range Status  08/30/2017 NA mg/dL Final          Passed - K in normal  range and within 360 days    Potassium  Date Value Ref Range Status  12/22/2020 4.3 3.5 - 5.2 mmol/L Final          Passed - Na in normal range and within 360 days    Sodium  Date Value Ref Range Status  12/22/2020 135 134 - 144 mmol/L Final             Requested Prescriptions  Pending Prescriptions Disp Refills   levETIRAcetam (KEPPRA) 500 MG tablet [Pharmacy Med Name: LEVETIRACETAM 500 MG TAB] 60 tablet 1    Sig: TAKE ONE TABLET BY MOUTH TWICE DAILY     Not Delegated - Neurology:  Anticonvulsants Failed - 12/29/2020  8:38 AM      Failed - This refill cannot be delegated      Failed - PLT in normal range and within 360 days    Platelets  Date Value Ref Range Status  07/26/2020 67 (L) 150 - 400 K/uL Final  12/05/2019 52 (LL) 150 - 450 x10E3/uL Final    Comment:    Actual platelet count may be somewhat higher than reported due to aggregation of platelets in this sample.           Failed - WBC in normal range and within 360 days    WBC  Date Value Ref Range Status  07/26/2020 2.6 (L) 4.0 - 10.5 K/uL Final          Passed - HCT in normal range and within 360 days    HCT  Date Value Ref Range Status  07/26/2020 36.1 36.0 - 46.0 % Final   Hematocrit  Date Value Ref Range Status  12/05/2019 33.6 (L) 34.0 - 46.6 % Final          Passed - HGB in normal range and within 360 days    Hemoglobin  Date Value Ref Range Status  07/26/2020 12.1 12.0 - 15.0 g/dL Final  12/05/2019 11.5 11.1 - 15.9 g/dL Final          Passed - Valid encounter within last 12 months  Recent Outpatient Visits           6 months ago Liver cirrhosis secondary to NASH [facility])   [facility] [place], Anderson [place] M, [place]   7 months ago Hyponatremia   [facility] [name] M, [place]   8 months ago Acute upper abdominal pain   [place], PA-C   1 year ago Annual physical exam   [facility] Marlyn Corporal,  [place], [place]   2 years ago Annual physical exam   [place], Clearnce [place], [place]       Future Appointments             In 1 week Vanga, Tally Due, MD [place]   In 2 weeks Brita Romp, Dionne Bucy, MD [facility], [facility]             spironolactone (ALDACTONE) 100 MG tablet [Pharmacy Med Name: SPIRONOLACTONE 100 MG TAB] 30 tablet 1    Sig: TAKE 1 TABLET BY MOUTH DAILY     Cardiovascular: Diuretics - Aldosterone Antagonist Failed - 12/29/2020  8:38 AM      Failed - Last BP in normal range    BP Readings from Last 1 Encounters:  12/16/20 (!) 98/47          Failed - Valid encounter within last 6 months    Recent Outpatient Visits           6 months ago Liver cirrhosis secondary to NASH [facility])   [facility] [place], [place], [place]   7 months ago Hyponatremia   [facility] [place], Anderson Malta M, [place]   8 months ago Acute upper abdominal pain   [place], Vickki Muff, PA-C   1 year ago Annual physical exam   [place], Cle[place]ce [place], [place]   2 years ago Annual physical exam   [place], Cle[place], [place]       Future Appointments             In 1 week Vanga, Tally Due, MD [place]   In 2 weeks Brita Romp, Dionne Bucy, MD [facility]            Passed - Cr in normal range and within 360 days    Creat  Date Value Ref Range Status  07/09/2018 0.98 (H) 0.60 - 0.93 mg/dL Final    Comment:    For patients >60 years of age, the reference limit for Creatinine is approximately 13% higher for people identified as African-American. .    Creatinine, Ser  Date Value Ref Range Status  12/22/2020 1.00 0.57 - 1.00 mg/dL Final   Creatinine, POC  Date Value Ref Range Status  08/30/2017 NA mg/dL Final          Passed - K in normal range and within 360 days    Potassium  Date Value  Ref Range Status  12/22/2020 4.3 3.5 - 5.2 mmol/L Final          Passed - Na in normal range and within 360 days    Sodium  Date Value Ref Range Status  12/22/2020 135 134 - 144 mmol/L Final

## 2020-12-29 NOTE — Telephone Encounter (Signed)
Please review for Dr. Jacinto Reap.  Pt has an appointment coming up 01/13/2021  Thanks,   -Mickel Baas

## 2020-12-31 ENCOUNTER — Other Ambulatory Visit: Payer: Self-pay | Admitting: Physician Assistant

## 2020-12-31 ENCOUNTER — Other Ambulatory Visit: Payer: Self-pay | Admitting: Gastroenterology

## 2020-12-31 DIAGNOSIS — Z8719 Personal history of other diseases of the digestive system: Secondary | ICD-10-CM

## 2020-12-31 DIAGNOSIS — K7581 Nonalcoholic steatohepatitis (NASH): Secondary | ICD-10-CM

## 2021-01-11 ENCOUNTER — Encounter: Payer: Self-pay | Admitting: Gastroenterology

## 2021-01-11 ENCOUNTER — Ambulatory Visit: Payer: Medicare HMO | Admitting: Gastroenterology

## 2021-01-11 ENCOUNTER — Other Ambulatory Visit: Payer: Self-pay

## 2021-01-11 VITALS — BP 109/61 | HR 73 | Temp 97.7°F | Ht 65.0 in | Wt 148.0 lb

## 2021-01-11 DIAGNOSIS — K7290 Hepatic failure, unspecified without coma: Secondary | ICD-10-CM | POA: Diagnosis not present

## 2021-01-11 DIAGNOSIS — R188 Other ascites: Secondary | ICD-10-CM

## 2021-01-11 DIAGNOSIS — K7460 Unspecified cirrhosis of liver: Secondary | ICD-10-CM | POA: Diagnosis not present

## 2021-01-11 DIAGNOSIS — K7581 Nonalcoholic steatohepatitis (NASH): Secondary | ICD-10-CM

## 2021-01-11 NOTE — Patient Instructions (Addendum)
Your RUQ ultrasound is schedule for 01/19/2021 at 9:30am arrive at 9:15am at out patient imaging. Nothing to eat or drink after midnight. Address is [address], [place], [place] [address]. Phone number is [phone]

## 2021-01-11 NOTE — Progress Notes (Signed)
Jennifer Darby, MD [address]  [place]  [place],  [address]  Main: [phone]  Fax: [phone]    Gastroenterology Consultation  Referring Provider:     Florian Buff* Primary Care Physician:  Cammie Sickle, MD Primary Gastroenterologist:  Dr. Cephas [name] Reason for Consultation:   Decompensated NASH Cirrhosis        HPI:   Jennifer [name] is a 80 y.o. female referred by Dr. Cammie Sickle, MD  for consultation & management of decompensated cirrhosis.  Patient has been recently managed for recurrent ascites requiring therapeutic paracentesis. She needed therapeutic paracentesis x4 since 10/2020, most recently on 12/16/2020.  I increased her diuretics to Lasix 40 mg twice daily and spironolactone 150 mg twice daily which helped with recurrence of ascites.  Her swelling of legs has resolved and her ascites is currently under control.  Her weight is almost back to baseline at 148 pounds.  She reports that canned tomato and basal soup has likely contributed to recurrent ascites.  She she is now watchful of sodium intake.  Otherwise, she does not have any concerns today  NSAIDs: none   Antiplts/Anticoagulants/Anti thrombotics: none   GI Procedures:   EGD 01/17/18 - Normal duodenal bulb and second portion of the duodenum. - Portal hypertensive gastropathy. - A few gastric polyps.resected, Clip (MR conditional) was placed. - Large (> 5 mm) esophageal varices, banding was not performed due to device malfunction.  DIAGNOSIS:  A. STOMACH POLYPS X2, BODY; HOT SNARE:  - HYPERPLASTIC TYPE GASTRIC POLYP.  - NEGATIVE FOR H. PYLORI, DYSPLASIA, AND MALIGNANCY.   For EGD 11/01/2017 - Normal duodenal bulb and second portion of the duodenum. - Portal hypertensive gastropathy. - A few gastric polyps. - Large (> 5 mm) esophageal varices. - No specimens collected.  EGD 09/20/2017 - Normal duodenal bulb and second portion of the duodenum. - Portal  hypertensive gastropathy. - Recently bleeding large (> 5 mm) esophageal varices. Incompletely eradicated. Bandedx3. - No specimens collected.  EGD 08/16/2017 - Normal duodenal bulb and second portion of the duodenum. - Portal hypertensive gastropathy. - Recently bleeding large (> 5 mm) esophageal varices. Incompletely eradicated. Banded x 4. - No specimens collected.  EGD 07/12/2017 - Normal duodenal bulb and second portion of the duodenum. - Portal hypertensive gastropathy. - Bleeding large (> 5 mm) esophageal varices. Incompletely eradicated. Banded. - No specimens collected.  Colonoscopy 04/03/2005 normal  Colonoscopy 02/01/2015 - One 8 mm polyp in the ascending colon. Resected and retrieved. Clips were placed. - One 4 mm polyp in the transverse colon. Resected and retrieved. - One 7 mm polyp in the sigmoid colon. Resected and retrieved. Clip was placed. - Non-bleeding internal hemorrhoids. 1. Colon, polyp(s), ascending colon polyp - SESSILE SERRATED POLYP. - NO DYSPLASIA OR MALIGNANCY. 2. Colon, polyp(s), transverse colon polyp - TUBULAR ADENOMA. - NO HIGH GRADE DYSPLASIA OR MALIGNANCY. 3. Colon, polyp(s), sigmoid colon polyp - TUBULAR ADENOMA. - NO HIGH GRADE DYSPLASIA OR MALIGNANCY.  Colonoscopy 11/22/2017 - One 5 mm polyp in the proximal ascending colon, removed with a cold biopsy forceps. Resected and retrieved. - Rectal varices. - Non-bleeding internal hemorrhoids. DIAGNOSIS:  A.  COLON POLYP X 1, ASCENDING; COLD BIOPSY:  - TUBULAR ADENOMA.  - NEGATIVE FOR HIGH-GRADE DYSPLASIA AND MALIGNANCY.   Past Medical History:  Diagnosis Date   Acute upper GI bleed 07/12/2017   Allergy    Arthritis    Asthma    Colon polyp  Fatty liver    GERD (gastroesophageal reflux disease)    Hyperlipidemia    Hypertension    Motion sickness    boats   Upper respiratory infection 03/17/2016    Past Surgical History:  Procedure Laterality Date   ABDOMINAL HYSTERECTOMY  1978    COLONOSCOPY WITH PROPOFOL N/A 02/01/2015   Procedure: COLONOSCOPY WITH PROPOFOL;  Surgeon: Lucilla Lame, MD;  Location: [place];  Service: Endoscopy;  Laterality: N/A;  Diabetic - oral meds   COLONOSCOPY WITH PROPOFOL N/A 11/22/2017   Procedure: COLONOSCOPY WITH PROPOFOL;  Surgeon: Lin Landsman, MD;  Location: [facility] ENDOSCOPY;  Service: Gastroenterology;  Laterality: N/A;   ESOPHAGOGASTRODUODENOSCOPY (EGD) WITH PROPOFOL N/A 07/12/2017   Procedure: ESOPHAGOGASTRODUODENOSCOPY (EGD) WITH PROPOFOL;  Surgeon: Lin Landsman, MD;  Location: [facility] ENDOSCOPY;  Service: Gastroenterology;  Laterality: N/A;   ESOPHAGOGASTRODUODENOSCOPY (EGD) WITH PROPOFOL N/A 08/16/2017   Procedure: ESOPHAGOGASTRODUODENOSCOPY (EGD) WITH PROPOFOL;  Surgeon: Lin Landsman, MD;  Location[facility] ENDOSCOPY;  Service: Gastroenterology;  Laterality: N/A;   ESOPHAGOGASTRODUODENOSCOPY (EGD) WITH PROPOFOL N/A 09/20/2017   Procedure: ESOPHAGOGASTRODUODENOSCOPY (EGD) WITH PROPOFOL;  Surgeon: Lin Landsman, MD;  Location: [facility] ENDOSCOPY;  Service: Gastroenterology;  Laterality: N/A;   ESOPHAGOGASTRODUODENOSCOPY (EGD) WITH PROPOFOL N/A 11/01/2017   Procedure: ESOPHAGOGASTRODUODENOSCOPY (EGD) WITH PROPOFOL;  Surgeon: Lin Landsman, MD;  Location: [facility] ENDOSCOPY;  Service: Gastroenterology;  Laterality: N/A;   ESOPHAGOGASTRODUODENOSCOPY (EGD) WITH PROPOFOL N/A 01/17/2018   Procedure: ESOPHAGOGASTRODUODENOSCOPY (EGD) WITH PROPOFOL;  Surgeon: Lin Landsman, MD;  Location: [facility] ENDOSCOPY;  Service: Gastroenterology;  Laterality: N/A;   ESOPHAGOGASTRODUODENOSCOPY (EGD) WITH PROPOFOL N/A 06/20/2018   Procedure: ESOPHAGOGASTRODUODENOSCOPY (EGD) WITH PROPOFOL;  Surgeon: Lin Landsman, MD;  Location: [place];  Service: Gastroenterology;  Laterality: N/A;  EGD/ with banding ligation    IR RADIOLOGIST EVAL & MGMT  07/09/2018   POLYPECTOMY  02/01/2015   Procedure: POLYPECTOMY;  Surgeon: Lucilla Lame, MD;   Location: [place];  Service: Endoscopy;;   TONSILLECTOMY AND ADENOIDECTOMY       Current Outpatient Medications:    ACCU-CHEK GUIDE test strip, USE TO CHECK BLOOD SUGAR DAILY, Disp: 100 each, Rfl: 3   albumin human 25 % bottle, Inject 50 grams no matter what, Disp: 50 mL, Rfl: 0   albumin human 25 % bottle, Inject 25 grams no matter what if 5L or more is removed another 25 grams, Disp: 50 mL, Rfl: 0   albuterol (VENTOLIN HFA) 108 (90 Base) MCG/ACT inhaler, INHALE 1-2 PUFFS INTO THE LUNGS EVERY 6 HOURS AS NEEDED FOR WHEEZING OR SHORTNESS OF BREATH, Disp: 18 g, Rfl: 1   Blood Glucose Monitoring Suppl (ACCU-CHEK AVIVA PLUS) w/Device KIT, To check blood sugar daily, Disp: 1 kit, Rfl: 0   cholecalciferol (VITAMIN D) 25 MCG tablet, Take 1 tablet (1,000 Units total) by mouth daily., Disp: 30 tablet, Rfl: 0   furosemide (LASIX) 80 MG tablet, TAKE ONE TABLET BY MOUTH EVERY DAY, Disp: 30 tablet, Rfl: 1   lactulose (CHRONULAC) 10 GM/15ML solution, Take 30 mLs (20 g total) by mouth 2 (two) times daily., Disp: 1892 mL, Rfl: 1   Lancets (ACCU-CHEK SOFT TOUCH) lancets, To check blood sugar daily, Disp: 100 each, Rfl: 12   levETIRAcetam (KEPPRA) 500 MG tablet, TAKE ONE TABLET BY MOUTH TWICE DAILY, Disp: 60 tablet, Rfl: 0   liver oil-zinc oxide (DESITIN) 40 % ointment, Apply topically as needed for irritation., Disp: 56.7 g, Rfl: 0   Magnesium 200 MG TABS, Take 1 tablet by mouth daily., Disp: ,  Rfl:    nadolol (CORGARD) 20 MG tablet, TAKE ONE-HALF TABLET BY MOUTH EVERY DAY, Disp: 45 tablet, Rfl: 3   pantoprazole (PROTONIX) 40 MG tablet, TAKE ONE (1) TABLET BY MOUTH TWO TIMES PER DAY, Disp: 180 tablet, Rfl: 0   rifaximin (XIFAXAN) 550 MG TABS tablet, Take 1 tablet (550 mg total) by mouth 2 (two) times daily., Disp: 60 tablet, Rfl: 1   spironolactone (ALDACTONE) 100 MG tablet, TAKE 1 TABLET BY MOUTH DAILY, Disp: 30 tablet, Rfl: 0   Family History  Problem Relation Age of Onset   Dementia Mother     Alcohol abuse Father    Cancer Sister        lung   Lymphoma Sister    Melanoma Sister    Lymphoma Sister      Social History   Tobacco Use   Smoking status: Former    Packs/day: 0.50    Years: 6.00    Pack years: 3.00    Types: Cigarettes    Quit date: 05/08/1987    Years since quitting: 33.7   Smokeless tobacco: Never  Vaping Use   Vaping Use: Never used  Substance Use Topics   Alcohol use: Never   Drug use: Never    Allergies as of 01/11/2021 - Review Complete 01/11/2021  Allergen Reaction Noted   Amoxicillin Other (See Comments) 07/17/2018   Codeine Other (See Comments) 10/14/2014   Hydrocodone-acetaminophen Other (See Comments) 10/14/2014   Nitrofurantoin Other (See Comments) 11/05/2014   Nitrofurantoin monohyd macro  10/14/2014   Penicillins  10/14/2014   Sulfa antibiotics Other (See Comments) 10/14/2014    Review of Systems:    All systems reviewed and negative except where noted in HPI.   Physical Exam:  BP 109/61 (BP Location: Left Arm, Patient Position: Sitting, Cuff Size: Normal)   Pulse 73   Temp 97.7 F (36.5 C) (Oral)   Ht 5' 5"  (1.651 m)   Wt 148 lb (67.1 kg)   BMI 24.63 kg/m  No LMP recorded. Patient has had a hysterectomy.  General:   Alert,  Well-developed, well-nourished, pleasant and cooperative in NAD Head:  Normocephalic and atraumatic. Eyes:  Sclera clear, no icterus.   Conjunctiva pink. Ears:  Normal auditory acuity. Nose:  No deformity, discharge, or lesions. Mouth:  No deformity or lesions,oropharynx pink & moist. Neck:  Supple; no masses or thyromegaly. Lungs:  Respirations even and unlabored.  Clear throughout to auscultation.   No wheezes, crackles, or rhonchi. No acute distress. Heart:  Regular rate and rhythm; no murmurs, clicks, rubs, or gallops. Abdomen:  Normal bowel sounds. Soft, non-tender and nondistended, dull in bilateral flanks, tympanic in the central abdomen, no hepatosplenomegaly.  No guarding or rebound  tenderness.   Rectal: Not performed Msk:  Symmetrical without gross deformities. Good, equal movement & strength bilaterally. Pulses:  Normal pulses noted. Extremities:  No clubbing, 3+ edema.  No cyanosis. Neurologic:  Alert and oriented x3;  grossly normal neurologically. Skin:  Intact without significant lesions or rashes. No jaundice. Psych:  Alert and cooperative. Normal mood and affect.  Imaging Studies: reviewed  Assessment and Plan:   Jennifer [name] is a 80 y.o. Caucasian female with metabolic syndrome, diabetes, hypertension, hyperlipidemia seen for follow-up of decompensated cirrhosis with ascites and history of variceal bleed s/p ligation.  Decompensated cirrhosis: probably secondary to NASH in the setting of metabolic syndrome Hepatitis B and C negative, received hepatitis B vaccine in 2013. Normal ferritin levels Portal hypertension: manifested as ascites, thrombocytopenia,  esophageal varices Volume overload: Patient is requiring periodic therapeutic paracentesis due to recurrence of ascites, increased spironolactone 150 mg, and Lasix to 40 mg twice daily, continue the current dose, continue low-sodium diet.  She refused TIPS Check BMP today 2D echo revealed grade 1 diastolic dysfunction Recommend therapeutic paracentesis with administration of 50 g of albumin Esophageal varices: status post banding on 07/12/2017. EGD in 08/2017, ligation performed.  EGD in 10/2017, ligation was not performed due to extensive scarring from prior banding. Continue nadolol, patient is currently taking 10 mg daily, Attempted variceal banding in 01/2018, unsuccessful due to device malfunction. Repeat EGD in 06/2018, banding was not performed due to extensive scarring.  She was referred to IR for TIPS placement and she was deemed to be a candidate for it however, patient decided not to proceed with it and she is persistent about this decision.   Anemia/thrombocytopenia/coagulopathy: No evidence of  anemia,does not have B12 or iron deficiency, mild thrombocytopenia and stable PSE: on rifaximin HCC surveillance: No evidence of liver lesions based on imaging, check serum AFP and right upper quadrant ultrasound HRS: None She is immune to hepatitis A and B, received vaccinations in 2013 Received Pneumovax and prevnar  History of tubular adenomas:  Repeat surveillance colonoscopy 11/2022   Follow up in 4 months   Jennifer Darby, MD

## 2021-01-12 LAB — BASIC METABOLIC PANEL
BUN/Creatinine Ratio: 20 (ref 12–28)
BUN: 20 mg/dL (ref 8–27)
CO2: 29 mmol/L (ref 20–29)
Calcium: 9.4 mg/dL (ref 8.7–10.3)
Chloride: 93 mmol/L — ABNORMAL LOW (ref 96–106)
Creatinine, Ser: 1.01 mg/dL — ABNORMAL HIGH (ref 0.57–1.00)
Glucose: 86 mg/dL (ref 65–99)
Potassium: 4 mmol/L (ref 3.5–5.2)
Sodium: 136 mmol/L (ref 134–144)
eGFR: 56 mL/min/{1.73_m2} — ABNORMAL LOW (ref >59)

## 2021-01-12 LAB — AFP TUMOR MARKER: AFP, Serum, Tumor Marker: 3.3 ng/mL (ref 0.0–8.7)

## 2021-01-13 ENCOUNTER — Telehealth: Payer: Self-pay

## 2021-01-13 ENCOUNTER — Encounter: Payer: Self-pay | Admitting: Family Medicine

## 2021-01-13 ENCOUNTER — Telehealth (INDEPENDENT_AMBULATORY_CARE_PROVIDER_SITE_OTHER): Payer: Medicare HMO | Admitting: Family Medicine

## 2021-01-13 DIAGNOSIS — I152 Hypertension secondary to endocrine disorders: Secondary | ICD-10-CM | POA: Diagnosis not present

## 2021-01-13 DIAGNOSIS — R569 Unspecified convulsions: Secondary | ICD-10-CM | POA: Diagnosis not present

## 2021-01-13 DIAGNOSIS — E1159 Type 2 diabetes mellitus with other circulatory complications: Secondary | ICD-10-CM | POA: Diagnosis not present

## 2021-01-13 DIAGNOSIS — I10 Essential (primary) hypertension: Secondary | ICD-10-CM | POA: Diagnosis not present

## 2021-01-13 DIAGNOSIS — E118 Type 2 diabetes mellitus with unspecified complications: Secondary | ICD-10-CM | POA: Diagnosis not present

## 2021-01-13 DIAGNOSIS — Z515 Encounter for palliative care: Secondary | ICD-10-CM | POA: Diagnosis not present

## 2021-01-13 DIAGNOSIS — K7581 Nonalcoholic steatohepatitis (NASH): Secondary | ICD-10-CM | POA: Diagnosis not present

## 2021-01-13 DIAGNOSIS — K7460 Unspecified cirrhosis of liver: Secondary | ICD-10-CM | POA: Diagnosis not present

## 2021-01-13 MED ORDER — LEVETIRACETAM 500 MG PO TABS: 500.0000 mg | ORAL_TABLET | Freq: Two times a day (BID) | ORAL | 5 refills | Status: AC

## 2021-01-13 NOTE — Progress Notes (Signed)
   Virtual telephone visit    Virtual Visit via Telephone Note   This visit type was conducted due to national recommendations for restrictions regarding the COVID-19 Pandemic (e.g. social distancing) in an effort to limit this patient's exposure and mitigate transmission in our community. Due to her co-morbid illnesses, this patient is at least at moderate risk for complications without adequate follow up. This format is felt to be most appropriate for this patient at this time. The patient did not have access to video technology or had technical difficulties with video requiring transitioning to audio format only (telephone). Physical exam was limited to content and character of the telephone converstion.    Patient location: Home Provider location: [facility]  I discussed the limitations of evaluation and management by telemedicine and the availability of in person appointments. The patient expressed understanding and agreed to proceed.   Visit Date: 01/13/2021  Today's healthcare provider:  , MD   Chief Complaint  Patient presents with   Follow-up    Subjective    HPI  Hypertension - Pt. states that BP at home ranges 120-170/60-80s - Currently taking Nadolol, Lasix, and Spironolactone - Denies lightheadedness or episodes of hypotension  Seizure-like Activity - Hospital admission in Jan 22 for new-onset seizure-like activity - EEG in hospital showed no epileptiform changes - Currently taking Keppra for seizure prevention - Denies known recurrent episodes of seizure-like activity since hospital d/c  Diabetes Mellitus - Not currently managed with medications - Denies recent episodes of hyperglycemia  Liver Cirrhosis 2/2 NASH - Following with GI and hospice - Notes symptom improvement since paracentesis last month    Medications: Outpatient Medications Prior to Visit  Medication Sig   ACCU-CHEK GUIDE test strip USE TO CHECK BLOOD  SUGAR DAILY   albumin human 25 % bottle Inject 50 grams no matter what   albumin human 25 % bottle Inject 25 grams no matter what if 5L or more is removed another 25 grams   albuterol (VENTOLIN HFA) 108 (90 Base) MCG/ACT inhaler INHALE 1-2 PUFFS INTO THE LUNGS EVERY 6 HOURS AS NEEDED FOR WHEEZING OR SHORTNESS OF BREATH   Blood Glucose Monitoring Suppl (ACCU-CHEK AVIVA PLUS) w/Device KIT To check blood sugar daily   cholecalciferol (VITAMIN D) 25 MCG tablet Take 1 tablet (1,000 Units total) by mouth daily.   furosemide (LASIX) 80 MG tablet TAKE ONE TABLET BY MOUTH EVERY DAY   lactulose (CHRONULAC) 10 GM/15ML solution Take 30 mLs (20 g total) by mouth 2 (two) times daily.   Lancets (ACCU-CHEK SOFT TOUCH) lancets To check blood sugar daily   liver oil-zinc oxide (DESITIN) 40 % ointment Apply topically as needed for irritation.   Magnesium 200 MG TABS Take 1 tablet by mouth daily.   nadolol (CORGARD) 20 MG tablet TAKE ONE-HALF TABLET BY MOUTH EVERY DAY   pantoprazole (PROTONIX) 40 MG tablet TAKE ONE (1) TABLET BY MOUTH TWO TIMES PER DAY   rifaximin (XIFAXAN) 550 MG TABS tablet Take 1 tablet (550 mg total) by mouth 2 (two) times daily.   spironolactone (ALDACTONE) 100 MG tablet TAKE 1 TABLET BY MOUTH DAILY   [DISCONTINUED] levETIRAcetam (KEPPRA) 500 MG tablet TAKE ONE TABLET BY MOUTH TWICE DAILY   No facility-administered medications prior to visit.    Review of Systems  Constitutional:  Negative for activity change, appetite change, fatigue and fever.  HENT: Negative.    Eyes: Negative.  Negative for visual disturbance.  Respiratory: Negative.  Negative for chest tightness and   shortness of breath.   Cardiovascular:  Positive for leg swelling. Negative for chest pain.  Gastrointestinal: Negative.   Endocrine: Negative.  Negative for polydipsia and polyuria.  Genitourinary: Negative.   Musculoskeletal: Negative.   Psychiatric/Behavioral: Negative.       Objective    There were no vitals  taken for this visit.     Assessment & Plan     Problem List Items Addressed This Visit       Cardiovascular and Mediastinum   Essential hypertension, benign    - Chronic and stable - Continue Nadolol, Lasix, & Spironolactone - Continue checking BP at home        Digestive   Liver cirrhosis secondary to NASH ([place]) - Primary    - Stable - Continue following with GI and hospice - Continue medications as prescribed by GI        Endocrine   Type 2 diabetes mellitus with unspecified complications (HCC)    - Chronic, previously stable, currently asymptomatic - Not currently managed with medications - Recheck labs at next visit        Other   Seizure-like activity ([place])    - Stable, no recurrent episodes of seizure-like activity since hospital discharge in Jan 22 - [place] for seizure prevention      Hospice care patient   - Pt. reports benefit from support of hospice care - Continue following with hospice    Other Visit Diagnoses     Hypertension associated with diabetes ([place])       - Chronic and stable - Continue Nadolol, Lasix, & Spironolactone - Continue checking BP at home         Return in about 6 months (around 07/13/2021) for chronic disease f/u, With new PCP.    I discussed the assessment and treatment plan with the patient. The patient was provided an opportunity to ask questions and all were answered. The patient agreed with the plan and demonstrated an understanding of the instructions.   The patient was advised to call back or seek an in-person evaluation if the symptoms worsen or if the condition fails to improve as anticipated.  I provided 14 minutes of non-face-to-face time during this encounter.   Percell Locus, MS3   Patient seen along with MS3 student Percell Locus. I personally evaluated this patient along with the student, and verified all aspects of the history, physical exam, and medical decision making as documented by the  student. I agree with the student's documentation and have made all necessary edits.  Daxton Nydam, Dionne Bucy, MD, MPH [facility]

## 2021-01-13 NOTE — Telephone Encounter (Signed)
Patient is calling for results of her blood work she had done this week.

## 2021-01-13 NOTE — Assessment & Plan Note (Signed)
-   Stable - Continue following with GI and hospice - Continue medications as prescribed by GI

## 2021-01-13 NOTE — Assessment & Plan Note (Signed)
-   Chronic and stable - Continue Nadolol, Lasix, & Spironolactone - Continue checking BP at home

## 2021-01-13 NOTE — Assessment & Plan Note (Signed)
-   Chronic, previously stable, currently asymptomatic - Not currently managed with medications - Recheck labs at next visit

## 2021-01-13 NOTE — Telephone Encounter (Signed)
Patient verbalized understanding of results  

## 2021-01-13 NOTE — Assessment & Plan Note (Signed)
-   Stable, no recurrent episodes of seizure-like activity since hospital discharge in Jan 22 - Continue Keppra for seizure prevention

## 2021-01-19 ENCOUNTER — Ambulatory Visit
Admission: RE | Admit: 2021-01-19 | Discharge: 2021-01-19 | Disposition: A | Discharge status: Home / Self Care | Source: Ambulatory Visit | Attending: Gastroenterology | Admitting: Gastroenterology

## 2021-01-19 ENCOUNTER — Other Ambulatory Visit: Payer: Self-pay

## 2021-01-19 DIAGNOSIS — K7581 Nonalcoholic steatohepatitis (NASH): Secondary | ICD-10-CM | POA: Diagnosis not present

## 2021-01-19 DIAGNOSIS — K7460 Unspecified cirrhosis of liver: Secondary | ICD-10-CM | POA: Diagnosis not present

## 2021-01-19 DIAGNOSIS — R188 Other ascites: Secondary | ICD-10-CM | POA: Diagnosis not present

## 2021-01-20 ENCOUNTER — Telehealth: Payer: Self-pay

## 2021-01-20 NOTE — Telephone Encounter (Signed)
Patient verbalized understanding  

## 2021-01-20 NOTE — Telephone Encounter (Signed)
-----   Message from Lin Landsman, MD sent at 01/20/2021  4:33 PM EDT ----- Ultrasound of the liver revealed cirrhosis only and no evidence of liver lesions  RV

## 2021-01-24 ENCOUNTER — Encounter: Payer: Self-pay | Admitting: Internal Medicine

## 2021-01-24 ENCOUNTER — Inpatient Hospital Stay (HOSPITAL_BASED_OUTPATIENT_CLINIC_OR_DEPARTMENT_OTHER): Admitting: Internal Medicine

## 2021-01-24 ENCOUNTER — Inpatient Hospital Stay: Attending: Internal Medicine

## 2021-01-24 DIAGNOSIS — Z79899 Other long term (current) drug therapy: Secondary | ICD-10-CM | POA: Insufficient documentation

## 2021-01-24 DIAGNOSIS — D696 Thrombocytopenia, unspecified: Secondary | ICD-10-CM | POA: Diagnosis not present

## 2021-01-24 DIAGNOSIS — I10 Essential (primary) hypertension: Secondary | ICD-10-CM | POA: Insufficient documentation

## 2021-01-24 DIAGNOSIS — D6959 Other secondary thrombocytopenia: Secondary | ICD-10-CM | POA: Insufficient documentation

## 2021-01-24 DIAGNOSIS — Z87891 Personal history of nicotine dependence: Secondary | ICD-10-CM | POA: Insufficient documentation

## 2021-01-24 DIAGNOSIS — E119 Type 2 diabetes mellitus without complications: Secondary | ICD-10-CM | POA: Insufficient documentation

## 2021-01-24 DIAGNOSIS — E871 Hypo-osmolality and hyponatremia: Secondary | ICD-10-CM | POA: Insufficient documentation

## 2021-01-24 DIAGNOSIS — K7460 Unspecified cirrhosis of liver: Secondary | ICD-10-CM | POA: Diagnosis not present

## 2021-01-24 DIAGNOSIS — D731 Hypersplenism: Secondary | ICD-10-CM | POA: Insufficient documentation

## 2021-01-24 DIAGNOSIS — D649 Anemia, unspecified: Secondary | ICD-10-CM | POA: Diagnosis not present

## 2021-01-24 LAB — CBC WITH DIFFERENTIAL/PLATELET
Abs Immature Granulocytes: 0.01 10*3/uL (ref 0.00–0.07)
Basophils Absolute: 0 10*3/uL (ref 0.0–0.1)
Basophils Relative: 1 %
Eosinophils Absolute: 0.1 10*3/uL (ref 0.0–0.5)
Eosinophils Relative: 3 %
HCT: 32.3 % — ABNORMAL LOW (ref 36.0–46.0)
Hemoglobin: 11.5 g/dL — ABNORMAL LOW (ref 12.0–15.0)
Immature Granulocytes: 0 %
Lymphocytes Relative: 15 %
Lymphs Abs: 0.4 10*3/uL — ABNORMAL LOW (ref 0.7–4.0)
MCH: 33.4 pg (ref 26.0–34.0)
MCHC: 35.6 g/dL (ref 30.0–36.0)
MCV: 93.9 fL (ref 80.0–100.0)
Monocytes Absolute: 0.3 10*3/uL (ref 0.1–1.0)
Monocytes Relative: 11 %
Neutro Abs: 2.1 10*3/uL (ref 1.7–7.7)
Neutrophils Relative %: 70 %
Platelets: 55 10*3/uL — ABNORMAL LOW (ref 150–400)
RBC: 3.44 MIL/uL — ABNORMAL LOW (ref 3.87–5.11)
RDW: 13.5 % (ref 11.5–15.5)
WBC: 2.9 10*3/uL — ABNORMAL LOW (ref 4.0–10.5)
nRBC: 0 % (ref 0.0–0.2)

## 2021-01-24 LAB — COMPREHENSIVE METABOLIC PANEL
ALT: 12 U/L (ref 0–44)
AST: 31 U/L (ref 15–41)
Albumin: 3.4 g/dL — ABNORMAL LOW (ref 3.5–5.0)
Alkaline Phosphatase: 66 U/L (ref 38–126)
Anion gap: 9 (ref 5–15)
BUN: 18 mg/dL (ref 8–23)
CO2: 30 mmol/L (ref 22–32)
Calcium: 9.1 mg/dL (ref 8.9–10.3)
Chloride: 87 mmol/L — ABNORMAL LOW (ref 98–111)
Creatinine, Ser: 1.01 mg/dL — ABNORMAL HIGH (ref 0.44–1.00)
GFR, Estimated: 56 mL/min — ABNORMAL LOW (ref >60)
Glucose, Bld: 108 mg/dL — ABNORMAL HIGH (ref 70–99)
Potassium: 3.9 mmol/L (ref 3.5–5.1)
Sodium: 126 mmol/L — ABNORMAL LOW (ref 135–145)
Total Bilirubin: 2.2 mg/dL — ABNORMAL HIGH (ref 0.3–1.2)
Total Protein: 6.6 g/dL (ref 6.5–8.1)

## 2021-01-24 LAB — APTT: aPTT: 30 seconds (ref 24–36)

## 2021-01-24 LAB — PROTIME-INR
INR: 1.3 — ABNORMAL HIGH (ref 0.8–1.2)
Prothrombin Time: 16.3 seconds — ABNORMAL HIGH (ref 11.4–15.2)

## 2021-01-24 NOTE — Assessment & Plan Note (Addendum)
#   Thrombocytopenia secondary to hypersplenism from splenomegaly.  Platelets-53 ; trending down over the last 5 years, but overall - STABLE.   If platelets less than 30/spontaneous bleeding-would consider thrombopoietin stimulating agents.  # mild anemia/ leucopenia- sec to cirrhosis-no infections; asymtomatic-STABLE.    # Cirrhosis-child pugh-C/recent decompensation s/p paracentesis; declined TIPs; US abdomen for [facility] surveillance-SEP 2022- ascites/ NO Liver lesions. Defer to GI re: paracentesis.  Discussed with Dr.Vanga.   # Hyponatremia- Sodium 126- sec to Cirrhosis/diuretics-STABLE; Defer to PCP/GI>   # DISPOSITION: #  follow up 6 months- MD/labs-CBC CMP PT PTT AFP; [place]-Dr.B

## 2021-01-24 NOTE — Progress Notes (Signed)
[place] NOTE  Patient Care Team: Gwyneth Sprout, FNP as PCP - General (Family Medicine) Bary Castilla, Forest Gleason, MD (General Surgery) Anell Barr, OD as Consulting Physician (Optometry) Cammie Sickle, MD as Consulting Physician (Internal Medicine) Lin Landsman, MD as Consulting Physician (Gastroenterology) Sandi Mariscal, MD as Consulting Physician (Interventional Radiology) Germaine Pomfret, [facility] as Pharmacist (Pharmacist)  CHIEF COMPLAINTS/PURPOSE OF CONSULTATION:   # THROMBOCYTOPENIA- [2015-100s-80s; 2017] N-Hb/WBC;  Hypersplenism/splenomegaly; no clumping.   #  Cirrhosis/ splenomegaly [US- 2016; stable since 2013]; Hep B/Hep C negative on 12/29/15; DECOMPENSATION of CIRRHOSIS [feb 2019; Dr.Vanga]-declined TIPS  HISTORY OF PRESENTING ILLNESS:  with sister Ria Comment; ambulating independently.   [name] 80 y.o.  female Caucasian patient history of thrombocytopenia/compensated cirrhosis/splenomegaly [based on imaging] is here for follow-up.  Patient is being actively monitored/treated by GI-with regards to her decompensated cirrhosis/recurrent ascites needing therapeutic paracentesis.  Last paracentesis was in August 2020.  She is currently on diuretics.  She had a recent ultrasound-for further evaluation of ascites awaiting a callback from GI office.  Poor appetite.  No nausea no vomiting no diarrhea.  No constipation.  Fatigue/sleeping all the time.    Patient denies any worsening swelling in the legs.  Easy bruising but no bleeding.   Review of Systems  Constitutional:  Positive for malaise/fatigue. Negative for chills, diaphoresis, fever and weight loss.  HENT:  Negative for nosebleeds and sore throat.   Eyes:  Negative for double vision.  Respiratory:  Negative for cough, hemoptysis, sputum production, shortness of breath and wheezing.   Cardiovascular:  Negative for chest pain, palpitations, orthopnea and leg swelling.   Gastrointestinal:  Negative for abdominal pain, blood in stool, constipation, diarrhea, heartburn, melena, nausea and vomiting.  Genitourinary:  Negative for dysuria, frequency and urgency.  Musculoskeletal:  Positive for myalgias and neck pain. Negative for back pain and joint pain.  Skin: Negative.  Negative for itching and rash.  Neurological:  Negative for dizziness, tingling, focal weakness, weakness and headaches.  Endo/Heme/Allergies:  Bruises/bleeds easily.  Psychiatric/Behavioral:  Negative for depression. The patient is not nervous/anxious and does not have insomnia.     MEDICAL HISTORY:  Past Medical History:  Diagnosis Date  . Acute upper GI bleed 07/12/2017  . Allergy   . Arthritis   . Asthma   . Colon polyp   . COVID-19 virus infection   . Fatty liver   . GERD (gastroesophageal reflux disease)   . Hyperlipidemia   . Hypertension   . Motion sickness    boats  . Pelvic fracture ([place]) 01/31/2018  . Upper respiratory infection 03/17/2016    SURGICAL HISTORY: Past Surgical History:  Procedure Laterality Date  . ABDOMINAL HYSTERECTOMY  1978  . COLONOSCOPY WITH PROPOFOL N/A 02/01/2015   Procedure: COLONOSCOPY WITH PROPOFOL;  Surgeon: Lucilla Lame, MD;  Location: [place];  Service: Endoscopy;  Laterality: N/A;  Diabetic - oral meds  . COLONOSCOPY WITH PROPOFOL N/A 11/22/2017   Procedure: COLONOSCOPY WITH PROPOFOL;  Surgeon: Lin Landsman, MD;  Location: [facility] ENDOSCOPY;  Service: Gastroenterology;  Laterality: N/A;  . ESOPHAGOGASTRODUODENOSCOPY (EGD) WITH PROPOFOL N/A 07/12/2017   Procedure: ESOPHAGOGASTRODUODENOSCOPY (EGD) WITH PROPOFOL;  Surgeon: Lin Landsman, MD;  Location: [facility] ENDOSCOPY;  Service: Gastroenterology;  Laterality: N/A;  . ESOPHAGOGASTRODUODENOSCOPY (EGD) WITH PROPOFOL N/A 08/16/2017   Procedure: ESOPHAGOGASTRODUODENOSCOPY (EGD) WITH PROPOFOL;  Surgeon: Lin Landsman, MD;  Location: [facility] ENDOSCOPY;  Service: Gastroenterology;   Laterality: N/A;  . ESOPHAGOGASTRODUODENOSCOPY (  EGD) WITH PROPOFOL N/A 09/20/2017   Procedure: ESOPHAGOGASTRODUODENOSCOPY (EGD) WITH PROPOFOL;  Surgeon: Lin Landsman, MD;  Location: [facility] ENDOSCOPY;  Service: Gastroenterology;  Laterality: N/A;  . ESOPHAGOGASTRODUODENOSCOPY (EGD) WITH PROPOFOL N/A 11/01/2017   Procedure: ESOPHAGOGASTRODUODENOSCOPY (EGD) WITH PROPOFOL;  Surgeon: Lin Landsman, MD;  Location: [facility] ENDOSCOPY;  Service: Gastroenterology;  Laterality: N/A;  . ESOPHAGOGASTRODUODENOSCOPY (EGD) WITH PROPOFOL N/A 01/17/2018   Procedure: ESOPHAGOGASTRODUODENOSCOPY (EGD) WITH PROPOFOL;  Surgeon: Lin Landsman, MD;  Location: [facility] ENDOSCOPY;  Service: Gastroenterology;  Laterality: N/A;  . ESOPHAGOGASTRODUODENOSCOPY (EGD) WITH PROPOFOL N/A 06/20/2018   Procedure: ESOPHAGOGASTRODUODENOSCOPY (EGD) WITH PROPOFOL;  Surgeon: Lin Landsman, MD;  Location: [facility] ENDOSCOPY;  Service: Gastroenterology;  Laterality: N/A;  EGD/ with banding ligation   . IR RADIOLOGIST EVAL & MGMT  07/09/2018  . POLYPECTOMY  02/01/2015   Procedure: POLYPECTOMY;  Surgeon: Lucilla Lame, MD;  Location: [place];  Service: Endoscopy;;  . TONSILLECTOMY AND ADENOIDECTOMY      SOCIAL HISTORY: Social History   Socioeconomic History  . Marital status: Widowed    Spouse name: Not on file  . Number of children: 2  . Years of education: Not on file  . Highest education level: Some college, no degree  Occupational History  . Occupation: retired  Tobacco Use  . Smoking status: Former    Packs/day: 0.50    Years: 6.00    Pack years: 3.00    Types: Cigarettes    Quit date: 05/08/1987    Years since quitting: 33.7  . Smokeless tobacco: Never  Vaping Use  . Vaping Use: Never used  Substance and Sexual Activity  . Alcohol use: Never  . Drug use: Never  . Sexual activity: Not on file  Other Topics Concern  . Not on file  Social History Narrative  . Not on file   Social Determinants of  Health   Financial Resource Strain: Low Risk   . Difficulty of Paying Living Expenses: Not hard at all  Food Insecurity: No Food Insecurity  . Worried About Charity fundraiser in the Last Year: Never true  . Ran Out of Food in the Last Year: Never true  Transportation Needs: No Transportation Needs  . Lack of Transportation (Medical): No  . Lack of Transportation (Non-Medical): No  Physical Activity: Not on file  Stress: Not on file  Social Connections: Not on file  Intimate Partner Violence: Not on file    FAMILY HISTORY: Family History  Problem Relation Age of Onset  . Dementia Mother   . Alcohol abuse Father   . Cancer Sister        lung  . Lymphoma Sister   . Melanoma Sister   . Lymphoma Sister     ALLERGIES:  is allergic to amoxicillin, codeine, hydrocodone-acetaminophen, nitrofurantoin, nitrofurantoin monohyd macro, penicillins, and sulfa antibiotics.  MEDICATIONS:  Current Outpatient Medications  Medication Sig Dispense Refill  . ACCU-CHEK GUIDE test strip USE TO CHECK BLOOD SUGAR DAILY 100 each 3  . albuterol (VENTOLIN HFA) 108 (90 Base) MCG/ACT inhaler INHALE 1-2 PUFFS INTO THE LUNGS EVERY 6 HOURS AS NEEDED FOR WHEEZING OR SHORTNESS OF BREATH 18 g 1  . Blood Glucose Monitoring Suppl (ACCU-CHEK AVIVA PLUS) w/Device KIT To check blood sugar daily 1 kit 0  . cholecalciferol (VITAMIN D) 25 MCG tablet Take 1 tablet (1,000 Units total) by mouth daily. 30 tablet 0  . furosemide (LASIX) 80 MG tablet TAKE ONE TABLET BY MOUTH EVERY DAY 30 tablet 1  .  lactulose (CHRONULAC) 10 GM/15ML solution Take 30 mLs (20 g total) by mouth 2 (two) times daily. 1892 mL 1  . Lancets (ACCU-CHEK SOFT TOUCH) lancets To check blood sugar daily 100 each 12  . levETIRAcetam (KEPPRA) 500 MG tablet Take 1 tablet (500 mg total) by mouth 2 (two) times daily. 60 tablet 5  . liver oil-zinc oxide (DESITIN) 40 % ointment Apply topically as needed for irritation. 56.7 g 0  . Magnesium 200 MG TABS Take 1  tablet by mouth daily.    . nadolol (CORGARD) 20 MG tablet TAKE ONE-HALF TABLET BY MOUTH EVERY DAY 45 tablet 3  . pantoprazole (PROTONIX) 40 MG tablet TAKE ONE (1) TABLET BY MOUTH TWO TIMES PER DAY 180 tablet 0  . rifaximin (XIFAXAN) 550 MG TABS tablet Take 1 tablet (550 mg total) by mouth 2 (two) times daily. 60 tablet 1  . spironolactone (ALDACTONE) 100 MG tablet TAKE 1 TABLET BY MOUTH DAILY 30 tablet 0  . albumin human 25 % bottle Inject 50 grams no matter what (Patient not taking: Reported on 01/24/2021) 50 mL 0  . albumin human 25 % bottle Inject 25 grams no matter what if 5L or more is removed another 25 grams (Patient not taking: Reported on 01/24/2021) 50 mL 0   No current facility-administered medications for this visit.      Marland Kitchen  PHYSICAL EXAMINATION:   Vitals:   01/24/21 0924  BP: (!) 107/59  Pulse: 82  Resp: 16  Temp: (!) 97 F (36.1 C)  SpO2: 100%   Filed Weights   01/24/21 0924  Weight: 150 lb (68 kg)    Physical Exam HENT:     Head: Normocephalic and atraumatic.     Mouth/Throat:     Pharynx: No oropharyngeal exudate.  Eyes:     Pupils: Pupils are equal, round, and reactive to light.  Cardiovascular:     Rate and Rhythm: Normal rate and regular rhythm.  Pulmonary:     Effort: Pulmonary effort is normal. No respiratory distress.     Breath sounds: Normal breath sounds. No wheezing.  Abdominal:     General: Bowel sounds are normal. There is distension.     Palpations: Abdomen is soft. There is no mass.     Tenderness: There is no abdominal tenderness. There is no guarding or rebound.     Comments: Positive for splenomegaly.  ascites noted   Musculoskeletal:        General: No tenderness. Normal range of motion.     Cervical back: Normal range of motion and neck supple.  Skin:    General: Skin is warm.  Neurological:     Mental Status: She is alert and oriented to person, place, and time.  Psychiatric:        Mood and Affect: Affect normal.      LABORATORY DATA:  I have reviewed the data as listed Lab Results  Component Value Date   WBC 2.9 (L) 01/24/2021   HGB 11.5 (L) 01/24/2021   HCT 32.3 (L) 01/24/2021   MCV 93.9 01/24/2021   PLT 55 (L) 01/24/2021   Recent Labs    05/22/20 1935 05/23/20 0210 07/15/20 0835 07/22/20 1146 07/26/20 1015 08/09/20 0924 12/22/20 1026 01/11/21 1450 01/24/21 0900  NA 131*  130*   < > 131*   < > 128* 131* 135 136 126*  K 3.6   < > 4.2   < > 4.0 3.8 4.3 4.0 3.9  CL 95*   < >  93*   < > 92* 96* 94* 93* 87*  CO2 26   < > 30   < > 27 28 21 29 30   GLUCOSE 171*   < > 121*   < > 205* 123* 137* 86 108*  BUN 7*   < > 10   < > 13 14 20 20 18   CREATININE 0.73   < > 1.06*   < > 0.92 0.81 1.00 1.01* 1.01*  CALCIUM 8.5*   < > 9.1   < > 9.0 8.6* 9.8 9.4 9.1  GFRNONAA >60   < > 53*   < > >60 >60  --   --  56*  PROT 5.4*   < > 6.4*  --  6.4*  --   --   --  6.6  ALBUMIN 2.7*   < > 3.2*  --  3.1*  --   --   --  3.4*  AST 38   < > 30  --  30  --   --   --  31  ALT 15   < > 15  --  15  --   --   --  12  ALKPHOS 62   < > 76  --  71  --   --   --  66  BILITOT 1.1   < > 1.9*  --  1.7*  --   --   --  2.2*  BILIDIR 0.3*  --   --   --   --   --   --   --   --   IBILI 0.8  --   --   --   --   --   --   --   --    < > = values in this interval not displayed.    RADIOGRAPHIC STUDIES: I have personally reviewed the radiological images as listed and agreed with the findings in the report. US ABDOMEN LIMITED RUQ (LIVER/GB)  Result Date: 01/20/2021 CLINICAL DATA:  Cirrhosis. EXAM: ULTRASOUND ABDOMEN LIMITED RIGHT UPPER QUADRANT COMPARISON:  Ultrasound dated 05/19/2020. FINDINGS: Gallbladder: There is sludge and possibly small stones within the gallbladder. Mild thickened appearance of the gallbladder wall, likely partially related to underdistention and also secondary to ascites and liver disease. Negative sonographic Murphy's sign. Common bile duct: Diameter: 4 mm Liver: Cirrhosis. No discrete liver lesion  identified. The main portal vein appears thrombosed. Other: Small ascites. IMPRESSION: 1. Decompensated cirrhosis with ascites and thrombosed main portal vein. 2. Gallbladder sludge and small stones. Electronically Signed   By: Anner Crete M.D.   On: 01/20/2021 01:33    ASSESSMENT & PLAN:  Thrombocytopenia ([place]) # Thrombocytopenia secondary to hypersplenism from splenomegaly.  Platelets-53 ; trending down over the last 5 years, but overall - STABLE.   If platelets less than 30/spontaneous bleeding-would consider thrombopoietin stimulating agents.  # mild anemia/ leucopenia- sec to cirrhosis-no infections; asymtomatic-STABLE.    # Cirrhosis-child pugh-C/recent decompensation s/p paracentesis; declined TIPs; US abdomen for [facility] surveillance-SEP 2022- ascites/ NO Liver lesions. Defer to GI re: paracentesis.  Discussed with Dr.Vanga.   # Hyponatremia- Sodium 126- sec to Cirrhosis/diuretics-STABLE; Defer to PCP/GI>   # DISPOSITION: #  follow up 6 months- MD/labs-CBC CMP PT PTT AFP; Ammonia-Dr.B    Cammie Sickle, MD 01/24/2021 10:44 AM

## 2021-01-24 NOTE — Progress Notes (Signed)
Patient here for follow up. No concerns today.

## 2021-01-24 NOTE — Patient Instructions (Signed)
#  Called Dr. Olen Cordial stomach doctor-to discuss regarding the results of the ultrasound/and also if fluid needs to be taken off.

## 2021-01-25 LAB — AFP TUMOR MARKER: AFP, Serum, Tumor Marker: 2.7 ng/mL (ref 0.0–8.7)

## 2021-02-15 ENCOUNTER — Other Ambulatory Visit: Payer: Self-pay | Admitting: Physician Assistant

## 2021-02-15 DIAGNOSIS — K219 Gastro-esophageal reflux disease without esophagitis: Secondary | ICD-10-CM

## 2021-02-16 ENCOUNTER — Ambulatory Visit: Payer: Medicare HMO | Admitting: Gastroenterology

## 2021-02-18 ENCOUNTER — Other Ambulatory Visit: Payer: Self-pay | Admitting: Gastroenterology

## 2021-03-14 ENCOUNTER — Telehealth: Payer: Self-pay

## 2021-03-15 ENCOUNTER — Telehealth: Payer: Medicare HMO

## 2021-03-19 ENCOUNTER — Other Ambulatory Visit: Payer: Self-pay | Admitting: Gastroenterology

## 2021-03-21 ENCOUNTER — Other Ambulatory Visit: Payer: Self-pay

## 2021-03-21 ENCOUNTER — Other Ambulatory Visit: Payer: Self-pay | Admitting: Gastroenterology

## 2021-03-21 MED ORDER — FUROSEMIDE 80 MG PO TABS: 80.0000 mg | ORAL_TABLET | Freq: Every day | ORAL | 1 refills | Status: DC

## 2021-03-21 NOTE — Progress Notes (Signed)
Sent in refill for lasix as requested

## 2021-03-21 NOTE — Progress Notes (Signed)
Sent in refill as requested for furosemide

## 2021-04-19 ENCOUNTER — Other Ambulatory Visit: Payer: Self-pay

## 2021-04-19 NOTE — Progress Notes (Signed)
Pharmacy called patient wanted the 40 mg filled not the 80 mg so took it off med list for patient

## 2021-04-30 ENCOUNTER — Other Ambulatory Visit: Payer: Self-pay | Admitting: Family Medicine

## 2021-04-30 DIAGNOSIS — K219 Gastro-esophageal reflux disease without esophagitis: Secondary | ICD-10-CM

## 2021-04-30 NOTE — Telephone Encounter (Signed)
Requested Prescriptions  Pending Prescriptions Disp Refills   pantoprazole (PROTONIX) 40 MG tablet [Pharmacy Med Name: PANTOPRAZOLE SODIUM 40 MG DR TAB] 90 tablet 1    Sig: TAKE ONE (1) TABLET BY MOUTH TWO TIMES PER DAY     Gastroenterology: Proton Pump Inhibitors Passed - 04/30/2021 12:29 PM      Passed - Valid encounter within last 12 months    Recent Outpatient Visits          3 months ago Liver cirrhosis secondary to NASH [facility])   [facility], Dionne Bucy, MD   10 months ago Liver cirrhosis secondary to NASH [facility])   [place], [place]   11 months ago Hyponatremia   [place], [place]   1 year ago Acute upper abdominal pain   [place], Vickki Muff, PA-C   1 year ago Annual physical exam   [place], [place], [place]

## 2021-05-05 ENCOUNTER — Telehealth: Payer: Self-pay | Admitting: Gastroenterology

## 2021-05-05 NOTE — Telephone Encounter (Signed)
Inbound call from pt requesting a call back stating that she is suppose to get lab work. Please advise. Thank you.

## 2021-05-10 NOTE — Telephone Encounter (Signed)
Patient is having no symptoms. She states she is drinking lemon water and has lost weight she states she weighs 149lbs. She is having no swelling

## 2021-05-10 NOTE — Telephone Encounter (Signed)
Why would she need H Pylori Breath test? Does she have any symptoms?  RV

## 2021-05-23 ENCOUNTER — Telehealth: Payer: Self-pay

## 2021-05-23 DIAGNOSIS — K7460 Unspecified cirrhosis of liver: Secondary | ICD-10-CM

## 2021-05-23 MED ORDER — ALBUMIN HUMAN 25 % IV SOLN: INTRAVENOUS | 0 refills | Status: AC

## 2021-05-23 NOTE — Telephone Encounter (Signed)
Order Paracentesis and faxed paperwork to special procedures. Called special  procedures and they will get patient schedule and give me a call back to let me know when

## 2021-05-23 NOTE — Telephone Encounter (Signed)
Patient is calling because she states she has a lot of fluid in her stomach. She states she needs to see Dr. Marius Ditch as soon as she can. She states she weighed this morning 161 pounds. Made appointment for 05/24/21 at 1:15pm

## 2021-05-23 NOTE — Telephone Encounter (Signed)
I will see her.  Please go ahead and schedule ultrasound-guided paracentesis And, order IV albumin as well  Thanks  RV

## 2021-05-23 NOTE — Telephone Encounter (Addendum)
Speical procedures called back and patient is schedule for paracentesis on 05/24/2021 [facility] 2[facility]00. Informed patient and patient verbalized understanding

## 2021-05-23 NOTE — Addendum Note (Signed)
Addended by: Ulyess Blossom L on: 05/23/2021 09:30 AM   Modules accepted: Orders

## 2021-05-24 ENCOUNTER — Ambulatory Visit (INDEPENDENT_AMBULATORY_CARE_PROVIDER_SITE_OTHER): Admitting: Gastroenterology

## 2021-05-24 ENCOUNTER — Other Ambulatory Visit: Payer: Self-pay

## 2021-05-24 ENCOUNTER — Encounter: Payer: Self-pay | Admitting: Gastroenterology

## 2021-05-24 ENCOUNTER — Ambulatory Visit
Admission: RE | Admit: 2021-05-24 | Discharge: 2021-05-24 | Disposition: A | Discharge status: Home / Self Care | Source: Ambulatory Visit | Attending: Gastroenterology | Admitting: Gastroenterology

## 2021-05-24 VITALS — BP 115/72 | HR 65 | Temp 98.1°F | Ht 65.0 in | Wt 165.4 lb

## 2021-05-24 DIAGNOSIS — K7290 Hepatic failure, unspecified without coma: Secondary | ICD-10-CM | POA: Diagnosis not present

## 2021-05-24 DIAGNOSIS — K7460 Unspecified cirrhosis of liver: Secondary | ICD-10-CM | POA: Insufficient documentation

## 2021-05-24 DIAGNOSIS — R188 Other ascites: Secondary | ICD-10-CM | POA: Diagnosis not present

## 2021-05-24 DIAGNOSIS — K7581 Nonalcoholic steatohepatitis (NASH): Secondary | ICD-10-CM

## 2021-05-24 MED ORDER — ALBUMIN HUMAN 25 % IV SOLN
INTRAVENOUS | Status: AC
  Administered 2021-05-24: 25 g via INTRAVENOUS
  Filled 2021-05-24: qty 100

## 2021-05-24 MED ORDER — ALBUMIN HUMAN 25 % IV SOLN: 25.0000 g | Freq: Once | INTRAVENOUS | Status: AC

## 2021-05-24 NOTE — Progress Notes (Signed)
Cephas Darby, MD [address]  [place]  [place], [place] [address]  Main: [phone]  Fax: [phone]    Gastroenterology Consultation  Referring Provider:     Gwyneth Sprout, FNP Primary Care Physician:  Gwyneth Sprout, FNP Primary Gastroenterologist:  Dr. Cephas Darby Reason for Consultation:   Decompensated NASH Cirrhosis        HPI:   Jennifer [name] is a 81 y.o. female referred by Dr. Gwyneth Sprout, [place]  for consultation & management of decompensated cirrhosis manifested as recurrent ascites requiring therapeutic paracentesis. She needed therapeutic paracentesis x4 since 10/2020, latest in 12/16/2020.  I increased her diuretics to Lasix 80 mg and spironolactone 100 mg daily which helped with recurrence of ascites.  Her swelling of legs had resolved and her ascites was under control.  Her weight was almost back to baseline at 148 pounds.  Patient noticed progressively worsening abdominal distention within last 2 to 3 weeks.  She weighed 149 pounds based on our telephone call at 05/10/2021.  She reports that she has been watchful of her diet and also taking Lasix and spironolactone.  She reports that she has been taking Lasix 80 mg in the morning and 40 mg in the evening along with spironolactone 100 mg daily.  She reports that she has been sleeping good.  She denies any black stools, rectal bleeding, abdominal pain, nausea or vomiting, hematemesis, fever, chills. She called my office yesterday, made an urgent visit.  She is also scheduled to undergo therapeutic paracentesis this afternoon   NSAIDs: none   Antiplts/Anticoagulants/Anti thrombotics: none   GI Procedures:   Upper endoscopy 06/20/2018 The duodenal bulb and second portion of the duodenum were normal. Moderate portal hypertensive gastropathy was found in the entire examined stomach. The cardia and gastric fundus were normal on retroflexion. Four columns of non-bleeding large (> 5 mm) varices were found in  the mid esophagus and in the distal esophagus,. No stigmata of recent bleeding were evident and no red wale signs were present. Extensive scarring from prior treatment was visible interspersed between varices, making the ligation technically challenging, therefore, I opted not to proceed with ligation. Evidence of partial eradication was visible.   EGD 01/17/18 - Normal duodenal bulb and second portion of the duodenum. - Portal hypertensive gastropathy. - A few gastric polyps.resected, Clip (MR conditional) was placed. - Large (> 5 mm) esophageal varices, banding was not performed due to device malfunction.  DIAGNOSIS:  A. STOMACH POLYPS X2, BODY; HOT SNARE:  - HYPERPLASTIC TYPE GASTRIC POLYP.  - NEGATIVE FOR H. PYLORI, DYSPLASIA, AND MALIGNANCY.   For EGD 11/01/2017 - Normal duodenal bulb and second portion of the duodenum. - Portal hypertensive gastropathy. - A few gastric polyps. - Large (> 5 mm) esophageal varices. - No specimens collected.  EGD 09/20/2017 - Normal duodenal bulb and second portion of the duodenum. - Portal hypertensive gastropathy. - Recently bleeding large (> 5 mm) esophageal varices. Incompletely eradicated. Bandedx3. - No specimens collected.  EGD 08/16/2017 - Normal duodenal bulb and second portion of the duodenum. - Portal hypertensive gastropathy. - Recently bleeding large (> 5 mm) esophageal varices. Incompletely eradicated. Banded x 4. - No specimens collected.  EGD 07/12/2017 - Normal duodenal bulb and second portion of the duodenum. - Portal hypertensive gastropathy. - Bleeding large (> 5 mm) esophageal varices. Incompletely eradicated. Banded. - No specimens collected.  Colonoscopy 04/03/2005 normal  Colonoscopy 02/01/2015 - One 8 mm polyp in the  ascending colon. Resected and retrieved. Clips were placed. - One 4 mm polyp in the transverse colon. Resected and retrieved. - One 7 mm polyp in the sigmoid colon. Resected and retrieved. Clip was  placed. - Non-bleeding internal hemorrhoids. 1. Colon, polyp(s), ascending colon polyp - SESSILE SERRATED POLYP. - NO DYSPLASIA OR MALIGNANCY. 2. Colon, polyp(s), transverse colon polyp - TUBULAR ADENOMA. - NO HIGH GRADE DYSPLASIA OR MALIGNANCY. 3. Colon, polyp(s), sigmoid colon polyp - TUBULAR ADENOMA. - NO HIGH GRADE DYSPLASIA OR MALIGNANCY.  Colonoscopy 11/22/2017 - One 5 mm polyp in the proximal ascending colon, removed with a cold biopsy forceps. Resected and retrieved. - Rectal varices. - Non-bleeding internal hemorrhoids. DIAGNOSIS:  A.  COLON POLYP X 1, ASCENDING; COLD BIOPSY:  - TUBULAR ADENOMA.  - NEGATIVE FOR HIGH-GRADE DYSPLASIA AND MALIGNANCY.   Past Medical History:  Diagnosis Date   Acute upper GI bleed 07/12/2017   Allergy    Arthritis    Asthma    Colon polyp    COVID-19 virus infection    Fatty liver    GERD (gastroesophageal reflux disease)    Hyperlipidemia    Hypertension    Motion sickness    boats   Pelvic fracture ([place]) 01/31/2018   Upper respiratory infection 03/17/2016    Past Surgical History:  Procedure Laterality Date   ABDOMINAL HYSTERECTOMY  1978   COLONOSCOPY WITH PROPOFOL N/A 02/01/2015   Procedure: COLONOSCOPY WITH PROPOFOL;  Surgeon: Lucilla Lame, MD;  Location: [facility];  Service: Endoscopy;  Laterality: N/A;  Diabetic - oral meds   COLONOSCOPY WITH PROPOFOL N/A 11/22/2017   Procedure: COLONOSCOPY WITH PROPOFOL;  Surgeon: Lin Landsman, MD;  Location: [facility] ENDOSCOPY;  Service: Gastroenterology;  Laterality: N/A;   ESOPHAGOGASTRODUODENOSCOPY (EGD) WITH PROPOFOL N/A 07/12/2017   Procedure: ESOPHAGOGASTRODUODENOSCOPY (EGD) WITH PROPOFOL;  Surgeon: Lin Landsman, MD;  Location: [facility] ENDOSCOPY;  Service: Gastroenterology;  Laterality: N/A;   ESOPHAGOGASTRODUODENOSCOPY (EGD) WITH PROPOFOL N/A 08/16/2017   Procedure: ESOPHAGOGASTRODUODENOSCOPY (EGD) WITH PROPOFOL;  Surgeon: Lin Landsman, MD;  Location: [facility] ENDOSCOPY;   Service: Gastroenterology;  Laterality: N/A;   ESOPHAGOGASTRODUODENOSCOPY (EGD) WITH PROPOFOL N/A 09/20/2017   Procedure: ESOPHAGOGASTRODUODENOSCOPY (EGD) WITH PROPOFOL;  Surgeon: Lin Landsman, MD;  Location: [facility] ENDOSCOPY;  Service: Gastroenterology;  Laterality: N/A;   ESOPHAGOGASTRODUODENOSCOPY (EGD) WITH PROPOFOL N/A 11/01/2017   Procedure: ESOPHAGOGASTRODUODENOSCOPY (EGD) WITH PROPOFOL;  Surgeon: Lin Landsman, MD;  Location: [facility] ENDOSCOPY;  Service: Gastroenterology;  Laterality: N/A;   ESOPHAGOGASTRODUODENOSCOPY (EGD) WITH PROPOFOL N/A 01/17/2018   Procedure: ESOPHAGOGASTRODUODENOSCOPY (EGD) WITH PROPOFOL;  Surgeon: Lin Landsman, MD;  Location: [facility] ENDOSCOPY;  Service: Gastroenterology;  Laterality: N/A;   ESOPHAGOGASTRODUODENOSCOPY (EGD) WITH PROPOFOL N/A 06/20/2018   Procedure: ESOPHAGOGASTRODUODENOSCOPY (EGD) WITH PROPOFOL;  Surgeon: Lin Landsman, MD;  Location: [facility];  Service: Gastroenterology;  Laterality: N/A;  EGD/ with banding ligation    IR RADIOLOGIST EVAL & MGMT  07/09/2018   POLYPECTOMY  02/01/2015   Procedure: POLYPECTOMY;  Surgeon: Lucilla Lame, MD;  Location: [facility];  Service: Endoscopy;;   TONSILLECTOMY AND ADENOIDECTOMY       Current Outpatient Medications:    ACCU-CHEK GUIDE test strip, USE TO CHECK BLOOD SUGAR DAILY, Disp: 100 each, Rfl: 3   albumin human 25 % bottle, Inject 25 grams no matter what if more then 5L are removed then another 25 grams, Disp: 50 mL, Rfl: 0   albuterol (VENTOLIN HFA) 108 (90 Base) MCG/ACT inhaler, INHALE 1-2 PUFFS INTO THE LUNGS EVERY 6 HOURS AS  NEEDED FOR WHEEZING OR SHORTNESS OF BREATH, Disp: 18 g, Rfl: 1   Blood Glucose Monitoring Suppl (ACCU-CHEK AVIVA PLUS) w/Device KIT, To check blood sugar daily, Disp: 1 kit, Rfl: 0   cholecalciferol (VITAMIN D) 25 MCG tablet, Take 1 tablet (1,000 Units total) by mouth daily., Disp: 30 tablet, Rfl: 0   furosemide (LASIX) 80 MG tablet, Take 80 mg by mouth  daily., Disp: , Rfl:    lactulose (CHRONULAC) 10 GM/15ML solution, Take 30 mLs (20 g total) by mouth 2 (two) times daily., Disp: 1892 mL, Rfl: 1   Lancets (ACCU-CHEK SOFT TOUCH) lancets, To check blood sugar daily, Disp: 100 each, Rfl: 12   levETIRAcetam (KEPPRA) 500 MG tablet, Take 1 tablet (500 mg total) by mouth 2 (two) times daily., Disp: 60 tablet, Rfl: 5   liver oil-zinc oxide (DESITIN) 40 % ointment, Apply topically as needed for irritation., Disp: 56.7 g, Rfl: 0   Magnesium 200 MG TABS, Take 1 tablet by mouth daily., Disp: , Rfl:    nadolol (CORGARD) 20 MG tablet, TAKE ONE-HALF TABLET BY MOUTH EVERY DAY, Disp: 45 tablet, Rfl: 3   nystatin (MYCOSTATIN/NYSTOP) powder, Apply topically 2 (two) times daily as needed., Disp: , Rfl:    pantoprazole (PROTONIX) 40 MG tablet, TAKE ONE (1) TABLET BY MOUTH TWO TIMES PER DAY, Disp: 90 tablet, Rfl: 1   rifaximin (XIFAXAN) 550 MG TABS tablet, Take 1 tablet (550 mg total) by mouth 2 (two) times daily., Disp: 60 tablet, Rfl: 1   spironolactone (ALDACTONE) 100 MG tablet, TAKE 1 TABLET BY MOUTH DAILY, Disp: 30 tablet, Rfl: 4   albumin human 25 % bottle, Inject 50 grams no matter what (Patient not taking: Reported on 05/24/2021), Disp: 50 mL, Rfl: 0   albumin human 25 % bottle, Inject 25 grams no matter what if 5L or more is removed another 25 grams (Patient not taking: Reported on 05/24/2021), Disp: 50 mL, Rfl: 0   Family History  Problem Relation Age of Onset   Dementia Mother    Alcohol abuse Father    Cancer Sister        lung   Lymphoma Sister    Melanoma Sister    Lymphoma Sister      Social History   Tobacco Use   Smoking status: Former    Packs/day: 0.50    Years: 6.00    Pack years: 3.00    Types: Cigarettes    Quit date: 05/08/1987    Years since quitting: 34.0   Smokeless tobacco: Never  Vaping Use   Vaping Use: Never used  Substance Use Topics   Alcohol use: Never   Drug use: Never    Allergies as of 05/24/2021 - Review  Complete 05/24/2021  Allergen Reaction Noted   Amoxicillin Other (See Comments) 07/17/2018   Codeine Other (See Comments) 10/14/2014   Hydrocodone-acetaminophen Other (See Comments) 10/14/2014   Nitrofurantoin Other (See Comments) 11/05/2014   Nitrofurantoin monohyd macro  10/14/2014   Penicillins  10/14/2014   Sulfa antibiotics Other (See Comments) 10/14/2014    Review of Systems:    All systems reviewed and negative except where noted in HPI.   Physical Exam:  BP 115/72 (BP Location: Left Arm, Patient Position: Sitting, Cuff Size: Normal)    Pulse 65    Temp 98.1 F (36.7 C) (Oral)    Ht 5' 5" (1.651 m)    Wt 165 lb 6.4 oz (75 kg)    BMI 27.52 kg/m  No LMP recorded. Patient  has had a hysterectomy.  General:   Alert,  Well-developed, well-nourished, pleasant and cooperative in NAD Head:  Normocephalic and atraumatic. Eyes:  Sclera clear, no icterus.   Conjunctiva pink. Ears:  Normal auditory acuity. Nose:  No deformity, discharge, or lesions. Mouth:  No deformity or lesions,oropharynx pink & moist. Neck:  Supple; no masses or thyromegaly. Lungs:  Respirations even and unlabored.  Clear throughout to auscultation.   No wheezes, crackles, or rhonchi. No acute distress. Heart:  Regular rate and rhythm; no murmurs, clicks, rubs, or gallops. Abdomen:  Normal bowel sounds. Soft, non-tender and severely distended, dull to percussion, no hepatosplenomegaly.  No guarding or rebound tenderness.   Rectal: Not performed Msk:  Symmetrical without gross deformities. Good, equal movement & strength bilaterally. Pulses:  Normal pulses noted. Extremities:  No clubbing, 3+ edema.  No cyanosis. Neurologic:  Alert and oriented x3;  grossly normal neurologically. Skin:  Intact without significant lesions or rashes. No jaundice. Psych:  Alert and cooperative. Normal mood and affect.  Imaging Studies: reviewed  Assessment and Plan:   Jennifer [name] is a 81 y.o. Caucasian female with metabolic  syndrome, diabetes, hypertension, hyperlipidemia seen for follow-up of decompensated cirrhosis with ascites and history of variceal bleed s/p ligation.  Decompensated cirrhosis: probably secondary to NASH in the setting of metabolic syndrome Hepatitis B and C negative, received hepatitis B vaccine in 2013. Normal ferritin levels Portal hypertension: manifested as ascites, thrombocytopenia, esophageal varices Volume overload: Patient is requiring periodic therapeutic paracentesis due to recurrence of ascites, continue Lasix 80 mg daily and spironolactone 100 mg daily, continue low-sodium diet.  She refused TIPS Recheck BMP today 2D echo revealed grade 1 diastolic dysfunction Recommend therapeutic paracentesis with administration of 50 g of albumin Esophageal varices: status post banding on 07/12/2017. EGD in 08/2017, ligation performed.  EGD in 10/2017, ligation was not performed due to extensive scarring from prior banding. Continue nadolol, patient is currently taking 10 mg daily, Attempted variceal banding in 01/2018, unsuccessful due to device malfunction. Repeat EGD in 06/2018, banding was not performed due to extensive scarring.  She was referred to IR for TIPS placement and she was deemed to be a candidate for it however, patient decided not to proceed with it and she is persistent about this decision.   Anemia/thrombocytopenia/coagulopathy: No evidence of anemia,does not have B12 or iron deficiency, mild thrombocytopenia and stable PSE: on rifaximin HCC surveillance: Up-to-date, no evidence of liver lesions based on imaging HRS: None She is immune to hepatitis A and B, received vaccinations in 2013 Received Pneumovax and prevnar  History of tubular adenomas:  Repeat surveillance colonoscopy 11/2022 based on her overall medical condition at that time   Follow up in 1 month   Cephas Darby, MD

## 2021-05-24 NOTE — Procedures (Signed)
PROCEDURE SUMMARY:  Successful US guided paracentesis from RLQ.  Yielded 8.4 L of clear yellow fluid.  No immediate complications.  Pt tolerated well.   Specimen was not sent for labs.  EBL < 74m  MRockney Ghee1/17/2023 4:15 PM

## 2021-05-25 LAB — CBC
Hematocrit: 32 % — ABNORMAL LOW (ref 34.0–46.6)
Hemoglobin: 11 g/dL — ABNORMAL LOW (ref 11.1–15.9)
MCH: 32.4 pg (ref 26.6–33.0)
MCHC: 34.4 g/dL (ref 31.5–35.7)
MCV: 94 fL (ref 79–97)
Platelets: 72 10*3/uL — CL (ref 150–450)
RBC: 3.4 x10E6/uL — ABNORMAL LOW (ref 3.77–5.28)
RDW: 13.2 % (ref 11.7–15.4)
WBC: 3.5 10*3/uL (ref 3.4–10.8)

## 2021-05-25 LAB — COMPREHENSIVE METABOLIC PANEL
ALT: 12 IU/L (ref 0–32)
AST: 28 IU/L (ref 0–40)
Albumin/Globulin Ratio: 1.1 — ABNORMAL LOW (ref 1.2–2.2)
Albumin: 3.2 g/dL — ABNORMAL LOW (ref 3.6–4.6)
Alkaline Phosphatase: 90 IU/L (ref 44–121)
BUN/Creatinine Ratio: 12 (ref 12–28)
BUN: 12 mg/dL (ref 8–27)
Bilirubin Total: 1.2 mg/dL (ref 0.0–1.2)
CO2: 26 mmol/L (ref 20–29)
Calcium: 9 mg/dL (ref 8.7–10.3)
Chloride: 92 mmol/L — ABNORMAL LOW (ref 96–106)
Creatinine, Ser: 1.02 mg/dL — ABNORMAL HIGH (ref 0.57–1.00)
Globulin, Total: 2.8 g/dL (ref 1.5–4.5)
Glucose: 120 mg/dL — ABNORMAL HIGH (ref 70–99)
Potassium: 3.6 mmol/L (ref 3.5–5.2)
Sodium: 131 mmol/L — ABNORMAL LOW (ref 134–144)
Total Protein: 6 g/dL (ref 6.0–8.5)
eGFR: 55 mL/min/{1.73_m2} — ABNORMAL LOW (ref >59)

## 2021-05-25 LAB — PROTIME-INR
INR: 1.2 (ref 0.9–1.2)
Prothrombin Time: 12.7 s — ABNORMAL HIGH (ref 9.1–12.0)

## 2021-05-26 ENCOUNTER — Telehealth: Payer: Self-pay

## 2021-05-26 NOTE — Telephone Encounter (Signed)
-----   Message from Lin Landsman, MD sent at 05/26/2021  2:21 PM EST ----- Caryl Pina  I was waiting for the lab results. Please tell pt to take only 2m lasix in the morning and aldactone 1549mdaily  Recheck BMP in 1 week  RV ----- Message ----- From: FeGlennie IsleCMA Sent: 05/26/2021   9:01 AM EST To: RoLin LandsmanMD  Pt's home health nurse, Rusty called to confirm with you about how Mrs. GiFerrentinos suppose to be taking her Lasix and Spironolactone.   Per your note from 05/24/21: continue Lasix 80 mg daily and spironolactone 100 mg daily  Per the nurse: Pt was taking Lasix 8024mn the morning and an additional 50m56m the evening. Do you want her to stop the nightly 50mg76mix?   Please review and advise.   Ginger  Rusty: [phone]

## 2021-05-26 NOTE — Telephone Encounter (Signed)
Patient verbalized understanding of instructions  

## 2021-06-10 ENCOUNTER — Telehealth: Payer: Self-pay | Admitting: Gastroenterology

## 2021-06-14 ENCOUNTER — Ambulatory Visit (INDEPENDENT_AMBULATORY_CARE_PROVIDER_SITE_OTHER)

## 2021-06-14 DIAGNOSIS — Z0000 Encounter for general adult medical examination without abnormal findings: Secondary | ICD-10-CM | POA: Diagnosis not present

## 2021-06-14 NOTE — Patient Instructions (Signed)
[name] , Thank you for taking time to come for your Medicare Wellness Visit. I appreciate your ongoing commitment to your health goals. Please review the following plan we discussed and let me know if I can assist you in the future.   Screening recommendations/referrals: Colonoscopy: aged out Mammogram: aged out Bone Density: 01/27/15 Recommended yearly ophthalmology/optometry visit for glaucoma screening and checkup Recommended yearly dental visit for hygiene and checkup  Vaccinations: Influenza vaccine: 01/22/19 Pneumococcal vaccine: 07/13/14 Tdap vaccine: 06/17/18 Shingles vaccine: n/d   Covid-19:07/03/19, 07/31/19, 03/16/20  Advanced directives: no  Conditions/risks identified: none  Next appointment: Follow up in one year for your annual wellness visit    Preventive Care 33 Years and Older, Female Preventive care refers to lifestyle choices and visits with your health care provider that can promote health and wellness. What does preventive care include? A yearly physical exam. This is also called an annual well check. Dental exams once or twice a year. Routine eye exams. Ask your health care provider how often you should have your eyes checked. Personal lifestyle choices, including: Daily care of your teeth and gums. Regular physical activity. Eating a healthy diet. Avoiding tobacco and drug use. Limiting alcohol use. Practicing safe sex. Taking low-dose aspirin every day. Taking vitamin and mineral supplements as recommended by your health care provider. What happens during an annual well check? The services and screenings done by your health care provider during your annual well check will depend on your age, overall health, lifestyle risk factors, and family history of disease. Counseling  Your health care provider may ask you questions about your: Alcohol use. Tobacco use. Drug use. Emotional well-being. Home and relationship well-being. Sexual activity. Eating  habits. History of falls. Memory and ability to understand (cognition). Work and work Statistician. Reproductive health. Screening  You may have the following tests or measurements: Height, weight, and BMI. Blood pressure. Lipid and cholesterol levels. These may be checked every 5 years, or more frequently if you are over 4 years old. Skin check. Lung cancer screening. You may have this screening every year starting at age 60 if you have a 30-pack-year history of smoking and currently smoke or have quit within the past 15 years. Fecal occult blood test (FOBT) of the stool. You may have this test every year starting at age 80. Flexible sigmoidoscopy or colonoscopy. You may have a sigmoidoscopy every 5 years or a colonoscopy every 10 years starting at age 31. Hepatitis C blood test. Hepatitis B blood test. Sexually transmitted disease (STD) testing. Diabetes screening. This is done by checking your blood sugar (glucose) after you have not eaten for a while (fasting). You may have this done every 1-3 years. Bone density scan. This is done to screen for osteoporosis. You may have this done starting at age 66. Mammogram. This may be done every 1-2 years. Talk to your health care provider about how often you should have regular mammograms. Talk with your health care provider about your test results, treatment options, and if necessary, the need for more tests. Vaccines  Your health care provider may recommend certain vaccines, such as: Influenza vaccine. This is recommended every year. Tetanus, diphtheria, and acellular pertussis (Tdap, Td) vaccine. You may need a Td booster every 10 years. Zoster vaccine. You may need this after age 43. Pneumococcal 13-valent conjugate (PCV13) vaccine. One dose is recommended after age 47. Pneumococcal polysaccharide (PPSV23) vaccine. One dose is recommended after age 26. Talk to your health care provider about which  screenings and vaccines you need and how  often you need them. This information is not intended to replace advice given to you by your health care provider. Make sure you discuss any questions you have with your health care provider. Document Released: 05/21/2015 Document Revised: 01/12/2016 Document Reviewed: 02/23/2015 Elsevier Interactive Patient Education  2017 [place] Prevention in the Home Falls can cause injuries. They can happen to people of all ages. There are many things you can do to make your home safe and to help prevent falls. What can I do on the outside of my home? Regularly fix the edges of walkways and driveways and fix any cracks. Remove anything that might make you trip as you walk through a door, such as a raised step or threshold. Trim any bushes or trees on the path to your home. Use bright outdoor lighting. Clear any walking paths of anything that might make someone trip, such as rocks or tools. Regularly check to see if handrails are loose or broken. Make sure that both sides of any steps have handrails. Any raised decks and porches should have guardrails on the edges. Have any leaves, snow, or ice cleared regularly. Use sand or salt on walking paths during winter. Clean up any spills in your garage right away. This includes oil or grease spills. What can I do in the bathroom? Use night lights. Install grab bars by the toilet and in the tub and shower. Do not use towel bars as grab bars. Use non-skid mats or decals in the tub or shower. If you need to sit down in the shower, use a plastic, non-slip stool. Keep the floor dry. Clean up any water that spills on the floor as soon as it happens. Remove soap buildup in the tub or shower regularly. Attach bath mats securely with double-sided non-slip rug tape. Do not have throw rugs and other things on the floor that can make you trip. What can I do in the bedroom? Use night lights. Make sure that you have a light by your bed that is easy to  reach. Do not use any sheets or blankets that are too big for your bed. They should not hang down onto the floor. Have a firm chair that has side arms. You can use this for support while you get dressed. Do not have throw rugs and other things on the floor that can make you trip. What can I do in the kitchen? Clean up any spills right away. Avoid walking on wet floors. Keep items that you use a lot in easy-to-reach places. If you need to reach something above you, use a strong step stool that has a grab bar. Keep electrical cords out of the way. Do not use floor polish or wax that makes floors slippery. If you must use wax, use non-skid floor wax. Do not have throw rugs and other things on the floor that can make you trip. What can I do with my stairs? Do not leave any items on the stairs. Make sure that there are handrails on both sides of the stairs and use them. Fix handrails that are broken or loose. Make sure that handrails are as long as the stairways. Check any carpeting to make sure that it is firmly attached to the stairs. Fix any carpet that is loose or worn. Avoid having throw rugs at the top or bottom of the stairs. If you do have throw rugs, attach them to the floor with carpet  tape. Make sure that you have a light switch at the top of the stairs and the bottom of the stairs. If you do not have them, ask someone to add them for you. What else can I do to help prevent falls? Wear shoes that: Do not have high heels. Have rubber bottoms. Are comfortable and fit you well. Are closed at the toe. Do not wear sandals. If you use a stepladder: Make sure that it is fully opened. Do not climb a closed stepladder. Make sure that both sides of the stepladder are locked into place. Ask someone to hold it for you, if possible. Clearly mark and make sure that you can see: Any grab bars or handrails. First and last steps. Where the edge of each step is. Use tools that help you move  around (mobility aids) if they are needed. These include: Canes. Walkers. Scooters. Crutches. Turn on the lights when you go into a dark area. Replace any light bulbs as soon as they burn out. Set up your furniture so you have a clear path. Avoid moving your furniture around. If any of your floors are uneven, fix them. If there are any pets around you, be aware of where they are. Review your medicines with your doctor. Some medicines can make you feel dizzy. This can increase your chance of falling. Ask your doctor what other things that you can do to help prevent falls. This information is not intended to replace advice given to you by your health care provider. Make sure you discuss any questions you have with your health care provider. Document Released: 02/18/2009 Document Revised: 09/30/2015 Document Reviewed: 05/29/2014 Elsevier Interactive Patient Education  2017 [employer].

## 2021-06-14 NOTE — Progress Notes (Signed)
Virtual Visit via Telephone Note  I connected with  Jennifer [name] on 06/14/21 at  2:20 PM EST by telephone and verified that I am speaking with the correct person using two identifiers.  Location: Patient: home Provider: [facility] Persons participating in the virtual visit: [place]   I discussed the limitations, risks, security and privacy concerns of performing an evaluation and management service by telephone and the availability of in person appointments. The patient expressed understanding and agreed to proceed.  Interactive audio and video telecommunications were attempted between this nurse and patient, however failed, due to patient having technical difficulties OR patient did not have access to video capability.  We continued and completed visit with audio only.  Some vital signs may be absent or patient reported.   Dionisio David, LPN  Subjective:   Jennifer [name] is a 81 y.o. female who presents for Medicare Annual (Subsequent) preventive examination.  Review of Systems           Objective:    There were no vitals filed for this visit. There is no height or weight on file to calculate BMI.  Advanced Directives 01/24/2021 07/26/2020 06/03/2020 06/02/2020 05/26/2020 05/22/2020 05/19/2020  Does Patient Have a Medical Advance Directive? No Yes No Yes Yes Yes Yes  Type of Advance Directive - [place];Living will - - [place];Living will [place];Living will [place];Living will  Does patient want to make changes to medical advance directive? No - Patient declined No - Patient declined No - Patient declined - No - Patient declined - -  Copy of [place] in Chart? - No - copy requested - - No - copy requested No - copy requested -  Would patient like information on creating a medical advance directive? No - Patient declined No - Patient declined - - - - -    Current  Medications (verified) Outpatient Encounter Medications as of 06/14/2021  Medication Sig   ACCU-CHEK GUIDE test strip USE TO CHECK BLOOD SUGAR DAILY   albumin human 25 % bottle Inject 50 grams no matter what (Patient not taking: Reported on 05/24/2021)   albumin human 25 % bottle Inject 25 grams no matter what if 5L or more is removed another 25 grams (Patient not taking: Reported on 05/24/2021)   albumin human 25 % bottle Inject 25 grams no matter what if more then 5L are removed then another 25 grams   albuterol (VENTOLIN HFA) 108 (90 Base) MCG/ACT inhaler INHALE 1-2 PUFFS INTO THE LUNGS EVERY 6 HOURS AS NEEDED FOR WHEEZING OR SHORTNESS OF BREATH   Blood Glucose Monitoring Suppl (ACCU-CHEK AVIVA PLUS) w/Device KIT To check blood sugar daily   cholecalciferol (VITAMIN D) 25 MCG tablet Take 1 tablet (1,000 Units total) by mouth daily.   furosemide (LASIX) 40 MG tablet Take 40 mg by mouth daily.   furosemide (LASIX) 80 MG tablet Take 80 mg by mouth daily.   lactulose (CHRONULAC) 10 GM/15ML solution Take 30 mLs (20 g total) by mouth 2 (two) times daily.   Lancets (ACCU-CHEK SOFT TOUCH) lancets To check blood sugar daily   levETIRAcetam (KEPPRA) 500 MG tablet Take 1 tablet (500 mg total) by mouth 2 (two) times daily.   liver oil-zinc oxide (DESITIN) 40 % ointment Apply topically as needed for irritation.   Magnesium 200 MG TABS Take 1 tablet by mouth daily.   nadolol (CORGARD) 20 MG tablet TAKE ONE-HALF TABLET  BY MOUTH EVERY DAY   nystatin (MYCOSTATIN/NYSTOP) powder Apply topically 2 (two) times daily as needed.   pantoprazole (PROTONIX) 40 MG tablet TAKE ONE (1) TABLET BY MOUTH TWO TIMES PER DAY   rifaximin (XIFAXAN) 550 MG TABS tablet Take 1 tablet (550 mg total) by mouth 2 (two) times daily.   spironolactone (ALDACTONE) 100 MG tablet TAKE 1 TABLET BY MOUTH DAILY   No facility-administered encounter medications on file as of 06/14/2021.    Allergies (verified) Amoxicillin, Codeine,  Hydrocodone-acetaminophen, Nitrofurantoin, Nitrofurantoin monohyd macro, Penicillins, and Sulfa antibiotics   History: Past Medical History:  Diagnosis Date   Acute upper GI bleed 07/12/2017   Allergy    Arthritis    Asthma    Colon polyp    COVID-19 virus infection    Fatty liver    GERD (gastroesophageal reflux disease)    Hyperlipidemia    Hypertension    Motion sickness    boats   Pelvic fracture ([place]) 01/31/2018   Upper respiratory infection 03/17/2016   Past Surgical History:  Procedure Laterality Date   ABDOMINAL HYSTERECTOMY  1978   COLONOSCOPY WITH PROPOFOL N/A 02/01/2015   Procedure: COLONOSCOPY WITH PROPOFOL;  Surgeon: Lucilla Lame, MD;  Location: [place];  Service: Endoscopy;  Laterality: N/A;  Diabetic - oral meds   COLONOSCOPY WITH PROPOFOL N/A 11/22/2017   Procedure: COLONOSCOPY WITH PROPOFOL;  Surgeon: Lin Landsman, MD;  Location: [facility] ENDOSCOPY;  Service: Gastroenterology;  Laterality: N/A;   ESOPHAGOGASTRODUODENOSCOPY (EGD) WITH PROPOFOL N/A 07/12/2017   Procedure: ESOPHAGOGASTRODUODENOSCOPY (EGD) WITH PROPOFOL;  Surgeon: Lin Landsman, MD;  Location[facility] ENDOSCOPY;  Service: Gastroenterology;  Laterality: N/A;   ESOPHAGOGASTRODUODENOSCOPY (EGD) WITH PROPOFOL N/A 08/16/2017   Procedure: ESOPHAGOGASTRODUODENOSCOPY (EGD) WITH PROPOFOL;  Surgeon: Lin Landsman, MD;  Location: [facility] ENDOSCOPY;  Service: Gastroenterology;  Laterality: N/A;   ESOPHAGOGASTRODUODENOSCOPY (EGD) WITH PROPOFOL N/A 09/20/2017   Procedure: ESOPHAGOGASTRODUODENOSCOPY (EGD) WITH PROPOFOL;  Surgeon: Lin Landsman, MD;  Location: [facility] ENDOSCOPY;  Service: Gastroenterology;  Laterality: N/A;   ESOPHAGOGASTRODUODENOSCOPY (EGD) WITH PROPOFOL N/A 11/01/2017   Procedure: ESOPHAGOGASTRODUODENOSCOPY (EGD) WITH PROPOFOL;  Surgeon: Lin Landsman, MD;  Location: [facility] ENDOSCOPY;  Service: Gastroenterology;  Laterality: N/A;   ESOPHAGOGASTRODUODENOSCOPY (EGD) WITH PROPOFOL N/A  01/17/2018   Procedure: ESOPHAGOGASTRODUODENOSCOPY (EGD) WITH PROPOFOL;  Surgeon: Lin Landsman, MD;  Location: [facility] ENDOSCOPY;  Service: Gastroenterology;  Laterality: N/A;   ESOPHAGOGASTRODUODENOSCOPY (EGD) WITH PROPOFOL N/A 06/20/2018   Procedure: ESOPHAGOGASTRODUODENOSCOPY (EGD) WITH PROPOFOL;  Surgeon: Lin Landsman, MD;  Location: [place];  Service: Gastroenterology;  Laterality: N/A;  EGD/ with banding ligation    IR RADIOLOGIST EVAL & MGMT  07/09/2018   POLYPECTOMY  02/01/2015   Procedure: POLYPECTOMY;  Surgeon: Lucilla Lame, MD;  Location: [place];  Service: Endoscopy;;   TONSILLECTOMY AND ADENOIDECTOMY     Family History  Problem Relation Age of Onset   Dementia Mother    Alcohol abuse Father    Cancer Sister        lung   Lymphoma Sister    Melanoma Sister    Lymphoma Sister    Social History   Socioeconomic History   Marital status: Widowed    Spouse name: Not on file   Number of children: 2   Years of education: Not on file   Highest education level: Some college, no degree  Occupational History   Occupation: retired  Tobacco Use   Smoking status: Former    Packs/day: 0.50    Years: 6.00  Pack years: 3.00    Types: Cigarettes    Quit date: 05/08/1987    Years since quitting: 34.1   Smokeless tobacco: Never  Vaping Use   Vaping Use: Never used  Substance and Sexual Activity   Alcohol use: Never   Drug use: Never   Sexual activity: Not on file  Other Topics Concern   Not on file  Social History Narrative   Not on file   Social Determinants of Health   Financial Resource Strain: Not on file  Food Insecurity: Not on file  Transportation Needs: Not on file  Physical Activity: Not on file  Stress: Not on file  Social Connections: Not on file    Tobacco Counseling Counseling given: Not Answered   Clinical Intake:  Pre-visit preparation completed: Yes  Pain : No/denies pain     Nutritional Risks: None Diabetes:  No CBG done?: No Did pt. bring in CBG monitor from home?: No  How often do you need to have someone help you when you read instructions, pamphlets, or other written materials from your doctor or pharmacy?: 1 - Never  Diabetic?no  Interpreter Needed?: No  Information entered by :: Kirke Shaggy, LPN   Activities of Daily Living No flowsheet data found.  Patient Care Team: Gwyneth Sprout, FNP as PCP - General (Family Medicine) Bary Castilla, Forest Gleason, MD (General Surgery) Anell Barr, OD as Consulting Physician (Optometry) Cammie Sickle, MD as Consulting Physician (Internal Medicine) Lin Landsman, MD as Consulting Physician (Gastroenterology) Sandi Mariscal, MD as Consulting Physician (Interventional Radiology) Germaine Pomfret, [facility] as Pharmacist (Pharmacist)  Indicate any recent Medical Services you may have received from other than [facility] providers in the past year (date may be approximate).     Assessment:   This is a routine wellness examination for [place].  Hearing/Vision screen No results found.  Dietary issues and exercise activities discussed:     Goals Addressed   None    Depression Screen PHQ 2/9 Scores 06/01/2020 09/17/2019 09/11/2018 09/11/2018 08/23/2017 08/23/2017 07/27/2016  PHQ - 2 Score 1 0 0 0 0 0 0  PHQ- 9 Score - - 1 - 4 - 0    Fall Risk Fall Risk  06/01/2020 09/17/2019 09/11/2018 08/23/2017 07/27/2016  Falls in the past year? (No Data) 0 1 No No  Comment Patient does not recall falling in the last year. - - - -  Number falls in past yr: - 0 0 - -  Injury with Fall? - 0 1 - -  Comment - - Broke pelvis in 09/19. - -  Risk for fall due to : Medication side effect;Impaired balance/gait - - - -  Follow up Falls prevention discussed - Falls prevention discussed - -    FALL RISK PREVENTION PERTAINING TO THE HOME:  Any stairs in or around the home? Yes  If so, are there any without handrails? No  Home free of loose throw rugs in walkways, pet beds,  electrical cords, etc? Yes  Adequate lighting in your home to reduce risk of falls? Yes   ASSISTIVE DEVICES UTILIZED TO PREVENT FALLS:  Life alert? No  Use of a cane, walker or w/c? No  Grab bars in the bathroom? Yes  Shower chair or bench in shower? Yes  Elevated toilet seat or a handicapped toilet? Yes   Cognitive Function:    6CIT Screen 09/17/2019 09/11/2018 07/27/2016  What Year? 0 points 0 points 0 points  What month? 0 points 0 points 0  points  What time? 0 points 0 points 0 points  Count back from 20 0 points 0 points 0 points  Months in reverse 0 points 0 points 0 points  Repeat phrase 0 points 0 points 4 points  Total Score 0 0 4    Immunizations Immunization History  Administered Date(s) Administered   Hepatitis A 07/10/2011, 12/25/2011   Hepatitis B 07/10/2011, 08/11/2011, 12/25/2011   Influenza Split 02/24/2010, 03/27/2012   Influenza, High Dose Seasonal PF 01/18/2015, 02/19/2016, 02/05/2017, 03/04/2018, 01/22/2019   Influenza-Unspecified 01/22/2019   Moderna Sars-Covid-2 Vaccination 07/03/2019, 07/31/2019, 03/16/2020   Pneumococcal Conjugate-13 07/13/2014   Pneumococcal Polysaccharide-23 01/27/2009   Td 04/08/1996   Tdap 02/24/2010, 06/17/2018    TDAP status: Up to date  Flu Vaccine status: Declined, Education has been provided regarding the importance of this vaccine but patient still declined. Advised may receive this vaccine at local pharmacy or Health Dept. Aware to provide a copy of the vaccination record if obtained from local pharmacy or Health Dept. Verbalized acceptance and understanding.  Pneumococcal vaccine status: Up to date  Covid-19 vaccine status: Completed vaccines  Qualifies for Shingles Vaccine? No   Zostavax completed No   Shingrix Completed?: No.    Education has been provided regarding the importance of this vaccine. Patient has been advised to call insurance company to determine out of pocket expense if they have not yet received this  vaccine. Advised may also receive vaccine at local pharmacy or Health Dept. Verbalized acceptance and understanding.  Screening Tests Health Maintenance  Topic Date Due   Zoster Vaccines- Shingrix (1 of 2) Never done   URINE MICROALBUMIN  08/31/2018   DEXA SCAN  01/27/2020   COVID-19 Vaccine (4 - Booster for Moderna series) 05/11/2020   HEMOGLOBIN A1C  11/16/2020   FOOT EXAM  12/04/2020   INFLUENZA VACCINE  12/06/2020   OPHTHALMOLOGY EXAM  12/14/2020   COLONOSCOPY (Pts 45-78yr Insurance coverage will need to be confirmed)  11/23/2022   TETANUS/TDAP  06/17/2028   Pneumonia Vaccine 81 Years old  Completed   HPV VACCINES  Aged Out    Health Maintenance  Health Maintenance Due  Topic Date Due   Zoster Vaccines- Shingrix (1 of 2) Never done   URINE MICROALBUMIN  08/31/2018   DEXA SCAN  01/27/2020   COVID-19 Vaccine (4 - Booster for Moderna series) 05/11/2020   HEMOGLOBIN A1C  11/16/2020   FOOT EXAM  12/04/2020   INFLUENZA VACCINE  12/06/2020   OPHTHALMOLOGY EXAM  12/14/2020    Colorectal cancer screening: No longer required.   Mammogram status: No longer required due to age.  Bone Density status: Completed 01/27/15. Results reflect: Bone density results: NORMAL. Repeat every 5 years.- declined referral  Lung Cancer Screening: (Low Dose CT Chest recommended if Age 522-80years, 30 pack-year currently smoking OR have quit w/in 15years.) does not qualify.    Additional Screening:  Hepatitis C Screening: does not qualify; Completed no  Vision Screening: Recommended annual ophthalmology exams for early detection of glaucoma and other disorders of the eye. Is the patient up to date with their annual eye exam?  Yes  Who is the provider or what is the name of the office in which the patient attends annual eye exams? Dr.Woodard If pt is not established with a provider, would they like to be referred to a provider to establish care? No .   Dental Screening: Recommended annual  dental exams for proper oral hygiene  Community Resource Referral / Chronic Care Management:  CRR required this visit?  No   CCM required this visit?  No      Plan:     I have personally reviewed and noted the following in the patients chart:   Medical and social history Use of alcohol, tobacco or illicit drugs  Current medications and supplements including opioid prescriptions.  Functional ability and status Nutritional status Physical activity Advanced directives List of other physicians Hospitalizations, surgeries, and ER visits in previous 12 months Vitals Screenings to include cognitive, depression, and falls Referrals and appointments  In addition, I have reviewed and discussed with patient certain preventive protocols, quality metrics, and best practice recommendations. A written personalized care plan for preventive services as well as general preventive health recommendations were provided to patient.     Dionisio David, LPN   01/13/2819   Nurse Notes: none

## 2021-06-22 ENCOUNTER — Ambulatory Visit (INDEPENDENT_AMBULATORY_CARE_PROVIDER_SITE_OTHER): Admitting: Gastroenterology

## 2021-06-22 ENCOUNTER — Other Ambulatory Visit: Payer: Self-pay

## 2021-06-22 ENCOUNTER — Encounter: Payer: Self-pay | Admitting: Gastroenterology

## 2021-06-22 ENCOUNTER — Other Ambulatory Visit: Payer: Self-pay | Admitting: Gastroenterology

## 2021-06-22 VITALS — BP 109/57 | HR 61 | Temp 97.7°F | Ht 65.0 in | Wt 163.1 lb

## 2021-06-22 DIAGNOSIS — K7290 Hepatic failure, unspecified without coma: Secondary | ICD-10-CM

## 2021-06-22 DIAGNOSIS — K7581 Nonalcoholic steatohepatitis (NASH): Secondary | ICD-10-CM | POA: Diagnosis not present

## 2021-06-22 DIAGNOSIS — R188 Other ascites: Secondary | ICD-10-CM

## 2021-06-22 DIAGNOSIS — K7460 Unspecified cirrhosis of liver: Secondary | ICD-10-CM | POA: Diagnosis not present

## 2021-06-22 MED ORDER — ALBUMIN HUMAN 25 % IV SOLN: INTRAVENOUS | 0 refills | Status: AC

## 2021-06-22 NOTE — Progress Notes (Signed)
Jennifer Darby, MD [address]  [place]  [place], [place] [address]  Main: [phone]  Fax: [phone]    Gastroenterology Consultation  Referring Provider:     Gwyneth Sprout, FNP Primary Care Physician:  Gwyneth Sprout, FNP Primary Gastroenterologist:  Dr. Cephas [name] Reason for Consultation:   Decompensated NASH Cirrhosis        HPI:   Jennifer [name] is a 81 y.o. female referred by Dr. Gwyneth Sprout, [place]  for consultation & management of decompensated cirrhosis manifested as recurrent ascites requiring therapeutic paracentesis. She needed therapeutic paracentesis x4 since 10/2020, latest in 12/16/2020.  I increased her diuretics to Lasix 80 mg and spironolactone 100 mg daily which helped with recurrence of ascites.  Her swelling of legs had resolved and her ascites was under control.  Her weight was almost back to baseline at 148 pounds.  Patient noticed progressively worsening abdominal distention within last 2 to 3 weeks.  She weighed 149 pounds based on our telephone call at 05/10/2021.  She reports that she has been watchful of her diet and also taking Lasix and spironolactone.  She reports that she has been taking Lasix 80 mg in the morning and 40 mg in the evening along with spironolactone 100 mg daily.  She reports that she has been sleeping good.  She denies any black stools, rectal bleeding, abdominal pain, nausea or vomiting, hematemesis, fever, chills. She called my office yesterday, made an urgent visit.  She is also scheduled to undergo therapeutic paracentesis this afternoon  Follow-up visit 06/22/2021 Patient is here for follow-up of ascites.  Patient has recurrence of ascites with abdominal distention.  She underwent therapeutic paracentesis on 05/24/2021, 8.4 L of clear yellow fluid was removed and she received albumin.  She continues to take Lasix 80 mg daily and spironolactone 150 mg daily.  Patient has reaccumulation of fluid.  Patient is not taking  rifaximin.  She denies any symptoms of hepatic encephalopathy.   NSAIDs: none   Antiplts/Anticoagulants/Anti thrombotics: none   GI Procedures:   Upper endoscopy 06/20/2018 The duodenal bulb and second portion of the duodenum were normal. Moderate portal hypertensive gastropathy was found in the entire examined stomach. The cardia and gastric fundus were normal on retroflexion. Four columns of non-bleeding large (> 5 mm) varices were found in the mid esophagus and in the distal esophagus,. No stigmata of recent bleeding were evident and no red wale signs were present. Extensive scarring from prior treatment was visible interspersed between varices, making the ligation technically challenging, therefore, I opted not to proceed with ligation. Evidence of partial eradication was visible.   EGD 01/17/18 - Normal duodenal bulb and second portion of the duodenum. - Portal hypertensive gastropathy. - A few gastric polyps.resected, Clip (MR conditional) was placed. - Large (> 5 mm) esophageal varices, banding was not performed due to device malfunction.  DIAGNOSIS:  A. STOMACH POLYPS X2, BODY; HOT SNARE:  - HYPERPLASTIC TYPE GASTRIC POLYP.  - NEGATIVE FOR H. PYLORI, DYSPLASIA, AND MALIGNANCY.   For EGD 11/01/2017 - Normal duodenal bulb and second portion of the duodenum. - Portal hypertensive gastropathy. - A few gastric polyps. - Large (> 5 mm) esophageal varices. - No specimens collected.  EGD 09/20/2017 - Normal duodenal bulb and second portion of the duodenum. - Portal hypertensive gastropathy. - Recently bleeding large (> 5 mm) esophageal varices. Incompletely eradicated. Bandedx3. - No specimens collected.  EGD 08/16/2017 - Normal duodenal bulb and  second portion of the duodenum. - Portal hypertensive gastropathy. - Recently bleeding large (> 5 mm) esophageal varices. Incompletely eradicated. Banded x 4. - No specimens collected.  EGD 07/12/2017 - Normal duodenal bulb and  second portion of the duodenum. - Portal hypertensive gastropathy. - Bleeding large (> 5 mm) esophageal varices. Incompletely eradicated. Banded. - No specimens collected.  Colonoscopy 04/03/2005 normal  Colonoscopy 02/01/2015 - One 8 mm polyp in the ascending colon. Resected and retrieved. Clips were placed. - One 4 mm polyp in the transverse colon. Resected and retrieved. - One 7 mm polyp in the sigmoid colon. Resected and retrieved. Clip was placed. - Non-bleeding internal hemorrhoids. 1. Colon, polyp(s), ascending colon polyp - SESSILE SERRATED POLYP. - NO DYSPLASIA OR MALIGNANCY. 2. Colon, polyp(s), transverse colon polyp - TUBULAR ADENOMA. - NO HIGH GRADE DYSPLASIA OR MALIGNANCY. 3. Colon, polyp(s), sigmoid colon polyp - TUBULAR ADENOMA. - NO HIGH GRADE DYSPLASIA OR MALIGNANCY.  Colonoscopy 11/22/2017 - One 5 mm polyp in the proximal ascending colon, removed with a cold biopsy forceps. Resected and retrieved. - Rectal varices. - Non-bleeding internal hemorrhoids. DIAGNOSIS:  A.  COLON POLYP X 1, ASCENDING; COLD BIOPSY:  - TUBULAR ADENOMA.  - NEGATIVE FOR HIGH-GRADE DYSPLASIA AND MALIGNANCY.   Past Medical History:  Diagnosis Date   Acute upper GI bleed 07/12/2017   Allergy    Arthritis    Asthma    Colon polyp    COVID-19 virus infection    Fatty liver    GERD (gastroesophageal reflux disease)    Hyperlipidemia    Hypertension    Motion sickness    boats   Pelvic fracture ([facility]) 01/31/2018   Upper respiratory infection 03/17/2016    Past Surgical History:  Procedure Laterality Date   ABDOMINAL HYSTERECTOMY  1978   COLONOSCOPY WITH PROPOFOL N/A 02/01/2015   Procedure: COLONOSCOPY WITH PROPOFOL;  Surgeon: Lucilla Lame, MD;  Location: [facility];  Service: Endoscopy;  Laterality: N/A;  Diabetic - oral meds   COLONOSCOPY WITH PROPOFOL N/A 11/22/2017   Procedure: COLONOSCOPY WITH PROPOFOL;  Surgeon: Lin Landsman, MD;  Location: [facility] ENDOSCOPY;  Service:  Gastroenterology;  Laterality: N/A;   ESOPHAGOGASTRODUODENOSCOPY (EGD) WITH PROPOFOL N/A 07/12/2017   Procedure: ESOPHAGOGASTRODUODENOSCOPY (EGD) WITH PROPOFOL;  Surgeon: Lin Landsman, MD;  Location: [facility] ENDOSCOPY;  Service: Gastroenterology;  Laterality: N/A;   ESOPHAGOGASTRODUODENOSCOPY (EGD) WITH PROPOFOL N/A 08/16/2017   Procedure: ESOPHAGOGASTRODUODENOSCOPY (EGD) WITH PROPOFOL;  Surgeon: Lin Landsman, MD;  Location: [facility] ENDOSCOPY;  Service: Gastroenterology;  Laterality: N/A;   ESOPHAGOGASTRODUODENOSCOPY (EGD) WITH PROPOFOL N/A 09/20/2017   Procedure: ESOPHAGOGASTRODUODENOSCOPY (EGD) WITH PROPOFOL;  Surgeon: Lin Landsman, MD;  Location: [facility] ENDOSCOPY;  Service: Gastroenterology;  Laterality: N/A;   ESOPHAGOGASTRODUODENOSCOPY (EGD) WITH PROPOFOL N/A 11/01/2017   Procedure: ESOPHAGOGASTRODUODENOSCOPY (EGD) WITH PROPOFOL;  Surgeon: Lin Landsman, MD;  Location: [facility] ENDOSCOPY;  Service: Gastroenterology;  Laterality: N/A;   ESOPHAGOGASTRODUODENOSCOPY (EGD) WITH PROPOFOL N/A 01/17/2018   Procedure: ESOPHAGOGASTRODUODENOSCOPY (EGD) WITH PROPOFOL;  Surgeon: Lin Landsman, MD;  Location: [facility] ENDOSCOPY;  Service: Gastroenterology;  Laterality: N/A;   ESOPHAGOGASTRODUODENOSCOPY (EGD) WITH PROPOFOL N/A 06/20/2018   Procedure: ESOPHAGOGASTRODUODENOSCOPY (EGD) WITH PROPOFOL;  Surgeon: Lin Landsman, MD;  Location: [place];  Service: Gastroenterology;  Laterality: N/A;  EGD/ with banding ligation    IR RADIOLOGIST EVAL & MGMT  07/09/2018   POLYPECTOMY  02/01/2015   Procedure: POLYPECTOMY;  Surgeon: Lucilla Lame, MD;  Location: [facility];  Service: Endoscopy;;   TONSILLECTOMY AND ADENOIDECTOMY  Current Outpatient Medications:    ACCU-CHEK GUIDE test strip, USE TO CHECK BLOOD SUGAR DAILY, Disp: 100 each, Rfl: 3   albuterol (VENTOLIN HFA) 108 (90 Base) MCG/ACT inhaler, INHALE 1-2 PUFFS INTO THE LUNGS EVERY 6 HOURS AS NEEDED FOR WHEEZING OR SHORTNESS OF  BREATH, Disp: 18 g, Rfl: 1   Blood Glucose Monitoring Suppl (ACCU-CHEK AVIVA PLUS) w/Device KIT, To check blood sugar daily, Disp: 1 kit, Rfl: 0   cholecalciferol (VITAMIN D) 25 MCG tablet, Take 1 tablet (1,000 Units total) by mouth daily., Disp: 30 tablet, Rfl: 0   furosemide (LASIX) 40 MG tablet, Take 40 mg by mouth daily., Disp: , Rfl:    furosemide (LASIX) 80 MG tablet, Take 80 mg by mouth daily., Disp: , Rfl:    lactulose (CHRONULAC) 10 GM/15ML solution, Take 30 mLs (20 g total) by mouth 2 (two) times daily., Disp: 1892 mL, Rfl: 1   Lancets (ACCU-CHEK SOFT TOUCH) lancets, To check blood sugar daily, Disp: 100 each, Rfl: 12   levETIRAcetam (KEPPRA) 500 MG tablet, Take 1 tablet (500 mg total) by mouth 2 (two) times daily., Disp: 60 tablet, Rfl: 5   liver oil-zinc oxide (DESITIN) 40 % ointment, Apply topically as needed for irritation., Disp: 56.7 g, Rfl: 0   Magnesium 200 MG TABS, Take 1 tablet by mouth daily., Disp: , Rfl:    nadolol (CORGARD) 20 MG tablet, TAKE ONE-HALF TABLET BY MOUTH EVERY DAY, Disp: 45 tablet, Rfl: 3   nystatin (MYCOSTATIN/NYSTOP) powder, Apply topically 2 (two) times daily as needed., Disp: , Rfl:    pantoprazole (PROTONIX) 40 MG tablet, TAKE ONE (1) TABLET BY MOUTH TWO TIMES PER DAY, Disp: 90 tablet, Rfl: 1   rifaximin (XIFAXAN) 550 MG TABS tablet, Take 1 tablet (550 mg total) by mouth 2 (two) times daily., Disp: 60 tablet, Rfl: 1   spironolactone (ALDACTONE) 100 MG tablet, TAKE 1 TABLET BY MOUTH DAILY, Disp: 30 tablet, Rfl: 4   albumin human 25 % bottle, Inject 50 grams no matter what (Patient not taking: Reported on 05/24/2021), Disp: 50 mL, Rfl: 0   albumin human 25 % bottle, Inject 25 grams no matter what if more then 5L are removed then another 25 grams (Patient not taking: Reported on 06/14/2021), Disp: 50 mL, Rfl: 0   albumin human 25 % bottle, Inject 25 grams no matter what if 5L or more is removed another 25 grams, Disp: 50 mL, Rfl: 0   Family History  Problem  Relation Age of Onset   Dementia Mother    Alcohol abuse Father    Cancer Sister        lung   Lymphoma Sister    Melanoma Sister    Lymphoma Sister      Social History   Tobacco Use   Smoking status: Former    Packs/day: 0.50    Years: 6.00    Pack years: 3.00    Types: Cigarettes    Quit date: 05/08/1987    Years since quitting: 34.1   Smokeless tobacco: Never  Vaping Use   Vaping Use: Never used  Substance Use Topics   Alcohol use: Never   Drug use: Never    Allergies as of 06/22/2021 - Review Complete 06/22/2021  Allergen Reaction Noted   Amoxicillin Other (See Comments) 07/17/2018   Codeine Other (See Comments) 10/14/2014   Hydrocodone-acetaminophen Other (See Comments) 10/14/2014   Nitrofurantoin Other (See Comments) 11/05/2014   Nitrofurantoin monohyd macro  10/14/2014   Penicillins  10/14/2014  Sulfa antibiotics Other (See Comments) 10/14/2014    Review of Systems:    All systems reviewed and negative except where noted in HPI.   Physical Exam:  BP (!) 109/57 (BP Location: Left Arm, Patient Position: Sitting, Cuff Size: Normal)    Pulse 61    Temp 97.7 F (36.5 C) (Oral)    Ht 5' 5" (1.651 m)    Wt 163 lb 2 oz (74 kg)    BMI 27.15 kg/m  No LMP recorded. Patient has had a hysterectomy.  General:   Alert,  Well-developed, well-nourished, pleasant and cooperative in NAD Head:  Normocephalic and atraumatic. Eyes:  Sclera clear, no icterus.   Conjunctiva pink. Ears:  Normal auditory acuity. Nose:  No deformity, discharge, or lesions. Mouth:  No deformity or lesions,oropharynx pink & moist. Neck:  Supple; no masses or thyromegaly. Lungs:  Respirations even and unlabored.  Clear throughout to auscultation.   No wheezes, crackles, or rhonchi. No acute distress. Heart:  Regular rate and rhythm; no murmurs, clicks, rubs, or gallops. Abdomen:  Normal bowel sounds. Soft, non-tender and severely distended, dull to percussion, no hepatosplenomegaly.  No guarding or  rebound tenderness.   Rectal: Not performed Msk:  Symmetrical without gross deformities. Good, equal movement & strength bilaterally. Pulses:  Normal pulses noted. Extremities:  No clubbing, 3+ edema.  No cyanosis. Neurologic:  Alert and oriented x3;  grossly normal neurologically. Skin:  Intact without significant lesions or rashes. No jaundice. Psych:  Alert and cooperative. Normal mood and affect.  Imaging Studies: reviewed  Assessment and Plan:   Jennifer [name] is a 81 y.o. Caucasian female with metabolic syndrome, diabetes, hypertension, hyperlipidemia seen for follow-up of decompensated cirrhosis with ascites and history of variceal bleed s/p ligation.  Decompensated cirrhosis: probably secondary to NASH in the setting of metabolic syndrome Hepatitis B and C negative, received hepatitis B vaccine in 2013. Normal ferritin levels Portal hypertension: manifested as ascites, thrombocytopenia, esophageal varices Volume overload: Patient is requiring periodic therapeutic paracentesis due to recurrence of ascites, continue Lasix 80 mg daily and spironolactone 150 mg daily, continue low-sodium diet.  She refused TIPS in the past, she was deemed to be a good candidate in 2020 when she was evaluated by the interventional radiologist, Dr. Sandi Mariscal.  Today, I had a lengthy, detailed discussion with patient due to recurrent ascites and requiring frequent paracentesis, being high-dose on diuretics.  Patient agreed to undergo reevaluation for TIPS.  She is not on rifaximin Recheck BMP today 2D echo revealed grade 1 diastolic dysfunction likely secondary to volume overload Recommend therapeutic paracentesis with administration of 50 g of albumin Esophageal varices: status post banding on 07/12/2017. EGD in 08/2017, ligation performed.  EGD in 10/2017, ligation was not performed due to extensive scarring from prior banding. Continue nadolol, patient is currently taking 10 mg daily, Attempted variceal  banding in 01/2018, unsuccessful due to device malfunction. Repeat EGD in 06/2018, banding was not performed due to extensive scarring.  She was referred to IR for TIPS placement and she was deemed to be a candidate for it however, patient decided not to proceed with it.  However, today she agreed for reevaluation.  We will send referral to Dr. Sandi Mariscal Anemia/thrombocytopenia/coagulopathy: No evidence of anemia,does not have B12 or iron deficiency, mild thrombocytopenia and stable PSE: Has resolved, not on rifaximin or lactulose HCC surveillance: Up-to-date, no evidence of liver lesions based on imaging HRS: None She is immune to hepatitis A and B, received vaccinations in  2013 Received Pneumovax and prevnar  History of tubular adenomas:  Repeat surveillance colonoscopy 11/2022 based on her overall medical condition at that time   Follow up in 2-3 months   Jennifer Darby, MD

## 2021-06-23 LAB — BASIC METABOLIC PANEL
BUN/Creatinine Ratio: 16 (ref 12–28)
BUN: 16 mg/dL (ref 8–27)
CO2: 27 mmol/L (ref 20–29)
Calcium: 9.1 mg/dL (ref 8.7–10.3)
Chloride: 87 mmol/L — ABNORMAL LOW (ref 96–106)
Creatinine, Ser: 1.02 mg/dL — ABNORMAL HIGH (ref 0.57–1.00)
Glucose: 114 mg/dL — ABNORMAL HIGH (ref 70–99)
Potassium: 4.4 mmol/L (ref 3.5–5.2)
Sodium: 124 mmol/L — ABNORMAL LOW (ref 134–144)
eGFR: 55 mL/min/{1.73_m2} — ABNORMAL LOW (ref >59)

## 2021-06-28 ENCOUNTER — Encounter: Payer: Self-pay | Admitting: *Deleted

## 2021-06-28 ENCOUNTER — Telehealth: Payer: Self-pay | Admitting: Gastroenterology

## 2021-06-28 ENCOUNTER — Other Ambulatory Visit: Payer: Self-pay

## 2021-06-28 ENCOUNTER — Ambulatory Visit
Admission: RE | Admit: 2021-06-28 | Discharge: 2021-06-28 | Disposition: A | Discharge status: Home / Self Care | Payer: Medicare HMO | Source: Ambulatory Visit | Attending: Gastroenterology | Admitting: Gastroenterology

## 2021-06-28 ENCOUNTER — Other Ambulatory Visit: Payer: Self-pay | Admitting: Interventional Radiology

## 2021-06-28 DIAGNOSIS — K7581 Nonalcoholic steatohepatitis (NASH): Secondary | ICD-10-CM

## 2021-06-28 DIAGNOSIS — K766 Portal hypertension: Secondary | ICD-10-CM | POA: Diagnosis not present

## 2021-06-28 DIAGNOSIS — R188 Other ascites: Secondary | ICD-10-CM | POA: Diagnosis not present

## 2021-06-28 DIAGNOSIS — K7290 Hepatic failure, unspecified without coma: Secondary | ICD-10-CM

## 2021-06-28 DIAGNOSIS — K7460 Unspecified cirrhosis of liver: Secondary | ICD-10-CM

## 2021-06-28 HISTORY — PX: IR RADIOLOGIST EVAL & MGMT: IMG5224

## 2021-06-28 NOTE — Telephone Encounter (Signed)
Pt is requesting results of recent bloodwork

## 2021-06-28 NOTE — Telephone Encounter (Signed)
Pt is requesting results of blood work

## 2021-06-28 NOTE — Consult Note (Signed)
Chief Complaint: TIPS evaluation   Referring Physician(s): Lin Landsman  History of Present Illness: Jennifer [name] is a 81 y.o. female with past medical history significant for hypertension, hyperlipidemia, asthma and NASH cirrhosis was initially seen for tips creation consultation on 07/09/2018.    In brief review, the patient has had a history of prior hemodynamically significant upper variceal GI bleed requiring admission in 2019, ultimately treated endoscopically.  Since that time the patient has undergone several subsequent endoscopies which have continued to demonstrate enlarged esophageal varices which were previously felt to no longer be amenable to endoscopic treatment.    The patient was formally evaluated and ultimately deemed a candidate for TIPS creation however ultimately this elective procedure was postponed secondary to the onset of the COVID-19 pandemic and she was subsequently lost to follow-up.  Fortunately, the patient has had no other episodes of bleeding since 2019.   At the time of the initial consult was performed in 2020, the patient had NOT experienced symptomatic ascites however has since developed this complication of portal venous hypertension, most recently undergoing ultrasound-guided paracentesis on 05/24/2021 yielding 8.4 L of peritoneal fluid (previous paracentesis performed 12/16/2020 yielding 3 L) and as such she has been re-referred to interventional radiology for evaluation of TIPS candidacy.    Note, the patient is currently under hospice care.    Past Medical History:  Diagnosis Date   Acute upper GI bleed 07/12/2017   Allergy    Arthritis    Asthma    Colon polyp    COVID-19 virus infection    Fatty liver    GERD (gastroesophageal reflux disease)    Hyperlipidemia    Hypertension    Motion sickness    boats   Pelvic fracture ([place]) 01/31/2018   Upper respiratory infection 03/17/2016    Past Surgical History:  Procedure  Laterality Date   ABDOMINAL HYSTERECTOMY  1978   COLONOSCOPY WITH PROPOFOL N/A 02/01/2015   Procedure: COLONOSCOPY WITH PROPOFOL;  Surgeon: Lucilla Lame, MD;  Location: [place];  Service: Endoscopy;  Laterality: N/A;  Diabetic - oral meds   COLONOSCOPY WITH PROPOFOL N/A 11/22/2017   Procedure: COLONOSCOPY WITH PROPOFOL;  Surgeon: Lin Landsman, MD;  Location: [facility] ENDOSCOPY;  Service: Gastroenterology;  Laterality: N/A;   ESOPHAGOGASTRODUODENOSCOPY (EGD) WITH PROPOFOL N/A 07/12/2017   Procedure: ESOPHAGOGASTRODUODENOSCOPY (EGD) WITH PROPOFOL;  Surgeon: Lin Landsman, MD;  Location: [facility] ENDOSCOPY;  Service: Gastroenterology;  Laterality: N/A;   ESOPHAGOGASTRODUODENOSCOPY (EGD) WITH PROPOFOL N/A 08/16/2017   Procedure: ESOPHAGOGASTRODUODENOSCOPY (EGD) WITH PROPOFOL;  Surgeon: Lin Landsman, MD;  Location: [facility] ENDOSCOPY;  Service: Gastroenterology;  Laterality: N/A;   ESOPHAGOGASTRODUODENOSCOPY (EGD) WITH PROPOFOL N/A 09/20/2017   Procedure: ESOPHAGOGASTRODUODENOSCOPY (EGD) WITH PROPOFOL;  Surgeon: Lin Landsman, MD;  Location: [facility] ENDOSCOPY;  Service: Gastroenterology;  Laterality: N/A;   ESOPHAGOGASTRODUODENOSCOPY (EGD) WITH PROPOFOL N/A 11/01/2017   Procedure: ESOPHAGOGASTRODUODENOSCOPY (EGD) WITH PROPOFOL;  Surgeon: Lin Landsman, MD;  Location: [facility] ENDOSCOPY;  Service: Gastroenterology;  Laterality: N/A;   ESOPHAGOGASTRODUODENOSCOPY (EGD) WITH PROPOFOL N/A 01/17/2018   Procedure: ESOPHAGOGASTRODUODENOSCOPY (EGD) WITH PROPOFOL;  Surgeon: Lin Landsman, MD;  Location: [facility] ENDOSCOPY;  Service: Gastroenterology;  Laterality: N/A;   ESOPHAGOGASTRODUODENOSCOPY (EGD) WITH PROPOFOL N/A 06/20/2018   Procedure: ESOPHAGOGASTRODUODENOSCOPY (EGD) WITH PROPOFOL;  Surgeon: Lin Landsman, MD;  Location: [place];  Service: Gastroenterology;  Laterality: N/A;  EGD/ with banding ligation    IR RADIOLOGIST EVAL & MGMT  07/09/2018   POLYPECTOMY  02/01/2015    Procedure:  POLYPECTOMY;  Surgeon: Lucilla Lame, MD;  Location: [place];  Service: Endoscopy;;   TONSILLECTOMY AND ADENOIDECTOMY      Allergies: Amoxicillin, Codeine, Hydrocodone-acetaminophen, Nitrofurantoin, Nitrofurantoin monohyd macro, Penicillins, and Sulfa antibiotics  Medications: Prior to Admission medications   Medication Sig Start Date End Date Taking? Authorizing Provider  ACCU-CHEK GUIDE test strip USE TO CHECK BLOOD SUGAR DAILY 12/27/20   Virginia Crews, MD  albumin human 25 % bottle Inject 50 grams no matter what Patient not taking: Reported on 05/24/2021 11/30/20   Lin Landsman, MD  albumin human 25 % bottle Inject 25 grams no matter what if more then 5L are removed then another 25 grams Patient not taking: Reported on 06/14/2021 05/23/21   Lin Landsman, MD  albumin human 25 % bottle Inject 25 grams no matter what if 5L or more is removed another 25 grams 06/22/21   Lin Landsman, MD  albuterol (VENTOLIN HFA) 108 (90 Base) MCG/ACT inhaler INHALE 1-2 PUFFS INTO THE LUNGS EVERY 6 HOURS AS NEEDED FOR WHEEZING OR SHORTNESS OF BREATH 09/28/20   Virginia Crews, MD  Blood Glucose Monitoring Suppl (ACCU-CHEK AVIVA PLUS) w/Device KIT To check blood sugar daily 03/26/20   Mar Daring, PA-C  cholecalciferol (VITAMIN D) 25 MCG tablet Take 1 tablet (1,000 Units total) by mouth daily. 06/08/20   Fritzi Mandes, MD  furosemide (LASIX) 40 MG tablet Take 40 mg by mouth daily. 05/28/21   [provider]  furosemide (LASIX) 80 MG tablet Take 80 mg by mouth daily. 04/22/21   [provider]  lactulose (CHRONULAC) 10 GM/15ML solution Take 30 mLs (20 g total) by mouth 2 (two) times daily. 05/31/20   Eugenie Filler, MD  Lancets (ACCU-CHEK SOFT [facility]) lancets To check blood sugar daily 12/12/18   Mar Daring, PA-C  levETIRAcetam (KEPPRA) 500 MG tablet Take 1 tablet (500 mg total) by mouth 2 (two) times daily. 01/13/21   Virginia Crews, MD  liver oil-zinc oxide (DESITIN) 40 % ointment Apply topically as needed for irritation. 05/31/20   Eugenie Filler, MD  Magnesium 200 MG TABS Take 1 tablet by mouth daily.    [provider]  nadolol (CORGARD) 20 MG tablet TAKE ONE-HALF TABLET BY MOUTH EVERY DAY 01/04/21   Bacigalupo, Dionne Bucy, MD  nystatin (MYCOSTATIN/NYSTOP) powder Apply topically 2 (two) times daily as needed. 03/14/21   [provider]  pantoprazole (PROTONIX) 40 MG tablet TAKE ONE (1) TABLET BY MOUTH TWO TIMES PER DAY 04/30/21   Gwyneth Sprout, FNP  rifaximin (XIFAXAN) 550 MG TABS tablet Take 1 tablet (550 mg total) by mouth 2 (two) times daily. 05/31/20   Eugenie Filler, MD  spironolactone (ALDACTONE) 100 MG tablet TAKE 1 TABLET BY MOUTH DAILY 06/13/21   Vanga, Tally Due, MD     Family History  Problem Relation Age of Onset   Dementia Mother    Alcohol abuse Father    Cancer Sister        lung   Lymphoma Sister    Melanoma Sister    Lymphoma Sister     Social History   Socioeconomic History   Marital status: Widowed    Spouse name: Not on file   Number of children: 2   Years of education: Not on file   Highest education level: Some college, no degree  Occupational History   Occupation: retired  Tobacco Use   Smoking status: Former    Packs/day: 0.50  Years: 6.00    Pack years: 3.00    Types: Cigarettes    Quit date: 05/08/1987    Years since quitting: 34.1   Smokeless tobacco: Never  Vaping Use   Vaping Use: Never used  Substance and Sexual Activity   Alcohol use: Never   Drug use: Never   Sexual activity: Not on file  Other Topics Concern   Not on file  Social History Narrative   Not on file   Social Determinants of Health   Financial Resource Strain: Low Risk    Difficulty of Paying Living Expenses: Not hard at all  Food Insecurity: No Food Insecurity   Worried About Charity fundraiser in the Last Year: Never true   [place] in the Last Year: Never  true  Transportation Needs: No Transportation Needs   Lack of Transportation (Medical): No   Lack of Transportation (Non-Medical): No  Physical Activity: Inactive   Days of Exercise per Week: 0 days   Minutes of Exercise per Session: 0 min  Stress: No Stress Concern Present   Feeling of Stress : Not at all  Social Connections: Moderately Isolated   Frequency of Communication with Friends and Family: More than three times a week   Frequency of Social Gatherings with Friends and Family: Twice a week   Attends Religious Services: More than 4 times per year   Active Member of [employer] or Organizations: No   Attends Archivist Meetings: Never   Marital Status: Widowed    ECOG Status: 2 - Symptomatic, <50% confined to bed  Review of Systems  Review of Systems: A 12 point ROS discussed and pertinent positives are indicated in the HPI above.  All other systems are negative.  Physical Exam No direct physical exam was performed (except for noted visual exam findings with Video Visits).   Vital Signs: There were no vitals taken for this visit.  Imaging:  Personal review of CT scan the abdomen pelvis performed 05/14/2020 demonstrates nodularity of the hepatic contour with asymmetric atrophy of the right lobe of the liver.  The portal venous system appears patent however the right portal venous system appears somewhat atretic.  No discrete worrisome hepatic lesions.  There is a small volume intra-abdominal ascites with partial recanalization of the periumbilical veins mild splenomegaly.  Mild diffuse gallbladder wall thickening with associated cholelithiasis favored to be the sequela of chronic cholecystitis.    Labs:  CBC: Recent Labs    07/08/20 1524 07/26/20 1015 01/24/21 0900 05/24/21 1357  WBC 4.6 2.6* 2.9* 3.5  HGB 12.6 12.1 11.5* 11.0*  HCT 36.6 36.1 32.3* 32.0*  PLT 96* 67* 55* 72*    COAGS: Recent Labs    07/26/20 1015 01/24/21 0900 05/24/21 1357  INR 1.3*  1.3* 1.2  APTT 31 30  --     BMP: Recent Labs    07/22/20 1146 07/26/20 1015 08/09/20 0924 12/22/20 1026 01/11/21 1450 01/24/21 0900 05/24/21 1357 06/22/21 1400  NA 129* 128* 131*   < > 136 126* 131* 124*  K 4.8 4.0 3.8   < > 4.0 3.9 3.6 4.4  CL 94* 92* 96*   < > 93* 87* 92* 87*  CO2 _0 < > _1 GLUCOSE 144* 205* 123*   < > 86 108* 120* 114*  BUN _2 < > _3 CALCIUM 9.1 9.0 8.6*   < > 9.4  9.1 9.0 9.1  CREATININE 0.79 0.92 0.81   < > 1.01* 1.01* 1.02* 1.02*  GFRNONAA >60 >60 >60  --   --  56*  --   --    < > = values in this interval not displayed.    LIVER FUNCTION TESTS: Recent Labs    07/15/20 0835 07/26/20 1015 01/24/21 0900 05/24/21 1357  BILITOT 1.9* 1.7* 2.2* 1.2  AST _0 ALT _1 ALKPHOS 76 71 66 90  PROT 6.4* 6.4* 6.6 6.0  ALBUMIN 3.2* 3.1* 3.4* 3.2*     Cardiac echo performed 05/30/2020 demonstrates left ventricular ejection fraction of 60 - 65% with normal right ventricular systolic function and size.  Note is made of mildly elevated pulmonary arterial systolic pressure.  The inferior vena cava is noted to mildly dilated with under 50% respiratory variability suggestive of right atrial pressure of 15 mmHg.  Assessment and Plan:  Jennifer [name] is a 81 y.o. female with past medical history significant for hypertension, hyperlipidemia, asthma and NASH cirrhosis was initially seen for tips creation consultation on 07/09/2018 for history of upper GI variceal bleeding requiring admission in 2019, ultimately treated endoscopically.   The patient was formally evaluated and ultimately deemed a candidate for TIPS creation however ultimately this elective procedure was postponed secondary to the onset of the COVID-19 pandemic and she was subsequently lost to follow-up.  Fortunately, the patient has had no other episodes of bleeding since 2019.   The patient has been re-referred for TIPS evaluation as she has since  developed symptomatic ascites, however the patient is currently under hospice care.    Calculated Na MELD score based on most recent laboratory values - 20  Creatinine - 1.02 (2/15); bilirubin - 1.2 (1/17) sodium - 124 (2/15); INR - 1.2 (1/17)  Personal review of CT scan the abdomen pelvis performed 05/14/2020 demonstrates nodularity of the hepatic contour with asymmetric atrophy of the right lobe of the liver.  The portal venous system appears patent however the right portal venous system appears somewhat atretic.  No discrete worrisome hepatic lesions.  There is a small volume intra-abdominal ascites with partial recanalization of the periumbilical veins mild splenomegaly.  Mild diffuse gallbladder wall thickening with associated cholelithiasis favored to be the sequela of chronic cholecystitis.   Repeat conversations were held with the patient regarding the potential benefits and risks of TIPS creation, including that the procedure requires an extensive workup including repeat cardiac echo, BRT0 protocol CT scan.  I explained the procedure is performed with general anesthesia and requires an overnight admission to the hospital for continued observation.  I explained that one of the more significant complications associated with the procedure is postprocedural confusion/hepatic encephalopathy but if successful, be TIPS could result in significant reduction and or elimination of her symptomatic ascites.   As the patient is also on hospice, prolonged conversations were also held with the patient regarding potential benefits and risks of tunneled peritoneal catheter placement.  I explained that this procedure is reserved solely for those patients on hospice care.  I explained that the catheter will not deal with the cause of her symptomatic ascites however would allow her to better manage the ascites at home as she will be able to drain the ascitic fluid whenever she asymptomatic.  I explained that this  procedure is performed with conscious sedation at an outpatient basis does not required the exhaustive workup necessary for elective TIPS creation.   Following  the above and detailed conversation, the patient wishes to pursued tunneled peritoneal catheter placement for palliative purposes.    Given the patient's advanced age, multiple medical comorbidities and current hospice status, I feel that this is a very reasonable decision, especially given the fact that she has not experienced an upper GI bleed in the last 3 years.   As such, we will coordinate for the patient to undergo peritoneal tunneled peritoneal drainage catheter placement this coming Friday, 2/24, in lieu of her pre-existing appointment for a standard paracentesis.   PLAN: - Proceed with placement of a tunneled peritoneal drain for palliative purposes.  The tunneled peritoneal drain will be subsequently managed by the patient's home hospice services.   A copy of this report was sent to the requesting provider on this date.  Electronically Signed: Sandi Mariscal 06/28/2021, 8:08 AM   I spent a total of 30 Minutes in remote  clinical consultation, greater than 50% of which was counseling/coordinating care for symptomatic ascites.    Visit type: Audio only (telephone). Audio (no video) only due to patient's lack of internet/smartphone capability. Alternative for in-person consultation at [facility], [address] [place], [place], [place]. This visit type was conducted due to national recommendations for restrictions regarding the COVID-19 Pandemic (e.g. social distancing).  This format is felt to be most appropriate for this patient at this time.  All issues noted in this document were discussed and addressed.

## 2021-06-29 NOTE — Telephone Encounter (Signed)
Patient verbalized understanding  

## 2021-06-29 NOTE — Telephone Encounter (Signed)
Please inform her that they look stable  RV

## 2021-06-29 NOTE — Telephone Encounter (Signed)
Patient is wanting to know results of her blood work that she had done on 06/22/21

## 2021-06-30 ENCOUNTER — Other Ambulatory Visit: Payer: Self-pay | Admitting: Internal Medicine

## 2021-06-30 ENCOUNTER — Telehealth: Payer: Self-pay

## 2021-06-30 NOTE — Telephone Encounter (Signed)
Per Dr. Marius Ditch she said to drain the drain as needed and the Diagnosis R18.8 other ascites.  Faxed orders to the hospice and called and left a message for the triage nurse to make sure they have received the orders

## 2021-06-30 NOTE — Telephone Encounter (Signed)
Same day surgery sent [place] a secure chat and states: Good morning.  So I got some follow up information about setting up the Pleurex for tomorrow.   Have you or someone in your office set up with Hospice a supply order?   If not I can fax you over the Order form.    Thank you.  They faxed order to [place].

## 2021-06-30 NOTE — Telephone Encounter (Signed)
Called Jennifer [name] patient hospice nurse and he states he actually not her nurse anymore but he states he will give this information to the triage nurse and her nurse now that she is going to need the Pleur drained at home every day. He states to call the triage nurse at [phone] once the order has been faxed to [phone]

## 2021-06-30 NOTE — Telephone Encounter (Signed)
Call kendra and left a message for call back. Dr. Marius Ditch wanted me to ask if they would do IV albumin when they draw fluid off at home.

## 2021-06-30 NOTE — Telephone Encounter (Addendum)
Patient hospice [facility] nurse called back and she received the orders and everything is set up for her. She states the only questions does someone need to go drain her this weekend. Called over the [place] in same day surgery and she said they will drain some tomorrow so informed hospice nurse that Monday will be okay to check to see if she needs to have anything drain.   Hospice nurse phone number is [phone]

## 2021-06-30 NOTE — Progress Notes (Signed)
Patient on schedule for abdominal pluerx catheter placement 07/01/2021, called and spoke with patient on phone with pre procedure instructions given.made aware to be here ! 1100, NPO after 0530, and driver post procedure/recovery/discharge. Stated understanding.

## 2021-06-30 NOTE — H&P (Signed)
Chief Complaint: Patient was seen in consultation today for tunneled peritoneal catheter placement.   Referring Physician(s): Watts,John  Supervising Physician: Mir, Sharen Heck  Patient Status: [facility] - Out-pt  History of Present Illness: Jennifer [name] is an 81 y.o. female with a medical history significant for HTN and NASH cirrhosis with ascites and upper GI varices. She was hospitalized 2019 for a significant GI bleed and underwent endoscopic evaluation/banding. She was initially evaluated by IR March of 2020 for a possible TIPS creation. She was deemed a candidate for this procedure but unfortunately this elective procedure was postponed secondary to the onset of the Covid-19 pandemic. She was subsequently lost to follow up and fortunately she has had no further issues with GI bleeding.   The patient did not develop issues with recurrent symptomatic ascites until January 2023. She underwent paracentesis 05/24/21 yielding 8.4 L of fluid. She was re-referred to IR for evaluation of TIPS candidacy and she met with Dr. Pascal Lux 06/28/21. A long discussion was had with the patient about the risks/benefits of a TIPS in regards to the patient's current health and hospice status. Dr. Pascal Lux suggested a tunneled peritoneal catheter as a safer alternative to manage the recurrent ascites and the patient was in agreement with this option.  Past Medical History:  Diagnosis Date   Acute upper GI bleed 07/12/2017   Allergy    Arthritis    Asthma    Colon polyp    COVID-19 virus infection    Fatty liver    GERD (gastroesophageal reflux disease)    Hyperlipidemia    Hypertension    Motion sickness    boats   Pelvic fracture ([place]) 01/31/2018   Upper respiratory infection 03/17/2016    Past Surgical History:  Procedure Laterality Date   ABDOMINAL HYSTERECTOMY  1978   COLONOSCOPY WITH PROPOFOL N/A 02/01/2015   Procedure: COLONOSCOPY WITH PROPOFOL;  Surgeon: Lucilla Lame, MD;  Location: [place];  Service: Endoscopy;  Laterality: N/A;  Diabetic - oral meds   COLONOSCOPY WITH PROPOFOL N/A 11/22/2017   Procedure: COLONOSCOPY WITH PROPOFOL;  Surgeon: Lin Landsman, MD;  Location: [facility] ENDOSCOPY;  Service: Gastroenterology;  Laterality: N/A;   ESOPHAGOGASTRODUODENOSCOPY (EGD) WITH PROPOFOL N/A 07/12/2017   Procedure: ESOPHAGOGASTRODUODENOSCOPY (EGD) WITH PROPOFOL;  Surgeon: Lin Landsman, MD;  Location: [facility] ENDOSCOPY;  Service: Gastroenterology;  Laterality: N/A;   ESOPHAGOGASTRODUODENOSCOPY (EGD) WITH PROPOFOL N/A 08/16/2017   Procedure: ESOPHAGOGASTRODUODENOSCOPY (EGD) WITH PROPOFOL;  Surgeon: Lin Landsman, MD;  Location[facility] ENDOSCOPY;  Service: Gastroenterology;  Laterality: N/A;   ESOPHAGOGASTRODUODENOSCOPY (EGD) WITH PROPOFOL N/A 09/20/2017   Procedure: ESOPHAGOGASTRODUODENOSCOPY (EGD) WITH PROPOFOL;  Surgeon: Lin Landsman, MD;  Location: [facility] ENDOSCOPY;  Service: Gastroenterology;  Laterality: N/A;   ESOPHAGOGASTRODUODENOSCOPY (EGD) WITH PROPOFOL N/A 11/01/2017   Procedure: ESOPHAGOGASTRODUODENOSCOPY (EGD) WITH PROPOFOL;  Surgeon: Lin Landsman, MD;  Location: [facility] ENDOSCOPY;  Service: Gastroenterology;  Laterality: N/A;   ESOPHAGOGASTRODUODENOSCOPY (EGD) WITH PROPOFOL N/A 01/17/2018   Procedure: ESOPHAGOGASTRODUODENOSCOPY (EGD) WITH PROPOFOL;  Surgeon: Lin Landsman, MD;  Location: [facility] ENDOSCOPY;  Service: Gastroenterology;  Laterality: N/A;   ESOPHAGOGASTRODUODENOSCOPY (EGD) WITH PROPOFOL N/A 06/20/2018   Procedure: ESOPHAGOGASTRODUODENOSCOPY (EGD) WITH PROPOFOL;  Surgeon: Lin Landsman, MD;  Location: [place];  Service: Gastroenterology;  Laterality: N/A;  EGD/ with banding ligation    IR RADIOLOGIST EVAL & MGMT  07/09/2018   IR RADIOLOGIST EVAL & MGMT  06/28/2021   POLYPECTOMY  02/01/2015   Procedure: POLYPECTOMY;  Surgeon: Lucilla Lame, MD;  Location:  [facility];  Service: Endoscopy;;   TONSILLECTOMY AND ADENOIDECTOMY       Allergies: Amoxicillin, Codeine, Hydrocodone-acetaminophen, Nitrofurantoin, Nitrofurantoin monohyd macro, Penicillins, and Sulfa antibiotics  Medications: Prior to Admission medications   Medication Sig Start Date End Date Taking? Authorizing Provider  ACCU-CHEK GUIDE test strip USE TO CHECK BLOOD SUGAR DAILY 12/27/20   Virginia Crews, MD  albumin human 25 % bottle Inject 50 grams no matter what Patient not taking: Reported on 05/24/2021 11/30/20   Lin Landsman, MD  albumin human 25 % bottle Inject 25 grams no matter what if more then 5L are removed then another 25 grams Patient not taking: Reported on 06/14/2021 05/23/21   Lin Landsman, MD  albumin human 25 % bottle Inject 25 grams no matter what if 5L or more is removed another 25 grams 06/22/21   Lin Landsman, MD  albuterol (VENTOLIN HFA) 108 (90 Base) MCG/ACT inhaler INHALE 1-2 PUFFS INTO THE LUNGS EVERY 6 HOURS AS NEEDED FOR WHEEZING OR SHORTNESS OF BREATH 09/28/20   Virginia Crews, MD  Blood Glucose Monitoring Suppl (ACCU-CHEK AVIVA PLUS) w/Device KIT To check blood sugar daily 03/26/20   Mar Daring, PA-C  cholecalciferol (VITAMIN D) 25 MCG tablet Take 1 tablet (1,000 Units total) by mouth daily. 06/08/20   Fritzi Mandes, MD  furosemide (LASIX) 40 MG tablet Take 40 mg by mouth daily. 05/28/21   [provider]  furosemide (LASIX) 80 MG tablet Take 80 mg by mouth daily. 04/22/21   [provider]  lactulose (CHRONULAC) 10 GM/15ML solution Take 30 mLs (20 g total) by mouth 2 (two) times daily. 05/31/20   Eugenie Filler, MD  Lancets (ACCU-CHEK SOFT [facility]) lancets To check blood sugar daily 12/12/18   Mar Daring, PA-C  levETIRAcetam (KEPPRA) 500 MG tablet Take 1 tablet (500 mg total) by mouth 2 (two) times daily. 01/13/21   Virginia Crews, MD  liver oil-zinc oxide (DESITIN) 40 % ointment Apply topically as needed for irritation. 05/31/20   Eugenie Filler, MD  Magnesium 200  MG TABS Take 1 tablet by mouth daily.    [provider]  nadolol (CORGARD) 20 MG tablet TAKE ONE-HALF TABLET BY MOUTH EVERY DAY 01/04/21   Bacigalupo, Dionne Bucy, MD  nystatin (MYCOSTATIN/NYSTOP) powder Apply topically 2 (two) times daily as needed. 03/14/21   [provider]  pantoprazole (PROTONIX) 40 MG tablet TAKE ONE (1) TABLET BY MOUTH TWO TIMES PER DAY 04/30/21   Gwyneth Sprout, FNP  rifaximin (XIFAXAN) 550 MG TABS tablet Take 1 tablet (550 mg total) by mouth 2 (two) times daily. 05/31/20   Eugenie Filler, MD  spironolactone (ALDACTONE) 100 MG tablet TAKE 1 TABLET BY MOUTH DAILY 06/13/21   Vanga, Tally Due, MD     Family History  Problem Relation Age of Onset   Dementia Mother    Alcohol abuse Father    Cancer Sister        lung   Lymphoma Sister    Melanoma Sister    Lymphoma Sister     Social History   Socioeconomic History   Marital status: Widowed    Spouse name: Not on file   Number of children: 2   Years of education: Not on file   Highest education level: Some college, no degree  Occupational History   Occupation: retired  Tobacco Use   Smoking status: Former    Packs/day: 0.50    Years: 6.00  Pack years: 3.00    Types: Cigarettes    Quit date: 05/08/1987    Years since quitting: 34.1   Smokeless tobacco: Never  Vaping Use   Vaping Use: Never used  Substance and Sexual Activity   Alcohol use: Never   Drug use: Never   Sexual activity: Not on file  Other Topics Concern   Not on file  Social History Narrative   Not on file   Social Determinants of Health   Financial Resource Strain: Low Risk    Difficulty of Paying Living Expenses: Not hard at all  Food Insecurity: No Food Insecurity   Worried About Charity fundraiser in the Last Year: Never true   [place] in the Last Year: Never true  Transportation Needs: No Transportation Needs   Lack of Transportation (Medical): No   Lack of Transportation (Non-Medical): No   Physical Activity: Inactive   Days of Exercise per Week: 0 days   Minutes of Exercise per Session: 0 min  Stress: No Stress Concern Present   Feeling of Stress : Not at all  Social Connections: Moderately Isolated   Frequency of Communication with Friends and Family: More than three times a week   Frequency of Social Gatherings with Friends and Family: Twice a week   Attends Religious Services: More than 4 times per year   Active Member of [employer] or Organizations: No   Attends Archivist Meetings: Never   Marital Status: Widowed    Review of Systems: A 12 point ROS discussed and pertinent positives are indicated in the HPI above.  All other systems are negative.  Review of Systems  Constitutional:  Positive for appetite change and fatigue.  Respiratory:  Negative for cough and shortness of breath.   Cardiovascular:  Positive for leg swelling. Negative for chest pain.  Gastrointestinal:  Positive for abdominal distention. Negative for abdominal pain, diarrhea, nausea and vomiting.  Neurological:  Negative for dizziness and headaches.   Vital Signs: Pulse 67    Resp (!) 22    Ht 5' 1"  (1.549 m)    Wt 160 lb (72.6 kg)    SpO2 96%    BMI 30.23 kg/m   Physical Exam Constitutional:      General: She is not in acute distress.    Appearance: She is not ill-appearing.  HENT:     Mouth/Throat:     Mouth: Mucous membranes are moist.     Pharynx: Oropharynx is clear.  Cardiovascular:     Rate and Rhythm: Normal rate and regular rhythm.     Pulses: Normal pulses.     Heart sounds: Normal heart sounds.  Pulmonary:     Effort: Pulmonary effort is normal.     Breath sounds: Normal breath sounds.  Abdominal:     General: Bowel sounds are normal. There is distension.  Musculoskeletal:     Right lower leg: Edema present.     Left lower leg: Edema present.  Skin:    General: Skin is warm and dry.  Neurological:     Mental Status: She is alert and oriented to person, place,  and time.    Imaging: IR Radiologist Eval & Mgmt  Result Date: 06/28/2021 Please refer to notes tab for details about interventional procedure. (Op Note)   Labs:  CBC: Recent Labs    07/08/20 1524 07/26/20 1015 01/24/21 0900 05/24/21 1357  WBC 4.6 2.6* 2.9* 3.5  HGB 12.6 12.1 11.5* 11.0*  HCT 36.6  36.1 32.3* 32.0*  PLT 96* 67* 55* 72*    COAGS: Recent Labs    07/26/20 1015 01/24/21 0900 05/24/21 1357  INR 1.3* 1.3* 1.2  APTT 31 30  --     BMP: Recent Labs    07/22/20 1146 07/26/20 1015 08/09/20 0924 12/22/20 1026 01/11/21 1450 01/24/21 0900 05/24/21 1357 06/22/21 1400  NA 129* 128* 131*   < > 136 126* 131* 124*  K 4.8 4.0 3.8   < > 4.0 3.9 3.6 4.4  CL 94* 92* 96*   < > 93* 87* 92* 87*  CO2 26 27 28    < > 29 30 26 27   GLUCOSE 144* 205* 123*   < > 86 108* 120* 114*  BUN 10 13 14    < > 20 18 12 16   CALCIUM 9.1 9.0 8.6*   < > 9.4 9.1 9.0 9.1  CREATININE 0.79 0.92 0.81   < > 1.01* 1.01* 1.02* 1.02*  GFRNONAA >60 >60 >60  --   --  56*  --   --    < > = values in this interval not displayed.    LIVER FUNCTION TESTS: Recent Labs    07/15/20 0835 07/26/20 1015 01/24/21 0900 05/24/21 1357  BILITOT 1.9* 1.7* 2.2* 1.2  AST 30 30 31 28   ALT 15 15 12 12   ALKPHOS 76 71 66 90  PROT 6.4* 6.4* 6.6 6.0  ALBUMIN 3.2* 3.1* 3.4* 3.2*    TUMOR MARKERS: No results for input(s): AFPTM, CEA, CA199, CHROMGRNA in the last 8760 hours.  Assessment and Plan:  Cirrhosis with recurrent ascites; Hospice care: Jennifer [name], 81 year old female, presents today to the [facility] Interventional Radiology department for an image-guided tunneled peritoneal catheter placement.  Risks and benefits discussed with the patient including bleeding, infection, damage to adjacent structures and sepsis.  All of the patient's questions were answered, patient is agreeable to proceed. She has been NPO.   Consent signed and in chart.  Thank you for this  interesting consult.  I greatly enjoyed meeting Jennifer [name] and look forward to participating in their care.  A copy of this report was sent to the requesting provider on this date.  Electronically Signed: Soyla Dryer, AGACNP-BC [phone] 07/01/2021, 11:59 AM   I spent a total of  30 Minutes   in face to face in clinical consultation, greater than 50% of which was counseling/coordinating care for tunneled peritoneal catheter placement.

## 2021-06-30 NOTE — Progress Notes (Incomplete)
Patient on schedule for abdominal pleurx catheter placement 07/01/2021

## 2021-07-01 ENCOUNTER — Encounter: Payer: Self-pay | Admitting: Radiology

## 2021-07-01 ENCOUNTER — Ambulatory Visit
Admission: RE | Admit: 2021-07-01 | Discharge: 2021-07-01 | Disposition: A | Discharge status: Home / Self Care | Source: Ambulatory Visit | Attending: Interventional Radiology | Admitting: Interventional Radiology

## 2021-07-01 ENCOUNTER — Other Ambulatory Visit: Payer: Self-pay

## 2021-07-01 ENCOUNTER — Ambulatory Visit: Admission: RE | Admit: 2021-07-01 | Payer: Medicare HMO | Source: Ambulatory Visit

## 2021-07-01 DIAGNOSIS — M7989 Other specified soft tissue disorders: Secondary | ICD-10-CM | POA: Diagnosis not present

## 2021-07-01 DIAGNOSIS — K7469 Other cirrhosis of liver: Secondary | ICD-10-CM | POA: Diagnosis not present

## 2021-07-01 DIAGNOSIS — I8510 Secondary esophageal varices without bleeding: Secondary | ICD-10-CM | POA: Insufficient documentation

## 2021-07-01 DIAGNOSIS — K7460 Unspecified cirrhosis of liver: Secondary | ICD-10-CM | POA: Diagnosis not present

## 2021-07-01 DIAGNOSIS — R140 Abdominal distension (gaseous): Secondary | ICD-10-CM | POA: Insufficient documentation

## 2021-07-01 DIAGNOSIS — K7581 Nonalcoholic steatohepatitis (NASH): Secondary | ICD-10-CM | POA: Insufficient documentation

## 2021-07-01 DIAGNOSIS — I10 Essential (primary) hypertension: Secondary | ICD-10-CM | POA: Insufficient documentation

## 2021-07-01 DIAGNOSIS — K7290 Hepatic failure, unspecified without coma: Secondary | ICD-10-CM | POA: Diagnosis not present

## 2021-07-01 DIAGNOSIS — R188 Other ascites: Secondary | ICD-10-CM | POA: Insufficient documentation

## 2021-07-01 HISTORY — PX: IR PERC TUN PERIT CATH WO PORT S&I /IMAG: IMG2327

## 2021-07-01 MED ORDER — VANCOMYCIN HCL IN DEXTROSE 1-5 GM/200ML-% IV SOLN
1000.0000 mg | INTRAVENOUS | Status: DC
Start: 2021-07-01 — End: 2021-07-02
  Filled 2021-07-01 (×2): qty 200

## 2021-07-01 MED ORDER — FENTANYL CITRATE (PF) 100 MCG/2ML IJ SOLN
INTRAMUSCULAR | Status: AC | PRN
Start: 2021-07-01 — End: 2021-07-01
  Administered 2021-07-01: 50 ug via INTRAVENOUS

## 2021-07-01 MED ORDER — MIDAZOLAM HCL 2 MG/2ML IJ SOLN
INTRAMUSCULAR | Status: AC | PRN
  Administered 2021-07-01 (×2): .5 mg via INTRAVENOUS

## 2021-07-01 MED ORDER — ALBUMIN HUMAN 25 % IV SOLN
25.0000 g | Freq: Once | INTRAVENOUS | Status: AC
Start: 2021-07-01 — End: 2021-07-01
  Administered 2021-07-01: 25 g via INTRAVENOUS
  Filled 2021-07-01: qty 100

## 2021-07-01 MED ORDER — FENTANYL CITRATE (PF) 100 MCG/2ML IJ SOLN
INTRAMUSCULAR | Status: AC
  Filled 2021-07-01: qty 2

## 2021-07-01 MED ORDER — SODIUM CHLORIDE 0.9 % IV SOLN
Freq: Once | INTRAVENOUS | Status: DC
Start: 2021-07-01 — End: 2021-07-02
  Filled 2021-07-01: qty 1000

## 2021-07-01 MED ORDER — SODIUM CHLORIDE 0.9 % IV SOLN
INTRAVENOUS | Status: DC
  Filled 2021-07-01: qty 1000

## 2021-07-01 MED ORDER — MIDAZOLAM HCL 2 MG/2ML IJ SOLN
INTRAMUSCULAR | Status: AC
  Filled 2021-07-01: qty 2

## 2021-07-01 MED ORDER — ALBUMIN HUMAN 25 % IV SOLN
INTRAVENOUS | Status: AC
  Filled 2021-07-01: qty 100

## 2021-07-01 NOTE — Progress Notes (Signed)
Sitting bp, slight dizziness when initially positioned sitting on side of bed but resolved less than a minute she has this happen at home too.

## 2021-07-01 NOTE — Progress Notes (Signed)
Lying BP. Dr Dwaine Gale [facility] discharge if patient stands without complications or extreme dizziness.

## 2021-07-01 NOTE — Procedures (Signed)
Interventional Radiology Procedure Note  Procedure: US guided peritoneal pleurX placement  Indication: End Stage Liver Disease.  Recurrent Ascites.  Findings: Please refer to procedural dictation for full description.  Complications: None  EBL: < 10 mL  Miachel Roux, MD [phone]

## 2021-07-01 NOTE — Progress Notes (Signed)
Standing bp, denies dizziness

## 2021-07-01 NOTE — OR Nursing (Signed)
Dr Mir notified of low BP, ns bolus 100 ml ordered. ( Pt on lasix daily)

## 2021-07-01 NOTE — OR Nursing (Signed)
Dr Mir notified of continued SBP of 80's 25 Gram albumin ordered and infusing. Pt and sister notified of need to stay for at least one hour due to albumin infusion and hypotension

## 2021-07-04 ENCOUNTER — Telehealth: Payer: Self-pay | Admitting: Family Medicine

## 2021-07-04 NOTE — Telephone Encounter (Signed)
Jennifer [name] with hospice called back and left a message and states the do not do IV Albumin before they drain fluid at the home

## 2021-07-04 NOTE — Telephone Encounter (Signed)
Lankin with [facility] is calling to request parameters for pt catheter. All that she has is As needed, any bp concerns, to check before or after, what is the low number.  Please advise  CB- [phone] Fax- [phone]

## 2021-07-06 ENCOUNTER — Telehealth: Payer: Self-pay | Admitting: Gastroenterology

## 2021-07-06 NOTE — Telephone Encounter (Signed)
Kendra from hospice is requesting a call back from [facility] in reference to orders for drainage at home for patient. ?

## 2021-07-06 NOTE — Telephone Encounter (Signed)
Called and left a message for call back  

## 2021-07-06 NOTE — Telephone Encounter (Signed)
Kendra left me a voicemail called her back and left a message for call back  ?

## 2021-07-07 NOTE — Telephone Encounter (Signed)
Tillie Rung called and states that they need exact orders for how often patient needs to be drained. She state that normal they draw patients 3 times a week but they need to know how often you wont her drained. They also want need to know how much you want drained they said they have to have a max and usually there patients are 1 liter at a time. Lastly the need a order when blood pressure is this do not draw any fluid off. She states normally if blood pressures are 90/60 or lower they do not like to draw fluid off. She states she just needs orders saying this.  ? ?They went to patient house on Monday and drew off 200 ML  of fluid. She had a little more fluid left but patient was screaming to stop because she was in to much pain.  She states she does not know if she will tolerate getting drain 3 times a week but needs to know how often you want her drain.  ? ?Tillie Rung said I can leave orders on her voicemail  ?

## 2021-07-07 NOTE — Telephone Encounter (Signed)
Advised Mitzi as below.  The parameters for the BP, if systolic is below 83-35 do they drain catheter or not. They will follow up with [facility] regarding catheter.  ?

## 2021-07-07 NOTE — Telephone Encounter (Signed)
Per Dr. Marius Ditch every 2 weeks is okay. Raechel Chute and gave her these orders on voicemail  ?

## 2021-07-07 NOTE — Telephone Encounter (Signed)
Recommend drainage of ascitic fluid about once a week or every 2 weeks if patient feels severe distention of her abdomen.  Agree with not draining the fluid if blood pressure is less than 90/60.  Okay with not receiving albumin ? ?Sherri Sear, MD ?

## 2021-07-07 NOTE — Telephone Encounter (Signed)
If the questions are about her pleurex catheter, that is managed by GI.  They would need to help with any parameters. I need clarification on the BP question as I'm unsure what is being asked from the recorded message ?

## 2021-07-07 NOTE — Telephone Encounter (Signed)
Called and left a message for call back  

## 2021-07-07 NOTE — Telephone Encounter (Signed)
Oh yeah, so if BP is parameter for the catheter that is through GI also ?

## 2021-07-14 ENCOUNTER — Other Ambulatory Visit: Payer: Self-pay | Admitting: Interventional Radiology

## 2021-07-14 ENCOUNTER — Telehealth: Payer: Self-pay

## 2021-07-14 DIAGNOSIS — R188 Other ascites: Secondary | ICD-10-CM

## 2021-07-14 NOTE — Telephone Encounter (Signed)
Special procedures [place] [place] [facility] Nothing to eat or drink after midnight. Called patient and informed patient of this information and left a detail message for Tillie Rung   ?

## 2021-07-14 NOTE — Telephone Encounter (Signed)
Called Tillie Rung and left a detail message for [place]. ?

## 2021-07-14 NOTE — Telephone Encounter (Signed)
Tillie Rung with Hospice left me a voicemail about patient. She said states the drain patient one time on 08/01/21 and drain 200cc of fluid. Had to stop due to patient being in a lot of pain They have not drain her since then because she has not needed to be drained. She states for the last 4 days she has had a lot of fluid coming out of the pleurex site. When they change the bandage each day it has been socked. She states today when she changed the bandage the drainage was running down stomach and leg. She is worried of the break down of her skin. She wants to know what you recommend for her. She does have a follow up appointment with you on 07/20/21  ?

## 2021-07-14 NOTE — Telephone Encounter (Signed)
I have reached out to IR, Dr. Sandi Mariscal, he will try to be seen by IR at [facility] and evaluate the site of the Pleurx catheter.  Please have patient or her nurse inform us if patient is not seen by the IR in next few days ? ?Jennifer [name] ? ? ?

## 2021-07-15 ENCOUNTER — Other Ambulatory Visit: Payer: Self-pay

## 2021-07-15 ENCOUNTER — Encounter: Payer: Self-pay | Admitting: Radiology

## 2021-07-15 ENCOUNTER — Ambulatory Visit
Admission: RE | Admit: 2021-07-15 | Discharge: 2021-07-15 | Disposition: A | Discharge status: Home / Self Care | Source: Ambulatory Visit | Attending: Interventional Radiology | Admitting: Interventional Radiology

## 2021-07-15 DIAGNOSIS — I10 Essential (primary) hypertension: Secondary | ICD-10-CM | POA: Diagnosis present

## 2021-07-15 DIAGNOSIS — K7581 Nonalcoholic steatohepatitis (NASH): Secondary | ICD-10-CM | POA: Insufficient documentation

## 2021-07-15 DIAGNOSIS — K7460 Unspecified cirrhosis of liver: Secondary | ICD-10-CM | POA: Insufficient documentation

## 2021-07-15 DIAGNOSIS — R188 Other ascites: Secondary | ICD-10-CM | POA: Diagnosis not present

## 2021-07-15 HISTORY — DX: Perforation of esophagus: K22.3

## 2021-07-15 HISTORY — PX: IR PERC TUN PERIT CATH WO PORT S&I /IMAG: IMG2327

## 2021-07-15 LAB — CBC
HCT: 36.6 % (ref 36.0–46.0)
Hemoglobin: 12.5 g/dL (ref 12.0–15.0)
MCH: 31.7 pg (ref 26.0–34.0)
MCHC: 34.2 g/dL (ref 30.0–36.0)
MCV: 92.9 fL (ref 80.0–100.0)
Platelets: 69 10*3/uL — ABNORMAL LOW (ref 150–400)
RBC: 3.94 MIL/uL (ref 3.87–5.11)
RDW: 14 % (ref 11.5–15.5)
WBC: 4 10*3/uL (ref 4.0–10.5)
nRBC: 0 % (ref 0.0–0.2)

## 2021-07-15 MED ORDER — FENTANYL CITRATE (PF) 100 MCG/2ML IJ SOLN
INTRAMUSCULAR | Status: AC | PRN
  Administered 2021-07-15 (×2): 50 ug via INTRAVENOUS

## 2021-07-15 MED ORDER — MIDAZOLAM HCL 2 MG/2ML IJ SOLN
INTRAMUSCULAR | Status: AC | PRN
Start: 2021-07-15 — End: 2021-07-15
  Administered 2021-07-15 (×2): 1 mg via INTRAVENOUS

## 2021-07-15 MED ORDER — LIDOCAINE HCL 1 % IJ SOLN
INTRAMUSCULAR | Status: AC
  Administered 2021-07-15: 25 mL
  Filled 2021-07-15: qty 20

## 2021-07-15 MED ORDER — MIDAZOLAM HCL 2 MG/2ML IJ SOLN
INTRAMUSCULAR | Status: AC
  Filled 2021-07-15: qty 4

## 2021-07-15 MED ORDER — FENTANYL CITRATE (PF) 100 MCG/2ML IJ SOLN
INTRAMUSCULAR | Status: AC
  Filled 2021-07-15: qty 2

## 2021-07-15 MED ORDER — VANCOMYCIN HCL IN DEXTROSE 1-5 GM/200ML-% IV SOLN
1000.0000 mg | Freq: Once | INTRAVENOUS | Status: AC
  Administered 2021-07-15: 1000 mg via INTRAVENOUS
  Filled 2021-07-15: qty 200

## 2021-07-15 MED ORDER — SODIUM CHLORIDE 0.9 % IV SOLN
INTRAVENOUS | Status: DC
  Filled 2021-07-15: qty 1000

## 2021-07-15 NOTE — H&P (Signed)
Chief Complaint: Leaking around the exit site of peritoneal PleurX. Patient presents for evaluation and possible intervention of the peritoneal PleurX  Referring Physician(s): Watts,John  Supervising Physician: Michaelle Birks  Patient Status: [facility] - Out-pt  History of Present Illness: Jennifer [name] is a 81 y.o. female Known to IR. History of HTN, NASH cirrhosis, upper GI varices with recurrent ascites s/p multiple paracentesis in IR. After consultation  with IR Attending Dr. Owens Shark regarding possible options it was decided that the patient should have peritoneal PleurX placement.  On 2.24.23 IR placed an peritoneal  pleurex catheter. Per noted dated 3.9.23 patient who is now in hospice care. She is experiencing Leaking around the PleurX exit site. Patient presents for peritoneal  PleurX evaluation possible intervention of the peritoneal PleurX   Past Medical History:  Diagnosis Date   Acute upper GI bleed 07/12/2017   Arthritis    Asthma    Colon polyp    COVID-19 virus infection    Fatty liver    GERD (gastroesophageal reflux disease)    Hyperlipidemia    Motion sickness    boats   Pelvic fracture ([place]) 01/31/2018   Upper respiratory infection 03/17/2016    Past Surgical History:  Procedure Laterality Date   ABDOMINAL HYSTERECTOMY  1978   COLONOSCOPY WITH PROPOFOL N/A 02/01/2015   Procedure: COLONOSCOPY WITH PROPOFOL;  Surgeon: Lucilla Lame, MD;  Location: [place];  Service: Endoscopy;  Laterality: N/A;  Diabetic - oral meds   COLONOSCOPY WITH PROPOFOL N/A 11/22/2017   Procedure: COLONOSCOPY WITH PROPOFOL;  Surgeon: Lin Landsman, MD;  Location: [facility] ENDOSCOPY;  Service: Gastroenterology;  Laterality: N/A;   ESOPHAGOGASTRODUODENOSCOPY (EGD) WITH PROPOFOL N/A 07/12/2017   Procedure: ESOPHAGOGASTRODUODENOSCOPY (EGD) WITH PROPOFOL;  Surgeon: Lin Landsman, MD;  Location: [facility] ENDOSCOPY;  Service: Gastroenterology;  Laterality: N/A;    ESOPHAGOGASTRODUODENOSCOPY (EGD) WITH PROPOFOL N/A 08/16/2017   Procedure: ESOPHAGOGASTRODUODENOSCOPY (EGD) WITH PROPOFOL;  Surgeon: Lin Landsman, MD;  Location: [facility] ENDOSCOPY;  Service: Gastroenterology;  Laterality: N/A;   ESOPHAGOGASTRODUODENOSCOPY (EGD) WITH PROPOFOL N/A 09/20/2017   Procedure: ESOPHAGOGASTRODUODENOSCOPY (EGD) WITH PROPOFOL;  Surgeon: Lin Landsman, MD;  Location: [facility] ENDOSCOPY;  Service: Gastroenterology;  Laterality: N/A;   ESOPHAGOGASTRODUODENOSCOPY (EGD) WITH PROPOFOL N/A 11/01/2017   Procedure: ESOPHAGOGASTRODUODENOSCOPY (EGD) WITH PROPOFOL;  Surgeon: Lin Landsman, MD;  Location: [facility] ENDOSCOPY;  Service: Gastroenterology;  Laterality: N/A;   ESOPHAGOGASTRODUODENOSCOPY (EGD) WITH PROPOFOL N/A 01/17/2018   Procedure: ESOPHAGOGASTRODUODENOSCOPY (EGD) WITH PROPOFOL;  Surgeon: Lin Landsman, MD;  Location: [facility] ENDOSCOPY;  Service: Gastroenterology;  Laterality: N/A;   ESOPHAGOGASTRODUODENOSCOPY (EGD) WITH PROPOFOL N/A 06/20/2018   Procedure: ESOPHAGOGASTRODUODENOSCOPY (EGD) WITH PROPOFOL;  Surgeon: Lin Landsman, MD;  Location: [place];  Service: Gastroenterology;  Laterality: N/A;  EGD/ with banding ligation    IR PERC TUN PERIT CATH WO PORT S&I /IMAG  07/01/2021   IR RADIOLOGIST EVAL & MGMT  07/09/2018   IR RADIOLOGIST EVAL & MGMT  06/28/2021   POLYPECTOMY  02/01/2015   Procedure: POLYPECTOMY;  Surgeon: Lucilla Lame, MD;  Location: [place];  Service: Endoscopy;;   TONSILLECTOMY AND ADENOIDECTOMY      Allergies: Amoxicillin, Codeine, Hydrocodone-acetaminophen, Nitrofurantoin, Nitrofurantoin monohyd macro, Penicillins, and Sulfa antibiotics  Medications: Prior to Admission medications   Medication Sig Start Date End Date Taking? Authorizing Provider  ACCU-CHEK GUIDE test strip USE TO CHECK BLOOD SUGAR DAILY 12/27/20   Virginia Crews, MD  albumin human 25 % bottle Inject 50 grams no matter what Patient  not taking: Reported  on 05/24/2021 11/30/20   Lin Landsman, MD  albumin human 25 % bottle Inject 25 grams no matter what if more then 5L are removed then another 25 grams Patient not taking: Reported on 06/14/2021 05/23/21   Lin Landsman, MD  albumin human 25 % bottle Inject 25 grams no matter what if 5L or more is removed another 25 grams 06/22/21   Lin Landsman, MD  albuterol (VENTOLIN HFA) 108 (90 Base) MCG/ACT inhaler INHALE 1-2 PUFFS INTO THE LUNGS EVERY 6 HOURS AS NEEDED FOR WHEEZING OR SHORTNESS OF BREATH 09/28/20   Virginia Crews, MD  Blood Glucose Monitoring Suppl (ACCU-CHEK AVIVA PLUS) w/Device KIT To check blood sugar daily 03/26/20   Mar Daring, PA-C  cholecalciferol (VITAMIN D) 25 MCG tablet Take 1 tablet (1,000 Units total) by mouth daily. 06/08/20   Fritzi Mandes, MD  furosemide (LASIX) 40 MG tablet Take 40 mg by mouth daily. 05/28/21   [provider]  furosemide (LASIX) 80 MG tablet Take 80 mg by mouth daily. 04/22/21   [provider]  lactulose (CHRONULAC) 10 GM/15ML solution Take 30 mLs (20 g total) by mouth 2 (two) times daily. 05/31/20   Eugenie Filler, MD  Lancets (ACCU-CHEK SOFT [name]) lancets To check blood sugar daily 12/12/18   Mar Daring, PA-C  levETIRAcetam (KEPPRA) 500 MG tablet Take 1 tablet (500 mg total) by mouth 2 (two) times daily. 01/13/21   Virginia Crews, MD  liver oil-zinc oxide (DESITIN) 40 % ointment Apply topically as needed for irritation. 05/31/20   Eugenie Filler, MD  Magnesium 200 MG TABS Take 1 tablet by mouth daily.    [provider]  nadolol (CORGARD) 20 MG tablet TAKE ONE-HALF TABLET BY MOUTH EVERY DAY 01/04/21   Bacigalupo, Dionne Bucy, MD  nystatin (MYCOSTATIN/NYSTOP) powder Apply topically 2 (two) times daily as needed. 03/14/21   [provider]  pantoprazole (PROTONIX) 40 MG tablet TAKE ONE (1) TABLET BY MOUTH TWO TIMES PER DAY 04/30/21   Gwyneth Sprout, FNP  rifaximin (XIFAXAN) 550 MG TABS  tablet Take 1 tablet (550 mg total) by mouth 2 (two) times daily. 05/31/20   Eugenie Filler, MD  spironolactone (ALDACTONE) 100 MG tablet TAKE 1 TABLET BY MOUTH DAILY Patient taking differently: 150 mg. 06/13/21   Lin Landsman, MD     Family History  Problem Relation Age of Onset   Dementia Mother    Alcohol abuse Father    Cancer Sister        lung   Lymphoma Sister    Melanoma Sister    Lymphoma Sister     Social History   Socioeconomic History   Marital status: Widowed    Spouse name: Not on file   Number of children: 2   Years of education: Not on file   Highest education level: Some college, no degree  Occupational History   Occupation: retired  Tobacco Use   Smoking status: Former    Packs/day: 0.50    Years: 6.00    Pack years: 3.00    Types: Cigarettes    Quit date: 05/08/1987    Years since quitting: 34.2   Smokeless tobacco: Never  Vaping Use   Vaping Use: Never used  Substance and Sexual Activity   Alcohol use: Never   Drug use: Never   Sexual activity: Not on file  Other Topics Concern   Not on file  Social History Narrative  Not on file   Social Determinants of Health   Financial Resource Strain: Low Risk    Difficulty of Paying Living Expenses: Not hard at all  Food Insecurity: No Food Insecurity   Worried About Charity fundraiser in the Last Year: Never true   [place] in the Last Year: Never true  Transportation Needs: No Transportation Needs   Lack of Transportation (Medical): No   Lack of Transportation (Non-Medical): No  Physical Activity: Inactive   Days of Exercise per Week: 0 days   Minutes of Exercise per Session: 0 min  Stress: No Stress Concern Present   Feeling of Stress : Not at all  Social Connections: Moderately Isolated   Frequency of Communication with Friends and Family: More than three times a week   Frequency of Social Gatherings with Friends and Family: Twice a week   Attends Religious Services: More  than 4 times per year   Active Member of [employer] or Organizations: No   Attends Archivist Meetings: Never   Marital Status: Widowed   Review of Systems: A 12 point ROS discussed and pertinent positives are indicated in the HPI above.  All other systems are negative.  Review of Systems  Constitutional:  Negative for fatigue and fever.  HENT:  Negative for congestion.   Respiratory:  Negative for cough and shortness of breath.   Gastrointestinal:  Positive for abdominal distention. Negative for abdominal pain, diarrhea, nausea and vomiting.       Leaking around the peritoneal pluerX catheter exit site.    Vital Signs: There were no vitals taken for this visit.  Physical Exam Vitals and nursing note reviewed.  Constitutional:      Appearance: She is well-developed.  HENT:     Head: Normocephalic and atraumatic.  Eyes:     Conjunctiva/sclera: Conjunctivae normal.  Cardiovascular:     Rate and Rhythm: Normal rate and regular rhythm.     Heart sounds: Normal heart sounds.  Pulmonary:     Effort: Pulmonary effort is normal.     Breath sounds: Normal breath sounds.  Abdominal:     Comments: RLQ peritoneal pleurX catheter. Slight erythema noted around exit site. No active drainage noted during the time of evaluation.   Musculoskeletal:     Cervical back: Normal range of motion.  Neurological:     Mental Status: She is alert and oriented to person, place, and time.    Imaging: IR Perc Tun Perit Cath [facility]  Result Date: 07/01/2021 INDICATION: Cirrhosis with recurrent ascites. Plan for hospice admission. EXAM: Ultrasound-guided abdominal PleurX catheter placement MEDICATIONS: Vancomycin 1 g IV ANESTHESIA/SEDATION: Fentanyl 50 mcg IV; Versed 1 mg IV Moderate Sedation Time:  10 minutes The patient was continuously monitored during the procedure by the interventional radiology nurse under my direct supervision. COMPLICATIONS: None immediate. PROCEDURE: Informed written consent  was obtained from the patient after a thorough discussion of the procedural risks, benefits and alternatives. All questions were addressed. Maximal Sterile Barrier Technique was utilized including caps, mask, sterile gowns, sterile gloves, sterile drape, hand hygiene and skin antiseptic. A timeout was performed prior to the initiation of the procedure. Ultrasound examination demonstrated moderate amount of ascites in the right lower quadrant. Ultrasound image of the ascites was obtained and placed in permanent medical record. Overlying skin was prepped and draped in the usual sterile fashion. Following local lidocaine administration, the right lower quadrant peritoneal cavity was accessed with a sheathed needle. PleurX catheter was  brought to the access site through a short subcutaneous tunnel. Peel-away sheath placed and PleurX catheter inserted through the peel-away sheath. Approximately 5,000 mL of straw-colored fluid was removed. The access site was closed with absorbable suture. The PleurX catheter was secured at the skin entry site with non-absorbable silk suture. Sterile dressing applied over the insertion site. IMPRESSION: Abdominal PleurX catheter placement as above. Electronically Signed   By: Miachel Roux M.D.   On: 07/01/2021 16:02   IR Radiologist Eval & Mgmt  Result Date: 06/28/2021 Please refer to notes tab for details about interventional procedure. (Op Note)   Labs:  CBC: Recent Labs    07/26/20 1015 01/24/21 0900 05/24/21 1357  WBC 2.6* 2.9* 3.5  HGB 12.1 11.5* 11.0*  HCT 36.1 32.3* 32.0*  PLT 67* 55* 72*    COAGS: Recent Labs    07/26/20 1015 01/24/21 0900 05/24/21 1357  INR 1.3* 1.3* 1.2  APTT 31 30  --     BMP: Recent Labs    07/22/20 1146 07/26/20 1015 08/09/20 0924 12/22/20 1026 01/11/21 1450 01/24/21 0900 05/24/21 1357 06/22/21 1400  NA 129* 128* 131*   < > 136 126* 131* 124*  K 4.8 4.0 3.8   < > 4.0 3.9 3.6 4.4  CL 94* 92* 96*   < > 93* 87* 92* 87*   CO2 26 27 28    < > 29 30 26 27   GLUCOSE 144* 205* 123*   < > 86 108* 120* 114*  BUN 10 13 14    < > 20 18 12 16   CALCIUM 9.1 9.0 8.6*   < > 9.4 9.1 9.0 9.1  CREATININE 0.79 0.92 0.81   < > 1.01* 1.01* 1.02* 1.02*  GFRNONAA >60 >60 >60  --   --  56*  --   --    < > = values in this interval not displayed.    LIVER FUNCTION TESTS: Recent Labs    07/26/20 1015 01/24/21 0900 05/24/21 1357  BILITOT 1.7* 2.2* 1.2  AST 30 31 28   ALT 15 12 12   ALKPHOS 71 66 90  PROT 6.4* 6.6 6.0  ALBUMIN 3.1* 3.4* 3.2*     Assessment and Plan:  81 y.o. female outpatient. Known to IR. History of HTN, NASH cirrhosis with recurrent ascites s/p multiple paracentesis in IR and upper GI varies. After consultation  with IR Attending Dr. Owens Shark regarding possible options it was decided that the patient should have peritoneal PleurX placement.  On 2.24.23 IR placed an peritoneal  pleurex catheter. Per noted dated 3.9.23 patient who is now in hospice care. She is experiencing pain with draining and has leaking around the PleurX exit site. Patient presents for peritoneal  PleurX evaluation possible replacement  Most recent labs from 2.15.23 shows Cr 1.02. Sodium 124. and medications are within acceptable parameters. Allergies include Codeine, nitrofurantoin, PCN. Sulfa. Patient has been NPO since midnight.  Risks and benefits discussed with the patient including bleeding, infection, damage to adjacent structures, malfunction of the catheter with need for additional procedures.  All of the patient's questions were answered, patient is agreeable to proceed. Consent signed and in chart.  Thank you for this interesting consult.  I greatly enjoyed meeting ERLEEN EGNER and look forward to participating in their care.  A copy of this report was sent to the requesting provider on this date.  Electronically Signed: Jacqualine Mau, NP 07/15/2021, 10:33 AM   I spent a total of  30 Minutes  in face to face in  clinical consultation, greater than 50% of which was counseling/coordinating care for abdominal PleurX catheter evaluation.

## 2021-07-15 NOTE — Procedures (Addendum)
Vascular and Interventional Radiology Procedure Note ? ?Patient: Jennifer [name] ?DOB: 04-Apr-1941 ?Medical Record Number: [id number] ?Note Date/Time: 07/15/21 3:53 PM  ? ?Performing Physician: Michaelle Birks, MD ?Assistant(s): None ? ?Diagnosis:  NASH Cirrhosis. Refractory ascites. Malfunctioning catheter. ? ?Procedure:  ?TUNNELED PERITONEAL DRAINAGE CATHETER REPLACEMENT ?THERAPEUTIC PARACENTESIS ? ?Anesthesia: Conscious Sedation ?Complications: None ?Estimated Blood Loss:  0 mL ?Specimens:  Sent None ? ?Findings:  ?The Right Lower quadrant peritoneal space was accessed with the pre-existing catheter. ?A new tunneled catheter was placed via a new tunnel. ?Therapeutic paracentesis was performed. 1500 mL of fluid was obtained. ? ?Intravenous Albumin: Was not administered. ? ?See detailed procedure note with images in PACS. ?The patient tolerated the procedure well without incident or complication and was returned to Recovery in stable condition.  ? ? ?Michaelle Birks, MD ?[facility] ?[facility] ? ? ?Pager. [phone] ?Clinic. [phone]  ?

## 2021-07-18 ENCOUNTER — Other Ambulatory Visit: Payer: Self-pay | Admitting: *Deleted

## 2021-07-18 DIAGNOSIS — K7460 Unspecified cirrhosis of liver: Secondary | ICD-10-CM

## 2021-07-18 DIAGNOSIS — K7290 Hepatic failure, unspecified without coma: Secondary | ICD-10-CM

## 2021-07-18 DIAGNOSIS — K7581 Nonalcoholic steatohepatitis (NASH): Secondary | ICD-10-CM

## 2021-07-18 DIAGNOSIS — D696 Thrombocytopenia, unspecified: Secondary | ICD-10-CM

## 2021-07-20 ENCOUNTER — Encounter: Payer: Self-pay | Admitting: Gastroenterology

## 2021-07-20 ENCOUNTER — Other Ambulatory Visit: Payer: Self-pay

## 2021-07-20 ENCOUNTER — Ambulatory Visit: Admitting: Gastroenterology

## 2021-07-20 VITALS — BP 92/57 | HR 75 | Temp 97.7°F | Ht 65.0 in | Wt 142.1 lb

## 2021-07-20 DIAGNOSIS — K7290 Hepatic failure, unspecified without coma: Secondary | ICD-10-CM

## 2021-07-20 DIAGNOSIS — K7460 Unspecified cirrhosis of liver: Secondary | ICD-10-CM | POA: Diagnosis not present

## 2021-07-20 NOTE — Progress Notes (Signed)
?  ?Cephas Darby, MD ?[address]  ?[address]  ?[place], [place] [address]  ?Main: [phone]  ?Fax: [phone] ? ? ? ?Gastroenterology Consultation ? ?Referring Provider:     Gwyneth Sprout, FNP ?Primary Care Physician:  Gwyneth Sprout, FNP ?Primary Gastroenterologist:  Dr. Cephas Darby ?Reason for Consultation:   Decompensated NASH Cirrhosis ?      ? HPI:   ?Jennifer [name] is a 81 y.o. female referred by Dr. Gwyneth Sprout, FNP  for consultation & management of decompensated cirrhosis manifested as recurrent ascites requiring therapeutic paracentesis. She needed therapeutic paracentesis x4 since 10/2020, latest in 12/16/2020.  I increased her diuretics to Lasix 80 mg and spironolactone 100 mg daily which helped with recurrence of ascites.  Her swelling of legs had resolved and her ascites was under control.  Her weight was almost back to baseline at 148 pounds.  ?Patient noticed progressively worsening abdominal distention within last 2 to 3 weeks.  She weighed 149 pounds based on our telephone call at 05/10/2021.  She reports that she has been watchful of her diet and also taking Lasix and spironolactone.  She reports that she has been taking Lasix 80 mg in the morning and 40 mg in the evening along with spironolactone 100 mg daily.  She reports that she has been sleeping good.  She denies any black stools, rectal bleeding, abdominal pain, nausea or vomiting, hematemesis, fever, chills. ?She called my office yesterday, made an urgent visit.  She is also scheduled to undergo therapeutic paracentesis this afternoon ? ?Follow-up visit 06/22/2021 ?Patient is here for follow-up of ascites.  Patient has recurrence of ascites with abdominal distention.  She underwent therapeutic paracentesis on 05/24/2021, 8.4 L of clear yellow fluid was removed and she received albumin.  She continues to take Lasix 80 mg daily and spironolactone 150 mg daily.  Patient has reaccumulation of fluid.  Patient is not taking  rifaximin.  She denies any symptoms of hepatic encephalopathy. ? ?Follow-up visit 07/20/2021 ?Patient is here for follow-up of cirrhosis.  Patient is currently under hospice care.  She has received Pleurx catheter to drain ascitic fluid as needed.  This has been recently replaced to the new site on 3/11 by IR.  The drain is in place and working well.  Patient denies any leakage of ascitic fluid around the drain.  She does not have any major concerns today.  She does have abdominal distention and swelling of legs, taking medications.  Patient is DNR/DNI ? ?NSAIDs: none ?  ?Antiplts/Anticoagulants/Anti thrombotics: none ?  ?GI Procedures:  ? ?Upper endoscopy 06/20/2018 ?The duodenal bulb and second portion of the duodenum were normal. ?Moderate portal hypertensive gastropathy was found in the entire examined stomach. ?The cardia and gastric fundus were normal on retroflexion. ?Four columns of non-bleeding large (> 5 mm) varices were found in the mid esophagus and in the distal esophagus,. No stigmata of ?recent bleeding were evident and no red wale signs were present. Extensive scarring from prior treatment was visible interspersed ?between varices, making the ligation technically challenging, therefore, I opted not to proceed with ligation. Evidence of partial eradication was visible. ? ? ?EGD 01/17/18 ?- Normal duodenal bulb and second portion of the duodenum. ?- Portal hypertensive gastropathy. ?- A few gastric polyps.resected, Clip (MR conditional) was placed. ?- Large (> 5 mm) esophageal varices, banding was not performed due to device malfunction. ? ?DIAGNOSIS:  ?A. STOMACH POLYPS X2, BODY; HOT SNARE:  ?- HYPERPLASTIC TYPE  GASTRIC POLYP.  ?- NEGATIVE FOR H. PYLORI, DYSPLASIA, AND MALIGNANCY.  ? ?For EGD 11/01/2017 ?- Normal duodenal bulb and second portion of the duodenum. ?- Portal hypertensive gastropathy. ?- A few gastric polyps. ?- Large (> 5 mm) esophageal varices. ?- No specimens collected. ? ?EGD  09/20/2017 ?- Normal duodenal bulb and second portion of the duodenum. ?- Portal hypertensive gastropathy. ?- Recently bleeding large (> 5 mm) esophageal varices. Incompletely eradicated. Bandedx3. ?- No specimens collected. ? ?EGD 08/16/2017 ?- Normal duodenal bulb and second portion of the duodenum. ?- Portal hypertensive gastropathy. ?- Recently bleeding large (> 5 mm) esophageal varices. Incompletely eradicated. Banded x 4. ?- No specimens collected. ? ?EGD 07/12/2017 ?- Normal duodenal bulb and second portion of the duodenum. ?- Portal hypertensive gastropathy. ?- Bleeding large (> 5 mm) esophageal varices. Incompletely eradicated. Banded. ?- No specimens collected. ? ?Colonoscopy 04/03/2005 normal ? ?Colonoscopy 02/01/2015 ?- One 8 mm polyp in the ascending colon. Resected and retrieved. Clips were placed. ?- One 4 mm polyp in the transverse colon. Resected and retrieved. ?- One 7 mm polyp in the sigmoid colon. Resected and retrieved. Clip was placed. ?- Non-bleeding internal hemorrhoids. ?1. Colon, polyp(s), ascending colon polyp ?- SESSILE SERRATED POLYP. ?- NO DYSPLASIA OR MALIGNANCY. ?2. Colon, polyp(s), transverse colon polyp ?- TUBULAR ADENOMA. ?- NO HIGH GRADE DYSPLASIA OR MALIGNANCY. ?3. Colon, polyp(s), sigmoid colon polyp ?- TUBULAR ADENOMA. ?- NO HIGH GRADE DYSPLASIA OR MALIGNANCY. ? ?Colonoscopy 11/22/2017 ?- One 5 mm polyp in the proximal ascending colon, removed with a cold biopsy forceps. Resected and retrieved. ?- Rectal varices. ?- Non-bleeding internal hemorrhoids. ?DIAGNOSIS:  ?A.  COLON POLYP X 1, ASCENDING; COLD BIOPSY:  ?- TUBULAR ADENOMA.  ?- NEGATIVE FOR HIGH-GRADE DYSPLASIA AND MALIGNANCY.  ? ?Past Medical History:  ?Diagnosis Date  ? Acute upper GI bleed 07/12/2017  ? Arthritis   ? Asthma   ? Colon polyp   ? COVID-19 virus infection   ? Esophageal rupture   ? Fatty liver   ? GERD (gastroesophageal reflux disease)   ? Hyperlipidemia   ? Motion sickness   ? boats  ? Pelvic fracture ([place])  01/31/2018  ? Upper respiratory infection 03/17/2016  ? ? ?Past Surgical History:  ?Procedure Laterality Date  ? ABDOMINAL HYSTERECTOMY  1978  ? COLONOSCOPY WITH PROPOFOL N/A 02/01/2015  ? Procedure: COLONOSCOPY WITH PROPOFOL;  Surgeon: Lucilla Lame, MD;  Location: [place];  Service: Endoscopy;  Laterality: N/A;  Diabetic - oral meds  ? COLONOSCOPY WITH PROPOFOL N/A 11/22/2017  ? Procedure: COLONOSCOPY WITH PROPOFOL;  Surgeon: Lin Landsman, MD;  Location: [facility] ENDOSCOPY;  Service: Gastroenterology;  Laterality: N/A;  ? ESOPHAGOGASTRODUODENOSCOPY (EGD) WITH PROPOFOL N/A 07/12/2017  ? Procedure: ESOPHAGOGASTRODUODENOSCOPY (EGD) WITH PROPOFOL;  Surgeon: Lin Landsman, MD;  Location: [facility] ENDOSCOPY;  Service: Gastroenterology;  Laterality: N/A;  ? ESOPHAGOGASTRODUODENOSCOPY (EGD) WITH PROPOFOL N/A 08/16/2017  ? Procedure: ESOPHAGOGASTRODUODENOSCOPY (EGD) WITH PROPOFOL;  Surgeon: Lin Landsman, MD;  Location: [facility] ENDOSCOPY;  Service: Gastroenterology;  Laterality: N/A;  ? ESOPHAGOGASTRODUODENOSCOPY (EGD) WITH PROPOFOL N/A 09/20/2017  ? Procedure: ESOPHAGOGASTRODUODENOSCOPY (EGD) WITH PROPOFOL;  Surgeon: Lin Landsman, MD;  Location: [facility] ENDOSCOPY;  Service: Gastroenterology;  Laterality: N/A;  ? ESOPHAGOGASTRODUODENOSCOPY (EGD) WITH PROPOFOL N/A 11/01/2017  ? Procedure: ESOPHAGOGASTRODUODENOSCOPY (EGD) WITH PROPOFOL;  Surgeon: Lin Landsman, MD;  Location: [facility] ENDOSCOPY;  Service: Gastroenterology;  Laterality: N/A;  ? ESOPHAGOGASTRODUODENOSCOPY (EGD) WITH PROPOFOL N/A 01/17/2018  ? Procedure: ESOPHAGOGASTRODUODENOSCOPY (EGD) WITH PROPOFOL;  Surgeon: Lin Landsman, MD;  Location: [facility] ENDOSCOPY;  Service: Gastroenterology;  Laterality: N/A;  ? ESOPHAGOGASTRODUODENOSCOPY (EGD) WITH PROPOFOL N/A 06/20/2018  ? Procedure: ESOPHAGOGASTRODUODENOSCOPY (EGD) WITH PROPOFOL;  Surgeon: Lin Landsman, MD;  Location: [facility] ENDOSCOPY;  Service: Gastroenterology;  Laterality: N/A;  EGD/  with banding ligation   ? IR PERC TUN PERIT CATH WO PORT S&I /IMAG  07/01/2021  ? IR PERC TUN PERIT CATH WO PORT S&I /IMAG  07/15/2021  ? IR RADIOLOGIST EVAL & MGMT  07/09/2018  ? IR RADIOLOGIST EVAL & MGMT  06/28/2021  ? PO

## 2021-07-20 NOTE — Patient Instructions (Signed)
Hospice will take care of labs. Patient will call us if she needs anything.  ?

## 2021-07-25 ENCOUNTER — Inpatient Hospital Stay: Payer: Medicare HMO

## 2021-07-25 ENCOUNTER — Inpatient Hospital Stay: Payer: Medicare HMO | Admitting: Internal Medicine

## 2021-07-29 ENCOUNTER — Ambulatory Visit: Payer: Medicare HMO

## 2021-08-02 ENCOUNTER — Telehealth: Payer: Self-pay

## 2021-08-02 ENCOUNTER — Telehealth: Payer: Self-pay | Admitting: Gastroenterology

## 2021-08-02 NOTE — Telephone Encounter (Signed)
Kendra with Hospice is calling because the patient and family is wanting her to reach out to [place] about the patient medications. She is currently taking Spirolactone on 115m  daily and Lasix 869mdaily. They drained 2.5 Liters from her last week. Yesterday weighed 150 pounds has trace of edema in bilateral lower extremities. No fluid in stomach and stomach is soft. Patient is declining with health and is refusing to take her medication some days  ?

## 2021-08-02 NOTE — Telephone Encounter (Signed)
Called and left a detail message for hospice nurse.  ?

## 2021-08-02 NOTE — Telephone Encounter (Signed)
-----   Message from Shelby Mattocks, [place] sent at 08/02/2021 11:12 AM EDT ----- ?Regarding: FW: Hospice ?Her number is [phone]  ?----- Message ----- ?From: Lin Landsman, MD ?Sent: 08/02/2021  11:06 AM EDT ?To: Shelby Mattocks, CMA ?Subject: Hospice                                       ? ?Caryl Pina ? ?What's the number to call Tillie Rung, hospice nurse ? ?Thanks ?RV ? ? ?

## 2021-08-02 NOTE — Telephone Encounter (Signed)
Called the hospice nurse, Tillie Rung and reiterated to her that home hospice is appropriate at this point since Ms. Sibert has been rapidly declining within the last 2 weeks.  At this point, current medications including diuretics are only prolonging her life and probably not helping since she is requiring frequent paracentesis.  I am okay to reduce the dose of diuretics.  Tillie Rung expressed understanding of my recommendations and she will relay to patient and her family members. ? ?Cephas Darby, MD ?[facility], [address] ?[address]  ?[address]  ?[place], [place] [address]  ?Main: [phone]  ?Fax: [phone] ?Pager: [phone] ? ?

## 2021-08-02 NOTE — Telephone Encounter (Signed)
Unfortunately, at this point not much can be done.  I think the best next step is to keep her comfortable by continuing hospice services and not to worry about the medications.  I will reach out to her family members ? ?RV ?

## 2021-08-08 ENCOUNTER — Telehealth: Payer: Self-pay

## 2021-08-08 NOTE — Telephone Encounter (Signed)
If bp is low hold all diuretics and blood pressure pills and have a medical person see her and assess her , resume when better at lower dose

## 2021-08-08 NOTE — Telephone Encounter (Addendum)
Amy parsion a RN with Hospice is calling because patient blood pressure today at her visit was 90/60. She is having a lot of swelling in her lower extremities she stated a 3 plus. She has swelling in the abdominal area. She did not drain her today because of her blood pressure. Wanted to know what they should do about the Lasix. She is taking Lasix 19m daily  and Lasix 465min the evening with Spirolactone daily. ? ? ?CB [phone]

## 2021-08-08 NOTE — Telephone Encounter (Signed)
Amy states she is taking over care for the patient from [place]. She states she was also breathing heavy and would not really respond to her today. She mention to the son giving her some Morphine but the son refused and said that would kill his mom. She called the patient daughter and she states she knows she is going down hill and it will not be long. She states she will talk to her brother. Amy Verbalized understanding of Dr. Vicente Males recommendations. She asked if the family agreed to Morphine could her provider with Hospice order and I said yes.  ?

## 2021-08-15 ENCOUNTER — Telehealth: Payer: Self-pay

## 2021-08-15 NOTE — Telephone Encounter (Signed)
-----   Message from Secundino Ginger sent at 08/15/2021  8:54 AM EDT ----- ?Regarding: FYI...cancel appt ?Ledell Noss called and got answering service. He cancelled this patient's appt on 4/12 w/ Dr B because patient can't get out of bed. I called him and he did not want to reschedule. Patient's appt is cancelled. ? ?

## 2021-08-15 NOTE — Telephone Encounter (Signed)
It is documented in the patient's chart that she is now under Hospice care at home.  Hospice orders scanned in chart signed by Dr. Brita Romp at [facility]. ?

## 2021-08-17 ENCOUNTER — Ambulatory Visit: Payer: Medicare HMO | Admitting: Internal Medicine

## 2021-08-17 ENCOUNTER — Other Ambulatory Visit: Payer: Medicare HMO

## 2021-08-26 ENCOUNTER — Ambulatory Visit: Payer: Medicare HMO

## 2021-09-23 ENCOUNTER — Ambulatory Visit: Payer: Medicare HMO

## 2021-10-21 ENCOUNTER — Ambulatory Visit: Payer: Medicare HMO

## 2021-11-18 ENCOUNTER — Ambulatory Visit: Payer: Medicare HMO

## 2021-11-22 ENCOUNTER — Other Ambulatory Visit: Payer: Self-pay | Admitting: Family Medicine

## 2021-12-02 ENCOUNTER — Other Ambulatory Visit: Payer: Self-pay

## 2021-12-02 NOTE — Patient Outreach (Signed)
[facility]) Care Management  12/02/2021  KAORI JUMPER 05-10-1940 [id number]   Telephone Screen    Outreach call to patient to introduce [facility] services and assess care needs as part of benefit of PCP office and insurance plan. . No answer. RN CM left HIPAA compliant voicemail message along with contact info. Noted in last  EMR  note patient active with hospice.      Plan: RN CM will make outreach attempt to patient within 4 business days. RN CM will send unsuccessful outreach letter to patient.   Enzo Montgomery, RN,BSN,CCM [place] Management Telephonic Care Management Coordinator Direct Phone: [phone] Toll Free: [phone] Fax: [phone]

## 2021-12-06 ENCOUNTER — Other Ambulatory Visit: Payer: Self-pay

## 2021-12-06 NOTE — Patient Outreach (Signed)
[facility]) Care Management  12/06/2021  Jennifer [name] 1940-10-29 [id number]   Telephone Screen       Outreach call to patient to introduce [facility] services and assess care needs as part of benefit of PCP office and insurance plan. No answer at present.    Plan: RN CM will make outreach attempt to patient within 4 business days.  Enzo Montgomery, RN,BSN,CCM [place] Management Telephonic Care Management Coordinator Direct Phone: [phone] Toll Free: [phone] Fax: [phone]

## 2021-12-08 ENCOUNTER — Other Ambulatory Visit: Payer: Self-pay

## 2021-12-08 NOTE — Patient Outreach (Signed)
[facility]) Care Management  12/08/2021  AVRIANNA SMART [birth year] [id number]   Telephone Screen       Outreach call to patient to introduce [facility] services and assess care needs as part of benefit of PCP office and insurance plan. Spoke with daughter [name]. She confirms that patient remains active with [facility]. She voices that patient is now bedridden and doing fair. She denies any needs or concerns. She is aware that hospice is available 24/7 to assist with patient's medical needs.     Plan: RN CM will close case.   Enzo Montgomery, RN,BSN,CCM [place] Management Telephonic Care Management Coordinator Direct Phone: [phone] Toll Free: [phone] Fax: [phone]

## 2021-12-16 ENCOUNTER — Ambulatory Visit: Payer: Medicare HMO

## 2022-01-06 DEATH — deceased

## 2022-01-13 ENCOUNTER — Ambulatory Visit: Payer: Medicare HMO

## 2022-02-10 ENCOUNTER — Ambulatory Visit: Payer: Medicare HMO

## 2022-03-10 ENCOUNTER — Ambulatory Visit: Payer: Medicare HMO

## 2022-04-07 ENCOUNTER — Ambulatory Visit: Payer: Medicare HMO
